# Patient Record
Sex: Female | Born: 1955 | Race: White | Hispanic: No | Marital: Married | Smoking: Former smoker
Health system: Southern US, Community
[De-identification: ages and names within clinical notes are randomized; demographics above are authoritative.]

## PROBLEM LIST (undated history)

## (undated) DIAGNOSIS — K279 Peptic ulcer, site unspecified, unspecified as acute or chronic, without hemorrhage or perforation: Secondary | ICD-10-CM

## (undated) DIAGNOSIS — A0472 Enterocolitis due to Clostridium difficile, not specified as recurrent: Secondary | ICD-10-CM

## (undated) DIAGNOSIS — M419 Scoliosis, unspecified: Secondary | ICD-10-CM

## (undated) DIAGNOSIS — K255 Chronic or unspecified gastric ulcer with perforation: Secondary | ICD-10-CM

## (undated) DIAGNOSIS — M1990 Unspecified osteoarthritis, unspecified site: Secondary | ICD-10-CM

## (undated) DIAGNOSIS — I824Z3 Acute embolism and thrombosis of unspecified deep veins of distal lower extremity, bilateral: Secondary | ICD-10-CM

## (undated) DIAGNOSIS — G10 Huntington's disease: Secondary | ICD-10-CM

## (undated) HISTORY — DX: Acute embolism and thrombosis of unspecified deep veins of distal lower extremity, bilateral: I82.4Z3

## (undated) HISTORY — PX: EYE SURGERY: SHX253

## (undated) HISTORY — DX: Enterocolitis due to Clostridium difficile, not specified as recurrent: A04.72

## (undated) HISTORY — DX: Peptic ulcer, site unspecified, unspecified as acute or chronic, without hemorrhage or perforation: K27.9

## (undated) HISTORY — DX: Huntington's disease: G10

## (undated) HISTORY — DX: Chronic or unspecified gastric ulcer with perforation: K25.5

## (undated) HISTORY — PX: COLONOSCOPY W/ POLYPECTOMY: SHX1380

## (undated) HISTORY — PX: BACK SURGERY: SHX140

## (undated) HISTORY — PX: OTHER SURGICAL HISTORY: SHX169

---
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
## 1975-04-10 HISTORY — PX: OOPHORECTOMY: SHX86

## 1984-04-09 HISTORY — PX: TUBAL LIGATION: SHX77

## 1985-04-09 HISTORY — PX: BREAST SURGERY: SHX581

## 2003-01-08 LAB — HM COLONOSCOPY: HM Colonoscopy: NORMAL

## 2003-04-10 HISTORY — PX: OTHER SURGICAL HISTORY: SHX169

## 2004-12-29 ENCOUNTER — Emergency Department (HOSPITAL_COMMUNITY): Admission: EM | Admit: 2004-12-29 | Discharge: 2004-12-29 | Payer: Self-pay | Admitting: Emergency Medicine

## 2005-04-09 LAB — CONVERTED CEMR LAB: Pap Smear: ABNORMAL

## 2009-01-27 ENCOUNTER — Ambulatory Visit: Payer: Self-pay | Admitting: Family Medicine

## 2009-01-27 DIAGNOSIS — H9190 Unspecified hearing loss, unspecified ear: Secondary | ICD-10-CM | POA: Insufficient documentation

## 2009-01-27 DIAGNOSIS — H6120 Impacted cerumen, unspecified ear: Secondary | ICD-10-CM | POA: Insufficient documentation

## 2009-02-08 ENCOUNTER — Encounter: Payer: Self-pay | Admitting: *Deleted

## 2009-04-22 ENCOUNTER — Ambulatory Visit: Payer: Self-pay | Admitting: Family Medicine

## 2009-04-22 DIAGNOSIS — J069 Acute upper respiratory infection, unspecified: Secondary | ICD-10-CM | POA: Insufficient documentation

## 2009-04-22 DIAGNOSIS — J029 Acute pharyngitis, unspecified: Secondary | ICD-10-CM | POA: Insufficient documentation

## 2009-04-22 LAB — CONVERTED CEMR LAB: Rapid Strep: NEGATIVE

## 2010-05-09 NOTE — Assessment & Plan Note (Signed)
Summary: FLU LIKE SYMPTOMS/PER DR BURCHETTE/CJR   Vital Signs:  Patient profile:   55 year old female Menstrual status:  postmenopausal Temp:     98.8 degrees F oral BP sitting:   160 / 90  (left arm) Cuff size:   regular  Vitals Entered By: Sid Falcon LPN (April 22, 2009 3:33 PM) CC: Sore throat, cough, congestion   History of Present Illness: Onset yesterday flulike symptoms body aches, productive cough and sore throat. No definite fever. Husband had somewhat similar symptoms. Patient denies any nausea, vomiting or, or abdominal pain. No rashes. Has not tried any over-the-counter cough medications. Ex-smoker. Allergy to penicillin  Allergies: 1)  Penicillin V Potassium (Penicillin V Potassium) 2)  Ferrous Sulfate (Ferrous Sulfate) 3)  * Bee Stings  Past History:  Past Medical History: Last updated: 01/27/2009 UTI Hx endometriosis  Past Surgical History: Last updated: 01/27/2009 Breast biopsy 1989 Appendectomy 1963 Tonsillectomy 1958 Left ovary removed 1977 (Endometriosis) Oral surgery implants 2009-2010  Review of Systems      See HPI  Physical Exam  General:  Well-developed,well-nourished,in no acute distress; alert,appropriate and cooperative throughout examination Ears:  minimal cerumen in ear canals otherwise clear Nose:  External nasal examination shows no deformity or inflammation. Nasal mucosa are pink and moist without lesions or exudates. Mouth:  Oral mucosa and oropharynx without lesions or exudates.  Teeth in good repair. Neck:  No deformities, masses, or tenderness noted. Lungs:  Normal respiratory effort, chest expands symmetrically. Lungs are clear to auscultation, no crackles or wheezes. Heart:  normal rate, regular rhythm, and no murmur.   Skin:  no rash   Impression & Recommendations:  Problem # 1:  VIRAL URI (ICD-465.9) Rapid strep neg.  Plenty of fluids and rest.  Other Orders: Rapid Strep ([id number])  Patient Instructions: 1)  Get  plenty of rest, drink lots of clear liquids, and use Tylenol or Ibuprofen for fever and comfort. Return in 7-10 days if you're not better: sooner if you'er feeling worse.   Laboratory Results    Other Tests  Rapid Strep: negative Comments: Joanne Chars CMA  April 22, 2009 3:47 PM

## 2010-05-09 NOTE — Assessment & Plan Note (Signed)
Summary: EST [place]//CCM   Vital Signs:  Patient profile:   55 year old female Menstrual status:  postmenopausal Height:      64 inches Weight:      148 pounds BMI:     25.50 Temp:     98.2 degrees F oral Pulse rate:   72 / minute Pulse rhythm:   regular Resp:     12 per minute BP sitting:   169 / 90  (left arm) Cuff size:   regular  Vitals Entered By: Sid Falcon LPN (January 27, 2009 1:21 PM)  Serial Vital Signs/Assessments:  Time      Position  BP       Pulse  Resp  Temp     By                     140/80                         Evelena Peat MD  CC: New pt to establish, from [place], hearing problems     Menstrual Status postmenopausal Last PAP Result abnormal   History of Present Illness: New patient visit. Maj. issue is decreased hearing bilaterally past 3 weeks. Did have some mild vertigo a few weeks ago that is clear. Never had any associated nausea or vomiting. Had some recent dental work with implants right upper molars. Hearing changes seem to correlate with that surgery.  Past medical history reviewed. History of endometriosis. Previous left ovariectomy secondary endometriosis. Also had prior appendectomy and negative breast biopsy in 1989. Allergic to penicillin and sulfa. No regular medications.  Patient has previously smoked but not for several years. Occasional alcohol use. She is married. Works in Research officer, political party.  Preventive Screening-Counseling & Management  Alcohol-Tobacco     Smoking Status: quit     Year Quit: 2000  Allergies (verified): 1)  Penicillin V Potassium (Penicillin V Potassium) 2)  Ferrous Sulfate (Ferrous Sulfate) 3)  * Bee Stings  Past History:  Family History: Last updated: 01/27/2009 Family History of Arthritis Family History of Prostate CA  Bronchial cancer Family history heart disease  Social History: Last updated: 01/27/2009 Occupation:  Counsellor Married Alcohol use-yes Past smoker, quit  2000  Risk Factors: Smoking Status: quit (01/27/2009)  Past Medical History: UTI Hx endometriosis  Past Surgical History: Breast biopsy 1989 Appendectomy 1963 Tonsillectomy 1958 Left ovary removed 1977 (Endometriosis) Oral surgery implants 2009-2010  Family History: Family History of Arthritis Family History of Prostate CA  Bronchial cancer Family history heart disease  Social History: Occupation:  Counsellor Married Alcohol use-yes Past smoker, quit 2000 Smoking Status:  quit Occupation:  employed  Review of Systems  The patient denies anorexia, fever, weight loss, weight gain, vision loss, hoarseness, chest pain, dyspnea on exertion, prolonged cough, and headaches.    Physical Exam  General:  Well-developed,well-nourished,in no acute distress; alert,appropriate and cooperative throughout examination Head:  Normocephalic and atraumatic without obvious abnormalities. No apparent alopecia or balding. Ears:  cerumen impaction left canal. Right ear canal and eardrum are normal in appearance Nose:  External nasal examination shows no deformity or inflammation. Nasal mucosa are pink and moist without lesions or exudates. Mouth:  Oral mucosa and oropharynx without lesions or exudates.  Teeth in good repair. Neck:  No deformities, masses, or tenderness noted. Lungs:  Normal respiratory effort, chest expands symmetrically. Lungs are clear to auscultation, no crackles or wheezes. Heart:  Normal rate and regular rhythm. S1 and S2 normal without gallop, murmur, click, rub or other extra sounds.   Impression & Recommendations:  Problem # 1:  UNSPECIFIED HEARING LOSS (ICD-389.9) patient noted prompt improvement in hearing after irrigation left canal. Observe for now. If further concerns audiometry screening.  Problem # 2:  IMPACTED CERUMEN (ICD-380.4) left ear canal irrigated.  Preventive Care Screening  Pap Smear:    Date:  04/09/2005    Results:  abnormal    Last Tetanus Booster:    Date:  04/09/2002    Results:  Historical   Colonoscopy:    Date:  04/09/2002    Results:  normal

## 2010-05-09 NOTE — Miscellaneous (Signed)
Summary: Immunization Entry   Immunization History:  Pneumovax Immunization History:    Pneumovax:  historical (02/07/2006)   Immunization History:  Pneumovax Immunization History:    Pneumovax:  historical (02/07/2006)

## 2010-10-09 ENCOUNTER — Other Ambulatory Visit: Payer: Self-pay | Admitting: Orthopedic Surgery

## 2010-10-09 DIAGNOSIS — M545 Low back pain, unspecified: Secondary | ICD-10-CM

## 2010-10-10 ENCOUNTER — Ambulatory Visit
Admission: RE | Admit: 2010-10-10 | Discharge: 2010-10-10 | Disposition: A | Discharge status: Home / Self Care | Payer: Federal, State, Local not specified - PPO | Source: Ambulatory Visit | Attending: Orthopedic Surgery | Admitting: Orthopedic Surgery

## 2010-10-10 DIAGNOSIS — M545 Low back pain, unspecified: Secondary | ICD-10-CM

## 2010-11-14 ENCOUNTER — Encounter: Payer: Self-pay | Admitting: Family Medicine

## 2010-11-15 ENCOUNTER — Ambulatory Visit: Payer: Federal, State, Local not specified - PPO | Admitting: Family Medicine

## 2011-01-17 ENCOUNTER — Ambulatory Visit: Payer: Federal, State, Local not specified - PPO | Admitting: Family Medicine

## 2011-01-18 ENCOUNTER — Ambulatory Visit (INDEPENDENT_AMBULATORY_CARE_PROVIDER_SITE_OTHER): Payer: Federal, State, Local not specified - PPO | Admitting: Family Medicine

## 2011-01-18 ENCOUNTER — Encounter: Payer: Self-pay | Admitting: Family Medicine

## 2011-01-18 VITALS — BP 128/88 | Temp 98.1°F | Wt 158.0 lb

## 2011-01-18 DIAGNOSIS — M6740 Ganglion, unspecified site: Secondary | ICD-10-CM

## 2011-01-18 NOTE — Patient Instructions (Signed)
Ganglion A ganglion is a swelling under the skin that is filled with a thick, jelly-like substance. It is a synovial cyst. This is caused by a break (rupture) of the joint lining from the joint space. A ganglion often occurs near an area of repeated minor trauma (damage caused by an accident). Trauma may also be a repetitive movement at work or in a sport. TREATMENT It often goes away without treatment. It may reappear later. Sometimes a ganglion may need to be surgically removed. Often they are drained and injected with a steroid. Sometimes they respond to:  Rest.   Occasionally splinting helps.  HOME CARE INSTRUCTIONS  Your caregiver will decide the best way of treating your ganglion. Do not try to break the ganglion yourself by pressing on it, poking it with a needle, or hitting it with a heavy object.   Use medications as directed.  1.  2. SEEK MEDICAL CARE IF:   The ganglion becomes larger or more painful.   You have increased redness or swelling.   You have weakness or numbness in your hand or wrist.  MAKE SURE YOU:   Understand these instructions.   Will watch your condition.   Will get help right away if you are not doing well or get worse.  Document Released: 03/23/2000 Document Re-Released: 06/22/2008 [employer] Patient Information 2011 [employer]. 

## 2011-01-18 NOTE — Progress Notes (Signed)
  Subjective:    Patient ID: Natasha Frederick, female    DOB: Jun 20, 1955, 55 y.o.   MRN: [id number]  HPI Cystic lesion right middle finger. Dorsal location - DIP joint. Nonpainful. First noted several weeks ago. No injury. No loss of range of motion. Patient denies any other cystic lesions. She's had some ongoing back difficulties. Followed by orthopedist. Leanora Ivanoff Motrin 800 mg frequently.  She is doing some exercises.   Review of Systems  Constitutional: Negative for fever, chills, appetite change and unexpected weight change.  Respiratory: Negative for cough.   Cardiovascular: Negative for chest pain.  Hematological: Negative for adenopathy.       Objective:   Physical Exam  Constitutional: She appears well-developed and well-nourished.  Cardiovascular: Normal rate and regular rhythm.   Pulmonary/Chest: Effort normal and breath sounds normal. No respiratory distress. She has no wheezes. She has no rales.  Musculoskeletal:       Right middle finger reveals cystic mobile nontender lesion over the DIP joint. This is approximately 1/2 cm diameter. No overlying skin changes. Full range of motion DIP and PIP joints of right middle finger          Assessment & Plan:  Benign ganglion cyst right middle finger. Reassurance. No intervention at this time

## 2011-01-31 ENCOUNTER — Ambulatory Visit (INDEPENDENT_AMBULATORY_CARE_PROVIDER_SITE_OTHER): Payer: Federal, State, Local not specified - PPO | Admitting: Family Medicine

## 2011-01-31 DIAGNOSIS — Z23 Encounter for immunization: Secondary | ICD-10-CM

## 2011-02-01 ENCOUNTER — Ambulatory Visit (INDEPENDENT_AMBULATORY_CARE_PROVIDER_SITE_OTHER): Payer: Federal, State, Local not specified - PPO | Admitting: Family Medicine

## 2011-02-01 ENCOUNTER — Encounter: Payer: Self-pay | Admitting: Family Medicine

## 2011-02-01 VITALS — BP 122/78 | Temp 98.0°F | Wt 156.0 lb

## 2011-02-01 DIAGNOSIS — H00019 Hordeolum externum unspecified eye, unspecified eyelid: Secondary | ICD-10-CM

## 2011-02-01 MED ORDER — TOBRAMYCIN 0.3 % OP OINT: TOPICAL_OINTMENT | Freq: Two times a day (BID) | OPHTHALMIC | Status: AC

## 2011-02-01 NOTE — Patient Instructions (Signed)
Sty A sty (hordeolum) is an infection of a gland in the eyelid located at the base of the eyelash. A sty may develop a white or yellow head of pus. It can be puffy (swollen). Usually, the sty will burst and pus will come out on its own. They do not leave lumps in the eyelid once they drain. A sty is often confused with another form of cyst of the eyelid called a chalazion. Chalazions occur within the eyelid and not on the edge where the bases of the eyelashes are. They often are red, sore and then form firm lumps in the eyelid. CAUSES   Germs (bacteria).   Lasting (chronic) eyelid inflammation.  SYMPTOMS   Tenderness, redness and swelling along the edge of the eyelid at the base of the eyelashes.   Sometimes, there is a white or yellow head of pus. It may or may not drain.  DIAGNOSIS  An ophthalmologist will be able to distinguish between a sty and a chalazion and treat the condition appropriately.  TREATMENT   Styes are typically treated with warm packs (compresses) until drainage occurs.   In rare cases, medicines that kill germs (antibiotics) may be prescribed. These antibiotics may be in the form of drops, cream or pills.   If a hard lump has formed, it is generally necessary to do a small incision and remove the hardened contents of the cyst in a minor surgical procedure done in the office.   In suspicious cases, your caregiver may send the contents of the cyst to the lab to be certain that it is not a rare, but dangerous form of cancer of the glands of the eyelid.  HOME CARE INSTRUCTIONS   Wash your hands often and dry them with a clean towel. Avoid touching your eyelid. This may spread the infection to other parts of the eye.   Apply heat to your eyelid for 10 to 20 minutes, several times a day, to ease pain and help to heal it faster.   Do not squeeze the sty. Allow it to drain on its own. Wash your eyelid carefully 3 to 4 times per day to remove any pus.  SEEK IMMEDIATE  MEDICAL CARE IF:   Your eye becomes painful or puffy (swollen).   Your vision changes.   Your sty does not drain by itself within 3 days.   Your sty comes back within a short period of time, even with treatment.   You have redness (inflammation) around the eye.   You have a fever.  Document Released: 01/03/2005 Document Revised: 12/06/2010 Document Reviewed: 09/07/2008 [facility] Patient Information 2012 [place], [place].

## 2011-02-01 NOTE — Progress Notes (Signed)
  Subjective:    Patient ID: Natasha Frederick, female    DOB: 1955/11/25, 55 y.o.   MRN: [id number]  HPI  Acute visit. Possible sty left lower lid. Onset yesterday. History of similar process right lower lid last month. She started warm compresses. No drainage. No visual changes. Minimal pain. No history of herpes. Denies fever or chills.  Review of Systems  Constitutional: Negative for fever and chills.  Eyes: Negative for pain, discharge and visual disturbance.       Objective:   Physical Exam  Constitutional: She appears well-developed and well-nourished.  Eyes: Pupils are equal, round, and reactive to light.       Left lower lateral eyelid small area of erythema approximately 1 mm. No clear pustules. No vesicles. Nontender. Otherwise conjunctiva appears normal. Cornea appears normal  Cardiovascular: Normal rate and regular rhythm.           Assessment & Plan:  Probable early stye left lower lateral lid. Warm compresses several times daily.  Tobrex ointment 3 times a day. Touch base next week if no better

## 2011-06-14 ENCOUNTER — Encounter: Payer: Self-pay | Admitting: Family Medicine

## 2011-06-14 ENCOUNTER — Ambulatory Visit (INDEPENDENT_AMBULATORY_CARE_PROVIDER_SITE_OTHER): Payer: Federal, State, Local not specified - PPO | Admitting: Family Medicine

## 2011-06-14 VITALS — BP 130/80 | Temp 97.8°F | Wt 158.0 lb

## 2011-06-14 DIAGNOSIS — H8110 Benign paroxysmal vertigo, unspecified ear: Secondary | ICD-10-CM

## 2011-06-14 NOTE — Patient Instructions (Signed)

## 2011-06-14 NOTE — Progress Notes (Signed)
  Subjective:    Patient ID: Natasha Frederick, female    DOB: June 01, 1955, 56 y.o.   MRN: [id number]  HPI  Acute visit for dizziness. Patient thought this was due to high blood pressure. Went to [facility] pharmacy yesterday and blood pressure 172/110. She's never been diagnosed with hypertension previously. She had questions about accuracy of cuff. Denies any headaches. Her dizziness started 3 days ago and is described as vertigo usually very transient early morning and it goes away completely after several minutes. She has not had any syncope or presyncope. No visual changes. No speech difficulties. No dysphagia. No headaches. No focal weakness. No history of similar symptoms.  Mild nausea but no vomiting. Symptoms are not progressive.  Worse with head movement.  No alleviating factors.   Past Medical History  Diagnosis Date  . Endometriosis    Past Surgical History  Procedure Date  . Breast surgery 1989    bx  . Appendectomy 1963  . Tonsillectomy 1958  . Oophorectomy     left    reports that she quit smoking about 17 years ago. Her smoking use included Cigarettes. She has a 8 pack-year smoking history. She does not have any smokeless tobacco history on file. Her alcohol and drug histories not on file. family history is negative for Arthritis, and Cancer, and Heart disease, . Allergies  Allergen Reactions  . Ferrous Sulfate     REACTION: hives  . Penicillins     REACTION: hives     Review of Systems  Constitutional: Negative for fever, chills, appetite change and unexpected weight change.  Respiratory: Negative for cough and shortness of breath.   Cardiovascular: Negative for chest pain.  Neurological: Positive for dizziness. Negative for tremors, seizures, syncope, facial asymmetry, speech difficulty, weakness, light-headedness, numbness and headaches.  Hematological: Negative for adenopathy.  Psychiatric/Behavioral: Negative for confusion.       Objective:   Physical Exam    Constitutional: She is oriented to person, place, and time. She appears well-developed and well-nourished.  HENT:  Mouth/Throat: Oropharynx is clear and moist.  Neck: Neck supple. No thyromegaly present.  Cardiovascular: Normal rate and regular rhythm.   Pulmonary/Chest: Effort normal and breath sounds normal. No respiratory distress. She has no wheezes. She has no rales.  Neurological: She is alert and oriented to person, place, and time. No cranial nerve deficit.       Cerebellar function normal. No focal weakness. Gait normal  No nystagmus  Psychiatric: She has a normal mood and affect. Her behavior is normal. Judgment and thought content normal.          Assessment & Plan:  Benign positional vertigo. Handout given. Consider repositioning maneuvers if not resolving in one to 2 weeks. She does not have elevated blood pressure today and we do not suspect elevated blood pressure has anything to do with her current symptoms

## 2012-08-18 ENCOUNTER — Telehealth: Payer: Self-pay | Admitting: Family Medicine

## 2012-08-18 NOTE — Telephone Encounter (Signed)
Error/njr °

## 2013-03-10 ENCOUNTER — Encounter: Payer: Self-pay | Admitting: Family Medicine

## 2013-03-10 ENCOUNTER — Ambulatory Visit (INDEPENDENT_AMBULATORY_CARE_PROVIDER_SITE_OTHER): Payer: Federal, State, Local not specified - PPO | Admitting: Family Medicine

## 2013-03-10 VITALS — BP 132/76 | HR 73 | Temp 98.0°F | Wt 158.0 lb

## 2013-03-10 DIAGNOSIS — Z23 Encounter for immunization: Secondary | ICD-10-CM

## 2013-03-10 DIAGNOSIS — R21 Rash and other nonspecific skin eruption: Secondary | ICD-10-CM

## 2013-03-10 MED ORDER — IBUPROFEN 800 MG PO TABS: 800.0000 mg | ORAL_TABLET | Freq: Three times a day (TID) | ORAL | Status: DC | PRN

## 2013-03-10 MED ORDER — METHYLPREDNISOLONE ACETATE 80 MG/ML IJ SUSP
80.0000 mg | Freq: Once | INTRAMUSCULAR | Status: AC
  Administered 2013-03-10: 80 mg via INTRAMUSCULAR

## 2013-03-10 MED ORDER — TRIAMCINOLONE ACETONIDE 0.1 % EX CREA: 1.0000 "application " | TOPICAL_CREAM | Freq: Two times a day (BID) | CUTANEOUS | Status: DC

## 2013-03-10 NOTE — Progress Notes (Signed)
   Subjective:    Patient ID: Natasha Frederick, female    DOB: 1956-02-15, 57 y.o.   MRN: [id number]  HPI Patient seen with pruritic rash upper anterior chest off and on since July. She's been using Bactroban ointment off and on and initially thought this helped. She then tried some fluorouracil topical given to her by podiatrist for her foot problem and this irritated worse. She does not describe any other areas of rash. No history of jewelry or metal allergy. Rash is exacerbated by heat. Her rash is pruritic and nonpainful.  She did have some initial involvement of her anterior neck but now mostly upper chest region.  Past Medical History  Diagnosis Date  . Endometriosis    Past Surgical History  Procedure Laterality Date  . Breast surgery  1989    bx  . Appendectomy  1963  . Tonsillectomy  1958  . Oophorectomy      left    reports that she quit smoking about 19 years ago. Her smoking use included Cigarettes. She has a 8 pack-year smoking history. She does not have any smokeless tobacco history on file. Her alcohol and drug histories are not on file. family history is negative for Arthritis, Cancer, and Heart disease. Allergies  Allergen Reactions  . Ferrous Sulfate     REACTION: hives  . Penicillins     REACTION: hives       Review of Systems  Constitutional: Negative for fever and chills.  Skin: Positive for rash.       Objective:   Physical Exam  Constitutional: She appears well-developed and well-nourished.  Cardiovascular: Normal rate and regular rhythm.   Pulmonary/Chest: Effort normal and breath sounds normal. No respiratory distress. She has no wheezes. She has no rales.  Skin: Rash noted.  Patient has somewhat excoriated erythematous rash upper anterior chest. No pustules. Non-scaly. No vesicles. Rash covers an area of approximately 12 of 12 cm          Assessment & Plan:  Nonspecific rash upper anterior chest. Suspect possible contact type dermatitis.  She tried some fluorouracil which was given to her by the podiatrist and this made rash much worse. Leave off all other topicals. Triamcinolone 0.1% cream twice a day. Avoid scratching. Depo-Medrol 80 mg IM given. Reassess 2 weeks.

## 2013-03-10 NOTE — Patient Instructions (Signed)
Leave off all topicals other than prescribed. Avoid scratching as much as possible.

## 2013-03-10 NOTE — Progress Notes (Signed)
Pre visit review using our clinic review tool, if applicable. No additional management support is needed unless otherwise documented below in the visit note. 

## 2013-03-27 ENCOUNTER — Encounter: Payer: Self-pay | Admitting: Family Medicine

## 2013-03-27 ENCOUNTER — Ambulatory Visit (INDEPENDENT_AMBULATORY_CARE_PROVIDER_SITE_OTHER): Payer: Federal, State, Local not specified - PPO | Admitting: Family Medicine

## 2013-03-27 VITALS — BP 130/80 | HR 64 | Temp 98.6°F | Wt 158.0 lb

## 2013-03-27 DIAGNOSIS — L821 Other seborrheic keratosis: Secondary | ICD-10-CM

## 2013-03-27 DIAGNOSIS — R21 Rash and other nonspecific skin eruption: Secondary | ICD-10-CM

## 2013-03-27 DIAGNOSIS — L988 Other specified disorders of the skin and subcutaneous tissue: Secondary | ICD-10-CM

## 2013-03-27 MED ORDER — MUPIROCIN 2 % EX OINT: TOPICAL_OINTMENT | CUTANEOUS | Status: DC

## 2013-03-27 NOTE — Progress Notes (Signed)
Pre visit review using our clinic review tool, if applicable. No additional management support is needed unless otherwise documented below in the visit note. 

## 2013-03-27 NOTE — Progress Notes (Signed)
   Subjective:    Patient ID: Natasha Frederick, female    DOB: 1955-10-03, 57 y.o.   MRN: [id number]  HPI Patient is here for followup regarding anterior chest rash. Unknown precipitant. We questioned whether she could be having some type of metal allergy such as nickel. She was given Depo-Medrol and triamcinolone cream and symptoms are improving. She has a couple of irritated areas where she had used on her own fluorouracil. She has seen improvement with the Depo-Medrol and triamcinolone. Still has very mild itching.  She has separate issue of hyperkeratotic verrucous lesion left thoracic area under her bra. This is irritated because of location. She requests treatment.  Past Medical History  Diagnosis Date  . Endometriosis    Past Surgical History  Procedure Laterality Date  . Breast surgery  1989    bx  . Appendectomy  1963  . Tonsillectomy  1958  . Oophorectomy      left    reports that she quit smoking about 19 years ago. Her smoking use included Cigarettes. She has a 8 pack-year smoking history. She does not have any smokeless tobacco history on file. Her alcohol and drug histories are not on file. family history is negative for Arthritis, Cancer, and Heart disease. Allergies  Allergen Reactions  . Ferrous Sulfate     REACTION: hives  . Penicillins     REACTION: hives      Review of Systems  Constitutional: Negative for fever and chills.  Skin: Positive for rash.       Objective:   Physical Exam  Constitutional: She appears well-developed and well-nourished.  Cardiovascular: Normal rate.   Pulmonary/Chest: Effort normal and breath sounds normal. No respiratory distress. She has no wheezes. She has no rales.  Skin: Rash noted.  Patient has hyperkeratotic verrucous-type lesion somewhat irritated left thoracic area under her bra line. This is about 4 mm to 5 mm diameter at the base. No irregular features  Patient has nonspecific mild erythema anterior chest mostly  surrounding areas of previous skin irritation from 5 fluorouracil. Nontender. No cellulitis changes.          Assessment & Plan:  #1 nonspecific rash anterior chest. Question contact dermatitis. Improving. Continue triamcinolone cream as needed #2 probable irritated verruccous keratosis left back. Discussed risk and benefits of treatment with liquid nitrogen and patient consented. Treated without difficulty.  If not fully resolving in 3 weeks would recommend shave excision

## 2013-03-30 ENCOUNTER — Telehealth: Payer: Self-pay | Admitting: Family Medicine

## 2013-03-30 NOTE — Telephone Encounter (Signed)
Can expect some mild surrounding redness and discoloration as skin tags dies.  Let us see her if redness progressing.

## 2013-03-30 NOTE — Telephone Encounter (Signed)
Patient Information:  Caller Name: [place]  Phone: [phone]  Patient: Natasha Frederick, Natasha Frederick  Gender: Female  DOB: [birth year]  Age: 57 Years  PCP: Evelena Peat [facility])  Office Follow Up:  Does the office need to follow up with this patient?: No  Instructions For The Office: N/A  RN Note:  RN discussed care advice with call back parameters.  Symptoms  Reason For Call & Symptoms: Today, 03/30/2013, pt calling stating she had skin tag removed  from  left chest area/ bra line at the office on 03/27/2013.  She has been applying Mupricin ointment BID.  The site is brown  colored with some reddness  --  1/8 inch around the site. No drainage. NO fever.  Reviewed Health History In EMR: Yes  Reviewed Medications In EMR: Yes  Reviewed Allergies In EMR: Yes  Reviewed Surgeries / Procedures: Yes  Date of Onset of Symptoms: 03/27/2013  Guideline(s) Used:  Puncture Wound  Wound Infection  Disposition Per Guideline:   Home Care  Reason For Disposition Reached:   Wound doesn't sound infected, or only mild redness  Advice Given:  Antibiotic Ointment:  Apply an antibiotic ointment 3 times a day. If the area could become dirty, cover with a Band-Aid or a clean gauze dressing.  Patient Will Follow Care Advice:  YES

## 2013-03-31 NOTE — Telephone Encounter (Signed)
Pt informed

## 2013-05-04 ENCOUNTER — Other Ambulatory Visit: Payer: Self-pay | Admitting: Family Medicine

## 2013-05-08 ENCOUNTER — Encounter: Payer: Self-pay | Admitting: Family Medicine

## 2013-05-08 ENCOUNTER — Ambulatory Visit (INDEPENDENT_AMBULATORY_CARE_PROVIDER_SITE_OTHER): Payer: Federal, State, Local not specified - PPO | Admitting: Family Medicine

## 2013-05-08 VITALS — BP 120/68 | HR 83 | Temp 97.9°F | Wt 163.0 lb

## 2013-05-08 DIAGNOSIS — R21 Rash and other nonspecific skin eruption: Secondary | ICD-10-CM

## 2013-05-08 DIAGNOSIS — I781 Nevus, non-neoplastic: Secondary | ICD-10-CM

## 2013-05-08 DIAGNOSIS — I789 Disease of capillaries, unspecified: Secondary | ICD-10-CM

## 2013-05-08 LAB — CBC WITH DIFFERENTIAL/PLATELET
Basophils Absolute: 0 10*3/uL (ref 0.0–0.1)
Basophils Relative: 0 % (ref 0–1)
Eosinophils Absolute: 0.2 10*3/uL (ref 0.0–0.7)
Eosinophils Relative: 3 % (ref 0–5)
HCT: 36.8 % (ref 36.0–46.0)
Hemoglobin: 12.6 g/dL (ref 12.0–15.0)
Lymphocytes Relative: 16 % (ref 12–46)
Lymphs Abs: 1.2 10*3/uL (ref 0.7–4.0)
MCH: 30.8 pg (ref 26.0–34.0)
MCHC: 34.2 g/dL (ref 30.0–36.0)
MCV: 90 fL (ref 78.0–100.0)
Monocytes Absolute: 0.5 10*3/uL (ref 0.1–1.0)
Monocytes Relative: 7 % (ref 3–12)
Neutro Abs: 5.7 10*3/uL (ref 1.7–7.7)
Neutrophils Relative %: 74 % (ref 43–77)
Platelets: 274 10*3/uL (ref 150–400)
RBC: 4.09 MIL/uL (ref 3.87–5.11)
RDW: 13.4 % (ref 11.5–15.5)
WBC: 7.6 10*3/uL (ref 4.0–10.5)

## 2013-05-08 LAB — SEDIMENTATION RATE: Sed Rate: 4 mm/hr (ref 0–22)

## 2013-05-08 LAB — CK: Total CK: 80 U/L (ref 7–177)

## 2013-05-08 MED ORDER — METHYLPREDNISOLONE ACETATE 80 MG/ML IJ SUSP
80.0000 mg | Freq: Once | INTRAMUSCULAR | Status: AC
  Administered 2013-05-08: 80 mg via INTRAMUSCULAR

## 2013-05-08 NOTE — Progress Notes (Signed)
Pre visit review using our clinic review tool, if applicable. No additional management support is needed unless otherwise documented below in the visit note. 

## 2013-05-08 NOTE — Progress Notes (Signed)
   Subjective:    Patient ID: Natasha Frederick, female    DOB: 06/19/1955, 58 y.o.   MRN: [id number]  HPI Patient seen with anterior chest rash. Present for few months. We initially thought this represented some type of contact dermatitis. She was given intramuscular steroids which helped and also topical steroids. She continues to use topical steroids. She describes more of a burning sensation than itching. Steroids did seem to help. She denies any other rash whatsoever. No recent fevers or chills. No arthralgias.  Patient denies any myositis symptoms or muscle weakness. She denies any facial rash.  No clear exacerbating factors. She does not use any type of  jewelry in this region.  Past Medical History  Diagnosis Date  . Endometriosis    Past Surgical History  Procedure Laterality Date  . Breast surgery  1989    bx  . Appendectomy  1963  . Tonsillectomy  1958  . Oophorectomy      left    reports that she quit smoking about 19 years ago. Her smoking use included Cigarettes. She has a 8 pack-year smoking history. She does not have any smokeless tobacco history on file. Her alcohol and drug histories are not on file. family history is negative for Arthritis, Cancer, and Heart disease. Allergies  Allergen Reactions  . Ferrous Sulfate     REACTION: hives  . Penicillins     REACTION: hives      Review of Systems  Constitutional: Negative for fever and chills.  Respiratory: Negative for cough and shortness of breath.   Cardiovascular: Negative for chest pain.  Musculoskeletal: Negative for arthralgias and myalgias.  Hematological: Negative for adenopathy.       Objective:   Physical Exam  Constitutional: She appears well-developed and well-nourished. No distress.  Cardiovascular: Normal rate.   Pulmonary/Chest: Effort normal and breath sounds normal. No respiratory distress. She has no wheezes. She has no rales.  Skin: Rash noted.  Patient has a fairly diffuse erythematous  rash anterior chest. On close inspection she has some telangiectasias which blanch with pressure. Nonscaly. Her skin is slightly dry. No vesicles. No pustules.          Assessment & Plan:  Nonspecific anterior chest wall rash.  On closer inspection, mostly telangiectasias. Clinically, she does not have features to suggest dermatomyositis such as muscle weakness or pain but will check labs to rule out.  She responded previously to depo-medrol and 80 mg IM given.

## 2013-05-09 LAB — TSH: TSH: 2.07 u[IU]/mL (ref 0.350–4.500)

## 2013-05-09 LAB — HIGH SENSITIVITY CRP: CRP, High Sensitivity: 1.3 mg/L

## 2013-05-13 ENCOUNTER — Other Ambulatory Visit: Payer: Self-pay | Admitting: Family Medicine

## 2013-06-15 ENCOUNTER — Ambulatory Visit (INDEPENDENT_AMBULATORY_CARE_PROVIDER_SITE_OTHER): Payer: Federal, State, Local not specified - PPO | Admitting: Family Medicine

## 2013-06-15 ENCOUNTER — Encounter: Payer: Self-pay | Admitting: Family Medicine

## 2013-06-15 VITALS — BP 126/70 | HR 82 | Wt 161.0 lb

## 2013-06-15 DIAGNOSIS — R21 Rash and other nonspecific skin eruption: Secondary | ICD-10-CM

## 2013-06-15 MED ORDER — SCOPOLAMINE 1 MG/3DAYS TD PT72: 1.0000 | MEDICATED_PATCH | TRANSDERMAL | Status: DC

## 2013-06-15 NOTE — Progress Notes (Signed)
Pre visit review using our clinic review tool, if applicable. No additional management support is needed unless otherwise documented below in the visit note. 

## 2013-06-15 NOTE — Progress Notes (Signed)
   Subjective:    Patient ID: Natasha Frederick, female    DOB: 11/13/1955, 58 y.o.   MRN: [id number]  Rash Pertinent negatives include no fever.   Patient seen with persistent anterior chest wall rash. Present for several months now. Initially we thought was is more contact dermatitis (initially had some more intense pruritus) and she seemed to have some partial response with steroids intramuscular and topical. Last visit we obtained some lab work including creatinine kinase, sedimentation rate, CRP, CBC and labs were all unremarkable. She has prominent telangiectasias anterior chest wall but no where else. These were initially somewhat pruritic and somewhat burning quality but asymptomatic now. She's tried to be cautious regarding sun exposure. She has not had any history of rosacea and denies any facial involvement. She's not had any clinical symptoms suggestive of dermatomyositis  Past Medical History  Diagnosis Date  . Endometriosis    Past Surgical History  Procedure Laterality Date  . Breast surgery  1989    bx  . Appendectomy  1963  . Tonsillectomy  1958  . Oophorectomy      left    reports that she quit smoking about 19 years ago. Her smoking use included Cigarettes. She has a 8 pack-year smoking history. She does not have any smokeless tobacco history on file. Her alcohol and drug histories are not on file. family history is negative for Arthritis, Cancer, and Heart disease. Allergies  Allergen Reactions  . Ferrous Sulfate     REACTION: hives  . Penicillins     REACTION: hives      Review of Systems  Constitutional: Negative for fever and chills.  Musculoskeletal: Negative for arthralgias.  Skin: Positive for rash.  Hematological: Negative for adenopathy.       Objective:   Physical Exam  Constitutional: She appears well-developed and well-nourished.  Cardiovascular: Normal rate.   Pulmonary/Chest: Effort normal and breath sounds normal. No respiratory distress. She  has no wheezes. She has no rales.  Skin: Rash noted.  Anterior chest wall reveals multiple superficial telangiectasias. She does not have any epidermal changes. Nontender to palpation. She does not have any facial involvement          Assessment & Plan:  Prominent telangiectasias anterior chest wall. She does not have any facial involvement to suggest rosacea. Set up dermatology referral

## 2013-06-15 NOTE — Patient Instructions (Signed)
We will call you with dermatology referral.

## 2013-10-15 ENCOUNTER — Other Ambulatory Visit: Payer: Self-pay | Admitting: Orthopedic Surgery

## 2013-10-15 DIAGNOSIS — M545 Low back pain: Secondary | ICD-10-CM

## 2013-10-15 DIAGNOSIS — M5442 Lumbago with sciatica, left side: Secondary | ICD-10-CM

## 2013-10-23 ENCOUNTER — Other Ambulatory Visit: Payer: Federal, State, Local not specified - PPO

## 2013-11-05 ENCOUNTER — Encounter: Payer: Self-pay | Admitting: Family Medicine

## 2013-11-05 ENCOUNTER — Ambulatory Visit (INDEPENDENT_AMBULATORY_CARE_PROVIDER_SITE_OTHER): Payer: Federal, State, Local not specified - PPO | Admitting: Family Medicine

## 2013-11-05 VITALS — BP 128/80 | HR 74 | Temp 97.7°F | Wt 150.0 lb

## 2013-11-05 DIAGNOSIS — J209 Acute bronchitis, unspecified: Secondary | ICD-10-CM

## 2013-11-05 MED ORDER — AZITHROMYCIN 250 MG PO TABS: ORAL_TABLET | ORAL | Status: AC

## 2013-11-05 NOTE — Progress Notes (Signed)
Pre visit review using our clinic review tool, if applicable. No additional management support is needed unless otherwise documented below in the visit note. 

## 2013-11-05 NOTE — Progress Notes (Signed)
   Subjective:    Patient ID: Natasha BeckmannJudith Torpey, female    DOB: 08/01/1955, 58 y.o.   MRN: [id number]  Cough Pertinent negatives include no chills or fever.   Acute visit. Patient seen with upper respiratory symptoms. Started over week ago. She has sore throat, nasal congestion cough productive of yellow-green sputum. No fever. Possibly some mild wheezing off and on. Husband with similar symptoms. Patient nonsmoker.  Past Medical History  Diagnosis Date  . Endometriosis    Past Surgical History  Procedure Laterality Date  . Breast surgery  1989    bx  . Appendectomy  1963  . Tonsillectomy  1958  . Oophorectomy      left    reports that she quit smoking about 19 years ago. Her smoking use included Cigarettes. She has a 8 pack-year smoking history. She does not have any smokeless tobacco history on file. Her alcohol and drug histories are not on file. family history is negative for Arthritis, Cancer, and Heart disease. Allergies  Allergen Reactions  . Ferrous Sulfate     REACTION: hives  . Penicillins     REACTION: hives    .   Review of Systems  Constitutional: Negative for fever and chills.  HENT: Positive for congestion.   Respiratory: Positive for cough.        Objective:   Physical Exam  Constitutional: She appears well-developed and well-nourished.  HENT:  Right Ear: External ear normal.  Left Ear: External ear normal.  Mouth/Throat: Oropharynx is clear and moist.  Neck: Neck supple.  Cardiovascular: Normal rate.   Pulmonary/Chest: Effort normal and breath sounds normal. No respiratory distress. She has no wheezes. She has no rales.  Lymphadenopathy:    She has no cervical adenopathy.          Assessment & Plan:  Acute bronchitis. We explained these are frequently viral. She's had progressive symptoms. Start Zithromax. Followup for any fever or worsening symptoms

## 2013-11-05 NOTE — Patient Instructions (Signed)

## 2013-11-09 ENCOUNTER — Ambulatory Visit
Admission: RE | Admit: 2013-11-09 | Discharge: 2013-11-09 | Disposition: A | Discharge status: Home / Self Care | Payer: Federal, State, Local not specified - PPO | Source: Ambulatory Visit | Attending: Orthopedic Surgery | Admitting: Orthopedic Surgery

## 2013-11-09 DIAGNOSIS — M5442 Lumbago with sciatica, left side: Secondary | ICD-10-CM

## 2013-11-25 ENCOUNTER — Other Ambulatory Visit: Payer: Self-pay | Admitting: Neurosurgery

## 2013-11-25 DIAGNOSIS — M4120 Other idiopathic scoliosis, site unspecified: Secondary | ICD-10-CM

## 2013-12-01 ENCOUNTER — Ambulatory Visit
Admission: RE | Admit: 2013-12-01 | Discharge: 2013-12-01 | Disposition: A | Discharge status: Home / Self Care | Payer: Federal, State, Local not specified - PPO | Source: Ambulatory Visit | Attending: Neurosurgery | Admitting: Neurosurgery

## 2013-12-01 DIAGNOSIS — M4120 Other idiopathic scoliosis, site unspecified: Secondary | ICD-10-CM

## 2013-12-15 ENCOUNTER — Other Ambulatory Visit: Payer: Self-pay | Admitting: Neurosurgery

## 2013-12-25 ENCOUNTER — Encounter (HOSPITAL_COMMUNITY): Payer: Self-pay | Admitting: Pharmacy Technician

## 2013-12-29 ENCOUNTER — Encounter (HOSPITAL_COMMUNITY): Payer: Self-pay

## 2013-12-29 ENCOUNTER — Encounter (HOSPITAL_COMMUNITY)
Admission: RE | Admit: 2013-12-29 | Discharge: 2013-12-29 | Disposition: A | Discharge status: Home / Self Care | Payer: Federal, State, Local not specified - PPO | Source: Ambulatory Visit | Attending: Neurosurgery | Admitting: Neurosurgery

## 2013-12-29 DIAGNOSIS — Z01812 Encounter for preprocedural laboratory examination: Secondary | ICD-10-CM | POA: Insufficient documentation

## 2013-12-29 DIAGNOSIS — M48061 Spinal stenosis, lumbar region without neurogenic claudication: Secondary | ICD-10-CM | POA: Diagnosis present

## 2013-12-29 DIAGNOSIS — M4120 Other idiopathic scoliosis, site unspecified: Secondary | ICD-10-CM | POA: Insufficient documentation

## 2013-12-29 LAB — BASIC METABOLIC PANEL
Anion gap: 14 (ref 5–15)
BUN: 17 mg/dL (ref 6–23)
CO2: 22 mEq/L (ref 19–32)
Calcium: 9.6 mg/dL (ref 8.4–10.5)
Chloride: 106 mEq/L (ref 96–112)
Creatinine, Ser: 0.7 mg/dL (ref 0.50–1.10)
GFR calc Af Amer: >90 mL/min (ref >90)
GFR calc non Af Amer: >90 mL/min (ref >90)
Glucose, Bld: 105 mg/dL — ABNORMAL HIGH (ref 70–99)
Potassium: 3.7 mEq/L (ref 3.7–5.3)
Sodium: 142 mEq/L (ref 137–147)

## 2013-12-29 LAB — CBC
HCT: 35 % — ABNORMAL LOW (ref 36.0–46.0)
Hemoglobin: 11.7 g/dL — ABNORMAL LOW (ref 12.0–15.0)
MCH: 30.1 pg (ref 26.0–34.0)
MCHC: 33.4 g/dL (ref 30.0–36.0)
MCV: 90 fL (ref 78.0–100.0)
Platelets: 259 10*3/uL (ref 150–400)
RBC: 3.89 MIL/uL (ref 3.87–5.11)
RDW: 12.6 % (ref 11.5–15.5)
WBC: 6.4 10*3/uL (ref 4.0–10.5)

## 2013-12-29 LAB — TYPE AND SCREEN
ABO/RH(D): A POS
Antibody Screen: NEGATIVE

## 2013-12-29 LAB — SURGICAL PCR SCREEN
MRSA, PCR: NEGATIVE
Staphylococcus aureus: NEGATIVE

## 2013-12-29 LAB — ABO/RH: ABO/RH(D): A POS

## 2013-12-29 NOTE — Pre-Procedure Instructions (Signed)
Liel Rudden  12/29/2013   Your procedure is scheduled on: Tuesday, September 29.  Report to [facility] Admitting at 5:30 AM.  Call this number if you have problems the morning of surgery: [phone]   Remember:   Do not eat food or drink liquids after midnight Monday, September 28.   Take these medicines the morning of surgery with A SIP OF WATER: None.               Stop taking ibuprofen (ADVIL,MOTRIN).    Do not wear jewelry, make-up or nail polish.  Do not wear lotions, powders, or perfumes.  Do not shave 48 hours prior to surgery.   Do not bring valuables to the hospital.              [facility] is not responsible for any belongings or valuables.               Contacts, dentures or bridgework may not be worn into surgery.  Leave suitcase in the car. After surgery it may be brought to your room.  For patients admitted to the hospital, discharge time is determined by your treatment team.                Special Instructions: Review  [facility] - Preparing For Surgery.   Please read over the following fact sheets that you were given: Pain Booklet, Coughing and Deep Breathing, Blood Transfusion Information and Surgical Site Infection Prevention

## 2014-01-04 MED ORDER — VANCOMYCIN HCL IN DEXTROSE 1-5 GM/200ML-% IV SOLN
1000.0000 mg | INTRAVENOUS | Status: AC
  Administered 2014-01-05: 1000 mg via INTRAVENOUS
  Filled 2014-01-04: qty 200

## 2014-01-04 MED ORDER — DEXAMETHASONE SODIUM PHOSPHATE 4 MG/ML IJ SOLN
10.0000 mg | INTRAMUSCULAR | Status: DC
Start: 2014-01-05 — End: 2014-01-05

## 2014-01-05 ENCOUNTER — Inpatient Hospital Stay (HOSPITAL_COMMUNITY): Payer: Medicare Other | Admitting: Certified Registered"

## 2014-01-05 ENCOUNTER — Inpatient Hospital Stay (HOSPITAL_COMMUNITY): Payer: Medicare Other

## 2014-01-05 ENCOUNTER — Encounter (HOSPITAL_COMMUNITY): Payer: Self-pay | Admitting: *Deleted

## 2014-01-05 ENCOUNTER — Encounter (HOSPITAL_COMMUNITY)
Admission: RE | Disposition: A | Discharge status: Skilled Nursing Facility | Payer: Self-pay | Source: Ambulatory Visit | Attending: Neurosurgery

## 2014-01-05 ENCOUNTER — Inpatient Hospital Stay (HOSPITAL_COMMUNITY)
Admission: RE | Admit: 2014-01-05 | Discharge: 2014-01-11 | DRG: 460 | Disposition: A | Discharge status: Skilled Nursing Facility | Payer: Medicare Other | Source: Ambulatory Visit | Attending: Neurosurgery | Admitting: Neurosurgery

## 2014-01-05 ENCOUNTER — Encounter (HOSPITAL_COMMUNITY): Payer: Medicare Other | Admitting: Certified Registered"

## 2014-01-05 DIAGNOSIS — M419 Scoliosis, unspecified: Secondary | ICD-10-CM

## 2014-01-05 DIAGNOSIS — M4180 Other forms of scoliosis, site unspecified: Secondary | ICD-10-CM | POA: Diagnosis present

## 2014-01-05 DIAGNOSIS — M4120 Other idiopathic scoliosis, site unspecified: Secondary | ICD-10-CM | POA: Diagnosis not present

## 2014-01-05 DIAGNOSIS — Z79899 Other long term (current) drug therapy: Secondary | ICD-10-CM

## 2014-01-05 DIAGNOSIS — Z87891 Personal history of nicotine dependence: Secondary | ICD-10-CM | POA: Diagnosis not present

## 2014-01-05 DIAGNOSIS — M4186 Other forms of scoliosis, lumbar region: Secondary | ICD-10-CM | POA: Diagnosis present

## 2014-01-05 DIAGNOSIS — M4806 Spinal stenosis, lumbar region: Secondary | ICD-10-CM | POA: Diagnosis present

## 2014-01-05 DIAGNOSIS — M48061 Spinal stenosis, lumbar region without neurogenic claudication: Secondary | ICD-10-CM | POA: Diagnosis not present

## 2014-01-05 HISTORY — PX: ANTERIOR LAT LUMBAR FUSION: SHX1168

## 2014-01-05 SURGERY — ANTERIOR LATERAL LUMBAR FUSION 2 LEVELS
Anesthesia: General | Laterality: Left

## 2014-01-05 MED ORDER — MIDAZOLAM HCL 2 MG/2ML IJ SOLN: 0.5000 mg | Freq: Once | INTRAMUSCULAR | Status: DC | PRN

## 2014-01-05 MED ORDER — ALUM & MAG HYDROXIDE-SIMETH 200-200-20 MG/5ML PO SUSP: 30.0000 mL | Freq: Four times a day (QID) | ORAL | Status: DC | PRN

## 2014-01-05 MED ORDER — CYCLOBENZAPRINE HCL 10 MG PO TABS
10.0000 mg | ORAL_TABLET | Freq: Three times a day (TID) | ORAL | Status: DC | PRN
  Administered 2014-01-05 – 2014-01-11 (×16): 10 mg via ORAL
  Filled 2014-01-05 (×16): qty 1

## 2014-01-05 MED ORDER — HYDROMORPHONE HCL 1 MG/ML IJ SOLN
INTRAMUSCULAR | Status: AC
  Filled 2014-01-05: qty 1

## 2014-01-05 MED ORDER — SUFENTANIL CITRATE 50 MCG/ML IV SOLN
INTRAVENOUS | Status: DC | PRN
  Administered 2014-01-05 (×2): 10 ug via INTRAVENOUS
  Administered 2014-01-05: 5 ug via INTRAVENOUS
  Administered 2014-01-05: 15 ug via INTRAVENOUS
  Administered 2014-01-05: 10 ug via INTRAVENOUS
  Administered 2014-01-05: 5 ug via INTRAVENOUS
  Administered 2014-01-05: 15 ug via INTRAVENOUS
  Administered 2014-01-05: 5 ug via INTRAVENOUS

## 2014-01-05 MED ORDER — PROPOFOL 10 MG/ML IV BOLUS
INTRAVENOUS | Status: DC | PRN
  Administered 2014-01-05: 120 mg via INTRAVENOUS

## 2014-01-05 MED ORDER — MIDAZOLAM HCL 5 MG/5ML IJ SOLN
INTRAMUSCULAR | Status: DC | PRN
  Administered 2014-01-05 (×2): 1 mg via INTRAVENOUS

## 2014-01-05 MED ORDER — PANTOPRAZOLE SODIUM 40 MG IV SOLR
40.0000 mg | Freq: Every day | INTRAVENOUS | Status: DC
  Administered 2014-01-05: 40 mg via INTRAVENOUS
  Filled 2014-01-05: qty 40

## 2014-01-05 MED ORDER — DIPHENHYDRAMINE HCL 50 MG/ML IJ SOLN
10.0000 mg | Freq: Once | INTRAMUSCULAR | Status: AC
  Administered 2014-01-05: 10 mg via INTRAVENOUS

## 2014-01-05 MED ORDER — HEMOSTATIC AGENTS (NO CHARGE) OPTIME
TOPICAL | Status: DC | PRN
  Administered 2014-01-05: 1 via TOPICAL

## 2014-01-05 MED ORDER — ACETAMINOPHEN 325 MG PO TABS: 650.0000 mg | ORAL_TABLET | ORAL | Status: DC | PRN

## 2014-01-05 MED ORDER — HYDROMORPHONE HCL 1 MG/ML IJ SOLN
0.2500 mg | INTRAMUSCULAR | Status: DC | PRN
  Administered 2014-01-05 (×2): 0.5 mg via INTRAVENOUS

## 2014-01-05 MED ORDER — DIPHENHYDRAMINE HCL 50 MG/ML IJ SOLN
INTRAMUSCULAR | Status: AC
  Filled 2014-01-05: qty 1

## 2014-01-05 MED ORDER — ZOLPIDEM TARTRATE 5 MG PO TABS: 5.0000 mg | ORAL_TABLET | Freq: Every evening | ORAL | Status: DC | PRN

## 2014-01-05 MED ORDER — ACETAMINOPHEN 650 MG RE SUPP: 650.0000 mg | RECTAL | Status: DC | PRN

## 2014-01-05 MED ORDER — PHENYLEPHRINE 40 MCG/ML (10ML) SYRINGE FOR IV PUSH (FOR BLOOD PRESSURE SUPPORT)
PREFILLED_SYRINGE | INTRAVENOUS | Status: AC
  Filled 2014-01-05: qty 10

## 2014-01-05 MED ORDER — MEPERIDINE HCL 25 MG/ML IJ SOLN: 6.2500 mg | INTRAMUSCULAR | Status: DC | PRN

## 2014-01-05 MED ORDER — MENTHOL 3 MG MT LOZG: 1.0000 | LOZENGE | OROMUCOSAL | Status: DC | PRN

## 2014-01-05 MED ORDER — LIDOCAINE HCL (CARDIAC) 20 MG/ML IV SOLN
INTRAVENOUS | Status: AC
  Filled 2014-01-05: qty 5

## 2014-01-05 MED ORDER — OXYCODONE HCL 5 MG/5ML PO SOLN: 5.0000 mg | Freq: Once | ORAL | Status: DC | PRN

## 2014-01-05 MED ORDER — SODIUM CHLORIDE 0.9 % IJ SOLN
3.0000 mL | Freq: Two times a day (BID) | INTRAMUSCULAR | Status: DC
  Administered 2014-01-08 – 2014-01-11 (×5): 3 mL via INTRAVENOUS

## 2014-01-05 MED ORDER — DEXAMETHASONE SODIUM PHOSPHATE 4 MG/ML IJ SOLN
INTRAMUSCULAR | Status: AC
  Filled 2014-01-05: qty 3

## 2014-01-05 MED ORDER — KCL IN DEXTROSE-NACL 20-5-0.45 MEQ/L-%-% IV SOLN
80.0000 mL/h | INTRAVENOUS | Status: DC
  Administered 2014-01-06 – 2014-01-07 (×3): 80 mL/h via INTRAVENOUS
  Filled 2014-01-05 (×4): qty 1000

## 2014-01-05 MED ORDER — SODIUM CHLORIDE 0.9 % IV SOLN: 250.0000 mL | INTRAVENOUS | Status: DC

## 2014-01-05 MED ORDER — ROCURONIUM BROMIDE 100 MG/10ML IV SOLN
INTRAVENOUS | Status: DC | PRN
  Administered 2014-01-05: 30 mg via INTRAVENOUS
  Administered 2014-01-05: 10 mg via INTRAVENOUS

## 2014-01-05 MED ORDER — BACITRACIN 50000 UNITS IM SOLR
INTRAMUSCULAR | Status: DC | PRN
  Administered 2014-01-05 (×2)

## 2014-01-05 MED ORDER — PROMETHAZINE HCL 25 MG/ML IJ SOLN: 6.2500 mg | INTRAMUSCULAR | Status: DC | PRN

## 2014-01-05 MED ORDER — OXYCODONE-ACETAMINOPHEN 5-325 MG PO TABS
1.0000 | ORAL_TABLET | ORAL | Status: DC | PRN
  Administered 2014-01-05 – 2014-01-11 (×27): 2 via ORAL
  Filled 2014-01-05 (×27): qty 2

## 2014-01-05 MED ORDER — EPHEDRINE SULFATE 50 MG/ML IJ SOLN
INTRAMUSCULAR | Status: DC | PRN
  Administered 2014-01-05: 5 mg via INTRAVENOUS
  Administered 2014-01-05: 10 mg via INTRAVENOUS
  Administered 2014-01-05 (×2): 5 mg via INTRAVENOUS
  Administered 2014-01-05: 10 mg via INTRAVENOUS

## 2014-01-05 MED ORDER — ONDANSETRON HCL 4 MG/2ML IJ SOLN
INTRAMUSCULAR | Status: DC | PRN
  Administered 2014-01-05: 4 mg via INTRAVENOUS

## 2014-01-05 MED ORDER — HYDROMORPHONE HCL 1 MG/ML IJ SOLN
1.0000 mg | INTRAMUSCULAR | Status: DC | PRN
  Administered 2014-01-06 (×2): 1 mg via INTRAMUSCULAR
  Administered 2014-01-06: 1.5 mg via INTRAMUSCULAR
  Administered 2014-01-07 – 2014-01-08 (×4): 1 mg via INTRAMUSCULAR
  Filled 2014-01-05 (×5): qty 1
  Filled 2014-01-05: qty 2
  Filled 2014-01-05: qty 1

## 2014-01-05 MED ORDER — SCOPOLAMINE 1 MG/3DAYS TD PT72
MEDICATED_PATCH | TRANSDERMAL | Status: DC | PRN
  Administered 2014-01-05: 1 via TRANSDERMAL

## 2014-01-05 MED ORDER — SUFENTANIL CITRATE 50 MCG/ML IV SOLN
INTRAVENOUS | Status: AC
  Filled 2014-01-05: qty 1

## 2014-01-05 MED ORDER — SODIUM CHLORIDE 0.9 % IJ SOLN: 3.0000 mL | INTRAMUSCULAR | Status: DC | PRN

## 2014-01-05 MED ORDER — LIDOCAINE HCL (CARDIAC) 20 MG/ML IV SOLN
INTRAVENOUS | Status: DC | PRN
  Administered 2014-01-05: 30 mg via INTRAVENOUS

## 2014-01-05 MED ORDER — VANCOMYCIN HCL 10 G IV SOLR
1250.0000 mg | Freq: Once | INTRAVENOUS | Status: AC
  Administered 2014-01-05: 1250 mg via INTRAVENOUS
  Filled 2014-01-05: qty 1250

## 2014-01-05 MED ORDER — SUCCINYLCHOLINE CHLORIDE 20 MG/ML IJ SOLN
INTRAMUSCULAR | Status: AC
  Filled 2014-01-05: qty 1

## 2014-01-05 MED ORDER — THROMBIN 5000 UNITS EX SOLR
CUTANEOUS | Status: DC | PRN
  Administered 2014-01-05 (×2): 5000 [IU] via TOPICAL

## 2014-01-05 MED ORDER — 0.9 % SODIUM CHLORIDE (POUR BTL) OPTIME
TOPICAL | Status: DC | PRN
  Administered 2014-01-05: 1000 mL

## 2014-01-05 MED ORDER — OXYCODONE HCL 5 MG PO TABS: 5.0000 mg | ORAL_TABLET | Freq: Once | ORAL | Status: DC | PRN

## 2014-01-05 MED ORDER — SCOPOLAMINE 1 MG/3DAYS TD PT72
MEDICATED_PATCH | TRANSDERMAL | Status: AC
  Filled 2014-01-05: qty 1

## 2014-01-05 MED ORDER — DEXAMETHASONE SODIUM PHOSPHATE 4 MG/ML IJ SOLN
INTRAMUSCULAR | Status: DC | PRN
  Administered 2014-01-05: 10 mg via INTRAVENOUS

## 2014-01-05 MED ORDER — PHENOL 1.4 % MT LIQD: 1.0000 | OROMUCOSAL | Status: DC | PRN

## 2014-01-05 MED ORDER — SUCCINYLCHOLINE CHLORIDE 20 MG/ML IJ SOLN
INTRAMUSCULAR | Status: DC | PRN
  Administered 2014-01-05: 100 mg via INTRAVENOUS

## 2014-01-05 MED ORDER — PROPOFOL 10 MG/ML IV BOLUS
INTRAVENOUS | Status: AC
  Filled 2014-01-05: qty 20

## 2014-01-05 MED ORDER — SODIUM CHLORIDE 0.9 % IJ SOLN
INTRAMUSCULAR | Status: AC
  Filled 2014-01-05: qty 10

## 2014-01-05 MED ORDER — PHENYLEPHRINE HCL 10 MG/ML IJ SOLN
INTRAMUSCULAR | Status: DC | PRN
  Administered 2014-01-05 (×4): 80 ug via INTRAVENOUS
  Administered 2014-01-05: 40 ug via INTRAVENOUS
  Administered 2014-01-05 (×4): 80 ug via INTRAVENOUS
  Administered 2014-01-05: 40 ug via INTRAVENOUS
  Administered 2014-01-05: 80 ug via INTRAVENOUS

## 2014-01-05 MED ORDER — DOCUSATE SODIUM 100 MG PO CAPS
100.0000 mg | ORAL_CAPSULE | Freq: Two times a day (BID) | ORAL | Status: DC
  Administered 2014-01-06 – 2014-01-11 (×11): 100 mg via ORAL
  Filled 2014-01-05 (×12): qty 1

## 2014-01-05 MED ORDER — ONDANSETRON HCL 4 MG/2ML IJ SOLN
4.0000 mg | INTRAMUSCULAR | Status: DC | PRN
  Administered 2014-01-05 – 2014-01-10 (×4): 4 mg via INTRAVENOUS
  Filled 2014-01-05 (×4): qty 2

## 2014-01-05 MED ORDER — MIDAZOLAM HCL 2 MG/2ML IJ SOLN
INTRAMUSCULAR | Status: AC
  Filled 2014-01-05: qty 2

## 2014-01-05 MED ORDER — BUPIVACAINE HCL (PF) 0.25 % IJ SOLN
INTRAMUSCULAR | Status: DC | PRN
  Administered 2014-01-05: 10 mL
  Administered 2014-01-05: 20 mL
  Administered 2014-01-05: 10 mL

## 2014-01-05 MED ORDER — LACTATED RINGERS IV SOLN
INTRAVENOUS | Status: DC | PRN
  Administered 2014-01-05 (×3): via INTRAVENOUS

## 2014-01-05 SURGICAL SUPPLY — 77 items
ADH SKN CLS APL DERMABOND .7 (GAUZE/BANDAGES/DRESSINGS) ×2
ADH SKN CLS LQ APL DERMABOND (GAUZE/BANDAGES/DRESSINGS) ×6
APL SKNCLS STERI-STRIP NONHPOA (GAUZE/BANDAGES/DRESSINGS) ×1
BAG DECANTER FOR FLEXI CONT (MISCELLANEOUS) ×3 IMPLANT
BENZOIN TINCTURE PRP APPL 2/3 (GAUZE/BANDAGES/DRESSINGS) ×2 IMPLANT
BLADE SURG ROTATE 9660 (MISCELLANEOUS) IMPLANT
BNDG ADH 5X4 AIR PERM ELC (GAUZE/BANDAGES/DRESSINGS) ×1
BNDG COHESIVE 4X5 WHT NS (GAUZE/BANDAGES/DRESSINGS) ×1 IMPLANT
BONE EQUIVA 10CC (Bone Implant) ×1 IMPLANT
BRUSH SCRUB EZ PLAIN DRY (MISCELLANEOUS) ×2 IMPLANT
CONT SPEC 4OZ CLIKSEAL STRL BL (MISCELLANEOUS) IMPLANT
COVER BACK TABLE 24X17X13 BIG (DRAPES) IMPLANT
DERMABOND ADHESIVE PROPEN (GAUZE/BANDAGES/DRESSINGS) ×6
DERMABOND ADVANCED (GAUZE/BANDAGES/DRESSINGS) ×2
DERMABOND ADVANCED .7 DNX12 (GAUZE/BANDAGES/DRESSINGS) ×2 IMPLANT
DERMABOND ADVANCED .7 DNX6 (GAUZE/BANDAGES/DRESSINGS) IMPLANT
DRAPE C-ARM 42X72 X-RAY (DRAPES) ×2 IMPLANT
DRAPE C-ARMOR (DRAPES) ×3 IMPLANT
DRAPE LAPAROTOMY 100X72X124 (DRAPES) ×3 IMPLANT
DRAPE POUCH INSTRU U-SHP 10X18 (DRAPES) ×1 IMPLANT
DRAPE SURG 17X23 STRL (DRAPES) ×4 IMPLANT
DRSG OPSITE POSTOP 4X6 (GAUZE/BANDAGES/DRESSINGS) ×1 IMPLANT
DRSG OPSITE POSTOP 4X8 (GAUZE/BANDAGES/DRESSINGS) ×1 IMPLANT
DRSG TELFA 3X8 NADH (GAUZE/BANDAGES/DRESSINGS) IMPLANT
DURAPREP 26ML APPLICATOR (WOUND CARE) ×3 IMPLANT
ELECT REM PT RETURN 9FT ADLT (ELECTROSURGICAL) ×4
ELECTRODE REM PT RTRN 9FT ADLT (ELECTROSURGICAL) ×1 IMPLANT
FORCEPS BPLR BAYO 10IN 1.0TIP (INSTRUMENTS) ×1 IMPLANT
GAUZE SPONGE 4X4 12PLY STRL (GAUZE/BANDAGES/DRESSINGS) ×2 IMPLANT
GAUZE SPONGE 4X4 16PLY XRAY LF (GAUZE/BANDAGES/DRESSINGS) IMPLANT
GLOVE ECLIPSE 7.0 STRL STRAW (GLOVE) ×2 IMPLANT
GLOVE ECLIPSE 8.0 STRL XLNG CF (GLOVE) ×2 IMPLANT
GLOVE EXAM NITRILE LRG STRL (GLOVE) IMPLANT
GLOVE EXAM NITRILE XL STR (GLOVE) IMPLANT
GLOVE EXAM NITRILE XS STR PU (GLOVE) IMPLANT
GLOVE INDICATOR 7.5 STRL GRN (GLOVE) ×2 IMPLANT
GLOVE SURG SS PI 7.0 STRL IVOR (GLOVE) ×3 IMPLANT
GOWN STRL REUS W/ TWL LRG LVL3 (GOWN DISPOSABLE) IMPLANT
GOWN STRL REUS W/ TWL XL LVL3 (GOWN DISPOSABLE) IMPLANT
GOWN STRL REUS W/TWL 2XL LVL3 (GOWN DISPOSABLE) ×2 IMPLANT
GOWN STRL REUS W/TWL LRG LVL3 (GOWN DISPOSABLE) ×4
GOWN STRL REUS W/TWL XL LVL3 (GOWN DISPOSABLE)
K-WIRE  1.6X 450L (WIRE) ×1
K-WIRE 1.6X 450L (WIRE) ×1
K-WIRE NITHNOL TROCAR TIP (WIRE) ×6 IMPLANT
KIT BASIN OR (CUSTOM PROCEDURE TRAY) ×3 IMPLANT
KIT DISP MARS 3V (KITS) ×1 IMPLANT
KIT PEDICLE ACCESS (KITS) ×1 IMPLANT
KIT ROOM TURNOVER OR (KITS) ×2 IMPLANT
KWIRE 1.6X 450L (WIRE) IMPLANT
NEEDLE HYPO 22GX1.5 SAFETY (NEEDLE) ×2 IMPLANT
NS IRRIG 1000ML POUR BTL (IV SOLUTION) ×2 IMPLANT
PACK LAMINECTOMY NEURO (CUSTOM PROCEDURE TRAY) ×3 IMPLANT
PAD DRESSING TELFA 3X8 NADH (GAUZE/BANDAGES/DRESSINGS) ×1 IMPLANT
PUTTY BONE GRAFT KIT 2.5ML (Bone Implant) ×1 IMPLANT
ROD PERC PREBENT 80MM (Rod) ×1 IMPLANT
ROD PREBENT PERC 75MM (Rod) ×2 IMPLANT
SCREW PATHFINDER 5.5X40MM (Screw) ×2 IMPLANT
SCREW POLYAXIA MIS 6.5X40MM (Screw) ×4 IMPLANT
SHEATH PAT (SHEATH) ×2 IMPLANT
SHIM STAINLESS (MISCELLANEOUS) ×1 IMPLANT
SPACER RISE 18X40MM 7-14MM (Spacer) ×1 IMPLANT
SPACER RISE 18X45MM 7-14MM (Spacer) ×1 IMPLANT
SPONGE LAP 4X18 X RAY DECT (DISPOSABLE) IMPLANT
SPONGE SURGIFOAM ABS GEL SZ50 (HEMOSTASIS) IMPLANT
STRIP CLOSURE SKIN 1/2X4 (GAUZE/BANDAGES/DRESSINGS) ×4 IMPLANT
SUT VIC AB 2-0 OS6 18 (SUTURE) ×13 IMPLANT
SUT VIC AB 3-0 CP2 18 (SUTURE) ×5 IMPLANT
SYR 20ML ECCENTRIC (SYRINGE) ×2 IMPLANT
SYR CONTROL 10ML LL (SYRINGE) ×1 IMPLANT
TAPE CLOTH 3X10 TAN LF (GAUZE/BANDAGES/DRESSINGS) ×4 IMPLANT
TOP CLSR SEQUOIA (Orthopedic Implant) ×7 IMPLANT
TOWEL OR 17X24 6PK STRL BLUE (TOWEL DISPOSABLE) ×2 IMPLANT
TOWEL OR 17X26 10 PK STRL BLUE (TOWEL DISPOSABLE) ×3 IMPLANT
TRAP SPECIMEN MUCOUS 40CC (MISCELLANEOUS) ×1 IMPLANT
TRAY FOLEY CATH 14FRSI W/METER (CATHETERS) ×2 IMPLANT
WATER STERILE IRR 1000ML POUR (IV SOLUTION) ×3 IMPLANT

## 2014-01-05 NOTE — Anesthesia Postprocedure Evaluation (Signed)
  Anesthesia Post-op Note  Patient: Natasha BeckmannJudith Frederick  Procedure(s) Performed: Procedure(s): LUMBAR TWO TO THREE, LUMBAR LUMBAR THREE TO FOUR, ANTERIOR LATERAL LUMBAR FUSION 2 LEVELS (Left)  Patient Location: PACU  Anesthesia Type:General  Level of Consciousness: awake, alert , oriented, patient cooperative and responds to stimulation  Airway and Oxygen Therapy: Patient Spontanous Breathing and Patient connected to nasal cannula oxygen  Post-op Pain: mild  Post-op Assessment: Post-op Vital signs reviewed, Patient's Cardiovascular Status Stable, Respiratory Function Stable, Patent Airway, No signs of Nausea or vomiting and Pain level controlled  Post-op Vital Signs: Reviewed and stable  Last Vitals:  Filed Vitals:   01/05/14 1457  BP: 149/82  Pulse: 65  Temp: 37 C  Resp: 18    Complications: No apparent anesthesia complications

## 2014-01-05 NOTE — Progress Notes (Signed)
Pt with decreased sats and low  RR after pain meds given , encouraged deep breaths

## 2014-01-05 NOTE — H&P (Signed)
Natasha BeckmannJudith Bonano is an 58 y.o. female.   Chief Complaint: Back and leg pain with scoliosis HPI: The patient is a 58 year old female who was evaluated in the office for back pain with radiation to the left leg a few years duration. She fell in January of this year and the pain increased. She is treated for number of years has tried chiropractic treatment physical therapy without improvement. She's tried multiple medications without relief. Orthopedist recommended a fusion but she preferred to have her surgery handle her care. She was evaluated in the office and on her scan has significant scoliosis at L2-3 and L3-4. We got a CAT scan to assess the anatomy and felt a lateral approach would be the best way to reestablish or coronal balance. It was therefore elected to do a two-level lateral fusion with percutaneous pedicle screw fixation. I had a long discussion with her regarding the risks and benefits of surgical intervention. The risks discussed include but are not limited to bleeding and infection weakness numbness trouble with instrumentation nonunion coma and death. We have discussed alternative methods of therapy although risks and benefits of nonintervention. She's had the opportunity to ask numerous questions and appears to understand. With this information in hand she has requested we proceed with surgery.  Past Medical History  Diagnosis Date  . Endometriosis     Past Surgical History  Procedure Laterality Date  . Breast surgery  1987    bx  . Tonsillectomy  1958  . Oophorectomy  1977    left  . Appendectomy  1963  . Tubal ligation  1986  . Colonoscopy w/ polypectomy    . Planters wart Left 2005    Family History  Problem Relation Age of Onset  . Arthritis Neg Hx     family hx  . Cancer Neg Hx     prostate ca -family hx  . Heart disease Neg Hx     family hx   Social History:  reports that she quit smoking about 19 years ago. Her smoking use included Cigarettes. She has a 8  pack-year smoking history. She does not have any smokeless tobacco history on file. She reports that she drinks about 3.6 ounces of alcohol per week. She reports that she does not use illicit drugs.  Allergies:  Allergies  Allergen Reactions  . Bee Venom Anaphylaxis and Swelling  . Penicillins Hives  . Sulfa Antibiotics Hives    Medications Prior to Admission  Medication Sig Dispense Refill  . acetaminophen (TYLENOL) 500 MG tablet Take 1,000 mg by mouth every 8 (eight) hours as needed.      . Dermatological Products, Misc. (NEOSALUS) CREA Apply 1 application topically 3 (three) times daily.      Marland Kitchen. ibuprofen (ADVIL,MOTRIN) 800 MG tablet Take 800 mg by mouth 3 (three) times daily as needed for mild pain.         No results found for this or any previous visit (from the past 48 hour(s)). No results found.  A comprehensive review of systems was negative.  Blood pressure 132/86, pulse 56, temperature 99 F (37.2 C), temperature source Oral, resp. rate 16, SpO2 98.00%.  The patient is awake alert and oriented. She is no facial ice symmetry. She is decreased with quadricep muscle strength decreased right dorsiflexor strength and decreased extensor pollicis longus bilaterally Assessment/Plan Impression is that of scoliosis L2-3 and L3-4. The plan is for a two-level lateral fusion with percutaneous pedicle screw instrumentation.  Reinaldo MeekerKRITZER,RANDY O, MD 01/05/2014, 7:28  AM

## 2014-01-05 NOTE — Op Note (Signed)
Preop diagnosis: Scoliosis and stenosis L2-3 L3-4 Postop diagnosis: Same Procedure: L2-3 L3-4 anterolateral fusion via left retroperitoneal approach L2-3-4 segmental instrumentation with Pathfinder percutaneous pedicle screw system Surgeon: Kritzer Assistant: Conchita Paris  After being placed in the left side up lateral decubitus position the patient was aligned appropriately with fluoroscopy and secured in place in standard fashion. The patient was then prepped and draped in usual sterile fashion. Linear incision was made above the L3-4 interspace and carried down through the fat. We then made a second incision posterior to it to allow safe entry into the retroperitoneum on the left. We then turned our finger superiorly and allow access for the more flank incision. We did sequential dilation through the psoas muscle and placed a retractor. We did EMG testing which showed no abnormal readings. We attached the retractor to the disc space in standard fashion open and all directions. We then incised the disc with a 15 blade and thoroughly cleaned out. We released the annulus on the opposite side with the small Cobb elevator. We used a variety distractors and instruments to clean out the disc space. We felt that the 7-14 mm expandable cage was a good choice and we chose an appropriately length cage of a 10 mm size and filled it with morselized allograft. We then irrigated and impacted without difficulty and fluoroscopy showed to be in good position. We expanded up approximately 2 mm patella take it was in excellent position. We then backfilled the cage without difficulty. We then irrigated once more and removed the retractor no bleeding was noted. We then made a linear incision over the disc space at L2-3. We carried this down almost to the retroperitoneum we then went we entered the posterior incision turned our finger upward to allow safe entry into the retroperitoneum at L2-3. Once again did sequential dilation  through the soft tissue and did EMG readings make sure there was no abnormal readings and none were encountered. We secured the retractor tested once more and secured to the disc space with the shim. We cleaned off the soft tissue and incised the disc space a 15 blade. Once again thoroughly cleaned out and release the annulus on the opposite side. We then opened up the disc space with distractors and felt once again a 7014 mm expandable cage was appropriate of 18 mm size and appropriate with. We filled with a mixture of autologous bone and impacted without difficulty. Fluoroscopy showed to be in excellent position. We then removed the retractor without difficulty after backfilling the cage. We then removed the retractor noted no bleeding. We irrigated the wounds and closed with interrupted Vicryl in multiple layers and Dermabond on the skin. A dressing was applied the patient was then turned into the prone position. Using AP fluoroscopy entry point the percutaneous pedicle screws after the area was prepped and draped in standard fashion. We passed a Jamshidi needles with the ultrasound guidance through the pedicle without difficulty and placed guidewires in their place. We removed the Jamshidi needles and then connected the small incisions and incised the fascia. We tapped with a 6.0 mm tap at L3 and L4 and the 5 mm tap at L2. We then placed 5.5 mm screws at L2 and 6.5 x 40 mm screws at L3 and L4. A the past month on the [facility] and secured the screws with top loading nuts. We then did final tightening with torque and counter torque and fluoroscopy again looked excellent. We irrigated the wounds and closed in  multiple layers of Vicryl on Dermabond and Steri-Strips on the skin. Sterile dressings were then applied the patient was extubated and taken to recovery room in stable condition.

## 2014-01-05 NOTE — Anesthesia Procedure Notes (Addendum)
Procedure Name: Intubation Date/Time: 01/05/2014 7:55 AM Performed by: Charm BargesBUTLER, DAVID R Pre-anesthesia Checklist: Patient identified, Emergency Drugs available, Suction available, Patient being monitored and Timeout performed Patient Re-evaluated:Patient Re-evaluated prior to inductionOxygen Delivery Method: Circle system utilized Preoxygenation: Pre-oxygenation with 100% oxygen Intubation Type: IV induction Ventilation: Mask ventilation without difficulty Laryngoscope Size: Mac and 3 Grade View: Grade III Tube type: Oral Tube size: 7.5 mm Number of attempts: 2 Airway Equipment and Method: Stylet (First attempt into esophagus and immediately recognized, ETT out, Grade III view again, AOI) Placement Confirmation: ETT inserted through vocal cords under direct vision,  positive ETCO2 and breath sounds checked- equal and bilateral Secured at: 21 cm Tube secured with: Tape Dental Injury: Teeth and Oropharynx as per pre-operative assessment  Future Recommendations: Recommend- induction with short-acting agent, and alternative techniques readily available

## 2014-01-05 NOTE — Anesthesia Preprocedure Evaluation (Addendum)
Anesthesia Evaluation  Patient identified by MRN, date of birth, ID band Patient awake    Reviewed: Allergy & Precautions, H&P , NPO status , Patient's Chart, lab work & pertinent test results  History of Anesthesia Complications Negative for: history of anesthetic complications  Airway Mallampati: II TM Distance: >3 FB Neck ROM: Full    Dental  (+) Teeth Intact, Dental Advisory Given, Caps   Pulmonary former smoker (quit 1995),  breath sounds clear to auscultation        Cardiovascular - anginanegative cardio ROS  Rhythm:Regular Rate:Normal     Neuro/Psych Chronic back pain    GI/Hepatic negative GI ROS, Neg liver ROS,   Endo/Other  negative endocrine ROS  Renal/GU negative Renal ROS     Musculoskeletal   Abdominal (+) + obese,   Peds  Hematology negative hematology ROS (+)   Anesthesia Other Findings   Reproductive/Obstetrics                          Anesthesia Physical Anesthesia Plan  ASA: II  Anesthesia Plan: General   Post-op Pain Management:    Induction: Intravenous  Airway Management Planned: Oral ETT  Additional Equipment:   Intra-op Plan:   Post-operative Plan: Extubation in OR  Informed Consent: I have reviewed the patients History and Physical, chart, labs and discussed the procedure including the risks, benefits and alternatives for the proposed anesthesia with the patient or authorized representative who has indicated his/her understanding and acceptance.   Dental advisory given  Plan Discussed with: CRNA, Anesthesiologist and Surgeon  Anesthesia Plan Comments: (Plan routine monitors, GETA)       Anesthesia Quick Evaluation

## 2014-01-05 NOTE — Transfer of Care (Signed)
Immediate Anesthesia Transfer of Care Note  Patient: Natasha Frederick  Procedure(s) Performed: Procedure(s): LUMBAR TWO TO THREE, LUMBAR LUMBAR THREE TO FOUR, ANTERIOR LATERAL LUMBAR FUSION 2 LEVELS (Left)  Patient Location: PACU  Anesthesia Type:General  Level of Consciousness: sedated  Airway & Oxygen Therapy: Patient Spontanous Breathing and Patient connected to nasal cannula oxygen  Post-op Assessment: Report given to PACU RN, Post -op Vital signs reviewed and stable and Patient moving all extremities  Post vital signs: Reviewed and stable  Complications: No apparent anesthesia complications

## 2014-01-05 NOTE — Progress Notes (Signed)
Utilization review completed.  

## 2014-01-06 MED ORDER — PANTOPRAZOLE SODIUM 40 MG PO TBEC
40.0000 mg | DELAYED_RELEASE_TABLET | Freq: Every day | ORAL | Status: DC
  Administered 2014-01-06 – 2014-01-10 (×5): 40 mg via ORAL
  Filled 2014-01-06 (×5): qty 1

## 2014-01-06 MED ORDER — DIAZEPAM 5 MG PO TABS
5.0000 mg | ORAL_TABLET | Freq: Four times a day (QID) | ORAL | Status: DC | PRN
  Administered 2014-01-06 – 2014-01-11 (×5): 5 mg via ORAL
  Filled 2014-01-06 (×5): qty 1

## 2014-01-06 NOTE — Progress Notes (Signed)
Attempted to get patient oob.  Patient refused saying pain in left leg was too severe.  Patient screamed out in pain and was unable to participate.  Lance BoschAnna Smythe, RN

## 2014-01-06 NOTE — Plan of Care (Signed)
Problem: Phase I Progression Outcomes Goal: OOB as tolerated unless otherwise ordered Outcome: Progressing Patient is up in chair at bedside while up with therapy.  Got up to bedside commode and returned to bed prior to supper with two assist.

## 2014-01-06 NOTE — Progress Notes (Signed)
Patient ID: Natasha BeckmannJudith Frederick, female   DOB: 04/26/1955, 58 y.o.   MRN: [id number] Afeb, vss No new neuro issues Wounds clean and dry. Appropriate soreness. Will increase activity. Dr Jordan LikesPool to cover while I am away. Hopefully here no longer than Friday.

## 2014-01-06 NOTE — Evaluation (Signed)
Physical Therapy Evaluation Patient Details Name: Natasha Frederick MRN: [id number] DOB: 01/24/1956 Today's Date: 01/06/2014   History of Present Illness  pt is 58 y.o. female s/p L2-L4 anterior/lateral lumbar fusion on 01/05/14.  Clinical Impression  Patient is s/p above surgery resulting in the deficits listed below (see PT Problem List).  Patient will benefit from skilled PT to increase their independence and safety with mobility (while adhering to their precautions) to allow discharge to next appropriate venue. Pt requiring 2 person (A) for SPT bed to 3 in 1 to recliner today. Pt greatly limited by pain and anxiety. Recommend SNF at this time due to incr (A) required and lack of mobility.      Follow Up Recommendations SNF;Supervision/Assistance - 24 hour    Equipment Recommendations  3in1 (PT)    Recommendations for Other Services OT consult     Precautions / Restrictions Precautions Precautions: Back;Fall Precaution Comments: reviewed back precautions with pt Required Braces or Orthoses: Spinal Brace Spinal Brace: Lumbar corset;Applied in sitting position Restrictions Weight Bearing Restrictions: No      Mobility  Bed Mobility Overal bed mobility: Needs Assistance Bed Mobility: Rolling;Sidelying to Sit Rolling: +2 for physical assistance;Mod assist Sidelying to sit: +2 for physical assistance;Max assist;HOB elevated       General bed mobility comments: pt required incr (A) and time due to pain; use of draw pad to initate roll and 2 person (A) for one to elevate trunk and one to bring LEs off EOB; pt yelling out due to c/o pain in Lt LE   Transfers Overall transfer level: Needs assistance Equipment used: 2 person hand held assist Transfers: Sit to/from [employer]Stand;Stand Pivot Transfers Sit to Stand: +2 physical assistance;Mod assist;From elevated surface Stand pivot transfers: From elevated surface;+2 physical assistance;Max assist       General transfer comment: pt very  anxious; max cues for positioning and hand placement; pt very unsteady and has difficulty facilitating pivotal steps with Lt LE due to pain; SPT Bed  > 3 in 1  > recliner  Ambulation/Gait             General Gait Details: limited to pivotal steps only due to pain   Stairs            Wheelchair Mobility    Modified Rankin (Stroke Patients Only)       Balance Overall balance assessment: Needs assistance Sitting-balance support: Feet supported;Bilateral upper extremity supported Sitting balance-Leahy Scale: Poor Sitting balance - Comments: pt bracing with UEs due to pain; sat EOB ~5 min with max (A) to donn brace; denied any dizziness Postural control: Posterior lean Standing balance support: During functional activity;Bilateral upper extremity supported Standing balance-Leahy Scale: Zero Standing balance comment: 2 person (A) for balance; pt very anxious and unsteady                             Pertinent Vitals/Pain Pain Assessment: Faces Faces Pain Scale: Hurts worst Pain Location: Lt LE (hip to knee) with movement  Pain Descriptors / Indicators: Stabbing;Shooting Pain Intervention(s): Repositioned;Premedicated before session;RN gave pain meds during session    Home Living Family/patient expects to be discharged to:: Private residence Living Arrangements: Spouse/significant other Available Help at Discharge: Family;Available 24 hours/day Type of Home: House Home Access: Stairs to enter Entrance Stairs-Rails: None Entrance Stairs-Number of Steps: 3 Home Layout: One level Home Equipment: Walker - 2 wheels Additional Comments: reports husband is 60 y.o. and cannot physically (  A) pt     Prior Function Level of Independence: Independent with assistive device(s)         Comments: ambulated with RW for past 2-3 months due to pain; does bird baths      Hand Dominance        Extremity/Trunk Assessment   Upper Extremity Assessment: Defer to OT  evaluation           Lower Extremity Assessment: Generalized weakness;LLE deficits/detail      Cervical / Trunk Assessment: Normal  Communication   Communication: No difficulties  Cognition Arousal/Alertness: Awake/alert Behavior During Therapy: Anxious Overall Cognitive Status: Impaired/Different from baseline Area of Impairment: Following commands       Following Commands: Follows one step commands with increased time       General Comments: delayed responses to commands could be due incr pain     General Comments General comments (skin integrity, edema, etc.): discussed D/c disposition with pt     Exercises Low Level/ICU Exercises Ankle Circles/Pumps: AROM;Both;10 reps;Supine      Assessment/Plan    PT Assessment Patient needs continued PT services  PT Diagnosis Difficulty walking;Acute pain;Generalized weakness   PT Problem List Decreased strength;Decreased activity tolerance;Decreased balance;Decreased mobility;Decreased knowledge of use of DME;Decreased safety awareness;Pain;Decreased knowledge of precautions  PT Treatment Interventions DME instruction;Gait training;Stair training;Functional mobility training;Therapeutic activities;Therapeutic exercise;Balance training;Neuromuscular re-education;Patient/family education   PT Goals (Current goals can be found in the Care Plan section) Acute Rehab PT Goals Patient Stated Goal: to not have pain PT Goal Formulation: With patient Time For Goal Achievement: 01/13/14 Potential to Achieve Goals: Good    Frequency Min 5X/week   Barriers to discharge Decreased caregiver support      Co-evaluation               End of Session Equipment Utilized During Treatment: Gait belt;Back brace Activity Tolerance: Patient limited by pain;Patient limited by fatigue Patient left: in chair;with call bell/phone within reach;with nursing/sitter in room Nurse Communication: Mobility status;Precautions;Patient requests pain  meds         Time: 1610-9604 PT Time Calculation (min): 19 min   Charges:   PT Evaluation $Initial PT Evaluation Tier I: 1 Procedure PT Treatments $Therapeutic Activity: 8-22 mins   PT G CodesDonell Sievert, [place]  [phone] 01/06/2014, 4:29 PM

## 2014-01-07 ENCOUNTER — Encounter (HOSPITAL_COMMUNITY): Payer: Self-pay | Admitting: Neurosurgery

## 2014-01-07 NOTE — Progress Notes (Signed)
Postop day 2. Patient with a great deal of left anterior thigh/groin pain. This has been stable postop. No new complaints of numbness or weakness. Patient does not feel that she'll be able to be discharged home without significant assistance as her husband is unable to care for her. She is interested in intermediate care options.  Afebrile. Vitals are stable. Motor and sensory exam stable. Wounds clean and dry. Abdomen soft.  Status post 2 level anterior posterior fusion. Continue efforts at mobilization. May require skilled nursing facility prior to discharge home. Plan for social work consult today.

## 2014-01-07 NOTE — Progress Notes (Signed)
UR complete.  Courtney Robarge RN, MSN 

## 2014-01-07 NOTE — Progress Notes (Signed)
Physical Therapy Treatment Patient Details Name: Natasha Frederick MRN: [id number] DOB: 1955-05-25 Today's Date: 01/07/2014    History of Present Illness pt is 58 y.o. female s/p L2-L4 anterior/lateral lumbar fusion on 01/05/14.    PT Comments    Pt continues to be greatly limited by pain.max encouragement throughout session to progress mobility. Cont to recommend SNF due to increased (A) needed. Will cont to follow per POC.   Follow Up Recommendations  SNF;Supervision/Assistance - 24 hour     Equipment Recommendations  3in1 (PT)    Recommendations for Other Services OT consult     Precautions / Restrictions Precautions Precautions: Back;Fall Precaution Comments: pt unable to recall back precautions; reviewed throughout session  Required Braces or Orthoses: Spinal Brace Spinal Brace: Lumbar corset;Applied in sitting position Restrictions Weight Bearing Restrictions: No    Mobility  Bed Mobility Overal bed mobility: +2 for physical assistance;Needs Assistance Bed Mobility: Rolling;Sidelying to Sit Rolling: +2 for physical assistance;Mod assist Sidelying to sit: +2 for physical assistance;Max assist;HOB elevated       General bed mobility comments: pt very anxious and limited by pain; required 2 person (A) to log roll and elevate at trunk; max cues for sequencing   Transfers Overall transfer level: Needs assistance Equipment used: Rolling walker (2 wheeled) Transfers: Sit to/from Stand Sit to Stand: +2 physical assistance;Mod assist;From elevated surface         General transfer comment: pt in obvious pain and buckling in Lt LE at times; max cues for safety and 2 person (A) for safety and to balance; deep breathing cues due to anxiety   Ambulation/Gait Ambulation/Gait assistance: +2 physical assistance;Min assist Ambulation Distance (Feet): 12 Feet Assistive device: Rolling walker (2 wheeled) Gait Pattern/deviations: Step-through pattern;Decreased stance time -  left;Decreased step length - right;Antalgic;Narrow base of support Gait velocity: very decreased Gait velocity interpretation: <1.8 ft/sec, indicative of risk for recurrent falls General Gait Details: pt able to progress mobility with max encouragement; pt greatly limited by pain in Lt LE; cues for sequencing and safety; 2 person (A) for safety due to anxiety    Stairs            Wheelchair Mobility    Modified Rankin (Stroke Patients Only)       Balance Overall balance assessment: Needs assistance Sitting-balance support: Feet supported;Bilateral upper extremity supported Sitting balance-Leahy Scale: Poor Sitting balance - Comments: pt very guarded and requires UE support sitting on EOB; pt reaching for therapist throughout session; max cues to brace herself   Standing balance support: During functional activity;Bilateral upper extremity supported Standing balance-Leahy Scale: Poor Standing balance comment: requires bil UE support; pt limited by pain; pt requires total (A) for pericare and balance at [facility]                    Cognition Arousal/Alertness: Awake/alert Behavior During Therapy: Anxious Overall Cognitive Status: Impaired/Different from baseline Area of Impairment: Following commands;Problem solving     Memory: Decreased recall of precautions Following Commands: Follows one step commands with increased time     Problem Solving: Slow processing;Decreased initiation;Requires tactile cues;Requires verbal cues General Comments: delayed responses to commands could be due incr pain     Exercises      General Comments General comments (skin integrity, edema, etc.): max encouragement for OOB for all meals      Pertinent Vitals/Pain Faces Pain Scale: Hurts worst Pain Location: Lt hip Pain Descriptors / Indicators: Cramping;Sharp;Shooting Pain Intervention(s): Repositioned;Ice applied;Monitored during session;Limited activity within  patient's  tolerance;Premedicated before session    Home Living                      Prior Function            PT Goals (current goals can now be found in the care plan section) Acute Rehab PT Goals Patient Stated Goal: to not have pain PT Goal Formulation: With patient Time For Goal Achievement: 01/13/14 Potential to Achieve Goals: Good Progress towards PT goals: Progressing toward goals    Frequency  Min 5X/week    PT Plan Current plan remains appropriate    Co-evaluation             End of Session Equipment Utilized During Treatment: Gait belt;Back brace Activity Tolerance: Patient limited by pain;Patient limited by fatigue Patient left: in chair;with call bell/phone within reach     Time: 0925-0949 PT Time Calculation (min): 24 min  Charges:  $Gait Training: 8-22 mins $Therapeutic Activity: 8-22 mins                    G Codes:      Donnamarie PoagWest, Brittany [place], [place]  [phone] 01/07/2014, 2:41 PM

## 2014-01-08 MED ORDER — BISACODYL 10 MG RE SUPP
10.0000 mg | Freq: Every day | RECTAL | Status: DC | PRN
  Administered 2014-01-08: 10 mg via RECTAL
  Filled 2014-01-08: qty 1

## 2014-01-08 NOTE — Progress Notes (Signed)
Patient still having quite a bit of left groin and anterior thigh pain. Difficulty mobilizing secondary to pain.  Afebrile. Vitals are stable. Abdomen soft. Motor and sensory function stable. Still with some dysesthesia in her left anterior thigh.  Progressing slowly. Continue efforts at mobilization. Working towards the skilled nursing facility placement.

## 2014-01-08 NOTE — Care Management Note (Addendum)
  Page 1 of 1   01/11/2014     11:29:45 AM CARE MANAGEMENT NOTE 01/11/2014  Patient:  Natasha Frederick,Natasha Frederick   Account Number:  [id number]  Date Initiated:  01/07/2014  Documentation initiated by:  Elmer BalesOBARGE,COURTNEY  Subjective/Objective Assessment:   Patient was admitted for a lumbar fusion.  Lives at home with spouse.     Action/Plan:   Will follow for discharge needs pending PT/OT evals and physician orders.   Anticipated DC Date:     Anticipated DC Plan:  SKILLED NURSING FACILITY  In-house referral  Clinical Social Worker         Choice offered to / List presented to:             Status of service:  In process, will continue to follow Medicare Important Message given?  YES (If response is "NO", the following Medicare IM given date fields will be blank) Date Medicare IM given:  01/08/2014 Medicare IM given by:  Elmer BalesOBARGE,COURTNEY Date Additional Medicare IM given:  01/11/2014 Additional Medicare IM given by:  Elmer BalesOURTNEY ROBARGE  Discharge Disposition:  SKILLED NURSING FACILITY  Per UR Regulation:  Reviewed for med. necessity/level of care/duration of stay  If discussed at Long Length of Stay Meetings, dates discussed:    Comments:  01/11/14 1100 Elmer Balesourtney Robarge RN, MSN, CM- Additional Medicare IM letter provided.   01/08/14 1020 Courtney Robarge RN, MSN, CM- Medicare IM letter provided.

## 2014-01-08 NOTE — Clinical Social Work Placement (Signed)
Clinical Social Work Department CLINICAL SOCIAL WORK PLACEMENT NOTE 01/08/2014  Patient:  Natasha Frederick,Natasha Frederick  Account Number:  [id number] Admit date:  01/05/2014  Clinical Social Worker:  Mosie EpsteinEMILY S VAUGHN, LCSWA  Date/time:  01/08/2014 04:02 PM  Clinical Social Work is seeking post-discharge placement for this patient at the following level of care:   SKILLED NURSING   (*CSW will update this form in Epic as items are completed)   01/08/2014  Patient/family provided with [facility] of Clinical Social Work's list of facilities offering this level of care within the geographic area requested by the patient (or if unable, by the patient's family).  01/08/2014  Patient/family informed of their freedom to choose among providers that offer the needed level of care, that participate in Medicare, Medicaid or managed care program needed by the patient, have an available bed and are willing to accept the patient.  01/08/2014  Patient/family informed of [facility]' ownership interest in [facility], as well as of the fact that they are under no obligation to receive care at this facility.  PASARR submitted to EDS on 01/08/2014 PASARR number received on 01/08/2014  FL2 transmitted to all facilities in geographic area requested by pt/family on  01/08/2014 FL2 transmitted to all facilities within larger geographic area on   Patient informed that his/her managed care company has contracts with or will negotiate with  certain facilities, including the following:     Patient/family informed of bed offers received:  01/08/2014 Patient chooses bed at  Physician recommends and patient chooses bed at    Patient to be transferred to  on   Patient to be transferred to facility by  Patient and family notified of transfer on  Name of family member notified:    The following physician request were entered in Epic:   Additional Comments:  Lily Kochermily Vaughn, LCSWA [phone]) Licensed Clinical  Social Worker Neuroscience [phone]([id number]) and Medical ICU (66M)

## 2014-01-08 NOTE — Progress Notes (Signed)
Physical Therapy Treatment Patient Details Name: Natasha BeckmannJudith Frederick MRN: [id number] DOB: 12/02/1955 Today's Date: 01/08/2014    History of Present Illness pt is 58 y.o. female s/p L2-L4 anterior/lateral lumbar fusion on 01/05/14.    PT Comments    Pt in bed had just finishes a wash up reported NT.  Assisted pt from supine to EOB using Log Roll tech required increased, increased time and max VC's on proper tech as pt demon increased anxiety, increased pain level and screaming.  Pt instructed on deep breathing relaxation tech and given increased time to rest between each position change.  Slow and steady mvts to decrease pain level and emotional state.  Applied brace while EOB pt used B UE's excessively to support self in sitting.  Unable to weight shift off.   Using + 2 assist pt was able to stand with less screaming and anxiety.  Allowed increased time to adjust posture before progresisng gait. Pt amb increased distance this session with recliner following for safety.    Follow Up Recommendations  SNF     Equipment Recommendations       Recommendations for Other Services       Precautions / Restrictions Precautions Precautions: Back;Fall Precaution Comments: pt unable to recall back precautions; reviewed throughout session.  Easilt distracted by pain level. Required Braces or Orthoses: Spinal Brace Spinal Brace: Lumbar corset;Applied in sitting position Restrictions Weight Bearing Restrictions: No    Mobility  Bed Mobility Overal bed mobility: +2 for physical assistance;Needs Assistance Bed Mobility: Rolling;Sidelying to Sit Rolling: +2 for physical assistance;Max assist Sidelying to sit: +2 for physical assistance;Max assist;HOB elevated       General bed mobility comments: pt very anxious and limited by pain; required 2 person (A) to log roll and elevate at trunk; max cues for sequencing.  Pt screaming with MAX anxiety.  required increased time.  Transfers Overall transfer level:  Needs assistance Equipment used: Rolling walker (2 wheeled) Transfers: Sit to/from Stand Sit to Stand: +2 physical assistance;Mod assist;From elevated surface  Assisted on/off BSC twice during session as well Voided 400       General transfer comment: required MAX encouragement to decrease anxiety.   75% VC's on proper tech and hand placement.  At times pt thrashing due to pain letting go of walker and L LE bucking.  100% cueing relaxation tech and redirection to focus on task.  Ambulation/Gait Ambulation/Gait assistance: +2 physical assistance;Min assist;Mod assist Ambulation Distance (Feet): 22 Feet Assistive device: Rolling walker (2 wheeled) Gait Pattern/deviations: Step-to pattern;Step-through pattern;Decreased stance time - left;Ataxic;Antalgic;Narrow base of support Gait velocity: very decreased   General Gait Details: Max anxiety and required increased time.  75% VC's on proper walker to self distance, proper upright posture and to increase step length.  Pt c/o MAX L anterior proximal thigh pain (cramping/sharpe/deep) causing L knee buckle.  very unsteady gait.  HIGH FALL RISK.   Stairs            Wheelchair Mobility    Modified Rankin (Stroke Patients Only)       Balance                                    Cognition                            Exercises      General Comments  Pertinent Vitals/Pain      Home Living                      Prior Function            PT Goals (current goals can now be found in the care plan section) Progress towards PT goals: Progressing toward goals (slowly)    Frequency       PT Plan Current plan remains appropriate    Co-evaluation             End of Session Equipment Utilized During Treatment: Gait belt;Back brace Activity Tolerance: Patient limited by pain;Patient limited by fatigue (anxiety)       Time: 1405-1430 PT Time Calculation (min): 25  min  Charges:  $Gait Training: 8-22 mins $Therapeutic Activity: 8-22 mins                    G Codes:      Felecia Shelling  PTA [facility]  Acute  Rehab Pager      [phone]

## 2014-01-08 NOTE — Clinical Social Work Note (Signed)
Bed offers provided to patient. Pt informed CSW pt and pt's husband SNF choices are as follows: [facility]  CSW to continue to follow and assist with discharge planning needs.  Marcelline Deistmily Vaughn, LCSWA [phone]) Licensed Clinical Social Worker Neuroscience [phone]([id number]) and Medical ICU (46M)

## 2014-01-08 NOTE — Clinical Social Work Note (Signed)
Clinical Social Work Department BRIEF PSYCHOSOCIAL ASSESSMENT 01/08/2014  Patient:  TREASE, BREMNER     Account Number:  [id number]     Admit date:  01/05/2014  Clinical Social Worker:  Delrae Sawyers  Date/Time:  01/08/2014 03:55 PM  Referred by:  Physician  Date Referred:  01/08/2014 Referred for  SNF Placement   Other Referral:   none.   Interview type:  Patient Other interview type:   none.    PSYCHOSOCIAL DATA Living Status:  HUSBAND Admitted from facility:   Level of care:   Primary support name:  Steele Sizer Primary support relationship to patient:  SPOUSE Degree of support available:   Strong.    CURRENT CONCERNS Current Concerns  Post-Acute Placement   Other Concerns:   none.    SOCIAL WORK ASSESSMENT / PLAN CSW met with pt at bedside to discuss discharge disposition. Pt stated she was interested in pursuing SNF placement at time of discharge to complete short-term rehabilitation. Pt informed CSW pt is from home with husband, who is currently unable to care for pt.    CSW to continue to follow and assist with discharge planning needs.   Assessment/plan status:  Psychosocial Support/Ongoing Assessment of Needs Other assessment/ plan:   none.   Information/referral to community resources:   [facility] bed offers.    PATIENT'S/FAMILY'S RESPONSE TO PLAN OF CARE: Pt understanding and agreeable to CSW plan of care. Pt expressed no further questions or concerns.       Lubertha Sayres, [place] [phone]) Licensed Clinical Social Worker Neuroscience [phone]) and Medical ICU (69M)

## 2014-01-09 MED ORDER — GABAPENTIN 300 MG PO CAPS
300.0000 mg | ORAL_CAPSULE | Freq: Three times a day (TID) | ORAL | Status: DC
  Administered 2014-01-09 – 2014-01-11 (×8): 300 mg via ORAL
  Filled 2014-01-09 (×8): qty 1

## 2014-01-09 NOTE — Progress Notes (Signed)
Fabric tape/gauze peeling off back incision; skin pink; two small blisters at the edge of the tape; dressing gently removed; steri strips dry and intact; incision clean and dry; no drainage; strips left open to air; patient states she is sensitive to adhesives.

## 2014-01-09 NOTE — Progress Notes (Signed)
Physical Therapy Treatment Patient Details Name: Natasha Frederick MRN: [id number] DOB: [birth year] Today's Date: 01/09/2014    History of Present Illness pt is 58 y.o. female s/p L2-L4 anterior/lateral lumbar fusion on 01/05/14.    PT Comments    Pt making great progress today, still limited by pain and anxiety. Does better with max cues on relaxation/deep breathing and if allowed increased time with rest between transitional movements.  Follow Up Recommendations  SNF     Equipment Recommendations  3in1 (PT)    Recommendations for Other Services OT consult     Precautions / Restrictions Precautions Precautions: Back;Fall Precaution Comments: pt able to recall no bending, re-educated on other back precautions. Pt with very guarded posture in sitting, max cues for posture/relaxation/decr'd UE weight bearing Required Braces or Orthoses: Spinal Brace Spinal Brace: Lumbar corset;Applied in sitting position (total assist as pt has max weight on UE's to off load in sitting) Restrictions Weight Bearing Restrictions: No    Mobility  Bed Mobility Overal bed mobility: Needs Assistance;+ 2 for safety/equipment   Rolling: Min assist;+2 for safety/equipment Sidelying to sit: Min assist;+2 for safety/equipment;HOB elevated       General bed mobility comments: pt need's step by step instruction with time to rest in between transitional movements. rail used with mobility. pt very anxious with all movements and screaming out with sitting up from sidelying to sitting edge of bed. Once seated at edge of bed pt very anxious with max weight throught UE's off loading pelvis/hips from bed surface due to pain.                      Transfers Overall transfer level: Needs assistance Equipment used: Rolling walker (2 wheeled) Transfers: Sit to/from [employer] Sit to Stand: Min assist;+2 safety/equipment Stand pivot transfers: Min assist;+2 safety/equipment       General transfer  comment: cues on hand placement for safety with sit/stand transfers. pt with stiff/guarded posture and max reliance on UE's on walker with stand/pivot transfer bed to bsc. no buckling noted today or thrashing in pain as with previous session.  Ambulation/Gait Ambulation/Gait assistance: Min assist Ambulation Distance (Feet): 250 Feet Assistive device: Rolling walker (2 wheeled) Gait Pattern/deviations: Step-to pattern;Antalgic;Decreased step length - left;Decreased stance time - left;Decreased stride length;Narrow base of support;Step-through pattern Gait velocity: decreased Gait velocity interpretation: Below normal speed for age/gender General Gait Details: required cues on walker safety and position with gait, cues to relax shoulders/posture to decr guarded/stiff posture. no buckling noted today with a steady gait pattern today.   Stairs            Wheelchair Mobility    Modified Rankin (Stroke Patients Only)          Cognition Arousal/Alertness: Awake/alert Behavior During Therapy: Anxious Overall Cognitive Status: Within Functional Limits for tasks assessed       Memory: Decreased recall of precautions               Pertinent Vitals/Pain Pain Assessment: 0-10 Pain Score: 9  Pain Location: lt hip/ant-lateral thigh Pain Descriptors / Indicators: Spasm;Cramping;Constant;Sharp Pain Intervention(s): Monitored during session;Repositioned;Utilized relaxation techniques;Limited activity within patient's tolerance;RN gave pain meds during session     PT Goals (current goals can now be found in the care plan section) Acute Rehab PT Goals Patient Stated Goal: to not have pain PT Goal Formulation: With patient Time For Goal Achievement: 01/13/14 Potential to Achieve Goals: Good Progress towards PT goals: Progressing toward goals  Frequency  Min 5X/week    PT Plan Current plan remains appropriate       End of Session Equipment Utilized During Treatment:  Gait belt;Back brace Activity Tolerance: Patient limited by pain;Patient tolerated treatment well Patient left: in chair;with call bell/phone within reach     Time: [id number] PT Time Calculation (min): 39 min  Charges:  $Gait Training: 23-37 mins $Therapeutic Activity: 8-22 mins                    G Codes:      Sallyanne KusterBury, Kathy 01/09/2014, 11:41 AM   Sallyanne KusterKathy Bury, PTA Office- [phone]

## 2014-01-09 NOTE — Progress Notes (Signed)
Patient seems somewhat more comfortable today. Still complaining of left hip/groin/anterior thigh pain. Symptoms are aggravated by attempting to stand or move. Overall the patient states that her symptoms are similar but worse to her preop symptoms. She has no new changes of numbness or weakness.  Afebrile. Vitals are stable. Abdomen is soft. Motor and sensory exam stable. Wounds clean and dry.  Status post 2 level anterior posterior decompression and fusion. Patient progressing slowly with continued neuropathic pain. We'll add Neurontin today and continue efforts at mobilization. Discharge to skilled nursing facility pending bed availability.

## 2014-01-10 NOTE — Progress Notes (Signed)
Patient ID: Natasha BeckmannJudith Knutzen, female   DOB: 12/05/1955, 58 y.o.   MRN: [id number] Still having significant left anterior and lateral thigh pain. The nurses report that she will "scream out in pain" during transfers. She did walk some today. She describes weakness in her left hip flexor. There is some numbness. On exam she has good dorsi and plantar flexion. She has at least antigravity knee extension but does have weakness of the left hip flexors.  Continue pain management. Continue therapy.

## 2014-01-11 ENCOUNTER — Encounter (HOSPITAL_COMMUNITY): Payer: Self-pay | Admitting: Neurosurgery

## 2014-01-11 MED ORDER — OXYCODONE-ACETAMINOPHEN 5-325 MG PO TABS: 1.0000 | ORAL_TABLET | ORAL | Status: DC | PRN

## 2014-01-11 NOTE — Progress Notes (Signed)
Patient ID: Natasha BeckmannJudith Bega, female   DOB: 01/23/1956, 58 y.o.   MRN: [id number] Afeb, vss No new neuro issues. Strength remains the same. She is starting to increase activity. She feels that she would benefit from a short stay at a SNF and this seems reasonable. She is ready to go as soon as there is a bed available for her. Wounds all clean and dry.

## 2014-01-11 NOTE — Progress Notes (Signed)
Report called to RN taking care of pt at [facility]. All questions answered. Pt has arrived to the facility safely and in no acute distress.

## 2014-01-11 NOTE — Discharge Summary (Signed)
  Physician Discharge Summary  Patient ID: Natasha Frederick MRN: [id number] DOB/AGE: 60/07/1955 60 y.o.  Admit date: 01/05/2014 Discharge date: 01/11/2014  Admission Diagnoses:  Discharge Diagnoses:  Active Problems:   Scoliosis of lumbar spine   Discharged Condition: good  Hospital Course: Surgery 6 days ago for 2 level antero lateral fusion. Slow to rpogress, but eventually did well. Pain slowly decreased. Wounds healed well. By pod 6, ambulated well. Home with specific instructions given.  Consults: None  Significant Diagnostic Studies: none  Treatments: surgery: L 23 L 34 xlif  Discharge Exam: Blood pressure 117/69, pulse 87, temperature 98.3 F (36.8 C), temperature source Oral, resp. rate 18, height 5\' 5"  (1.651 m), weight 70.5 kg (155 lb 6.8 oz), SpO2 93.00%. Incision/Wound:clean and dry; no new neuro issues  Disposition: Final discharge disposition not confirmed     Medication List    ASK your doctor about these medications       acetaminophen 500 MG tablet  Commonly known as:  TYLENOL  Take 1,000 mg by mouth every 8 (eight) hours as needed.     ibuprofen 800 MG tablet  Commonly known as:  ADVIL,MOTRIN  Take 800 mg by mouth 3 (three) times daily as needed for mild pain.     NEOSALUS Crea  Apply 1 application topically 3 (three) times daily.         At home rest most of the time. Get up 9 or 10 times each day and take a 15 or 20 minute walk. No riding in the car and to your first postoperative appointment. If you have neck surgery you may shower from the chest down starting on the third postoperative day. If you had back surgery he may start showering on the third postoperative day with saran wrap wrapped around your incisional area 3 times. After the shower remove the saran wrap. Take pain medicine as needed and other medications as instructed. Call my office for an appointment.  SignedReinaldo Meeker: KRITZER,RANDY O, MD 01/11/2014, 3:47 PM

## 2014-01-11 NOTE — Progress Notes (Signed)
Discharge teaching completed. Dr. Wynetta Emeryram called regarding a signature for pt's prescription for Percocet. Prescription now with pt. Waiting for [facility] to transport pt to [facility].

## 2014-01-11 NOTE — Progress Notes (Signed)
Physical Therapy Treatment Patient Details Name: Natasha BeckmannJudith Sadowsky MRN: [id number] DOB: 06/19/1955 Today's Date: 01/11/2014    History of Present Illness pt is 58 y.o. female s/p L2-L4 anterior/lateral lumbar fusion on 01/05/14.    PT Comments    Patient showing some improvement with overall mobility. Still having some spasms in L hip causing extreme pain and safety issues at times. Continue to recommend SNF for ongoing Physical Therapy.     Follow Up Recommendations  SNF     Equipment Recommendations  3in1 (PT)    Recommendations for Other Services       Precautions / Restrictions Precautions Precautions: Back;Fall Precaution Comments: pt able to recall no bending, re-educated on other back precautions. Pt with very guarded posture in sitting, max cues for posture/relaxation/decr'd UE weight bearing Required Braces or Orthoses: Spinal Brace Spinal Brace: Lumbar corset;Applied in sitting position    Mobility  Bed Mobility Overal bed mobility: Needs Assistance;+ 2 for safety/equipment   Rolling: Min assist Sidelying to sit: Min assist       General bed mobility comments: Min A for correct technique with step by step cueing. Cues for postioining in sitting and to allow weight shifting the L side  Transfers Overall transfer level: Needs assistance Equipment used: Rolling walker (2 wheeled)   Sit to Stand: Min assist;+2 safety/equipment         General transfer comment: cues on hand placement for safety with sit/stand transfers. pt with stiff/guarded posture and max reliance on UE's on walker. Patient "jerked" into standing due to spasm in L hip. Cues to control descent onto recliner  Ambulation/Gait Ambulation/Gait assistance: Min assist Ambulation Distance (Feet): 180 Feet Assistive device: Rolling walker (2 wheeled) Gait Pattern/deviations: Step-to pattern;Antalgic;Narrow base of support Gait velocity: decreased Gait velocity interpretation: Below normal speed for  age/gender General Gait Details: required cues on walker safety and position with gait, cues to relax shoulders/posture to decr guarded/stiff posture.    Stairs            Wheelchair Mobility    Modified Rankin (Stroke Patients Only)       Balance                                    Cognition Arousal/Alertness: Awake/alert Behavior During Therapy: Anxious Overall Cognitive Status: Within Functional Limits for tasks assessed                      Exercises      General Comments        Pertinent Vitals/Pain Pain Score: 8  Pain Location: Lhip Pain Descriptors / Indicators: Spasm Pain Intervention(s): Monitored during session    Home Living                      Prior Function            PT Goals (current goals can now be found in the care plan section) Progress towards PT goals: Progressing toward goals    Frequency  Min 5X/week    PT Plan Current plan remains appropriate    Co-evaluation             End of Session Equipment Utilized During Treatment: Gait belt;Back brace Activity Tolerance: Patient limited by pain;Patient tolerated treatment well Patient left: in chair;with call bell/phone within reach     Time: [id number] PT Time Calculation (min): 24 min  Charges:  $Gait Training: 8-22 mins $Therapeutic Activity: 8-22 mins                    G Codes:      Fredrich Birks 01/11/2014, 1:13 PM 01/11/2014 Fredrich Birks PTA [phone] pager [phone] office

## 2014-01-11 NOTE — Progress Notes (Signed)
Discharge orders received. Pt for discharge home today. IV d/c'd. Dressing clean, dry, intact to lower back. Pt given discharge instructions and prescriptions with verbalized understanding. Family aware of discharge to skilled nursing facility. Pt transported via [facility] to [facility].

## 2014-01-11 NOTE — Clinical Social Work Note (Addendum)
3:25pm- CSW requesting assistance from Charge RN with contacting the MD for the discharge summary/order.  Charge is agreeable to provide assistance.    2:14pm- CSW met with pt to review bed offers.  Pt has chosen [facility], SNF for STR.  CSW contacted Janie at [facility] to confirm bed availability.  CSW contacted pt husband, Lewis to complete admission's paperwork with [facility] at 3pm- pt agreeable.  CSW has contacted MD to request discharge summary and order if MD continues to feel discharge is appropriate.  CSW awaits DC summary/order to complete discharge process.  Nonnie Done, [place] [phone]  Psychiatric & Orthopedics (5N 1-16) Clinical Social Worker

## 2014-01-13 ENCOUNTER — Emergency Department (HOSPITAL_COMMUNITY)
Admission: EM | Admit: 2014-01-13 | Discharge: 2014-01-14 | Disposition: A | Discharge status: Home / Self Care | Payer: Federal, State, Local not specified - PPO | Attending: Emergency Medicine | Admitting: Emergency Medicine

## 2014-01-13 ENCOUNTER — Encounter (HOSPITAL_COMMUNITY): Payer: Self-pay | Admitting: Emergency Medicine

## 2014-01-13 DIAGNOSIS — G8918 Other acute postprocedural pain: Secondary | ICD-10-CM | POA: Diagnosis not present

## 2014-01-13 DIAGNOSIS — M79605 Pain in left leg: Secondary | ICD-10-CM | POA: Insufficient documentation

## 2014-01-13 DIAGNOSIS — Z880 Allergy status to penicillin: Secondary | ICD-10-CM | POA: Diagnosis not present

## 2014-01-13 DIAGNOSIS — M545 Low back pain, unspecified: Secondary | ICD-10-CM

## 2014-01-13 DIAGNOSIS — Z8742 Personal history of other diseases of the female genital tract: Secondary | ICD-10-CM | POA: Insufficient documentation

## 2014-01-13 DIAGNOSIS — Z87891 Personal history of nicotine dependence: Secondary | ICD-10-CM | POA: Insufficient documentation

## 2014-01-13 DIAGNOSIS — Z981 Arthrodesis status: Secondary | ICD-10-CM | POA: Insufficient documentation

## 2014-01-13 LAB — URINALYSIS, ROUTINE W REFLEX MICROSCOPIC
Bilirubin Urine: NEGATIVE
Glucose, UA: NEGATIVE mg/dL
Hgb urine dipstick: NEGATIVE
Ketones, ur: 15 mg/dL — AB
Leukocytes, UA: NEGATIVE
Nitrite: NEGATIVE
Protein, ur: NEGATIVE mg/dL
Specific Gravity, Urine: 1.008 (ref 1.005–1.030)
Urobilinogen, UA: 0.2 mg/dL (ref 0.0–1.0)
pH: 7 (ref 5.0–8.0)

## 2014-01-13 MED ORDER — MORPHINE SULFATE 4 MG/ML IJ SOLN
4.0000 mg | Freq: Once | INTRAMUSCULAR | Status: AC
  Administered 2014-01-13: 4 mg via INTRAVENOUS
  Filled 2014-01-13: qty 1

## 2014-01-13 NOTE — ED Notes (Addendum)
Pt arrives via [facility], L2-L4 lumbar fusion six days ago. Pt from [facility]. Two percocets take PTA with no relief.

## 2014-01-13 NOTE — ED Notes (Signed)
Bladder scanner- 

## 2014-01-13 NOTE — ED Notes (Signed)
Pt calling out multiple times for pain medications, states she only got percocet from SNF PTA, did not get "tylenol, ibuprofen, and that cream for my chest." States she had "bowel movement just coming out of me" after several days of constipation post surgery, states she "reacted" to percocet. This RN informed pt that EDP is aware of her situation and will be in to see her. Pt verbalizes understanding of this plan.  Pt states surgeon's name is Futures traderKritzer.

## 2014-01-13 NOTE — ED Provider Notes (Signed)
CSN: [id number]     Arrival date & time 01/13/14  2207 History   First MD Initiated Contact with Patient 01/13/14 2303     Chief Complaint  Patient presents with  . Back Pain     (Consider location/radiation/quality/duration/timing/severity/associated sxs/prior Treatment) HPI Comments: Pt comes in cc of back pain. Pt is s/p L2-3 L3-4 anterolateral fusion and L2-3-4 segmental instrumentation with Pathfinder percutaneous pedicle screw system on 9/29, discharged to rehab. Pt reports increasing pain. Pain is primarily in her LLE and her lumbar spine. She has no n/v/f/c. Pt denies any NEW associated numbness, weakness, urinary incontinence, urinary retention, bowel incontinence, saddle anesthesia. Pt does state that y'day she almost had a BM accident. Pt is taking meds as prescribed. Pt is getting PT. She states that she is walking with walker. Pt doesn't think the current pain is any worse than usual, but it didn't respond to percocet as it typically does.      Patient is a 58 y.o. female presenting with back pain. The history is provided by the patient.  Back Pain Associated symptoms: no abdominal pain, no chest pain, no dysuria, no headaches and no numbness     Past Medical History  Diagnosis Date  . Endometriosis    Past Surgical History  Procedure Laterality Date  . Breast surgery  1987    bx  . Tonsillectomy  1958  . Oophorectomy  1977    left  . Appendectomy  1963  . Tubal ligation  1986  . Colonoscopy w/ polypectomy    . Planters wart Left 2005  . Anterior lat lumbar fusion Left 01/05/2014    Procedure: LUMBAR TWO TO THREE, LUMBAR LUMBAR THREE TO FOUR, ANTERIOR LATERAL LUMBAR FUSION 2 LEVELS;  Surgeon: Reinaldo Meeker, MD;  Location: [facility];  Service: Neurosurgery;  Laterality: Left;   Family History  Problem Relation Age of Onset  . Arthritis Neg Hx     family hx  . Cancer Neg Hx     prostate ca -family hx  . Heart disease Neg Hx     family hx   History   Substance Use Topics  . Smoking status: Former Smoker -- 1.00 packs/day for 8 years    Types: Cigarettes    Quit date: 01/17/1994  . Smokeless tobacco: Not on file  . Alcohol Use: 3.6 oz/week    6 Cans of beer per week   OB History   Grav Para Term Preterm Abortions TAB SAB Ect Mult Living                 Review of Systems  Constitutional: Positive for activity change.  Respiratory: Negative for shortness of breath.   Cardiovascular: Negative for chest pain.  Gastrointestinal: Negative for nausea, vomiting and abdominal pain.  Genitourinary: Negative for dysuria.  Musculoskeletal: Positive for back pain. Negative for joint swelling and neck pain.  Skin: Negative for rash.  Allergic/Immunologic: Negative for immunocompromised state.  Neurological: Negative for numbness and headaches.      Allergies  Bee venom; Penicillins; and Sulfa antibiotics  Home Medications   Prior to Admission medications   Medication Sig Start Date End Date Taking? Authorizing Provider  acetaminophen (TYLENOL) 500 MG tablet Take 500 mg by mouth every 8 (eight) hours as needed for mild pain.   Yes Historical Provider, MD  Dermatological Products, Misc. (NEOSALUS CP) CREA Apply 1 application topically 3 (three) times daily as needed. For rash   Yes Historical Provider, MD  ibuprofen (  ADVIL,MOTRIN) 800 MG tablet Take 800 mg by mouth 3 (three) times daily as needed for moderate pain.   Yes Historical Provider, MD  oxyCODONE-acetaminophen (PERCOCET/ROXICET) 5-325 MG per tablet Take 1-2 tablets by mouth every 4 (four) hours as needed for moderate pain. 01/11/14  Yes Reinaldo Meeker, MD  HYDROmorphone (DILAUDID) 2 MG tablet Take 1 tablet (2 mg total) by mouth every 4 (four) hours as needed for severe pain. 01/14/14   Derwood Kaplan, MD  ibuprofen (ADVIL,MOTRIN) 600 MG tablet Take 1 tablet (600 mg total) by mouth every 6 (six) hours as needed. 01/14/14   Derwood Kaplan, MD  methocarbamol (ROBAXIN) 500 MG tablet  Take 1 tablet (500 mg total) by mouth 2 (two) times daily. 01/14/14   Ankit Nanavati, MD   BP 155/69  Pulse 67  Temp(Src) 98.1 F (36.7 C) (Oral)  Resp 18  SpO2 100% Physical Exam  Nursing note and vitals reviewed. Constitutional: She is oriented to person, place, and time. She appears well-developed and well-nourished.  HENT:  Head: Normocephalic and atraumatic.  Eyes: EOM are normal. Pupils are equal, round, and reactive to light.  Neck: Neck supple.  Cardiovascular: Normal rate, regular rhythm and normal heart sounds.   No murmur heard. Pulmonary/Chest: Effort normal. No respiratory distress.  Abdominal: Soft. She exhibits no distension. There is no tenderness. There is no rebound and no guarding.  Musculoskeletal:  Pt has tenderness over the lumbar region, diffuse No step offs, no erythema Pt has 2+ patellar reflex bilaterally, may be slight hyperreflexia Able to discriminate between sharp and dull, right better than left, which is baseline. Rectal tone present    Neurological: She is alert and oriented to person, place, and time.  Skin: Skin is warm and dry.    ED Course  Procedures (including critical care time) Labs Review Labs Reviewed  BASIC METABOLIC PANEL - Abnormal; Notable for the following:    Potassium 3.1 (*)    Glucose, Bld 113 (*)    Creatinine, Ser 0.46 (*)    Anion gap 18 (*)    All other components within normal limits  URINALYSIS, ROUTINE W REFLEX MICROSCOPIC - Abnormal; Notable for the following:    Ketones, ur 15 (*)    All other components within normal limits  CBC WITH DIFFERENTIAL - Abnormal; Notable for the following:    RBC 3.71 (*)    Hemoglobin 10.9 (*)    HCT 31.9 (*)    Neutrophils Relative % 81 (*)    Lymphocytes Relative 9 (*)    All other components within normal limits  SEDIMENTATION RATE - Abnormal; Notable for the following:    Sed Rate 50 (*)    All other components within normal limits  CBC WITH DIFFERENTIAL  C-REACTIVE  PROTEIN    Imaging Review No results found.   EKG Interpretation None      MDM   Final diagnoses:  Back pain at L4-L5 level  Lower extremity pain, left    Pt comes in with back pain and LLE pain. She has no new neuro deficits, no red flags on hx suggestive of cord compression, with neuro exam supporting the same. Pt has no fevers and labs not showing any elevated EC, exam showing healing incision site.  Pt however uncomfortable, pain not responsing to oral meds. Overnight, pt received 3 rounds of iv narcotics, and muscle relaxant. Sx improved, but not significantly.  Spoke with Dr. Jule Ser around 6:00 am, plan was to add toradol and continue aggressive  pain management, and reassess by 8 am.  At reassessment, pain down to 7/10, pt not comfortable going home, as pain worse with leg movement.  Spoke with Dr. Gerlene FeeKritzer, pt's Neurosurgeon, who also doesn't think there is any cord compression. He agrees if no infection, pt needs to be given aggressive pain control, and he will see her on Monday. He requested adding decadron.  Pt informed of the discussion with Dr. Gerlene FeeKritzer. She is OK with the plan. She will be heading back to the rehab, but will return if pain not responding to new regimen. Otherwise, she will see Nsurgery on Monday.    Derwood KaplanAnkit Nanavati, MD 01/14/14 [phone]

## 2014-01-14 LAB — BASIC METABOLIC PANEL
Anion gap: 18 — ABNORMAL HIGH (ref 5–15)
BUN: 8 mg/dL (ref 6–23)
CO2: 23 mEq/L (ref 19–32)
Calcium: 9 mg/dL (ref 8.4–10.5)
Chloride: 98 mEq/L (ref 96–112)
Creatinine, Ser: 0.46 mg/dL — ABNORMAL LOW (ref 0.50–1.10)
GFR calc Af Amer: >90 mL/min (ref >90)
GFR calc non Af Amer: >90 mL/min (ref >90)
Glucose, Bld: 113 mg/dL — ABNORMAL HIGH (ref 70–99)
Potassium: 3.1 mEq/L — ABNORMAL LOW (ref 3.7–5.3)
Sodium: 139 mEq/L (ref 137–147)

## 2014-01-14 LAB — CBC WITH DIFFERENTIAL/PLATELET
Basophils Absolute: 0 10*3/uL (ref 0.0–0.1)
Basophils Relative: 1 % (ref 0–1)
Eosinophils Absolute: 0.1 10*3/uL (ref 0.0–0.7)
Eosinophils Relative: 1 % (ref 0–5)
HCT: 31.9 % — ABNORMAL LOW (ref 36.0–46.0)
Hemoglobin: 10.9 g/dL — ABNORMAL LOW (ref 12.0–15.0)
Lymphocytes Relative: 9 % — ABNORMAL LOW (ref 12–46)
Lymphs Abs: 0.7 10*3/uL (ref 0.7–4.0)
MCH: 29.4 pg (ref 26.0–34.0)
MCHC: 34.2 g/dL (ref 30.0–36.0)
MCV: 86 fL (ref 78.0–100.0)
Monocytes Absolute: 0.7 10*3/uL (ref 0.1–1.0)
Monocytes Relative: 8 % (ref 3–12)
Neutro Abs: 6.6 10*3/uL (ref 1.7–7.7)
Neutrophils Relative %: 81 % — ABNORMAL HIGH (ref 43–77)
Platelets: 290 10*3/uL (ref 150–400)
RBC: 3.71 MIL/uL — ABNORMAL LOW (ref 3.87–5.11)
RDW: 12.1 % (ref 11.5–15.5)
WBC: 8.1 10*3/uL (ref 4.0–10.5)

## 2014-01-14 LAB — SEDIMENTATION RATE: Sed Rate: 50 mm/hr — ABNORMAL HIGH (ref 0–22)

## 2014-01-14 LAB — C-REACTIVE PROTEIN: CRP: 8.6 mg/dL — ABNORMAL HIGH (ref <0.60)

## 2014-01-14 MED ORDER — HYDROMORPHONE HCL 1 MG/ML IJ SOLN
1.0000 mg | Freq: Once | INTRAMUSCULAR | Status: AC
  Administered 2014-01-14: 1 mg via INTRAVENOUS
  Filled 2014-01-14: qty 1

## 2014-01-14 MED ORDER — DEXAMETHASONE SODIUM PHOSPHATE 4 MG/ML IJ SOLN
10.0000 mg | Freq: Once | INTRAMUSCULAR | Status: AC
  Administered 2014-01-14: 10 mg via INTRAVENOUS
  Filled 2014-01-14: qty 3

## 2014-01-14 MED ORDER — IBUPROFEN 600 MG PO TABS: 600.0000 mg | ORAL_TABLET | Freq: Four times a day (QID) | ORAL | Status: DC | PRN

## 2014-01-14 MED ORDER — MORPHINE SULFATE 4 MG/ML IJ SOLN
4.0000 mg | Freq: Once | INTRAMUSCULAR | Status: AC
  Administered 2014-01-14: 4 mg via INTRAVENOUS
  Filled 2014-01-14: qty 1

## 2014-01-14 MED ORDER — DIAZEPAM 2 MG PO TABS
2.0000 mg | ORAL_TABLET | Freq: Once | ORAL | Status: AC
  Administered 2014-01-14: 2 mg via ORAL
  Filled 2014-01-14: qty 1

## 2014-01-14 MED ORDER — METHOCARBAMOL 500 MG PO TABS: 500.0000 mg | ORAL_TABLET | Freq: Two times a day (BID) | ORAL | Status: DC

## 2014-01-14 MED ORDER — KETOROLAC TROMETHAMINE 30 MG/ML IJ SOLN
30.0000 mg | Freq: Once | INTRAMUSCULAR | Status: AC
  Administered 2014-01-14: 30 mg via INTRAVENOUS
  Filled 2014-01-14: qty 1

## 2014-01-14 MED ORDER — HYDROMORPHONE HCL 2 MG PO TABS: 2.0000 mg | ORAL_TABLET | ORAL | Status: DC | PRN

## 2014-01-14 NOTE — ED Notes (Signed)
Report given to [facility] transport team.  They both verbalized understanding.  Pt.has no belongings

## 2014-01-14 NOTE — ED Notes (Signed)
Discharge instructions given to Natasha Frederick at Natasha Frederick.  She verbalized understanding.

## 2014-01-14 NOTE — ED Notes (Signed)
Updated on plan of care.  Pt. To be transferred  Back to [facility].   Pt. Did not bring any belongings.  Pt. And pt.s husband verbalized understanding.

## 2014-01-14 NOTE — Discharge Instructions (Signed)
Please see the Neurosurgeon on Monday. Take the pain medicine as prescribed. If pain medicine are too strong, ensure you dont overdose.  Please return to the ER if your symptoms worsen; you have increased pain, fevers, chills, inability to keep any medications down, confusion. Otherwise see the outpatient doctor as requested.   Back Pain, Adult Low back pain is very common. About 1 in 5 people have back pain.The cause of low back pain is rarely dangerous. The pain often gets better over time.About half of people with a sudden onset of back pain feel better in just 2 weeks. About 8 in 10 people feel better by 6 weeks.  CAUSES Some common causes of back pain include:  Strain of the muscles or ligaments supporting the spine.  Wear and tear (degeneration) of the spinal discs.  Arthritis.  Direct injury to the back. DIAGNOSIS Most of the time, the direct cause of low back pain is not known.However, back pain can be treated effectively even when the exact cause of the pain is unknown.Answering your caregiver's questions about your overall health and symptoms is one of the most accurate ways to make sure the cause of your pain is not dangerous. If your caregiver needs more information, he or she may order lab work or imaging tests (X-rays or MRIs).However, even if imaging tests show changes in your back, this usually does not require surgery. HOME CARE INSTRUCTIONS For many people, back pain returns.Since low back pain is rarely dangerous, it is often a condition that people can learn to [facility] their own.   Remain active. It is stressful on the back to sit or stand in one place. Do not sit, drive, or stand in one place for more than 30 minutes at a time. Take short walks on level surfaces as soon as pain allows.Try to increase the length of time you walk each day.  Do not stay in bed.Resting more than 1 or 2 days can delay your recovery.  Do not avoid exercise or work.Your body is  made to move.It is not dangerous to be active, even though your back may hurt.Your back will likely heal faster if you return to being active before your pain is gone.  Pay attention to your body when you bend and lift. Many people have less discomfortwhen lifting if they bend their knees, keep the load close to their bodies,and avoid twisting. Often, the most comfortable positions are those that put less stress on your recovering back.  Find a comfortable position to sleep. Use a firm mattress and lie on your side with your knees slightly bent. If you lie on your back, put a pillow under your knees.  Only take over-the-counter or prescription medicines as directed by your caregiver. Over-the-counter medicines to reduce pain and inflammation are often the most helpful.Your caregiver may prescribe muscle relaxant drugs.These medicines help dull your pain so you can more quickly return to your normal activities and healthy exercise.  Put ice on the injured area.  Put ice in a plastic bag.  Place a towel between your skin and the bag.  Leave the ice on for 15-20 minutes, 03-04 times a day for the first 2 to 3 days. After that, ice and heat may be alternated to reduce pain and spasms.  Ask your caregiver about trying back exercises and gentle massage. This may be of some benefit.  Avoid feeling anxious or stressed.Stress increases muscle tension and can worsen back pain.It is important to recognize when you  are anxious or stressed and learn ways to manage it.Exercise is a great option. SEEK MEDICAL CARE IF:  You have pain that is not relieved with rest or medicine.  You have pain that does not improve in 1 week.  You have new symptoms.  You are generally not feeling well. SEEK IMMEDIATE MEDICAL CARE IF:   You have pain that radiates from your back into your legs.  You develop new bowel or bladder control problems.  You have unusual weakness or numbness in your arms or  legs.  You develop nausea or vomiting.  You develop abdominal pain.  You feel faint. Document Released: 03/26/2005 Document Revised: 09/25/2011 Document Reviewed: 07/28/2013 [facility] Patient Information 2015 [employer] [place], [place]. This information is not intended to replace advice given to you by your health care provider. Make sure you discuss any questions you have with your health care provider.

## 2014-01-14 NOTE — ED Notes (Signed)
Reviewed discharge instructions with pt.s husband and pt.  Both verbalized understanding.

## 2014-01-28 ENCOUNTER — Encounter (HOSPITAL_COMMUNITY): Payer: Self-pay | Admitting: Neurosurgery

## 2014-03-08 ENCOUNTER — Ambulatory Visit: Payer: Federal, State, Local not specified - PPO | Attending: Neurosurgery

## 2014-03-08 DIAGNOSIS — M79652 Pain in left thigh: Secondary | ICD-10-CM | POA: Diagnosis not present

## 2014-03-08 DIAGNOSIS — R531 Weakness: Secondary | ICD-10-CM | POA: Diagnosis not present

## 2014-03-08 DIAGNOSIS — R29898 Other symptoms and signs involving the musculoskeletal system: Secondary | ICD-10-CM

## 2014-03-08 DIAGNOSIS — R269 Unspecified abnormalities of gait and mobility: Secondary | ICD-10-CM | POA: Insufficient documentation

## 2014-03-08 DIAGNOSIS — M419 Scoliosis, unspecified: Secondary | ICD-10-CM | POA: Diagnosis not present

## 2014-03-08 DIAGNOSIS — Z5189 Encounter for other specified aftercare: Secondary | ICD-10-CM | POA: Insufficient documentation

## 2014-03-08 DIAGNOSIS — M25552 Pain in left hip: Secondary | ICD-10-CM | POA: Diagnosis present

## 2014-03-08 NOTE — Therapy (Signed)
Physical Therapy Evaluation  Patient Details  Name: Natasha Frederick MRN: [id number] Date of Birth: 01/16/1956  Encounter Date: 03/08/2014      PT End of Session - 03/08/14 1414    Visit Number 1   Number of Visits 24   Date for PT Re-Evaluation 04/07/14   PT Start Time 0110   PT Stop Time 0210   PT Time Calculation (min) 60 min   Activity Tolerance Patient tolerated treatment well   Behavior During Therapy [facility] for tasks assessed/performed      Past Medical History  Diagnosis Date  . Endometriosis     Past Surgical History  Procedure Laterality Date  . Breast surgery  1987    bx  . Tonsillectomy  1958  . Oophorectomy  1977    left  . Appendectomy  1963  . Tubal ligation  1986  . Colonoscopy w/ polypectomy    . Planters wart Left 2005  . Anterior lat lumbar fusion Left 01/05/2014    Procedure: LUMBAR TWO TO THREE, LUMBAR LUMBAR THREE TO FOUR, ANTERIOR LATERAL LUMBAR FUSION 2 LEVELS;  Surgeon: Reinaldo Meekerandy O Kritzer, MD;  Location: [facility];  Service: Neurosurgery;  Laterality: Left;    There were no vitals taken for this visit.  Visit Diagnosis:  Weakness of left hip - Plan: PT plan of care cert/re-cert, PT plan of care cert/re-cert  Pain in joint, pelvic region and thigh, left - Plan: PT plan of care cert/re-cert  Abnormality of gait - Plan: PT plan of care cert/re-cert      Subjective Assessment - 03/08/14 1320    Symptoms Post lumbar fusion. she has pain wiht sciatica to LT knee and weakness in lower leg. she reports tenderness in Lt thighand hip.   RT leg is longer. she reports decr coordination and balance. she was in SNF post surgery and was there until mid November 2015   Pertinent History She reported scoliosis over 3 months to in June or July 2015 she began to ahve to use a walker to walk due to pain and wweakness   Limitations Walking;Standing   How long can you sit comfortably? 45 min max   How long can you stand comfortably? 30 min   How long can you walk  comfortably? 30 minutes   Patient Stated Goals Walk without walker and decr sciatic.    Currently in Pain? Yes   Pain Score 6    Pain Location Hip   Pain Orientation Left   Pain Descriptors / Indicators Radiating;Aching   Pain Radiating Towards Innto LT anterior thigh   Pain Onset More than a month ago   Pain Frequency Constant   Aggravating Factors  Walking, bending   Pain Relieving Factors ibuprphen   Effect of Pain on Daily Activities She has been limited with Home task for at least 2 years   Multiple Pain Sites No          [facility] PT Assessment - 03/08/14 1332    Assessment   Medical Diagnosis acquired scolisois   Onset Date 01/05/14   Next MD Visit 03/22/14   Prior Therapy 03/02/14   Precautions   Precautions Back  No bending , twisting, arching, lifiting   Balance Screen   Has the patient fallen in the past 6 months Yes   How many times? 3   Has the patient had a decrease in activity level because of a fear of falling?  Yes   Is the patient reluctant to leave their  home because of a fear of falling?  Yes   Home Environment   Living Enviornment Private residence   Living Arrangements Spouse/significant other   Home Access Stairs to enter   Entrance Stairs-Number of Steps 3   Entrance Stairs-Rails Can reach both   Prior Function   Level of Independence Requires assistive device for independence   Strength   Overall Strength Deficits  RT LE WFL  LT hip 1/5 to 3-/5 , LT knee ext 3/5  flex 3/5   Bed Mobility   Bed Mobility Sit to Supine;Supine to Sit   Supine to Sit 6: Modified independent (Device/Increase time)   Sit to Supine 6: Modified independent (Device/Increase time)   Transfers   Transfers --   Sit to Stand 6: Modified independent (Device/Increase time)   Stand to Sit 6: Modified independent (Device/Increase time)   Ambulation/Gait   Ambulation/Gait --  Rolling walker without assist   Gait Pattern Decreased hip/knee flexion - left;Decreased weight shift to  left;Left genu recurvatum;Trendelenburg   6 Minute Walk- Baseline   6 Minute Walk- Baseline --  575 feet with rolling walker            PT Education - 03/08/14 1413    Education provided Yes   Education Details weakness and relation to nerve damage and 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   Person(s) Educated Patient;Spouse   Methods Explanation   Comprehension Verbalized understanding          PT Short Term Goals - 03/08/14 1813    PT SHORT TERM GOAL #1   Title Independent with inital HEP   Time 4   Period Weeks   Status New   PT SHORT TERM GOAL #2   Title Improve LT quads stength to 4/5 to improve gait stability   Time 4   Period Weeks   Status New   PT SHORT TERM GOAL #3   Title Lift Lt leg onto the bed without UE assist   Time 4   Period Weeks   Status New   PT SHORT TERM GOAL #4   Title Reports pain improved to 5/10   Time 4   Period Days   Status New          PT Long Term Goals - 03/08/14 1815    PT LONG TERM GOAL #1   Title Independent with Advanced HEP   Time 12   Period Weeks   Status New   PT LONG TERM GOAL #2   Title Pt will report pain improved to 2/10 geneerally and 3-4/10 with wlaking   Time 12   Period Weeks   Status New   PT LONG TERM GOAL #3   Title improve LT hip strength to 4-/5 or better to improve gait and independne t with least restricted device.    Time 12   Period Weeks   Status New   PT LONG TERM GOAL #4   Title Return to light home tasks safely   Time 12   Period Weeks   Status New          Plan - 03/08/14 1414    Clinical Impression Statement Ms Natasha Frederick is significantly weak in LT hip and thigh that she is not safe to walk withut RW. 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   She should benefit from Pt to improve funcitonal mobility and LE strength.    Pt will benefit from skilled therapeutic intervention in order to improve on  the following deficits Difficulty walking;Decreased strength;Decreased mobility;Decreased  endurance;Decreased activity tolerance;Decreased balance;Pain   Rehab Potential Good   PT Frequency 2x / week   PT Duration 12 weeks   PT Treatment/Interventions ADLs/Self Care Home Management;Patient/family education;Therapeutic exercise;Gait training;Balance training;Electrical Stimulation;Functional mobility training;Moist Heat   PT Next Visit Plan Review HEP. PT to bring   PT Home Exercise Plan Review HEP   Consulted and Agree with Plan of Care Patient          G-Codes - 19-Mar-2014 1427    Functional Assessment Tool Used FOTO   Functional Limitation Mobility: Walking and moving around   Mobility: Walking and Moving Around Current Status [phone]) At least 60 percent but less than 80 percent impaired, limited or restricted   Mobility: Walking and Moving Around Goal Status [phone]) At least 40 percent but less than 60 percent impaired, limited or restricted      Problem List Patient Active Problem List   Diagnosis Date Noted  . Scoliosis of lumbar spine 01/05/2014                                        Her LT hip and knee feels significant with  crepitus. Has she had her hip joint assesssed? This may contribute to her pain and some of the weakness/ instability with gait .       Caprice Red PT March 19, 2014, 6:21 PM

## 2014-03-08 NOTE — Patient Instructions (Signed)
Continue to use RW for all ambulation

## 2014-03-09 ENCOUNTER — Telehealth: Payer: Self-pay | Admitting: *Deleted

## 2014-03-09 NOTE — Telephone Encounter (Signed)
per pt husband to workout with Brett CanalesSteve was too much on her. per the husband they will get back with us at another time but for now to cancel all the appts...td

## 2014-03-10 ENCOUNTER — Encounter: Payer: Federal, State, Local not specified - PPO | Admitting: Rehabilitation

## 2014-03-10 ENCOUNTER — Ambulatory Visit: Payer: Federal, State, Local not specified - PPO | Attending: Neurosurgery

## 2014-03-10 DIAGNOSIS — M79652 Pain in left thigh: Secondary | ICD-10-CM | POA: Insufficient documentation

## 2014-03-10 DIAGNOSIS — R269 Unspecified abnormalities of gait and mobility: Secondary | ICD-10-CM | POA: Insufficient documentation

## 2014-03-10 DIAGNOSIS — Z5189 Encounter for other specified aftercare: Secondary | ICD-10-CM | POA: Diagnosis not present

## 2014-03-10 DIAGNOSIS — R531 Weakness: Secondary | ICD-10-CM | POA: Diagnosis not present

## 2014-03-10 DIAGNOSIS — M419 Scoliosis, unspecified: Secondary | ICD-10-CM | POA: Insufficient documentation

## 2014-03-10 DIAGNOSIS — M25552 Pain in left hip: Secondary | ICD-10-CM | POA: Insufficient documentation

## 2014-03-10 DIAGNOSIS — Z981 Arthrodesis status: Secondary | ICD-10-CM

## 2014-03-10 NOTE — Therapy (Signed)
[facility] [address] [place], [place], [address] Phone: [phone]   Fax:  [phone]  Physical Therapy Treatment  Patient Details  Name: Natasha BeckmannJudith Willis MRN: [id number] Date of Birth: 10/12/1955  Encounter Date: 03/10/2014      PT End of Session - 03/10/14 1051    Visit Number 2   Number of Visits 24   Date for PT Re-Evaluation 04/07/14   PT Start Time 1020   PT Stop Time 1100   PT Time Calculation (min) 40 min   Activity Tolerance Patient tolerated treatment well      Past Medical History  Diagnosis Date  . Endometriosis     Past Surgical History  Procedure Laterality Date  . Breast surgery  1987    bx  . Tonsillectomy  1958  . Oophorectomy  1977    left  . Appendectomy  1963  . Tubal ligation  1986  . Colonoscopy w/ polypectomy    . Planters wart Left 2005  . Anterior lat lumbar fusion Left 01/05/2014    Procedure: LUMBAR TWO TO THREE, LUMBAR LUMBAR THREE TO FOUR, ANTERIOR LATERAL LUMBAR FUSION 2 LEVELS;  Surgeon: Reinaldo Meekerandy O Kritzer, MD;  Location: [facility];  Service: Neurosurgery;  Laterality: Left;    There were no vitals taken for this visit.  Visit Diagnosis:  S/P lumbar fusion      Subjective Assessment - 03/10/14 1032    Symptoms Pt reports pain today 2/10, but tooks a pain pill before therapy. [facility] and reports compliance with perform 2 times a day.    Currently in Pain? Yes   Pain Score 2    Pain Location Back   Pain Orientation Left   Pain Descriptors / Indicators Aching;Radiating            [facility] Adult PT Treatment/Exercise - 03/10/14 0001    Exercises   Exercises Knee/Hip   Knee/Hip Exercises: Standing   Heel Raises 20 reps   Heel Raises Limitations HEP   Knee Flexion 20 reps   Knee Flexion Limitations HEP   Hip ADduction 20 reps  ABDUCTION, L only    Hip ADduction Limitations HEP   Other Standing Knee Exercises HIp Extension 20 x L only    Knee/Hip Exercises:  Seated   Long Arc Quad Strengthening;20 reps   Long Arc Quad Weight 0 lbs.   Long Arc Quad Limitations HEP, 10 sec hold, 20 reps   Knee/Hip Exercises: Supine   Other Supine Knee Exercises Core brace with UE Flex, iso hip flex, and march 5 sec hold, 5 reps each.   to review for home HEP          PT Education - 03/10/14 1044    Education provided Yes   Education Details Continue HEP    Person(s) Educated Patient   Methods Explanation;Demonstration   Comprehension Verbalized understanding              Plan - 03/10/14 1051    Clinical Impression Statement No significant changes since eval last visit, 2 days ago. Pt is compliant and demos understanding of HEP.    Pt will benefit from skilled therapeutic intervention in order to improve on the following deficits Difficulty walking;Decreased strength;Decreased mobility;Decreased endurance;Decreased activity tolerance;Decreased balance;Pain   PT Next Visit Plan Progress core HEP. L LE strengthening. Recumbant bike, per pt request.  Problem List Patient Active Problem List   Diagnosis Date Noted  . Scoliosis of lumbar spine 01/05/2014   Haze RushingJessica Renzi, PT, DPT 03/10/2014 11:04 AM Phone: [phone] Fax: [phone]

## 2014-03-15 ENCOUNTER — Encounter: Payer: Federal, State, Local not specified - PPO | Admitting: Rehabilitation

## 2014-03-15 ENCOUNTER — Ambulatory Visit: Payer: Federal, State, Local not specified - PPO | Admitting: Rehabilitation

## 2014-03-15 DIAGNOSIS — R29898 Other symptoms and signs involving the musculoskeletal system: Secondary | ICD-10-CM

## 2014-03-15 DIAGNOSIS — M25552 Pain in left hip: Secondary | ICD-10-CM | POA: Diagnosis not present

## 2014-03-15 DIAGNOSIS — R269 Unspecified abnormalities of gait and mobility: Secondary | ICD-10-CM

## 2014-03-15 DIAGNOSIS — Z981 Arthrodesis status: Secondary | ICD-10-CM

## 2014-03-15 NOTE — Therapy (Signed)
Outpatient Rehabilitation [facility] [place], [place], [address] Phone: [phone]   Fax:  [phone]  Physical Therapy Treatment  Patient Details  Name: Natasha BeckmannJudith Gallaher MRN: [id number] Date of Birth: 05/21/1955  Encounter Date: 03/15/2014      PT End of Session - 03/15/14 1201    Visit Number 3   Number of Visits 24   Date for PT Re-Evaluation 04/07/14   PT Start Time 1152   PT Stop Time 1230   PT Time Calculation (min) 38 min      Past Medical History  Diagnosis Date  . Endometriosis     Past Surgical History  Procedure Laterality Date  . Breast surgery  1987    bx  . Tonsillectomy  1958  . Oophorectomy  1977    left  . Appendectomy  1963  . Tubal ligation  1986  . Colonoscopy w/ polypectomy    . Planters wart Left 2005  . Anterior lat lumbar fusion Left 01/05/2014    Procedure: LUMBAR TWO TO THREE, LUMBAR LUMBAR THREE TO FOUR, ANTERIOR LATERAL LUMBAR FUSION 2 LEVELS;  Surgeon: Reinaldo Meekerandy O Kritzer, MD;  Location: [facility];  Service: Neurosurgery;  Laterality: Left;    There were no vitals taken for this visit.  Visit Diagnosis:  S/P lumbar fusion  Weakness of left hip  Pain in joint, pelvic region and thigh, left  Abnormality of gait      Subjective Assessment - 03/15/14 1158    Symptoms 2/10 pain, doing really good, took pain meds, 4/10 prior to meds   Pain Score 2    Pain Location Back   Pain Orientation Left   Aggravating Factors  leg lifts, wearing bracein sitting   Pain Relieving Factors position changes   Multiple Pain Sites No            [facility] Adult PT Treatment/Exercise - 03/15/14 1217    Lumbar Exercises: Supine   Isometric Hip Flexion 10 reps   Knee/Hip Exercises: Aerobic   Stationary Bike Nustep level 3 5 min UE/LE   Knee/Hip Exercises: Standing   Heel Raises 10 reps   Knee Flexion 10 reps   Hip ADduction 10 reps   Knee/Hip Exercises: Seated   Long Arc Quad 15 reps;Weights   Long Arc Quad Weight 2 lbs.    Heel Slides AAROM;10 reps   Other Seated Knee Exercises seated march  x10   Knee/Hip Exercises: Supine   Short Arc Quad Sets Left;20 reps  2#   Other Supine Knee Exercises hamstring curls single and bilateral in pysioball, needs assist                Plan - 03/15/14 1209    Clinical Impression Statement Pt reports decreased Sciatic pain since beginning P.T., improved tolerance to exercises, and improved Left knee extension ROM    PT Next Visit Plan Continue Nustep, LE strength      Problem List Patient Active Problem List   Diagnosis Date Noted  . Scoliosis of lumbar spine 01/05/2014    Sherrie MustacheDonoho, Jessica McGee, PTA 03/15/2014, 5:26 PM

## 2014-03-17 ENCOUNTER — Ambulatory Visit: Payer: Federal, State, Local not specified - PPO

## 2014-03-17 DIAGNOSIS — Z981 Arthrodesis status: Secondary | ICD-10-CM

## 2014-03-17 DIAGNOSIS — R269 Unspecified abnormalities of gait and mobility: Secondary | ICD-10-CM

## 2014-03-17 DIAGNOSIS — M25552 Pain in left hip: Secondary | ICD-10-CM

## 2014-03-17 DIAGNOSIS — R29898 Other symptoms and signs involving the musculoskeletal system: Secondary | ICD-10-CM

## 2014-03-17 NOTE — Therapy (Signed)
[facility] [address] [place], [place], [address] Phone: [phone]   Fax:  [phone]  Physical Therapy Treatment  Patient Details  Name: Natasha Frederick MRN: [id number] Date of Birth: 06/05/1955  Encounter Date: 03/17/2014      PT End of Session - 03/17/14 1154    Visit Number 4   Number of Visits 24   Date for PT Re-Evaluation 04/07/14   PT Start Time 1100   PT Stop Time 1150   PT Time Calculation (min) 50 min   Activity Tolerance Patient tolerated treatment well   Behavior During Therapy [facility] for tasks assessed/performed      Past Medical History  Diagnosis Date  . Endometriosis     Past Surgical History  Procedure Laterality Date  . Breast surgery  1987    bx  . Tonsillectomy  1958  . Oophorectomy  1977    left  . Appendectomy  1963  . Tubal ligation  1986  . Colonoscopy w/ polypectomy    . Planters wart Left 2005  . Anterior lat lumbar fusion Left 01/05/2014    Procedure: LUMBAR TWO TO THREE, LUMBAR LUMBAR THREE TO FOUR, ANTERIOR LATERAL LUMBAR FUSION 2 LEVELS;  Surgeon: Reinaldo Meekerandy O Kritzer, MD;  Location: [facility];  Service: Neurosurgery;  Laterality: Left;    There were no vitals taken for this visit.  Visit Diagnosis:  S/P lumbar fusion  Weakness of left hip  Pain in joint, pelvic region and thigh, left  Abnormality of gait      Subjective Assessment - 03/17/14 1103    Symptoms back and scatic pain   Currently in Pain? Yes   Pain Score 3    Pain Location Back  LT thigh   Pain Orientation Left   Pain Descriptors / Indicators Aching;Radiating   Pain Frequency Constant   Aggravating Factors  lifting leg, wearing brace   Pain Relieving Factors change positions   Effect of Pain on Daily Activities Limited due to need for walker and pain    Multiple Pain Sites No            [facility] Adult PT Treatment/Exercise - 03/17/14 1108    Neck Exercises: Machines for Strengthening   Other Machines for  Strengthening Nustep L3 LE only   Lumbar Exercises: Supine   Ab Set 15 reps   Bridge 10 reps  more gluteal sets    Other Supine Lumbar Exercises Asssisted LT hip flexion with cues to use abdominals to facilitate LT hip flexion   Knee/Hip Exercises: Seated   Long Arc Quad Strengthening;Left;1 set;10 reps;Weights   Long Arc Quad Weight 3 lbs.  and 10 reps with 5 pounds   Other Seated Knee Exercises feet squeez x12 with pillow for hip ER   Other Seated Knee Exercises hip sqeeze x15 5 sec  for adduction strength   Knee/Hip Exercises: Supine   Other Supine Knee Exercises core stability with soigle and double arm lifts   Knee/Hip Exercises: Sidelying   Hip ABduction AAROM;1 set;15 reps   Other Sidelying Knee Exercises LT hip IR x10    Manual Therapy   Manual Therapy --  Gentle rotation and distraction mobs to hip          PT Education - 03/17/14 1153    Education provided No              Plan - 03/17/14 1156    Clinical Impression Statement She did not have increased pain post treatment and  she exhibited improved muscle activation of LT hip   Pt will benefit from skilled therapeutic intervention in order to improve on the following deficits Difficulty walking;Decreased strength;Decreased mobility;Decreased endurance;Decreased activity tolerance;Decreased balance;Pain   Rehab Potential Good   PT Frequency 2x / week   PT Duration 12 weeks   PT Treatment/Interventions ADLs/Self Care Home Management;Patient/family education;Therapeutic exercise;Gait training;Balance training;Electrical Stimulation;Functional mobility training;Moist Heat   PT Next Visit Plan Continue Nustep, LE strength   PT Home Exercise Plan Cont facilitation   Consulted and Agree with Plan of Care Patient                               Problem List Patient Active Problem List   Diagnosis Date Noted  . Scoliosis of lumbar spine 01/05/2014    Caprice RedChasse, Stephen M PT 03/17/2014, 12:07  PM

## 2014-03-17 NOTE — Patient Instructions (Signed)
Continue HEP without changes

## 2014-03-22 ENCOUNTER — Ambulatory Visit: Payer: Federal, State, Local not specified - PPO

## 2014-03-22 ENCOUNTER — Telehealth: Payer: Self-pay | Admitting: *Deleted

## 2014-03-22 DIAGNOSIS — R29898 Other symptoms and signs involving the musculoskeletal system: Secondary | ICD-10-CM

## 2014-03-22 DIAGNOSIS — R269 Unspecified abnormalities of gait and mobility: Secondary | ICD-10-CM

## 2014-03-22 DIAGNOSIS — M25552 Pain in left hip: Secondary | ICD-10-CM | POA: Diagnosis not present

## 2014-03-22 DIAGNOSIS — Z981 Arthrodesis status: Secondary | ICD-10-CM

## 2014-03-22 NOTE — Therapy (Signed)
[facility] [address] [place], [place], [address] Phone: [phone]   Fax:  [phone]  Physical Therapy Treatment  Patient Details  Name: Natasha Frederick MRN: [id number] Date of Birth: 10/27/1955  Encounter Date: 03/22/2014      PT End of Session - 03/22/14 1351    Visit Number 5   Number of Visits 24   Date for PT Re-Evaluation 04/07/14   PT Start Time 1235   PT Stop Time 1350   PT Time Calculation (min) 75 min   Activity Tolerance Patient tolerated treatment well   Behavior During Therapy [facility] for tasks assessed/performed      Past Medical History  Diagnosis Date  . Endometriosis     Past Surgical History  Procedure Laterality Date  . Breast surgery  1987    bx  . Tonsillectomy  1958  . Oophorectomy  1977    left  . Appendectomy  1963  . Tubal ligation  1986  . Colonoscopy w/ polypectomy    . Planters wart Left 2005  . Anterior lat lumbar fusion Left 01/05/2014    Procedure: LUMBAR TWO TO THREE, LUMBAR LUMBAR THREE TO FOUR, ANTERIOR LATERAL LUMBAR FUSION 2 LEVELS;  Surgeon: Reinaldo Meekerandy O Kritzer, MD;  Location: [facility];  Service: Neurosurgery;  Laterality: Left;    There were no vitals taken for this visit.  Visit Diagnosis:  S/P lumbar fusion  Weakness of left hip  Pain in joint, pelvic region and thigh, left  Abnormality of gait      Subjective Assessment - 03/22/14 1247    Symptoms MD discontinued brace, no with bone stimulator, requested 3x/week for 4 weeks so will add.    Currently in Pain? Yes   Pain Score 2    Pain Location Back   Pain Orientation Left   Pain Descriptors / Indicators Aching   Pain Type Surgical pain   Pain Radiating Towards LT hip and anterior thigh   Pain Onset More than a month ago   Pain Frequency Constant   Aggravating Factors  Lifting leg    Pain Relieving Factors changing postiitons   Multiple Pain Sites No            [facility] Adult PT Treatment/Exercise - 03/22/14 1301    Bed Mobility   Supine to Sit 3: Mod assist   Supine to Sit Details (indicate cue type and reason) She has difficulty using UE to go to sitting. we worked this and she did better   Sit to Supine 4: Min assist   Sit to Supine - Details (indicate cue type and reason) She uses RT Le primarily   Lumbar Exercises: Supine   Ab Set 15 reps   Glut Set 10 reps   Clam 15 reps  Very limited with range on LT    Bridge 20 reps   Other Supine Lumbar Exercises Short arc 6 inches RT and LT opposite directions rotation x12 RT and LT.    Other Supine Lumbar Exercises Hip IR ER with leg extended x20   Knee/Hip Exercises: Aerobic   Stationary Bike Nustep level 3 5 min UE/LE   Knee/Hip Exercises: Seated   Long Arc Quad Strengthening;Left;1 set;15 reps  10 sec hold   Long Arc Quad Weight 3 lbs.   Other Seated Knee Exercises feet squeez x12 with pillow for hip ER   Other Seated Knee Exercises hip sqeeze x15 5 sec  for adduction strength  and foot squeeze into ball for ER strength  Knee/Hip Exercises: Supine   Other Supine Knee Exercises core stability with soigle and double arm lifts   Other Supine Knee Exercises Assited LT jip flexion with min assist and she had poor controll at end range going into extension          PT Education - 03/22/14 1351    Education provided No              Plan - 03/22/14 1352    Clinical Impression Statement She is able to move LT hip/leg better but weakness limits    Pt will benefit from skilled therapeutic intervention in order to improve on the following deficits Difficulty walking;Decreased strength;Decreased mobility;Decreased endurance;Decreased activity tolerance;Decreased balance;Pain   Rehab Potential Good   PT Frequency 3x / week   PT Duration 4 weeks  then 2x/week for 4 weeks   PT Treatment/Interventions ADLs/Self Care Home Management;Patient/family education;Therapeutic exercise;Gait training;Balance training;Electrical Stimulation;Functional  mobility training;Moist Heat   PT Next Visit Plan Continue Nustep, LE strength   PT Home Exercise Plan Cont facilitation   Consulted and Agree with Plan of Care Patient                               Problem List Patient Active Problem List   Diagnosis Date Noted  . Scoliosis of lumbar spine 01/05/2014    Caprice RedChasse, Stephen M PT 03/22/2014, 1:54 PM

## 2014-03-22 NOTE — Telephone Encounter (Signed)
appts made and printed...td 

## 2014-03-23 ENCOUNTER — Ambulatory Visit: Payer: Medicare Other

## 2014-03-23 ENCOUNTER — Ambulatory Visit: Payer: Federal, State, Local not specified - PPO

## 2014-03-23 ENCOUNTER — Ambulatory Visit (INDEPENDENT_AMBULATORY_CARE_PROVIDER_SITE_OTHER): Payer: Federal, State, Local not specified - PPO

## 2014-03-23 DIAGNOSIS — R269 Unspecified abnormalities of gait and mobility: Secondary | ICD-10-CM

## 2014-03-23 DIAGNOSIS — R29898 Other symptoms and signs involving the musculoskeletal system: Secondary | ICD-10-CM

## 2014-03-23 DIAGNOSIS — Z23 Encounter for immunization: Secondary | ICD-10-CM

## 2014-03-23 DIAGNOSIS — M25552 Pain in left hip: Secondary | ICD-10-CM | POA: Diagnosis not present

## 2014-03-23 DIAGNOSIS — Z981 Arthrodesis status: Secondary | ICD-10-CM

## 2014-03-23 NOTE — Patient Instructions (Signed)
Pt sore after session so stopped early as we see her tomorrow. 3 days in a row , I wanted to be cautious not to flare up her back or LT leg

## 2014-03-23 NOTE — Therapy (Signed)
[facility] [address] [place], [place], [address] Phone: [phone]   Fax:  [phone]  Physical Therapy Treatment  Patient Details  Name: Natasha BeckmannJudith Frederick MRN: [id number] Date of Birth: 05/17/1955  Encounter Date: 03/23/2014      PT End of Session - 03/23/14 1455    PT Start Time 1415   PT Stop Time 1455   PT Time Calculation (min) 40 min   Activity Tolerance Patient tolerated treatment well  Reported soreness in arms and LT hip /thigh   Behavior During Therapy [facility] for tasks assessed/performed      Past Medical History  Diagnosis Date  . Endometriosis     Past Surgical History  Procedure Laterality Date  . Breast surgery  1987    bx  . Tonsillectomy  1958  . Oophorectomy  1977    left  . Appendectomy  1963  . Tubal ligation  1986  . Colonoscopy w/ polypectomy    . Planters wart Left 2005  . Anterior lat lumbar fusion Left 01/05/2014    Procedure: LUMBAR TWO TO THREE, LUMBAR LUMBAR THREE TO FOUR, ANTERIOR LATERAL LUMBAR FUSION 2 LEVELS;  Surgeon: Reinaldo Meekerandy O Kritzer, MD;  [facility]: [facility];  Service: Neurosurgery;  Laterality: Left;    There were no vitals taken for this visit.  Visit Diagnosis:  Pain in joint, pelvic region and thigh, left  S/P lumbar fusion  Abnormality of gait  Weakness of left hip      Subjective Assessment - 03/23/14 1409    Symptoms Doing ok today   Currently in Pain? Yes   Pain Score 2    Pain Location Back   Pain Orientation Left   Pain Descriptors / Indicators Aching   Pain Type Surgical pain   Pain Radiating Towards Lt hip and anterior thigh   Pain Onset More than a month ago   Aggravating Factors  lifiting leg   Pain Relieving Factors changing postions   Multiple Pain Sites No            [facility] Adult PT Treatment/Exercise - 03/23/14 1412    Bed Mobility   Supine to Sit 4: Min assist   Supine to Sit Details (indicate cue type and reason) Much improved with technique to get up  from mat   Sit to Supine 5: Supervision   Exercises   Exercises --  Nustep L3 LE only 5 min   Knee/Hip Exercises: Standing   Heel Raises 15 reps   Knee Flexion AROM;15 reps   Hip ADduction AROM   Knee/Hip Exercises: Seated   Long Arc Quad Strengthening;Right;Left;1 set;15 reps   Long Arc Quad Weight 5 lbs.   Other Seated Knee Exercises feet squeez x12 with ball for hip ER   Other Seated Knee Exercises hip squeeze x15 5 sec  for adduction strength into ball   Knee/Hip Exercises: Supine   Other Supine Knee Exercises core stability with single and double arm lifts   Other Supine Knee Exercises Assited LT hip flexion with min assist and she had poor controll at end range going into extension   Knee/Hip Exercises: Sidelying   Clams x15 assited   Shoulder Exercises: Seated   Other Seated Exercises elbow curls x15 2 pounds   Other Seated Exercises overhead press 2 pounds x15          PT Education - 03/22/14 1351    Education provided No  Plan - 03/23/14 1457    Clinical Impression Statement She is improved with bed mobility today   PT Next Visit Plan Continue Nustep, LE strength   PT Home Exercise Plan Cont facilitation   Consulted and Agree with Plan of Care Patient                               Problem List Patient Active Problem List   Diagnosis Date Noted  . Scoliosis of lumbar spine 01/05/2014    Caprice RedChasse, Stephen M PT 03/23/2014, 2:59 PM

## 2014-03-24 ENCOUNTER — Encounter: Payer: Federal, State, Local not specified - PPO | Admitting: Rehabilitation

## 2014-03-24 ENCOUNTER — Ambulatory Visit: Payer: Federal, State, Local not specified - PPO

## 2014-03-24 DIAGNOSIS — R269 Unspecified abnormalities of gait and mobility: Secondary | ICD-10-CM

## 2014-03-24 DIAGNOSIS — M25552 Pain in left hip: Secondary | ICD-10-CM | POA: Diagnosis not present

## 2014-03-24 DIAGNOSIS — Z981 Arthrodesis status: Secondary | ICD-10-CM

## 2014-03-24 DIAGNOSIS — R29898 Other symptoms and signs involving the musculoskeletal system: Secondary | ICD-10-CM

## 2014-03-24 NOTE — Therapy (Signed)
[facility] [address] [place], [place], [address] Phone: [phone]   Fax:  [phone]  Physical Therapy Treatment  Patient Details  Name: Natasha Frederick MRN: [id number] Date of Birth: 01/10/1956  Encounter Date: 03/24/2014      PT End of Session - 03/24/14 1146    Visit Number 7   Number of Visits 24   Date for PT Re-Evaluation 04/07/14   PT Start Time 1100   PT Stop Time 1145   PT Time Calculation (min) 45 min   Activity Tolerance Patient tolerated treatment well   Behavior During Therapy [facility] for tasks assessed/performed      Past Medical History  Diagnosis Date  . Endometriosis     Past Surgical History  Procedure Laterality Date  . Breast surgery  1987    bx  . Tonsillectomy  1958  . Oophorectomy  1977    left  . Appendectomy  1963  . Tubal ligation  1986  . Colonoscopy w/ polypectomy    . Planters wart Left 2005  . Anterior lat lumbar fusion Left 01/05/2014    Procedure: LUMBAR TWO TO THREE, LUMBAR LUMBAR THREE TO FOUR, ANTERIOR LATERAL LUMBAR FUSION 2 LEVELS;  Surgeon: Reinaldo Meekerandy O Kritzer, MD;  Location: [facility];  Service: Neurosurgery;  Laterality: Left;    There were no vitals taken for this visit.  Visit Diagnosis:  Pain in joint, pelvic region and thigh, left  S/P lumbar fusion  Abnormality of gait  Weakness of left hip      Subjective Assessment - 03/24/14 1105    Symptoms Feel fine. Sorees left later that day   Currently in Pain? No/denies   Multiple Pain Sites No            [facility] Adult PT Treatment/Exercise - 03/24/14 1106    Bed Mobility   Supine to Sit 4: Min assist   Sit to Supine 5: Supervision   Ambulation/Gait   Ambulation/Gait --  walked 10 min without stopping without pain. with RW    Knee/Hip Exercises: Aerobic   Stationary Bike Nustep level 4 5 min LE   Knee/Hip Exercises: Seated   Long Arc Quad Right;Left;1 set;10 reps;Weights   Long Arc Quad Weight 5 lbs.  LT   7 RT   Knee/Hip Exercises: Supine   Heel Slides AROM;Left;2 sets;10 reps  into abduction   Hip Adduction Isometric 2 sets;Right;Left;10 reps   Bridges Both;2 sets;15 reps   Other Supine Knee Exercises asssited and active short arc rotation with arms and leg movements.    Other Supine Knee Exercises Assited LT hip flexion with min assist and she had poor controll at end range going into extension   Shoulder Exercises: Seated   Abduction Strengthening;Both;10 reps   ABduction Weight (lbs) 2   Other Seated Exercises elbow curls x15 2 pounds   Other Seated Exercises overhead press 2 pounds x15          PT Education - 03/24/14 1146    Education provided No          PT Short Term Goals - 03/24/14 1147    PT SHORT TERM GOAL #1   Title Independent with inital HEP   Status Achieved   PT SHORT TERM GOAL #2   Title Improve LT quads stength to 4/5 to improve gait stability   Status On-going   PT SHORT TERM GOAL #3   Title Lift Lt leg onto the bed without UE assist   Status On-going  PT SHORT TERM GOAL #4   Title Reports pain improved to 5/10   Status Achieved          PT Long Term Goals - 03/24/14 1148    PT LONG TERM GOAL #1   Title Independent with Advanced HEP   Status On-going   PT LONG TERM GOAL #2   Title Pt will report pain improved to 2/10 geneerally and 3-4/10 with walking   Status On-going   PT LONG TERM GOAL #3   Title improve LT hip strength to 4-/5 or better to improve gait and independne t with least restricted device.    Status On-going   PT LONG TERM GOAL #4   Title Return to light home tasks safely   Status On-going          Plan - 03/24/14 1146    Clinical Impression Statement She is progressing toward goal  but remains very weak in LT lower extr.    Pt will benefit from skilled therapeutic intervention in order to improve on the following deficits Difficulty walking;Decreased strength;Decreased mobility;Decreased endurance;Decreased activity  tolerance;Decreased balance;Pain   Rehab Potential Good   PT Frequency 3x / week   PT Duration 4 weeks   PT Treatment/Interventions ADLs/Self Care Home Management;Patient/family education;Therapeutic exercise;Gait training;Balance training;Electrical Stimulation;Functional mobility training;Moist Heat   PT Next Visit Plan Continue Nustep, LE strength   PT Home Exercise Plan Add to HEp as able   Consulted and Agree with Plan of Care Patient                               Problem List Patient Active Problem List   Diagnosis Date Noted  . Scoliosis of lumbar spine 01/05/2014    Caprice RedChasse, Stephen M PT 03/24/2014, 11:49 AM

## 2014-03-24 NOTE — Patient Instructions (Signed)
I asked her to increase time walking in store to 15 min or more.

## 2014-03-29 ENCOUNTER — Ambulatory Visit: Payer: Federal, State, Local not specified - PPO

## 2014-03-29 DIAGNOSIS — M25552 Pain in left hip: Secondary | ICD-10-CM

## 2014-03-29 DIAGNOSIS — R269 Unspecified abnormalities of gait and mobility: Secondary | ICD-10-CM

## 2014-03-29 DIAGNOSIS — R29898 Other symptoms and signs involving the musculoskeletal system: Secondary | ICD-10-CM

## 2014-03-29 DIAGNOSIS — Z981 Arthrodesis status: Secondary | ICD-10-CM

## 2014-03-29 NOTE — Therapy (Signed)
[facility] [address] [place], [place], [address] Phone: [phone]   Fax:  [phone]  Physical Therapy Treatment  Patient Details  Name: Natasha Frederick MRN: [id number] Date of Birth: 11/22/1955  Encounter Date: 03/29/2014      PT End of Session - 03/29/14 1452    Visit Number 8   Number of Visits 24   Date for PT Re-Evaluation 04/07/14   PT Start Time 1400   PT Stop Time 1450   PT Time Calculation (min) 50 min   Activity Tolerance Patient tolerated treatment well   Behavior During Therapy [facility] for tasks assessed/performed      Past Medical History  Diagnosis Date  . Endometriosis     Past Surgical History  Procedure Laterality Date  . Breast surgery  1987    bx  . Tonsillectomy  1958  . Oophorectomy  1977    left  . Appendectomy  1963  . Tubal ligation  1986  . Colonoscopy w/ polypectomy    . Planters wart Left 2005  . Anterior lat lumbar fusion Left 01/05/2014    Procedure: LUMBAR TWO TO THREE, LUMBAR LUMBAR THREE TO FOUR, ANTERIOR LATERAL LUMBAR FUSION 2 LEVELS;  Surgeon: Reinaldo Meekerandy O Kritzer, MD;  Location: [facility];  Service: Neurosurgery;  Laterality: Left;    There were no vitals taken for this visit.  Visit Diagnosis:  Pain in joint, pelvic region and thigh, left  S/P lumbar fusion  Abnormality of gait  Weakness of left hip      Subjective Assessment - 03/29/14 1357    Symptoms Feeling ok with very low pain with meds    Currently in Pain? Yes   Pain Score 1    Pain Location Back  /hip   Pain Orientation Left   Pain Descriptors / Indicators Aching   Pain Type Surgical pain   Pain Radiating Towards Lt hip   Pain Onset More than a month ago   Aggravating Factors  lifting leg   Pain Relieving Factors moving   Effect of Pain on Daily Activities Limited with walker , weakness and pain   Multiple Pain Sites No                    [facility] Adult PT Treatment/Exercise - 03/29/14 1402     Exercises   Exercises --  Nustep L5 LE only 5 minutes   Knee/Hip Exercises: Standing   Heel Raises 15 reps   Other Standing Knee Exercises Marching x15 RT andLT   Other Standing Knee Exercises Stand to wall and bila arm reach slide x10 the in step stand x10 with RT and LT arm x10 RT foot fore and the LT.    Knee/Hip Exercises: Seated   Long Arc Quad Weight 5 lbs.   Long [employer] Quad Limitations , 10 sec hold, 20 reps   Knee/Hip Exercises: Supine   Hip Adduction Isometric Both   Hip Adduction Isometric Limitations 2 sets 15 reps with bridge   Other Supine Knee Exercises assisted LT hip abduction x20   Other Supine Knee Exercises Assited LT hip flexion with min assist and she had poor controll at end range going into extension. used verbal cues to faclitate with focus on lifting LT knee in air  Also assisted isometric holds in range of LE flexion/ exten   Knee/Hip Exercises: Sidelying   Clams x15 witrh ball press with feet x15    Other Sidelying Knee Exercises assisted LT hip  int rot                PT Education - 03/29/14 1452    Education provided Yes   Education Details Discussed fall risk and need for suport with walker   Person(s) Educated Patient   Methods Explanation;Verbal cues   Comprehension Verbalized understanding          PT Short Term Goals - 03/24/14 1147    PT SHORT TERM GOAL #1   Title Independent with inital HEP   Status Achieved   PT SHORT TERM GOAL #2   Title Improve LT quads stength to 4/5 to improve gait stability   Status On-going   PT SHORT TERM GOAL #3   Title Lift Lt leg onto the bed without UE assist   Status On-going   PT SHORT TERM GOAL #4   Title Reports pain improved to 5/10   Status Achieved           PT Long Term Goals - 03/24/14 1148    PT LONG TERM GOAL #1   Title Independent with Advanced HEP   Status On-going   PT LONG TERM GOAL #2   Title Pt will report pain improved to 2/10 geneerally and 3-4/10 with walking    Status On-going   PT LONG TERM GOAL #3   Title improve LT hip strength to 4-/5 or better to improve gait and independne t with least restricted device.    Status On-going   PT LONG TERM GOAL #4   Title Return to light home tasks safely   Status On-going               Plan - 03/29/14 1452    Clinical Impression Statement She appears to be progressing with improved mobility independent at home and walking for incrased time (20 min) at home    PT Frequency 3x / week   PT Duration 3 weeks   PT Treatment/Interventions ADLs/Self Care Home Management;Patient/family education;Therapeutic exercise;Gait training;Balance training;Electrical Stimulation;Functional mobility training;Moist Heat   PT Next Visit Plan Continue Nustep, LE strength   PT Home Exercise Plan Add to HEp as able   Consulted and Agree with Plan of Care Patient        Problem List Patient Active Problem List   Diagnosis Date Noted  . Scoliosis of lumbar spine 01/05/2014    Caprice RedChasse, Stephen M PT 03/29/2014, 2:54 PM  [facility] [address] [place], [place], [address] Phone: [phone]   Fax:  [phone]

## 2014-03-29 NOTE — Patient Instructions (Signed)
Discussed progress and improved mobility at home. Aske dpt to not walk without walker and not with SPC at home for risk of fall.

## 2014-03-30 ENCOUNTER — Ambulatory Visit: Payer: Federal, State, Local not specified - PPO

## 2014-03-30 ENCOUNTER — Telehealth: Payer: Self-pay | Admitting: *Deleted

## 2014-03-30 DIAGNOSIS — R29898 Other symptoms and signs involving the musculoskeletal system: Secondary | ICD-10-CM

## 2014-03-30 DIAGNOSIS — M25552 Pain in left hip: Secondary | ICD-10-CM

## 2014-03-30 DIAGNOSIS — R269 Unspecified abnormalities of gait and mobility: Secondary | ICD-10-CM

## 2014-03-30 DIAGNOSIS — Z981 Arthrodesis status: Secondary | ICD-10-CM

## 2014-03-30 NOTE — Telephone Encounter (Signed)
appts made and printed...td 

## 2014-03-30 NOTE — Therapy (Signed)
[facility] [address] [place], [place], [address] Phone: [phone]   Fax:  [phone]  Physical Therapy Treatment  Patient Details  Name: Natasha BeckmannJudith Binstock MRN: [id number] Date of Birth: 08/12/1955  Encounter Date: 03/30/2014      PT End of Session - 03/30/14 0925    Visit Number 9   Number of Visits 24   Date for PT Re-Evaluation 04/07/14   PT Start Time 0842   PT Stop Time 0935   PT Time Calculation (min) 53 min   Activity Tolerance Patient tolerated treatment well   Behavior During Therapy [facility] for tasks assessed/performed      Past Medical History  Diagnosis Date  . Endometriosis     Past Surgical History  Procedure Laterality Date  . Breast surgery  1987    bx  . Tonsillectomy  1958  . Oophorectomy  1977    left  . Appendectomy  1963  . Tubal ligation  1986  . Colonoscopy w/ polypectomy    . Planters wart Left 2005  . Anterior lat lumbar fusion Left 01/05/2014    Procedure: LUMBAR TWO TO THREE, LUMBAR LUMBAR THREE TO FOUR, ANTERIOR LATERAL LUMBAR FUSION 2 LEVELS;  Surgeon: Reinaldo Meekerandy O Kritzer, MD;  Location: [facility];  Service: Neurosurgery;  Laterality: Left;    There were no vitals taken for this visit.  Visit Diagnosis:  Pain in joint, pelvic region and thigh, left  S/P lumbar fusion  Abnormality of gait  Weakness of left hip      Subjective Assessment - 03/30/14 0844    Symptoms Doing fine no changes but did lift knee in bed without UE asssit last night.    Currently in Pain? Yes   Pain Score 2    Pain Location Back   Pain Orientation Left   Pain Descriptors / Indicators Aching   Pain Type Surgical pain   Pain Radiating Towards Lt hip   Pain Onset More than a month ago   Pain Frequency Constant   Aggravating Factors  Activity, lifting leg , sleep position   Pain Relieving Factors Gentle activity   Multiple Pain Sites No                    [facility] Adult PT Treatment/Exercise -  03/30/14 0846    Knee/Hip Exercises: Aerobic   Stationary Bike Nustep level 4 5 min LE and UE  then 5 min legs only  Second set of   10 min L3 LE only   Knee/Hip Exercises: Standing   Heel Raises 20 reps;2 seconds   Wall Squat 2 sets;10 reps  5 sec hold sec set no UE suppport. difficulty follow directi   Other Standing Knee Exercises Marching x15 RT andLT   Other Standing Knee Exercises Sit to stand x10 seat 24 inches,  22 inches x5, 20 inces x 5   Knee/Hip Exercises: Seated   Long Arc Quad Right;Left;1 set;15 reps;Weights   Long Arc Quad Weight 6 lbs.   Long [employer] Quad Limitations , 10 sec hold, 20 reps   Knee/Hip Exercises: Supine   Hip Adduction Isometric Both;1 set;15 reps   Bridges AROM;Both;1 set;15 reps   with ball squeeze   Other Supine Knee Exercises assisted LT hip abduction x20                PT Education - 03/29/14 1452    Education provided Yes   Education Details Discussed fall risk and need for  suport with walker   Person(s) Educated Patient   Methods Explanation;Verbal cues   Comprehension Verbalized understanding          PT Short Term Goals - 03/24/14 1147    PT SHORT TERM GOAL #1   Title Independent with inital HEP   Status Achieved   PT SHORT TERM GOAL #2   Title Improve LT quads stength to 4/5 to improve gait stability   Status On-going   PT SHORT TERM GOAL #3   Title Lift Lt leg onto the bed without UE assist   Status On-going   PT SHORT TERM GOAL #4   Title Reports pain improved to 5/10   Status Achieved           PT Long Term Goals - 03/24/14 1148    PT LONG TERM GOAL #1   Title Independent with Advanced HEP   Status On-going   PT LONG TERM GOAL #2   Title Pt will report pain improved to 2/10 geneerally and 3-4/10 with walking   Status On-going   PT LONG TERM GOAL #3   Title improve LT hip strength to 4-/5 or better to improve gait and independne t with least restricted device.    Status On-going   PT LONG TERM GOAL #4    Title Return to light home tasks safely   Status On-going               Plan - 03/30/14 0926    Clinical Impression Statement She appearas to be progressing with LT LE strength but still quite weak   Pt will benefit from skilled therapeutic intervention in order to improve on the following deficits Difficulty walking;Decreased strength;Decreased mobility;Decreased endurance;Decreased activity tolerance;Decreased balance;Pain   Rehab Potential Good   PT Frequency 3x / week   PT Duration 2 weeks   PT Next Visit Plan Continue Nustep, LE strength, MMT, FOTO   PT Home Exercise Plan Add to HEp as able   Consulted and Agree with Plan of Care Patient        Problem List Patient Active Problem List   Diagnosis Date Noted  . Scoliosis of lumbar spine 01/05/2014    Caprice RedChasse, Stephen M PT 03/30/2014, 9:54 AM  [facility] [address] [place], [place], [address] Phone: [phone]   Fax:  [phone]

## 2014-03-31 ENCOUNTER — Ambulatory Visit: Payer: Federal, State, Local not specified - PPO

## 2014-03-31 DIAGNOSIS — M25552 Pain in left hip: Secondary | ICD-10-CM

## 2014-03-31 DIAGNOSIS — R269 Unspecified abnormalities of gait and mobility: Secondary | ICD-10-CM

## 2014-03-31 DIAGNOSIS — Z981 Arthrodesis status: Secondary | ICD-10-CM

## 2014-03-31 DIAGNOSIS — R29898 Other symptoms and signs involving the musculoskeletal system: Secondary | ICD-10-CM

## 2014-03-31 NOTE — Therapy (Signed)
[facility] [address] [place], [place], [address] Phone: [phone]   Fax:  [phone]  Physical Therapy Treatment  Patient Details  Name: Natasha BeckmannJudith Dorsi MRN: [id number] Date of Birth: 11/27/1955  Encounter Date: 03/31/2014      PT End of Session - 03/31/14 1139    Visit Number 10   Number of Visits 24   Date for PT Re-Evaluation 04/07/14   PT Start Time 1050   PT Stop Time 1150   PT Time Calculation (min) 60 min   Activity Tolerance Patient tolerated treatment well   Behavior During Therapy [facility] for tasks assessed/performed      Past Medical History  Diagnosis Date  . Endometriosis     Past Surgical History  Procedure Laterality Date  . Breast surgery  1987    bx  . Tonsillectomy  1958  . Oophorectomy  1977    left  . Appendectomy  1963  . Tubal ligation  1986  . Colonoscopy w/ polypectomy    . Planters wart Left 2005  . Anterior lat lumbar fusion Left 01/05/2014    Procedure: LUMBAR TWO TO THREE, LUMBAR LUMBAR THREE TO FOUR, ANTERIOR LATERAL LUMBAR FUSION 2 LEVELS;  Surgeon: Reinaldo Meekerandy O Kritzer, MD;  Location: [facility];  Service: Neurosurgery;  Laterality: Left;    There were no vitals taken for this visit.  Visit Diagnosis:  Pain in joint, pelvic region and thigh, left  S/P lumbar fusion  Abnormality of gait  Weakness of left hip  Weakness of left leg      Subjective Assessment - 03/31/14 1051    Symptoms No changes from 12/22   Currently in Pain? Yes          [facility] PT Assessment - 03/31/14 1153    Observation/Other Assessments   Focus on Therapeutic Outcomes (FOTO)  ODI score                   [facility] Adult PT Treatment/Exercise - 03/31/14 1056    Knee/Hip Exercises: Aerobic   Stationary Bike Nustep L5  UE and LE 7  min  second set of L4 LE 10 minutes   Knee/Hip Exercises: Standing   Wall Squat 10 reps   Wall Squat Limitations She is unable to bear much weight to Lt leg . leg  is shorter than RT and  weak   Other Standing Knee Exercises Marching x15 RT andLT  assisted with end range and did holds 3-5 sec   Knee/Hip Exercises: Seated   Long Arc Quad Left;Right;1 set;15 reps   Knee/Hip Exercises: Supine   Short Arc Quad Sets Left;AROM;1 set;15 reps  press knee into purple ball   Hip Adduction Isometric Both;1 set;15 reps   Bridges AROM;Both;1 set;15 reps  with adduction into ball   Straight Leg Raises --  isometric hold x10 reps 3 sec hold with asssit for placement   Other Supine Knee Exercises assisted LT hip abduction x20   Other Supine Knee Exercises Clams bilaterla with red band x15   Knee/Hip Exercises: Sidelying   Clams x15   Other Sidelying Knee Exercises press LT foot into purple ball                  PT Short Term Goals - 03/24/14 1147    PT SHORT TERM GOAL #1   Title Independent with inital HEP   Status Achieved   PT SHORT TERM GOAL #2   Title Improve LT quads stength  to 4/5 to improve gait stability   Status On-going   PT SHORT TERM GOAL #3   Title Lift Lt leg onto the bed without UE assist   Status On-going   PT SHORT TERM GOAL #4   Title Reports pain improved to 5/10   Status Achieved           PT Long Term Goals - 03/24/14 1148    PT LONG TERM GOAL #1   Title Independent with Advanced HEP   Status On-going   PT LONG TERM GOAL #2   Title Pt will report pain improved to 2/10 geneerally and 3-4/10 with walking   Status On-going   PT LONG TERM GOAL #3   Title improve LT hip strength to 4-/5 or better to improve gait and independne t with least restricted device.    Status On-going   PT LONG TERM GOAL #4   Title Return to light home tasks safely   Status On-going               Plan - 03/31/14 1140    Clinical Impression Statement no changes from 11/22   Pt will benefit from skilled therapeutic intervention in order to improve on the following deficits Difficulty walking;Decreased strength;Decreased  mobility;Decreased endurance;Decreased activity tolerance;Decreased balance;Pain   Rehab Potential Good   PT Frequency 3x / week   PT Duration 2 weeks   PT Treatment/Interventions ADLs/Self Care Home Management;Patient/family education;Therapeutic exercise;Gait training;Balance training;Electrical Stimulation;Functional mobility training;Moist Heat   PT Next Visit Plan Continue Nustep, LE strength, MMT, FOTO   PT Home Exercise Plan Add to HEP as able   Consulted and Agree with Plan of Care Patient        Problem List Patient Active Problem List   Diagnosis Date Noted  . Scoliosis of lumbar spine 01/05/2014    Caprice RedChasse, Stephen M PT 03/31/2014, 12:01 PM  [facility] [address] [place], [place], [address] Phone: [phone]   Fax:  [phone]

## 2014-04-05 ENCOUNTER — Ambulatory Visit: Payer: Federal, State, Local not specified - PPO

## 2014-04-05 DIAGNOSIS — R269 Unspecified abnormalities of gait and mobility: Secondary | ICD-10-CM

## 2014-04-05 DIAGNOSIS — M25552 Pain in left hip: Secondary | ICD-10-CM

## 2014-04-05 DIAGNOSIS — R29898 Other symptoms and signs involving the musculoskeletal system: Secondary | ICD-10-CM

## 2014-04-05 DIAGNOSIS — Z981 Arthrodesis status: Secondary | ICD-10-CM

## 2014-04-05 NOTE — Therapy (Signed)
[facility] [address] [place], [place], [address] Phone: [phone]   Fax:  [phone]  Physical Therapy Treatment  Patient Details  Name: Natasha BeckmannJudith Frederick MRN: [id number] Date of Birth: 03/19/1956  Encounter Date: 04/05/2014      PT End of Session - 04/05/14 1648    Visit Number 11   Number of Visits 24   Date for PT Re-Evaluation 04/07/14   PT Start Time 1545   PT Stop Time 1645   PT Time Calculation (min) 60 min   Activity Tolerance Patient tolerated treatment well   Behavior During Therapy [facility] for tasks assessed/performed      Past Medical History  Diagnosis Date  . Endometriosis     Past Surgical History  Procedure Laterality Date  . Breast surgery  1987    bx  . Tonsillectomy  1958  . Oophorectomy  1977    left  . Appendectomy  1963  . Tubal ligation  1986  . Colonoscopy w/ polypectomy    . Planters wart Left 2005  . Anterior lat lumbar fusion Left 01/05/2014    Procedure: LUMBAR TWO TO THREE, LUMBAR LUMBAR THREE TO FOUR, ANTERIOR LATERAL LUMBAR FUSION 2 LEVELS;  Surgeon: Reinaldo Meekerandy O Kritzer, MD;  Location: [facility];  Service: Neurosurgery;  Laterality: Left;    There were no vitals taken for this visit.  Visit Diagnosis:  Pain in joint, pelvic region and thigh, left  S/P lumbar fusion  Abnormality of gait  Weakness of left hip  Weakness of left leg      Subjective Assessment - 04/05/14 1539    Symptoms No changes. No pain.    How long can you sit comfortably? 45 min   How long can you stand comfortably? 45 min   How long can you walk comfortably? 25 min   Patient Stated Goals Walk without walker and decr sciatic.    Currently in Pain? No/denies   Multiple Pain Sites No          [facility] PT Assessment - 04/05/14 1606    Observation/Other Assessments   Focus on Therapeutic Outcomes (FOTO)  ODI scorre 46% (12 23/15)                  [facility] Adult PT Treatment/Exercise - 04/05/14  1558    Lumbar Exercises: Supine   Other Supine Lumbar Exercises Hip flexion with moving knee into 90 degrees postion  2 sets of 12   Other Supine Lumbar Exercises assited (minimally with verbal ues) lifting  RT leg on/ off bed in supinr 2x10   Knee/Hip Exercises: Aerobic   Stationary Bike Recum bike L2 5 minutes   Knee/Hip Exercises: Seated   Long Arc Quad Strengthening;Left;Right;1 set   30 reps   Heel Slides AROM;Left;1 set  12 reps   Knee/Hip Exercises: Supine   Bridges AROM;Both;1 set;15 reps  Knees over bolster   Knee Flexion AROM;Left;1 set;15 reps  LE flexion . She had to hesitate /stop mid way in range   Other Supine Knee Exercises assisted LT hip abduction x20   Other Supine Knee Exercises Clams bilateral with red band x15                PT Education - 04/05/14 1647    Education provided Yes   Education Details Assited LE stretght with spouse. Ansered questions about recovery and prognosis as able    Person(s) Educated Patient;Spouse   Methods Explanation;Demonstration;Verbal cues;Tactile cues;Handout   Comprehension  Verbalized understanding;Returned demonstration          PT Short Term Goals - 03/24/14 1147    PT SHORT TERM GOAL #1   Title Independent with inital HEP   Status Achieved   PT SHORT TERM GOAL #2   Title Improve LT quads stength to 4/5 to improve gait stability   Status On-going   PT SHORT TERM GOAL #3   Title Lift Lt leg onto the bed without UE assist   Status On-going   PT SHORT TERM GOAL #4   Title Reports pain improved to 5/10   Status Achieved           PT Long Term Goals - 03/24/14 1148    PT LONG TERM GOAL #1   Title Independent with Advanced HEP   Status On-going   PT LONG TERM GOAL #2   Title Pt will report pain improved to 2/10 geneerally and 3-4/10 with walking   Status On-going   PT LONG TERM GOAL #3   Title improve LT hip strength to 4-/5 or better to improve gait and independne t with least restricted device.     Status On-going   PT LONG TERM GOAL #4   Title Return to light home tasks safely   Status On-going               Plan - 04/05/14 1649    Clinical Impression Statement She appears to be improved with lifting and moveint LT leg with less asssit   Pt will benefit from skilled therapeutic intervention in order to improve on the following deficits Difficulty walking;Decreased strength;Decreased mobility;Decreased endurance;Decreased activity tolerance;Decreased balance;Pain   Rehab Potential Good   PT Next Visit Plan Continue Nustep, LE strength,    PT Home Exercise Plan Add to HEP as able   Consulted and Agree with Plan of Care Patient;Family member/caregiver        Problem List Patient Active Problem List   Diagnosis Date Noted  . Scoliosis of lumbar spine 01/05/2014    Caprice RedChasse, Stephen M PT 04/05/2014, 4:51 PM  [facility] [address] [place], [place], [address] Phone: [phone]   Fax:  [phone]

## 2014-04-06 ENCOUNTER — Ambulatory Visit: Payer: Federal, State, Local not specified - PPO

## 2014-04-06 DIAGNOSIS — R29898 Other symptoms and signs involving the musculoskeletal system: Secondary | ICD-10-CM

## 2014-04-06 DIAGNOSIS — R269 Unspecified abnormalities of gait and mobility: Secondary | ICD-10-CM

## 2014-04-06 DIAGNOSIS — M25552 Pain in left hip: Secondary | ICD-10-CM | POA: Diagnosis not present

## 2014-04-06 NOTE — Patient Instructions (Signed)
We discussed lying on her side and we used pillows between knees for pelvic alignment and she then informed me Dr Gerlene FeeKritzer wanted her to sleep on back for now.

## 2014-04-06 NOTE — Therapy (Signed)
[facility] [address] [place], [place], [address] Phone: [phone]   Fax:  [phone]  Physical Therapy Treatment  Patient Details  Name: Natasha BeckmannJudith Frederick MRN: [id number] Date of Birth: 06/27/1955  Encounter Date: 04/06/2014      PT End of Session - 04/06/14 1101    Visit Number 12   Date for PT Re-Evaluation 04/07/14   PT Start Time 1015   PT Stop Time 1100   PT Time Calculation (min) 45 min   Activity Tolerance Patient tolerated treatment well   Behavior During Therapy [facility] for tasks assessed/performed      Past Medical History  Diagnosis Date  . Endometriosis     Past Surgical History  Procedure Laterality Date  . Breast surgery  1987    bx  . Tonsillectomy  1958  . Oophorectomy  1977    left  . Appendectomy  1963  . Tubal ligation  1986  . Colonoscopy w/ polypectomy    . Planters wart Left 2005  . Anterior lat lumbar fusion Left 01/05/2014    Procedure: LUMBAR TWO TO THREE, LUMBAR LUMBAR THREE TO FOUR, ANTERIOR LATERAL LUMBAR FUSION 2 LEVELS;  Surgeon: Reinaldo Meekerandy O Kritzer, MD;  Location: [facility];  Service: Neurosurgery;  Laterality: Left;    There were no vitals taken for this visit.  Visit Diagnosis:  Abnormality of gait  Weakness of left hip  Weakness of left leg      Subjective Assessment - 04/06/14 1019    Symptoms No pain . No changes   Currently in Pain? No/denies   Multiple Pain Sites No          [facility] PT Assessment - 04/05/14 1606    Observation/Other Assessments   Focus on Therapeutic Outcomes (FOTO)  ODI scorre 46% (12 23/15)                  [facility] Adult PT Treatment/Exercise - 04/06/14 1031    Knee/Hip Exercises: Aerobic   Stationary Bike Recum bike L2 5 minutes   Knee/Hip Exercises: Seated   Long Arc Quad Strengthening;Left;Right;1 set   Con-wayLong Arc Quad Weight 5 lbs.   Knee/Hip Exercises: Supine   Heel Slides AROM;Left;2 sets;10 reps  with Red Tband around knees to  incr extension effort.    Hip Adduction Isometric Strengthening;Both;1 set;15 reps  5 sec hold, into ball   Bridges AROM;Both;1 set;15 reps   Other Supine Knee Exercises assisted LT hip abduction x20   Other Supine Knee Exercises Clams bilateral with red band x15   Knee/Hip Exercises: Sidelying   Clams x15, cued to no roll hip back and keep foot on ball    Other Sidelying Knee Exercises press LT foot into purple ball x15 with cues to not moe foot back(flex knee)   Shoulder Exercises: Seated   Flexion Both;12 reps;Strengthening   Flexion Weight (lbs) 3                PT Education - 04/05/14 1647    Education provided Yes   Education Details Assited LE stretght with spouse. Ansered questions about recovery and prognosis as able    Person(s) Educated Patient;Spouse   Methods Explanation;Demonstration;Verbal cues;Tactile cues;Handout   Comprehension Verbalized understanding;Returned demonstration          PT Short Term Goals - 03/24/14 1147    PT SHORT TERM GOAL #1   Title Independent with inital HEP   Status Achieved   PT SHORT TERM GOAL #2  Title Improve LT quads stength to 4/5 to improve gait stability   Status On-going   PT SHORT TERM GOAL #3   Title Lift Lt leg onto the bed without UE assist   Status On-going   PT SHORT TERM GOAL #4   Title Reports pain improved to 5/10   Status Achieved           PT Long Term Goals - 03/24/14 1148    PT LONG TERM GOAL #1   Title Independent with Advanced HEP   Status On-going   PT LONG TERM GOAL #2   Title Pt will report pain improved to 2/10 geneerally and 3-4/10 with walking   Status On-going   PT LONG TERM GOAL #3   Title improve LT hip strength to 4-/5 or better to improve gait and independne t with least restricted device.    Status On-going   PT LONG TERM GOAL #4   Title Return to light home tasks safely   Status On-going               Plan - 04/06/14 1101    Clinical Impression Statement No  changes from 04/05/14   Pt will benefit from skilled therapeutic intervention in order to improve on the following deficits Difficulty walking;Decreased strength;Decreased mobility;Decreased endurance;Decreased activity tolerance;Decreased balance;Pain   Rehab Potential Good   PT Treatment/Interventions ADLs/Self Care Home Management;Patient/family education;Therapeutic exercise;Gait training;Balance training;Electrical Stimulation;Functional mobility training;Moist Heat   Consulted and Agree with Plan of Care Patient        Problem List Patient Active Problem List   Diagnosis Date Noted  . Scoliosis of lumbar spine 01/05/2014    Caprice RedChasse, Stephen M PT 04/06/2014, 11:03 AM  [facility] [address] [place], [place], [address] Phone: [phone]   Fax:  [phone]

## 2014-04-08 ENCOUNTER — Ambulatory Visit: Payer: Federal, State, Local not specified - PPO

## 2014-04-08 DIAGNOSIS — M25552 Pain in left hip: Secondary | ICD-10-CM | POA: Diagnosis not present

## 2014-04-08 DIAGNOSIS — R29898 Other symptoms and signs involving the musculoskeletal system: Secondary | ICD-10-CM

## 2014-04-08 DIAGNOSIS — Z981 Arthrodesis status: Secondary | ICD-10-CM

## 2014-04-08 DIAGNOSIS — R269 Unspecified abnormalities of gait and mobility: Secondary | ICD-10-CM

## 2014-04-08 NOTE — Therapy (Signed)
[facility] [address] [place], [place], [address] Phone: [phone]   Fax:  [phone]  Physical Therapy Treatment  Patient Details  Name: Natasha BeckmannJudith Frederick MRN: [id number] Date of Birth: 05/24/1955  Encounter Date: 04/08/2014      PT End of Session - 04/08/14 1007    Visit Number 13   Number of Visits 24   Date for PT Re-Evaluation 05/24/14   PT Start Time 1008   PT Stop Time 1100   PT Time Calculation (min) 52 min   Activity Tolerance Patient tolerated treatment well   Behavior During Therapy [facility] for tasks assessed/performed      Past Medical History  Diagnosis Date  . Endometriosis     Past Surgical History  Procedure Laterality Date  . Breast surgery  1987    bx  . Tonsillectomy  1958  . Oophorectomy  1977    left  . Appendectomy  1963  . Tubal ligation  1986  . Colonoscopy w/ polypectomy    . Planters wart Left 2005  . Anterior lat lumbar fusion Left 01/05/2014    Procedure: LUMBAR TWO TO THREE, LUMBAR LUMBAR THREE TO FOUR, ANTERIOR LATERAL LUMBAR FUSION 2 LEVELS;  Surgeon: Reinaldo Meekerandy O Kritzer, MD;  Location: [facility];  Service: Neurosurgery;  Laterality: Left;    There were no vitals taken for this visit.  Visit Diagnosis:  Abnormality of gait  Weakness of left hip  Weakness of left leg  Pain in joint, pelvic region and thigh, left  S/P lumbar fusion        [facility] PT Assessment - 04/08/14 1038    Strength   Right Hip Flexion 5/5   Right Hip External Rotation  --  4+/5   Right Hip Internal Rotation  --  4+/5   Right Hip ABduction 3/5   Right Hip ADduction 5/5   Left Hip Flexion 4/5   Left Hip Extension 3+/5   Left Hip External Rotation  --  4-/5   Left Hip Internal Rotation  --  4-/5   Left Hip ABduction 2/5   Left Hip ADduction 4/5   Right Knee Flexion 5/5   Right Knee Extension 5/5   Left Knee Flexion 4/5   Left Knee Extension 4/5     I will retest before MD visit next month               [facility] Treatment/Exercise - 04/08/14 1012    Knee/Hip Exercises: Aerobic   Stationary Bike recmbent bike L2 6 minutes   Knee/Hip Exercises: Standing   Heel Raises 20 reps;2 seconds   Knee Flexion AROM;Right;Left;1 set;20 reps   Wall Squat 20 reps  mini squats in parallel bars   Other Standing Knee Exercises Attemoted to sit on bar in parallel bars  with knees bent and support weight equal on RT and LT leg.,.  She was unable to do this without  UE suppor and without she took weight off LT leg and extended her knee.. She had some knee pain with this                  PT Short Term Goals - 04/08/14 1051    PT SHORT TERM GOAL #1   Title Independent with inital HEP   Status Achieved   PT SHORT TERM GOAL #2   Title Improve LT quads stength to 4/5 to improve gait stability   Status Achieved   PT SHORT TERM GOAL #3  Title Lift Lt leg onto the bed without UE assist   Status On-going   PT SHORT TERM GOAL #4   Title Reports pain improved to 5/10   Status Achieved           PT Long Term Goals - 04/08/14 1052    PT LONG TERM GOAL #1   Title Independent with Advanced HEP   Status On-going   PT LONG TERM GOAL #2   Title Pt will report pain improved to 2/10 geneerally and 3-4/10 with walking  With medication   Status Achieved   PT LONG TERM GOAL #3   Title improve LT hip strength to 4-/5 or better to improve gait and independne t with least restricted device.    Status On-going   PT LONG TERM GOAL #4   Title Return to light home tasks safely  She is not doing any activity on feet due to lifting and movement restrictions   Status On-going               Plan - 04/08/14 1010    Clinical Impression Statement Slow progress but measurable improvemnets though weaknees and joint mobility /stability limit proress.  Crepitus noted at LT hip and knee She had some LT hip pain may be from hip rather than nerve.    Pt will benefit from skilled  therapeutic intervention in order to improve on the following deficits Difficulty walking;Decreased strength;Decreased mobility;Decreased endurance;Decreased activity tolerance;Decreased balance;Pain   Rehab Potential Good   PT Frequency 3x / week   PT Duration 3 weeks   PT Treatment/Interventions ADLs/Self Care Home Management;Patient/family education;Therapeutic exercise;Gait training;Balance training;Electrical Stimulation;Functional mobility training;Moist Heat   PT Next Visit Plan Continue Nustep, LE strength,    Consulted and Agree with Plan of Care Patient        Problem List Patient Active Problem List   Diagnosis Date Noted  . Scoliosis of lumbar spine 01/05/2014    Caprice RedChasse, Stephen M PT  04/08/2014, 11:02 AM  [facility] [address] [place], [place], [address] Phone: [phone]   Fax:  [phone]

## 2014-04-12 ENCOUNTER — Ambulatory Visit: Payer: Federal, State, Local not specified - PPO | Attending: Neurosurgery

## 2014-04-12 DIAGNOSIS — M419 Scoliosis, unspecified: Secondary | ICD-10-CM | POA: Insufficient documentation

## 2014-04-12 DIAGNOSIS — R269 Unspecified abnormalities of gait and mobility: Secondary | ICD-10-CM | POA: Insufficient documentation

## 2014-04-12 DIAGNOSIS — R531 Weakness: Secondary | ICD-10-CM | POA: Insufficient documentation

## 2014-04-12 DIAGNOSIS — Z5189 Encounter for other specified aftercare: Secondary | ICD-10-CM | POA: Insufficient documentation

## 2014-04-12 DIAGNOSIS — Z981 Arthrodesis status: Secondary | ICD-10-CM

## 2014-04-12 DIAGNOSIS — M25552 Pain in left hip: Secondary | ICD-10-CM | POA: Insufficient documentation

## 2014-04-12 DIAGNOSIS — R29898 Other symptoms and signs involving the musculoskeletal system: Secondary | ICD-10-CM

## 2014-04-12 DIAGNOSIS — M79652 Pain in left thigh: Secondary | ICD-10-CM | POA: Insufficient documentation

## 2014-04-12 NOTE — Therapy (Signed)
[facility] [address] [place], [place], [address] Phone: [phone]   Fax:  [phone]  Physical Therapy Treatment  Patient Details  Name: Natasha Frederick MRN: [id number] Date of Birth: 06-Apr-1956  Encounter Date: 04/12/2014      PT End of Session - 04/12/14 1109    Visit Number 14   Number of Visits 24   Date for PT Re-Evaluation 05/24/14   PT Start Time 1100   PT Stop Time 1145   PT Time Calculation (min) 45 min   Activity Tolerance Patient tolerated treatment well   Behavior During Therapy [facility] for tasks assessed/performed      Past Medical History  Diagnosis Date  . Endometriosis     Past Surgical History  Procedure Laterality Date  . Breast surgery  1987    bx  . Tonsillectomy  1958  . Oophorectomy  1977    left  . Appendectomy  1963  . Tubal ligation  1986  . Colonoscopy w/ polypectomy    . Planters wart Left 2005  . Anterior lat lumbar fusion Left 01/05/2014    Procedure: LUMBAR TWO TO THREE, LUMBAR LUMBAR THREE TO FOUR, ANTERIOR LATERAL LUMBAR FUSION 2 LEVELS;  Surgeon: Reinaldo Meeker, MD;  Location: [facility];  Service: Neurosurgery;  Laterality: Left;    There were no vitals taken for this visit.  Visit Diagnosis:  Abnormality of gait  Weakness of left hip  Weakness of left leg  Pain in joint, pelvic region and thigh, left  S/P lumbar fusion      Subjective Assessment - 04/12/14 1105    Symptoms No pain with pain meds. . Wants to know When can start bending and lifting. Lt hip pain much improved.. LT knee still gives pain at times   Currently in Pain? No/denies   Pain Type Surgical pain   Pain Onset More than a month ago   Pain Frequency Intermittent  Medication eliminates pain   Multiple Pain Sites No                    [facility] Adult PT Treatment/Exercise - 04/12/14 1108    Knee/Hip Exercises: Aerobic   Stationary Bike L2 7 min 2 sets   Knee/Hip Exercises: Standing   SLS  with Vectors RT and LT leg foot on wash cloth x10   Other Standing Knee Exercises Workied on LT LE extension in various postions of sitting to stand with hi/low table and use of sit fit and cues for pressure into LT heel sets of 8-10 reps 4 different positons   Knee/Hip Exercises: Seated   Other Seated Knee Exercises Sit to stand from 24 inches x10 , 22 inches x10                PT Education - 04/12/14 1159    Education provided Yes   Education Details Assisted hip flexion in supine   Person(s) Educated Spouse   Methods Explanation;Demonstration;Verbal cues   Comprehension Verbalized understanding          PT Short Term Goals - 04/08/14 1051    PT SHORT TERM GOAL #1   Title Independent with inital HEP   Status Achieved   PT SHORT TERM GOAL #2   Title Improve LT quads stength to 4/5 to improve gait stability   Status Achieved   PT SHORT TERM GOAL #3   Title Lift Lt leg onto the bed without UE assist   Status On-going  PT SHORT TERM GOAL #4   Title Reports pain improved to 5/10   Status Achieved           PT Long Term Goals - 04/08/14 1052    PT LONG TERM GOAL #1   Title Independent with Advanced HEP   Status On-going   PT LONG TERM GOAL #2   Title Pt will report pain improved to 2/10 geneerally and 3-4/10 with walking  With medication   Status Achieved   PT LONG TERM GOAL #3   Title improve LT hip strength to 4-/5 or better to improve gait and independne t with least restricted device.    Status On-going   PT LONG TERM GOAL #4   Title Return to light home tasks safely  She is not doing any activity on feet due to lifting and movement restrictions   Status On-going               Plan - 04/12/14 1200    Clinical Impression Statement No changes. Worked on LT LE weight acceptance and hip strength   Consulted and Agree with Plan of Care Patient        Problem List Patient Active Problem List   Diagnosis Date Noted  . Scoliosis of lumbar  spine 01/05/2014    Caprice Red PT 04/12/2014, 12:01 PM  [facility] [address] [place], [place], [address] Phone: [phone]   Fax:  [phone]

## 2014-04-12 NOTE — Patient Instructions (Signed)
Verbally reviewed HEP.

## 2014-04-14 ENCOUNTER — Ambulatory Visit: Payer: Federal, State, Local not specified - PPO

## 2014-04-14 DIAGNOSIS — Z981 Arthrodesis status: Secondary | ICD-10-CM

## 2014-04-14 DIAGNOSIS — M25552 Pain in left hip: Secondary | ICD-10-CM

## 2014-04-14 DIAGNOSIS — R29898 Other symptoms and signs involving the musculoskeletal system: Secondary | ICD-10-CM

## 2014-04-14 DIAGNOSIS — R269 Unspecified abnormalities of gait and mobility: Secondary | ICD-10-CM

## 2014-04-14 NOTE — Therapy (Signed)
[facility] [address] [place], [place], [address] Phone: [phone]   Fax:  [phone]  Physical Therapy Treatment  Patient Details  Name: Natasha Frederick MRN: [id number] Date of Birth: 06/15/1955  Encounter Date: 04/14/2014      PT End of Session - 04/14/14 1205    Visit Number 15   Number of Visits 24   Date for PT Re-Evaluation 05/24/14   PT Start Time 1100   PT Stop Time 1200   PT Time Calculation (min) 60 min   Activity Tolerance Patient tolerated treatment well   Behavior During Therapy [facility] for tasks assessed/performed      Past Medical History  Diagnosis Date  . Endometriosis     Past Surgical History  Procedure Laterality Date  . Breast surgery  1987    bx  . Tonsillectomy  1958  . Oophorectomy  1977    left  . Appendectomy  1963  . Tubal ligation  1986  . Colonoscopy w/ polypectomy    . Planters wart Left 2005  . Anterior lat lumbar fusion Left 01/05/2014    Procedure: LUMBAR TWO TO THREE, LUMBAR LUMBAR THREE TO FOUR, ANTERIOR LATERAL LUMBAR FUSION 2 LEVELS;  Surgeon: Reinaldo Meeker, MD;  Location: [facility];  Service: Neurosurgery;  Laterality: Left;    There were no vitals taken for this visit.  Visit Diagnosis:  Abnormality of gait  Weakness of left hip  Weakness of left leg  Pain in joint, pelvic region and thigh, left  S/P lumbar fusion      Subjective Assessment - 04/14/14 1112    Symptoms No pain                    [facility] Adult PT Treatment/Exercise - 04/14/14 1113    Knee/Hip Exercises: Aerobic   Stationary Bike L2 8 minutes   Knee/Hip Exercises: Supine   Short Arc Quad Sets Strengthening;Right;Left;1 set;20 reps  LT 7 pounds RT 8 pounds   Bridges AROM;Both;1 set;15 reps   Bridges Limitations Also di 2 sets 10 bridge with knees on bolster.    Straight Leg Raises Both;10 reps;2 sets   Knee/Hip Exercises: Sidelying   Hip ABduction AROM;Left;2 sets  slide leg on  bolster. Done supine   Clams x15  LT   Other Sidelying Knee Exercises Arm openings thoracic rotationx12, RT and LT                PT Education - 04/14/14 1156    Education provided Yes   Education Details Rotation exercise   Person(s) Educated Patient   Methods Explanation;Verbal cues;Handout   Comprehension Verbalized understanding;Returned demonstration          PT Short Term Goals - 04/08/14 1051    PT SHORT TERM GOAL #1   Title Independent with inital HEP   Status Achieved   PT SHORT TERM GOAL #2   Title Improve LT quads stength to 4/5 to improve gait stability   Status Achieved   PT SHORT TERM GOAL #3   Title Lift Lt leg onto the bed without UE assist   Status On-going   PT SHORT TERM GOAL #4   Title Reports pain improved to 5/10   Status Achieved           PT Long Term Goals - 04/08/14 1052    PT LONG TERM GOAL #1   Title Independent with Advanced HEP   Status On-going   PT LONG TERM  GOAL #2   Title Pt will report pain improved to 2/10 geneerally and 3-4/10 with walking  With medication   Status Achieved   PT LONG TERM GOAL #3   Title improve LT hip strength to 4-/5 or better to improve gait and independne t with least restricted device.    Status On-going   PT LONG TERM GOAL #4   Title Return to light home tasks safely  She is not doing any activity on feet due to lifting and movement restrictions   Status On-going               Plan - 04/14/14 1206    Clinical Impression Statement She is doing better but still weak nad loint stiffness in LT hip limits movement in LT leg.    Pt will benefit from skilled therapeutic intervention in order to improve on the following deficits Difficulty walking;Decreased strength;Decreased mobility;Decreased endurance;Decreased activity tolerance;Decreased balance;Pain   Rehab Potential Good   PT Frequency 3x / week   PT Duration 2 weeks   PT Treatment/Interventions ADLs/Self Care Home  Management;Therapeutic activities;Patient/family education;Passive range of motion;Therapeutic exercise;Gait training;Manual techniques;Balance training;Functional mobility training   PT Next Visit Plan Continue bike, LE strength, stanidng balance and weight acceptance LT leg   PT Home Exercise Plan Add to HEP as able   Consulted and Agree with Plan of Care Patient        Problem List Patient Active Problem List   Diagnosis Date Noted  . Scoliosis of lumbar spine 01/05/2014    Caprice RedChasse, Stephen M PT 04/14/2014, 12:19 PM  [facility] [address] [place], [place], [address] Phone: [phone]   Fax:  [phone]

## 2014-04-14 NOTE — Patient Instructions (Signed)
Lower Trunk Rotation / Pelvic Opener   Lean on full roller, spine rotated, knees together on one side. Open legs as far as possible. Rotate to other side. Hold each position ___ seconds. Repeat ___ times. Do ___ sessions per day.  Copyright  VHI. All rights reserved.   Also added with handout  Arm opening for thoracic rotation / lumbar rotation x15. She did them correctly and agreed for home 1xday 15 reps RT and Lt

## 2014-04-15 ENCOUNTER — Telehealth: Payer: Self-pay | Admitting: *Deleted

## 2014-04-15 NOTE — Telephone Encounter (Signed)
Pt husband would like for you to give him a call.....thanks

## 2014-04-16 ENCOUNTER — Ambulatory Visit: Payer: Federal, State, Local not specified - PPO

## 2014-04-16 DIAGNOSIS — M25552 Pain in left hip: Secondary | ICD-10-CM | POA: Diagnosis not present

## 2014-04-16 DIAGNOSIS — Z981 Arthrodesis status: Secondary | ICD-10-CM

## 2014-04-16 DIAGNOSIS — R269 Unspecified abnormalities of gait and mobility: Secondary | ICD-10-CM

## 2014-04-16 DIAGNOSIS — R29898 Other symptoms and signs involving the musculoskeletal system: Secondary | ICD-10-CM

## 2014-04-16 NOTE — Patient Instructions (Signed)
  Long discussion about progress , limitations and prognosis and safety factors of ability to walk off walker. HHer spouse was present for session and limits were demostrated to him for clarity wher she is at this point.

## 2014-04-16 NOTE — Therapy (Signed)
[facility] [address] [place], [place], [address] Phone: [phone]   Fax:  [phone]  Physical Therapy Treatment  Patient Details  Name: Natasha Frederick MRN: [id number] Date of Birth: Jul 13, 1955 Referring Provider:  Kristian Covey, MD  Encounter Date: 04/16/2014      PT End of Session - 04/16/14 1209    Visit Number 16   Number of Visits 24   Date for PT Re-Evaluation 05/24/14   PT Start Time 1100   PT Stop Time 1145   PT Time Calculation (min) 45 min   Activity Tolerance Patient tolerated treatment well   Behavior During Therapy [facility] for tasks assessed/performed      Past Medical History  Diagnosis Date  . Endometriosis     Past Surgical History  Procedure Laterality Date  . Breast surgery  1987    bx  . Tonsillectomy  1958  . Oophorectomy  1977    left  . Appendectomy  1963  . Tubal ligation  1986  . Colonoscopy w/ polypectomy    . Planters wart Left 2005  . Anterior lat lumbar fusion Left 01/05/2014    Procedure: LUMBAR TWO TO THREE, LUMBAR LUMBAR THREE TO FOUR, ANTERIOR LATERAL LUMBAR FUSION 2 LEVELS;  Surgeon: Reinaldo Meeker, MD;  Location: [facility];  Service: Neurosurgery;  Laterality: Left;    There were no vitals taken for this visit.  Visit Diagnosis:  Abnormality of gait  Weakness of left hip  Weakness of left leg  Pain in joint, pelvic region and thigh, left  S/P lumbar fusion      Subjective Assessment - 04/16/14 1108    Symptoms Feeling better each day   Currently in Pain? No/denies                    [facility] Adult PT Treatment/Exercise - 04/16/14 1202    Knee/Hip Exercises: Standing   Wall Squat 20 reps   Wall Squat Limitations  He rhusband was preset for demonstration on her limited weight bearring and that she is nmt ready for walkking off walker.    Knee/Hip Exercises: Seated   Other Seated Knee Exercises Sit to stand from 24 inches x10 , 22 inches x10   Other  Seated Knee Exercises LT LE WB through RT heel int sit fit 25 reps 5-10 rep with cues to not hyper extend LT knee.                 PT Education - 04/16/14 1208    Education provided Yes   Education Details level of progress   Person(s) Educated Patient;Spouse   Methods Explanation;Demonstration   Comprehension Verbalized understanding          PT Short Term Goals - 04/08/14 1051    PT SHORT TERM GOAL #1   Title Independent with inital HEP   Status Achieved   PT SHORT TERM GOAL #2   Title Improve LT quads stength to 4/5 to improve gait stability   Status Achieved   PT SHORT TERM GOAL #3   Title Lift Lt leg onto the bed without UE assist   Status On-going   PT SHORT TERM GOAL #4   Title Reports pain improved to 5/10   Status Achieved           PT Long Term Goals - 04/08/14 1052    PT LONG TERM GOAL #1   Title Independent with Advanced HEP   Status On-going  PT LONG TERM GOAL #2   Title Pt will report pain improved to 2/10 geneerally and 3-4/10 with walking  With medication   Status Achieved   PT LONG TERM GOAL #3   Title improve LT hip strength to 4-/5 or better to improve gait and independne t with least restricted device.    Status On-going   PT LONG TERM GOAL #4   Title Return to light home tasks safely  She is not doing any activity on feet due to lifting and movement restrictions   Status On-going               Plan - 04/16/14 1209    Clinical Impression Statement She is doing better but slowly. I had to clear up misperception of lack of progress and imminent discharge and tht we would continue until plateaus. They ,I think, are fearful of ultimate limits on mobility and independence.   Pt will benefit from skilled therapeutic intervention in order to improve on the following deficits Difficulty walking;Decreased strength;Decreased mobility;Decreased endurance;Decreased activity tolerance;Decreased balance;Pain   Rehab Potential Good   PT  Frequency 3x / week   PT Duration 4 weeks   PT Treatment/Interventions ADLs/Self Care Home Management;Therapeutic activities;Patient/family education;Passive range of motion;Therapeutic exercise;Gait training;Manual techniques;Balance training;Functional mobility training   PT Next Visit Plan Continue bike, LE strength, stanidng balance and weight acceptance LT leg   PT Home Exercise Plan Add to HEP as able   Consulted and Agree with Plan of Care Patient;Family member/caregiver        Problem List Patient Active Problem List   Diagnosis Date Noted  . Scoliosis of lumbar spine 01/05/2014    Caprice RedChasse, Stephen M PT 04/16/2014, 12:14 PM  [facility] [place], [place], [address] Phone: [phone]   Fax:  [phone]

## 2014-04-19 ENCOUNTER — Ambulatory Visit: Payer: Federal, State, Local not specified - PPO

## 2014-04-19 DIAGNOSIS — R269 Unspecified abnormalities of gait and mobility: Secondary | ICD-10-CM

## 2014-04-19 DIAGNOSIS — M25552 Pain in left hip: Secondary | ICD-10-CM | POA: Diagnosis not present

## 2014-04-19 DIAGNOSIS — R29898 Other symptoms and signs involving the musculoskeletal system: Secondary | ICD-10-CM

## 2014-04-19 DIAGNOSIS — Z981 Arthrodesis status: Secondary | ICD-10-CM

## 2014-04-19 NOTE — Therapy (Signed)
[facility] [address] [place], [place], [address] Phone: [phone]   Fax:  [phone]  Physical Therapy Treatment  Patient Details  Name: Natasha BeckmannJudith Leyva MRN: [id number] Date of Birth: 09/22/1955 Referring Provider:  Kristian CoveyBurchette, Bruce W, MD  Encounter Date: 04/19/2014      PT End of Session - 04/19/14 1054    Visit Number 17   Number of Visits 24   Date for PT Re-Evaluation 05/24/14   PT Start Time 1012   PT Stop Time 1058   PT Time Calculation (min) 46 min   Activity Tolerance Patient tolerated treatment well   Behavior During Therapy [facility] for tasks assessed/performed      Past Medical History  Diagnosis Date  . Endometriosis     Past Surgical History  Procedure Laterality Date  . Breast surgery  1987    bx  . Tonsillectomy  1958  . Oophorectomy  1977    left  . Appendectomy  1963  . Tubal ligation  1986  . Colonoscopy w/ polypectomy    . Planters wart Left 2005  . Anterior lat lumbar fusion Left 01/05/2014    Procedure: LUMBAR TWO TO THREE, LUMBAR LUMBAR THREE TO FOUR, ANTERIOR LATERAL LUMBAR FUSION 2 LEVELS;  Surgeon: Reinaldo Meekerandy O Kritzer, MD;  Location: [facility];  Service: Neurosurgery;  Laterality: Left;    There were no vitals taken for this visit.  Visit Diagnosis:  Abnormality of gait  Weakness of left hip  Weakness of left leg  Pain in joint, pelvic region and thigh, left  S/P lumbar fusion      Subjective Assessment - 04/19/14 1009    Symptoms Doing well. Did most of leg lifting in /out of bed and other activity without assist from hands   Currently in Pain? No/denies   Multiple Pain Sites No                    [facility] Adult PT Treatment/Exercise - 04/19/14 1012    Ambulation/Gait   Ambulation/Gait Yes   Ambulation/Gait Assistance 4: Min guard   Ambulation Distance (Feet) 75 Feet   Assistive device Straight cane   Gait Pattern Decreased step length - left;Decreased step length  - right;Decreased stance time - left;Decreased hip/knee flexion - left;Decreased weight shift to left;Left genu recurvatum;Trendelenburg   Lumbar Exercises: Supine   Other Supine Lumbar Exercises Lower trunk rotation with legs hip width apart x15 RT and LT   Knee/Hip Exercises: Aerobic   Stationary Bike L2 8 minutes  RPM's above 50 .    Knee/Hip Exercises: Standing   Lateral Step Up 1 set;10 reps;Hand Hold: 2;Step Height: 2";Step Height: 4"   Lateral Step Up Limitations She uses RT foot to push up with toes and asist descent. She locks LT knee and lifts with hip/trunk to lift   Knee/Hip Exercises: Seated   Long Arc Quad Strengthening;Left;Right;1 set   Con-wayLong Arc Quad Weight 6 lbs.   Knee/Hip Exercises: Supine   Bridges Strengthening;Both;1 set;20 reps   Bridges Limitations Also did 5 bridges with 3 adduct/abduct of hips                  PT Short Term Goals - 04/19/14 1018    PT SHORT TERM GOAL #3   Title Lift Lt leg onto the bed without UE assist   Status Achieved           PT Long Term Goals - 04/08/14 1052  PT LONG TERM GOAL #1   Title Independent with Advanced HEP   Status On-going   PT LONG TERM GOAL #2   Title Pt will report pain improved to 2/10 geneerally and 3-4/10 with walking  With medication   Status Achieved   PT LONG TERM GOAL #3   Title improve LT hip strength to 4-/5 or better to improve gait and independne t with least restricted device.    Status On-going   PT LONG TERM GOAL #4   Title Return to light home tasks safely  She is not doing any activity on feet due to lifting and movement restrictions   Status On-going               Plan - 04/19/14 1058    Clinical Impression Statement She ws unsteady with SPC  though  did better than expected but is not safe to walk without contact guard assist. LT hip still very weak and limits stability and active range.    Rehab Potential Good   PT Frequency 3x / week   PT Duration 3 weeks   PT  Treatment/Interventions ADLs/Self Care Home Management;Therapeutic activities;Patient/family education;Passive range of motion;Therapeutic exercise;Gait training;Manual techniques;Balance training;Functional mobility training   PT Next Visit Plan Continue bike, LE strength, stanidng balance and weight acceptance LT leg   PT Home Exercise Plan Add to HEP as able   Consulted and Agree with Plan of Care Patient        Problem List Patient Active Problem List   Diagnosis Date Noted  . Scoliosis of lumbar spine 01/05/2014    Caprice Red PT 04/19/2014, 11:01 AM  [facility] [address] [place], [place], [address] Phone: [phone]   Fax:  [phone]

## 2014-04-21 ENCOUNTER — Ambulatory Visit: Payer: Federal, State, Local not specified - PPO

## 2014-04-21 DIAGNOSIS — M25552 Pain in left hip: Secondary | ICD-10-CM | POA: Diagnosis not present

## 2014-04-21 DIAGNOSIS — R29898 Other symptoms and signs involving the musculoskeletal system: Secondary | ICD-10-CM

## 2014-04-21 DIAGNOSIS — Z981 Arthrodesis status: Secondary | ICD-10-CM

## 2014-04-21 DIAGNOSIS — R269 Unspecified abnormalities of gait and mobility: Secondary | ICD-10-CM

## 2014-04-21 NOTE — Therapy (Signed)
[facility] [address] [place], [place], [address] Phone: [phone]   Fax:  [phone]  Physical Therapy Treatment  Patient Details  Name: Natasha BeckmannJudith Purewal MRN: [id number] Date of Birth: 01/13/1956 Referring Provider:  Kristian CoveyBurchette, Bruce W, MD  Encounter Date: 04/21/2014      PT End of Session - 04/21/14 1153    Visit Number 18   Number of Visits 24   Date for PT Re-Evaluation 05/24/14   PT Start Time 1102   PT Stop Time 1145   PT Time Calculation (min) 43 min   Activity Tolerance Patient tolerated treatment well   Behavior During Therapy [facility] for tasks assessed/performed      Past Medical History  Diagnosis Date  . Endometriosis     Past Surgical History  Procedure Laterality Date  . Breast surgery  1987    bx  . Tonsillectomy  1958  . Oophorectomy  1977    left  . Appendectomy  1963  . Tubal ligation  1986  . Colonoscopy w/ polypectomy    . Planters wart Left 2005  . Anterior lat lumbar fusion Left 01/05/2014    Procedure: LUMBAR TWO TO THREE, LUMBAR LUMBAR THREE TO FOUR, ANTERIOR LATERAL LUMBAR FUSION 2 LEVELS;  Surgeon: Reinaldo Meekerandy O Kritzer, MD;  [facility];  Service: Neurosurgery;  Laterality: Left;    There were no vitals taken for this visit.  Visit Diagnosis:  Abnormality of gait  Weakness of left hip  Weakness of left leg  Pain in joint, pelvic region and thigh, left  S/P lumbar fusion      Subjective Assessment - 04/21/14 1106    Symptoms Feel good today   Currently in Pain? No/denies   Multiple Pain Sites No          [facility] PT Assessment - 04/21/14 1128    Observation/Other Assessments   Other Surveys  --  BERG score 38/56                  [facility] Adult PT Treatment/Exercise - 04/21/14 1107    Ambulation/Gait   Ambulation/Gait Assistance 4: Min guard   Ambulation Distance (Feet) 250 Feet  plus 75 feet   Assistive device Straight cane   Gait Pattern Decreased step  length - left;Decreased step length - right;Decreased stance time - left;Decreased hip/knee flexion - left;Decreased weight shift to left;Left genu recurvatum;Trendelenburg   Standardized Balance Assessment   Standardized Balance Assessment Berg Balance Test   Berg Balance Test   Sit to Stand Able to stand  independently using hands   Standing Unsupported Able to stand 2 minutes with supervision   Sitting with Back Unsupported but Feet Supported on Floor or Stool Able to sit safely and securely 2 minutes   Stand to Sit Controls descent by using hands   Transfers Able to transfer safely, definite need of hands   Standing Unsupported with Eyes Closed Able to stand 10 seconds with supervision   Standing Ubsupported with Feet Together Able to place feet together independently and stand for 1 minute with supervision   From Standing, Reach Forward with Outstretched Arm Can reach forward >12 cm safely (5")   From Standing Position, Pick up Object from Floor Able to pick up shoe, needs supervision   From Standing Position, Turn to Look Behind Over each Shoulder Turn sideways only but maintains balance   Turn 360 Degrees Needs close supervision or verbal cueing   Standing Unsupported, Alternately Place Feet on  Step/Stool Needs assistance to keep from falling or unable to try   Standing Unsupported, One Foot in Front Able to take small step independently and hold 30 seconds   Standing on One Leg Tries to lift leg/unable to hold 3 seconds but remains standing independently   Total Score 34   Knee/Hip Exercises: Aerobic   Stationary Bike L3 5 min RPM 40 or more   Knee/Hip Exercises: Standing   Side Lunges Right;Left;1 set;10 reps  with resistance of belt at hips with feet static   Other Standing Knee Exercises Worke on hip extension and hip hinge stangin with hips on parallel bar, Tactile cues for isolated hip extension                PT Education - 04/21/14 1147    Education provided Yes    Education Details BERG results   Person(s) Educated Patient   Methods Explanation   Comprehension Verbalized understanding          PT Short Term Goals - 04/19/14 1018    PT SHORT TERM GOAL #3   Title Lift Lt leg onto the bed without UE assist   Status Achieved           PT Long Term Goals - 04/08/14 1052    PT LONG TERM GOAL #1   Title Independent with Advanced HEP   Status On-going   PT LONG TERM GOAL #2   Title Pt will report pain improved to 2/10 geneerally and 3-4/10 with walking  With medication   Status Achieved   PT LONG TERM GOAL #3   Title improve LT hip strength to 4-/5 or better to improve gait and independne t with least restricted device.    Status On-going   PT LONG TERM GOAL #4   Title Return to light home tasks safely  She is not doing any activity on feet due to lifting and movement restrictions   Status On-going               Plan - 04/21/14 1154    Clinical Impression Statement Berg score indicates still need for walker for fall risk. Still unsable to bear weight to LT leg sufficiently to be safe wih cane.    Pt will benefit from skilled therapeutic intervention in order to improve on the following deficits Difficulty walking;Decreased strength;Decreased mobility;Decreased endurance;Decreased activity tolerance;Decreased balance;Pain   Rehab Potential Good   PT Frequency 3x / week   PT Duration 2 weeks   PT Treatment/Interventions ADLs/Self Care Home Management;Therapeutic activities;Patient/family education;Passive range of motion;Therapeutic exercise;Gait training;Manual techniques;Balance training;Functional mobility training   PT Next Visit Plan Continue bike, LE strength, stanidng balance and weight acceptance LT leg           MMT NEXT VISIT and note to MD   PT Home Exercise Plan Add to HEP as able   Consulted and Agree with Plan of Care Patient        Problem List Patient Active Problem List   Diagnosis Date Noted  . Scoliosis of  lumbar spine 01/05/2014    Caprice Red PT 04/21/2014, 11:57 AM  [facility] [address] [place], [place], [address] Phone: [phone]   Fax:  [phone]

## 2014-04-21 NOTE — Patient Instructions (Signed)
Discussed results of BERG score indicating need for walker for reducing risk of fall.

## 2014-04-23 ENCOUNTER — Ambulatory Visit: Payer: Federal, State, Local not specified - PPO

## 2014-04-23 DIAGNOSIS — M25552 Pain in left hip: Secondary | ICD-10-CM | POA: Diagnosis not present

## 2014-04-23 DIAGNOSIS — Z981 Arthrodesis status: Secondary | ICD-10-CM

## 2014-04-23 DIAGNOSIS — R29898 Other symptoms and signs involving the musculoskeletal system: Secondary | ICD-10-CM

## 2014-04-23 DIAGNOSIS — R269 Unspecified abnormalities of gait and mobility: Secondary | ICD-10-CM

## 2014-04-23 NOTE — Patient Instructions (Signed)
We discussed the MMT results and joints and possible impact on function but also the significant weakness of both hips

## 2014-04-23 NOTE — Therapy (Addendum)
[facility] [address] [place], [place], [address] Phone: [phone]   Fax:  [phone]  Physical Therapy Treatment  Patient Details  Name: Natasha Frederick MRN: [id number] Date of Birth: 11/29/1955 Referring Provider:  Reinaldo Meeker, MD  Encounter Date: 04/23/2014      Frederick End of Session - 04/23/14 1216    Visit Number 19   Number of Visits 24   Date for Frederick Re-Evaluation 05/24/14   Frederick Start Time 1100   Frederick Stop Time 1145   Frederick Time Calculation (min) 45 min   Activity Tolerance Patient tolerated treatment well   Behavior During Therapy [facility] for tasks assessed/performed      Past Medical History  Diagnosis Date  . Endometriosis     Past Surgical History  Procedure Laterality Date  . Breast surgery  1987    bx  . Tonsillectomy  1958  . Oophorectomy  1977    left  . Appendectomy  1963  . Tubal ligation  1986  . Colonoscopy w/ polypectomy    . Planters wart Left 2005  . Anterior lat lumbar fusion Left 01/05/2014    Procedure: LUMBAR TWO TO THREE, LUMBAR LUMBAR THREE TO FOUR, ANTERIOR LATERAL LUMBAR FUSION 2 LEVELS;  Surgeon: Natasha Meeker, MD;  Location: [facility];  Service: Neurosurgery;  Laterality: Left;    There were no vitals taken for this visit.  Visit Diagnosis:  Abnormality of gait  Pain in joint, pelvic region and thigh, left  S/P lumbar fusion  Weakness of left hip  Weakness of left leg      Subjective Assessment - 04/23/14 1100    Symptoms No new issues   Currently in Pain? No/denies   Pain Relieving Factors Gentle activity and muscle relaxors          [facility] Frederick Assessment - 04/23/14 1107    Strength   Overall Strength --  Ankles WNL but did not test PF   Right Hip Flexion 4/5   Right Hip Extension 3/5   Right Hip External Rotation  --  4+/5   Right Hip Internal Rotation  --  5-/5   Right Hip ABduction 3/5   Right Hip ADduction 5/5   Left Hip Flexion 3+/5  In supine she can  give 4/5 or more resistance    Left Hip Extension 2/5   Left Hip External Rotation  --  4-/5   Left Hip Internal Rotation  --  4-/5   Left Hip ABduction 3-/5   Left Hip ADduction 4/5   Right Knee Flexion 5/5   Right Knee Extension 5/5   Left Knee Flexion 5/5   Left Knee Extension 4+/5                  [facility] Adult Frederick Treatment/Exercise - 04/23/14 1106    Knee/Hip Exercises: Aerobic   Stationary Bike L3 5 min RPM 40 and above   Knee/Hip Exercises: Supine   Bridges Strengthening;Both;1 set;15 reps   Bridges Limitations also did 10 bridges with ball squeeze and 10 reps with  adduction into green band   Other Supine Knee Exercises Rolling and getting up from supine to sit. Verbal and tactile cues to do correctly and she did better after instruction   Knee/Hip Exercises: Sidelying   Hip ABduction AROM;1 set;15 reps  Also did abduction with extension with leg on support of 4 i   Clams x15 RT an LT  verbal and tactile cues to  keep trunk alignment                  Frederick Short Term Goals - 04/19/14 1018    Frederick SHORT TERM GOAL #3   Title Lift Lt leg onto the bed without UE assist   Status Achieved           Frederick Long Term Goals - 04/08/14 1052    Frederick LONG TERM GOAL #1   Title Independent with Advanced HEP   Status On-going   Frederick LONG TERM GOAL #2   Title Frederick will report pain improved to 2/10 geneerally and 3-4/10 with walking  With medication   Status Achieved   Frederick LONG TERM GOAL #3   Title improve LT hip strength to 4-/5 or better to improve gait and independne t with least restricted device.    Status On-going   Frederick LONG TERM GOAL #4   Title Return to light home tasks safely  She is not doing any activity on feet due to lifting and movement restrictions   Status On-going               Plan - 04/23/14 1209    Clinical Impression Statement She showed improved strenght in LT LE but stilllhas significant weakness affecting progress to walk off walker   with unilateral device.  The LT knee and hip appear to ahve significant OA and this may limit independence on feet   Frederick will benefit from skilled therapeutic intervention in order to improve on the following deficits Difficulty walking;Decreased strength;Decreased mobility;Decreased endurance;Decreased activity tolerance;Decreased balance;Pain   Rehab Potential Good   Clinical Impairments Affecting Rehab Potential Apparent OA LT knee and hip   Frederick Frequency 3x / week   Frederick Duration 4 weeks  then assess improvement and decide to cont another month 2-3x/week   Frederick Treatment/Interventions ADLs/Self Care Home Management;Therapeutic activities;Patient/family education;Passive range of motion;Therapeutic exercise;Gait training;Manual techniques;Balance training;Functional mobility training   Frederick Next Visit Plan Continue bike, LE strength, stanidng balance and weight acceptance LT leg           MMT NEXT VISIT and note to MD   Frederick Home Exercise Plan Add to HEP as able   Consulted and Agree with Plan of Care Patient        Problem List Patient Active Problem List   Diagnosis Date Noted  . Scoliosis of lumbar spine 01/05/2014    Natasha Frederick 04/23/2014, 12:21 PM  [facility] [address] [place], [place], [address] Phone: [phone]   Fax:  [phone]    PLEASE SEND A NOTE WITH [name] AS TO ANY PRECAUTIONS SHE STILL HAS REGAURDING HER LUMBAR SURGERY WITH BENDING /TWISTING ETC.   Natasha Frederick

## 2014-04-27 ENCOUNTER — Ambulatory Visit: Payer: Federal, State, Local not specified - PPO

## 2014-04-27 DIAGNOSIS — R269 Unspecified abnormalities of gait and mobility: Secondary | ICD-10-CM

## 2014-04-27 DIAGNOSIS — M25552 Pain in left hip: Secondary | ICD-10-CM

## 2014-04-27 DIAGNOSIS — R29898 Other symptoms and signs involving the musculoskeletal system: Secondary | ICD-10-CM

## 2014-04-27 DIAGNOSIS — Z981 Arthrodesis status: Secondary | ICD-10-CM

## 2014-04-27 NOTE — Therapy (Signed)
[facility] [address] [place], [place], [address] Phone: [phone]   Fax:  [phone]  Physical Therapy Treatment  Patient Details  Name: Zharia Conrow MRN: [id number] Date of Birth: [birth year] Referring Provider:  Kristian Covey, MD  Encounter Date: 04/27/2014      PT End of Session - 04/27/14 1240    Visit Number 20   Number of Visits 34   Date for PT Re-Evaluation 06/07/14   PT Start Time 1150   PT Stop Time 1235   PT Time Calculation (min) 45 min   Activity Tolerance Patient tolerated treatment well   Behavior During Therapy [facility] for tasks assessed/performed      Past Medical History  Diagnosis Date  . Endometriosis     Past Surgical History  Procedure Laterality Date  . Breast surgery  1987    bx  . Tonsillectomy  1958  . Oophorectomy  1977    left  . Appendectomy  1963  . Tubal ligation  1986  . Colonoscopy w/ polypectomy    . Planters wart Left 2005  . Anterior lat lumbar fusion Left 01/05/2014    Procedure: LUMBAR TWO TO THREE, LUMBAR LUMBAR THREE TO FOUR, ANTERIOR LATERAL LUMBAR FUSION 2 LEVELS;  Surgeon: Reinaldo Meeker, MD;  Location: [facility];  Service: Neurosurgery;  Laterality: Left;    There were no vitals taken for this visit.  Visit Diagnosis:  Abnormality of gait  Pain in joint, pelvic region and thigh, left  S/P lumbar fusion  Weakness of left hip  Weakness of left leg      Subjective Assessment - 04/27/14 1153    Symptoms MD OK,d bending and twisting lifting.   MD felt she was better.  She is to have shoe lift on LT in next week or so.    Currently in Pain? No/denies   Pain Onset More than a month ago   Pain Frequency Intermittent   Aggravating Factors  Activity   Pain Relieving Factors Gentle activity   Effect of Pain on Daily Activities Needs walker    Multiple Pain Sites No                    [facility] Adult PT Treatment/Exercise - 04/27/14 1157    Ambulation/Gait   Ambulation/Gait Assistance 4: Min guard   Ambulation Distance (Feet) 300 Feet   Assistive device Straight cane   Gait Pattern Decreased step length - left;Decreased step length - right;Decreased stance time - left;Decreased hip/knee flexion - left;Decreased weight shift to left;Left genu recurvatum;Trendelenburg   Lumbar Exercises: Standing   Heel Raises 20 reps;2 seconds   Other Standing Lumbar Exercises worked on bending and twisting  and bending with cues to use hips and reach for trunk range.  lifting over head 3 and 5  pounds x 5-8 reps. Bend with squat to touch at knee level    Knee/Hip Exercises: Aerobic   Stationary Bike L3 5 min RPM 40 and above   Elliptical Nustep L4 x 5 min legs only,  then 2 sets x10 L7 LT leg only                     PT Short Term Goals - 04/19/14 1018    PT SHORT TERM GOAL #3   Title Lift Lt leg onto the bed without UE assist   Status Achieved           PT Long Term Goals -  04/08/14 1052    PT LONG TERM GOAL #1   Title Independent with Advanced HEP   Status On-going   PT LONG TERM GOAL #2   Title Pt will report pain improved to 2/10 geneerally and 3-4/10 with walking  With medication   Status Achieved   PT LONG TERM GOAL #3   Title improve LT hip strength to 4-/5 or better to improve gait and independne t with least restricted device.    Status On-going   PT LONG TERM GOAL #4   Title Return to light home tasks safely  She is not doing any activity on feet due to lifting and movement restrictions   Status On-going               Plan - 04/27/14 1249    Clinical Impression Statement No changes. Will continue to work on strength, walking , weight acceptance, trunk mobility   Pt will benefit from skilled therapeutic intervention in order to improve on the following deficits Difficulty walking;Decreased strength;Decreased mobility;Decreased endurance;Decreased activity tolerance;Decreased balance;Pain   Rehab  Potential Good   Clinical Impairments Affecting Rehab Potential Apparent OA LT knee and hip   PT Frequency --  This weel only 2 x due to appointment availability   PT Treatment/Interventions ADLs/Self Care Home Management;Therapeutic activities;Patient/family education;Passive range of motion;Therapeutic exercise;Gait training;Manual techniques;Balance training;Functional mobility training   PT Next Visit Plan Review HEP and progress if able   PT Home Exercise Plan Add to HEP as able   Consulted and Agree with Plan of Care Patient          G-Codes - 04/27/14 1253    Functional Assessment Tool Used LEFS   Functional Limitation Mobility: Walking and moving around   Mobility: Walking and Moving Around Current Status [phone]([id number]) At least 60 percent but less than 80 percent impaired, limited or restricted   Mobility: Walking and Moving Around Goal Status [phone](G8979) At least 40 percent but less than 60 percent impaired, limited or restricted      Problem List Patient Active Problem List   Diagnosis Date Noted  . Scoliosis of lumbar spine 01/05/2014  This is just to let you know we will see Mr Paradise for the 2 weeks before she sees (06/07/14)  you which will be 6 weeks the last 2 weeks 2-3x probably 2x/week.    Caprice Red PT 04/27/2014, 12:54 PM  [facility] [address] [place], [place], [address] Phone: [phone]   Fax:  [phone]

## 2014-04-28 ENCOUNTER — Ambulatory Visit: Payer: Federal, State, Local not specified - PPO

## 2014-04-28 DIAGNOSIS — M25552 Pain in left hip: Secondary | ICD-10-CM | POA: Diagnosis not present

## 2014-04-28 DIAGNOSIS — Z981 Arthrodesis status: Secondary | ICD-10-CM

## 2014-04-28 DIAGNOSIS — R29898 Other symptoms and signs involving the musculoskeletal system: Secondary | ICD-10-CM

## 2014-04-28 NOTE — Patient Instructions (Addendum)
Clam   Lie on side, legs bent 90. Open top knee to ceiling, rotating leg outward. Touch toes to ankle of bottom leg. Close knees, rotating leg inward. Maintain hip position. Repeat __10__ times. Repeat on other side. Do _1-2___ sessions per day.    Hold 2-5 secAbduction: Side Leg Lift (Eccentric) - Side-Lying   Lie on side. Lift top leg slightly higher than shoulder level. Keep top leg straight with body, toes pointing forward. Slowly lower for 3-5 seconds. ___ reps per set, ___ sets per day, ___ days per week. Add ___ lbs when you achieve ___ repetitions.  Copyright  VHI. All rights reserved.    Copyright  VHI. All rights reserved. Abduction Strengthening: Hip Abduction (Side-Lying)   Tighten muscles on front of left thigh, then lift leg ___3-4_ inches from surface, keeping knee locked.  Repeat __5-10__ times per set. Do __1-2__ sets per session. Do ___1-2_ sessions per day.           Have LT leg slightly bent if neeeded  http://orth.exer.us/623   Copyright  VHI. All rights reserved.  All HEP reviewed and minor cues needed for technique. Weakness prevents really good technique.

## 2014-04-28 NOTE — Therapy (Signed)
[place], [place], [address] Phone: [phone]   Fax:  [phone]  Physical Therapy Treatment  Patient Details  Name: Natasha Frederick MRN: [id number] Date of Birth: Jan 29, 1956 Referring Provider:  Eulas Post, MD  Encounter Date: 04/28/2014      PT End of Session - 04/28/14 1148    Visit Number 21   Number of Visits 34   Date for PT Re-Evaluation 06/07/14   PT Start Time 1100   PT Stop Time 1145   PT Time Calculation (min) 45 min   Activity Tolerance Patient tolerated treatment well   Behavior During Therapy [facility] for tasks assessed/performed      Past Medical History  Diagnosis Date  . Endometriosis     Past Surgical History  Procedure Laterality Date  . Breast surgery  1987    bx  . Tonsillectomy  1958  . Oophorectomy  1977    left  . Appendectomy  1963  . Tubal ligation  1986  . Colonoscopy w/ polypectomy    . Planters wart Left 2005  . Anterior lat lumbar fusion Left 01/05/2014    Procedure: LUMBAR TWO TO THREE, LUMBAR LUMBAR THREE TO FOUR, ANTERIOR LATERAL LUMBAR FUSION 2 LEVELS;  Surgeon: Faythe Ghee, MD;  Location: [place] [facility];  Service: Neurosurgery;  Laterality: Left;    There were no vitals taken for this visit.  Visit Diagnosis:  S/P lumbar fusion  Weakness of left hip  Weakness of left leg      Subjective Assessment - 04/28/14 1106    Symptoms Non pain   Currently in Pain? No/denies   Multiple Pain Sites No                    [facility] Adult PT Treatment/Exercise - 04/28/14 1107    Exercises   Exercises --  Reviewed HEP . Needed minor cues for correction.    Knee/Hip Exercises: Aerobic   Stationary Bike L3 6 min RPM 40 and above   Bed Mobility   Supine to Sit Details --  lifting Lt leg without assist x10. 5 reps done well                PT Education - 04/28/14 1147    Education provided Yes   Education Details HEP and added abduction  and clamshells and Int rotation   Person(s) Educated Patient   Methods Explanation;Demonstration;Verbal cues;Handout;Tactile cues   Comprehension Verbalized understanding;Returned demonstration          PT Short Term Goals - 04/28/14 1152    PT SHORT TERM GOAL #1   Title Independent with inital HEP   Status Achieved   PT SHORT TERM GOAL #2   Title Improve LT quads stength to 4/5 to improve gait stability   Status Achieved   PT SHORT TERM GOAL #3   Title Lift Lt leg onto the bed without UE assist   Status Achieved   PT SHORT TERM GOAL #4   Title Reports pain improved to 5/10   Status Achieved           PT Long Term Goals - 04/28/14 1152    PT LONG TERM GOAL #1   Title Independent with Advanced HEP   Status On-going   PT LONG TERM GOAL #2   Title Pt will report pain improved to 2/10 geneerally and 3-4/10 with walking   Status Achieved   PT LONG TERM GOAL #3  Title improve LT hip strength to 4-/5 or better to improve gait and independne t with least restricted device.    Status On-going   PT LONG TERM GOAL #4   Title Return to light home tasks safely   Status Not Met               Plan - 04/28/14 1150    Clinical Impression Statement She was not doing HEP correctly but did better with minor cues.  Weakness limits good technique.      Pt will benefit from skilled therapeutic intervention in order to improve on the following deficits Difficulty walking;Decreased strength;Decreased mobility;Decreased endurance;Decreased activity tolerance;Decreased balance;Pain   Rehab Potential Good   Clinical Impairments Affecting Rehab Potential Apparent OA LT knee and hip   PT Frequency 3x / week   PT Duration 3 weeks   PT Treatment/Interventions ADLs/Self Care Home Management;Therapeutic activities;Patient/family education;Passive range of motion;Therapeutic exercise;Gait training;Manual techniques;Balance training;Functional mobility training   PT Next Visit Plan Review HEP  and progress if able   PT Home Exercise Plan Added clam and hip abduction   Consulted and Agree with Plan of Care Patient          G-Codes - May 23, 2014 1253    Functional Assessment Tool Used LEFS   Functional Limitation Mobility: Walking and moving around   Mobility: Walking and Moving Around Current Status [phone]) At least 60 percent but less than 80 percent impaired, limited or restricted   Mobility: Walking and Moving Around Goal Status [phone]) At least 40 percent but less than 60 percent impaired, limited or restricted      Problem List Patient Active Problem List   Diagnosis Date Noted  . Scoliosis of lumbar spine 01/05/2014    Darrel Hoover PT 04/28/2014, 11:54 AM  [facility] [address] [place], [place], [address] Phone: [phone]   Fax:  [phone]

## 2014-05-04 ENCOUNTER — Ambulatory Visit: Payer: Federal, State, Local not specified - PPO | Admitting: Physical Therapy

## 2014-05-04 DIAGNOSIS — R29898 Other symptoms and signs involving the musculoskeletal system: Secondary | ICD-10-CM

## 2014-05-04 DIAGNOSIS — Z981 Arthrodesis status: Secondary | ICD-10-CM

## 2014-05-04 DIAGNOSIS — M25552 Pain in left hip: Secondary | ICD-10-CM | POA: Diagnosis not present

## 2014-05-04 NOTE — Therapy (Signed)
[place], [place], [address] Phone: [phone]   Fax:  [phone]  Physical Therapy Treatment  Patient Details  Name: Natasha Frederick MRN: [id number] Date of Birth: [birth year] Referring Provider:  Eulas Post, MD  Encounter Date: 05/04/2014      PT End of Session - 05/04/14 1232    Visit Number 22   Number of Visits 34   Date for PT Re-Evaluation 06/07/14   PT Start Time 1103   PT Stop Time 1212   PT Time Calculation (min) 69 min   Activity Tolerance Patient tolerated treatment well;Patient limited by pain      Past Medical History  Diagnosis Date  . Endometriosis     Past Surgical History  Procedure Laterality Date  . Breast surgery  1987    bx  . Tonsillectomy  1958  . Oophorectomy  1977    left  . Appendectomy  1963  . Tubal ligation  1986  . Colonoscopy w/ polypectomy    . Planters wart Left 2005  . Anterior lat lumbar fusion Left 01/05/2014    Procedure: LUMBAR TWO TO THREE, LUMBAR LUMBAR THREE TO FOUR, ANTERIOR LATERAL LUMBAR FUSION 2 LEVELS;  Surgeon: Faythe Ghee, MD;  Location: [facility];  Service: Neurosurgery;  Laterality: Left;    There were no vitals taken for this visit.  Visit Diagnosis:  Weakness of left hip  Weakness of left leg  S/P lumbar fusion      Subjective Assessment - 05/04/14 1112    Symptoms Has a  heel lift in Shoe  now.   Took muscle relaxer and muscle relaxer and pain pills prior to PT.  She is doing her home exercises at home.  Heel lift makes her a little sore.   Currently in Pain? No/denies   Pain Location Hip   Pain Orientation Left   Pain Descriptors / Indicators Sore   Pain Frequency Intermittent   Multiple Pain Sites No                    [facility] Adult PT Treatment/Exercise - 05/04/14 1103    Lumbar Exercises: Supine   Clam 10 reps  also peformed sidelying and alternating side to side   Bent Knee Raise 5 seconds  with  cues   Other Supine Lumbar Exercises multiple exercises she does at home that are not on our flow sheet that focus on control and strengthening.   Knee/Hip Exercises: Aerobic   Stationary Bike 10 minutes   Knee/Hip Exercises: Seated   Other Seated Knee Exercises pelvic rock added to home.  holding neutral with march and cues   Other Seated Knee Exercises press into ball10   Knee/Hip Exercises: Supine   Heel Slides 5 reps                PT Education - 05/04/14 1231    Education provided Yes   Education Details sitting pelvic rock, standard issued from drawer          PT Short Term Goals - 04/28/14 1152    PT SHORT TERM GOAL #1   Title Independent with inital HEP   Status Achieved   PT SHORT TERM GOAL #2   Title Improve LT quads stength to 4/5 to improve gait stability   Status Achieved   PT SHORT TERM GOAL #3   Title Lift Lt leg onto the bed without UE assist   Status Achieved  PT SHORT TERM GOAL #4   Title Reports pain improved to 5/10   Status Achieved           PT Long Term Goals - 05/04/14 1235    PT LONG TERM GOAL #1   Title Independent with Advanced HEP   Time 12   Period Weeks   Status On-going   PT LONG TERM GOAL #3   Title improve LT hip strength to 4-/5 or better to improve gait and independne t with least restricted device.    Time 12   Period Weeks   Status On-going   PT LONG TERM GOAL #4   Title Return to light home tasks safely   Time 12   Period Weeks   Status Unable to assess               Plan - 05/04/14 1233    Clinical Impression Statement Patient has 1 cm heel lift which increases pain from hip to knee.  She will remove it if she has increased pain with walking.  focus today on stabilization and strengthening.  No new goals met   PT Next Visit Plan review pelvic rock.  continue exercise.    Consulted and Agree with Plan of Care Patient        Problem List Patient Active Problem List   Diagnosis Date Noted  .  Scoliosis of lumbar spine 01/05/2014   Melvenia Needles, PTA 05/04/2014 12:38 PM Phone: [phone] Fax: [phone]  [facility] 05/04/2014, 12:38 PM  [place], [place], [address] Phone: [phone]   Fax:  [phone]

## 2014-05-05 ENCOUNTER — Ambulatory Visit: Payer: Federal, State, Local not specified - PPO

## 2014-05-05 DIAGNOSIS — Z981 Arthrodesis status: Secondary | ICD-10-CM

## 2014-05-05 DIAGNOSIS — M25552 Pain in left hip: Secondary | ICD-10-CM | POA: Diagnosis not present

## 2014-05-05 DIAGNOSIS — R29898 Other symptoms and signs involving the musculoskeletal system: Secondary | ICD-10-CM

## 2014-05-05 DIAGNOSIS — R269 Unspecified abnormalities of gait and mobility: Secondary | ICD-10-CM

## 2014-05-05 NOTE — Therapy (Signed)
[facility] [address] [place], [place], [address] Phone: [phone]   Fax:  [phone]  Physical Therapy Treatment  Patient Details  Name: Natasha Frederick MRN: [id number] Date of Birth: Sep 02, 1955 Referring Provider:  Reinaldo Meeker, MD  Encounter Date: 05/05/2014      PT End of Session - 05/05/14 1206    Visit Number 23   Number of Visits 34   Date for PT Re-Evaluation 06/07/14   PT Start Time 1105   PT Stop Time 1203   PT Time Calculation (min) 58 min   Activity Tolerance Patient tolerated treatment well   Behavior During Therapy [facility] for tasks assessed/performed      Past Medical History  Diagnosis Date  . Endometriosis     Past Surgical History  Procedure Laterality Date  . Breast surgery  1987    bx  . Tonsillectomy  1958  . Oophorectomy  1977    left  . Appendectomy  1963  . Tubal ligation  1986  . Colonoscopy w/ polypectomy    . Planters wart Left 2005  . Anterior lat lumbar fusion Left 01/05/2014    Procedure: LUMBAR TWO TO THREE, LUMBAR LUMBAR THREE TO FOUR, ANTERIOR LATERAL LUMBAR FUSION 2 LEVELS;  Surgeon: Reinaldo Meeker, MD;  Location: [facility];  Service: Neurosurgery;  Laterality: Left;    There were no vitals taken for this visit.  Visit Diagnosis:  Weakness of left hip  Weakness of left leg  Abnormality of gait  S/P lumbar fusion  Pain in joint, pelvic region and thigh, left      Subjective Assessment - 05/05/14 1143    Symptoms LT hip soreness wiith new heel lift  but this is improving   Currently in Pain? No/denies   Multiple Pain Sites No          [facility] PT Assessment - 05/05/14 1125    Observation/Other Assessments   Observations Pelvis appears level with heel lift in shoe                  [facility] Adult PT Treatment/Exercise - 05/05/14 1127    Ambulation/Gait   Ambulation Distance (Feet) 300 Feet   Assistive device Straight cane   Knee/Hip Exercises: Aerobic    Stationary Bike L3 7 min    Knee/Hip Exercises: Seated   Long Arc Quad Strengthening;Left;Right;1 set   [employer] Weight 6 lbs.   Long Arc Quad Limitations 10 sec 25 reps   Other Seated Knee Exercises Pelvic tilt with AP and lateral tilts   Knee/Hip Exercises: Supine   Bridges Strengthening;Both;1 set;15 reps   Bridges Limitations also did 10 bridges with ball squeeze and 10 reps with  adduction into green band   Straight Leg Raises AROM;1 set;15 reps   Other Supine Knee Exercises worked on lifting leg to mat with knee extended and flexed muliple reps   Other Supine Knee Exercises Hip extension with pressure into heel into 4 inch box.  multiple reps and tacle and verbal cues to facilitate hip stability   Knee/Hip Exercises: Sidelying   Hip ABduction AROM;1 set;15 reps  assisted   Clams x15 RT an LT   Other Sidelying Knee Exercises LT hip IR x 12                PT Education - 05/04/14 1231    Education provided Yes   Education Details sitting pelvic rock, standard issued from drawer  PT Short Term Goals - 05/05/14 1141    PT SHORT TERM GOAL #1   Title Independent with inital HEP   Status Achieved   PT SHORT TERM GOAL #2   Title Improve LT quads stength to 4/5 to improve gait stability   Status Achieved   PT SHORT TERM GOAL #3   Title Lift Lt leg onto the bed without UE assist   Status Achieved   PT SHORT TERM GOAL #4   Title Reports pain improved to 5/10   Status Achieved           PT Long Term Goals - 05/05/14 1141    PT LONG TERM GOAL #1   Title Independent with Advanced HEP   Status On-going   PT LONG TERM GOAL #2   Title Pt will report pain improved to 2/10 geneerally and 3-4/10 with walking   Status Achieved   PT LONG TERM GOAL #3   Title improve LT hip strength to 4-/5 or better to improve gait and independent with least restricted device.    Status On-going   PT LONG TERM GOAL #4   Title Return to light home tasks safely   Status  Achieved               Plan - 05/05/14 1207    Clinical Impression Statement No changes noted. Still unsteady without device.    Pt will benefit from skilled therapeutic intervention in order to improve on the following deficits Difficulty walking;Decreased strength;Decreased mobility;Decreased endurance;Decreased activity tolerance;Decreased balance;Pain   Rehab Potential Good   Clinical Impairments Affecting Rehab Potential Apparent OA LT knee and hip   PT Frequency 3x / week   PT Duration 2 weeks  then decrease to 2x/week for 4 weeks after   PT Treatment/Interventions ADLs/Self Care Home Management;Therapeutic activities;Patient/family education;Passive range of motion;Therapeutic exercise;Gait training;Manual techniques;Balance training;Functional mobility training   PT Next Visit Plan review pelvic rock.  continue exercise.    Consulted and Agree with Plan of Care Patient        Problem List Patient Active Problem List   Diagnosis Date Noted  . Scoliosis of lumbar spine 01/05/2014    Caprice RedChasse, Stephen M PT 05/05/2014, 12:10 PM  [facility] [address] [place], [place], [address] Phone: [phone]   Fax:  [phone]

## 2014-05-07 ENCOUNTER — Ambulatory Visit: Payer: Federal, State, Local not specified - PPO

## 2014-05-07 DIAGNOSIS — R29898 Other symptoms and signs involving the musculoskeletal system: Secondary | ICD-10-CM

## 2014-05-07 DIAGNOSIS — R269 Unspecified abnormalities of gait and mobility: Secondary | ICD-10-CM

## 2014-05-07 DIAGNOSIS — Z981 Arthrodesis status: Secondary | ICD-10-CM

## 2014-05-07 DIAGNOSIS — M25552 Pain in left hip: Secondary | ICD-10-CM

## 2014-05-07 NOTE — Therapy (Signed)
[facility] [address] [place], [place], [address] Phone: [phone]   Fax:  [phone]  Physical Therapy Treatment  Patient Details  Name: Natasha Frederick MRN: [id number] Date of Birth: 05/15/1955 Referring Provider:  Kristian CoveyBurchette, Bruce W, MD  Encounter Date: 05/07/2014      PT End of Session - 05/07/14 1202    Visit Number 24   Number of Visits 34   Date for PT Re-Evaluation 06/07/14   PT Start Time 1100   PT Stop Time 1145   PT Time Calculation (min) 45 min   Activity Tolerance Patient tolerated treatment well   Behavior During Therapy [facility] for tasks assessed/performed      Past Medical History  Diagnosis Date  . Endometriosis     Past Surgical History  Procedure Laterality Date  . Breast surgery  1987    bx  . Tonsillectomy  1958  . Oophorectomy  1977    left  . Appendectomy  1963  . Tubal ligation  1986  . Colonoscopy w/ polypectomy    . Planters wart Left 2005  . Anterior lat lumbar fusion Left 01/05/2014    Procedure: LUMBAR TWO TO THREE, LUMBAR LUMBAR THREE TO FOUR, ANTERIOR LATERAL LUMBAR FUSION 2 LEVELS;  Surgeon: Reinaldo Meekerandy O Kritzer, MD;  Location: [facility];  Service: Neurosurgery;  Laterality: Left;    There were no vitals taken for this visit.  Visit Diagnosis:  Weakness of left leg  Weakness of left hip  Abnormality of gait  S/P lumbar fusion  Pain in joint, pelvic region and thigh, left      Subjective Assessment - 05/07/14 1114    Symptoms LT hip soreness but i am pushing some.    Currently in Pain? Yes   Pain Score 2    Pain Location Hip   Pain Orientation Left   Pain Descriptors / Indicators Sore   Pain Type Surgical pain   Pain Radiating Towards LT thigh   Pain Onset More than a month ago   Pain Frequency Intermittent   Multiple Pain Sites No                    [facility] Adult PT Treatment/Exercise - 05/07/14 1115    Knee/Hip Exercises: Aerobic   Stationary Bike L3 11  min     We spent thee bulk of the time with LT lateral hip strength and pelvic position/control with leg movements. She had 2-3 bouts of what she called spasms of muscle on lateral left hip and this has been limmiting her exercise at home.   She also walked with SPC and did stairs with one and 2 rails. She was unstable doing step over step but safe with 2 rails leading with RT leg up , Lt down.               PT Short Term Goals - 05/05/14 1141    PT SHORT TERM GOAL #1   Title Independent with inital HEP   Status Achieved   PT SHORT TERM GOAL #2   Title Improve LT quads stength to 4/5 to improve gait stability   Status Achieved   PT SHORT TERM GOAL #3   Title Lift Lt leg onto the bed without UE assist   Status Achieved   PT SHORT TERM GOAL #4   Title Reports pain improved to 5/10   Status Achieved           PT Long Term Goals -  05/05/14 1141    PT LONG TERM GOAL #1   Title Independent with Advanced HEP   Status On-going   PT LONG TERM GOAL #2   Title Pt will report pain improved to 2/10 geneerally and 3-4/10 with walking   Status Achieved   PT LONG TERM GOAL #3   Title improve LT hip strength to 4-/5 or better to improve gait and independent with least restricted device.    Status On-going   PT LONG TERM GOAL #4   Title Return to light home tasks safely   Status Achieved               Plan - 05/07/14 1203    Clinical Impression Statement Imprved LT abduction with ER after active/passive and isometric exercise for this  She is not stable without support on LT leg with walking Strength imptroved I thin to point she should be more stable . still beleive OA of LT hip and knee limiting ambulation   Pt will benefit from skilled therapeutic intervention in order to improve on the following deficits Difficulty walking;Decreased strength;Decreased mobility;Decreased endurance;Decreased activity tolerance;Decreased balance;Pain   Rehab Potential Good   Clinical  Impairments Affecting Rehab Potential Apparent OA LT knee and hip   PT Frequency 3x / week   PT Treatment/Interventions ADLs/Self Care Home Management;Therapeutic activities;Patient/family education;Passive range of motion;Therapeutic exercise;Gait training;Manual techniques;Balance training;Functional mobility training   PT Next Visit Plan Continue with hip strenght        Problem List Patient Active Problem List   Diagnosis Date Noted  . Scoliosis of lumbar spine 01/05/2014    Caprice Red PT 05/07/2014, 12:14 PM  [facility] [address] [place], [place], [address] Phone: [phone]   Fax:  [phone]

## 2014-05-10 ENCOUNTER — Ambulatory Visit: Payer: Federal, State, Local not specified - PPO | Attending: Neurosurgery

## 2014-05-10 DIAGNOSIS — R269 Unspecified abnormalities of gait and mobility: Secondary | ICD-10-CM

## 2014-05-10 DIAGNOSIS — M419 Scoliosis, unspecified: Secondary | ICD-10-CM | POA: Diagnosis not present

## 2014-05-10 DIAGNOSIS — M79652 Pain in left thigh: Secondary | ICD-10-CM | POA: Diagnosis not present

## 2014-05-10 DIAGNOSIS — Z5189 Encounter for other specified aftercare: Secondary | ICD-10-CM | POA: Insufficient documentation

## 2014-05-10 DIAGNOSIS — M25552 Pain in left hip: Secondary | ICD-10-CM

## 2014-05-10 DIAGNOSIS — R29898 Other symptoms and signs involving the musculoskeletal system: Secondary | ICD-10-CM

## 2014-05-10 DIAGNOSIS — R531 Weakness: Secondary | ICD-10-CM | POA: Diagnosis not present

## 2014-05-10 DIAGNOSIS — Z981 Arthrodesis status: Secondary | ICD-10-CM

## 2014-05-10 NOTE — Therapy (Signed)
[facility] [address] [place], [place], [address] Phone: [phone]   Fax:  [phone]  Physical Therapy Treatment  Patient Details  Name: Natasha BeckmannJudith Frederick MRN: [id number] Date of Birth: 07/10/1955 Referring Provider:  Kristian CoveyBurchette, Bruce W, MD  Encounter Date: 05/10/2014      PT End of Session - 05/10/14 1320    Visit Number 25   Number of Visits 34   Date for PT Re-Evaluation 06/07/14   PT Start Time 1208   PT Stop Time 1250   PT Time Calculation (min) 42 min   Activity Tolerance Patient tolerated treatment well   Behavior During Therapy [facility] for tasks assessed/performed      Past Medical History  Diagnosis Date  . Endometriosis     Past Surgical History  Procedure Laterality Date  . Breast surgery  1987    bx  . Tonsillectomy  1958  . Oophorectomy  1977    left  . Appendectomy  1963  . Tubal ligation  1986  . Colonoscopy w/ polypectomy    . Planters wart Left 2005  . Anterior lat lumbar fusion Left 01/05/2014    Procedure: LUMBAR TWO TO THREE, LUMBAR LUMBAR THREE TO FOUR, ANTERIOR LATERAL LUMBAR FUSION 2 LEVELS;  Surgeon: Reinaldo Meekerandy O Kritzer, MD;  Location: [facility];  Service: Neurosurgery;  Laterality: Left;    There were no vitals taken for this visit.  Visit Diagnosis:  Weakness of left leg  Weakness of left hip  Abnormality of gait  S/P lumbar fusion  Pain in joint, pelvic region and thigh, left      Subjective Assessment - 05/10/14 1213    Symptoms Did less exercise thid weekend so muscle feels better today.    Currently in Pain? Yes   Pain Score 1    Pain Location Hip   Pain Orientation Left   Pain Descriptors / Indicators Sore   Pain Type Surgical pain   Pain Onset More than a month ago   Aggravating Factors  Exercise /stretching   Pain Relieving Factors rest    Multiple Pain Sites No     Treatment entailed use of bike, gait with SPC with CGA  400 feet, balnce and LE awarness for weight  bearing to LT LE and stability on that leg and lifting to cabinets 5 pounds x10, lifting and body rotation 01/18/14/16 pounds x 5-10 reps, pushing cart with 20 pounds forward 100 feet and backward 25 feet, and carry two hand 50 feet with  5 pounds. Pain 1-2/10 LT hip. 2 catches in hip during session                         PT Short Term Goals - 05/05/14 1141    PT SHORT TERM GOAL #1   Title Independent with inital HEP   Status Achieved   PT SHORT TERM GOAL #2   Title Improve LT quads stength to 4/5 to improve gait stability   Status Achieved   PT SHORT TERM GOAL #3   Title Lift Lt leg onto the bed without UE assist   Status Achieved   PT SHORT TERM GOAL #4   Title Reports pain improved to 5/10   Status Achieved           PT Long Term Goals - 05/05/14 1141    PT LONG TERM GOAL #1   Title Independent with Advanced HEP   Status On-going   PT LONG  TERM GOAL #2   Title Pt will report pain improved to 2/10 geneerally and 3-4/10 with walking   Status Achieved   PT LONG TERM GOAL #3   Title improve LT hip strength to 4-/5 or better to improve gait and independent with least restricted device.    Status On-going   PT LONG TERM GOAL #4   Title Return to light home tasks safely   Status Achieved               Plan - 05/10/14 1320    Clinical Impression Statement She did well handling weight with lifting and moveint in rotaion , pushing cart,carry weight but is not steady to do with SPc or without CGA   Pt will benefit from skilled therapeutic intervention in order to improve on the following deficits Difficulty walking;Decreased strength;Decreased mobility;Decreased endurance;Decreased activity tolerance;Decreased balance;Pain   Rehab Potential Good   PT Frequency 3x / week   PT Duration 2 weeks   PT Next Visit Plan Continue with hip strength, mobility/lifting gait and balance   Consulted and Agree with Plan of Care Patient        Problem  List Patient Active Problem List   Diagnosis Date Noted  . Scoliosis of lumbar spine 01/05/2014    Caprice Red PT 05/10/2014, 1:24 PM  [facility] [address] [place], [place], [address] Phone: [phone]   Fax:  [phone]

## 2014-05-12 ENCOUNTER — Ambulatory Visit: Payer: Federal, State, Local not specified - PPO | Admitting: Rehabilitation

## 2014-05-12 DIAGNOSIS — M25552 Pain in left hip: Secondary | ICD-10-CM | POA: Diagnosis not present

## 2014-05-12 DIAGNOSIS — Z981 Arthrodesis status: Secondary | ICD-10-CM

## 2014-05-12 DIAGNOSIS — R269 Unspecified abnormalities of gait and mobility: Secondary | ICD-10-CM

## 2014-05-12 DIAGNOSIS — R29898 Other symptoms and signs involving the musculoskeletal system: Secondary | ICD-10-CM

## 2014-05-12 NOTE — Therapy (Signed)
[facility] [address] [place], [place], [address] Phone: [phone]   Fax:  [phone]  Physical Therapy Treatment  Patient Details  Name: Bella Brummet MRN: [id number] Date of Birth: 11/18/1955 Referring Provider:  Reinaldo Meeker, MD  Encounter Date: 05/12/2014      PT End of Session - 05/12/14 1326    Visit Number 26   Number of Visits 34   Date for PT Re-Evaluation 06/07/14   PT Start Time 1100   PT Stop Time 1145   PT Time Calculation (min) 45 min      Past Medical History  Diagnosis Date  . Endometriosis     Past Surgical History  Procedure Laterality Date  . Breast surgery  1987    bx  . Tonsillectomy  1958  . Oophorectomy  1977    left  . Appendectomy  1963  . Tubal ligation  1986  . Colonoscopy w/ polypectomy    . Planters wart Left 2005  . Anterior lat lumbar fusion Left 01/05/2014    Procedure: LUMBAR TWO TO THREE, LUMBAR LUMBAR THREE TO FOUR, ANTERIOR LATERAL LUMBAR FUSION 2 LEVELS;  Surgeon: Reinaldo Meeker, MD;  Location: [facility];  Service: Neurosurgery;  Laterality: Left;    There were no vitals taken for this visit.  Visit Diagnosis:  Weakness of left leg  Weakness of left hip  Abnormality of gait  S/P lumbar fusion  Pain in joint, pelvic region and thigh, left      Subjective Assessment - 05/12/14 1325    Symptoms I feel like I have improved a lot in last week and a half. The heel lift is causing some pain in my hip                    [facility] Adult PT Treatment/Exercise - 05/12/14 1114    Lumbar Exercises: Seated   Other Seated Lumbar Exercises Seated on Sit Fit  and feet on 4 inch step performing head turns, trunk rotations, marches, ball squeezes, pelvic tilts, circles and weight shifting to challenge core stabillity. Multiple episodes on near LOB, pt needs UE assist to maintain seated balance on sit fit.    Knee/Hip Exercises: Aerobic   Stationary Bike L2 x 11 min                   PT Short Term Goals - 05/05/14 1141    PT SHORT TERM GOAL #1   Title Independent with inital HEP   Status Achieved   PT SHORT TERM GOAL #2   Title Improve LT quads stength to 4/5 to improve gait stability   Status Achieved   PT SHORT TERM GOAL #3   Title Lift Lt leg onto the bed without UE assist   Status Achieved   PT SHORT TERM GOAL #4   Title Reports pain improved to 5/10   Status Achieved           PT Long Term Goals - 05/05/14 1141    PT LONG TERM GOAL #1   Title Independent with Advanced HEP   Status On-going   PT LONG TERM GOAL #2   Title Pt will report pain improved to 2/10 geneerally and 3-4/10 with walking   Status Achieved   PT LONG TERM GOAL #3   Title improve LT hip strength to 4-/5 or better to improve gait and independent with least restricted device.    Status On-going   PT LONG  TERM GOAL #4   Title Return to light home tasks safely   Status Achieved               Plan - 05/12/14 1326    Clinical Impression Statement Pt's core was well by seated balance cushion exercises. She was able to maintain her balance with occassionl need of UEs   PT Next Visit Plan Continue with hip strength, mobility/lifting gait and balance        Problem List Patient Active Problem List   Diagnosis Date Noted  . Scoliosis of lumbar spine 01/05/2014    Sherrie MustacheDonoho, Jessica McGee, PTA 05/12/2014, 1:33 PM  [facility] [address] [place], [place], [address] Phone: [phone]   Fax:  [phone]

## 2014-05-14 ENCOUNTER — Ambulatory Visit: Payer: Federal, State, Local not specified - PPO

## 2014-05-14 DIAGNOSIS — M25552 Pain in left hip: Secondary | ICD-10-CM | POA: Diagnosis not present

## 2014-05-14 DIAGNOSIS — R269 Unspecified abnormalities of gait and mobility: Secondary | ICD-10-CM

## 2014-05-14 DIAGNOSIS — R29898 Other symptoms and signs involving the musculoskeletal system: Secondary | ICD-10-CM

## 2014-05-14 DIAGNOSIS — Z981 Arthrodesis status: Secondary | ICD-10-CM

## 2014-05-14 NOTE — Therapy (Signed)
[facility] [address] [place], [place], [address] Phone: [phone]   Fax:  [phone]  Physical Therapy Treatment  Patient Details  Name: Natasha BeckmannJudith Frederick MRN: [id number] Date of Birth: 08/26/1955 Referring Provider:  Kristian CoveyBurchette, Bruce W, MD  Encounter Date: 05/14/2014      PT End of Session - 05/14/14 1158    Visit Number 27   Number of Visits 34   Date for PT Re-Evaluation 06/07/14   PT Start Time 1015   PT Stop Time 1112   PT Time Calculation (min) 57 min   Activity Tolerance Patient tolerated treatment well   Behavior During Therapy [facility] for tasks assessed/performed      Past Medical History  Diagnosis Date  . Endometriosis     Past Surgical History  Procedure Laterality Date  . Breast surgery  1987    bx  . Tonsillectomy  1958  . Oophorectomy  1977    left  . Appendectomy  1963  . Tubal ligation  1986  . Colonoscopy w/ polypectomy    . Planters wart Left 2005  . Anterior lat lumbar fusion Left 01/05/2014    Procedure: LUMBAR TWO TO THREE, LUMBAR LUMBAR THREE TO FOUR, ANTERIOR LATERAL LUMBAR FUSION 2 LEVELS;  Surgeon: Reinaldo Meekerandy O Kritzer, MD;  [facility]: [facility];  Service: Neurosurgery;  Laterality: Left;    There were no vitals taken for this visit.  Visit Diagnosis:  Weakness of left leg  Weakness of left hip  Abnormality of gait  S/P lumbar fusion  Pain in joint, pelvic region and thigh, left      Subjective Assessment - 05/14/14 1014    Symptoms Doing fine   Currently in Pain? Yes   Pain Score 2    Pain Location Hip   Pain Orientation Left   Pain Type Chronic pain   Pain Onset More than a month ago   Pain Frequency Intermittent   Multiple Pain Sites No                    [facility] Adult PT Treatment/Exercise - 05/14/14 1148    Ambulation/Gait   Ambulation/Gait Assistance 4: Min guard   Knee/Hip Exercises: Aerobic   Stationary Bike L3 x 12 min   Knee/Hip Exercises: Standing    Other Standing Knee Exercises Worked on weight acceptance and strength LT leg with side of body to wall with attempt to pick up RT leg and to pick up left leg. . she was cued for gluteal contraction and guarded with belt. She was unable to pick up RT leg even with SPC and body support on wall  to RT/.   We also worked on pressure of wall side leg into wall for abduction strength   Knee/Hip Exercises: Seated   Long Arc Quad Strengthening;Right;Left;1 set;15 reps   Con[employer]way[employer] Arc Quad Limitations sitting on sit fit for balance   Other Seated Knee Exercises On sit fit with red ball with trunk rotation , flexion and extension at shoulders.  and with single arm reach to grab and give ball multiple reps. For balance and stability   Knee/Hip Exercises: Supine   Other Supine Knee Exercises worked on hip abduction and ER  multiple reps (clam). Her range is much improved                  PT Short Term Goals - 05/14/14 1201    PT SHORT TERM GOAL #1   Title Independent with inital  HEP   Status Achieved   PT SHORT TERM GOAL #2   Title Improve LT quads stength to 4/5 to improve gait stability   Status Achieved   PT SHORT TERM GOAL #3   Title Lift Lt leg onto the bed without UE assist   Status Achieved   PT SHORT TERM GOAL #4   Title Reports pain improved to 5/10   Status Achieved           PT Long Term Goals - 05/14/14 1202    PT LONG TERM GOAL #1   Title Independent with Advanced HEP   Status On-going   PT LONG TERM GOAL #2   Title Pt will report pain improved to 2/10 generally and 3-4/10 with walking   Status Achieved   PT LONG TERM GOAL #3   Title improve LT hip strength to 4-/5 or better to improve gait and independent with least restricted device.    Status On-going   PT LONG TERM GOAL #4   Title Return to light home tasks safely   Status Achieved   PT LONG TERM GOAL #5   Title Walk with unilateral device safely in home.    Time 6   Period Weeks   Additional Long Term Goals    Additional Long Term Goals Yes   PT LONG TERM GOAL #6   Title Safely walk with 15 pounds with unilateral device for transporting items in home   Time 6   Period Weeks   Status New   PT LONG TERM GOAL #7   Title She will report she is able to do laundry safely at machines but not carrying basket.    Time 6   Period Weeks   Status New               Plan - 05/14/14 1158    Clinical Impression Statement Her weakness and instability on LT LE makes walking off walker impossible at this tiem. She is bearing more weight to LT LE but is unable to bear (my guess) more than 25% of her body weight o LT leg   Pt will benefit from skilled therapeutic intervention in order to improve on the following deficits Difficulty walking;Decreased strength;Decreased mobility;Decreased endurance;Decreased activity tolerance;Decreased balance;Pain   Rehab Potential Good   Clinical Impairments Affecting Rehab Potential Apparent OA LT knee and hip   PT Frequency 3x / week   PT Duration --  1 week then 2x/week for 2-6 weeks   PT Next Visit Plan Continue with hip strength, mobility/lifting gait and balance   Consulted and Agree with Plan of Care Patient        Problem List Patient Active Problem List   Diagnosis Date Noted  . Scoliosis of lumbar spine 01/05/2014    Caprice Red PT 05/14/2014, 12:05 PM  [facility] [address] [place], [place], [address] Phone: [phone]   Fax:  [phone]

## 2014-05-17 ENCOUNTER — Ambulatory Visit: Payer: Federal, State, Local not specified - PPO

## 2014-05-17 DIAGNOSIS — R29898 Other symptoms and signs involving the musculoskeletal system: Secondary | ICD-10-CM

## 2014-05-17 DIAGNOSIS — M25552 Pain in left hip: Secondary | ICD-10-CM

## 2014-05-17 DIAGNOSIS — Z981 Arthrodesis status: Secondary | ICD-10-CM

## 2014-05-17 DIAGNOSIS — R269 Unspecified abnormalities of gait and mobility: Secondary | ICD-10-CM

## 2014-05-17 NOTE — Therapy (Signed)
[facility] [address] [place], [place], [address] Phone: [phone]   Fax:  [phone]  Physical Therapy Treatment  Patient Details  Name: Natasha BeckmannJudith Frederick MRN: [id number] Date of Birth: 07/06/1955 Referring Provider:  Kristian CoveyBurchette, Bruce W, MD  Encounter Date: 05/17/2014      PT End of Session - 05/17/14 1219    Visit Number 28   Date for PT Re-Evaluation 06/07/14   PT Start Time 1105   PT Stop Time 1155   PT Time Calculation (min) 50 min   Activity Tolerance Patient tolerated treatment well   Behavior During Therapy [facility] for tasks assessed/performed      Past Medical History  Diagnosis Date  . Endometriosis     Past Surgical History  Procedure Laterality Date  . Breast surgery  1987    bx  . Tonsillectomy  1958  . Oophorectomy  1977    left  . Appendectomy  1963  . Tubal ligation  1986  . Colonoscopy w/ polypectomy    . Planters wart Left 2005  . Anterior lat lumbar fusion Left 01/05/2014    Procedure: LUMBAR TWO TO THREE, LUMBAR LUMBAR THREE TO FOUR, ANTERIOR LATERAL LUMBAR FUSION 2 LEVELS;  Surgeon: Reinaldo Meekerandy O Kritzer, MD;  Location: [facility];  Service: Neurosurgery;  Laterality: Left;    There were no vitals taken for this visit.  Visit Diagnosis:  Weakness of left leg  Weakness of left hip  Abnormality of gait  S/P lumbar fusion  Pain in joint, pelvic region and thigh, left      Subjective Assessment - 05/17/14 1120    Symptoms No pain                    [facility] Adult PT Treatment/Exercise - 05/17/14 1120    Ambulation/Gait   Ambulation/Gait Assistance 4: Min guard   4 inch step step over with RT arm support, unable with LT   Lumbar Exercises: Seated   Other Seated Lumbar Exercises Seated on Sit Fit  and feet on 4 inch step performing head turns, trunk rotations, marches, ball squeezes, pelvic tilts, circles and weight shifting to challenge core stabillity. Multiple episodes on near LOB,  pt needs UE assist to maintain seated balance on sit fit.    Lumbar Exercises: Supine   Other Supine Lumbar Exercises Ball between knee  with one foot lift then rotate to opposite direction of foot lift with pelvic lift.  x10 RT and LT   Knee/Hip Exercises: Aerobic   Stationary Bike 3 8 min   Knee/Hip Exercises: Standing   Other Standing Knee Exercises Worked on weight acceptance and strength LT leg with side of body to wall with attempt to pick up RT leg and to pick up left leg. . she was cued for gluteal contraction and guarded with belt. She was unable to pick up RT leg even with SPC and body support on wall  to RT/.   We also worked on pressure of wall side leg into wall for abduction strength. We also worked on active pressure of LT leg  to promote stability for RT leg lift  off floor  but she was not able   Knee/Hip Exercises: Seated   Long Arc Quad Strengthening;Right;Left;1 set;15 reps   Long Arc Quad Weight 6 lbs.   Long [employer] Quad Limitations 50 reps RT and LT.    Knee/Hip Exercises: Supine   Quad Sets Strengthening;Both;1 set;20 reps  with ball squeeze  PT Short Term Goals - 05/14/14 1201    PT SHORT TERM GOAL #1   Title Independent with inital HEP   Status Achieved   PT SHORT TERM GOAL #2   Title Improve LT quads stength to 4/5 to improve gait stability   Status Achieved   PT SHORT TERM GOAL #3   Title Lift Lt leg onto the bed without UE assist   Status Achieved   PT SHORT TERM GOAL #4   Title Reports pain improved to 5/10   Status Achieved           PT Long Term Goals - 05/14/14 1202    PT LONG TERM GOAL #1   Title Independent with Advanced HEP   Status On-going   PT LONG TERM GOAL #2   Title Pt will report pain improved to 2/10 generally and 3-4/10 with walking   Status Achieved   PT LONG TERM GOAL #3   Title improve LT hip strength to 4-/5 or better to improve gait and independent with least restricted device.    Status On-going    PT LONG TERM GOAL #4   Title Return to light home tasks safely   Status Achieved   PT LONG TERM GOAL #5   Title Walk with unilateral device safely in home.    Time 6   Period Weeks   Additional Long Term Goals   Additional Long Term Goals Yes   PT LONG TERM GOAL #6   Title Safely walk with 15 pounds with unilateral device for transporting items in home   Time 6   Period Weeks   Status New   PT LONG TERM GOAL #7   Title She will report she is able to do laundry safely at machines but not carrying basket.    Time 6   Period Weeks   Status New               Plan - 05/17/14 1219    Clinical Impression Statement She is doing veryy well with balance on sit fit and with active LT hip abduciton now. She is still not able to put full pressure Lt leg and lift RT leg. She was unable to do do 4 inch step sep over without support on opposit UE.     Pt will benefit from skilled therapeutic intervention in order to improve on the following deficits Difficulty walking;Decreased strength;Decreased mobility;Decreased endurance;Decreased activity tolerance;Decreased balance;Pain   Rehab Potential Good   Clinical Impairments Affecting Rehab Potential Apparent OA LT knee and hip   PT Frequency 3x / week   PT Duration --  1 week then 2x/week   PT Treatment/Interventions ADLs/Self Care Home Management;Therapeutic activities;Patient/family education;Passive range of motion;Therapeutic exercise;Gait training;Manual techniques;Balance training;Functional mobility training   PT Next Visit Plan Continue with hip strength, mobility/lifting gait and balance   PT Home Exercise Plan Added clam and hip abduction   Consulted and Agree with Plan of Care Patient        Problem List Patient Active Problem List   Diagnosis Date Noted  . Scoliosis of lumbar spine 01/05/2014    Caprice Red PT 05/17/2014, 12:22 PM  [facility] [employer] [facility] [address] [place], [place], [address] Phone: [phone]   Fax:  [phone]

## 2014-05-19 ENCOUNTER — Ambulatory Visit: Payer: Federal, State, Local not specified - PPO

## 2014-05-19 DIAGNOSIS — R29898 Other symptoms and signs involving the musculoskeletal system: Secondary | ICD-10-CM

## 2014-05-19 DIAGNOSIS — Z981 Arthrodesis status: Secondary | ICD-10-CM

## 2014-05-19 DIAGNOSIS — M25552 Pain in left hip: Secondary | ICD-10-CM | POA: Diagnosis not present

## 2014-05-19 DIAGNOSIS — R269 Unspecified abnormalities of gait and mobility: Secondary | ICD-10-CM

## 2014-05-19 NOTE — Therapy (Signed)
[facility] [address] [place], [place], [address] Phone: [phone]   Fax:  [phone]  Physical Therapy Treatment  Patient Details  Name: Natasha Frederick MRN: [id number] Date of Birth: [birth year] Referring Provider:  Kristian Covey, MD  Encounter Date: 05/19/2014      PT End of Session - 05/19/14 1200    Visit Number 29   Number of Visits 34   Date for PT Re-Evaluation 06/07/14   PT Start Time 1100   PT Stop Time 1150   PT Time Calculation (min) 50 min   Activity Tolerance Patient tolerated treatment well   Behavior During Therapy [facility] for tasks assessed/performed      Past Medical History  Diagnosis Date  . Endometriosis     Past Surgical History  Procedure Laterality Date  . Breast surgery  1987    bx  . Tonsillectomy  1958  . Oophorectomy  1977    left  . Appendectomy  1963  . Tubal ligation  1986  . Colonoscopy w/ polypectomy    . Planters wart Left 2005  . Anterior lat lumbar fusion Left 01/05/2014    Procedure: LUMBAR TWO TO THREE, LUMBAR LUMBAR THREE TO FOUR, ANTERIOR LATERAL LUMBAR FUSION 2 LEVELS;  Surgeon: Reinaldo Meeker, MD;  Location: [facility];  Service: Neurosurgery;  Laterality: Left;    There were no vitals taken for this visit.  Visit Diagnosis:  Weakness of left leg  Weakness of left hip  Abnormality of gait  S/P lumbar fusion  Pain in joint, pelvic region and thigh, left      Subjective Assessment - 05/19/14 1117    Symptoms No pain                    [facility] Adult PT Treatment/Exercise - 05/19/14 1159    Knee/Hip Exercises: Aerobic   Stationary Bike L3 11 min     The session was spent with activity on feet for balance and LT LE stabilization.  We stodd and bounce and move orange ball from side to side and carried ball 50 feet without LOB. She tried step ups at 4 inches and 6 inches but was  unable to do 6 inch step . She did much compensation to step up on 4  inch step with LT leg . Also did step to 2 inch step x 25 reps RT and LT . Also did sidestepping Lt and Rt and backward walking. She did TKE with back to wall. She needed much cuing to not compensate with other body parts. I contact guarded with belt the whole time. She reported some grabbing in LT hip with increased weight bearing on LT LE             PT Short Term Goals - 05/14/14 1201    PT SHORT TERM GOAL #1   Title Independent with inital HEP   Status Achieved   PT SHORT TERM GOAL #2   Title Improve LT quads stength to 4/5 to improve gait stability   Status Achieved   PT SHORT TERM GOAL #3   Title Lift Lt leg onto the bed without UE assist   Status Achieved   PT SHORT TERM GOAL #4   Title Reports pain improved to 5/10   Status Achieved           PT Long Term Goals - 05/14/14 1202    PT LONG TERM GOAL #1   Title Independent with  Advanced HEP   Status On-going   PT LONG TERM GOAL #2   Title Pt will report pain improved to 2/10 generally and 3-4/10 with walking   Status Achieved   PT LONG TERM GOAL #3   Title improve LT hip strength to 4-/5 or better to improve gait and independent with least restricted device.    Status On-going   PT LONG TERM GOAL #4   Title Return to light home tasks safely   Status Achieved   PT LONG TERM GOAL #5   Title Walk with unilateral device safely in home.    Time 6   Period Weeks   Additional Long Term Goals   Additional Long Term Goals Yes   PT LONG TERM GOAL #6   Title Safely walk with 15 pounds with unilateral device for transporting items in home   Time 6   Period Weeks   Status New   PT LONG TERM GOAL #7   Title She will report she is able to do laundry safely at machines but not carrying basket.    Time 6   Period Weeks   Status New               Plan - 05/19/14 1201    Clinical Impression Statement Stability on LT leg with weakness is limiting progress . Otherwise she is doing better walking without device  but is still unsteady and not safe and she does well with trunk stability an dmobility activity   PT Frequency 2x / week   PT Duration 2 weeks  starting next week   PT Next Visit Plan Review HEP, continue strength and stability and trunk mobility exercise.    Consulted and Agree with Plan of Care Patient        Problem List Patient Active Problem List   Diagnosis Date Noted  . Scoliosis of lumbar spine 01/05/2014    Caprice RedChasse, Stephen M PT 05/19/2014, 12:04 PM  [facility] [address] [place], [place], [address] Phone: [phone]   Fax:  [phone]

## 2014-05-21 ENCOUNTER — Ambulatory Visit: Payer: Federal, State, Local not specified - PPO

## 2014-05-21 DIAGNOSIS — R29898 Other symptoms and signs involving the musculoskeletal system: Secondary | ICD-10-CM

## 2014-05-21 DIAGNOSIS — M25552 Pain in left hip: Secondary | ICD-10-CM | POA: Diagnosis not present

## 2014-05-21 DIAGNOSIS — R269 Unspecified abnormalities of gait and mobility: Secondary | ICD-10-CM

## 2014-05-21 NOTE — Therapy (Signed)
[facility] [address] [place], [place], [address] Phone: [phone]   Fax:  [phone]  Physical Therapy Treatment  Patient Details  Name: Natasha Frederick MRN: [id number] Date of Birth: October 11, 1955 Referring Provider:  Kristian Covey, MD  Encounter Date: 05/21/2014      PT End of Session - 05/21/14 1140    Visit Number 30   Number of Visits 34   Date for PT Re-Evaluation 06/07/14   PT Start Time 1045   PT Stop Time 1145   PT Time Calculation (min) 60 min   Activity Tolerance Patient tolerated treatment well   Behavior During Therapy [facility] for tasks assessed/performed      Past Medical History  Diagnosis Date  . Endometriosis     Past Surgical History  Procedure Laterality Date  . Breast surgery  1987    bx  . Tonsillectomy  1958  . Oophorectomy  1977    left  . Appendectomy  1963  . Tubal ligation  1986  . Colonoscopy w/ polypectomy    . Planters wart Left 2005  . Anterior lat lumbar fusion Left 01/05/2014    Procedure: LUMBAR TWO TO THREE, LUMBAR LUMBAR THREE TO FOUR, ANTERIOR LATERAL LUMBAR FUSION 2 LEVELS;  Surgeon: Reinaldo Meeker, MD;  Location: [facility];  Service: Neurosurgery;  Laterality: Left;    There were no vitals taken for this visit.  Visit Diagnosis:  Weakness of left leg  Weakness of left hip  Abnormality of gait      Subjective Assessment - 05/21/14 1105    Symptoms No pain   Currently in Pain? No/denies   Multiple Pain Sites No                    [facility] Adult PT Treatment/Exercise - 05/21/14 1105    Knee/Hip Exercises: Aerobic   Stationary Bike L3 15 min   Knee/Hip Exercises: Supine   Heel Slides AROM;Left;1 set;15 reps   Bridges AROM;Both;1 set;20 reps  with adductor squeeze   Straight Leg Raises AROM;Left;1 set  12 reps   Straight Leg Raises Limitations with SAQ to initiate. she was able to clear leg 12 inches   Other Supine Knee Exercises lower trunk rotation  keeping ball between knees   Other Supine Knee Exercises sigle leg bridge x 12 RT and LT with non lift leg crossed over lift leg   Knee/Hip Exercises: Sidelying   Hip ABduction AROM;AAROM;Left;1 set;15 reps   Hip ADduction AROM;Left;1 set;15 reps  cued to control descent   Shoulder Exercises: Seated   Abduction Strengthening;Both;20 reps   Theraband Level (Shoulder ABduction) Level 3 (Green)   ABduction Limitations She did this with bridgeing x15                PT Education - 05/21/14 1140    Education Details single leg bridge   Person(s) Educated Patient   Methods Demonstration;Verbal cues;Handout;Explanation   Comprehension Returned demonstration;Verbalized understanding          PT Short Term Goals - 05/14/14 1201    PT SHORT TERM GOAL #1   Title Independent with inital HEP   Status Achieved   PT SHORT TERM GOAL #2   Title Improve LT quads stength to 4/5 to improve gait stability   Status Achieved   PT SHORT TERM GOAL #3   Title Lift Lt leg onto the bed without UE assist   Status Achieved   PT SHORT TERM GOAL #4  Title Reports pain improved to 5/10   Status Achieved           PT Long Term Goals - 05/21/14 1152    PT LONG TERM GOAL #1   Title Independent with Advanced HEP   Status On-going   PT LONG TERM GOAL #2   Title Pt will report pain improved to 2/10 generally and 3-4/10 with walking   Status Achieved   PT LONG TERM GOAL #3   Title improve LT hip strength to 4-/5 or better to improve gait and independent with least restricted device.    Status On-going   PT LONG TERM GOAL #4   Title Return to light home tasks safely   Status Achieved   PT LONG TERM GOAL #5   Title Walk with unilateral device safely in home.    Status On-going   PT LONG TERM GOAL #6   Title Safely walk with 15 pounds with unilateral device for transporting items in home   Status On-going   PT LONG TERM GOAL #7   Title She will report she is able to do laundry safely at  machines but not carrying basket.    Status On-going               Plan - 05/21/14 1150    Clinical Impression Statement She di well with bridging activity even single leg . She is still limited with active range with SLR and clam  She is able to hold wnen placed   PT Frequency 2x / week   PT Duration --  1 week   Consulted and Agree with Plan of Care Patient        Problem List Patient Active Problem List   Diagnosis Date Noted  . Scoliosis of lumbar spine 01/05/2014    Caprice RedChasse, Stephen M PT 05/21/2014, 11:54 AM  [facility] [address] [place], [place], [address] Phone: [phone]   Fax:  [phone]

## 2014-05-21 NOTE — Patient Instructions (Signed)
Trunk Extension: Bridge, Single Leg (Eccentric) Trunk Extension: Bridge, Single Leg (Eccentric)   Lie on back, feet on bed. Cross one knee on other. Lift hips slowly. Squeeze buttocks. Slowly lower hips for 3-5 seconds. 10-15___ reps per set, __1_ sets per day, __5-7_ days per week. Switch legs.  If you get a lot better can hold raised leg in air. .  Copyright  VHI. All rights reserved.  ved.

## 2014-05-24 ENCOUNTER — Ambulatory Visit: Payer: Federal, State, Local not specified - PPO

## 2014-05-25 ENCOUNTER — Ambulatory Visit: Payer: Federal, State, Local not specified - PPO | Admitting: Physical Therapy

## 2014-05-25 DIAGNOSIS — Z981 Arthrodesis status: Secondary | ICD-10-CM

## 2014-05-25 DIAGNOSIS — R269 Unspecified abnormalities of gait and mobility: Secondary | ICD-10-CM

## 2014-05-25 DIAGNOSIS — M25552 Pain in left hip: Secondary | ICD-10-CM

## 2014-05-25 DIAGNOSIS — R29898 Other symptoms and signs involving the musculoskeletal system: Secondary | ICD-10-CM

## 2014-05-25 NOTE — Therapy (Signed)
[facility] Outpatient Rehabilitation [facility] [address] [place], [place], [address] Phone: [phone]   Fax:  [phone]  Physical Therapy Treatment  Patient Details  Name: Natasha Frederick MRN: [id number] Date of Birth: 02-14-1956 Referring Provider:  Kristian Covey, MD  Encounter Date: 05/25/2014      PT End of Session - 05/25/14 1811    Visit Number 31   Number of Visits 34   Date for PT Re-Evaluation 06/07/14   PT Start Time 1145   PT Stop Time 1235   PT Time Calculation (min) 50 min   Activity Tolerance Patient tolerated treatment well   Behavior During Therapy [facility] for tasks assessed/performed      Past Medical History  Diagnosis Date  . Endometriosis     Past Surgical History  Procedure Laterality Date  . Breast surgery  1987    bx  . Tonsillectomy  1958  . Oophorectomy  1977    left  . Appendectomy  1963  . Tubal ligation  1986  . Colonoscopy w/ polypectomy    . Planters wart Left 2005  . Anterior lat lumbar fusion Left 01/05/2014    Procedure: LUMBAR TWO TO THREE, LUMBAR LUMBAR THREE TO FOUR, ANTERIOR LATERAL LUMBAR FUSION 2 LEVELS;  Surgeon: Reinaldo Meeker, MD;  Location: [facility];  Service: Neurosurgery;  Laterality: Left;    There were no vitals taken for this visit.  Visit Diagnosis:  No diagnosis found.      Subjective Assessment - 05/25/14 1155    Symptoms No pain except on left hip when pushes on it   Pertinent History She reported scoliosis over 3 months to in June or July 2015 she began to ahve to use a walker to walk due to pain and wweakness   Currently in Pain? Yes   Pain Score 2    Pain Location Hip   Pain Orientation Left   Pain Descriptors / Indicators Sore   Pain Type Chronic pain   Pain Onset More than a month ago                    [facility] Adult PT Treatment/Exercise - 05/25/14 1156    Lumbar Exercises: Supine   Bridge 10 reps  x 2 and UE to engage abdominals and VC for breathing   Other  Supine Lumbar Exercises PNF for left  LE   x 20 with PT assist flexion to extension and Red t band   Knee/Hip Exercises: Aerobic   Stationary Bike --   Knee/Hip Exercises: Supine   Heel Slides AROM;Left;1 set;10 reps   Bridges AROM;Both;1 set;20 reps  with adductor squeeze   Straight Leg Raises AROM;Left;1 set;10 reps   Straight Leg Raises Limitations --   Other Supine Knee Exercises SAQ Left with PT offering mild resistance   Other Supine Knee Exercises single leg bridge x 12 RT and LT with non lift leg crossed over lift leg   Knee/Hip Exercises: Sidelying   Hip ABduction AROM;AAROM;Left;1 set;15 reps   Hip ADduction AROM;Left;1 set;15 reps  VC for motor control   Shoulder Exercises: Seated   Abduction Strengthening;Both;20 reps   Theraband Level (Shoulder ABduction) Level 3 (Green)   ABduction Limitations She did this with bridgeing x15   Manual Therapy   Manual Therapy Other (comment)  trigger point muscle stimulation Left piriformis   Other Manual Therapy Pt given tennis ball for self trigger point stimulatin in home enviroment  PT Education - 05/25/14 1236    Education provided Yes   Education Details given verbal and written instructions for use of Red T band at home for abdominal engagement with exercise and a tennis ball for self myofascial techinique    Person(s) Educated Patient   Methods Explanation;Demonstration;Verbal cues;Handout   Comprehension Verbalized understanding;Returned demonstration;Verbal cues required;Tactile cues required          PT Short Term Goals - 05/25/14 1816    PT SHORT TERM GOAL #1   Title Independent with inital HEP   Time 4   Period Weeks   Status Achieved   PT SHORT TERM GOAL #2   Title Improve LT quads stength to 4/5 to improve gait stability   Time 4   Period Weeks   Status Achieved   PT SHORT TERM GOAL #3   Title Lift Lt leg onto the bed without UE assist   Time 4   Period Weeks   PT SHORT TERM GOAL  #4   Title Reports pain improved to 5/10   Time 4   Period Days   Status Achieved           PT Long Term Goals - 05/25/14 1816    PT LONG TERM GOAL #1   Title Independent with Advanced HEP   Time 12   Period Weeks   Status On-going   PT LONG TERM GOAL #2   Title Pt will report pain improved to 2/10 generally and 3-4/10 with walking   Time 12   Period Weeks   Status Achieved   PT LONG TERM GOAL #3   Title improve LT hip strength to 4-/5 or better to improve gait and independent with least restricted device.    Time 12   Period Weeks   Status On-going   PT LONG TERM GOAL #4   Title Return to light home tasks safely   Time 12   Period Weeks   Status Achieved   PT LONG TERM GOAL #5   Title Walk with unilateral device safely in home.    Time 6   Period Weeks   Status On-going   PT LONG TERM GOAL #6   Title Safely walk with 15 pounds with unilateral device for transporting items in home   Time 6   Period Weeks   Status On-going               Plan - 05/25/14 1812    Clinical Impression Statement Pt performed exercises with decreased motor control largely due to instability.  Pt continues to have limited active SLR.  Pt spent most of session with PNF and eccentric control of muscles.  Pt may benefit from  other  interventions deemed medically necessary by MD to increase stabiliyt   PT Next Visit Plan Continue strength and stability exercisees.  Assess goals         Problem List Patient Active Problem List   Diagnosis Date Noted  . Scoliosis of lumbar spine 01/05/2014    Garen LahLawrie Beardsley, PT 05/25/2014 6:19 PM Phone: [phone] Fax: [phone]  [facility] [address] [place], [place], [address] Phone: [phone]   Fax:  [phone]

## 2014-05-25 NOTE — Patient Instructions (Addendum)
Use tennis ball for trigger point relief of pain in Right hip ( piriformis.)  Hold ball in position and do deep breathing and then move tennis ball when sharp pain subsides.   Then move tennis ball to a new pain site.  Use red theraband over head when using marching exercise to engage abdomen when on back.  2 x 10 reps and remember to breathe

## 2014-05-26 ENCOUNTER — Ambulatory Visit: Payer: Federal, State, Local not specified - PPO

## 2014-05-26 DIAGNOSIS — R269 Unspecified abnormalities of gait and mobility: Secondary | ICD-10-CM

## 2014-05-26 DIAGNOSIS — M25552 Pain in left hip: Secondary | ICD-10-CM | POA: Diagnosis not present

## 2014-05-26 DIAGNOSIS — R29898 Other symptoms and signs involving the musculoskeletal system: Secondary | ICD-10-CM

## 2014-05-26 NOTE — Therapy (Signed)
[facility] Outpatient Rehabilitation [facility] [address] [place], [place], [address] Phone: [phone]   Fax:  [phone]  Physical Therapy Treatment  Patient Details  Name: Natasha Frederick MRN: [id number] Date of Birth: Aug 23, 1955 Referring Provider:  Kristian Covey, MD  Encounter Date: 05/26/2014      PT End of Session - 05/26/14 1218    Visit Number 32   Number of Visits 34   Date for PT Re-Evaluation 06/07/14   PT Start Time 1015   PT Stop Time 1100   PT Time Calculation (min) 45 min   Activity Tolerance Patient tolerated treatment well   Behavior During Therapy [facility] for tasks assessed/performed      Past Medical History  Diagnosis Date  . Endometriosis     Past Surgical History  Procedure Laterality Date  . Breast surgery  1987    bx  . Tonsillectomy  1958  . Oophorectomy  1977    left  . Appendectomy  1963  . Tubal ligation  1986  . Colonoscopy w/ polypectomy    . Planters wart Left 2005  . Anterior lat lumbar fusion Left 01/05/2014    Procedure: LUMBAR TWO TO THREE, LUMBAR LUMBAR THREE TO FOUR, ANTERIOR LATERAL LUMBAR FUSION 2 LEVELS;  Surgeon: Reinaldo Meeker, MD;  Location: [facility];  Service: Neurosurgery;  Laterality: Left;    There were no vitals taken for this visit.  Visit Diagnosis:  Weakness of left leg  Abnormality of gait  Weakness of left hip      Subjective Assessment - 05/26/14 1040    Symptoms no pain .  did not use ball.                     [facility] Adult PT Treatment/Exercise - 05/26/14 1019    Ambulation/Gait   Ambulation/Gait Yes   Ambulation/Gait Assistance 4: Min guard  gait/ stairs 4 inch step over and 6 inch step to with rails   Assistive device Straight cane   Lumbar Exercises: Supine   Ab Set --  Part sit up beginning 100's pilates 2x10   Bridge 20 reps   Other Supine Lumbar Exercises Lifting 3 pond weight with single leg hip lift and lift LT arm towrd ceiling with head andshoulder  lift for abdominals without ain    Knee/Hip Exercises: Aerobic   Stationary Bike L3 8 min   Knee/Hip Exercises: Standing   Other Standing Knee Exercises step ove 4 inc x 4 inch board with SPC and guarding, x10 RT and LT                PT Education - 05/25/14 1236    Education provided Yes   Education Details given verbal and written instructions for use of Red T band at home for abdominal engagement with exercise and a tennis ball for self myofascial techinique    Person(s) Educated Patient   Methods Explanation;Demonstration;Verbal cues;Handout   Comprehension Verbalized understanding;Returned demonstration;Verbal cues required;Tactile cues required          PT Short Term Goals - 05/25/14 1816    PT SHORT TERM GOAL #1   Title Independent with inital HEP   Time 4   Period Weeks   Status Achieved   PT SHORT TERM GOAL #2   Title Improve LT quads stength to 4/5 to improve gait stability   Time 4   Period Weeks   Status Achieved   PT SHORT TERM GOAL #3   Title  Lift Lt leg onto the bed without UE assist   Time 4   Period Weeks   PT SHORT TERM GOAL #4   Title Reports pain improved to 5/10   Time 4   Period Days   Status Achieved           PT Long Term Goals - 05/25/14 1816    PT LONG TERM GOAL #1   Title Independent with Advanced HEP   Time 12   Period Weeks   Status On-going   PT LONG TERM GOAL #2   Title Pt will report pain improved to 2/10 generally and 3-4/10 with walking   Time 12   Period Weeks   Status Achieved   PT LONG TERM GOAL #3   Title improve LT hip strength to 4-/5 or better to improve gait and independent with least restricted device.    Time 12   Period Weeks   Status On-going   PT LONG TERM GOAL #4   Title Return to light home tasks safely   Time 12   Period Weeks   Status Achieved   PT LONG TERM GOAL #5   Title Walk with unilateral device safely in home.    Time 6   Period Weeks   Status On-going   PT LONG TERM GOAL #6    Title Safely walk with 15 pounds with unilateral device for transporting items in home   Time 6   Period Weeks   Status On-going               Plan - 05/26/14 1219    Clinical Impression Statement She is tolerating back trunk exercise wihtout pain and good effort and response.  WEight to LT leg shows some improvment but when load increaes she is not able to support weight   PT Next Visit Plan Continue strength and stability exercisees.  Assess goals    Consulted and Agree with Plan of Care Patient        Problem List Patient Active Problem List   Diagnosis Date Noted  . Scoliosis of lumbar spine 01/05/2014    Caprice RedChasse, Stephen M PT 05/26/2014, 12:22 PM  [facility] [address] [place], [place], [address] Phone: [phone]   Fax:  [phone]

## 2014-06-01 ENCOUNTER — Ambulatory Visit: Payer: Federal, State, Local not specified - PPO

## 2014-06-01 DIAGNOSIS — R29898 Other symptoms and signs involving the musculoskeletal system: Secondary | ICD-10-CM

## 2014-06-01 DIAGNOSIS — M25552 Pain in left hip: Secondary | ICD-10-CM | POA: Diagnosis not present

## 2014-06-01 DIAGNOSIS — R269 Unspecified abnormalities of gait and mobility: Secondary | ICD-10-CM

## 2014-06-01 NOTE — Therapy (Signed)
[facility] [address] [place], [place], [address] Phone: [phone]   Fax:  [phone]  Physical Therapy Treatment  Patient Details  Name: Natasha BeckmannJudith Frederick MRN: [id number] Date of Birth: 03/13/1956 Referring Provider:  Kristian CoveyBurchette, Bruce W, MD  Encounter Date: 06/01/2014      PT End of Session - 06/01/14 1551    Visit Number 33   Number of Visits 34   Date for PT Re-Evaluation 06/07/14   PT Stop Time 1230      Past Medical History  Diagnosis Date  . Endometriosis     Past Surgical History  Procedure Laterality Date  . Breast surgery  1987    bx  . Tonsillectomy  1958  . Oophorectomy  1977    left  . Appendectomy  1963  . Tubal ligation  1986  . Colonoscopy w/ polypectomy    . Planters wart Left 2005  . Anterior lat lumbar fusion Left 01/05/2014    Procedure: LUMBAR TWO TO THREE, LUMBAR LUMBAR THREE TO FOUR, ANTERIOR LATERAL LUMBAR FUSION 2 LEVELS;  Surgeon: Reinaldo Meekerandy O Kritzer, MD;  Location: [facility];  Service: Neurosurgery;  Laterality: Left;    There were no vitals taken for this visit.  Visit Diagnosis:  Weakness of left leg  Abnormality of gait  Weakness of left hip      Subjective Assessment - 06/01/14 1143    Symptoms No pain . Tired. She is helping with light home tasks. Still needs walker   Currently in Pain? No/denies   Multiple Pain Sites No                    [facility] Adult PT Treatment/Exercise - 06/01/14 1145    Knee/Hip Exercises: Aerobic   Stationary Bike L3 15 min   Knee/Hip Exercises: Standing   Other Standing Knee Exercises Worked on placing dish into oven and best position and safest technique ( comng form LT side of oven appears  safe. also worked on getting items out of lowest drawer. It was better if she only had to reach into drawer 8-12 inches off floor for safety   Knee/Hip Exercises: Supine   Bridges AROM;Both;1 set;20 reps   Straight Leg Raises AROM;1 set;15 reps   Knee/Hip Exercises: Sidelying   Hip ABduction AROM;AAROM;Left;20 reps  worked with holdong and lifting from into parts of  range   Clams x15 RT and LT with asssit and hold in parts of range and lifting from theses spots.                   PT Short Term Goals - 05/25/14 1816    PT SHORT TERM GOAL #1   Title Independent with inital HEP   Time 4   Period Weeks   Status Achieved   PT SHORT TERM GOAL #2   Title Improve LT quads stength to 4/5 to improve gait stability   Time 4   Period Weeks   Status Achieved   PT SHORT TERM GOAL #3   Title Lift Lt leg onto the bed without UE assist   Time 4   Period Weeks   PT SHORT TERM GOAL #4   Title Reports pain improved to 5/10   Time 4   Period Days   Status Achieved           PT Long Term Goals - 05/25/14 1816    PT LONG TERM GOAL #1   Title Independent with Advanced HEP  Time 12   Period Weeks   Status On-going   PT LONG TERM GOAL #2   Title Pt will report pain improved to 2/10 generally and 3-4/10 with walking   Time 12   Period Weeks   Status Achieved   PT LONG TERM GOAL #3   Title improve LT hip strength to 4-/5 or better to improve gait and independent with least restricted device.    Time 12   Period Weeks   Status On-going   PT LONG TERM GOAL #4   Title Return to light home tasks safely   Time 12   Period Weeks   Status Achieved   PT LONG TERM GOAL #5   Title Walk with unilateral device safely in home.    Time 6   Period Weeks   Status On-going   PT LONG TERM GOAL #6   Title Safely walk with 15 pounds with unilateral device for transporting items in home   Time 6   Period Weeks   Status On-going               Plan - 06/01/14 1556    Clinical Impression Statement COntinues very weak in KT hip and knee. Back tolerating bending and twisting with functioanl activity. Safety and balance with activity appear primary coinern due to weakness and decr balance.    PT Next Visit Plan MEASURE TRUNK  RANGE AND LT LEG STRENGTH,     NOTE TO MD        Problem List Patient Active Problem List   Diagnosis Date Noted  . Scoliosis of lumbar spine 01/05/2014    Caprice Red PT 06/01/2014, 3:59 PM  [facility] [address] [place], [place], [address] Phone: [phone]   Fax:  [phone]

## 2014-06-03 ENCOUNTER — Ambulatory Visit: Payer: Federal, State, Local not specified - PPO

## 2014-06-03 DIAGNOSIS — R29898 Other symptoms and signs involving the musculoskeletal system: Secondary | ICD-10-CM

## 2014-06-03 DIAGNOSIS — M25552 Pain in left hip: Secondary | ICD-10-CM | POA: Diagnosis not present

## 2014-06-03 NOTE — Therapy (Signed)
[facility] [address] [place], [place], [address] Phone: [phone]   Fax:  [phone]  Physical Therapy Treatment  Patient Details  Name: Natasha BeckmannJudith Frederick MRN: [id number] Date of Birth: 02/02/1956 Referring Provider:  Kristian CoveyBurchette, Bruce W, MD  Encounter Date: 06/03/2014      PT End of Session - 06/03/14 1231    Visit Number 34   Number of Visits 44   Date for PT Re-Evaluation 07/08/14   PT Start Time 1130   PT Stop Time 1230   PT Time Calculation (min) 60 min   Activity Tolerance Patient tolerated treatment well   Behavior [facility] Therapy [facility] for tasks assessed/performed      Past Medical History  Diagnosis Date  . Endometriosis     Past Surgical History  Procedure Laterality Date  . Breast surgery  1987    bx  . Tonsillectomy  1958  . Oophorectomy  1977    left  . Appendectomy  1963  . Tubal ligation  1986  . Colonoscopy w/ polypectomy    . Planters wart Left 2005  . Anterior lat lumbar fusion Left 01/05/2014    Procedure: LUMBAR TWO TO THREE, LUMBAR LUMBAR THREE TO FOUR, ANTERIOR LATERAL LUMBAR FUSION 2 LEVELS;  Surgeon: Reinaldo Meekerandy O Kritzer, MD;  Location: [facility];  Service: Neurosurgery;  Laterality: Left;    There were no vitals taken for this visit.  Visit Diagnosis:  Weakness of left leg - Plan: PT plan of care cert/re-cert  Weakness of left hip - Plan: PT plan of care cert/re-cert      Subjective Assessment - 06/03/14 1145    Symptoms No pain . Took medication.  Without medication she has pain 2/10 in lower back or LT hip.   Limitations Walking;Standing   Currently in Pain? No/denies   Multiple Pain Sites No          [facility] PT Assessment - 06/03/14 1156    ROM / Strength   AROM / PROM / Strength AROM   AROM   Overall AROM Comments Trunk motion she is able to bend to 90 degrees, extend to 20 degrees, sidebending  10-13 degrees  and rotation 30 degrees RT and LT   Strength   Left Hip Flexion 3+/5    Left Hip Extension 2+/5   Left Hip External Rotation  --  4/5   Left Hip Internal Rotation  --  3+/5   Left Hip ABduction 2+/5   Left Hip ADduction 4+/5   Bed Mobility   Supine to Sit Details (indicate cue type and reason) She still lifts LT leg up and down to mat thiough she dan lift LT leg with effort without UE assist   Transfers   Sit to Stand 6: Modified independent (Device/Increase time)  Need UE assist of walker to stand form  chair   Stand to Sit 6: Modified independent (Device/Increase time)  defenite need of UE to sit   Ambulation/Gait   Ambulation/Gait Yes   Ambulation/Gait Assistance 6: Modified independent (Device/Increase time)   Ambulation Distance (Feet) 500 Feet   Assistive device Rolling walker   Gait Pattern Step-through pattern;Decreased weight shift to left;Left genu recurvatum;Lateral hip instability;Decreased trunk rotation   Ambulation Surface Level;Indoor   Gait Comments She is able to step up on 4 inch step indepndently and walk 4 inch steps with one rail but lifts body by plantarflexing RT foot so LT leg is straight for RT foot step to next step/. She  is able to walk with [facility] with contact guard on level surface but with decr stability on LT leg   Balance   Balance Assessed --  she was unable to stand on LT leg without UE support                  [facility] Adult PT Treatment/Exercise - 06/03/14 1156    Lumbar Exercises: Supine   Clam 15 reps   Bent Knee Raise 15 reps   Bridge 20 reps   Straight Leg Raise 10 reps;2 seconds                PT Education - 06/03/14 1231    Education provided Yes   Education Details reviewed results of assessment   Person(s) Educated Patient   Methods Explanation   Comprehension Verbalized understanding          PT Short Term Goals - 06/03/14 1234    PT SHORT TERM GOAL #1   Title Independent with inital HEP   Status Achieved   PT SHORT TERM GOAL #2   Title Improve LT quads stength to 4/5 to  improve gait stability   Status Achieved   PT SHORT TERM GOAL #3   Title Lift Lt leg onto the bed without UE assist   Baseline --  She does not do this consistently   Status Achieved   PT SHORT TERM GOAL #4   Title Reports pain improved to 5/10   Status Achieved           PT Long Term Goals - 06/03/14 1235    PT LONG TERM GOAL #1   Title Independent with Advanced HEP   Status On-going   PT LONG TERM GOAL #2   Title Pt will report pain improved to 2/10 generally and 3-4/10 with walking   Status Achieved   PT LONG TERM GOAL #3   Title improve LT hip strength to 4-/5 or better to improve gait and independent with least restricted device.    Status On-going   PT LONG TERM GOAL #4   Title Return to light home tasks safely   Status Achieved   PT LONG TERM GOAL #5   Title Walk with unilateral device safely in home.    Status On-going   PT LONG TERM GOAL #6   Title Safely walk with 15 pounds with unilateral device for transporting items in home   Status On-going   PT LONG TERM GOAL #7   Title She will report she is able to do laundry safely at machines but not carrying basket.    Status On-going               Plan - 06/03/14 1232    Clinical Impression Statement She does not measure improved from last assessment in strenght and unable to stand on Lt leg without UE support.    PT Frequency 2x / week   PT Duration --  5 weeks   PT Next Visit Plan Continue strengthening and gait/mobility   Consulted and Agree with Plan of Care Patient        Problem List Patient Active Problem List   Diagnosis Date Noted  . Scoliosis of lumbar spine 01/05/2014    Caprice Red PT 06/03/2014, 12:37 PM  [facility] [address] [place], [place], [address] Phone: [phone]   Fax:  [phone]

## 2014-06-08 ENCOUNTER — Ambulatory Visit: Payer: Federal, State, Local not specified - PPO | Attending: Neurosurgery

## 2014-06-08 DIAGNOSIS — R269 Unspecified abnormalities of gait and mobility: Secondary | ICD-10-CM | POA: Insufficient documentation

## 2014-06-08 DIAGNOSIS — R531 Weakness: Secondary | ICD-10-CM | POA: Diagnosis not present

## 2014-06-08 DIAGNOSIS — M79652 Pain in left thigh: Secondary | ICD-10-CM | POA: Insufficient documentation

## 2014-06-08 DIAGNOSIS — R29898 Other symptoms and signs involving the musculoskeletal system: Secondary | ICD-10-CM

## 2014-06-08 DIAGNOSIS — Z5189 Encounter for other specified aftercare: Secondary | ICD-10-CM | POA: Diagnosis not present

## 2014-06-08 DIAGNOSIS — M25552 Pain in left hip: Secondary | ICD-10-CM | POA: Insufficient documentation

## 2014-06-08 DIAGNOSIS — M419 Scoliosis, unspecified: Secondary | ICD-10-CM | POA: Diagnosis not present

## 2014-06-08 NOTE — Therapy (Signed)
[facility] [address] [place], [place], [address] Phone: [phone]   Fax:  [phone]  Physical Therapy Treatment  Patient Details  Name: Natasha Frederick MRN: [id number] Date of Birth: 20-Jan-1956 Referring Provider:  Kristian Covey, MD  Encounter Date: 06/08/2014      PT End of Session - 06/08/14 1233    Visit Number 35   Number of Visits 44   Date for PT Re-Evaluation 07/08/14   PT Start Time 1040   PT Stop Time 1145   PT Time Calculation (min) 65 min   Activity Tolerance Patient tolerated treatment well   Behavior During Therapy [facility] for tasks assessed/performed      Past Medical History  Diagnosis Date  . Endometriosis     Past Surgical History  Procedure Laterality Date  . Breast surgery  1987    bx  . Tonsillectomy  1958  . Oophorectomy  1977    left  . Appendectomy  1963  . Tubal ligation  1986  . Colonoscopy w/ polypectomy    . Planters wart Left 2005  . Anterior lat lumbar fusion Left 01/05/2014    Procedure: LUMBAR TWO TO THREE, LUMBAR LUMBAR THREE TO FOUR, ANTERIOR LATERAL LUMBAR FUSION 2 LEVELS;  Surgeon: Reinaldo Meeker, MD;  Location: [facility];  Service: Neurosurgery;  Laterality: Left;    There were no vitals taken for this visit.  Visit Diagnosis:  Weakness of left leg  Weakness of left hip  Abnormality of gait                  [facility] Adult PT Treatment/Exercise - 06/08/14 1149    Transfers   Sit to Stand 5: Supervision  x10 with better control with cues to bend at the hips to sit   Stand to Sit 5: Supervision   Ambulation/Gait   Ambulation/Gait Yes   Ambulation/Gait Assistance 4: Min guard   Ambulation Distance (Feet) 300 Feet   Assistive device Bilateral platform walker;Straight cane   Gait Pattern Decreased step length - left;Decreased step length - right;Decreased stance time - left;Decreased hip/knee flexion - left;Decreased weight shift to left;Left genu  recurvatum;Trendelenburg   Lumbar Exercises: Supine   Bridge 20 reps   Bridge Limitations also did bridging with knee extension over ball, and with feet on ball She was cued and did fairly well to control the ball and not allow the ball to drift to RT. Marland Kitchen Also did ball pulls with LE flexion x 15   Other Supine Lumbar Exercises lower trunk rotation legs on orange ball   Knee/Hip Exercises: Aerobic   Stationary Bike L3 15 min   Knee/Hip Exercises: Standing   Other Standing Knee Exercises step ove 4 inc x 4 inch board with SPC and guarding, x10 RT and LT   Knee/Hip Exercises: Supine   Bridges AROM;Both;1 set;20 reps                PT Education - 06/08/14 1233    Education provided Yes   Education Details sit to stand technique   Person(s) Educated Patient   Methods Explanation;Demonstration;Tactile cues;Verbal cues   Comprehension Returned demonstration          PT Short Term Goals - 06/03/14 1234    PT SHORT TERM GOAL #1   Title Independent with inital HEP   Status Achieved   PT SHORT TERM GOAL #2   Title Improve LT quads stength to 4/5 to improve gait stability  Status Achieved   PT SHORT TERM GOAL #3   Title Lift Lt leg onto the bed without UE assist   Baseline --  She does not do this consistently   Status Achieved   PT SHORT TERM GOAL #4   Title Reports pain improved to 5/10   Status Achieved           PT Long Term Goals - 06/03/14 1235    PT LONG TERM GOAL #1   Title Independent with Advanced HEP   Status On-going   PT LONG TERM GOAL #2   Title Pt will report pain improved to 2/10 generally and 3-4/10 with walking   Status Achieved   PT LONG TERM GOAL #3   Title improve LT hip strength to 4-/5 or better to improve gait and independent with least restricted device.    Status On-going   PT LONG TERM GOAL #4   Title Return to light home tasks safely   Status Achieved   PT LONG TERM GOAL #5   Title Walk with unilateral device safely in home.     Status On-going   PT LONG TERM GOAL #6   Title Safely walk with 15 pounds with unilateral device for transporting items in home   Status On-going   PT LONG TERM GOAL #7   Title She will report she is able to do laundry safely at machines but not carrying basket.    Status On-going               Plan - 06/08/14 1234    Clinical Impression Statement MD will send me to orthopedic.    PT Next Visit Plan Sit to stand technique, LE strength   Consulted and Agree with Plan of Care Patient        Problem List Patient Active Problem List   Diagnosis Date Noted  . Scoliosis of lumbar spine 01/05/2014    Caprice RedChasse, Stephen M PT 06/08/2014, 12:35 PM  [facility] [address] [place], [place], [address] Phone: [phone]   Fax:  [phone]

## 2014-06-10 ENCOUNTER — Ambulatory Visit: Payer: Federal, State, Local not specified - PPO

## 2014-06-10 DIAGNOSIS — M25552 Pain in left hip: Secondary | ICD-10-CM | POA: Diagnosis not present

## 2014-06-10 DIAGNOSIS — R29898 Other symptoms and signs involving the musculoskeletal system: Secondary | ICD-10-CM

## 2014-06-10 NOTE — Therapy (Signed)
[facility] Outpatient Rehabilitation [facility] [address] [place], [place], [address] Phone: [phone]   Fax:  [phone]  Physical Therapy Treatment  Patient Details  Name: Natasha Frederick MRN: [id number] Date of Birth: 30-Sep-1955 Referring Provider:  Kristian Covey, MD  Encounter Date: 06/10/2014      PT End of Session - 06/10/14 1218    Visit Number 36   Date for PT Re-Evaluation 07/08/14   PT Start Time 1100   PT Stop Time 1220   PT Time Calculation (min) 80 min   Activity Tolerance Patient tolerated treatment well   Behavior During Therapy [facility] for tasks assessed/performed      Past Medical History  Diagnosis Date  . Endometriosis     Past Surgical History  Procedure Laterality Date  . Breast surgery  1987    bx  . Tonsillectomy  1958  . Oophorectomy  1977    left  . Appendectomy  1963  . Tubal ligation  1986  . Colonoscopy w/ polypectomy    . Planters wart Left 2005  . Anterior lat lumbar fusion Left 01/05/2014    Procedure: LUMBAR TWO TO THREE, LUMBAR LUMBAR THREE TO FOUR, ANTERIOR LATERAL LUMBAR FUSION 2 LEVELS;  Surgeon: Reinaldo Meeker, MD;  Location: [facility];  Service: Neurosurgery;  Laterality: Left;    There were no vitals taken for this visit.  Visit Diagnosis:  Weakness of left leg  Weakness of left hip      Subjective Assessment - 06/10/14 1101    Symptoms No pain   Currently in Pain? No/denies                    [facility] Adult PT Treatment/Exercise - 06/10/14 1102    Ambulation/Gait   Ambulation Distance (Feet) 300 Feet   Assistive device Straight cane   Gait Pattern Step-through pattern   Knee/Hip Exercises: Aerobic   Stationary Bike L3 15 min   Knee/Hip Exercises: Machines for Strengthening   Total Gym Leg Press Nustep L6 x 12 reps L4 35 reps , L6 15 reps, L7  2x12   Knee/Hip Exercises: Seated   Long Arc Quad Strengthening;Right;Left;1 set  30 reps 5 pounds   Long Arc Quad Weight 5 lbs.    Knee/Hip Exercises: Supine   Bridges AROM;20 reps   Knee Extension AROM;Both;1 set;15 reps   Knee Extension Limitations with legs on orage ball   Knee/Hip Exercises: Sidelying   Hip ABduction Left;1 set;AAROM;15 reps   Hip ABduction Limitations with addition of extension    Hip ADduction AROM;Left;1 set;15 reps   Clams x 20   Knee/Hip Exercises: Prone   Hamstring Curl 15 reps   Hamstring Curl Limitations poor control of lower leg in space    Other Prone Exercises hip rotation control with tactile and vvebal cues and hold in IR and ER   Other Prone Exercises Wrked on contro;l of hamstring x 15 with slower pace and stops trying to not flex past 90 degrees                  PT Short Term Goals - 06/10/14 1220    PT SHORT TERM GOAL #1   Title Independent with inital HEP   Status Achieved   PT SHORT TERM GOAL #2   Title Improve LT quads stength to 4/5 to improve gait stability   Status Achieved   PT SHORT TERM GOAL #3   Title Lift Lt leg onto the bed without  UE assist   Status Achieved           PT Long Term Goals - 06/10/14 1221    PT LONG TERM GOAL #1   Title Independent with Advanced HEP   Status On-going   PT LONG TERM GOAL #2   Title Pt will report pain improved to 2/10 generally and 3-4/10 with walking   Status Achieved   PT LONG TERM GOAL #3   Title improve LT hip strength to 4-/5 or better to improve gait and independent with least restricted device.    Status On-going   PT LONG TERM GOAL #4   Title Return to light home tasks safely   Status Achieved   PT LONG TERM GOAL #5   Title Walk with unilateral device safely in home.    Status On-going   PT LONG TERM GOAL #6   Title Safely walk with 15 pounds with unilateral device for transporting items in home   Status On-going   PT LONG TERM GOAL #7   Title She will report she is able to do laundry safely at machines but not carrying basket.    Status On-going               Plan - 06/10/14 1219     Clinical Impression Statement She wa able to push with left leg on nustep better doing level seven. Still poor hip and knee control     PT Next Visit Plan Continue strength/control and sit to stand review   Consulted and Agree with Plan of Care Patient        Problem List Patient Active Problem List   Diagnosis Date Noted  . Scoliosis of lumbar spine 01/05/2014    Caprice RedChasse, Stephen M PT 06/10/2014, 12:30 PM  [facility] [address] [place], [place], [address] Phone: [phone]   Fax:  [phone]

## 2014-06-15 ENCOUNTER — Ambulatory Visit: Payer: Federal, State, Local not specified - PPO

## 2014-06-15 DIAGNOSIS — R269 Unspecified abnormalities of gait and mobility: Secondary | ICD-10-CM

## 2014-06-15 DIAGNOSIS — M25552 Pain in left hip: Secondary | ICD-10-CM | POA: Diagnosis not present

## 2014-06-15 DIAGNOSIS — R29898 Other symptoms and signs involving the musculoskeletal system: Secondary | ICD-10-CM

## 2014-06-15 NOTE — Therapy (Signed)
[facility] [address] [place], [place], [address] Phone: [phone]   Fax:  [phone]  Physical Therapy Treatment  Patient Details  Name: Natasha BeckmannJudith Frederick MRN: [id number] Date of Birth: 04/15/1955 Referring Provider:  Kristian CoveyBurchette, Bruce W, MD  Encounter Date: 06/15/2014      PT End of Session - 06/15/14 1103    Visit Number 37   Number of Visits 44   Date for PT Re-Evaluation 07/08/14   PT Start Time 1015   PT Stop Time 1120   PT Time Calculation (min) 65 min   Activity Tolerance Patient tolerated treatment well   Behavior During Therapy [facility] for tasks assessed/performed      Past Medical History  Diagnosis Date  . Endometriosis     Past Surgical History  Procedure Laterality Date  . Breast surgery  1987    bx  . Tonsillectomy  1958  . Oophorectomy  1977    left  . Appendectomy  1963  . Tubal ligation  1986  . Colonoscopy w/ polypectomy    . Planters wart Left 2005  . Anterior lat lumbar fusion Left 01/05/2014    Procedure: LUMBAR TWO TO THREE, LUMBAR LUMBAR THREE TO FOUR, ANTERIOR LATERAL LUMBAR FUSION 2 LEVELS;  Surgeon: Reinaldo Meekerandy O Kritzer, MD;  Location: [facility];  Service: Neurosurgery;  Laterality: Left;    There were no vitals taken for this visit.  Visit Diagnosis:  Weakness of left leg  Abnormality of gait  Weakness of left hip      Subjective Assessment - 06/15/14 1019    Symptoms No pain.     Currently in Pain? No/denies          [facility] - 06/15/14 1024    AROM   Overall AROM Comments Trunk flexion with hip flexion to 90 degrees , lumbar flexion to 50 degrees , sidebending to 25 degrees RT and LT and extension to 15-20 degrees                  [facility] Adult PT Treatment/Exercise - 06/15/14 1019    Transfers   Sit to Stand --  She did this from mat plus 2 inch pad, mat only nd regular c   Ambulation/Gait   Gait Comments worked at stepping LT and RT ove r 5 inch high 4  inch wide board x 8 RT and LT and walking with one leg on slope one on level 20 feet x 3 each   Lumbar Exercises: Supine   Other Supine Lumbar Exercises Trunk rotation  short range with hips at 90 degrees and knees fully extended. Close supervisioon/guard. also did abdominal set with bilateral knee extension (feet on large bolster x12 and RT leg alone with +1 guard min assist . Significant crepitus noted.    Knee/Hip Exercises: Aerobic   Stationary Bike L3 6 min, then 15 min at end of session ,                   PT Short Term Goals - 06/10/14 1220    PT SHORT TERM GOAL #1   Title Independent with inital HEP   Status Achieved   PT SHORT TERM GOAL #2   Title Improve LT quads stength to 4/5 to improve gait stability   Status Achieved   PT SHORT TERM GOAL #3   Title Lift Lt leg onto the bed without UE assist   Status Achieved  PT Long Term Goals - 06/10/14 1221    PT LONG TERM GOAL #1   Title Independent with Advanced HEP   Status On-going   PT LONG TERM GOAL #2   Title Pt will report pain improved to 2/10 generally and 3-4/10 with walking   Status Achieved   PT LONG TERM GOAL #3   Title improve LT hip strength to 4-/5 or better to improve gait and independent with least restricted device.    Status On-going   PT LONG TERM GOAL #4   Title Return to light home tasks safely   Status Achieved   PT LONG TERM GOAL #5   Title Walk with unilateral device safely in home.    Status On-going   PT LONG TERM GOAL #6   Title Safely walk with 15 pounds with unilateral device for transporting items in home   Status On-going   PT LONG TERM GOAL #7   Title She will report she is able to do laundry safely at machines but not carrying basket.    Status On-going               Plan - 06/15/14 1104    Clinical Impression Statement Much improved with sit to stand  as sh is able to do independently ( though she compensates for stability and should not do without close  supervision from normal chair). significant crepitus LT knee   PT Next Visit Plan Continue strength and functional mobility   PT Home Exercise Plan Nee to review all HEP   Consulted and Agree with Plan of Care Patient        Problem List Patient Active Problem List   Diagnosis Date Noted  . Scoliosis of lumbar spine 01/05/2014    Caprice Red PT 06/15/2014, 11:09 AM  [facility] [address] [place], [place], [address] Phone: [phone]   Fax:  [phone]

## 2014-06-17 ENCOUNTER — Ambulatory Visit: Payer: Federal, State, Local not specified - PPO

## 2014-06-22 ENCOUNTER — Ambulatory Visit: Payer: Federal, State, Local not specified - PPO

## 2014-06-22 DIAGNOSIS — M25552 Pain in left hip: Secondary | ICD-10-CM | POA: Diagnosis not present

## 2014-06-22 DIAGNOSIS — R29898 Other symptoms and signs involving the musculoskeletal system: Secondary | ICD-10-CM

## 2014-06-22 NOTE — Therapy (Signed)
[facility] [address] [place], [place], [address] Phone: [phone]   Fax:  [phone]  Physical Therapy Treatment  Patient Details  Name: Natasha BeckmannJudith Frederick MRN: [id number] Date of Birth: 07/27/1955 Referring Provider:  Kristian CoveyBurchette, Bruce W, MD  Encounter Date: 06/22/2014      PT End of Session - 06/22/14 1146    Visit Number 38   Number of Visits 44   Date for PT Re-Evaluation 07/08/14   PT Start Time 1050   PT Stop Time 1143   PT Time Calculation (min) 53 min   Activity Tolerance Patient tolerated treatment well   Behavior During Therapy [facility] for tasks assessed/performed      Past Medical History  Diagnosis Date  . Endometriosis     Past Surgical History  Procedure Laterality Date  . Breast surgery  1987    bx  . Tonsillectomy  1958  . Oophorectomy  1977    left  . Appendectomy  1963  . Tubal ligation  1986  . Colonoscopy w/ polypectomy    . Planters wart Left 2005  . Anterior lat lumbar fusion Left 01/05/2014    Procedure: LUMBAR TWO TO THREE, LUMBAR LUMBAR THREE TO FOUR, ANTERIOR LATERAL LUMBAR FUSION 2 LEVELS;  Surgeon: Reinaldo Meekerandy O Kritzer, MD;  Location: [facility];  Service: Neurosurgery;  Laterality: Left;    There were no vitals filed for this visit.  Visit Diagnosis:  Weakness of left leg      Subjective Assessment - 06/22/14 1109    Symptoms MD said LT hip need THA Knee looked better than hip so no surgery at knee Will have surgery on 07/19/14   Currently in Pain? No/denies   Multiple Pain Sites No                       [facility] Adult PT Treatment/Exercise - 06/22/14 1113    Knee/Hip Exercises: Aerobic   Stationary Bike L3 6 min, then 15 min at end of session ,    Knee/Hip Exercises: Supine   Bridges AROM  25 reps.    Other Supine Knee Exercises clamsheel control LT knee /hip   Other Supine Knee Exercises assited abduction x 25 with resistance as able    Knee/Hip Exercises: Sidelying   Hip ADduction AROM;Left;1 set  25 reps   Knee/Hip Exercises: Prone   Hamstring Curl 20 reps   Other Prone Exercises hip rotation control with tactile and vvebal cues and hold in IR and ER   Transfers   Sit to Stand Details Verbal cues for sequencing   Stand to Sit Details (indicate cue type and reason) Tactile cues for sequencing;Verbal cues for sequencing                PT Education - 06/22/14 1145    Education provided Yes   Education Details discussed posible scenarios with post THA rehab.    Person(s) Educated Patient;Spouse   Methods Explanation   Comprehension Verbalized understanding          PT Short Term Goals - 06/10/14 1220    PT SHORT TERM GOAL #1   Title Independent with inital HEP   Status Achieved   PT SHORT TERM GOAL #2   Title Improve LT quads stength to 4/5 to improve gait stability   Status Achieved   PT SHORT TERM GOAL #3   Title Lift Lt leg onto the bed without UE assist   Status Achieved  PT Long Term Goals - 06/10/14 1221    PT LONG TERM GOAL #1   Title Independent with Advanced HEP   Status On-going   PT LONG TERM GOAL #2   Title Pt will report pain improved to 2/10 generally and 3-4/10 with walking   Status Achieved   PT LONG TERM GOAL #3   Title improve LT hip strength to 4-/5 or better to improve gait and independent with least restricted device.    Status On-going   PT LONG TERM GOAL #4   Title Return to light home tasks safely   Status Achieved   PT LONG TERM GOAL #5   Title Walk with unilateral device safely in home.    Status On-going   PT LONG TERM GOAL #6   Title Safely walk with 15 pounds with unilateral device for transporting items in home   Status On-going   PT LONG TERM GOAL #7   Title She will report she is able to do laundry safely at machines but not carrying basket.    Status On-going               Plan - 06/22/14 1301    PT Home Exercise Plan Need to review all HEP        Problem  List Patient Active Problem List   Diagnosis Date Noted  . Scoliosis of lumbar spine 01/05/2014    Caprice Red PT 06/22/2014, 1:02 PM  [facility] [address] [place], [place], [address] Phone: [phone]   Fax:  [phone]

## 2014-06-23 ENCOUNTER — Ambulatory Visit (INDEPENDENT_AMBULATORY_CARE_PROVIDER_SITE_OTHER): Payer: Federal, State, Local not specified - PPO | Admitting: Family Medicine

## 2014-06-23 ENCOUNTER — Encounter: Payer: Self-pay | Admitting: Family Medicine

## 2014-06-23 VITALS — BP 130/80 | HR 72 | Temp 97.6°F | Wt 141.0 lb

## 2014-06-23 DIAGNOSIS — J069 Acute upper respiratory infection, unspecified: Secondary | ICD-10-CM

## 2014-06-23 MED ORDER — AZITHROMYCIN 250 MG PO TABS: ORAL_TABLET | ORAL | Status: AC

## 2014-06-23 NOTE — Progress Notes (Signed)
Pre visit review using our clinic review tool, if applicable. No additional management support is needed unless otherwise documented below in the visit note. 

## 2014-06-23 NOTE — Progress Notes (Signed)
   Subjective:    Patient ID: Natasha Frederick, female    DOB: 11/09/1955, 59 y.o.   MRN: [id number]  HPI Patient seen with symptoms of cough, sinus pressure, head congestion, mild headache for the past 4 or 5 days. She's been taking Robitussin-DM which helps her cough. She had L3-L4 lumbar fusion September 29. She's had lengthy recovery with extensive rehabilitation. She is still having difficulties ambulating and was diagnosed with severe osteoarthritis left hip and now has total left hip replacement scheduled for April 11. She is concerned because of her current illness and does not wish to delay her surgery. No recent fever. Allergy to penicillin and sulfa.  Past Medical History  Diagnosis Date  . Endometriosis    Past Surgical History  Procedure Laterality Date  . Breast surgery  1987    bx  . Tonsillectomy  1958  . Oophorectomy  1977    left  . Appendectomy  1963  . Tubal ligation  1986  . Colonoscopy w/ polypectomy    . Planters wart Left 2005  . Anterior lat lumbar fusion Left 01/05/2014    Procedure: LUMBAR TWO TO THREE, LUMBAR LUMBAR THREE TO FOUR, ANTERIOR LATERAL LUMBAR FUSION 2 LEVELS;  Surgeon: Reinaldo Meekerandy O Kritzer, MD;  Location: [facility];  Service: Neurosurgery;  Laterality: Left;    reports that she quit smoking about 20 years ago. Her smoking use included Cigarettes. She has a 8 pack-year smoking history. She does not have any smokeless tobacco history on file. She reports that she drinks about 3.6 oz of alcohol per week. She reports that she does not use illicit drugs. family history is negative for Arthritis, Cancer, and Heart disease. Allergies  Allergen Reactions  . Bee Venom Anaphylaxis and Swelling  . Penicillins Hives  . Sulfa Antibiotics Hives      Review of Systems  Constitutional: Negative for fever and chills.  HENT: Positive for congestion.   Respiratory: Positive for cough.   Neurological: Positive for headaches.       Objective:   Physical Exam    Constitutional: She appears well-developed and well-nourished. No distress.  HENT:  Right Ear: External ear normal.  Left Ear: External ear normal.  Mouth/Throat: Oropharynx is clear and moist.  Neck: Neck supple.  Minimal anterior cervical adenopathy  Cardiovascular: Normal rate and regular rhythm.   Pulmonary/Chest: Effort normal and breath sounds normal. No respiratory distress. She has no wheezes. She has no rales.          Assessment & Plan:  Acute upper respiratory infection. Suspect viral. We have not recommended antibiotics at this time but if she has any progressive or persistent facial pain and pressure -or purulent secretions start Zithromax. Continue over-the-counter Robitussin-DM as needed for cough.

## 2014-06-24 ENCOUNTER — Ambulatory Visit: Payer: Federal, State, Local not specified - PPO

## 2014-06-24 DIAGNOSIS — R29898 Other symptoms and signs involving the musculoskeletal system: Secondary | ICD-10-CM

## 2014-06-24 DIAGNOSIS — M25552 Pain in left hip: Secondary | ICD-10-CM | POA: Diagnosis not present

## 2014-06-24 NOTE — Therapy (Signed)
[facility], [place], [address] Phone: [phone]   Fax:  [phone]  Physical Therapy Treatment  Patient Details  Name: Natasha Frederick MRN: [id number] Date of Birth: 10-Oct-1955 Referring Provider:  Eulas Post, MD  Encounter Date: 06/24/2014      PT End of Session - 06/24/14 1142    Visit Number 39   Number of Visits 44   Date for PT Re-Evaluation 07/08/14   PT Start Time 1100   PT Stop Time 1140   PT Time Calculation (min) 40 min   Activity Tolerance Patient tolerated treatment well   Behavior During Therapy [facility] for tasks assessed/performed      Past Medical History  Diagnosis Date  . Endometriosis     Past Surgical History  Procedure Laterality Date  . Breast surgery  1987    bx  . Tonsillectomy  1958  . Oophorectomy  1977    left  . Appendectomy  1963  . Tubal ligation  1986  . Colonoscopy w/ polypectomy    . Planters wart Left 2005  . Anterior lat lumbar fusion Left 01/05/2014    Procedure: LUMBAR TWO TO THREE, LUMBAR LUMBAR THREE TO FOUR, ANTERIOR LATERAL LUMBAR FUSION 2 LEVELS;  Surgeon: Faythe Ghee, MD;  Location: [place] [facility];  Service: Neurosurgery;  Laterality: Left;    There were no vitals filed for this visit.  Visit Diagnosis:  Weakness of left leg      Subjective Assessment - 06/24/14 1105    Symptoms Not lookong foreward to surgery but knows it may help walking /stability   Currently in Pain? Yes   Pain Score 2    Pain Location --  hip and less in knee   Pain Orientation Left   Pain Descriptors / Indicators Sore   Pain Type Chronic pain   Pain Onset More than a month ago   Pain Frequency Intermittent   Aggravating Factors  exercise stretching , weight bearing   Pain Relieving Factors medication and rest   Effect of Pain on Daily Activities minimal limitation from pain, mor from weakness   Multiple Pain Sites No            [facility] PT Assessment -  06/24/14 0001    Strength   Right Hip Extension 4-/5   Right Hip ABduction 4-/5   Right Hip ADduction 5/5   Left Hip Flexion 4-/5   Left Hip Extension 2+/5   Left Hip External Rotation  --  3/5 may be in part related to joint issues    Left Hip Internal Rotation  --  2/5   Left Hip ABduction 3-/5   Left Hip ADduction 4+/5   Right Knee Flexion 5/5   Left Knee Flexion 4+/5   Left Knee Extension 4-/5  crepitus noted in knee   Transfers   Transfers Sit to Stand;Stand to Sit   Sit to Stand 6: Modified independent (Device/Increase time)   Stand to Sit 6: Modified independent (Device/Increase time)   Ambulation/Gait   Assistive device Rolling walker   Gait Pattern Step-through pattern;Decreased stance time - left;Decreased hip/knee flexion - left;Decreased weight shift to left;Left genu recurvatum;Lateral hip instability   Stairs Yes   Stair Management Technique Two rails;Step to pattern   Height of Stairs 6                             PT Short Term  Goals - Jul 08, 2014 1143    PT SHORT TERM GOAL #1   Title Independent with inital HEP   Status Achieved   PT SHORT TERM GOAL #2   Title Improve LT quads stength to 4/5 to improve gait stability   Status Achieved   PT SHORT TERM GOAL #3   Title Lift Lt leg onto the bed without UE assist   Status Achieved   PT SHORT TERM GOAL #4   Title Reports pain improved to 5/10           PT Long Term Goals - 07/08/2014 1144    PT LONG TERM GOAL #1   Title Independent with Advanced HEP   Status Achieved   PT LONG TERM GOAL #2   Title Pt will report pain improved to 2/10 generally and 3-4/10 with walking   Status Achieved   PT LONG TERM GOAL #3   Title improve LT hip strength to 4-/5 or better to improve gait and independent with least restricted device.    Status Not Met   PT LONG TERM GOAL #4   Title Return to light home tasks safely   Status Achieved   PT LONG TERM GOAL #5   Title Walk with unilateral device  safely in home.    Status Not Met   PT LONG TERM GOAL #6   Title Safely walk with 15 pounds with unilateral device for transporting items in home   Status Not Met   PT LONG TERM GOAL #7   Title She will report she is able to do laundry safely at machines but not carrying basket.    Status Not Met               Plan - July 08, 2014 1143    Clinical Impression Statement She has improved strenght and functional mobility over course of PT but is in need of THA so is discharged with HEP   Consulted and Agree with Plan of Care Patient          G-Codes - 2014-07-08 1144    Functional Assessment Tool Used clinical judgement   Functional Limitation Mobility: Walking and moving around   Mobility: Walking and Moving Around Goal Status [phone]) At least 40 percent but less than 60 percent impaired, limited or restricted   Mobility: Walking and Moving Around Discharge Status [phone]) At least 60 percent but less than 80 percent impaired, limited or restricted      Problem List Patient Active Problem List   Diagnosis Date Noted  . Scoliosis of lumbar spine 01/05/2014    Darrel Hoover PT 08-Jul-2014, 11:45 AM  [facility] [address] [place], [place], [address] Phone: [phone]   Fax:  [phone]   PHYSICAL THERAPY DISCHARGE SUMMARY  Visits from Start of Care: 3  Current functional level related to goals / functional outcomes: See above  Remaining deficits: LE weakness, gait difficulty   Education / Equipment: HEP  Plan: Patient agrees to discharge.  Patient goals were partially met. Patient is being discharged due to a change in medical status.  ?????

## 2014-06-24 NOTE — Patient Instructions (Signed)
i asked her to continue her HEP and to give them to HHPT to assess which exercises were appropriate for her post THA

## 2014-06-29 ENCOUNTER — Other Ambulatory Visit: Payer: Self-pay | Admitting: Orthopedic Surgery

## 2014-06-29 ENCOUNTER — Other Ambulatory Visit: Payer: Self-pay | Admitting: Family Medicine

## 2014-06-30 ENCOUNTER — Other Ambulatory Visit: Payer: Self-pay | Admitting: Family Medicine

## 2014-06-30 NOTE — Telephone Encounter (Signed)
What she took previously (ie the Zithromax) is still in her system for 10 days and cannot refill yet.  If she is no better 2 weeks after taking the Zithromax would look at another antibiotic altogether.

## 2014-06-30 NOTE — Telephone Encounter (Signed)
Patient informed. 

## 2014-06-30 NOTE — Telephone Encounter (Signed)
Left a message at the pts husband's cell number to return my call-no answer at the pts home number.

## 2014-07-05 ENCOUNTER — Ambulatory Visit: Payer: Federal, State, Local not specified - PPO

## 2014-07-08 NOTE — Pre-Procedure Instructions (Signed)
Lumi Winslett  07/08/2014   Your procedure is scheduled on: Monday, April 11.  Report to [facility] Admitting at 10:30AM.   Call this number if you have problems the morning of surgery: [phone]               For any other questions, please call [phone], Monday - Friday 8 AM - 4 PM.   Remember:   Do not eat food or drink liquids after midnight Sunday, April 10.   Take these medicines the morning of surgery with A SIP OF WATER: methocarbamol (ROBAXIN).                 Take if needed: acetaminophen (TYLENOL)        Do not wear jewelry, make-up or nail polish.   Do not wear lotions, powders, or perfumes.    Do not shave 48 hours prior to surgery.    Do not bring valuables to the hospital.                [facility] is not responsible for any belongings or valuables.               Contacts, dentures or bridgework may not be worn into surgery.   Leave suitcase in the car. After surgery it may be brought to your room.   For patients admitted to the hospital, discharge time is determined by your treatment team.                Special Instructions: Special Instructions: [place] - Preparing for Surgery  Before surgery, you can play an important role.  Because skin is not sterile, your skin needs to be as free of germs as possible.  You can reduce the number of germs on you skin by washing with CHG (chlorahexidine gluconate) soap before surgery.  CHG is an antiseptic cleaner which kills germs and bonds with the skin to continue killing germs even after washing.  Please DO NOT use if you have an allergy to CHG or antibacterial soaps.  If your skin becomes reddened/irritated stop using the CHG and inform your nurse when you arrive at Short Stay.  Do not shave (including legs and underarms) for at least 48 hours prior to the first CHG shower.  You may shave your face.  Please follow these instructions carefully:   1.  Shower with CHG Soap the night before surgery  and the  morning of Surgery.  2.  If you choose to wash your hair, wash your hair first as usual with your  normal shampoo.  3.  After you shampoo, rinse your hair and body thoroughly to remove the  Shampoo.  4.  Use CHG as you would any other liquid soap.  You can apply chg directly to the skin and wash gently with scrungie or a clean washcloth.  5.  Apply the CHG Soap to your body ONLY FROM THE NECK DOWN.    Do not use on open wounds or open sores.  Avoid contact with your eyes, ears, mouth and genitals (private parts).  Wash genitals (private parts)   with your normal soap.  6.  Wash thoroughly, paying special attention to the area where your surgery will be performed.  7.  Thoroughly rinse your body with warm water from the neck down.  8.  DO NOT shower/wash with your normal soap after using and rinsing off   the CHG Soap.  9.  Pat yourself dry with  a clean towel.            10.  Wear clean pajamas.            11.  Place clean sheets on your bed the night of your first shower and do not sleep with pets.  Day of Surgery  Do not apply any lotions/deodorants the morning of surgery.  Please wear clean clothes to the hospital/surgery center.   Please read over the following fact sheets that you were given: Pain Booklet, Coughing and Deep Breathing, Blood Transfusion Information and Surgical Site Infection Prevention And Incentive Spirometry

## 2014-07-09 ENCOUNTER — Encounter (HOSPITAL_COMMUNITY)
Admission: RE | Admit: 2014-07-09 | Discharge: 2014-07-09 | Disposition: A | Discharge status: Home / Self Care | Payer: Medicare Other | Source: Ambulatory Visit | Attending: Orthopedic Surgery | Admitting: Orthopedic Surgery

## 2014-07-09 ENCOUNTER — Encounter (HOSPITAL_COMMUNITY): Payer: Self-pay

## 2014-07-09 DIAGNOSIS — Z01810 Encounter for preprocedural cardiovascular examination: Secondary | ICD-10-CM | POA: Insufficient documentation

## 2014-07-09 DIAGNOSIS — Z01818 Encounter for other preprocedural examination: Secondary | ICD-10-CM | POA: Diagnosis not present

## 2014-07-09 DIAGNOSIS — Z01812 Encounter for preprocedural laboratory examination: Secondary | ICD-10-CM | POA: Diagnosis present

## 2014-07-09 DIAGNOSIS — Z01811 Encounter for preprocedural respiratory examination: Secondary | ICD-10-CM | POA: Diagnosis not present

## 2014-07-09 HISTORY — DX: Unspecified osteoarthritis, unspecified site: M19.90

## 2014-07-09 LAB — BASIC METABOLIC PANEL
Anion gap: 9 (ref 5–15)
BUN: 21 mg/dL (ref 6–23)
CO2: 22 mmol/L (ref 19–32)
Calcium: 9.9 mg/dL (ref 8.4–10.5)
Chloride: 110 mmol/L (ref 96–112)
Creatinine, Ser: 0.68 mg/dL (ref 0.50–1.10)
GFR calc Af Amer: >90 mL/min (ref >90)
GFR calc non Af Amer: >90 mL/min (ref >90)
Glucose, Bld: 98 mg/dL (ref 70–99)
Potassium: 4.2 mmol/L (ref 3.5–5.1)
Sodium: 141 mmol/L (ref 135–145)

## 2014-07-09 LAB — URINALYSIS, ROUTINE W REFLEX MICROSCOPIC
Bilirubin Urine: NEGATIVE
Glucose, UA: NEGATIVE mg/dL
Hgb urine dipstick: NEGATIVE
Ketones, ur: NEGATIVE mg/dL
Leukocytes, UA: NEGATIVE
Nitrite: NEGATIVE
Protein, ur: NEGATIVE mg/dL
Specific Gravity, Urine: 1.015 (ref 1.005–1.030)
Urobilinogen, UA: 0.2 mg/dL (ref 0.0–1.0)
pH: 5.5 (ref 5.0–8.0)

## 2014-07-09 LAB — PROTIME-INR
INR: 0.93 (ref 0.00–1.49)
Prothrombin Time: 12.5 seconds (ref 11.6–15.2)

## 2014-07-09 LAB — CBC WITH DIFFERENTIAL/PLATELET
Basophils Absolute: 0 10*3/uL (ref 0.0–0.1)
Basophils Relative: 1 % (ref 0–1)
Eosinophils Absolute: 0.2 10*3/uL (ref 0.0–0.7)
Eosinophils Relative: 2 % (ref 0–5)
HCT: 37.7 % (ref 36.0–46.0)
Hemoglobin: 12.1 g/dL (ref 12.0–15.0)
Lymphocytes Relative: 13 % (ref 12–46)
Lymphs Abs: 1.1 10*3/uL (ref 0.7–4.0)
MCH: 28.7 pg (ref 26.0–34.0)
MCHC: 32.1 g/dL (ref 30.0–36.0)
MCV: 89.5 fL (ref 78.0–100.0)
Monocytes Absolute: 0.5 10*3/uL (ref 0.1–1.0)
Monocytes Relative: 6 % (ref 3–12)
Neutro Abs: 6.5 10*3/uL (ref 1.7–7.7)
Neutrophils Relative %: 78 % — ABNORMAL HIGH (ref 43–77)
Platelets: 298 10*3/uL (ref 150–400)
RBC: 4.21 MIL/uL (ref 3.87–5.11)
RDW: 13.8 % (ref 11.5–15.5)
WBC: 8.2 10*3/uL (ref 4.0–10.5)

## 2014-07-09 LAB — SURGICAL PCR SCREEN
MRSA, PCR: NEGATIVE
Staphylococcus aureus: NEGATIVE

## 2014-07-09 LAB — APTT: aPTT: 29 seconds (ref 24–37)

## 2014-07-09 LAB — TYPE AND SCREEN
ABO/RH(D): A POS
Antibody Screen: NEGATIVE

## 2014-07-16 NOTE — H&P (Signed)
TOTAL HIP ADMISSION H&P  Patient is admitted for left total hip arthroplasty.  Subjective:  Chief Complaint: left hip pain  HPI: Natasha Frederick, 59 y.o. female, has a history of pain and functional disability in the left hip(s) due to arthritis and patient has failed non-surgical conservative treatments for greater than 12 weeks to include NSAID's and/or analgesics, use of assistive devices and activity modification.  Onset of symptoms was gradual starting several years ago with gradually worsening course since that time.The patient noted no past surgery on the left hip(s).  Patient currently rates pain in the left hip at 10 out of 10 with activity. Patient has night pain, worsening of pain with activity and weight bearing, trendelenberg gait, pain that interfers with activities of daily living, pain with passive range of motion and crepitus. Patient has evidence of periarticular osteophytes and joint space narrowing by imaging studies. This condition presents safety issues increasing the risk of falls.   There is no current active infection.  Patient Active Problem List   Diagnosis Date Noted  . Scoliosis of lumbar spine 01/05/2014   Past Medical History  Diagnosis Date  . Endometriosis   . Arthritis     hip    Past Surgical History  Procedure Laterality Date  . Tonsillectomy  1958  . Oophorectomy  1977    left  . Appendectomy  1963  . Tubal ligation  1986  . Colonoscopy w/ polypectomy    . Planters wart Left 2005  . Anterior lat lumbar fusion Left 01/05/2014    Procedure: LUMBAR TWO TO THREE, LUMBAR LUMBAR THREE TO FOUR, ANTERIOR LATERAL LUMBAR FUSION 2 LEVELS;  Surgeon: Reinaldo Meekerandy O Kritzer, MD;  Location: [facility];  Service: Neurosurgery;  Laterality: Left;  . Radial keratoto Bilateral   . Breast surgery Left 1987    bx  . Eye surgery      No prescriptions prior to admission   Allergies  Allergen Reactions  . Bee Venom Anaphylaxis and Swelling  . Adhesive [Tape] Other (See  Comments)    redness  . Penicillins Hives  . Sulfa Antibiotics Hives    History  Substance Use Topics  . Smoking status: Former Smoker -- 1.00 packs/day for 8 years    Types: Cigarettes    Quit date: 01/17/1994  . Smokeless tobacco: Not on file  . Alcohol Use: 3.6 oz/week    6 Cans of beer per week    Family History  Problem Relation Age of Onset  . Arthritis Neg Hx     family hx  . Cancer Neg Hx     prostate ca -family hx  . Heart disease Neg Hx     family hx     Review of Systems  Constitutional: Negative.   HENT:       Sinus problems  Eyes: Negative.   Respiratory: Negative.   Cardiovascular: Negative.   Gastrointestinal: Negative.   Genitourinary: Negative.   Musculoskeletal: Positive for joint pain.  Skin: Negative.   Neurological: Negative.   Endo/Heme/Allergies: Negative.   Psychiatric/Behavioral: Negative.     Objective:  Physical Exam  Constitutional: She is oriented to person, place, and time. She appears well-developed and well-nourished.  HENT:  Head: Normocephalic and atraumatic.  Eyes: Pupils are equal, round, and reactive to light.  Neck: Normal range of motion. Neck supple.  Cardiovascular: Intact distal pulses.   Respiratory: Effort normal.  Musculoskeletal: She exhibits tenderness.  She can Internally rotate to 20, but causes severe discomfort external  rotation is 30.  Foot tap is one plus positive.  The skin over her hip is intact she is neurovascularly intact distally.    Neurological: She is alert and oriented to person, place, and time.  Skin: Skin is warm and dry.  Psychiatric: She has a normal mood and affect. Her behavior is normal. Judgment and thought content normal.    Vital signs in last 24 hours:    Labs:   Estimated body mass index is 24.05 kg/(m^2) as calculated from the following:   Height as of 01/05/14:  (1.651 m).   Weight as of 12/29/13: 65.545 kg (144 lb 8 oz).   Imaging Review Plain radiographs  demonstrate complete collapse of 50% of her superolateral femoral head and now has progressive degenerative change of the acetabulum.    Assessment/Plan:  End stage arthritis, left hip(s)  The patient history, physical examination, clinical judgement of the provider and imaging studies are consistent with end stage degenerative joint disease of the left hip(s) and total hip arthroplasty is deemed medically necessary. The treatment options including medical management, injection therapy, arthroscopy and arthroplasty were discussed at length. The risks and benefits of total hip arthroplasty were presented and reviewed. The risks due to aseptic loosening, infection, stiffness, dislocation/subluxation,  thromboembolic complications and other imponderables were discussed.  The patient acknowledged the explanation, agreed to proceed with the plan and consent was signed. Patient is being admitted for inpatient treatment for surgery, pain control, PT, OT, prophylactic antibiotics, VTE prophylaxis, progressive ambulation and ADL's and discharge planning.The patient is planning to be discharged home with home health services

## 2014-07-16 NOTE — Progress Notes (Addendum)
LEFT MESSAGE FOR PATIENT TO ARRIVE 07/19/14 900 AM.   1035 pt returned call and will arrive at 900 am.

## 2014-07-18 MED ORDER — VANCOMYCIN HCL IN DEXTROSE 1-5 GM/200ML-% IV SOLN
1000.0000 mg | INTRAVENOUS | Status: AC
  Administered 2014-07-19 (×2): 1000 mg via INTRAVENOUS
  Filled 2014-07-18: qty 200

## 2014-07-18 MED ORDER — TRANEXAMIC ACID 100 MG/ML IV SOLN
1000.0000 mg | INTRAVENOUS | Status: DC
  Filled 2014-07-18: qty 10

## 2014-07-19 ENCOUNTER — Inpatient Hospital Stay (HOSPITAL_COMMUNITY): Payer: Medicare Other

## 2014-07-19 ENCOUNTER — Inpatient Hospital Stay (HOSPITAL_COMMUNITY): Payer: Medicare Other | Admitting: Certified Registered Nurse Anesthetist

## 2014-07-19 ENCOUNTER — Encounter (HOSPITAL_COMMUNITY)
Admission: RE | Disposition: A | Discharge status: Skilled Nursing Facility | Payer: Self-pay | Source: Ambulatory Visit | Attending: Orthopedic Surgery

## 2014-07-19 ENCOUNTER — Encounter (HOSPITAL_COMMUNITY): Payer: Self-pay | Admitting: Surgery

## 2014-07-19 ENCOUNTER — Inpatient Hospital Stay (HOSPITAL_COMMUNITY)
Admission: RE | Admit: 2014-07-19 | Discharge: 2014-07-22 | DRG: 470 | Disposition: A | Discharge status: Skilled Nursing Facility | Payer: Medicare Other | Source: Ambulatory Visit | Attending: Orthopedic Surgery | Admitting: Orthopedic Surgery

## 2014-07-19 DIAGNOSIS — M25552 Pain in left hip: Secondary | ICD-10-CM | POA: Diagnosis present

## 2014-07-19 DIAGNOSIS — M419 Scoliosis, unspecified: Secondary | ICD-10-CM | POA: Diagnosis present

## 2014-07-19 DIAGNOSIS — D62 Acute posthemorrhagic anemia: Secondary | ICD-10-CM | POA: Diagnosis not present

## 2014-07-19 DIAGNOSIS — M1612 Unilateral primary osteoarthritis, left hip: Secondary | ICD-10-CM | POA: Diagnosis present

## 2014-07-19 DIAGNOSIS — Z87891 Personal history of nicotine dependence: Secondary | ICD-10-CM | POA: Diagnosis not present

## 2014-07-19 DIAGNOSIS — Z96642 Presence of left artificial hip joint: Secondary | ICD-10-CM

## 2014-07-19 HISTORY — DX: Scoliosis, unspecified: M41.9

## 2014-07-19 HISTORY — PX: TOTAL HIP ARTHROPLASTY: SHX124

## 2014-07-19 SURGERY — ARTHROPLASTY, HIP, TOTAL,POSTERIOR APPROACH
Anesthesia: General | Laterality: Left

## 2014-07-19 MED ORDER — GLYCOPYRROLATE 0.2 MG/ML IJ SOLN
INTRAMUSCULAR | Status: DC | PRN
  Administered 2014-07-19: 0.4 mg via INTRAVENOUS

## 2014-07-19 MED ORDER — METOCLOPRAMIDE HCL 5 MG/ML IJ SOLN: 5.0000 mg | Freq: Three times a day (TID) | INTRAMUSCULAR | Status: DC | PRN

## 2014-07-19 MED ORDER — CHLORHEXIDINE GLUCONATE 4 % EX LIQD
60.0000 mL | Freq: Once | CUTANEOUS | Status: DC
  Filled 2014-07-19: qty 60

## 2014-07-19 MED ORDER — MIDAZOLAM HCL 5 MG/5ML IJ SOLN
INTRAMUSCULAR | Status: DC | PRN
  Administered 2014-07-19: 2 mg via INTRAVENOUS

## 2014-07-19 MED ORDER — ONDANSETRON HCL 4 MG/2ML IJ SOLN
INTRAMUSCULAR | Status: DC | PRN
  Administered 2014-07-19: 4 mg via INTRAVENOUS

## 2014-07-19 MED ORDER — HYDROMORPHONE HCL 1 MG/ML IJ SOLN
INTRAMUSCULAR | Status: AC
  Filled 2014-07-19: qty 1

## 2014-07-19 MED ORDER — ONDANSETRON HCL 4 MG PO TABS
4.0000 mg | ORAL_TABLET | Freq: Four times a day (QID) | ORAL | Status: DC | PRN
  Administered 2014-07-20: 4 mg via ORAL
  Filled 2014-07-19: qty 1

## 2014-07-19 MED ORDER — LIDOCAINE HCL (CARDIAC) 20 MG/ML IV SOLN
INTRAVENOUS | Status: DC | PRN
  Administered 2014-07-19: 50 mg via INTRAVENOUS

## 2014-07-19 MED ORDER — EPHEDRINE SULFATE 50 MG/ML IJ SOLN
INTRAMUSCULAR | Status: DC | PRN
  Administered 2014-07-19: 5 mg via INTRAVENOUS

## 2014-07-19 MED ORDER — DEXTROSE-NACL 5-0.45 % IV SOLN: INTRAVENOUS | Status: DC

## 2014-07-19 MED ORDER — HYDROMORPHONE HCL 1 MG/ML IJ SOLN
0.2500 mg | INTRAMUSCULAR | Status: DC | PRN
  Administered 2014-07-19: 0.5 mg via INTRAVENOUS
  Administered 2014-07-19 (×3): 0.25 mg via INTRAVENOUS
  Administered 2014-07-19: 0.5 mg via INTRAVENOUS
  Administered 2014-07-19: 0.25 mg via INTRAVENOUS

## 2014-07-19 MED ORDER — ROCURONIUM BROMIDE 100 MG/10ML IV SOLN
INTRAVENOUS | Status: DC | PRN
  Administered 2014-07-19: 40 mg via INTRAVENOUS

## 2014-07-19 MED ORDER — MENTHOL 3 MG MT LOZG: 1.0000 | LOZENGE | OROMUCOSAL | Status: DC | PRN

## 2014-07-19 MED ORDER — BUPIVACAINE LIPOSOME 1.3 % IJ SUSP
20.0000 mL | Freq: Once | INTRAMUSCULAR | Status: DC
  Filled 2014-07-19: qty 20

## 2014-07-19 MED ORDER — BUPIVACAINE-EPINEPHRINE 0.5% -1:200000 IJ SOLN
INTRAMUSCULAR | Status: DC | PRN
  Administered 2014-07-19: 17 mL

## 2014-07-19 MED ORDER — ACETAMINOPHEN 325 MG PO TABS: 650.0000 mg | ORAL_TABLET | Freq: Four times a day (QID) | ORAL | Status: DC | PRN

## 2014-07-19 MED ORDER — KCL IN DEXTROSE-NACL 20-5-0.45 MEQ/L-%-% IV SOLN
INTRAVENOUS | Status: AC
  Filled 2014-07-19: qty 1000

## 2014-07-19 MED ORDER — 0.9 % SODIUM CHLORIDE (POUR BTL) OPTIME
TOPICAL | Status: DC | PRN
  Administered 2014-07-19: 1000 mL

## 2014-07-19 MED ORDER — HYDROCODONE-ACETAMINOPHEN 5-325 MG PO TABS: 1.0000 | ORAL_TABLET | Freq: Four times a day (QID) | ORAL | Status: DC | PRN

## 2014-07-19 MED ORDER — METHOCARBAMOL 500 MG PO TABS: 500.0000 mg | ORAL_TABLET | Freq: Two times a day (BID) | ORAL | Status: DC

## 2014-07-19 MED ORDER — PHENOL 1.4 % MT LIQD: 1.0000 | OROMUCOSAL | Status: DC | PRN

## 2014-07-19 MED ORDER — BUPIVACAINE-EPINEPHRINE (PF) 0.5% -1:200000 IJ SOLN
INTRAMUSCULAR | Status: AC
  Filled 2014-07-19: qty 30

## 2014-07-19 MED ORDER — DIPHENHYDRAMINE HCL 12.5 MG/5ML PO ELIX: 12.5000 mg | ORAL_SOLUTION | ORAL | Status: DC | PRN

## 2014-07-19 MED ORDER — ACETAMINOPHEN 650 MG RE SUPP: 650.0000 mg | Freq: Four times a day (QID) | RECTAL | Status: DC | PRN

## 2014-07-19 MED ORDER — METHOCARBAMOL 1000 MG/10ML IJ SOLN
500.0000 mg | Freq: Four times a day (QID) | INTRAVENOUS | Status: DC | PRN
  Administered 2014-07-19: 500 mg via INTRAVENOUS
  Filled 2014-07-19 (×2): qty 5

## 2014-07-19 MED ORDER — METHOCARBAMOL 1000 MG/10ML IJ SOLN
500.0000 mg | INTRAVENOUS | Status: AC
  Administered 2014-07-19: 500 mg via INTRAVENOUS
  Filled 2014-07-19: qty 5

## 2014-07-19 MED ORDER — METHOCARBAMOL 500 MG PO TABS
500.0000 mg | ORAL_TABLET | Freq: Four times a day (QID) | ORAL | Status: DC | PRN
  Administered 2014-07-19 – 2014-07-22 (×9): 500 mg via ORAL
  Filled 2014-07-19 (×10): qty 1

## 2014-07-19 MED ORDER — FENTANYL CITRATE 0.05 MG/ML IJ SOLN
INTRAMUSCULAR | Status: DC | PRN
  Administered 2014-07-19 (×2): 50 ug via INTRAVENOUS
  Administered 2014-07-19: 100 ug via INTRAVENOUS
  Administered 2014-07-19: 50 ug via INTRAVENOUS

## 2014-07-19 MED ORDER — KCL IN DEXTROSE-NACL 20-5-0.45 MEQ/L-%-% IV SOLN
INTRAVENOUS | Status: DC
  Administered 2014-07-19 – 2014-07-20 (×2): via INTRAVENOUS
  Filled 2014-07-19 (×11): qty 1000

## 2014-07-19 MED ORDER — TRANEXAMIC ACID 100 MG/ML IV SOLN
1000.0000 mg | INTRAVENOUS | Status: AC
  Administered 2014-07-19: 1000 mg via INTRAVENOUS
  Filled 2014-07-19: qty 10

## 2014-07-19 MED ORDER — DOCUSATE SODIUM 100 MG PO CAPS
100.0000 mg | ORAL_CAPSULE | Freq: Two times a day (BID) | ORAL | Status: DC
  Administered 2014-07-20 – 2014-07-22 (×3): 100 mg via ORAL
  Filled 2014-07-19 (×5): qty 1

## 2014-07-19 MED ORDER — MIDAZOLAM HCL 2 MG/2ML IJ SOLN
INTRAMUSCULAR | Status: AC
  Filled 2014-07-19: qty 2

## 2014-07-19 MED ORDER — PROMETHAZINE HCL 25 MG/ML IJ SOLN: 6.2500 mg | INTRAMUSCULAR | Status: DC | PRN

## 2014-07-19 MED ORDER — FLEET ENEMA 7-19 GM/118ML RE ENEM: 1.0000 | ENEMA | Freq: Once | RECTAL | Status: AC | PRN

## 2014-07-19 MED ORDER — SENNOSIDES-DOCUSATE SODIUM 8.6-50 MG PO TABS
1.0000 | ORAL_TABLET | Freq: Every evening | ORAL | Status: DC | PRN
  Administered 2014-07-20 – 2014-07-21 (×2): 1 via ORAL
  Filled 2014-07-19 (×2): qty 1

## 2014-07-19 MED ORDER — HYDROCODONE-ACETAMINOPHEN 10-325 MG PO TABS
1.0000 | ORAL_TABLET | ORAL | Status: DC | PRN
  Administered 2014-07-19: 1 via ORAL
  Administered 2014-07-20 – 2014-07-22 (×12): 2 via ORAL
  Filled 2014-07-19 (×7): qty 2
  Filled 2014-07-19: qty 1
  Filled 2014-07-19 (×5): qty 2

## 2014-07-19 MED ORDER — ASPIRIN EC 325 MG PO TBEC: 325.0000 mg | DELAYED_RELEASE_TABLET | Freq: Two times a day (BID) | ORAL | Status: DC

## 2014-07-19 MED ORDER — NEOSTIGMINE METHYLSULFATE 10 MG/10ML IV SOLN
INTRAVENOUS | Status: DC | PRN
  Administered 2014-07-19: 3 mg via INTRAVENOUS

## 2014-07-19 MED ORDER — FENTANYL CITRATE 0.05 MG/ML IJ SOLN
INTRAMUSCULAR | Status: AC
  Filled 2014-07-19: qty 5

## 2014-07-19 MED ORDER — PHENYLEPHRINE HCL 10 MG/ML IJ SOLN
10.0000 mg | INTRAVENOUS | Status: DC | PRN
  Administered 2014-07-19: 15 ug/min via INTRAVENOUS

## 2014-07-19 MED ORDER — METOCLOPRAMIDE HCL 5 MG PO TABS: 5.0000 mg | ORAL_TABLET | Freq: Three times a day (TID) | ORAL | Status: DC | PRN

## 2014-07-19 MED ORDER — PROPOFOL 10 MG/ML IV BOLUS
INTRAVENOUS | Status: DC | PRN
  Administered 2014-07-19: 30 mg via INTRAVENOUS
  Administered 2014-07-19: 170 mg via INTRAVENOUS

## 2014-07-19 MED ORDER — LACTATED RINGERS IV SOLN
INTRAVENOUS | Status: DC
  Administered 2014-07-19: 11:00:00 via INTRAVENOUS

## 2014-07-19 MED ORDER — ONDANSETRON HCL 4 MG/2ML IJ SOLN: 4.0000 mg | Freq: Four times a day (QID) | INTRAMUSCULAR | Status: DC | PRN

## 2014-07-19 MED ORDER — PHENYLEPHRINE HCL 10 MG/ML IJ SOLN
INTRAMUSCULAR | Status: DC | PRN
  Administered 2014-07-19: 80 ug via INTRAVENOUS
  Administered 2014-07-19: 120 ug via INTRAVENOUS
  Administered 2014-07-19: 40 ug via INTRAVENOUS
  Administered 2014-07-19: 120 ug via INTRAVENOUS
  Administered 2014-07-19: 80 ug via INTRAVENOUS

## 2014-07-19 MED ORDER — BISACODYL 5 MG PO TBEC: 5.0000 mg | DELAYED_RELEASE_TABLET | Freq: Every day | ORAL | Status: DC | PRN

## 2014-07-19 MED ORDER — GLYCOPYRROLATE 0.2 MG/ML IJ SOLN
INTRAMUSCULAR | Status: AC
  Filled 2014-07-19: qty 2

## 2014-07-19 MED ORDER — HYDROMORPHONE HCL 1 MG/ML IJ SOLN
1.0000 mg | INTRAMUSCULAR | Status: DC | PRN
  Administered 2014-07-19 – 2014-07-20 (×4): 1 mg via INTRAVENOUS
  Filled 2014-07-19 (×4): qty 1

## 2014-07-19 MED ORDER — ASPIRIN EC 325 MG PO TBEC
325.0000 mg | DELAYED_RELEASE_TABLET | Freq: Every day | ORAL | Status: DC
  Administered 2014-07-20 – 2014-07-22 (×3): 325 mg via ORAL
  Filled 2014-07-19 (×3): qty 1

## 2014-07-19 MED ORDER — NEOSTIGMINE METHYLSULFATE 10 MG/10ML IV SOLN
INTRAVENOUS | Status: AC
  Filled 2014-07-19: qty 1

## 2014-07-19 SURGICAL SUPPLY — 53 items
BLADE SAW SGTL 18X1.27X75 (BLADE) ×2 IMPLANT
BRUSH FEMORAL CANAL (MISCELLANEOUS) IMPLANT
CAPT HIP TOTAL 2 ×1 IMPLANT
COVER BACK TABLE 24X17X13 BIG (DRAPES) IMPLANT
COVER SURGICAL LIGHT HANDLE (MISCELLANEOUS) ×3 IMPLANT
DRAPE IMP U-DRAPE 54X76 (DRAPES) ×2 IMPLANT
DRAPE ORTHO SPLIT 77X108 STRL (DRAPES) ×2
DRAPE PROXIMA HALF (DRAPES) ×2 IMPLANT
DRAPE SURG ORHT 6 SPLT 77X108 (DRAPES) ×1 IMPLANT
DRAPE U-SHAPE 47X51 STRL (DRAPES) ×2 IMPLANT
DRILL BIT 7/64X5 (BIT) ×2 IMPLANT
DRSG AQUACEL AG ADV 3.5X10 (GAUZE/BANDAGES/DRESSINGS) ×2 IMPLANT
DURAPREP 26ML APPLICATOR (WOUND CARE) ×2 IMPLANT
ELECT BLADE 4.0 EZ CLEAN MEGAD (MISCELLANEOUS) ×2
ELECT REM PT RETURN 9FT ADLT (ELECTROSURGICAL) ×2
ELECTRODE BLDE 4.0 EZ CLN MEGD (MISCELLANEOUS) IMPLANT
ELECTRODE REM PT RTRN 9FT ADLT (ELECTROSURGICAL) ×1 IMPLANT
GLOVE BIO SURGEON STRL SZ7.5 (GLOVE) ×2 IMPLANT
GLOVE BIO SURGEON STRL SZ8.5 (GLOVE) ×4 IMPLANT
GLOVE BIOGEL PI IND STRL 8 (GLOVE) ×2 IMPLANT
GLOVE BIOGEL PI IND STRL 9 (GLOVE) ×1 IMPLANT
GLOVE BIOGEL PI INDICATOR 8 (GLOVE) ×2
GLOVE BIOGEL PI INDICATOR 9 (GLOVE) ×1
GOWN STRL REUS W/ TWL LRG LVL3 (GOWN DISPOSABLE) ×2 IMPLANT
GOWN STRL REUS W/ TWL XL LVL3 (GOWN DISPOSABLE) ×3 IMPLANT
GOWN STRL REUS W/TWL LRG LVL3 (GOWN DISPOSABLE) ×2
GOWN STRL REUS W/TWL XL LVL3 (GOWN DISPOSABLE) ×6
HANDPIECE INTERPULSE COAX TIP (DISPOSABLE)
HOOD PEEL AWAY FACE SHEILD DIS (HOOD) ×8 IMPLANT
KIT BASIN OR (CUSTOM PROCEDURE TRAY) ×2 IMPLANT
KIT ROOM TURNOVER OR (KITS) ×2 IMPLANT
MANIFOLD NEPTUNE II (INSTRUMENTS) ×2 IMPLANT
NEEDLE 22X1 1/2 (OR ONLY) (NEEDLE) ×2 IMPLANT
NS IRRIG 1000ML POUR BTL (IV SOLUTION) ×2 IMPLANT
PACK TOTAL JOINT (CUSTOM PROCEDURE TRAY) ×2 IMPLANT
PACK UNIVERSAL I (CUSTOM PROCEDURE TRAY) ×2 IMPLANT
PAD ARMBOARD 7.5X6 YLW CONV (MISCELLANEOUS) ×4 IMPLANT
PASSER SUT SWANSON 36MM LOOP (INSTRUMENTS) ×2 IMPLANT
PRESSURIZER FEMORAL UNIV (MISCELLANEOUS) IMPLANT
SET HNDPC FAN SPRY TIP SCT (DISPOSABLE) IMPLANT
SUT ETHIBOND 2 V 37 (SUTURE) ×2 IMPLANT
SUT VIC AB 0 CTB1 27 (SUTURE) ×2 IMPLANT
SUT VIC AB 1 CTX 36 (SUTURE) ×2
SUT VIC AB 1 CTX36XBRD ANBCTR (SUTURE) ×1 IMPLANT
SUT VIC AB 2-0 CTB1 (SUTURE) ×2 IMPLANT
SUT VIC AB 3-0 SH 27 (SUTURE) ×2
SUT VIC AB 3-0 SH 27X BRD (SUTURE) ×1 IMPLANT
SYR CONTROL 10ML LL (SYRINGE) ×2 IMPLANT
TOWEL OR 17X24 6PK STRL BLUE (TOWEL DISPOSABLE) ×2 IMPLANT
TOWEL OR 17X26 10 PK STRL BLUE (TOWEL DISPOSABLE) ×2 IMPLANT
TOWER CARTRIDGE SMART MIX (DISPOSABLE) IMPLANT
TRAY FOLEY CATH 14FR (SET/KITS/TRAYS/PACK) IMPLANT
WATER STERILE IRR 1000ML POUR (IV SOLUTION) ×4 IMPLANT

## 2014-07-19 NOTE — Op Note (Signed)
OPERATIVE REPORT    DATE OF PROCEDURE:  07/19/2014       PREOPERATIVE DIAGNOSIS:  LEFT HIP OSTEOARTHRITIS                                                          POSTOPERATIVE DIAGNOSIS:  LEFT HIP OSTEOARTHRITIS                                                           PROCEDURE:  L total hip arthroplasty using a 54 mm DePuy Gryption Cup, [employer], 2 dome screws 35 mm, 50 mm, 10-degree polyethylene liner index superior  and posterior, a +0 36 mm ceramic head, a 18x13x42x150 SROM stem, 18FL Sleeve   SURGEON: ZOXWR,UEAVW J    ASSISTANT:   Eric K. [employer]  (present throughout entire procedure and necessary for timely completion of the procedure)   ANESTHESIA: Gen. endotracheal BLOOD LOSS: 300 mL FLUID REPLACEMENT: 1800 mL crystalloid Antibiotic: 1 g vancomycin Tranexamic Acid: 1 g IV COMPLICATIONS: none    INDICATIONS FOR PROCEDURE: A 59 y.o. year-old With  LEFT HIP OSTEOARTHRITIS   for 10 years, x-rays show bone-on-bone arthritic changes. Despite conservative measures with observation, anti-inflammatory medicine, narcotics, use of a cane, has severe unremitting pain and can ambulate only a few blocks before resting.  Patient desires elective left total hip arthroplasty to decrease pain and increase function. The risks, benefits, and alternatives were discussed at length including but not limited to the risks of infection, bleeding, nerve injury, stiffness, blood clots, the need for revision surgery, cardiopulmonary complications, among others, and they were willing to proceed. Questions answered     PROCEDURE IN DETAIL: The patient was identified by armband,  received preoperative IV antibiotics in the holding area at [facility], taken to the operating room , appropriate anesthetic monitors  were attached and general endotracheal anesthesia induced. Pt was rolled into the right lateral decubitus position and fixed there with a Stulberg Mark II pelvic clamp.   The left lower extremity was then prepped and draped  in the usual sterile fashion from the ankle to the hemipelvis. A time-out  procedure was performed. The skin along the lateral hip and thigh  infiltrated with 10 mL of 0.5% Marcaine and epinephrine solution. We  then made a posterolateral approach to the hip. With a #10 blade, a 18 cm  incision was made through the skin and subcutaneous tissue down to the level of the  IT band. Small bleeders were identified and cauterized. The IT band was cut in  line with skin incision exposing the greater trochanter. A Cobra retractor was placed between the gluteus minimus and the superior hip joint capsule, and a spiked Cobra between the quadratus femoris and the inferior hip joint capsule. This isolated the short  external rotators and piriformis tendons. These were tagged with a #2 Ethibond  suture and cut off their insertion on the intertrochanteric crest. The posterior  capsule was then developed into an acetabular-based flap from Posterior Superior off of the acetabulum out over the femoral neck and back posterior inferior to the acetabular rim.  This flap was tagged with two #2 Ethibond sutures and retracted protecting the sciatic nerve. This exposed the arthritic femoral head and osteophytes. The hip was then flexed and internally rotated, dislocating the femoral head and a standard neck cut performed 1 fingerbreadth above the lesser trochanter.  A spiked Cobra was placed in the cotyloid notch and a Hohmann retractor was then used to lever the femur anteriorly off of the anterior pelvic column. A posterior-inferior wing retractor was placed at the junction of the acetabulum and the ischium completing the acetabular exposure.the patient had erosion of the posterior superior rim, but the posterior wall nearly issue him was intact. We then removed the peripheral osteophytes and labrum from the acetabulum. We then reamed the acetabulum up to 53 mm with basket  reamers obtaining good coverage anteriorly and posteriorly. We then irrigated with normal  saline solution and hammered into place a 54 mm Gryption cup in 45  degrees of abduction and about 25 degrees of anteversion. We placed 2 supplemental dome screws of good firm fixation, 35 mm and 50 mm. The screws are placed in the safe stone posterior to the ASIS and anterior to the sciatic notch. More  peripheral osteophytes removed and a trial 10-degree liner placed with the  index superior-posterior. The hip was then flexed and internally rotated exposing the  proximal femur, which was entered with the initiating reamer followed by  the axial reamers up to a 13.5 mm full depth and 14 mm partial depth. We then conically reamed to 18 F to the correct depth for a 42 base neck. The calcar was milled to 18 F large. A trial sleeve and stem was inserted in the 20 degrees anteversion, with a +0 36mm trial head. Trial reduction was then performed and excellent stability was noted with at 90 of flexion with 70 of internal rotation and then full extension with maximal external rotation. The hip could not be dislocated in full extension. The knee could easily flex  to about 130 degrees. We also stretched the abductors at this point,  because of the preexisting adductor contractures. All trial components  were then removed. The acetabulum was irrigated out with normal saline  solution. A titanium [facility] Eliminator was then screwed into place  followed by a 10-degree polyethylene liner index superior-posterior. On  the femoral side a 18 F large ZTT1 sleeve was hammered into place, followed by a 18 x 13 x 42 x 1 50 SROM stem in 25 degrees of anteversion. At this point, a +0 36 mm ceramic head was  hammered on the stem. The hip was reduced. We checked our stability  one more time and found it to be excellent. The wound was once again  thoroughly irrigated out with normal saline solution hand lavage. The  capsular flap  and short external rotators were repaired back to the  intertrochanteric crest through drill holes with a #2 Ethibond suture.  The IT band was closed with running 1 Vicryl suture. The subcutaneous  tissue with 0 and 2-0 undyed Vicryl suture and the skin with running  3-0 vicryl subcuticular suture. Aquacil dressing was applied. The patient was then unclamped, rolled supine, awaken extubated and taken to recovery room without difficulty in stable condition.   Nestor LewandowskyROWAN,FRANK J 07/19/2014, 12:26 PM

## 2014-07-19 NOTE — Progress Notes (Signed)
Utilization review completed.  
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
## 2014-07-19 NOTE — Transfer of Care (Addendum)
Immediate Anesthesia Transfer of Care Note  Patient: Natasha BeckmannJudith Mish  Procedure(s) Performed: Procedure(s): TOTAL HIP ARTHROPLASTY (Left)  Patient Location: PACU  Anesthesia Type:General  Level of Consciousness: awake, alert , oriented and patient cooperative  Airway & Oxygen Therapy: Patient Spontanous Breathing and Patient connected to face mask oxygen  Post-op Assessment: Report given to RN, Post -op Vital signs reviewed and stable, Patient moving all extremities and Patient moving all extremities X 4  Post vital signs: Reviewed and stable  Last Vitals:  Filed Vitals:   07/19/14 0926  BP: 120/67  Pulse: 64  Temp: 36.8 C  Resp: 18   1st BP in PACU 83/59.  Second BP 98/62.  RN satisfied with vitals and will contact dr fitzgerald if BP decreases  Complications: No apparent anesthesia complications

## 2014-07-19 NOTE — Interval H&P Note (Signed)
History and Physical Interval Note:  07/19/2014 11:00 AM  Natasha BeckmannJudith Mohabir  has presented today for surgery, with the diagnosis of LEFT HIP OSTEOARTHRITIS  The various methods of treatment have been discussed with the patient and family. After consideration of risks, benefits and other options for treatment, the patient has consented to  Procedure(s): TOTAL HIP ARTHROPLASTY (Left) as a surgical intervention .  The patient's history has been reviewed, patient examined, no change in status, stable for surgery.  I have reviewed the patient's chart and labs.  Questions were answered to the patient's satisfaction.     Nestor LewandowskyOWAN,FRANK J

## 2014-07-19 NOTE — Anesthesia Procedure Notes (Signed)
Procedure Name: Intubation Date/Time: 07/19/2014 11:13 AM Performed by: Adonis HousekeeperNGELL, JANNA M Pre-anesthesia Checklist: Patient identified, Emergency Drugs available, Suction available and Patient being monitored Patient Re-evaluated:Patient Re-evaluated prior to inductionOxygen Delivery Method: Circle system utilized Preoxygenation: Pre-oxygenation with 100% oxygen Intubation Type: IV induction Ventilation: Mask ventilation without difficulty Laryngoscope Size: Miller, 2 and Glidescope (DL with miller 2 grade 4 view by crna; glidescope small grade 2 view) Grade View: Grade II Tube type: Oral Tube size: 7.0 mm Number of attempts: 1 Airway Equipment and Method: Stylet and Video-laryngoscopy Placement Confirmation: ETT inserted through vocal cords under direct vision,  positive ETCO2 and breath sounds checked- equal and bilateral Secured at: 22 cm Tube secured with: Tape Dental Injury: Teeth and Oropharynx as per pre-operative assessment

## 2014-07-19 NOTE — Discharge Instructions (Signed)

## 2014-07-19 NOTE — Anesthesia Preprocedure Evaluation (Addendum)
Anesthesia Evaluation  Patient identified by MRN, date of birth, ID band Patient awake    Reviewed: Allergy & Precautions, NPO status , Patient's Chart, lab work & pertinent test results  Airway Mallampati: II  TM Distance: >3 FB Neck ROM: Full    Dental  (+) Teeth Intact, Dental Advisory Given   Pulmonary former smoker,  breath sounds clear to auscultation        Cardiovascular negative cardio ROS  Rhythm:Regular Rate:Normal     Neuro/Psych negative neurological ROS  negative psych ROS   GI/Hepatic negative GI ROS, Neg liver ROS,   Endo/Other  negative endocrine ROS  Renal/GU negative Renal ROS     Musculoskeletal  (+) Arthritis -,   Abdominal   Peds  Hematology negative hematology ROS (+)   Anesthesia Other Findings   Reproductive/Obstetrics                            Anesthesia Physical Anesthesia Plan  ASA: I  Anesthesia Plan: General   Post-op Pain Management:    Induction: Intravenous  Airway Management Planned: Oral ETT  Additional Equipment:   Intra-op Plan:   Post-operative Plan: Extubation in OR  Informed Consent: I have reviewed the patients History and Physical, chart, labs and discussed the procedure including the risks, benefits and alternatives for the proposed anesthesia with the patient or authorized representative who has indicated his/her understanding and acceptance.   Dental advisory given  Plan Discussed with: CRNA  Anesthesia Plan Comments:        Anesthesia Quick Evaluation

## 2014-07-19 NOTE — Anesthesia Postprocedure Evaluation (Signed)
  Anesthesia Post-op Note  Patient: Natasha BeckmannJudith Frederick  Procedure(s) Performed: Procedure(s): TOTAL HIP ARTHROPLASTY (Left)  Patient Location: PACU  Anesthesia Type:GA combined with regional for post-op pain  Level of Consciousness: awake and alert   Airway and Oxygen Therapy: Patient Spontanous Breathing  Post-op Pain: mild  Post-op Assessment: Post-op Vital signs reviewed  Post-op Vital Signs: Reviewed  Last Vitals:  Filed Vitals:   07/19/14 1453  BP: 126/70  Pulse: 65  Temp: 36.7 C  Resp: 20    Complications: No apparent anesthesia complications

## 2014-07-20 ENCOUNTER — Encounter (HOSPITAL_COMMUNITY): Payer: Self-pay | Admitting: Orthopedic Surgery

## 2014-07-20 LAB — CBC
HCT: 31.5 % — ABNORMAL LOW (ref 36.0–46.0)
Hemoglobin: 10.1 g/dL — ABNORMAL LOW (ref 12.0–15.0)
MCH: 29.1 pg (ref 26.0–34.0)
MCHC: 32.1 g/dL (ref 30.0–36.0)
MCV: 90.8 fL (ref 78.0–100.0)
Platelets: 213 10*3/uL (ref 150–400)
RBC: 3.47 MIL/uL — ABNORMAL LOW (ref 3.87–5.11)
RDW: 13.8 % (ref 11.5–15.5)
WBC: 10.8 10*3/uL — ABNORMAL HIGH (ref 4.0–10.5)

## 2014-07-20 LAB — BASIC METABOLIC PANEL
Anion gap: 10 (ref 5–15)
BUN: 6 mg/dL (ref 6–23)
CO2: 24 mmol/L (ref 19–32)
Calcium: 8.8 mg/dL (ref 8.4–10.5)
Chloride: 105 mmol/L (ref 96–112)
Creatinine, Ser: 0.52 mg/dL (ref 0.50–1.10)
GFR calc Af Amer: >90 mL/min (ref >90)
GFR calc non Af Amer: >90 mL/min (ref >90)
Glucose, Bld: 140 mg/dL — ABNORMAL HIGH (ref 70–99)
Potassium: 4.1 mmol/L (ref 3.5–5.1)
Sodium: 139 mmol/L (ref 135–145)

## 2014-07-20 NOTE — Clinical Social Work Psychosocial (Signed)
Clinical Social Work Department BRIEF PSYCHOSOCIAL ASSESSMENT 07/20/2014  Patient:  Natasha Frederick, Natasha Frederick     Account Number:  [id number]     Admit date:  07/19/2014  Clinical Social Worker:  Durward Fortes, CLINICAL SOCIAL WORKER  Date/Time:  07/20/2014 02:27 PM  Referred by:  Physician  Date Referred:  07/20/2014 Referred for  SNF Placement   Other Referral:   none.   Interview type:  Patient Other interview type:   none.    PSYCHOSOCIAL DATA Living Status:  HUSBAND Admitted from facility:   Level of care:   Primary support name:  Steele Sizer Primary support relationship to patient:  SPOUSE Degree of support available:   Adequate suport.    CURRENT CONCERNS Current Concerns  Post-Acute Placement   Other Concerns:   none.    SOCIAL WORK ASSESSMENT / PLAN CSW and BSW-Intern conulted regarding SNF placement for pt once medically stable for discharge.    BSW-Intern met with pt at bedside regarding SNF placement optins. Pt informed BSW-Intern that pt lives with pt's husband Bobby Rumpf) and plans to return back home with husband once discharged from SNF. Pt also informed BSW-Intern that pt has previously had rehab at [facility] in [facility], however isnt sure if pt would like to return there again.    Pt informed BSW-Intern that if pt is sent to a facility that pt woul dfeel safer going by non-emergency ambulance at time of discharge.    Pt is very involved in pt's care and is looking foward to the return back home with family.    CSW and BSW-Intern to continue to assist with discharge planning needs.   Assessment/plan status:  Psychosocial Support/Ongoing Assessment of Needs Other assessment/ plan:   none.   Information/referral to community resources:   Pt to be discharged to [facility] once medically stable for discharge.    PATIENT'S/FAMILY'S RESPONSE TO PLAN OF CARE: Pt and pt's family agreeable and understanding of CSW plan of care. Pt expressed no further  questions or concerns at this time.       Virgie Dad Wiley, BSW-Intern

## 2014-07-20 NOTE — Clinical Social Work Placement (Signed)
Clinical Social Work Department CLINICAL SOCIAL WORK PLACEMENT NOTE 07/20/2014  Patient:  Natasha Frederick,Natasha Frederick  Account Number:  [id number] Admit date:  07/19/2014  Clinical Social Worker:  Hortencia PilarKIERRA WILEY, CLINICAL SOCIAL WORKER  Date/time:  07/20/2014 02:36 PM  Clinical Social Work is seeking post-discharge placement for this patient at the following level of care:   SKILLED NURSING   (*CSW will update this form in Epic as items are completed)   07/20/2014  Patient/family provided with [facility] of Clinical [facility] Work's list of facilities offering this level of care within the geographic area requested by the patient (or if unable, by the patient's family).  07/20/2014  Patient/family informed of their freedom to choose among providers that offer the needed level of care, that participate in Medicare, Medicaid or managed care program needed by the patient, have an available bed and are willing to accept the patient.  07/20/2014  Patient/family informed of [facility]' ownership interest in [facility], as well as of the fact that they are under no obligation to receive care at this facility.  PASARR submitted to EDS on 07/20/2014 PASARR number received on 07/20/2014  FL2 transmitted to all facilities in geographic area requested by pt/family on  07/20/2014 FL2 transmitted to all facilities within larger geographic area on   Patient informed that his/her managed care company has contracts with or will negotiate with  certain facilities, including the following:     Patient/family informed of bed offers received:   Patient chooses bed at  Physician recommends and patient chooses bed at    Patient to be transferred to  on   Patient to be transferred to facility by [facility] Patient and family notified of transfer on  Name of family member notified:    The following physician request were entered in Epic:   Additional Comments:   Kierra S. Wiley, BSW-Intern

## 2014-07-20 NOTE — Plan of Care (Signed)
Problem: Consults Goal: Diagnosis- Total Joint Replacement Primary Total Hip Left     

## 2014-07-20 NOTE — Progress Notes (Signed)
PATIENT ID: Natasha BeckmannJudith Katzman  MRN: [id number]  DOB/AGE:  09/10/1955 / 59 y.o.  1 Day Post-Op Procedure(s) (LRB): TOTAL HIP ARTHROPLASTY (Left)    PROGRESS NOTE Subjective: Patient is alert, oriented, no Nausea, no Vomiting, yes passing gas, no Bowel Movement. Taking PO well. Denies SOB, Chest or Calf Pain. Using Incentive Spirometer, PAS in place. Ambulate WBAT with pt up with therapy yesterday. Patient reports pain as 8 on 0-10 scale.  With pt just getting her oral pain medication norco 30 minutes prior.    Objective: Vital signs in last 24 hours: Filed Vitals:   07/20/14 0000 07/20/14 0052 07/20/14 0400 07/20/14 0435  BP:  118/74  102/60  Pulse:  84  82  Temp:  98 F (36.7 C)  98.1 F (36.7 C)  TempSrc:      Resp: 18 16 16 16   Height:      Weight:      SpO2: 100% 97% 98% 98%      Intake/Output from previous day: I/O last 3 completed shifts: In: 2718.8 [I.V.:2718.8] Out: 150 [Blood:150]   Intake/Output this shift:     LABORATORY DATA:  Recent Labs  07/20/14 0635  WBC 10.8*  HGB 10.1*  HCT 31.5*  PLT 213  NA 139  K 4.1  CL 105  CO2 24  BUN 6  CREATININE 0.52  GLUCOSE 140*  CALCIUM 8.8    Examination: Neurologically intact Neurovascular intact Sensation intact distally Intact pulses distally Dorsiflexion/Plantar flexion intact Incision: moderate drainage No cellulitis present Compartment soft} XR AP&Lat of hip shows well placed\fixed THA  Assessment:   1 Day Post-Op Procedure(s) (LRB): TOTAL HIP ARTHROPLASTY (Left) ADDITIONAL DIAGNOSIS:  Expected Acute Blood Loss Anemia,  Plan: PT/OT WBAT, THA  posterior precautions  DVT Prophylaxis: SCDx72 hrs, ASA 325 mg BID x 2 weeks  DISCHARGE PLAN: Skilled Nursing Facility/Rehab  DISCHARGE NEEDS: HHPT, HHRN, Walker and 3-in-1 comode seat
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
## 2014-07-20 NOTE — Plan of Care (Deleted)
Problem: Consults Goal: Diagnosis- Total Joint Replacement Primary Total Knee Left     

## 2014-07-20 NOTE — Progress Notes (Signed)
Orthopedic Tech Progress Note Patient Details:  Natasha BeckmannJudith Frederick 03/07/1956 [id number]  Ortho Devices Type of Ortho Device: Abduction pillow Ortho Device/Splint Location: hip Ortho Device/Splint Interventions: Freeman CaldronOrdered   Crawford, Rembert 07/20/2014, 11:59 AM

## 2014-07-21 LAB — CBC
HCT: 29.8 % — ABNORMAL LOW (ref 36.0–46.0)
Hemoglobin: 9.3 g/dL — ABNORMAL LOW (ref 12.0–15.0)
MCH: 28.5 pg (ref 26.0–34.0)
MCHC: 31.2 g/dL (ref 30.0–36.0)
MCV: 91.4 fL (ref 78.0–100.0)
Platelets: 207 10*3/uL (ref 150–400)
RBC: 3.26 MIL/uL — ABNORMAL LOW (ref 3.87–5.11)
RDW: 14 % (ref 11.5–15.5)
WBC: 9.5 10*3/uL (ref 4.0–10.5)
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
## 2014-07-21 NOTE — Progress Notes (Signed)
PATIENT ID: Natasha Frederick  MRN: [id number]  DOB/AGE:  04/28/1955 / 59 y.o.  2 Days Post-Op Procedure(s) (LRB): TOTAL HIP ARTHROPLASTY (Left)    PROGRESS NOTE Subjective: Patient is alert, oriented, no Nausea, no Vomiting, yes passing gas, no Bowel Movement. Taking PO well. Denies SOB, Chest or Calf Pain. Using Incentive Spirometer, PAS in place. Ambulate WBAT with pt walking 70 ft. Patient reports pain as moderate  .    Objective: Vital signs in last 24 hours: Filed Vitals:   07/20/14 2000 07/21/14 0000 07/21/14 0325 07/21/14 0537  BP:    127/53  Pulse:    82  Temp:    98.4 F (36.9 C)  TempSrc:      Resp: 20 18 18 18   Height:      Weight:      SpO2: 100% 100% 100% 97%      Intake/Output from previous day: I/O last 3 completed shifts: In: 2758.8 [P.O.:1040; I.V.:1718.8] Out: -    Intake/Output this shift:     LABORATORY DATA:  Recent Labs  07/20/14 0635 07/21/14 0534  WBC 10.8* 9.5  HGB 10.1* 9.3*  HCT 31.5* 29.8*  PLT 213 207  NA 139  --   K 4.1  --   CL 105  --   CO2 24  --   BUN 6  --   CREATININE 0.52  --   GLUCOSE 140*  --   CALCIUM 8.8  --     Examination: Neurologically intact Neurovascular intact Sensation intact distally Intact pulses distally Dorsiflexion/Plantar flexion intact Incision: moderate drainage No cellulitis present Compartment soft} XR AP&Lat of hip shows well placed\fixed THA  Assessment:   2 Days Post-Op Procedure(s) (LRB): TOTAL HIP ARTHROPLASTY (Left) ADDITIONAL DIAGNOSIS:  Expected Acute Blood Loss Anemia,  Plan: PT/OT WBAT, THA  posterior precautions  DVT Prophylaxis: SCDx72 hrs, ASA 325 mg BID x 2 weeks  DISCHARGE PLAN: Skilled Nursing Facility/Rehab  DISCHARGE NEEDS: HHPT, HHRN, Walker and 3-in-1 comode seat

## 2014-07-21 NOTE — Discharge Summary (Addendum)
Patient ID: Natasha BeckmannJudith Frederick MRN: [id number] DOB/AGE: 61/07/1955 61 y.o.  Admit date: 07/19/2014 Discharge date: 07/22/14   Admission Diagnoses:  Active Problems:   Primary osteoarthritis of left hip   Discharge Diagnoses:  Same  Past Medical History  Diagnosis Date  . Endometriosis   . Arthritis     hip  . Scoliosis     Surgeries: Procedure(s): TOTAL HIP ARTHROPLASTY on 07/19/2014   Consultants:    Discharged Condition: Improved  Hospital Course: Natasha BeckmannJudith Hambly is an 59 y.o. female who was admitted 07/19/2014 for operative treatment of<principal problem not specified>. Patient has severe unremitting pain that affects sleep, daily activities, and work/hobbies. After pre-op clearance the patient was taken to the operating room on 07/19/2014 and underwent  Procedure(s): TOTAL HIP ARTHROPLASTY.    Patient was given perioperative antibiotics: Anti-infectives    Start     Dose/Rate Route Frequency Ordered Stop   07/19/14 0600  vancomycin (VANCOCIN) IVPB 1000 mg/200 mL premix     1,000 mg 200 mL/hr over 60 Minutes Intravenous On call to O.R. 07/18/14 1312 07/19/14 1145       Patient was given sequential compression devices, early ambulation, and chemoprophylaxis to prevent DVT.  Patient benefited maximally from hospital stay and there were no complications.    Recent vital signs: Patient Vitals for the past 24 hrs:  BP Temp Temp src Pulse Resp SpO2  07/21/14 0537 (!) 127/53 mmHg 98.4 F (36.9 C) - 82 18 97 %  07/21/14 0325 - - - - 18 100 %  07/21/14 0000 - - - - 18 100 %  07/20/14 2000 - - - - 20 100 %  07/20/14 1944 (!) 121/58 mmHg 98.5 F (36.9 C) Oral 84 20 100 %  07/20/14 1600 - - - - 18 -  07/20/14 1330 115/63 mmHg 99.1 F (37.3 C) Oral 70 18 100 %  07/20/14 1200 - - - - 18 -  07/20/14 0800 - - - - 18 -     Recent laboratory studies:  Recent Labs  07/20/14 0635 07/21/14 0534  WBC 10.8* 9.5  HGB 10.1* 9.3*  HCT 31.5* 29.8*  PLT 213 207  NA 139  --   K 4.1  --    CL 105  --   CO2 24  --   BUN 6  --   CREATININE 0.52  --   GLUCOSE 140*  --   CALCIUM 8.8  --      Discharge Medications:     Medication List    STOP taking these medications        acetaminophen 500 MG tablet  Commonly known as:  TYLENOL     azithromycin 250 MG tablet  Commonly known as:  ZITHROMAX     ibuprofen 600 MG tablet  Commonly known as:  ADVIL,MOTRIN     ibuprofen 800 MG tablet  Commonly known as:  ADVIL,MOTRIN      TAKE these medications        aspirin EC 325 MG tablet  Take 1 tablet (325 mg total) by mouth 2 (two) times daily.     HYDROcodone-acetaminophen 5-325 MG per tablet  Commonly known as:  NORCO  Take 1 tablet by mouth every 6 (six) hours as needed.     methocarbamol 500 MG tablet  Commonly known as:  ROBAXIN  Take 1 tablet (500 mg total) by mouth 2 (two) times daily with a meal.        Diagnostic Studies: Dg Chest 2 View  07/09/2014   CLINICAL DATA:  Pre operative respiratory exam for left total hip arthroplasty.  EXAM: CHEST  2 VIEW  COMPARISON:  None.  FINDINGS: Heart size and pulmonary vascularity are normal and the lungs are clear except for slight bilateral apical pleural thickening. No effusions. Minimal thoracic scoliosis with reversal of the cervical kyphosis.  IMPRESSION: No acute abnormality.   Electronically Signed   By: Francene Boyers M.D.   On: 07/09/2014 10:26   Dg Pelvis Portable  07/19/2014   CLINICAL DATA:  Left hip replacement. Osteoarthritis of the left hip.  EXAM: PORTABLE PELVIS 1-2 VIEWS  COMPARISON:  New  FINDINGS: The a left total hip arthroplasty is noted. Gas and fluid is present within the joint as expected. The hip appears located on this single view. There no fractures. The visualized pelvis is intact.  IMPRESSION: Status post left total hip arthroplasty without radiographic evidence for complication.   Electronically Signed   By: Marin Roberts M.D.   On: 07/19/2014 14:43    Disposition: 01-Home or Self  Care      Discharge Instructions    Call MD / Call 911    Complete by:  As directed   If you experience chest pain or shortness of breath, CALL 911 and be transported to the hospital emergency room.  If you develope a fever above 101 F, pus (white drainage) or increased drainage or redness at the wound, or calf pain, call your surgeon's office.     Change dressing    Complete by:  As directed   You may change your dressing on day 5, then change the dressing daily with sterile 4 x 4 inch gauze dressing and paper tape.  You may clean the incision with alcohol prior to redressing     Constipation Prevention    Complete by:  As directed   Drink plenty of fluids.  Prune juice may be helpful.  You may use a stool softener, such as Colace (over the counter) 100 mg twice a day.  Use MiraLax (over the counter) for constipation as needed.     Diet - low sodium heart healthy    Complete by:  As directed      Discharge instructions    Complete by:  As directed   Follow up in office with Dr. Turner Daniels in 2 weeks.     Driving restrictions    Complete by:  As directed   No driving for 2 weeks     Follow the hip precautions as taught in Physical Therapy    Complete by:  As directed      Increase activity slowly as tolerated    Complete by:  As directed      Patient may shower    Complete by:  As directed   You may shower without a dressing once there is no drainage.  Do not wash over the wound.  If drainage remains, cover wound with plastic wrap and then shower.           Follow-up Information    Follow up with Nestor Lewandowsky, MD In 2 weeks.   Specialty:  Orthopedic Surgery   Contact information:   [address] [place] [address] [phone]        Signed: Henry Russel 07/21/2014, 7:56 AM

## 2014-07-21 NOTE — Clinical Social Work Placement (Addendum)
Clinical Social Work Department CLINICAL SOCIAL WORK PLACEMENT NOTE 07/20/2014  Patient:  Natasha Frederick,Natasha Frederick  Account Number:  [id number] Admit date:  07/19/2014  Clinical Social Worker:  Hortencia PilarKIERRA WILEY, CLINICAL SOCIAL WORKER  Date/time:  07/20/2014 02:36 PM  Clinical Social Work is seeking post-discharge placement for this patient at the following level of care:   SKILLED NURSING   (*CSW will update this form in Epic as items are completed)   07/20/2014  Patient/family provided with [facility] of Clinical Social Work's list of facilities offering this level of care within the geographic area requested by the patient (or if unable, by the patient's family).  07/20/2014  Patient/family informed of their freedom to choose among providers that offer the needed level of care, that participate in Medicare, Medicaid or managed care program needed by the patient, have an available bed and are willing to accept the patient.  07/20/2014  Patient/family informed of [facility]' ownership interest in [facility], as well as of the fact that they are under no obligation to receive care at this facility.  PASARR submitted to EDS on 07/20/2014 PASARR number received on 07/20/2014  FL2 transmitted to all facilities in geographic area requested by pt/family on  07/20/2014 FL2 transmitted to all facilities within larger geographic area on   Patient informed that his/her managed care company has contracts with or will negotiate with  certain facilities, including the following:     Patient/family informed of bed offers received:  07/21/2014 [facility] Physician recommends and patient chooses bed at    [facility]  07/22/2014 Patient to be transferred to facility by [facility] Patient and family notified of transfer on 07/22/2014 Name of family member notified:  Patient notified at bedside  The following physician request were  entered in Epic:   Additional Comments:   Kierra S. Wiley, BSW-Intern   Marcelline DeistEmily Vaughn, [place] Cell: [phone]       Fax: [phone] Clinical Social Work: Orthopedics [phone]([id number]) and Surgical [phone]([id number])

## 2014-07-21 NOTE — Progress Notes (Signed)
Patient stated she "pulled my shoulder" this afternoon, when entered room and noted she had an ice pack on her Rt shoulder. Has full ROM and reports no history of problems with her shoulder. Stated she was pulling herself up in bed.

## 2014-07-22 LAB — CBC
HCT: 25.7 % — ABNORMAL LOW (ref 36.0–46.0)
Hemoglobin: 8.2 g/dL — ABNORMAL LOW (ref 12.0–15.0)
MCH: 29 pg (ref 26.0–34.0)
MCHC: 31.9 g/dL (ref 30.0–36.0)
MCV: 90.8 fL (ref 78.0–100.0)
Platelets: 189 10*3/uL (ref 150–400)
RBC: 2.83 MIL/uL — ABNORMAL LOW (ref 3.87–5.11)
RDW: 13.7 % (ref 11.5–15.5)
WBC: 7.5 10*3/uL (ref 4.0–10.5)

## 2014-07-22 NOTE — Progress Notes (Signed)
Patient ID: Natasha Frederick, female   DOB: 10/05/1955, 59 y.o.   MRN: [id number] PATIENT ID: Natasha Frederick  MRN: [id number]  DOB/AGE:  05/19/1955 / 59 y.o.  3 Days Post-Op Procedure(s) (LRB): TOTAL HIP ARTHROPLASTY (Left)    PROGRESS NOTE Subjective: Patient is alert, oriented, no Nausea, no Vomiting, yes passing gas, no Bowel Movement. Taking PO well. Denies SOB, Chest or Calf Pain. Using Incentive Spirometer, PAS in place. Ambulate 90 ft Patient reports pain as 2 on 0-10 scale  .    Objective: Vital signs in last 24 hours: Filed Vitals:   07/21/14 2134 07/22/14 0000 07/22/14 0400 07/22/14 0547  BP: 111/47   108/73  Pulse: 81   81  Temp: 98.4 F (36.9 C)   98.4 F (36.9 C)  TempSrc: Oral   Oral  Resp: 17 18 16 16   Height:      Weight:      SpO2: 100% 100% 100% 97%      Intake/Output from previous day: I/O last 3 completed shifts: In: 1200 [P.O.:1200] Out: -    Intake/Output this shift:     LABORATORY DATA:  Recent Labs  07/20/14 0635 07/21/14 0534 07/22/14 0518  WBC 10.8* 9.5 7.5  HGB 10.1* 9.3* 8.2*  HCT 31.5* 29.8* 25.7*  PLT 213 207 189  NA 139  --   --   K 4.1  --   --   CL 105  --   --   CO2 24  --   --   BUN 6  --   --   CREATININE 0.52  --   --   GLUCOSE 140*  --   --   CALCIUM 8.8  --   --     Examination: Neurologically intact ABD soft Neurovascular intact Sensation intact distally Intact pulses distally Dorsiflexion/Plantar flexion intact Incision: dressing C/D/I No cellulitis present Compartment soft} XR AP&Lat of hip shows well placed\fixed THA  Assessment:   3 Days Post-Op Procedure(s) (LRB): TOTAL HIP ARTHROPLASTY (Left) ADDITIONAL DIAGNOSIS:  Expected Acute Blood Loss Anemia,   Plan: PT/OT WBAT, THA  posterior precautions  DVT Prophylaxis: SCDx72 hrs, ASA 325 mg BID x 2 weeks  DISCHARGE PLAN: Skilled Nursing Facility/Rehab, [facility]  DISCHARGE NEEDS: HHPT, HHRN, CPM, Walker and 3-in-1 comode seat
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
## 2014-07-22 NOTE — Discharge Planning (Signed)
Patient to be [facility]. Patient updated at bedside.  [facility] Report number: [id number] Transport: EMS ([facility]) scheduled for 1:30pm when bed available  Lily Kochermily Vaughn, LCSWA Cell: [phone]       Fax: [phone] Clinical Social Work: Haematologistrthopedics [phone]([id number]) and Surgical [phone]([id number])

## 2014-07-22 NOTE — Progress Notes (Signed)
[facility].  [facility].  Patient will be transported by [facility] to room 902. Patient's walker taken by Ray[place] Citizenaroline at facility.

## 2014-07-27 ENCOUNTER — Non-Acute Institutional Stay (SKILLED_NURSING_FACILITY): Payer: Medicare Other | Admitting: Internal Medicine

## 2014-07-27 ENCOUNTER — Encounter: Payer: Self-pay | Admitting: Internal Medicine

## 2014-07-27 DIAGNOSIS — K5901 Slow transit constipation: Secondary | ICD-10-CM

## 2014-07-27 DIAGNOSIS — D62 Acute posthemorrhagic anemia: Secondary | ICD-10-CM

## 2014-07-27 DIAGNOSIS — M1612 Unilateral primary osteoarthritis, left hip: Secondary | ICD-10-CM

## 2014-07-27 NOTE — Progress Notes (Signed)
This encounter was created in error - please disregard.

## 2014-07-27 NOTE — Progress Notes (Signed)
Patient ID: Natasha Frederick, female   DOB: 07/24/1955, 59 y.o.   MRN: [id number]     [facility]   PCP: Kristian CoveyBURCHETTE,BRUCE W, MD  Code Status: full code  Allergies  Allergen Reactions  . Bee Venom Anaphylaxis and Swelling  . Adhesive [Tape] Other (See Comments)    redness  . Penicillins Hives  . Sulfa Antibiotics Hives    Chief Complaint  Patient presents with  . New Admit To SNF     HPI:  59 year old patient is here for short term rehabilitation post hospital admission from 07/19/14-07/21/14 with left hip OA. She underwent left hip arthroplasty. She is seen in her room today. Her pain is under control. She denies any concerns. Her husband is present during the visit.  Review of Systems:  Constitutional: Negative for fever, chills, diaphoresis.  HENT: Negative for headache, congestion, nasal discharge Eyes: Negative for eye pain, blurred vision, double vision and discharge.  Respiratory: Negative for cough, shortness of breath and wheezing.   Cardiovascular: Negative for chest pain, palpitations, leg swelling.  Gastrointestinal: Negative for heartburn, nausea, vomiting, abdominal pain. Had bowel movement this am Genitourinary: Negative for dysuria Musculoskeletal: Negative for back pain, falls. Skin: Negative for itching, rash.  Neurological: positive for weakness. Negative for dizziness, tingling, focal weakness Psychiatric/Behavioral: Negative for depression   Past Medical History  Diagnosis Date  . Endometriosis   . Arthritis     hip  . Scoliosis    Past Surgical History  Procedure Laterality Date  . Tonsillectomy  1958  . Oophorectomy  1977    left  . Appendectomy  1963  . Tubal ligation  1986  . Colonoscopy w/ polypectomy    . Planters wart Left 2005  . Anterior lat lumbar fusion Left 01/05/2014    Procedure: LUMBAR TWO TO THREE, LUMBAR LUMBAR THREE TO FOUR, ANTERIOR LATERAL LUMBAR FUSION 2 LEVELS;  Surgeon: Reinaldo Meekerandy O Kritzer, MD;   Location: [facility];  Service: Neurosurgery;  Laterality: Left;  . Radial keratoto Bilateral   . Breast surgery Left 1987    bx  . Eye surgery    . Total hip arthroplasty Left 07/19/2014    Procedure: TOTAL HIP ARTHROPLASTY;  Surgeon: Gean BirchwoodFrank Rowan, MD;  Location: [facility] OR;  Service: Orthopedics;  Laterality: Left;   Social History:   reports that she quit smoking about 20 years ago. Her smoking use included Cigarettes. She has a 8 pack-year smoking history. She has never used smokeless tobacco. She reports that she drinks about 3.6 oz of alcohol per week. She reports that she does not use illicit drugs.  Family History  Problem Relation Age of Onset  . Arthritis Neg Hx     family hx  . Cancer Neg Hx     prostate ca -family hx  . Heart disease Neg Hx     family hx    Medications: Patient's Medications  New Prescriptions   No medications on file  Previous Medications   ASPIRIN EC 325 MG TABLET    Take 1 tablet (325 mg total) by mouth 2 (two) times daily.   HYDROCODONE-ACETAMINOPHEN (NORCO) 5-325 MG PER TABLET    Take 1 tablet by mouth every 6 (six) hours as needed.   METHOCARBAMOL (ROBAXIN) 500 MG TABLET    Take 1 tablet (500 mg total) by mouth 2 (two) times daily with a meal.  Modified Medications   No medications on file  Discontinued Medications   No medications on  file     Physical Exam Filed Vitals:   07/27/14 1622  BP: 118/50  Pulse: 73  Temp: 98.7 F (37.1 C)  Resp: 18  Weight: 148 lb 3.2 oz (67.223 kg)  SpO2: 94%    General- elderly female, well built, in no acute distress Head- normocephalic, atraumatic Nose- no maxillary or frontal sinus tenderness, no nasal discharge Throat- moist mucus membrane Eyes- PERRLA, EOMI, no pallor, no icterus, no discharge, normal conjunctiva, normal sclera Neck- no cervical lymphadenopathy Cardiovascular- normal s1,s2, no murmurs, palpable dorsalis pedis and radial pulses, left leg edema present Respiratory- bilateral clear to  auscultation, no wheeze, no rhonchi, no crackles, no use of accessory muscles Abdomen- bowel sounds present, soft, non tender Musculoskeletal- able to move all 4 extremities, left leg range of motion limited  Neurological- no focal deficit Skin- warm and dry, left thigh has aquacel dressing and minimal resolving bruise Psychiatry- alert and oriented to person, place and time, normal mood and affect    Labs reviewed: Basic Metabolic Panel:  Recent Labs  40/98/11 2346 07/09/14 1007 07/20/14 0635  NA 139 141 139  K 3.1* 4.2 4.1  CL 98 110 105  CO2 GLUCOSE 113* 98 140*  BUN CREATININE 0.46* 0.68 0.52  CALCIUM 9.0 9.9 8.8    CBC:  Recent Labs  01/14/14 0054 07/09/14 1007 07/20/14 0635 07/21/14 0534 07/22/14 0518  WBC 8.1 8.2 10.8* 9.5 7.5  NEUTROABS 6.6 6.5  --   --   --   HGB 10.9* 12.1 10.1* 9.3* 8.2*  HCT 31.9* 37.7 31.5* 29.8* 25.7*  MCV 86.0 89.5 90.8 91.4 90.8  PLT 290 298 213 207 189    Assessment/Plan  Left hip OA S/p left hip arthroplasty. Will have her work with physical therapy and occupational therapy team to help with gait training and muscle strengthening exercises.fall precautions. Skin care. Encourage to be out of bed. Continue robaxin 500 mg bid and q6h prn for muscle spasm and norco 5/325 2 tab q6h prn for pain. Has f/u with orthopedics. Continue aspirin 325 mg bid for dvt prophylaxis. Ted hose in place  Blood loss anemia Post op, monitor h&h  Constipation Continue senna s 2 tab bid and monitor  Goals of care: short term rehabilitation   Labs/tests ordered: cbc  Family/ staff Communication: reviewed care plan with patient and nursing supervisor    Oneal Grout, MD  [facility] [phone] (Monday-Friday 8 am - 5 pm) [phone] (afterhours)

## 2014-08-03 ENCOUNTER — Encounter: Payer: Self-pay | Admitting: Adult Health

## 2014-08-03 ENCOUNTER — Non-Acute Institutional Stay: Payer: Federal, State, Local not specified - PPO | Admitting: Adult Health

## 2014-08-03 DIAGNOSIS — M1612 Unilateral primary osteoarthritis, left hip: Secondary | ICD-10-CM

## 2014-08-03 DIAGNOSIS — D62 Acute posthemorrhagic anemia: Secondary | ICD-10-CM

## 2014-08-03 DIAGNOSIS — K5901 Slow transit constipation: Secondary | ICD-10-CM

## 2014-08-03 NOTE — Progress Notes (Signed)
Patient ID: Natasha Frederick, female   DOB: Feb 05, 1956, 59 y.o.   MRN: [id number]   08/03/2014  Facility:  Nursing Home Location:  [facility]   LEVEL OF CARE:  SNF (31)   Chief Complaint  Patient presents with  . Discharge Note    Osteoarthritis S/P left total hip arthroplasty, anemia and constipation    HISTORY OF PRESENT ILLNESS:  This is a 59 year old female is for discharge home with home health PT and OT. She has been admitted to [facility] from [facility] on 07/22/14 with Osteoarthritis S/P left total hip arthroplasty.  Patient was admitted to this facility for short-term rehabilitation after the patient's recent hospitalization.  Patient has completed SNF rehabilitation and therapy has cleared the patient for discharge.  PAST MEDICAL HISTORY:  Past Medical History  Diagnosis Date  . Endometriosis   . Arthritis     hip  . Scoliosis     CURRENT MEDICATIONS: Reviewed per MAR/see medication list  Allergies  Allergen Reactions  . Bee Venom Anaphylaxis and Swelling  . Adhesive [Tape] Other (See Comments)    redness  . Penicillins Hives  . Sulfa Antibiotics Hives     REVIEW OF SYSTEMS:  GENERAL: no change in appetite, no fatigue, no weight changes, no fever, chills or weakness RESPIRATORY: no cough, SOB, DOE, wheezing, hemoptysis CARDIAC: no chest pain, edema or palpitations GI: no abdominal pain, diarrhea, constipation, heart burn, nausea or vomiting  PHYSICAL EXAMINATION  GENERAL: no acute distress, normal body habitus SKIN:  Left hip surgical incision is dry, healed EYES: conjunctivae normal, sclerae normal, normal eye lids NECK: supple, trachea midline, no neck masses, no thyroid tenderness, no thyromegaly LYMPHATICS: no LAN in the neck, no supraclavicular LAN RESPIRATORY: breathing is even & unlabored, BS CTAB CARDIAC: RRR, no murmur,no extra heart sounds, no edema GI: abdomen soft, normal BS, no masses, no tenderness, no hepatomegaly,  no splenomegaly EXTREMITIES: Able to move 4 extremities; walks with walker PSYCHIATRIC: the patient is alert & oriented to person, affect & behavior appropriate  LABS/RADIOLOGY: Labs reviewed: 07/26/14  WBC 6.9 hemoglobin 9.7 hematocrit 29.9 MCV 87.7 sodium 141 potassium 3.9 glucose 98 BUN 10 creatinine 0.51 total bili reviewed 0.6 alkaline phosphatase 63 SGOT 14 SGPT 12 total protein 6.2 albumin 3.7 calcium 9.0 Basic Metabolic Panel:  Recent Labs  14/78/29 2346 07/09/14 1007 07/20/14 0635  NA 139 141 139  K 3.1* 4.2 4.1  CL 98 110 105  CO2 GLUCOSE 113* 98 140*  BUN CREATININE 0.46* 0.68 0.52  CALCIUM 9.0 9.9 8.8   CBC:  Recent Labs  01/14/14 0054 07/09/14 1007 07/20/14 0635 07/21/14 0534 07/22/14 0518  WBC 8.1 8.2 10.8* 9.5 7.5  NEUTROABS 6.6 6.5  --   --   --   HGB 10.9* 12.1 10.1* 9.3* 8.2*  HCT 31.9* 37.7 31.5* 29.8* 25.7*  MCV 86.0 89.5 90.8 91.4 90.8  PLT 290 298 213 207 189    Dg Chest 2 View  07/09/2014   CLINICAL DATA:  Pre operative respiratory exam for left total hip arthroplasty.  EXAM: CHEST  2 VIEW  COMPARISON:  None.  FINDINGS: Heart size and pulmonary vascularity are normal and the lungs are clear except for slight bilateral apical pleural thickening. No effusions. Minimal thoracic scoliosis with reversal of the cervical kyphosis.  IMPRESSION: No acute abnormality.   Electronically Signed   By: Francene Boyers M.D.   On: 07/09/2014 10:26  Dg Pelvis Portable  07/19/2014   CLINICAL DATA:  Left hip replacement. Osteoarthritis of the left hip.  EXAM: PORTABLE PELVIS 1-2 VIEWS  COMPARISON:  New  FINDINGS: The a left total hip arthroplasty is noted. Gas and fluid is present within the joint as expected. The hip appears located on this single view. There no fractures. The visualized pelvis is intact.  IMPRESSION: Status post left total hip arthroplasty without radiographic evidence for complication.   Electronically Signed   By: Marin Robertshristopher   Mattern M.D.   On: 07/19/2014 14:43    ASSESSMENT/PLAN:  Osteoarthritis S/P left total hip arthroplasty - for home health PT and OT; continue Robaxin 500 mg 1 tab by mouth every 6 hours when necessary for muscle spasm; aspirin 325 mg 1 tab by mouth twice a day for DVT prophylaxis; and Norco 5/325 mg 1-2 tabs by mouth every 6 hours when necessary for pain Constipation - continue senna S2 tabs by mouth twice a day Anemia, acute blood loss - hemoglobin 8.7; stable    I have filled out patient's discharge paperwork and written prescriptions.  Patient will receive home health PT and OT.  Total discharge time: Less than 30 minutes  Discharge time involved coordination of the discharge process with Child psychotherapistsocial worker, nursing staff and therapy department. Medical justification for home health services verified.     [facility]ARG[facility],MONINA, NP [facility] [phone]

## 2014-08-16 ENCOUNTER — Other Ambulatory Visit: Payer: Self-pay | Admitting: Adult Health

## 2014-09-14 ENCOUNTER — Telehealth: Payer: Self-pay

## 2014-09-14 NOTE — Telephone Encounter (Signed)
Postponed mamm due to other health issues.

## 2014-12-24 ENCOUNTER — Ambulatory Visit (INDEPENDENT_AMBULATORY_CARE_PROVIDER_SITE_OTHER): Payer: Federal, State, Local not specified - PPO | Admitting: Family Medicine

## 2014-12-24 VITALS — BP 140/80 | HR 73 | Temp 98.3°F | Ht 65.0 in | Wt 149.4 lb

## 2014-12-24 DIAGNOSIS — R05 Cough: Secondary | ICD-10-CM

## 2014-12-24 DIAGNOSIS — R059 Cough, unspecified: Secondary | ICD-10-CM

## 2014-12-24 MED ORDER — BENZONATATE 200 MG PO CAPS: 200.0000 mg | ORAL_CAPSULE | Freq: Three times a day (TID) | ORAL | Status: DC | PRN

## 2014-12-24 NOTE — Patient Instructions (Signed)

## 2014-12-24 NOTE — Progress Notes (Signed)
Pre visit review using our clinic review tool, if applicable. No additional management support is needed unless otherwise documented below in the visit note. 

## 2014-12-24 NOTE — Progress Notes (Signed)
   Subjective:    Patient ID: Natasha Frederick, female    DOB: [birth year], 59 y.o.   MRN: [id number]  HPI Patient seen with cough for approximate one month. Quit smoking 1995. She had initially productive cough but over the past few days this has been dry. She's denies any dyspnea. No fevers or chills. No hemoptysis. No appetite or weight changes. No GERD symptoms. Does have occasional postnasal drip symptoms. Not aware of any wheezing.  Past Medical History  Diagnosis Date  . Endometriosis   . Arthritis     hip  . Scoliosis    Past Surgical History  Procedure Laterality Date  . Tonsillectomy  1958  . Oophorectomy  1977    left  . Appendectomy  1963  . Tubal ligation  1986  . Colonoscopy w/ polypectomy    . Planters wart Left 2005  . Anterior lat lumbar fusion Left 01/05/2014    Procedure: LUMBAR TWO TO THREE, LUMBAR LUMBAR THREE TO FOUR, ANTERIOR LATERAL LUMBAR FUSION 2 LEVELS;  Surgeon: Reinaldo Meeker, MD;  Location: [facility];  Service: Neurosurgery;  Laterality: Left;  . Radial keratoto Bilateral   . Breast surgery Left 1987    bx  . Eye surgery    . Total hip arthroplasty Left 07/19/2014    Procedure: TOTAL HIP ARTHROPLASTY;  Surgeon: Gean Birchwood, MD;  Location: [facility] OR;  Service: Orthopedics;  Laterality: Left;    reports that she quit smoking about 20 years ago. Her smoking use included Cigarettes. She has a 8 pack-year smoking history. She has never used smokeless tobacco. She reports that she drinks about 3.6 oz of alcohol per week. She reports that she does not use illicit drugs. family history is negative for Arthritis, Cancer, and Heart disease. Allergies  Allergen Reactions  . Bee Venom Anaphylaxis and Swelling  . Adhesive [Tape] Other (See Comments)    redness  . Penicillins Hives  . Sulfa Antibiotics Hives      Review of Systems  Constitutional: Negative for fever, chills, appetite change and unexpected weight change.  HENT: Positive for postnasal drip. Negative  for congestion.   Respiratory: Positive for cough.   Cardiovascular: Negative for chest pain.       Objective:   Physical Exam  Constitutional: She appears well-developed and well-nourished.  HENT:  Mouth/Throat: Oropharynx is clear and moist.  Neck: Neck supple. No thyromegaly present.  Cardiovascular: Normal rate and regular rhythm.   Pulmonary/Chest: Effort normal and breath sounds normal. No respiratory distress. She has no wheezes. She has no rales.  Musculoskeletal: She exhibits no edema.  Lymphadenopathy:    She has no cervical adenopathy.          Assessment & Plan:  Cough. Suspect resolving viral bronchitis. Nonfocal exam. Tessalon Perles 200 mg every 8 hours as needed for cough. Follow-up for any dyspnea, persistent cough, or any other new symptoms

## 2015-01-20 ENCOUNTER — Emergency Department (HOSPITAL_COMMUNITY): Payer: No Typology Code available for payment source

## 2015-01-20 ENCOUNTER — Encounter (HOSPITAL_COMMUNITY): Payer: Self-pay

## 2015-01-20 ENCOUNTER — Emergency Department (HOSPITAL_COMMUNITY)
Admission: EM | Admit: 2015-01-20 | Discharge: 2015-01-20 | Disposition: A | Discharge status: Home / Self Care | Payer: No Typology Code available for payment source | Attending: Emergency Medicine | Admitting: Emergency Medicine

## 2015-01-20 DIAGNOSIS — Z9889 Other specified postprocedural states: Secondary | ICD-10-CM | POA: Diagnosis not present

## 2015-01-20 DIAGNOSIS — Z87891 Personal history of nicotine dependence: Secondary | ICD-10-CM | POA: Insufficient documentation

## 2015-01-20 DIAGNOSIS — Z880 Allergy status to penicillin: Secondary | ICD-10-CM | POA: Insufficient documentation

## 2015-01-20 DIAGNOSIS — S3992XA Unspecified injury of lower back, initial encounter: Secondary | ICD-10-CM | POA: Insufficient documentation

## 2015-01-20 DIAGNOSIS — Y92410 Unspecified street and highway as the place of occurrence of the external cause: Secondary | ICD-10-CM | POA: Diagnosis not present

## 2015-01-20 DIAGNOSIS — Y9389 Activity, other specified: Secondary | ICD-10-CM | POA: Diagnosis not present

## 2015-01-20 DIAGNOSIS — Y998 Other external cause status: Secondary | ICD-10-CM | POA: Diagnosis not present

## 2015-01-20 MED ORDER — CYCLOBENZAPRINE HCL 10 MG PO TABS: 5.0000 mg | ORAL_TABLET | Freq: Three times a day (TID) | ORAL | Status: DC | PRN

## 2015-01-20 MED ORDER — CYCLOBENZAPRINE HCL 10 MG PO TABS
5.0000 mg | ORAL_TABLET | Freq: Once | ORAL | Status: AC
  Administered 2015-01-20: 5 mg via ORAL
  Filled 2015-01-20: qty 1
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
## 2015-01-20 NOTE — ED Notes (Signed)
MD at the bedside  
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
## 2015-12-27 ENCOUNTER — Encounter (HOSPITAL_COMMUNITY): Payer: Self-pay

## 2016-06-26 ENCOUNTER — Ambulatory Visit: Payer: Self-pay | Admitting: Family Medicine

## 2016-12-17 ENCOUNTER — Ambulatory Visit (INDEPENDENT_AMBULATORY_CARE_PROVIDER_SITE_OTHER): Payer: Federal, State, Local not specified - PPO | Admitting: *Deleted

## 2016-12-17 DIAGNOSIS — Z23 Encounter for immunization: Secondary | ICD-10-CM

## 2017-09-30 ENCOUNTER — Telehealth: Payer: Self-pay | Admitting: *Deleted

## 2017-09-30 NOTE — Telephone Encounter (Signed)
Copied from CRM [id number]. Topic: Inquiry >> Sep 30, 2017  2:23 PM Yvonna Alanisobinson, Andra M wrote: Reason for CRM: Patient's husband called stating that the patient will need some motion sickness patches for their trip they are taking in a few weeks. Please call patient and her husband at [phone].     Thank You!!!

## 2017-09-30 NOTE — Telephone Encounter (Signed)
Needs follow up. May send in Scopolamine patch one patch behind ear every 72 hours prn motion sickness prevention #9

## 2017-10-01 NOTE — Telephone Encounter (Signed)
Left message for patient to call back  

## 2017-10-02 MED ORDER — SCOPOLAMINE 1 MG/3DAYS TD PT72: 1.0000 | MEDICATED_PATCH | TRANSDERMAL | 0 refills | Status: DC | PRN

## 2017-10-02 NOTE — Telephone Encounter (Signed)
Patient returned call and scheduled appt via [facility] (our phones are down). Medication filled to pharmacy as requested.

## 2017-10-07 ENCOUNTER — Ambulatory Visit: Payer: Federal, State, Local not specified - PPO | Admitting: Family Medicine

## 2017-10-09 ENCOUNTER — Encounter: Payer: Self-pay | Admitting: Family Medicine

## 2017-10-09 ENCOUNTER — Ambulatory Visit (INDEPENDENT_AMBULATORY_CARE_PROVIDER_SITE_OTHER): Payer: Federal, State, Local not specified - PPO | Admitting: Family Medicine

## 2017-10-09 VITALS — BP 110/70 | HR 78 | Temp 97.7°F | Ht 64.0 in | Wt 132.7 lb

## 2017-10-09 DIAGNOSIS — Z0000 Encounter for general adult medical examination without abnormal findings: Secondary | ICD-10-CM

## 2017-10-09 DIAGNOSIS — Z9181 History of falling: Secondary | ICD-10-CM

## 2017-10-09 DIAGNOSIS — R259 Unspecified abnormal involuntary movements: Secondary | ICD-10-CM

## 2017-10-09 DIAGNOSIS — Z23 Encounter for immunization: Secondary | ICD-10-CM

## 2017-10-09 DIAGNOSIS — R269 Unspecified abnormalities of gait and mobility: Secondary | ICD-10-CM | POA: Diagnosis not present

## 2017-10-09 LAB — LIPID PANEL
Cholesterol: 200 mg/dL (ref 0–200)
HDL: 68.7 mg/dL (ref >39.00)
LDL Cholesterol: 108 mg/dL — ABNORMAL HIGH (ref 0–99)
NonHDL: 130.84
Total CHOL/HDL Ratio: 3
Triglycerides: 113 mg/dL (ref 0.0–149.0)
VLDL: 22.6 mg/dL (ref 0.0–40.0)

## 2017-10-09 LAB — CBC WITH DIFFERENTIAL/PLATELET
Basophils Absolute: 0.1 10*3/uL (ref 0.0–0.1)
Basophils Relative: 1.1 % (ref 0.0–3.0)
Eosinophils Absolute: 0.2 10*3/uL (ref 0.0–0.7)
Eosinophils Relative: 3.6 % (ref 0.0–5.0)
HCT: 37.2 % (ref 36.0–46.0)
Hemoglobin: 12.4 g/dL (ref 12.0–15.0)
Lymphocytes Relative: 17.2 % (ref 12.0–46.0)
Lymphs Abs: 1.1 10*3/uL (ref 0.7–4.0)
MCHC: 33.4 g/dL (ref 30.0–36.0)
MCV: 88.2 fl (ref 78.0–100.0)
Monocytes Absolute: 0.4 10*3/uL (ref 0.1–1.0)
Monocytes Relative: 6.3 % (ref 3.0–12.0)
Neutro Abs: 4.7 10*3/uL (ref 1.4–7.7)
Neutrophils Relative %: 71.8 % (ref 43.0–77.0)
Platelets: 290 10*3/uL (ref 150.0–400.0)
RBC: 4.22 Mil/uL (ref 3.87–5.11)
RDW: 13.4 % (ref 11.5–15.5)
WBC: 6.6 10*3/uL (ref 4.0–10.5)

## 2017-10-09 LAB — BASIC METABOLIC PANEL
BUN: 17 mg/dL (ref 6–23)
CO2: 23 mEq/L (ref 19–32)
Calcium: 9.7 mg/dL (ref 8.4–10.5)
Chloride: 107 mEq/L (ref 96–112)
Creatinine, Ser: 0.57 mg/dL (ref 0.40–1.20)
GFR: 113.35 mL/min (ref >60.00)
Glucose, Bld: 89 mg/dL (ref 70–99)
Potassium: 3.9 mEq/L (ref 3.5–5.1)
Sodium: 141 mEq/L (ref 135–145)

## 2017-10-09 LAB — HEPATIC FUNCTION PANEL
ALT: 9 U/L (ref 0–35)
AST: 11 U/L (ref 0–37)
Albumin: 4.5 g/dL (ref 3.5–5.2)
Alkaline Phosphatase: 48 U/L (ref 39–117)
Bilirubin, Direct: 0.1 mg/dL (ref 0.0–0.3)
Total Bilirubin: 0.5 mg/dL (ref 0.2–1.2)
Total Protein: 6.7 g/dL (ref 6.0–8.3)

## 2017-10-09 LAB — TSH: TSH: 2.52 u[IU]/mL (ref 0.35–4.50)

## 2017-10-09 NOTE — Patient Instructions (Signed)
You received tetanus vaccine today- which is good for 10 years  Set up repeat mammogram  Confirm date of last colonoscopy  Consider new shingles vaccine (Shingrix) and check at pharmacy if interested.  We will call you with labs done today.  We will set up neurology referral.

## 2017-10-09 NOTE — Progress Notes (Signed)
Subjective:     Patient ID: Natasha Frederick, female   DOB: 02/27/1956, 62 y.o.   MRN: [id number]  HPI Patient seen for physical exam. She has history osteoarthritis of the hip with previous hip replacement and also some scoliosis and osteoarthritis of lumbar spine. She's had previous back fusion surgery. Still has some intermittent back pain at times. Patient has not been seen here in a few years. She has seen gynecologist but apparently this was about 2 years ago.   She is unsure of date of last mammogram. She is due for tetanus booster. Hepatitis C status unknown. She states she had colonoscopy about 3 years ago and we have no record. She had previous Zostavax 2012. Quit smoking 1995.  She takes no medications currently other than baby aspirin. No history of CAD or cerebrovascular disease.  Family history reviewed. She states her mother had lung cancer. She states her father had "dementia". She is not sure what type. On further questioning patient she is fairly sure that her paternal grandmother as well as 2 aunts and 1 uncle (all paternal) may have had Huntington's disease.  Patient has 1 brother who does not have any known neurologic disorder  Past Medical History:  Diagnosis Date  . Arthritis    hip  . Endometriosis   . Scoliosis    Past Surgical History:  Procedure Laterality Date  . ANTERIOR LAT LUMBAR FUSION Left 01/05/2014   Procedure: LUMBAR TWO TO THREE, LUMBAR LUMBAR THREE TO FOUR, ANTERIOR LATERAL LUMBAR FUSION 2 LEVELS;  Surgeon: Reinaldo Meekerandy O Kritzer, MD;  Location: [facility];  Service: Neurosurgery;  Laterality: Left;  . APPENDECTOMY  1963  . BACK SURGERY    . BREAST SURGERY Left 1987   bx  . COLONOSCOPY W/ POLYPECTOMY    . EYE SURGERY    . OOPHORECTOMY  1977   left  . Planters wart Left 2005  . radial keratoto Bilateral   . TONSILLECTOMY  1958  . TOTAL HIP ARTHROPLASTY Left 07/19/2014   Procedure: TOTAL HIP ARTHROPLASTY;  Surgeon: Gean BirchwoodFrank Rowan, MD;  Location: [facility];  Service:  Orthopedics;  Laterality: Left;  . TOTAL HIP ARTHROPLASTY     Left   . TUBAL LIGATION  1986    reports that she quit smoking about 23 years ago. Her smoking use included cigarettes. She has a 8.00 pack-year smoking history. She has never used smokeless tobacco. She reports that she drinks alcohol. She reports that she does not use drugs. family history is not on file. Allergies  Allergen Reactions  . Bee Venom Anaphylaxis and Swelling  . Adhesive [Tape] Other (See Comments)    redness  . Adhesive [Tape]   . Bee Venom   . Codeine   . Penicillins Hives  . Penicillins   . Sulfa Antibiotics Hives  . Sulfa Antibiotics      Review of Systems  Constitutional: Negative for appetite change, chills, fatigue, fever and unexpected weight change.  Respiratory: Negative for cough and shortness of breath.   Cardiovascular: Negative for chest pain, palpitations and leg swelling.  Gastrointestinal: Negative for abdominal pain, nausea and vomiting.  Genitourinary: Negative for dysuria.  Neurological: Negative for dizziness, seizures, syncope, speech difficulty, weakness and headaches.       Objective:   Physical Exam  Constitutional: She is oriented to person, place, and time. She appears well-developed and well-nourished.  HENT:  Mouth/Throat: Oropharynx is clear and moist. No oropharyngeal exudate.  She has some cerumen in both ears  Eyes:  Extraocular movements are intact but she has some slowing of extraocular movements  Neck: Neck supple.  Cardiovascular: Normal rate and regular rhythm.  Pulmonary/Chest: Effort normal and breath sounds normal. She has no wheezes. She has no rales.  Breasts are symmetric with no mass (nurse chaperone present)  Abdominal: Soft. Bowel sounds are normal.  Musculoskeletal: She exhibits no edema.  Lymphadenopathy:    She has no cervical adenopathy.  Neurological: She is alert and oriented to person, place, and time. No cranial nerve deficit.  Patient  has some hyperreflexia. She has gait abnormality. She appears to have some chorea type movements which are brief and involuntary involving her extremities as well as head and neck and trunk.  Skin: No rash noted.  Psychiatric: She has a normal mood and affect. Her behavior is normal.  MMSE administered per nurse 29/30       Assessment:     #1 physical exam. Several health maintenance issues addressed as below  #2 patient has gait abnormality and evidence for progression of involuntary movements since last visit.  Possible family history of Huntington's disease in several of father's relatives.    Plan:     -Set up repeat mammogram -Tetanus booster given -Confirm date of last colonoscopy -Obtain screening labs including hepatitis C antibody -Patient will consider new shingles vaccine. She will check at pharmacy if interested -Set up neurology referral regarding her gait disorder. Concern is whether she could have genetic syndrome such as Huntington's disease.  High risk of falls, though she denies any falls during the past year -Consider discontinuing over-the-counter aspirin with no clear benefits to outweigh risk in her age class  Kristian Covey MD [facility]

## 2017-10-10 LAB — HEPATITIS C ANTIBODY
Hepatitis C Ab: NONREACTIVE
SIGNAL TO CUT-OFF: 0.01 (ref <1.00)

## 2017-10-11 NOTE — Progress Notes (Deleted)
Natasha Frederick was seen today in the movement disorders clinic for neurologic consultation at the request of Burchette, Elberta Fortis, MD.  The consultation is for the evaluation of abnormal gait and abnormal movements.  The records that were made available to me were reviewed.    Specific Symptoms:  Tremor: {yes no:314532} Family hx of similar:  {yes no:314532} Voice: *** Sleep: ***  Vivid Dreams:  {yes no:314532}  Acting out dreams:  {yes no:314532} Wet Pillows: {yes no:314532} Postural symptoms:  {yes no:314532}  Falls?  {yes no:314532} Bradykinesia symptoms: {parkinson brady:18041} Loss of smell:  {yes no:314532} Loss of taste:  {yes no:314532} Urinary Incontinence:  {yes no:314532} Difficulty Swallowing:  {yes no:314532} Handwriting, micrographia: {yes no:314532} Trouble with ADL's:  {yes no:314532}  Trouble buttoning clothing: {yes no:314532} Depression:  {yes no:314532} Memory changes:  {yes no:314532} Hallucinations:  {yes no:314532}  visual distortions: {yes no:314532} N/V:  {yes no:314532} Lightheaded:  {yes no:314532}  Syncope: {yes no:314532} Diplopia:  {yes no:314532} Dyskinesia:  {yes no:314532}  Neuroimaging of the brain has not previously been performed.    PREVIOUS MEDICATIONS: {Parkinson's RX:18200}  ALLERGIES:   Allergies  Allergen Reactions  . Bee Venom Anaphylaxis and Swelling  . Adhesive [Tape] Other (See Comments)    redness  . Adhesive [Tape]   . Bee Venom   . Codeine   . Penicillins Hives  . Penicillins   . Sulfa Antibiotics Hives  . Sulfa Antibiotics     CURRENT MEDICATIONS:  Outpatient Encounter Medications as of 10/14/2017  Medication Sig  . aspirin EC 325 MG tablet Take 1 tablet (325 mg total) by mouth 2 (two) times daily.  Marland Kitchen ibuprofen (ADVIL,MOTRIN) 800 MG tablet Take 800 mg by mouth every 8 (eight) hours as needed. for pain   No facility-administered encounter medications on file as of 10/14/2017.     PAST MEDICAL HISTORY:   Past  Medical History:  Diagnosis Date  . Arthritis    hip  . Endometriosis   . Scoliosis     PAST SURGICAL HISTORY:   Past Surgical History:  Procedure Laterality Date  . ANTERIOR LAT LUMBAR FUSION Left 01/05/2014   Procedure: LUMBAR TWO TO THREE, LUMBAR LUMBAR THREE TO FOUR, ANTERIOR LATERAL LUMBAR FUSION 2 LEVELS;  Surgeon: Reinaldo Meeker, MD;  Location: [facility];  Service: Neurosurgery;  Laterality: Left;  . APPENDECTOMY  1963  . BACK SURGERY    . BREAST SURGERY Left 1987   bx  . COLONOSCOPY W/ POLYPECTOMY    . EYE SURGERY    . OOPHORECTOMY  1977   left  . Planters wart Left 2005  . radial keratoto Bilateral   . TONSILLECTOMY  1958  . TOTAL HIP ARTHROPLASTY Left 07/19/2014   Procedure: TOTAL HIP ARTHROPLASTY;  Surgeon: Gean Birchwood, MD;  Location: [facility];  Service: Orthopedics;  Laterality: Left;  . TOTAL HIP ARTHROPLASTY     Left   . TUBAL LIGATION  1986    SOCIAL HISTORY:   Social History   Socioeconomic History  . Marital status: Married    Spouse name: Not on file  . Number of children: Not on file  . Years of education: Not on file  . Highest education level: Not on file  Occupational History  . Not on file  Social Needs  . Financial resource strain: Not on file  . Food insecurity:    Worry: Not on file    Inability: Not on file  . Transportation needs:  Medical: Not on file    Non-medical: Not on file  Tobacco Use  . Smoking status: Former Smoker    Packs/day: 1.00    Years: 8.00    Pack years: 8.00    Types: Cigarettes    Last attempt to quit: 01/17/1994    Years since quitting: 23.7  . Smokeless tobacco: Never Used  Substance and Sexual Activity  . Alcohol use: Yes    Comment: Occasionally  . Drug use: No  . Sexual activity: Not on file  Lifestyle  . Physical activity:    Days per week: Not on file    Minutes per session: Not on file  . Stress: Not on file  Relationships  . Social connections:    Talks on phone: Not on file    Gets  together: Not on file    Attends religious service: Not on file    Active member of club or organization: Not on file    Attends meetings of clubs or organizations: Not on file    Relationship status: Not on file  . Intimate partner violence:    Fear of current or ex partner: Not on file    Emotionally abused: Not on file    Physically abused: Not on file    Forced sexual activity: Not on file  Other Topics Concern  . Not on file  Social History Narrative   ** Merged History Encounter **        FAMILY HISTORY:   Family Status  Relation Name Status  . Neg Hx  (Not Specified)    ROS:  ROS  PHYSICAL EXAMINATION:    VITALS:  There were no vitals filed for this visit.  GEN:  The patient appears stated age and is in NAD. HEENT:  Normocephalic, atraumatic.  The mucous membranes are moist. The superficial temporal arteries are without ropiness or tenderness. CV:  RRR Lungs:  CTAB Neck/HEME:  There are no carotid bruits bilaterally.  Neurological examination:  Orientation: The patient is alert and oriented x3. Fund of knowledge is appropriate.  Recent and remote memory are intact.  Attention and concentration are normal.    Able to name objects and repeat phrases. Cranial nerves: There is good facial symmetry. Pupils are equal round and reactive to light bilaterally. Fundoscopic exam reveals clear margins bilaterally. Extraocular muscles are intact. The visual fields are full to confrontational testing. The speech is fluent and clear. Soft palate rises symmetrically and there is no tongue deviation. Hearing is intact to conversational tone. Sensation: Sensation is intact to light and pinprick throughout (facial, trunk, extremities). Vibration is intact at the bilateral big toe. There is no extinction with double simultaneous stimulation. There is no sensory dermatomal level identified. Motor: Strength is 5/5 in the bilateral upper and lower extremities.   Shoulder shrug is equal and  symmetric.  There is no pronator drift. Deep tendon reflexes: Deep tendon reflexes are 2/4 at the bilateral biceps, triceps, brachioradialis, patella and achilles. Plantar responses are downgoing bilaterally.  Movement examination: Tone: There is ***tone in the bilateral upper extremities.  The tone in the lower extremities is ***.  Abnormal movements: *** Coordination:  There is *** decremation with RAM's, *** Gait and Station: The patient has *** difficulty arising out of a deep-seated chair without the use of the hands. The patient's stride length is ***.  The patient has a *** pull test.        Chemistry      Component Value Date/Time  NA 141 10/09/2017 1023   K 3.9 10/09/2017 1023   CL 107 10/09/2017 1023   CO2 23 10/09/2017 1023   BUN 17 10/09/2017 1023   CREATININE 0.57 10/09/2017 1023      Component Value Date/Time   CALCIUM 9.7 10/09/2017 1023   ALKPHOS 48 10/09/2017 1023   AST 11 10/09/2017 1023   ALT 9 10/09/2017 1023   BILITOT 0.5 10/09/2017 1023     Lab Results  Component Value Date   WBC 6.6 10/09/2017   HGB 12.4 10/09/2017   HCT 37.2 10/09/2017   MCV 88.2 10/09/2017   PLT 290.0 10/09/2017   Lab Results  Component Value Date   TSH 2.52 10/09/2017     ASSESSMENT/PLAN:  ***    Cc:  Kristian Covey, MD

## 2017-10-14 ENCOUNTER — Ambulatory Visit: Payer: Federal, State, Local not specified - PPO | Admitting: Neurology

## 2017-10-14 NOTE — Progress Notes (Signed)
Natasha BeckmannJudith Frederick was seen today in the movement disorders clinic for neurologic consultation at the request of Burchette, Elberta FortisBruce W, MD.  The consultation is for the evaluation of abnormal gait and abnormal movements.  The records that were made available to me were reviewed.  This patient is accompanied in the office by her spouse who supplements the history.    Husband thinks that movements have been going on for a few years (pt thinks that few months).  It has gotten worse, as has gait and balance.    Specific Symptoms:  Family hx of similar:  Yes.  , fathers mother, fathers brother and fathers 2 sisters had HD Voice: no changes Postural symptoms:  Yes.    Falls?  Yes.  , 2 falls in a year per patient; 4-5 per husband Urinary Incontinence:  Yes.  , but just intermittent Difficulty Swallowing:  No. Trouble with ADL's:  Yes.  , relates to prior hx of back surgery.    Trouble buttoning clothing: No. Depression:  No. Memory changes:  No. per pt and husband (pt drives; husband does finances) Hallucinations:  No.  visual distortions: No. N/V:  No. Lightheaded:  Yes.  , rarely  Syncope: No. Diplopia:  No. Dyskinesia:  No.  Neuroimaging of the brain has not previously been performed.    PREVIOUS MEDICATIONS: none to date  ALLERGIES:   Allergies  Allergen Reactions  . Bee Venom Anaphylaxis and Swelling  . Adhesive [Tape] Other (See Comments)    redness  . Bee Venom   . Codeine   . Penicillins   . Sulfa Antibiotics     CURRENT MEDICATIONS:  Outpatient Encounter Medications as of 10/22/2017  Medication Sig  . ibuprofen (ADVIL,MOTRIN) 800 MG tablet Take 800 mg by mouth every 8 (eight) hours as needed. for pain  . [DISCONTINUED] aspirin EC 325 MG tablet Take 1 tablet (325 mg total) by mouth 2 (two) times daily.   No facility-administered encounter medications on file as of 10/22/2017.     PAST MEDICAL HISTORY:   Past Medical History:  Diagnosis Date  . Arthritis    hip  .  Endometriosis   . Scoliosis     PAST SURGICAL HISTORY:   Past Surgical History:  Procedure Laterality Date  . ANTERIOR LAT LUMBAR FUSION Left 01/05/2014   Procedure: LUMBAR TWO TO THREE, LUMBAR LUMBAR THREE TO FOUR, ANTERIOR LATERAL LUMBAR FUSION 2 LEVELS;  Surgeon: Reinaldo Meekerandy O Kritzer, MD;  Location: [facility];  Service: Neurosurgery;  Laterality: Left;  . APPENDECTOMY  1963  . BACK SURGERY    . BREAST SURGERY Left 1987   bx  . COLONOSCOPY W/ POLYPECTOMY    . EYE SURGERY    . OOPHORECTOMY  1977   left  . Planters wart Left 2005  . radial keratoto Bilateral   . TONSILLECTOMY  1958  . TOTAL HIP ARTHROPLASTY Left 07/19/2014   Procedure: TOTAL HIP ARTHROPLASTY;  Surgeon: Gean BirchwoodFrank Rowan, MD;  Location: [facility];  Service: Orthopedics;  Laterality: Left;  . TUBAL LIGATION  1986    SOCIAL HISTORY:   Social History   Socioeconomic History  . Marital status: Married    Spouse name: Not on file  . Number of children: Not on file  . Years of education: Not on file  . Highest education level: Not on file  Occupational History  . Occupation: unemployed    Comment: disabled back pain  Social Needs  . Financial resource strain: Not on file  .  Food insecurity:    Worry: Not on file    Inability: Not on file  . Transportation needs:    Medical: Not on file    Non-medical: Not on file  Tobacco Use  . Smoking status: Former Smoker    Packs/day: 1.00    Years: 8.00    Pack years: 8.00    Types: Cigarettes    Last attempt to quit: 01/17/1994    Years since quitting: 23.7  . Smokeless tobacco: Never Used  Substance and Sexual Activity  . Alcohol use: Yes    Alcohol/week: 1.8 oz    Types: 3 Cans of beer per week    Comment: per week  . Drug use: No  . Sexual activity: Not on file  Lifestyle  . Physical activity:    Days per week: Not on file    Minutes per session: Not on file  . Stress: Not on file  Relationships  . Social connections:    Talks on phone: Not on file    Gets  together: Not on file    Attends religious service: Not on file    Active member of club or organization: Not on file    Attends meetings of clubs or organizations: Not on file    Relationship status: Not on file  . Intimate partner violence:    Fear of current or ex partner: Not on file    Emotionally abused: Not on file    Physically abused: Not on file    Forced sexual activity: Not on file  Other Topics Concern  . Not on file  Social History Narrative   ** Merged History Encounter **        FAMILY HISTORY:   Family Status  Relation Name Status  . Mother  Deceased  . Father  Deceased  . Brother  Alive  . PGM  Deceased  . Emelda Brothers  Deceased  . Oneal Grout x2 Deceased  . Neg Hx  (Not Specified)    ROS:  Review of Systems  Constitutional: Negative.   HENT: Negative.   Gastrointestinal: Negative.   Genitourinary: Negative.   Musculoskeletal: Positive for back pain and falls.  Skin: Negative.   Neurological: Positive for weakness (legs, left more than right due to previous back surgery).  Endo/Heme/Allergies: Negative.   Psychiatric/Behavioral: Negative.     PHYSICAL EXAMINATION:    VITALS:   Vitals:   10/22/17 1340  BP: 138/70  Pulse: 68  SpO2: 96%  Weight: 133 lb (60.3 kg)  Height: 5\' 4"  (1.626 m)    GEN:  The patient appears stated age and is in NAD. HEENT:  Normocephalic, atraumatic.  The mucous membranes are moist. The superficial temporal arteries are without ropiness or tenderness. CV:  RRR Lungs:  CTAB Neck/HEME:  There are no carotid bruits bilaterally.  Neurological examination:  Orientation: The patient is alert and oriented x3. Fund of knowledge is appropriate.  Recent and remote memory are intact.  Attention and concentration are normal.    Able to name objects and repeat phrases. Cranial nerves: There is good facial symmetry. Pupils are equal round and reactive to light bilaterally. Fundoscopic exam reveals clear margins bilaterally. Extraocular  muscles are intact. The visual fields are full to confrontational testing. The speech is fluent and clear.  Some trouble with gutteral sounds.  Soft palate rises symmetrically and there is no tongue deviation. Hearing is intact to conversational tone. Sensation: Sensation is intact to light and pinprick throughout (facial, trunk,  extremities). Vibration is intact at the bilateral big toe. There is no extinction with double simultaneous stimulation. There is no sensory dermatomal level identified. Motor: Strength is 5/5 in the bilateral upper and lower extremities.  She does have trouble with hand grasp but some is due to excess movement.   Shoulder shrug is equal and symmetric.  There is no pronator drift. Deep tendon reflexes: Deep tendon reflexes are 3/4 at the bilateral biceps, triceps, brachioradialis, patella and achilles. Plantar responses are downgoing bilaterally.  Movement examination: Tone: There is normal tone in the upper and lower extremities. Abnormal movements: There is choreiform movements in the arms and legs.  At times, there is more ballistic movements in the arms.  These are more rare. Coordination:  There is no decremation with RAM's, with any form of RAMS, including alternating supination and pronation of the forearm, hand opening and closing, finger taps, heel taps and toe taps.  Some ataxia is noted with FNF due to excess movements Gait and Station: The patient has difficulty arising out of a deep-seated chair without the use of the hands.  She pushes off of the chair.  She is quite ataxic when walking.  She is unsteady.      Chemistry      Component Value Date/Time   NA 141 10/09/2017 1023   K 3.9 10/09/2017 1023   CL 107 10/09/2017 1023   CO2 23 10/09/2017 1023   BUN 17 10/09/2017 1023   CREATININE 0.57 10/09/2017 1023      Component Value Date/Time   CALCIUM 9.7 10/09/2017 1023   ALKPHOS 48 10/09/2017 1023   AST 11 10/09/2017 1023   ALT 9 10/09/2017 1023   BILITOT  0.5 10/09/2017 1023     Lab Results  Component Value Date   WBC 6.6 10/09/2017   HGB 12.4 10/09/2017   HCT 37.2 10/09/2017   MCV 88.2 10/09/2017   PLT 290.0 10/09/2017   Lab Results  Component Value Date   TSH 2.52 10/09/2017     ASSESSMENT/PLAN:  1.  Chorea and occasional ballistic movements  -Patient is also quite ataxic with ambulation.  Given strong family history of Huntington's disease, will go ahead and do a Huntington's panel looking for CAG repeats.  We will hold on other extensive blood work and neuroimaging right now, as I think that it is most useful to do the genetic testing.  They asked me about various treatments and we cursorily discussed those, but talked about the fact that it was most necessary to do the testing first.  Safety was discussed in detail.  We will follow-up with her following testing results. May need to pursue neurocognitive testing at that visit.      Cc:  Kristian Covey, MD

## 2017-10-22 ENCOUNTER — Encounter: Payer: Self-pay | Admitting: Neurology

## 2017-10-22 ENCOUNTER — Ambulatory Visit: Payer: Federal, State, Local not specified - PPO | Admitting: Neurology

## 2017-10-22 VITALS — BP 138/70 | HR 68 | Ht 64.0 in | Wt 133.0 lb

## 2017-10-22 DIAGNOSIS — G255 Other chorea: Secondary | ICD-10-CM

## 2017-10-22 DIAGNOSIS — W19XXXA Unspecified fall, initial encounter: Secondary | ICD-10-CM

## 2017-10-22 DIAGNOSIS — Z820 Family history of epilepsy and other diseases of the nervous system: Secondary | ICD-10-CM

## 2017-10-28 ENCOUNTER — Telehealth: Payer: Self-pay | Admitting: Neurology

## 2017-10-28 NOTE — Telephone Encounter (Signed)
Rickard RhymesLewis Frederick [phone]  Natasha called to see what Labs Bosie ClosJudith was needing so he could feel out the paperwork correctly. Please call back and advise.

## 2017-10-28 NOTE — Telephone Encounter (Signed)
Patient's husband made aware of [facility] testing that was ordered.

## 2017-11-05 ENCOUNTER — Telehealth: Payer: Self-pay | Admitting: Neurology

## 2017-11-05 NOTE — Telephone Encounter (Signed)
[facility] called to see if a Fax was received from them with Positive results. She said if they were not received to please call [phone] Option 2. Thanks

## 2017-11-05 NOTE — Telephone Encounter (Signed)
Results received and given to Dr. Arbutus Leasat for review.

## 2017-11-11 ENCOUNTER — Telehealth: Payer: Self-pay | Admitting: Neurology

## 2017-11-11 NOTE — Telephone Encounter (Signed)
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

## 2017-11-22 ENCOUNTER — Telehealth: Payer: Self-pay | Admitting: Neurology

## 2017-11-22 ENCOUNTER — Encounter: Payer: Self-pay | Admitting: Neurology

## 2017-11-22 NOTE — Telephone Encounter (Signed)
Letter of Medical Necessity and records faxed to [facility] per their request for Huntington's panel at [phone] with confirmation received.  

## 2017-11-26 NOTE — Progress Notes (Signed)
Natasha Frederick was seen today in follow-up for her genetic testing for Huntington's disease.  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  Patient continues to have abnormal movements.  She has had no falls since our last visit and "I think my balance is a bit better."    Mood has been okay.  There is no suicidal or homicidal ideation.  PREVIOUS MEDICATIONS: none to date  ALLERGIES:   Allergies  Allergen Reactions  . Bee Venom Anaphylaxis and Swelling  . Adhesive [Tape] Other (See Comments)    redness  . Bee Venom   . Codeine   . Penicillins   . Sulfa Antibiotics     CURRENT MEDICATIONS:  Outpatient Encounter Medications as of 11/29/2017  Medication Sig  . ibuprofen (ADVIL,MOTRIN) 800 MG tablet Take 800 mg by mouth every 8 (eight) hours as needed. for pain   No facility-administered encounter medications on file as of 11/29/2017.     PAST MEDICAL HISTORY:   Past Medical History:  Diagnosis Date  . Arthritis    hip  . Endometriosis   . Scoliosis     PAST SURGICAL HISTORY:   Past Surgical History:  Procedure Laterality Date  . ANTERIOR LAT LUMBAR FUSION Left 01/05/2014   Procedure: LUMBAR TWO TO THREE, LUMBAR LUMBAR THREE TO FOUR, ANTERIOR LATERAL LUMBAR FUSION 2 LEVELS;  Surgeon: Faythe Ghee, MD;  Location: [facility];  Service: Neurosurgery;  Laterality: Left;  . APPENDECTOMY  1963  . BACK SURGERY    . BREAST SURGERY Left 1987   bx  . COLONOSCOPY W/ POLYPECTOMY    . EYE SURGERY    . OOPHORECTOMY  1977   left  . Planters wart Left 2005  . radial keratoto Bilateral   . TONSILLECTOMY  1958  . TOTAL HIP ARTHROPLASTY Left 07/19/2014   Procedure: TOTAL HIP ARTHROPLASTY;  Surgeon: Frederik Pear, MD;  Location: [facility];  Service: Orthopedics;  Laterality: Left;  . TUBAL LIGATION  1986    SOCIAL HISTORY:   Social History   Socioeconomic History  . Marital status: Married    Spouse name: Not on file  . Number of children: Not on file  .  Years of education: Not on file  . Highest education level: Not on file  Occupational History  . Occupation: unemployed    Comment: disabled back pain  Social Needs  . Financial resource strain: Not on file  . Food insecurity:    Worry: Not on file    Inability: Not on file  . Transportation needs:    Medical: Not on file    Non-medical: Not on file  Tobacco Use  . Smoking status: Former Smoker    Packs/day: 1.00    Years: 8.00    Pack years: 8.00    Types: Cigarettes    Last attempt to quit: 01/17/1994    Years since quitting: 23.8  . Smokeless tobacco: Never Used  Substance and Sexual Activity  . Alcohol use: Yes    Alcohol/week: 3.0 standard drinks    Types: 3 Cans of beer per week    Comment: per week  . Drug use: No  . Sexual activity: Not on file  Lifestyle  . Physical activity:    Days per week: Not on file    Minutes per session: Not on file  . Stress: Not on file  Relationships  . Social connections:    Talks on phone: Not on file  Gets together: Not on file    Attends religious service: Not on file    Active member of club or organization: Not on file    Attends meetings of clubs or organizations: Not on file    Relationship status: Not on file  . Intimate partner violence:    Fear of current or ex partner: Not on file    Emotionally abused: Not on file    Physically abused: Not on file    Forced sexual activity: Not on file  Other Topics Concern  . Not on file  Social History Narrative   ** Merged History Encounter **        FAMILY HISTORY:   Family Status  Relation Name Status  . Mother  Deceased  . Father  Deceased  . Brother  Alive  . PGM  Deceased  . Ethlyn Daniels  Deceased  . Annamarie Major x2 Deceased  . Neg Hx  (Not Specified)    ROS:  Review of Systems  Constitutional: Negative.   HENT: Negative.   Gastrointestinal: Negative.   Genitourinary: Negative.   Musculoskeletal: Positive for back pain and falls.  Skin: Negative.     Neurological: Positive for weakness (legs, left more than right due to previous back surgery).  Endo/Heme/Allergies: Negative.   Psychiatric/Behavioral: Negative.     PHYSICAL EXAMINATION:    VITALS:   Vitals:   11/29/17 0947  BP: 120/70  Pulse: 65  SpO2: 96%  Weight: 131 lb 2 oz (59.5 kg)  Height: '5\' 4"'  (1.626 m)    Examination not completed.  Virtually 100% of visit in counseling.  Did have choreiform movements and ballistic movements in arms and legs    Chemistry      Component Value Date/Time   NA 141 10/09/2017 1023   K 3.9 10/09/2017 1023   CL 107 10/09/2017 1023   CO2 23 10/09/2017 1023   BUN 17 10/09/2017 1023   CREATININE 0.57 10/09/2017 1023      Component Value Date/Time   CALCIUM 9.7 10/09/2017 1023   ALKPHOS 48 10/09/2017 1023   AST 11 10/09/2017 1023   ALT 9 10/09/2017 1023   BILITOT 0.5 10/09/2017 1023     Lab Results  Component Value Date   WBC 6.6 10/09/2017   HGB 12.4 10/09/2017   HCT 37.2 10/09/2017   MCV 88.2 10/09/2017   PLT 290.0 10/09/2017   Lab Results  Component Value Date   TSH 2.52 10/09/2017     ASSESSMENT/PLAN:  1.  Huntington Disease             -Long discussion with the patient today regarding this diagnosis.  Genetic testing was positive.  Talked about the implications for her family, particularly her children.  Talked about the fact that this is an autosomal dominant disease.  Talked about the fact that over the long-term, this can involve more than just abnormal movements.  This can involve mood and cognitive changes.  This can also affect swallowing and ability to ambulate.  This can also affect speech.  Talked about the fact that medications specifically for HD often only help the chorea.  Discussed risks of these medications.  Discussed risk of QT prolongation.  Would recommend that we initiate Austedo, if insurance allows. Reports that she is very bothered by movement. This medication is titrated to efficacy.  Starts with 6  mg daily and then can increase weekly (at which point it would be twice daily dosing) to a maximum of 48  mg/day total (36 mg/day total split 18 mg twice per day if poor CYP 2 D6 metabolizer or if starts in medication such as Paxil, fluoxetine, bupropion, etc. that are strong inhibitors of CYP 2 D6).  Understands that Austedo only helps movement and not progression of disease.  -discussed community resources and support groups, including HD REACH.   -met with my social worker today  -discussed living will which they apparently already have.  Desires DNR.  Forms filled out today.  2.  F/u 2-3 months.  Much greater than 50% of this visit was spent in counseling and coordinating care.  Total face to face time:  40 min     Cc:  Eulas Post, MD

## 2017-11-29 ENCOUNTER — Encounter: Payer: Self-pay | Admitting: Psychology

## 2017-11-29 ENCOUNTER — Ambulatory Visit: Payer: Federal, State, Local not specified - PPO | Admitting: Neurology

## 2017-11-29 ENCOUNTER — Encounter: Payer: Self-pay | Admitting: Neurology

## 2017-11-29 VITALS — BP 120/70 | HR 65 | Ht 64.0 in | Wt 131.1 lb

## 2017-11-29 DIAGNOSIS — G10 Huntington's disease: Secondary | ICD-10-CM | POA: Insufficient documentation

## 2017-11-29 NOTE — Progress Notes (Signed)
I met with the patient and her husband in the clinic today.  We carefully reviewed resources for Huntington's disease.  I provided information on the Huntington's reach program and focused on the monthly support group that is held in [place]. I also shared information on the telephonic support group that is weekly.  I shared resources from the National Huntington's disease organization and it shared information on the upcoming walk in October and the Parkinson's education day that is being held in [place] in September.  We talked a little bit about grief and loss and  resources were provided on the anticipatory grief counseling program with hospice and palliative care in [place]. They were also given handouts: HD Family Guide and a HD Caregiver booklet.   We talked a little bit about things that are important to them as a couple and things that give him joy.  As a couple they like to go to boat races and are planning to attend one in the fall.  In addition, they like to go on cruises.  They have a cruise planned in December to go to [place].  We talked about the importance of focusing on the things that matter most to the patient and to them as a couple, enjoying moments together and having things right now that they can look forward to like their cruise in December  The patient was provided with 2 copies of her DNR. She will be brining the office a copy of her advanced directives. They have a copy of my contact information and I am more than happy to help them or meet with them again in the future. This is a lot of information for them to process at this time.

## 2017-12-03 ENCOUNTER — Telehealth: Payer: Self-pay | Admitting: Psychology

## 2017-12-03 NOTE — Telephone Encounter (Signed)
Patient's husband called and left a message indicating that he had a couple questions.  He left his home and his cell phone number.  I returned his telephone call and left messages on both lines.

## 2017-12-10 ENCOUNTER — Telehealth: Payer: Self-pay | Admitting: Neurology

## 2017-12-10 NOTE — Telephone Encounter (Signed)
Patient's husband called and left a voicemail stating that he needed to speak with [facility] regarding the patient. Please call him back at [phone]. Thanks.

## 2017-12-10 NOTE — Telephone Encounter (Signed)
Spoke with patient's husband. He wanted to know information on status of medication. I did let him know I faxed in the medication form on 12/03/17 and it was received. I gave him the number to reach out to them directly since they do not answer "1-800" numbers.   He read over all the information Shanda Bumps gave to them at their appt. They are attending a support group tonight and I let them know I think that is a great idea. Paperwork did talk about stages of the disease and the patient's husband wants to know which stage the patient is currently in. Please advise.

## 2017-12-10 NOTE — Telephone Encounter (Signed)
Patient's husband made aware.  

## 2017-12-10 NOTE — Telephone Encounter (Signed)
She is probably early to early intermediate staging but this is really academic information.  Staging of HD isn't based on objective signs but on subjective symptoms (for the most part).

## 2017-12-13 ENCOUNTER — Encounter: Payer: Self-pay | Admitting: Psychology

## 2017-12-13 ENCOUNTER — Ambulatory Visit: Payer: Federal, State, Local not specified - PPO | Admitting: Neurology

## 2017-12-13 ENCOUNTER — Encounter: Payer: Self-pay | Admitting: Neurology

## 2017-12-13 VITALS — BP 130/76 | HR 64 | Ht 64.0 in | Wt 132.0 lb

## 2017-12-13 DIAGNOSIS — M545 Low back pain: Secondary | ICD-10-CM

## 2017-12-13 DIAGNOSIS — G255 Other chorea: Secondary | ICD-10-CM | POA: Diagnosis not present

## 2017-12-13 DIAGNOSIS — G10 Huntington's disease: Secondary | ICD-10-CM | POA: Diagnosis not present

## 2017-12-13 DIAGNOSIS — G8929 Other chronic pain: Secondary | ICD-10-CM

## 2017-12-13 NOTE — Progress Notes (Signed)
I met with the patient and her husband while they were in the clinic today. They were able to attend the Huntington's Disease support group and connect with the resources provided. They know that I am available as needs come up. Today, they shared that they are looking forward to their cruise in December.

## 2017-12-13 NOTE — Progress Notes (Signed)
Natasha Frederick was seen today in follow-up for her genetic testing for Huntington's disease.  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  Patient continues to have abnormal movements.  She has had no falls since our last visit and "I think my balance is a bit better."    Mood has been okay.  There is no suicidal or homicidal ideation.  12/13/17 update: Patient seen for newly diagnosed Huntington's disease.  Intention last visit was to start her on Austedo.  Insurance company has not required AIMS score and she presents today for that evaluation.  Went to support group and that helped.  Planning to go to conference in [place] re: HD  PREVIOUS MEDICATIONS: none to date  ALLERGIES:   Allergies  Allergen Reactions  . Bee Venom Anaphylaxis and Swelling  . Adhesive [Tape] Other (See Comments)    redness  . Bee Venom   . Codeine   . Penicillins   . Sulfa Antibiotics     CURRENT MEDICATIONS:  Outpatient Encounter Medications as of 12/13/2017  Medication Sig  . ibuprofen (ADVIL,MOTRIN) 800 MG tablet Take 800 mg by mouth every 8 (eight) hours as needed. for pain   No facility-administered encounter medications on file as of 12/13/2017.     PAST MEDICAL HISTORY:   Past Medical History:  Diagnosis Date  . Arthritis    hip  . Endometriosis   . Scoliosis     PAST SURGICAL HISTORY:   Past Surgical History:  Procedure Laterality Date  . ANTERIOR LAT LUMBAR FUSION Left 01/05/2014   Procedure: LUMBAR TWO TO THREE, LUMBAR LUMBAR THREE TO FOUR, ANTERIOR LATERAL LUMBAR FUSION 2 LEVELS;  Surgeon: Reinaldo Meeker, MD;  Location: [facility];  Service: Neurosurgery;  Laterality: Left;  . APPENDECTOMY  1963  . BACK SURGERY    . BREAST SURGERY Left 1987   bx  . COLONOSCOPY W/ POLYPECTOMY    . EYE SURGERY    . OOPHORECTOMY  1977   left  . Planters wart Left 2005  . radial keratoto Bilateral   . TONSILLECTOMY  1958  . TOTAL HIP ARTHROPLASTY Left 07/19/2014   Procedure: TOTAL HIP ARTHROPLASTY;  Surgeon: Gean Birchwood, MD;  Location: [facility];  Service: Orthopedics;  Laterality: Left;  . TUBAL LIGATION  1986    SOCIAL HISTORY:   Social History   Socioeconomic History  . Marital status: Married    Spouse name: Not on file  . Number of children: Not on file  . Years of education: Not on file  . Highest education level: Not on file  Occupational History  . Occupation: unemployed    Comment: disabled back pain  Social Needs  . Financial resource strain: Not on file  . Food insecurity:    Worry: Not on file    Inability: Not on file  . Transportation needs:    Medical: Not on file    Non-medical: Not on file  Tobacco Use  . Smoking status: Former Smoker    Packs/day: 1.00    Years: 8.00    Pack years: 8.00    Types: Cigarettes    Last attempt to quit: 01/17/1994    Years since quitting: 23.9  . Smokeless tobacco: Never Used  Substance and Sexual Activity  . Alcohol use: Yes    Alcohol/week: 3.0 standard drinks    Types: 3 Cans of beer per week    Comment: per week  . Drug use: No  .  Sexual activity: Not on file  Lifestyle  . Physical activity:    Days per week: Not on file    Minutes per session: Not on file  . Stress: Not on file  Relationships  . Social connections:    Talks on phone: Not on file    Gets together: Not on file    Attends religious service: Not on file    Active member of club or organization: Not on file    Attends meetings of clubs or organizations: Not on file    Relationship status: Not on file  . Intimate partner violence:    Fear of current or ex partner: Not on file    Emotionally abused: Not on file    Physically abused: Not on file    Forced sexual activity: Not on file  Other Topics Concern  . Not on file  Social History Narrative   ** Merged History Encounter **        FAMILY HISTORY:   Family Status  Relation Name Status  . Mother  Deceased  . Father  Deceased  . Brother  Alive  .  PGM  Deceased  . Emelda Brothers  Deceased  . Oneal Grout x2 Deceased  . Neg Hx  (Not Specified)    ROS:  Review of Systems  Constitutional: Negative.   HENT: Negative.   Eyes: Negative.   Respiratory: Negative.   Cardiovascular: Negative.   Gastrointestinal: Negative.   Genitourinary: Negative.   Musculoskeletal: Positive for back pain (chronic) and joint pain (hip).  Skin: Negative.   Endo/Heme/Allergies: Negative.   Psychiatric/Behavioral: Positive for depression. The patient is nervous/anxious.     PHYSICAL EXAMINATION:    VITALS:   Vitals:   12/13/17 0850  BP: 130/76  Pulse: 64  Weight: 132 lb (59.9 kg)  Height: 5\' 4"  (1.626 m)   GEN:  The patient appears stated age and is in NAD. HEENT:  Normocephalic, atraumatic.  The mucous membranes are moist. The superficial temporal arteries are without ropiness or tenderness. CV:  RRR Lungs:  CTAB Neck/HEME:  There are no carotid bruits bilaterally.  Neurological examination:  Orientation: The patient is alert and oriented x3. Cranial nerves: There is good facial symmetry. The speech is fluent and clear. Soft palate rises symmetrically and there is no tongue deviation. Hearing is intact to conversational tone. Sensation: Sensation is intact to light touch throughout Motor: Strength is 5/5 in the bilateral upper and lower extremities.   Shoulder shrug is equal and symmetric.  There is no pronator drift.  Movement examination: Tone: There is normal tone in the UE/LE Abnormal movements: there is mild mouth movement.  Tongue does not protrude outside of the mouth.  There is choreiform movement of the arms and legs. Coordination:  There is slowness of rapid alternating movements, especially with toe taps bilaterally.   Gait and Station: The patient is ataxic.        Chemistry      Component Value Date/Time   NA 141 10/09/2017 1023   K 3.9 10/09/2017 1023   CL 107 10/09/2017 1023   CO2 23 10/09/2017 1023   BUN 17 10/09/2017 1023     CREATININE 0.57 10/09/2017 1023      Component Value Date/Time   CALCIUM 9.7 10/09/2017 1023   ALKPHOS 48 10/09/2017 1023   AST 11 10/09/2017 1023   ALT 9 10/09/2017 1023   BILITOT 0.5 10/09/2017 1023     Lab Results  Component Value Date  WBC 6.6 10/09/2017   HGB 12.4 10/09/2017   HCT 37.2 10/09/2017   MCV 88.2 10/09/2017   PLT 290.0 10/09/2017   Lab Results  Component Value Date   TSH 2.52 10/09/2017     ASSESSMENT/PLAN:  1.  Huntington Disease             -AIMS score/evaluation done today.  AIMS score of 19  -pt/family with many questions and answered to best of my ability  -pt has many resources re: HD including HD reach.  They went to support group and plan to go to HD conference in [place]  -depressed (reactive) but no SI/HI.  Still want to try austedo.  Will let me know if depression increases.   Starts with 6 mg daily and then can increase weekly (at which point it would be twice daily dosing) to a maximum of 48 mg/day total (36 mg/day total split 18 mg twice per day if poor CYP 2 D6 metabolizer or if starts in medication such as Paxil, fluoxetine, bupropion, etc. that are strong inhibitors of CYP 2 D6).  Understands that Austedo only helps movement and not progression of disease.  2.  Chronic low back pain  -Patient is currently taking ibuprofen, 800 mg 3 times per day every day.  Talked to her about potential consequences of that including GI bleeding and renal insufficiency.  I did ask her to make a follow-up with her primary care physician to discuss alternatives.  I also told her to try Tylenol instead of all of the ibuprofen.  3.  I will plan on seeing her back about 8 weeks after Austedo has been started, if [employer] approves that.  Much greater than 50% of this visit was spent in counseling and coordinating care.  Total face to face time:  25 min      Cc:  Kristian Covey, MD

## 2017-12-17 ENCOUNTER — Telehealth: Payer: Self-pay | Admitting: Neurology

## 2017-12-17 NOTE — Telephone Encounter (Signed)
Patient husband was wanting to talk with someone about the paperwork for [employer]

## 2017-12-17 NOTE — Telephone Encounter (Signed)
Patient's husband states they have not received paperwork from [place]. Made him aware I faxed on 12/13/17 at 10:43 am with confirmation. He was given the number to [facility] [phone] where this was faxed if he wanted to follow up, but aware it can take a few days to get an answer on prior authorizations.

## 2017-12-19 ENCOUNTER — Telehealth: Payer: Self-pay | Admitting: Neurology

## 2017-12-19 NOTE — Telephone Encounter (Signed)
Received approval for Austedo valid 11/16/17-12/16/2018. Faxed to [facility] [employer] at [phone] with confirmation received.

## 2018-01-15 ENCOUNTER — Telehealth: Payer: Self-pay | Admitting: Neurology

## 2018-01-15 NOTE — Telephone Encounter (Signed)
Patient's spouse called regarding a prescription refill and that she is out of Austedo? Thanks

## 2018-01-15 NOTE — Telephone Encounter (Signed)
Patient's husband called back and states she didn't get any benefit until she got to 12 mg BID and didn't want to restart at such a low dose.  I called [facility] they state they can get medication to them in 2 days, so we will start titration at 9 mg BID. Ok per Dr. Arbutus Leas. RX called to pharmacy as follows:  9 mg BID for 1 week 12 mg BID for 1 week 15 mg BID for 1 week 18 mg BID for 1 week  21 mg BID to stay here or stop titration when symptoms controlled.  Patient's husband to call and let us know. He was made aware of new RX starting dose sent to pharmacy.

## 2018-01-15 NOTE — Telephone Encounter (Signed)
Spoke with [employer] [facility] [phone]) and they had only sent out 3 weeks of Austedo to patient with the Tardive Dyskinesia titration and not HD. I advised them to start over with titration of HD since patient is currently out of medication and send enough for her to titrate for 7 weeks if needed. They will follow up with patient. We will decide on maintenance dose later after we see how she does on medication. I spoke with patient's husband and made him aware of this information.

## 2018-01-29 ENCOUNTER — Ambulatory Visit: Payer: Federal, State, Local not specified - PPO | Admitting: Neurology

## 2018-02-25 ENCOUNTER — Telehealth: Payer: Self-pay | Admitting: Neurology

## 2018-02-25 DIAGNOSIS — Z5181 Encounter for therapeutic drug level monitoring: Secondary | ICD-10-CM

## 2018-02-25 NOTE — Telephone Encounter (Signed)
Patient husband wants to talk to someone about wife medication please call

## 2018-02-25 NOTE — Telephone Encounter (Signed)
Patient's husband made aware.  EKG scheduled for [facility] on Wednesday at 2 pm. Patient's husband made aware of appointment. Order for EKG faxed to [facility] at [phone] with confirmation received.

## 2018-02-25 NOTE — Telephone Encounter (Signed)
Patient called and left a vm about an update on her RX. Please call her back at [phone]. Thanks!

## 2018-02-25 NOTE — Telephone Encounter (Signed)
I will need an EKG to look at QT interval before going higher.  Go ahead and order that at [facility] and then we will let them know if they can increase

## 2018-02-25 NOTE — Telephone Encounter (Signed)
Spoke with patient's husband.  Patient made it to Austedo 21 mg BID. She is still having some movements in the evening. They want to know if they can increase to 24 mg BID for the final maintenance dose? They are leaving on a cruise December 1st and want to have everything completed and medication received prior to this. Please advise.

## 2018-02-26 ENCOUNTER — Telehealth: Payer: Self-pay | Admitting: Neurology

## 2018-02-26 NOTE — Telephone Encounter (Signed)
Awaiting results of EKG and husband aware we will let him know about Austedo RX once we receive results.

## 2018-02-26 NOTE — Telephone Encounter (Signed)
Patient's husband called and is needing to let Dr. Arbutus Leasat know that Natasha Frederick had her EKG that was ordered today. She wanted Dr. Arbutus Leasat to be aware. Also, he mentioned a new prescription Austedo 24 MG and [facility]. Thanks

## 2018-02-27 ENCOUNTER — Other Ambulatory Visit: Payer: Self-pay | Admitting: Neurology

## 2018-02-27 MED ORDER — DEUTETRABENAZINE 12 MG PO TABS: 24.0000 mg | ORAL_TABLET | Freq: Two times a day (BID) | ORAL | 1 refills | Status: DC

## 2018-02-27 NOTE — Telephone Encounter (Signed)
Yes, they can increase.  QT was 414.

## 2018-02-27 NOTE — Telephone Encounter (Signed)
EKG received and placed on Dr. Don Perkingat's desk. Dr. Arbutus Leasat - please advise.

## 2018-02-27 NOTE — Telephone Encounter (Signed)
RX for Austedo maintenance dose of 24 mg BID sent to [facility].  Patient's husband made aware.

## 2018-03-12 ENCOUNTER — Ambulatory Visit: Payer: Federal, State, Local not specified - PPO | Admitting: Neurology

## 2018-04-22 ENCOUNTER — Ambulatory Visit: Payer: Federal, State, Local not specified - PPO | Admitting: Neurology

## 2018-06-17 DIAGNOSIS — M545 Low back pain: Secondary | ICD-10-CM | POA: Diagnosis not present

## 2018-06-17 DIAGNOSIS — G10 Huntington's disease: Secondary | ICD-10-CM | POA: Diagnosis not present

## 2018-06-25 DIAGNOSIS — M545 Low back pain: Secondary | ICD-10-CM | POA: Diagnosis not present

## 2018-06-25 DIAGNOSIS — R260 Ataxic gait: Secondary | ICD-10-CM | POA: Diagnosis not present

## 2018-06-26 DIAGNOSIS — M545 Low back pain: Secondary | ICD-10-CM | POA: Diagnosis not present

## 2018-06-26 DIAGNOSIS — R260 Ataxic gait: Secondary | ICD-10-CM | POA: Diagnosis not present

## 2018-06-30 DIAGNOSIS — R260 Ataxic gait: Secondary | ICD-10-CM | POA: Diagnosis not present

## 2018-06-30 DIAGNOSIS — M545 Low back pain: Secondary | ICD-10-CM | POA: Diagnosis not present

## 2018-07-03 DIAGNOSIS — R260 Ataxic gait: Secondary | ICD-10-CM | POA: Diagnosis not present

## 2018-07-03 DIAGNOSIS — M545 Low back pain: Secondary | ICD-10-CM | POA: Diagnosis not present

## 2018-07-07 DIAGNOSIS — R260 Ataxic gait: Secondary | ICD-10-CM | POA: Diagnosis not present

## 2018-07-07 DIAGNOSIS — M545 Low back pain: Secondary | ICD-10-CM | POA: Diagnosis not present

## 2018-07-08 DIAGNOSIS — R260 Ataxic gait: Secondary | ICD-10-CM | POA: Diagnosis not present

## 2018-07-08 DIAGNOSIS — G10 Huntington's disease: Secondary | ICD-10-CM | POA: Diagnosis not present

## 2018-07-10 DIAGNOSIS — R260 Ataxic gait: Secondary | ICD-10-CM | POA: Diagnosis not present

## 2018-07-10 DIAGNOSIS — M545 Low back pain: Secondary | ICD-10-CM | POA: Diagnosis not present

## 2018-07-14 DIAGNOSIS — R260 Ataxic gait: Secondary | ICD-10-CM | POA: Diagnosis not present

## 2018-07-14 DIAGNOSIS — M545 Low back pain: Secondary | ICD-10-CM | POA: Diagnosis not present

## 2018-07-17 DIAGNOSIS — R260 Ataxic gait: Secondary | ICD-10-CM | POA: Diagnosis not present

## 2018-07-17 DIAGNOSIS — M545 Low back pain: Secondary | ICD-10-CM | POA: Diagnosis not present

## 2018-07-18 ENCOUNTER — Ambulatory Visit: Payer: Federal, State, Local not specified - PPO | Admitting: Neurology

## 2018-07-18 ENCOUNTER — Encounter

## 2018-07-21 DIAGNOSIS — R260 Ataxic gait: Secondary | ICD-10-CM | POA: Diagnosis not present

## 2018-07-21 DIAGNOSIS — M545 Low back pain: Secondary | ICD-10-CM | POA: Diagnosis not present

## 2018-07-22 DIAGNOSIS — G10 Huntington's disease: Secondary | ICD-10-CM | POA: Diagnosis not present

## 2018-07-22 DIAGNOSIS — R260 Ataxic gait: Secondary | ICD-10-CM | POA: Diagnosis not present

## 2018-07-23 ENCOUNTER — Other Ambulatory Visit: Payer: Self-pay

## 2018-07-23 ENCOUNTER — Ambulatory Visit (INDEPENDENT_AMBULATORY_CARE_PROVIDER_SITE_OTHER): Payer: Medicare Other | Admitting: Family Medicine

## 2018-07-23 ENCOUNTER — Ambulatory Visit: Payer: Federal, State, Local not specified - PPO | Admitting: Neurology

## 2018-07-23 DIAGNOSIS — G10 Huntington's disease: Secondary | ICD-10-CM

## 2018-07-23 DIAGNOSIS — R260 Ataxic gait: Secondary | ICD-10-CM | POA: Diagnosis not present

## 2018-07-23 DIAGNOSIS — R197 Diarrhea, unspecified: Secondary | ICD-10-CM | POA: Diagnosis not present

## 2018-07-23 NOTE — Progress Notes (Signed)
Patient ID: Natasha BeckmannJudith Frederick, female   DOB: 10/15/1955, 63 y.o.   MRN: [id number]  Virtual Visit via Video Note  I connected with Natasha Frederick on 07/23/18 at  2:15 PM EDT by a video enabled telemedicine application and verified that I am speaking with the correct person using two identifiers.  Location patient: home Location provider:work or home office Persons participating in the virtual visit: patient, provider  I discussed the limitations of evaluation and management by telemedicine and the availability of in person appointments. The patient expressed understanding and agreed to proceed.   HPI:  Patient has history of Huntington's disease.  She was placed on a drug called Austedo about 6 months ago.  This medication can cause diarrhea and sedation but she relates only about 2 to 3-week history of loose stools.  She is having about 2-3 loose stools but not watery stools per day.  No bloody stools.  No recent travels.  She states that 2 days ago she was started on clindamycin for some type of oral infection per her dentist.  Her loose stools preceded the clindamycin.  No nausea or vomiting.  No fever.  She is having some intermittent abdominal cramps especially at night.  No recent dietary change.  No history of lactose intolerance.  No gluten sensitivity.  No appetite or weight changes.  She states she had colonoscopy about 2 to 3 years ago per [facility].  No recent fevers or chills.  Denies any major recent appetite or weight changes.  Took some Pepto-Bismol without improvement   ROS: See pertinent positives and negatives per HPI.  Past Medical History:  Diagnosis Date  . Arthritis    hip  . Endometriosis   . Scoliosis     Past Surgical History:  Procedure Laterality Date  . ANTERIOR LAT LUMBAR FUSION Left 01/05/2014   Procedure: LUMBAR TWO TO THREE, LUMBAR LUMBAR THREE TO FOUR, ANTERIOR LATERAL LUMBAR FUSION 2 LEVELS;  Surgeon: Reinaldo Meekerandy O Kritzer, MD;  Location: [facility];  Service:  Neurosurgery;  Laterality: Left;  . APPENDECTOMY  1963  . BACK SURGERY    . BREAST SURGERY Left 1987   bx  . COLONOSCOPY W/ POLYPECTOMY    . EYE SURGERY    . OOPHORECTOMY  1977   left  . Planters wart Left 2005  . radial keratoto Bilateral   . TONSILLECTOMY  1958  . TOTAL HIP ARTHROPLASTY Left 07/19/2014   Procedure: TOTAL HIP ARTHROPLASTY;  Surgeon: Gean BirchwoodFrank Rowan, MD;  Location: [facility];  Service: Orthopedics;  Laterality: Left;  . TUBAL LIGATION  1986    Family History  Problem Relation Age of Onset  . Lung cancer Mother   . Stroke Father   . Huntington's disease Paternal Grandmother   . Huntington's disease Paternal Aunt   . Huntington's disease Paternal Uncle   . Arthritis Neg Hx        family hx  . Cancer Neg Hx        prostate ca -family hx  . Heart disease Neg Hx        family hx    SOCIAL HX: Former smoker.  Quit 1995   Current Outpatient Medications:  .  Deutetrabenazine (AUSTEDO) 12 MG TABS, Take 24 mg by mouth 2 (two) times daily., Disp: 360 tablet, Rfl: 1 .  ibuprofen (ADVIL,MOTRIN) 800 MG tablet, Take 800 mg by mouth every 8 (eight) hours as needed. for pain, Disp: , Rfl: 0  EXAM:  VITALS per patient if applicable:  GENERAL: alert, oriented, appears well and in no acute distress  HEENT: atraumatic, conjunttiva clear, no obvious abnormalities on inspection of external nose and ears  NECK: normal movements of the head and neck  LUNGS: on inspection no signs of respiratory distress, breathing rate appears normal, no obvious gross SOB, gasping or wheezing  CV: no obvious cyanosis  MS: moves all visible extremities without noticeable abnormality  PSYCH/NEURO: pleasant and cooperative, no obvious depression or anxiety, speech and thought processing grossly intact  ASSESSMENT AND PLAN:  Discussed the following assessment and plan:  Diarrhea/loose stools.  Patient does take Austedo for her Huntington's chorea but she has been on this drug for about 6  months and current symptoms started 2 to 3 weeks ago.  She is on clindamycin for the past 2 days but symptoms preceded that.  She does not have any red flags such as recent travel, fever, bloody stools recommend the following  -Discussed dietary measures.  Avoid dairy products as well as fatty foods and high glycemic foods.  Recommend bland diet next several days -Try to get off clindamycin soon as possible.  Explained this is frequently seen associated with C. difficile colitis but again no suspicion for C. difficile or least low suspicion since she is only had about 2 loose stools per day and is only been on clindamycin for 2 days  -Suggest they try over-the-counter probiotics such as align -Also consider FiberCon to see if this will bulk up her stools any -Watch for any new symptoms such as fever or bloody stools -We discussed medication such as dicyclomine for cramping but would try to avoid given increased risk for side effects -Consider stool studies and further lab evaluation if not improving by next week     I discussed the assessment and treatment plan with the patient. The patient was provided an opportunity to ask questions and all were answered. The patient agreed with the plan and demonstrated an understanding of the instructions.   The patient was advised to call back or seek an in-person evaluation if the symptoms worsen or if the condition fails to improve as anticipated.  Over 25 minutes spent with patient assessing her symptoms, reviewing her recent history, discussing dietary factors, recommended potential therapies  Evelena Peat, MD

## 2018-07-24 DIAGNOSIS — M545 Low back pain: Secondary | ICD-10-CM | POA: Diagnosis not present

## 2018-07-24 DIAGNOSIS — R260 Ataxic gait: Secondary | ICD-10-CM | POA: Diagnosis not present

## 2018-07-28 ENCOUNTER — Ambulatory Visit (INDEPENDENT_AMBULATORY_CARE_PROVIDER_SITE_OTHER): Payer: Medicare Other | Admitting: Family Medicine

## 2018-07-28 ENCOUNTER — Other Ambulatory Visit: Payer: Self-pay

## 2018-07-28 DIAGNOSIS — M545 Low back pain: Secondary | ICD-10-CM | POA: Diagnosis not present

## 2018-07-28 DIAGNOSIS — R197 Diarrhea, unspecified: Secondary | ICD-10-CM | POA: Diagnosis not present

## 2018-07-28 DIAGNOSIS — R260 Ataxic gait: Secondary | ICD-10-CM | POA: Diagnosis not present

## 2018-07-28 LAB — COMPREHENSIVE METABOLIC PANEL
ALT: 17 U/L (ref 0–35)
AST: 17 U/L (ref 0–37)
Albumin: 4 g/dL (ref 3.5–5.2)
Alkaline Phosphatase: 66 U/L (ref 39–117)
BUN: 8 mg/dL (ref 6–23)
CO2: 24 mEq/L (ref 19–32)
Calcium: 9.5 mg/dL (ref 8.4–10.5)
Chloride: 92 mEq/L — ABNORMAL LOW (ref 96–112)
Creatinine, Ser: 0.47 mg/dL (ref 0.40–1.20)
GFR: 132.9 mL/min (ref >60.00)
Glucose, Bld: 90 mg/dL (ref 70–99)
Potassium: 4.3 mEq/L (ref 3.5–5.1)
Sodium: 124 mEq/L — ABNORMAL LOW (ref 135–145)
Total Bilirubin: 0.6 mg/dL (ref 0.2–1.2)
Total Protein: 6 g/dL (ref 6.0–8.3)

## 2018-07-28 LAB — CBC WITH DIFFERENTIAL/PLATELET
Basophils Absolute: 0.1 10*3/uL (ref 0.0–0.1)
Basophils Relative: 0.8 % (ref 0.0–3.0)
Eosinophils Absolute: 0.1 10*3/uL (ref 0.0–0.7)
Eosinophils Relative: 1.1 % (ref 0.0–5.0)
HCT: 35.5 % — ABNORMAL LOW (ref 36.0–46.0)
Hemoglobin: 12 g/dL (ref 12.0–15.0)
Lymphocytes Relative: 12.3 % (ref 12.0–46.0)
Lymphs Abs: 1.2 10*3/uL (ref 0.7–4.0)
MCHC: 34 g/dL (ref 30.0–36.0)
MCV: 86.4 fl (ref 78.0–100.0)
Monocytes Absolute: 0.8 10*3/uL (ref 0.1–1.0)
Monocytes Relative: 8 % (ref 3.0–12.0)
Neutro Abs: 7.4 10*3/uL (ref 1.4–7.7)
Neutrophils Relative %: 77.8 % — ABNORMAL HIGH (ref 43.0–77.0)
Platelets: 317 10*3/uL (ref 150.0–400.0)
RBC: 4.11 Mil/uL (ref 3.87–5.11)
RDW: 13.2 % (ref 11.5–15.5)
WBC: 9.5 10*3/uL (ref 4.0–10.5)

## 2018-07-28 MED ORDER — DICYCLOMINE HCL 10 MG PO CAPS: 10.0000 mg | ORAL_CAPSULE | Freq: Three times a day (TID) | ORAL | 0 refills | Status: DC

## 2018-07-28 NOTE — Progress Notes (Signed)
Patient ID: Natasha Frederick, female   DOB: 09-Feb-1956, 63 y.o.   MRN: [id number]  Virtual Visit via Video Note  I connected with Wynelle Beckmann on 07/28/18 at 11:15 AM EDT by a video enabled telemedicine application and verified that I am speaking with the correct person using two identifiers.  Location patient: home Location provider:work or home office Persons participating in the virtual visit: patient, provider  I discussed the limitations of evaluation and management by telemedicine and the availability of in person appointments. The patient expressed understanding and agreed to proceed.   HPI: Refer to note from last week.  Patient had called with about 2 to 3-week history of some diarrhea and loose stools.  She is on a Huntington's disease drug called Austedo and started that about 6 months ago.  That drug can be associated with diarrhea.  She also was taking clindamycin from her dentist but that was started only a few days prior to our visit and that followed onset of her diarrhea.  She has had less liquidy stools since starting probiotic and fiber but is having about 6 loose stools per day.  She tried some Imodium last night with minimal relief.  No fever.  No bloody stools.  Her major complaint is severe abdominal cramping.  No nausea or vomiting.  No recent travels.   ROS: See pertinent positives and negatives per HPI.  Past Medical History:  Diagnosis Date  . Arthritis    hip  . Endometriosis   . Scoliosis     Past Surgical History:  Procedure Laterality Date  . ANTERIOR LAT LUMBAR FUSION Left 01/05/2014   Procedure: LUMBAR TWO TO THREE, LUMBAR LUMBAR THREE TO FOUR, ANTERIOR LATERAL LUMBAR FUSION 2 LEVELS;  Surgeon: Reinaldo Meeker, MD;  Location: [facility];  Service: Neurosurgery;  Laterality: Left;  . APPENDECTOMY  1963  . BACK SURGERY    . BREAST SURGERY Left 1987   bx  . COLONOSCOPY W/ POLYPECTOMY    . EYE SURGERY    . OOPHORECTOMY  1977   left  . Planters wart Left 2005   . radial keratoto Bilateral   . TONSILLECTOMY  1958  . TOTAL HIP ARTHROPLASTY Left 07/19/2014   Procedure: TOTAL HIP ARTHROPLASTY;  Surgeon: Gean Birchwood, MD;  Location: [facility];  Service: Orthopedics;  Laterality: Left;  . TUBAL LIGATION  1986    Family History  Problem Relation Age of Onset  . Lung cancer Mother   . Stroke Father   . Huntington's disease Paternal Grandmother   . Huntington's disease Paternal Aunt   . Huntington's disease Paternal Uncle   . Arthritis Neg Hx        family hx  . Cancer Neg Hx        prostate ca -family hx  . Heart disease Neg Hx        family hx    SOCIAL HX: Smoker-quit 1995   Current Outpatient Medications:  .  Deutetrabenazine (AUSTEDO) 12 MG TABS, Take 24 mg by mouth 2 (two) times daily., Disp: 360 tablet, Rfl: 1 .  ibuprofen (ADVIL,MOTRIN) 800 MG tablet, Take 800 mg by mouth every 8 (eight) hours as needed. for pain, Disp: , Rfl: 0  EXAM:  VITALS per patient if applicable:  GENERAL: alert, oriented, appears well and in no acute distress  HEENT: atraumatic, conjunttiva clear, no obvious abnormalities on inspection of external nose and ears  NECK: normal movements of the head and neck  LUNGS: on inspection no  signs of respiratory distress, breathing rate appears normal, no obvious gross SOB, gasping or wheezing  CV: no obvious cyanosis  MS: moves all visible extremities without noticeable abnormality  PSYCH/NEURO: pleasant and cooperative, no obvious depression or anxiety, speech and thought processing grossly intact  ASSESSMENT AND PLAN:  Discussed the following assessment and plan:  Diarrhea, unspecified type - Plan: CBC with Differential/Platelet, CMP, Stool culture, CANCELED: C. difficile, PCR([facility]) -Patient is on drug for Huntington's disease called Austedo which can be associated with diarrhea but she was started on this 6 months ago and just had onset of diarrhea about 3 weeks ago -She is taking clindamycin as  above but diarrhea preceded the clindamycin -Given duration of symptoms will check CBC, comprehensive metabolic panel, stool culture, C. difficile PCR -Dicyclomine 10 mg every 6-8 hours as needed for abdominal cramping -Follow-up promptly for any bloody stools, fever, or worsening symptoms -We have advised that they contact their neurologist to let them know about the diarrhea symptoms, although not clear this would be related to her medication -We also advise they discontinue the clindamycin now    I discussed the assessment and treatment plan with the patient. The patient was provided an opportunity to ask questions and all were answered. The patient agreed with the plan and demonstrated an understanding of the instructions.   The patient was advised to call back or seek an in-person evaluation if the symptoms worsen or if the condition fails to improve as anticipated.   Evelena PeatBruce Burchette, MD

## 2018-07-29 ENCOUNTER — Telehealth: Payer: Self-pay | Admitting: Family Medicine

## 2018-07-29 DIAGNOSIS — G10 Huntington's disease: Secondary | ICD-10-CM | POA: Diagnosis not present

## 2018-07-29 DIAGNOSIS — R260 Ataxic gait: Secondary | ICD-10-CM | POA: Diagnosis not present

## 2018-07-29 NOTE — Telephone Encounter (Signed)
Called patient and gave her the message from Dr. Caryl Never and she stated that she will give it a few days and reach out to her dentist if it is not healing or she will call us back for an appointment with Dr. Caryl Never. Patient verbalized an understanding.

## 2018-07-29 NOTE — Telephone Encounter (Signed)
Please see message. °

## 2018-07-29 NOTE — Telephone Encounter (Signed)
Per our discussion, it sounded like she only had one more day of Clindamycin anyway (the Clindamycin was prescribed by her dentist).  We are trying to get rid of any medications that could worsen her diarrhea.  Unfortunately, most anti-biotics can potentially worsen diarrhea.  If mouth sore persists should follow up with dentist or could offer follow up here to assess.

## 2018-07-29 NOTE — Telephone Encounter (Signed)
Copied from CRM [id number]. Topic: Quick Communication - See Telephone Encounter >> Jul 29, 2018  9:58 AM Jolayne Haines L wrote: CRM for notification. See Telephone encounter for: 07/29/18.  Patient's husband, Melvyn Neth called and said during the visit yesterday he advised that she stop taking " Clindamycin " for a sore that is in her mouth. He would like to know what is she suppose take as a alternative since the sore is still there? Please Advise

## 2018-07-30 ENCOUNTER — Encounter (HOSPITAL_COMMUNITY): Admission: EM | Disposition: A | Discharge status: Skilled Nursing Facility | Payer: Self-pay | Source: Home / Self Care

## 2018-07-30 ENCOUNTER — Ambulatory Visit (INDEPENDENT_AMBULATORY_CARE_PROVIDER_SITE_OTHER): Payer: Medicare Other | Admitting: Family Medicine

## 2018-07-30 ENCOUNTER — Emergency Department (HOSPITAL_COMMUNITY): Payer: Medicare Other

## 2018-07-30 ENCOUNTER — Other Ambulatory Visit: Payer: Self-pay

## 2018-07-30 ENCOUNTER — Inpatient Hospital Stay (HOSPITAL_COMMUNITY)
Admission: EM | Admit: 2018-07-30 | Discharge: 2018-08-13 | DRG: 326 | Disposition: A | Discharge status: Skilled Nursing Facility | Payer: Medicare Other | Attending: Surgery | Admitting: Surgery

## 2018-07-30 ENCOUNTER — Encounter (HOSPITAL_COMMUNITY): Payer: Self-pay

## 2018-07-30 ENCOUNTER — Emergency Department (HOSPITAL_COMMUNITY): Payer: Medicare Other | Admitting: Certified Registered"

## 2018-07-30 DIAGNOSIS — I998 Other disorder of circulatory system: Secondary | ICD-10-CM | POA: Diagnosis not present

## 2018-07-30 DIAGNOSIS — E162 Hypoglycemia, unspecified: Secondary | ICD-10-CM | POA: Diagnosis not present

## 2018-07-30 DIAGNOSIS — G10 Huntington's disease: Secondary | ICD-10-CM | POA: Diagnosis present

## 2018-07-30 DIAGNOSIS — K259 Gastric ulcer, unspecified as acute or chronic, without hemorrhage or perforation: Secondary | ICD-10-CM | POA: Diagnosis not present

## 2018-07-30 DIAGNOSIS — E871 Hypo-osmolality and hyponatremia: Secondary | ICD-10-CM

## 2018-07-30 DIAGNOSIS — K653 Choleperitonitis: Secondary | ICD-10-CM

## 2018-07-30 DIAGNOSIS — R410 Disorientation, unspecified: Secondary | ICD-10-CM | POA: Diagnosis not present

## 2018-07-30 DIAGNOSIS — K567 Ileus, unspecified: Secondary | ICD-10-CM | POA: Diagnosis not present

## 2018-07-30 DIAGNOSIS — J9811 Atelectasis: Secondary | ICD-10-CM | POA: Diagnosis not present

## 2018-07-30 DIAGNOSIS — R1084 Generalized abdominal pain: Secondary | ICD-10-CM

## 2018-07-30 DIAGNOSIS — R209 Unspecified disturbances of skin sensation: Secondary | ICD-10-CM

## 2018-07-30 DIAGNOSIS — K255 Chronic or unspecified gastric ulcer with perforation: Secondary | ICD-10-CM | POA: Diagnosis not present

## 2018-07-30 DIAGNOSIS — Z9851 Tubal ligation status: Secondary | ICD-10-CM

## 2018-07-30 DIAGNOSIS — R197 Diarrhea, unspecified: Secondary | ICD-10-CM | POA: Diagnosis not present

## 2018-07-30 DIAGNOSIS — Z4781 Encounter for orthopedic aftercare following surgical amputation: Secondary | ICD-10-CM | POA: Diagnosis not present

## 2018-07-30 DIAGNOSIS — R52 Pain, unspecified: Secondary | ICD-10-CM | POA: Diagnosis not present

## 2018-07-30 DIAGNOSIS — M79604 Pain in right leg: Secondary | ICD-10-CM | POA: Diagnosis not present

## 2018-07-30 DIAGNOSIS — G8929 Other chronic pain: Secondary | ICD-10-CM | POA: Diagnosis present

## 2018-07-30 DIAGNOSIS — Z96642 Presence of left artificial hip joint: Secondary | ICD-10-CM | POA: Diagnosis present

## 2018-07-30 DIAGNOSIS — I959 Hypotension, unspecified: Secondary | ICD-10-CM | POA: Diagnosis not present

## 2018-07-30 DIAGNOSIS — Z978 Presence of other specified devices: Secondary | ICD-10-CM

## 2018-07-30 DIAGNOSIS — K251 Acute gastric ulcer with perforation: Secondary | ICD-10-CM | POA: Diagnosis not present

## 2018-07-30 DIAGNOSIS — Z931 Gastrostomy status: Secondary | ICD-10-CM | POA: Diagnosis not present

## 2018-07-30 DIAGNOSIS — Z882 Allergy status to sulfonamides status: Secondary | ICD-10-CM

## 2018-07-30 DIAGNOSIS — Z87891 Personal history of nicotine dependence: Secondary | ICD-10-CM

## 2018-07-30 DIAGNOSIS — Z79899 Other long term (current) drug therapy: Secondary | ICD-10-CM

## 2018-07-30 DIAGNOSIS — M1612 Unilateral primary osteoarthritis, left hip: Secondary | ICD-10-CM | POA: Diagnosis present

## 2018-07-30 DIAGNOSIS — I2510 Atherosclerotic heart disease of native coronary artery without angina pectoris: Secondary | ICD-10-CM | POA: Diagnosis not present

## 2018-07-30 DIAGNOSIS — E860 Dehydration: Secondary | ICD-10-CM | POA: Diagnosis present

## 2018-07-30 DIAGNOSIS — Z7401 Bed confinement status: Secondary | ICD-10-CM | POA: Diagnosis not present

## 2018-07-30 DIAGNOSIS — I743 Embolism and thrombosis of arteries of the lower extremities: Secondary | ICD-10-CM | POA: Diagnosis not present

## 2018-07-30 DIAGNOSIS — I744 Embolism and thrombosis of arteries of extremities, unspecified: Secondary | ICD-10-CM | POA: Diagnosis not present

## 2018-07-30 DIAGNOSIS — R0989 Other specified symptoms and signs involving the circulatory and respiratory systems: Secondary | ICD-10-CM | POA: Diagnosis not present

## 2018-07-30 DIAGNOSIS — N2889 Other specified disorders of kidney and ureter: Secondary | ICD-10-CM | POA: Diagnosis not present

## 2018-07-30 DIAGNOSIS — Z89511 Acquired absence of right leg below knee: Secondary | ICD-10-CM | POA: Diagnosis not present

## 2018-07-30 DIAGNOSIS — I82811 Embolism and thrombosis of superficial veins of right lower extremities: Secondary | ICD-10-CM | POA: Diagnosis not present

## 2018-07-30 DIAGNOSIS — I745 Embolism and thrombosis of iliac artery: Secondary | ICD-10-CM | POA: Diagnosis not present

## 2018-07-30 DIAGNOSIS — R188 Other ascites: Secondary | ICD-10-CM | POA: Diagnosis present

## 2018-07-30 DIAGNOSIS — M21371 Foot drop, right foot: Secondary | ICD-10-CM | POA: Diagnosis present

## 2018-07-30 DIAGNOSIS — K5790 Diverticulosis of intestine, part unspecified, without perforation or abscess without bleeding: Secondary | ICD-10-CM | POA: Diagnosis not present

## 2018-07-30 DIAGNOSIS — M21372 Foot drop, left foot: Secondary | ICD-10-CM | POA: Diagnosis present

## 2018-07-30 DIAGNOSIS — D62 Acute posthemorrhagic anemia: Secondary | ICD-10-CM | POA: Diagnosis not present

## 2018-07-30 DIAGNOSIS — Z791 Long term (current) use of non-steroidal anti-inflammatories (NSAID): Secondary | ICD-10-CM | POA: Diagnosis not present

## 2018-07-30 DIAGNOSIS — T8143XA Infection following a procedure, organ and space surgical site, initial encounter: Secondary | ICD-10-CM | POA: Diagnosis present

## 2018-07-30 DIAGNOSIS — Z880 Allergy status to penicillin: Secondary | ICD-10-CM

## 2018-07-30 DIAGNOSIS — D735 Infarction of spleen: Secondary | ICD-10-CM | POA: Diagnosis not present

## 2018-07-30 DIAGNOSIS — N179 Acute kidney failure, unspecified: Secondary | ICD-10-CM

## 2018-07-30 DIAGNOSIS — Z91030 Bee allergy status: Secondary | ICD-10-CM

## 2018-07-30 DIAGNOSIS — M419 Scoliosis, unspecified: Secondary | ICD-10-CM | POA: Diagnosis not present

## 2018-07-30 DIAGNOSIS — I7410 Embolism and thrombosis of unspecified parts of aorta: Secondary | ICD-10-CM | POA: Diagnosis present

## 2018-07-30 DIAGNOSIS — Z885 Allergy status to narcotic agent status: Secondary | ICD-10-CM

## 2018-07-30 DIAGNOSIS — I82812 Embolism and thrombosis of superficial veins of left lower extremities: Secondary | ICD-10-CM | POA: Diagnosis not present

## 2018-07-30 DIAGNOSIS — K651 Peritoneal abscess: Secondary | ICD-10-CM | POA: Diagnosis present

## 2018-07-30 DIAGNOSIS — M2550 Pain in unspecified joint: Secondary | ICD-10-CM | POA: Diagnosis not present

## 2018-07-30 DIAGNOSIS — Z23 Encounter for immunization: Secondary | ICD-10-CM

## 2018-07-30 DIAGNOSIS — J90 Pleural effusion, not elsewhere classified: Secondary | ICD-10-CM | POA: Diagnosis not present

## 2018-07-30 DIAGNOSIS — Z781 Physical restraint status: Secondary | ICD-10-CM

## 2018-07-30 DIAGNOSIS — R0902 Hypoxemia: Secondary | ICD-10-CM | POA: Diagnosis not present

## 2018-07-30 DIAGNOSIS — Z4659 Encounter for fitting and adjustment of other gastrointestinal appliance and device: Secondary | ICD-10-CM

## 2018-07-30 DIAGNOSIS — Z801 Family history of malignant neoplasm of trachea, bronchus and lung: Secondary | ICD-10-CM

## 2018-07-30 DIAGNOSIS — Z820 Family history of epilepsy and other diseases of the nervous system: Secondary | ICD-10-CM

## 2018-07-30 DIAGNOSIS — Z91048 Other nonmedicinal substance allergy status: Secondary | ICD-10-CM

## 2018-07-30 DIAGNOSIS — I10 Essential (primary) hypertension: Secondary | ICD-10-CM | POA: Diagnosis not present

## 2018-07-30 DIAGNOSIS — M79605 Pain in left leg: Secondary | ICD-10-CM | POA: Diagnosis not present

## 2018-07-30 DIAGNOSIS — Z981 Arthrodesis status: Secondary | ICD-10-CM

## 2018-07-30 DIAGNOSIS — K668 Other specified disorders of peritoneum: Secondary | ICD-10-CM | POA: Diagnosis present

## 2018-07-30 DIAGNOSIS — G894 Chronic pain syndrome: Secondary | ICD-10-CM | POA: Diagnosis present

## 2018-07-30 HISTORY — PX: LAPAROSCOPY: SHX197

## 2018-07-30 LAB — CBC WITH DIFFERENTIAL/PLATELET
Abs Immature Granulocytes: 0 10*3/uL (ref 0.00–0.07)
Band Neutrophils: 22 %
Basophils Absolute: 0 10*3/uL (ref 0.0–0.1)
Basophils Relative: 0 %
Eosinophils Absolute: 0 10*3/uL (ref 0.0–0.5)
Eosinophils Relative: 0 %
HCT: 46.5 % — ABNORMAL HIGH (ref 36.0–46.0)
Hemoglobin: 15.1 g/dL — ABNORMAL HIGH (ref 12.0–15.0)
Lymphocytes Relative: 5 %
Lymphs Abs: 0.2 10*3/uL — ABNORMAL LOW (ref 0.7–4.0)
MCH: 29 pg (ref 26.0–34.0)
MCHC: 32.5 g/dL (ref 30.0–36.0)
MCV: 89.4 fL (ref 80.0–100.0)
Monocytes Absolute: 0.1 10*3/uL (ref 0.1–1.0)
Monocytes Relative: 2 %
Neutro Abs: 4.3 10*3/uL (ref 1.7–7.7)
Neutrophils Relative %: 71 %
Platelets: 372 10*3/uL (ref 150–400)
RBC: 5.2 MIL/uL — ABNORMAL HIGH (ref 3.87–5.11)
RDW: 12.6 % (ref 11.5–15.5)
WBC: 4.6 10*3/uL (ref 4.0–10.5)
nRBC: 0 % (ref 0.0–0.2)

## 2018-07-30 LAB — COMPREHENSIVE METABOLIC PANEL
ALT: 23 U/L (ref 0–44)
AST: 44 U/L — ABNORMAL HIGH (ref 15–41)
Albumin: 3.2 g/dL — ABNORMAL LOW (ref 3.5–5.0)
Alkaline Phosphatase: 52 U/L (ref 38–126)
Anion gap: 17 — ABNORMAL HIGH (ref 5–15)
BUN: 18 mg/dL (ref 8–23)
CO2: 15 mmol/L — ABNORMAL LOW (ref 22–32)
Calcium: 8.2 mg/dL — ABNORMAL LOW (ref 8.9–10.3)
Chloride: 91 mmol/L — ABNORMAL LOW (ref 98–111)
Creatinine, Ser: 1.84 mg/dL — ABNORMAL HIGH (ref 0.44–1.00)
GFR calc Af Amer: 33 mL/min — ABNORMAL LOW (ref >60)
GFR calc non Af Amer: 28 mL/min — ABNORMAL LOW (ref >60)
Glucose, Bld: 184 mg/dL — ABNORMAL HIGH (ref 70–99)
Potassium: 4.4 mmol/L (ref 3.5–5.1)
Sodium: 123 mmol/L — ABNORMAL LOW (ref 135–145)
Total Bilirubin: 0.8 mg/dL (ref 0.3–1.2)
Total Protein: 5.8 g/dL — ABNORMAL LOW (ref 6.5–8.1)

## 2018-07-30 LAB — LIPASE, BLOOD: Lipase: 130 U/L — ABNORMAL HIGH (ref 11–51)

## 2018-07-30 LAB — LACTIC ACID, PLASMA: Lactic Acid, Venous: 4.7 mmol/L (ref 0.5–1.9)

## 2018-07-30 SURGERY — LAPAROSCOPY, DIAGNOSTIC
Anesthesia: General

## 2018-07-30 MED ORDER — MIDAZOLAM HCL 2 MG/2ML IJ SOLN
INTRAMUSCULAR | Status: AC
  Filled 2018-07-30: qty 2

## 2018-07-30 MED ORDER — CLINDAMYCIN PHOSPHATE 900 MG/50ML IV SOLN
INTRAVENOUS | Status: AC
  Filled 2018-07-30: qty 50

## 2018-07-30 MED ORDER — LACTATED RINGERS IR SOLN
Status: DC | PRN
  Administered 2018-07-30: 1000 mL

## 2018-07-30 MED ORDER — PHENYLEPHRINE 40 MCG/ML (10ML) SYRINGE FOR IV PUSH (FOR BLOOD PRESSURE SUPPORT)
PREFILLED_SYRINGE | INTRAVENOUS | Status: DC | PRN
  Administered 2018-07-30: 200 ug via INTRAVENOUS
  Administered 2018-07-30: 120 ug via INTRAVENOUS
  Administered 2018-07-30: 200 ug via INTRAVENOUS
  Administered 2018-07-30: 160 ug via INTRAVENOUS
  Administered 2018-07-30: 120 ug via INTRAVENOUS

## 2018-07-30 MED ORDER — PROPOFOL 10 MG/ML IV BOLUS
INTRAVENOUS | Status: AC
  Filled 2018-07-30: qty 20

## 2018-07-30 MED ORDER — ONDANSETRON HCL 4 MG/2ML IJ SOLN
4.0000 mg | Freq: Once | INTRAMUSCULAR | Status: AC
  Administered 2018-07-30: 4 mg via INTRAVENOUS
  Filled 2018-07-30: qty 2

## 2018-07-30 MED ORDER — SODIUM CHLORIDE (PF) 0.9 % IJ SOLN
INTRAMUSCULAR | Status: AC
  Administered 2018-07-30: 21:00:00
  Filled 2018-07-30: qty 50

## 2018-07-30 MED ORDER — SODIUM CHLORIDE 0.9 % IV SOLN
INTRAVENOUS | Status: DC | PRN
  Administered 2018-07-30: 50 ug/min via INTRAVENOUS

## 2018-07-30 MED ORDER — BUPIVACAINE-EPINEPHRINE (PF) 0.25% -1:200000 IJ SOLN
INTRAMUSCULAR | Status: AC
  Filled 2018-07-30: qty 60

## 2018-07-30 MED ORDER — ALBUMIN HUMAN 5 % IV SOLN
INTRAVENOUS | Status: DC | PRN
  Administered 2018-07-30: via INTRAVENOUS

## 2018-07-30 MED ORDER — ETOMIDATE 2 MG/ML IV SOLN
INTRAVENOUS | Status: AC
  Filled 2018-07-30: qty 10

## 2018-07-30 MED ORDER — SODIUM CHLORIDE 0.9 % IV SOLN
2.0000 g | Freq: Once | INTRAVENOUS | Status: AC
  Administered 2018-07-30: 2 g via INTRAVENOUS
  Filled 2018-07-30: qty 2

## 2018-07-30 MED ORDER — EPHEDRINE SULFATE-NACL 50-0.9 MG/10ML-% IV SOSY
PREFILLED_SYRINGE | INTRAVENOUS | Status: DC | PRN
  Administered 2018-07-30: 15 mg via INTRAVENOUS

## 2018-07-30 MED ORDER — PHENYLEPHRINE 40 MCG/ML (10ML) SYRINGE FOR IV PUSH (FOR BLOOD PRESSURE SUPPORT)
PREFILLED_SYRINGE | INTRAVENOUS | Status: AC
  Filled 2018-07-30: qty 30

## 2018-07-30 MED ORDER — LACTATED RINGERS IV SOLN
INTRAVENOUS | Status: DC | PRN
  Administered 2018-07-30 – 2018-07-31 (×2): via INTRAVENOUS

## 2018-07-30 MED ORDER — GENTAMICIN SULFATE 40 MG/ML IJ SOLN
5.0000 mg/kg | INTRAVENOUS | Status: AC
  Administered 2018-07-30: 306 mg via INTRAVENOUS
  Filled 2018-07-30: qty 7.75

## 2018-07-30 MED ORDER — CLINDAMYCIN PHOSPHATE 900 MG/50ML IV SOLN
900.0000 mg | INTRAVENOUS | Status: AC
  Administered 2018-07-30: 900 mg via INTRAVENOUS

## 2018-07-30 MED ORDER — ONDANSETRON HCL 4 MG/2ML IJ SOLN
INTRAMUSCULAR | Status: AC
  Filled 2018-07-30: qty 2

## 2018-07-30 MED ORDER — IOHEXOL 300 MG/ML  SOLN
100.0000 mL | Freq: Once | INTRAMUSCULAR | Status: AC | PRN
  Administered 2018-07-30: 100 mL via INTRAVENOUS

## 2018-07-30 MED ORDER — CHLORHEXIDINE GLUCONATE CLOTH 2 % EX PADS: 6.0000 | MEDICATED_PAD | Freq: Once | CUTANEOUS | Status: DC

## 2018-07-30 MED ORDER — 0.9 % SODIUM CHLORIDE (POUR BTL) OPTIME
TOPICAL | Status: DC | PRN
  Administered 2018-07-30: 2000 mL

## 2018-07-30 MED ORDER — PHENYLEPHRINE 40 MCG/ML (10ML) SYRINGE FOR IV PUSH (FOR BLOOD PRESSURE SUPPORT)
PREFILLED_SYRINGE | INTRAVENOUS | Status: AC
  Filled 2018-07-30: qty 40

## 2018-07-30 MED ORDER — ROCURONIUM BROMIDE 10 MG/ML (PF) SYRINGE
PREFILLED_SYRINGE | INTRAVENOUS | Status: DC | PRN
  Administered 2018-07-30: 40 mg via INTRAVENOUS

## 2018-07-30 MED ORDER — SODIUM CHLORIDE 0.9 % IV SOLN
INTRAVENOUS | Status: DC
  Filled 2018-07-30: qty 6

## 2018-07-30 MED ORDER — LACTATED RINGERS IV SOLN
INTRAVENOUS | Status: DC | PRN
  Administered 2018-07-30 (×2): via INTRAVENOUS

## 2018-07-30 MED ORDER — METRONIDAZOLE IN NACL 5-0.79 MG/ML-% IV SOLN
500.0000 mg | Freq: Once | INTRAVENOUS | Status: AC
  Administered 2018-07-30: 500 mg via INTRAVENOUS
  Filled 2018-07-30: qty 100

## 2018-07-30 MED ORDER — SODIUM CHLORIDE 0.9 % IR SOLN
Status: DC | PRN
  Administered 2018-07-30: 12000 mL

## 2018-07-30 MED ORDER — FENTANYL CITRATE (PF) 250 MCG/5ML IJ SOLN
INTRAMUSCULAR | Status: AC
  Filled 2018-07-30: qty 5

## 2018-07-30 SURGICAL SUPPLY — 47 items
CABLE HIGH FREQUENCY MONO STRZ (ELECTRODE) ×2 IMPLANT
COVER SURGICAL LIGHT HANDLE (MISCELLANEOUS) ×2 IMPLANT
COVER WAND RF STERILE (DRAPES) IMPLANT
DECANTER SPIKE VIAL GLASS SM (MISCELLANEOUS) ×2 IMPLANT
DRAIN CHANNEL 19F RND (DRAIN) ×2 IMPLANT
DRAPE UTILITY XL STRL (DRAPES) ×2 IMPLANT
DRAPE WARM FLUID 44X44 (DRAPE) ×2 IMPLANT
DRSG TEGADERM 2-3/8X2-3/4 SM (GAUZE/BANDAGES/DRESSINGS) ×2 IMPLANT
DRSG TEGADERM 4X4.75 (GAUZE/BANDAGES/DRESSINGS) ×3 IMPLANT
ELECT CAUTERY BLADE 6.4 (BLADE) ×1 IMPLANT
ELECT REM PT RETURN 15FT ADLT (MISCELLANEOUS) ×2 IMPLANT
EVACUATOR SILICONE 100CC (DRAIN) ×2 IMPLANT
GAUZE SPONGE 2X2 8PLY STRL LF (GAUZE/BANDAGES/DRESSINGS) ×1 IMPLANT
GLOVE ECLIPSE 8.0 STRL XLNG CF (GLOVE) ×2 IMPLANT
GLOVE INDICATOR 8.0 STRL GRN (GLOVE) ×2 IMPLANT
GOWN STRL REUS W/TWL XL LVL3 (GOWN DISPOSABLE) ×6 IMPLANT
HANDLE SUCTION POOLE (INSTRUMENTS) IMPLANT
IRRIG SUCT STRYKERFLOW 2 WTIP (MISCELLANEOUS) ×2
IRRIGATION SUCT STRKRFLW 2 WTP (MISCELLANEOUS) ×1 IMPLANT
KIT BASIN OR (CUSTOM PROCEDURE TRAY) ×2 IMPLANT
KIT TURNOVER KIT A (KITS) ×1 IMPLANT
PAD POSITIONING PINK XL (MISCELLANEOUS) ×2 IMPLANT
PROTECTOR NERVE ULNAR (MISCELLANEOUS) ×1 IMPLANT
SCISSORS LAP 5X35 DISP (ENDOMECHANICALS) ×1 IMPLANT
SEALER TISSUE G2 STRG ARTC 35C (ENDOMECHANICALS) IMPLANT
SET TUBE SMOKE EVAC HIGH FLOW (TUBING) IMPLANT
SLEEVE XCEL OPT CAN 5 100 (ENDOMECHANICALS) ×4 IMPLANT
SPONGE GAUZE 2X2 STER 10/PKG (GAUZE/BANDAGES/DRESSINGS) ×1
SUCTION POOLE HANDLE (INSTRUMENTS) ×2
SUT MNCRL AB 4-0 PS2 18 (SUTURE) ×2 IMPLANT
SUT PROLENE 2 0 CT2 30 (SUTURE) ×2 IMPLANT
SUT SILK 2 0 (SUTURE) ×2
SUT SILK 2 0 SH CR/8 (SUTURE) ×2 IMPLANT
SUT SILK 2-0 18XBRD TIE 12 (SUTURE) ×1 IMPLANT
SUT SILK 3 0 (SUTURE) ×2
SUT SILK 3 0 SH CR/8 (SUTURE) ×2 IMPLANT
SUT SILK 3-0 18XBRD TIE 12 (SUTURE) ×1 IMPLANT
SUT VLOC 180 2-0 9IN GS21 (SUTURE) ×2 IMPLANT
TOWEL OR 17X26 10 PK STRL BLUE (TOWEL DISPOSABLE) ×2 IMPLANT
TOWEL OR NON WOVEN STRL DISP B (DISPOSABLE) ×2 IMPLANT
TRAY FOLEY MTR SLVR 14FR STAT (SET/KITS/TRAYS/PACK) ×1 IMPLANT
TRAY FOLEY MTR SLVR 16FR STAT (SET/KITS/TRAYS/PACK) IMPLANT
TRAY LAPAROSCOPIC (CUSTOM PROCEDURE TRAY) ×2 IMPLANT
TROCAR BLADELESS OPT 5 100 (ENDOMECHANICALS) ×2 IMPLANT
TROCAR XCEL NON-BLD 11X100MML (ENDOMECHANICALS) ×1 IMPLANT
TUBING EVAC SMOKE HEATED PNEUM (TUBING) ×1 IMPLANT
YANKAUER SUCT BULB TIP NO VENT (SUCTIONS) ×1 IMPLANT

## 2018-07-30 NOTE — Telephone Encounter (Signed)
Called patient and spoke to husband and gave him message from Dr. Caryl Never. Husband stated that she stopped taking the Bentyl and she will continue to take electrolyte drinks and they will come for her lab tomorrow at 11am.

## 2018-07-30 NOTE — ED Provider Notes (Signed)
[facility]-EMERGENCY DEPT Provider Note   CSN: [id number] Arrival date & time: 07/30/18  1931    History   Chief Complaint Chief Complaint  Patient presents with   Abdominal Pain   Leg Pain    HPI Natasha Frederick is a 63 y.o. female.     HPI Patient presents to the emergency department with abdominal discomfort that is been ongoing for the last 2 weeks with diarrhea.  She states he got worse over the last 48 hours.  She seen her doctor twice in that timeframe and they advised her to come to the emergency department tonight.  Patient states that nothing seems to make the condition better but palpation and movement makes the pain worse.  The patient denies chest pain, shortness of breath, headache,blurred vision, neck pain, fever, cough, weakness, numbness, dizziness, anorexia, edema,rash, back pain, dysuria, hematemesis, bloody stool, near syncope, or syncope.  She has had nausea and vomiting today as well. Past Medical History:  Diagnosis Date   Arthritis    hip   Endometriosis    Scoliosis     Patient Active Problem List   Diagnosis Date Noted   Pneumoperitoneum 07/30/2018   Long term current use of non-steroidal anti-inflammatories (NSAID) 07/30/2018   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   Primary osteoarthritis of left hip 07/19/2014   Scoliosis of lumbar spine 01/05/2014    Past Surgical History:  Procedure Laterality Date   ANTERIOR LAT LUMBAR FUSION Left 01/05/2014   Procedure: LUMBAR TWO TO THREE, LUMBAR LUMBAR THREE TO FOUR, ANTERIOR LATERAL LUMBAR FUSION 2 LEVELS;  Surgeon: Reinaldo Meeker, MD;  Location: [facility];  Service: Neurosurgery;  Laterality: Left;   APPENDECTOMY  1963   BACK SURGERY     BREAST SURGERY Left 1987   bx   COLONOSCOPY W/ POLYPECTOMY     EYE SURGERY     OOPHORECTOMY  1977   left   Planters wart Left 2005   radial keratoto Bilateral    TONSILLECTOMY  1958   TOTAL HIP ARTHROPLASTY Left  07/19/2014   Procedure: TOTAL HIP ARTHROPLASTY;  Surgeon: Gean Birchwood, MD;  Location: [facility] OR;  Service: Orthopedics;  Laterality: Left;   TUBAL LIGATION  1986     OB History   No obstetric history on file.      Home Medications    Prior to Admission medications   Medication Sig Start Date End Date Taking? Authorizing Provider  Deutetrabenazine (AUSTEDO) 12 MG TABS Take 24 mg by mouth 2 (two) times daily. 02/27/18   Tat, Octaviano Batty, DO  dicyclomine (BENTYL) 10 MG capsule Take 1 capsule (10 mg total) by mouth 4 (four) times daily -  before meals and at bedtime. 07/28/18   Burchette, Elberta Fortis, MD  ibuprofen (ADVIL,MOTRIN) 800 MG tablet Take 800 mg by mouth every 8 (eight) hours as needed. for pain 11/30/14   [provider]    Family History Family History  Problem Relation Age of Onset   Lung cancer Mother    Stroke Father    Huntington's disease Paternal Grandmother    Huntington's disease Paternal Aunt    Huntington's disease Paternal Uncle    Arthritis Neg Hx        family hx   Cancer Neg Hx        prostate ca -family hx   Heart disease Neg Hx        family hx    Social History Social History   Tobacco Use  Smoking status: Former Smoker    Packs/day: 1.00    Years: 8.00    Pack years: 8.00    Types: Cigarettes    Last attempt to quit: 01/17/1994    Years since quitting: 24.5   Smokeless tobacco: Never Used  Substance Use Topics   Alcohol use: Yes    Alcohol/week: 3.0 standard drinks    Types: 3 Cans of beer per week    Comment: per week   Drug use: No     Allergies   Bee venom; Adhesive [tape]; Bee venom; Codeine; Penicillins; and Sulfa antibiotics   Review of Systems Review of Systems All other systems negative except as documented in the HPI. All pertinent positives and negatives as reviewed in the HPI.  Physical Exam Updated Vital Signs BP 112/78 (BP Location: Right Arm)    Pulse 69    Temp 97.9 F (36.6 C) (Oral)    Resp (!) 23     Ht  (1.626 m)    Wt 61.2 kg    SpO2 100%    BMI 23.17 kg/m   Physical Exam Vitals signs and nursing note reviewed.  Constitutional:      General: She is not in acute distress.    Appearance: She is well-developed.  HENT:     Head: Normocephalic and atraumatic.  Eyes:     Pupils: Pupils are equal, round, and reactive to light.  Neck:     Musculoskeletal: Normal range of motion and neck supple.  Cardiovascular:     Rate and Rhythm: Normal rate and regular rhythm.     Heart sounds: Normal heart sounds. No murmur. No friction rub. No gallop.   Pulmonary:     Effort: Pulmonary effort is normal. No respiratory distress.     Breath sounds: Normal breath sounds. No wheezing.  Abdominal:     General: Bowel sounds are normal. There is no distension.     Palpations: Abdomen is soft.     Tenderness: There is generalized abdominal tenderness. There is guarding. There is no rebound.     Hernia: No hernia is present.  Skin:    General: Skin is warm and dry.     Capillary Refill: Capillary refill takes less than 2 seconds.     Findings: No erythema or rash.  Neurological:     Mental Status: She is alert and oriented to person, place, and time.     Motor: No abnormal muscle tone.     Coordination: Coordination normal.  Psychiatric:        Behavior: Behavior normal.      ED Treatments / Results  Labs (all labs ordered are listed, but only abnormal results are displayed) Labs Reviewed  COMPREHENSIVE METABOLIC PANEL - Abnormal; Notable for the following components:      Result Value   Sodium 123 (*)    Chloride 91 (*)    CO2 15 (*)    Glucose, Bld 184 (*)    Creatinine, Ser 1.84 (*)    Calcium 8.2 (*)    Total Protein 5.8 (*)    Albumin 3.2 (*)    AST 44 (*)    GFR calc non Af Amer 28 (*)    GFR calc Af Amer 33 (*)    Anion gap 17 (*)    All other components within normal limits  LIPASE, BLOOD - Abnormal; Notable for the following components:   Lipase 130 (*)    All  other components within normal limits  CBC WITH DIFFERENTIAL/PLATELET - Abnormal; Notable for the following components:   RBC 5.20 (*)    Hemoglobin 15.1 (*)    HCT 46.5 (*)    Lymphs Abs 0.2 (*)    All other components within normal limits  URINE CULTURE  URINALYSIS, ROUTINE W REFLEX MICROSCOPIC  LACTIC ACID, PLASMA  LACTIC ACID, PLASMA    EKG None  Radiology Ct Abdomen Pelvis W Contrast  Result Date: 07/30/2018 CLINICAL DATA:  Generalized abdominal pain, left lower quadrant pain. EXAM: CT ABDOMEN AND PELVIS WITH CONTRAST TECHNIQUE: Multidetector CT imaging of the abdomen and pelvis was performed using the standard protocol following bolus administration of intravenous contrast. CONTRAST:  OMNIPAQUE IOHEXOL 300 MG/ML  SOLN COMPARISON:  None FINDINGS: Lower chest: Small bilateral pleural effusions. Bibasilar atelectasis. Heart is normal size. Hepatobiliary: No focal hepatic abnormality. Gallbladder unremarkable. Pancreas: No focal abnormality or ductal dilatation. Spleen: No focal abnormality.  Normal size. Adrenals/Urinary Tract: Wedge-shaped low-density and area of decreased enhancement in the midpole of the right kidney could reflect renal infarct. No hydronephrosis. Adrenal glands and left kidney unremarkable. Urinary bladder grossly unremarkable. Stomach/Bowel: Gastric wall appears thickened in the distal stomach region. Small bowel is decompressed and grossly unremarkable. Left colonic diverticulosis. No active diverticulitis. Moderate stool burden in the colon. Vascular/Lymphatic: Aortic atherosclerosis. No enlarged abdominal or pelvic lymph nodes. Reproductive: No visible pelvic masses. Beam hardening artifact from the left hip replacement obscures lower pelvic structures. Other: Large volume ascites and large amount of pneumoperitoneum noted in the abdomen. Exact source is difficult to determine. The mid to distal gastric wall does appear thickened possibly related to gastritis on  coronal image 47, there is gas collection noted which appears to be along the lesser curvature of the stomach, possibly within the wall, possibly representing large ulceration. Musculoskeletal: Postoperative fusion changes in the lumbar spine. Diffuse degenerative disc disease changes. No acute bony abnormality. Prior left hip replacement. IMPRESSION: Large volume pneumoperitoneum and ascites in the abdomen. Exact source is difficult to determine, but favor gastric. The mid to distal gastric wall appears thickened and there may be large ulceration along the lesser curvature. Left colonic diverticulosis.  No active diverticulitis. Aortic atherosclerosis. Wedge-shaped low-density area in the midpole of the right kidney may reflect renal infarct. Small bilateral pleural effusions, bibasilar atelectasis. These results were called by telephone at the time of interpretation on 07/30/2018 at 9:16 pm to [facility] , who verbally acknowledged these results. Electronically Signed   By: Charlett Nose M.D.   On: 07/30/2018 21:18    Procedures Procedures (including critical care time)  Medications Ordered in ED Medications  ceFEPIme (MAXIPIME) 2 g in sodium chloride 0.9 % 100 mL IVPB (has no administration in time range)  metroNIDAZOLE (FLAGYL) IVPB 500 mg (has no administration in time range)  ondansetron (ZOFRAN) injection 4 mg (4 mg Intravenous Given 07/30/18 2002)  iohexol (OMNIPAQUE) 300 MG/ML solution 100 mL (100 mLs Intravenous Contrast Given 07/30/18 2053)  sodium chloride (PF) 0.9 % injection (  Given by Other 07/30/18 2117)     Initial Impression / Assessment and Plan / ED Course  I have reviewed the triage vital signs and the nursing notes.  Pertinent labs & imaging results that were available during my care of the patient were reviewed by me and considered in my medical decision making (see chart for details).        I spoke with Dr. [name] of general surgery who will evaluate the patient  for surgical intervention  to repair the perforated gastric ulcer.  Patient has been stable otherwise here in the emergency department.  She is not been hypotensive or tachycardic.  Started IV antibiotics along with IV fluids.  CRITICAL CARE Performed by: Jamesetta Orleanshristopher W Adonia Porada Total critical care time: 40 minutes Critical care time was exclusive of separately billable procedures and treating other patients. Critical care was necessary to treat or prevent imminent or life-threatening deterioration. Critical care was time spent personally by me on the following activities: development of treatment plan with patient and/or surrogate as well as nursing, discussions with consultants, evaluation of patient's response to treatment, examination of patient, obtaining history from patient or surrogate, ordering and performing treatments and interventions, ordering and review of laboratory studies, ordering and review of radiographic studies, pulse oximetry and re-evaluation of patient's condition.   Final Clinical Impressions(s) / ED Diagnoses   Final diagnoses:  None    ED Discharge Orders    None       Kyra MangesLawyer, Lasha Echeverria, PA-C 07/30/18 2159    Gwyneth SproutPlunkett, Whitney, MD 08/01/18 2129

## 2018-07-30 NOTE — Telephone Encounter (Signed)
Called patient and spoke to husband and he asked if the Bentyl could be causing the leg cramps? He has given her a full bottle of electrolyte drink and not really any improvement yet.  Please advise.

## 2018-07-30 NOTE — ED Notes (Signed)
Patient vomited x2, stating that the cramps in her legs are so bad that it is making her sick. Patient denies N/V prior to this incident.

## 2018-07-30 NOTE — Telephone Encounter (Signed)
Pts husband called in stating the stomach pain seems to be doing better. The leg pain seems to be getting worse. He is requesting a medication to help with her pain or to speak with PCP. Pt is requesting a call from PCP. [phone]

## 2018-07-30 NOTE — ED Notes (Signed)
Patient given CHG bath and changed into a clean gown with a clean sheet. Unable to remove rings from left ring finger. OR made aware.

## 2018-07-30 NOTE — Progress Notes (Signed)
Patient ID: Natasha Frederick, female   DOB: Jan 28, 1956, 63 y.o.   MRN: [id number]  Virtual Visit via Video Note  I connected with Wynelle Beckmann on 07/30/18 at  8:45 AM EDT by a video enabled telemedicine application and verified that I am speaking with the correct person using two identifiers.  Location patient: home Location provider:work or home office Persons participating in the virtual visit: patient, provider   HPI: Spoke with her husband predominantly.  She did not get much sleep last night.  Refer to recent notes.  She had presented with some cramp-like pains and diarrhea.  We had obtained lab work couple days ago including comprehensive metabolic panel and CBC along with stool studies.  They have not brought back stool specimen yet.  Fortunately, her diarrhea is improving.  She has had more formed stool and less frequent stool.  No fever.  No nausea or vomiting.  Does have poor appetite.  She had sodium 124.  No diuretic use.  Suspect related to decreased oral intake past few days.  White count was normal.  Hemoglobin normal.  Major concern is that she has had some abdominal pain mostly periumbilical and becoming a little bit more continuous.  They tried some dicyclomine for cramping and this did not seem to help.  She has not had any localization of pain to the right or left side or radiation to the back.  Currently sleeping.  I discussed the limitations of evaluation and management by telemedicine and the availability of in person appointments. The patient expressed understanding and agreed to proceed.    ROS: See pertinent positives and negatives per HPI.  Past Medical History:  Diagnosis Date  . Arthritis    hip  . Endometriosis   . Scoliosis     Past Surgical History:  Procedure Laterality Date  . ANTERIOR LAT LUMBAR FUSION Left 01/05/2014   Procedure: LUMBAR TWO TO THREE, LUMBAR LUMBAR THREE TO FOUR, ANTERIOR LATERAL LUMBAR FUSION 2 LEVELS;  Surgeon: Reinaldo Meeker, MD;   Location: [facility];  Service: Neurosurgery;  Laterality: Left;  . APPENDECTOMY  1963  . BACK SURGERY    . BREAST SURGERY Left 1987   bx  . COLONOSCOPY W/ POLYPECTOMY    . EYE SURGERY    . OOPHORECTOMY  1977   left  . Planters wart Left 2005  . radial keratoto Bilateral   . TONSILLECTOMY  1958  . TOTAL HIP ARTHROPLASTY Left 07/19/2014   Procedure: TOTAL HIP ARTHROPLASTY;  Surgeon: Gean Birchwood, MD;  Location: [facility] OR;  Service: Orthopedics;  Laterality: Left;  . TUBAL LIGATION  1986    Family History  Problem Relation Age of Onset  . Lung cancer Mother   . Stroke Father   . Huntington's disease Paternal Grandmother   . Huntington's disease Paternal Aunt   . Huntington's disease Paternal Uncle   . Arthritis Neg Hx        family hx  . Cancer Neg Hx        prostate ca -family hx  . Heart disease Neg Hx        family hx    SOCIAL HX: Former smoker.  Quit around 1995   Current Outpatient Medications:  .  Deutetrabenazine (AUSTEDO) 12 MG TABS, Take 24 mg by mouth 2 (two) times daily., Disp: 360 tablet, Rfl: 1 .  dicyclomine (BENTYL) 10 MG capsule, Take 1 capsule (10 mg total) by mouth 4 (four) times daily -  before meals and at  bedtime., Disp: 30 capsule, Rfl: 0 .  ibuprofen (ADVIL,MOTRIN) 800 MG tablet, Take 800 mg by mouth every 8 (eight) hours as needed. for pain, Disp: , Rfl: 0  EXAM:  VITALS per patient if applicable:  GENERAL: alert, oriented, appears well and in no acute distress  HEENT: atraumatic, conjunttiva clear, no obvious abnormalities on inspection of external nose and ears  NECK: normal movements of the head and neck  LUNGS: on inspection no signs of respiratory distress, breathing rate appears normal, no obvious gross SOB, gasping or wheezing  CV: no obvious cyanosis  MS: moves all visible extremities without noticeable abnormality  PSYCH/NEURO: pleasant and cooperative, no obvious depression or anxiety, speech and thought processing grossly  intact  ASSESSMENT AND PLAN:  Discussed the following assessment and plan:  #1 diarrhea.  Fortunately this is improving.  They have not brought back specimen yet for stool culture but this may not be necessary as diarrhea is improving  Doubt related to [place]. -Continue with bland diet. -We will proceed with further stool studies if she has any recurrence of diarrhea   #2 hyponatremia probably related to decreased intake.  No diuretic use. -Suggested they try some Gatorade and also try some bland things like saltines -Repeat basic metabolic panel tomorrow  #3 abdominal pain.  She initially had more abdominal cramping.  She does not have any fever or vomiting -We recommended ER evaluation to husband if she has any persistent or worsening abdominal pain or she develops any new symptoms such as fever, vomiting, localization of pain or other concerns.  May need abdominal imaging for any persistent or worsening pain     I discussed the assessment and treatment plan with the patient. The patient was provided an opportunity to ask questions and all were answered. The patient agreed with the plan and demonstrated an understanding of the instructions.   The patient was advised to call back or seek an in-person evaluation if the symptoms worsen or if the condition fails to improve as anticipated.  I provided 25 minutes of non-face-to-face time during this encounter.   Evelena PeatBruce Burchette, MD

## 2018-07-30 NOTE — Telephone Encounter (Signed)
Copied from CRM [id number]. Topic: General - Other >> Jul 29, 2018  5:01 PM Tamela Oddi wrote: Reason for CRM: Patient's husband called to speak with the nurse or doctor regarding a photo of the patient's lip that was sent to the doctor.  Husband stated that the patient is feeling worse and she also has belly pain that he feels has gotten worse.  Patient would like a call first thing in the morning to discuss.  CB# [phone]

## 2018-07-30 NOTE — ED Notes (Signed)
Attempted to obtain two sets of blood cultures, but was only able to get one. One set of blood cultures obtained prior to starting antibiotics.

## 2018-07-30 NOTE — Telephone Encounter (Signed)
Pt husband called back requesting to speak with Dr. Caryl Never because his wife is not getting better. Pt husband will contact EMS

## 2018-07-30 NOTE — ED Notes (Signed)
Bed: WA09 Expected date:  Expected time:  Means of arrival:  Comments: 63 yr old abdominal pain, leg pain

## 2018-07-30 NOTE — Telephone Encounter (Signed)
Called patient and spoke to husband and he stated that her diarrhea is improving but her abdominal pain is very severe and she was not able to sleep last night and he is really worried about her. I have set up a follow up at 8:45am this morning.

## 2018-07-30 NOTE — ED Notes (Addendum)
CRITICAL VALUE STICKER  CRITICAL VALUE: Lactic 4.7  RECEIVER (on-site recipient of call): Leta Jungling T RN  DATE & TIME NOTIFIED: 07/30/18 1018p  MESSENGER (representative from lab): Amy   MD NOTIFIED: Lawyer PA  TIME OF NOTIFICATION: 1018p  RESPONSE: see orders

## 2018-07-30 NOTE — Anesthesia Preprocedure Evaluation (Addendum)
Anesthesia Evaluation  Patient identified by MRN, date of birth, ID band Patient awake    Reviewed: Allergy & Precautions, H&P , NPO status , Patient's Chart, lab work & pertinent test results  Airway Mallampati: II  TM Distance: >3 FB Neck ROM: Full    Dental no notable dental hx. (+) Teeth Intact, Dental Advisory Given   Pulmonary neg pulmonary ROS, former smoker,    Pulmonary exam normal breath sounds clear to auscultation       Cardiovascular negative cardio ROS   Rhythm:Regular Rate:Normal     Neuro/Psych  Neuromuscular disease negative psych ROS   GI/Hepatic negative GI ROS, Neg liver ROS,   Endo/Other  negative endocrine ROS  Renal/GU negative Renal ROS  negative genitourinary   Musculoskeletal  (+) Arthritis ,   Abdominal   Peds  Hematology negative hematology ROS (+)   Anesthesia Other Findings   Reproductive/Obstetrics negative OB ROS                            Anesthesia Physical Anesthesia Plan  ASA: II and emergent  Anesthesia Plan: General   Post-op Pain Management:    Induction: Intravenous, Rapid sequence and Cricoid pressure planned  PONV Risk Score and Plan: 4 or greater and Ondansetron, Dexamethasone and Midazolam  Airway Management Planned: Oral ETT  Additional Equipment:   Intra-op Plan:   Post-operative Plan: Extubation in OR  Informed Consent: I have reviewed the patients History and Physical, chart, labs and discussed the procedure including the risks, benefits and alternatives for the proposed anesthesia with the patient or authorized representative who has indicated his/her understanding and acceptance.     Dental advisory given  Plan Discussed with: CRNA  Anesthesia Plan Comments:         Anesthesia Quick Evaluation

## 2018-07-30 NOTE — ED Notes (Signed)
Patient transported to CT 

## 2018-07-30 NOTE — Telephone Encounter (Signed)
Don't think so- but if not helping her abdominal cramps would leave off the Bentyl.  Correcting her electrolytes will take some time.  Continue with the Gatorade.  We need to repeat her BMP either tomorrow or Friday.

## 2018-07-30 NOTE — Telephone Encounter (Signed)
Discussed with husband at visit.  He is instructed to take her to ER for any persistent or worsening abdominal pain.

## 2018-07-30 NOTE — H&P (Signed)
Natasha Frederick  02/11/1956 [id number]  CARE TEAM:  PCP: Natasha Covey, MD  Outpatient Care Team: Patient Care Team: Natasha Covey, MD as PCP - General (Family Medicine) Natasha Covey, MD  Inpatient Treatment Team: Treatment Team: Attending Provider: Gwyneth Sprout, MD; Physician Assistant: Natasha Frederick; Registered Nurse: Natasha Givens, RN; Technician: Natasha Frederick, NT   This patient is a 63 y.o.female who presents today for surgical evaluation at the request of Natasha Frederick.   Chief complaint / Reason for evaluation: Abdominal pain with pneumoperitoneum.  Probable perforated gastric ulcer  63 year old woman with significant history of scoliosis and osteoarthritis.  Struggles with chronic pain.  Usually takes ibuprofen.  For the past couple of weeks she has had some persistent abdominal pain.  Crampy.  Some loose bowel movements.  Decreased appetite.  No definite emesis.  Was on clindamycin for some tooth issue followed by her dentist.  Had some loose stools.  C. difficile work-up was underwhelming.  Following with primary care physician.  Called earlier today noting that the diarrhea is less but the abdominal pain was worse.  Because of intensified, came emergency room.  Increased respiratory rate and discomfort.  Elevated creatinine.  CT scan shows large volume of free air primarily focused in upper abdomen.  Perforated stomach ulcer suspected.  Surgical consultation requested.  Nurse in room.  Patient very worried about missing her medications for Huntington's disease.  She does have some tingling in her legs chronically.  She is not on blood thinners.  She does have a history of smoking but has not smoked in years.  She had appendectomy but does not recall any other abdominal surgeries.  She can walk without a walker.  Her husband helps take care of her fair amount is very involved.   Assessment  Natasha Frederick  63 y.o. female       Problem List:   Principal Problem:   Pneumoperitoneum Active Problems:   Huntington disease (HCC)   Scoliosis of lumbar spine   Primary osteoarthritis of left hip   Long term current use of non-steroidal anti-inflammatories (NSAID)   Pneumoperitoneum primarily focused in the upper abdomen with inflammation of the stomach.  History of heavy nonsteroidal use.  Agree with suspicion by history and CT scan for perforated ulcer.  Plan:  IV fluids.  IV antibiotics.  Emergent operation.  Start out laparoscopically.  Washout with omental Natasha Frederick patch for probable perforated gastric ulcer.  Hartmann resection if perforated colon.  Rule out small bowel perforated diverticulum, etc.  May have to use open if she becomes hypotensive or technically not feasible.  Discussed at length with the patient and then discussed again at length with the patient's husband on the phone that he cannot come to the emergency department due to social disability protocols with the COVID pandemic.    The anatomy & physiology of the digestive tract was discussed.  The pathophysiology of perforation was discussed.  Differential diagnosis such as perforated ulcer or colon, etc was discussed.   Natural history risks without surgery such as death was discussed.  I recommended abdominal exploration to diagnose & treat the source of the problem.  Laparoscopic & open techniques were discussed.   Risks such as bleeding, infection, abscess, leak, reoperation, bowel resection, possible ostomy, injury to other organs, need for repair of tissues / organs, hernia, heart attack, death, and other risks were discussed.   The risks of no intervention will lead to serious problems  including death.   I expressed a good likelihood that surgery will address the problem.    Goals of post-operative recovery were discussed as well.  We will work to minimize complications although risks in an emergent setting are high.   Questions were answered.  The patient expressed  understanding & wishes to proceed with surgery.         They are very worried about her missing her anti-Huntington's disease neurological pills.  I did caution that not going to be very feasible from pills to work with her having a hole in her digestive tract requiring emergency surgery, but we will see.  Low threshold to involve medicine for management it may be by neurology if needed.  Acute kidney failure most likely related to dehydration.  Hopefully will resolve with resolution of source of problem and gentle hydration.  Try not to go too far with her age.  She does not have any definite evidence of cardiac or pulmonary issues.  -VTE prophylaxis- SCDs, etc -mobilize as tolerated to help recovery  55 minutes spent in review, evaluation, examination, counseling, and coordination of care.  More than 50% of that time was spent in counseling.  Natasha SportsmanSteven C. Gross, MD, FACS, MASCRS Gastrointestinal and Minimally Invasive Surgery    [address] [place], [place] [id number] [phone] Main / Paging [phone] Fax   07/30/2018      Past Medical History:  Diagnosis Date  . Arthritis    hip  . Endometriosis   . Scoliosis     Past Surgical History:  Procedure Laterality Date  . ANTERIOR LAT LUMBAR FUSION Left 01/05/2014   Procedure: LUMBAR TWO TO THREE, LUMBAR LUMBAR THREE TO FOUR, ANTERIOR LATERAL LUMBAR FUSION 2 LEVELS;  Surgeon: Natasha Meekerandy O Kritzer, MD;  Location: [facility];  Service: Neurosurgery;  Laterality: Left;  . APPENDECTOMY  1963  . BACK SURGERY    . BREAST SURGERY Left 1987   bx  . COLONOSCOPY W/ POLYPECTOMY    . EYE SURGERY    . OOPHORECTOMY  1977   left  . Planters wart Left 2005  . radial keratoto Bilateral   . TONSILLECTOMY  1958  . TOTAL HIP ARTHROPLASTY Left 07/19/2014   Procedure: TOTAL HIP ARTHROPLASTY;  Surgeon: Gean BirchwoodFrank Rowan, MD;  Location: [facility];  Service: Orthopedics;  Laterality: Left;  . TUBAL LIGATION  1986    Social  History   Socioeconomic History  . Marital status: Married    Spouse name: Not on file  . Number of children: Not on file  . Years of education: Not on file  . Highest education level: Not on file  Occupational History  . Occupation: unemployed    Comment: disabled back pain  Social Needs  . Financial resource strain: Not on file  . Food insecurity:    Worry: Not on file    Inability: Not on file  . Transportation needs:    Medical: Not on file    Non-medical: Not on file  Tobacco Use  . Smoking status: Former Smoker    Packs/day: 1.00    Years: 8.00    Pack years: 8.00    Types: Cigarettes    Last attempt to quit: 01/17/1994    Years since quitting: 24.5  . Smokeless tobacco: Never Used  Substance and Sexual Activity  . Alcohol use: Yes    Alcohol/week: 3.0 standard drinks    Types: 3 Cans of beer per week    Comment:  per week  . Drug use: No  . Sexual activity: Not on file  Lifestyle  . Physical activity:    Days per week: Not on file    Minutes per session: Not on file  . Stress: Not on file  Relationships  . Social connections:    Talks on phone: Not on file    Gets together: Not on file    Attends religious service: Not on file    Active member of club or organization: Not on file    Attends meetings of clubs or organizations: Not on file    Relationship status: Not on file  . Intimate partner violence:    Fear of current or ex partner: Not on file    Emotionally abused: Not on file    Physically abused: Not on file    Forced sexual activity: Not on file  Other Topics Concern  . Not on file  Social History Narrative   ** Merged History Encounter **        Family History  Problem Relation Age of Onset  . Lung cancer Mother   . Stroke Father   . Huntington's disease Paternal Grandmother   . Huntington's disease Paternal Aunt   . Huntington's disease Paternal Uncle   . Arthritis Neg Hx        family hx  . Cancer Neg Hx        prostate ca -family  hx  . Heart disease Neg Hx        family hx    Current Facility-Administered Medications  Medication Dose Route Frequency Provider Last Rate Last Dose  . ceFEPIme (MAXIPIME) 2 g in sodium chloride 0.9 % 100 mL IVPB  2 g Intravenous Once Lawyer, Christopher, PA-C      . metroNIDAZOLE (FLAGYL) IVPB 500 mg  500 mg Intravenous Once Charlestine Night, PA-C       Current Outpatient Medications  Medication Sig Dispense Refill  . Deutetrabenazine (AUSTEDO) 12 MG TABS Take 24 mg by mouth 2 (two) times daily. 360 tablet 1  . dicyclomine (BENTYL) 10 MG capsule Take 1 capsule (10 mg total) by mouth 4 (four) times daily -  before meals and at bedtime. 30 capsule 0  . ibuprofen (ADVIL,MOTRIN) 800 MG tablet Take 800 mg by mouth every 8 (eight) hours as needed. for pain  0     Allergies  Allergen Reactions  . Bee Venom Anaphylaxis and Swelling  . Adhesive [Tape] Other (See Comments)    redness  . Bee Venom   . Codeine   . Penicillins   . Sulfa Antibiotics     ROS:   All other systems reviewed & are negative except per HPI or as noted below: Constitutional:  No fevers, chills, sweats.  Weight stable Eyes:  No vision changes, No discharge HENT:  No sore throats, nasal drainage Lymph: No neck swelling, No bruising easily Pulmonary:  No cough, productive sputum CV: No orthopnea, PND  Patient walks 10 minutes slowly but steadily.  No exertional chest/neck/shoulder/arm pain. GI: No personal nor family history of GI/colon cancer, inflammatory bowel disease, irritable bowel syndrome, allergy such as Celiac Sprue, dietary/dairy problems, colitis, ulcers nor gastritis.  No recent sick contacts/gastroenteritis.  No travel outside the country.  No changes in diet. Renal: No UTIs, No hematuria Genital:  No drainage, bleeding, masses Musculoskeletal: No severe joint pain.  Good ROM major joints Skin:  No sores or lesions.  No rashes Heme/Lymph:  No easy bleeding.  No  swollen lymph nodes Neuro: No focal  weakness/numbness.  No seizures Psych: No suicidal ideation.  No hallucinations  BP 112/78 (BP Location: Right Arm)   Pulse 69   Temp 97.9 F (36.6 C) (Oral)   Resp (!) 23   Ht  (1.626 m)   Wt 61.2 kg   SpO2 100%   BMI 23.17 kg/m   Physical Exam: General: Pt awake/alert/oriented x4 in moderate acute distress Eyes: PERRL, normal EOM. Sclera nonicteric Neuro: CN II-XII intact w/o focal sensory/motor deficits. Lymph: No head/neck/groin lymphadenopathy Psych:  No delerium/psychosis/paranoia HENT: Normocephalic, Mucus membranes moist.  No thrush Neck: Supple, No tracheal deviation Chest: No pain.  Good respiratory excursion. CV:  Pulses intact.  Regular rhythm Abdomen: Soft, moderately distended.  Tenderness especially epigastric region.  Infraumbilically not tender.  Some focal irritation with bed shake and percussion.  Old right lower quadrant transverse incision consistent with prior open appendectomy.  No incisional or palpable hernias . Gen:  No inguinal hernias.  No inguinal lymphadenopathy.   Ext:  SCDs BLE.  No significant edema.  No cyanosis Skin: No petechiae / purpurea.  No major sores Musculoskeletal: No severe joint pain.  Moderate scoliosis of the spine.  good ROM major joints   Results:   Labs: Results for orders placed or performed during the hospital encounter of 07/30/18 (from the past 48 hour(s))  Comprehensive metabolic panel     Status: Abnormal   Collection Time: 07/30/18  8:01 PM  Result Value Ref Range   Sodium 123 (L) 135 - 145 mmol/L   Potassium 4.4 3.5 - 5.1 mmol/L   Chloride 91 (L) 98 - 111 mmol/L   CO2 15 (L) 22 - 32 mmol/L   Glucose, Bld 184 (H) 70 - 99 mg/dL   BUN 18 8 - 23 mg/dL   Creatinine, Ser 4.09 (H) 0.44 - 1.00 mg/dL   Calcium 8.2 (L) 8.9 - 10.3 mg/dL   Total Protein 5.8 (L) 6.5 - 8.1 g/dL   Albumin 3.2 (L) 3.5 - 5.0 g/dL   AST 44 (H) 15 - 41 U/L   ALT 23 0 - 44 U/L   Alkaline Phosphatase 52 38 - 126 U/L   Total Bilirubin 0.8  0.3 - 1.2 mg/dL   GFR calc non Af Amer 28 (L) >60 mL/min   GFR calc Af Amer 33 (L) >60 mL/min   Anion gap 17 (H) 5 - 15    Comment: Performed at [facility], [address] [place], [place] [address]  Lipase, blood     Status: Abnormal   Collection Time: 07/30/18  8:01 PM  Result Value Ref Range   Lipase 130 (H) 11 - 51 U/L    Comment: Performed at [facility], [address], [place], [place] [address]  CBC with Differential     Status: Abnormal   Collection Time: 07/30/18  8:01 PM  Result Value Ref Range   WBC 4.6 4.0 - 10.5 K/uL   RBC 5.20 (H) 3.87 - 5.11 MIL/uL   Hemoglobin 15.1 (H) 12.0 - 15.0 g/dL   HCT 56.2 (H) 13.0 - 86.5 %   MCV 89.4 80.0 - 100.0 fL   MCH 29.0 26.0 - 34.0 pg   MCHC 32.5 30.0 - 36.0 g/dL   RDW 78.4 69.6 - 29.5 %   Platelets 372 150 - 400 K/uL   nRBC 0.0 0.0 - 0.2 %   Neutrophils Relative % 71 %   Neutro Abs 4.3 1.7 -  7.7 K/uL   Band Neutrophils 22 %   Lymphocytes Relative 5 %   Lymphs Abs 0.2 (L) 0.7 - 4.0 K/uL   Monocytes Relative 2 %   Monocytes Absolute 0.1 0.1 - 1.0 K/uL   Eosinophils Relative 0 %   Eosinophils Absolute 0.0 0.0 - 0.5 K/uL   Basophils Relative 0 %   Basophils Absolute 0.0 0.0 - 0.1 K/uL   WBC Morphology MILD LEFT SHIFT (1-5% METAS, OCC MYELO, OCC BANDS)     Comment: BANDS PRESENT   Abs Immature Granulocytes 0.00 0.00 - 0.07 K/uL    Comment: Performed at [facility], [address] [place], [place] [address]    Imaging / Studies: Ct Abdomen Pelvis W Contrast  Result Date: 07/30/2018 CLINICAL DATA:  Generalized abdominal pain, left lower quadrant pain. EXAM: CT ABDOMEN AND PELVIS WITH CONTRAST TECHNIQUE: Multidetector CT imaging of the abdomen and pelvis was performed using the standard protocol following bolus administration of intravenous contrast. CONTRAST:  OMNIPAQUE IOHEXOL 300 MG/ML  SOLN COMPARISON:  None FINDINGS: Lower chest: Small bilateral pleural  effusions. Bibasilar atelectasis. Heart is normal size. Hepatobiliary: No focal hepatic abnormality. Gallbladder unremarkable. Pancreas: No focal abnormality or ductal dilatation. Spleen: No focal abnormality.  Normal size. Adrenals/Urinary Tract: Wedge-shaped low-density and area of decreased enhancement in the midpole of the right kidney could reflect renal infarct. No hydronephrosis. Adrenal glands and left kidney unremarkable. Urinary bladder grossly unremarkable. Stomach/Bowel: Gastric wall appears thickened in the distal stomach region. Small bowel is decompressed and grossly unremarkable. Left colonic diverticulosis. No active diverticulitis. Moderate stool burden in the colon. Vascular/Lymphatic: Aortic atherosclerosis. No enlarged abdominal or pelvic lymph nodes. Reproductive: No visible pelvic masses. Beam hardening artifact from the left hip replacement obscures lower pelvic structures. Other: Large volume ascites and large amount of pneumoperitoneum noted in the abdomen. Exact source is difficult to determine. The mid to distal gastric wall does appear thickened possibly related to gastritis on coronal image 47, there is gas collection noted which appears to be along the lesser curvature of the stomach, possibly within the wall, possibly representing large ulceration. Musculoskeletal: Postoperative fusion changes in the lumbar spine. Diffuse degenerative disc disease changes. No acute bony abnormality. Prior left hip replacement. IMPRESSION: Large volume pneumoperitoneum and ascites in the abdomen. Exact source is difficult to determine, but favor gastric. The mid to distal gastric wall appears thickened and there may be large ulceration along the lesser curvature. Left colonic diverticulosis.  No active diverticulitis. Aortic atherosclerosis. Wedge-shaped low-density area in the midpole of the right kidney may reflect renal infarct. Small bilateral pleural effusions, bibasilar atelectasis. These results  were called by telephone at the time of interpretation on 07/30/2018 at 9:16 pm to [facility] , who verbally acknowledged these results. Electronically Signed   By: Charlett Nose M.D.   On: 07/30/2018 21:18    Medications / Allergies: per chart  Antibiotics: Anti-infectives (From admission, onward)   Start     Dose/Rate Route Frequency Ordered Stop   07/30/18 2130  ceFEPIme (MAXIPIME) 2 g in sodium chloride 0.9 % 100 mL IVPB     2 g 200 mL/hr over 30 Minutes Intravenous  Once 07/30/18 2129     07/30/18 2130  metroNIDAZOLE (FLAGYL) IVPB 500 mg     500 mg 100 mL/hr over 60 Minutes Intravenous  Once 07/30/18 2129          Note: Portions of this report may have been transcribed using voice recognition software. Every  effort was made to ensure accuracy; however, inadvertent computerized transcription errors may be present.   Any transcriptional errors that result from this process are unintentional.    Natasha Sportsman, MD, FACS, MASCRS Gastrointestinal and Minimally Invasive Surgery    [address], [address] [place], [place] [address] [phone] Main / Paging [phone] Fax   07/30/2018

## 2018-07-30 NOTE — ED Notes (Signed)
Dr. [name] at bedside with patient. Paper consent at bedside.

## 2018-07-30 NOTE — ED Triage Notes (Addendum)
Per EMS, patient coming from home with complaints of LLQ abdominal pain and diarrhea for the past two weeks. Patient was on antibiotics for a mouth sore and just finished a few days ago. Patient seen by PCP for this problem and was told that her sodium is low. Patient is also complaining of bilateral pain in her legs and feet, described as a cramping sensation which has limited mobility.

## 2018-07-31 ENCOUNTER — Encounter (HOSPITAL_COMMUNITY): Payer: Self-pay | Admitting: Surgery

## 2018-07-31 ENCOUNTER — Inpatient Hospital Stay (HOSPITAL_COMMUNITY): Payer: Medicare Other

## 2018-07-31 ENCOUNTER — Other Ambulatory Visit: Payer: Federal, State, Local not specified - PPO

## 2018-07-31 ENCOUNTER — Telehealth: Payer: Self-pay | Admitting: Family Medicine

## 2018-07-31 DIAGNOSIS — K653 Choleperitonitis: Secondary | ICD-10-CM

## 2018-07-31 DIAGNOSIS — K255 Chronic or unspecified gastric ulcer with perforation: Secondary | ICD-10-CM

## 2018-07-31 LAB — CBC
HCT: 40.3 % (ref 36.0–46.0)
Hemoglobin: 13.3 g/dL (ref 12.0–15.0)
MCH: 29.2 pg (ref 26.0–34.0)
MCHC: 33 g/dL (ref 30.0–36.0)
MCV: 88.6 fL (ref 80.0–100.0)
Platelets: 256 10*3/uL (ref 150–400)
RBC: 4.55 MIL/uL (ref 3.87–5.11)
RDW: 12.7 % (ref 11.5–15.5)
WBC: 4.4 10*3/uL (ref 4.0–10.5)
nRBC: 0 % (ref 0.0–0.2)

## 2018-07-31 LAB — BASIC METABOLIC PANEL
Anion gap: 12 (ref 5–15)
BUN: 22 mg/dL (ref 8–23)
CO2: 17 mmol/L — ABNORMAL LOW (ref 22–32)
Calcium: 7.4 mg/dL — ABNORMAL LOW (ref 8.9–10.3)
Chloride: 95 mmol/L — ABNORMAL LOW (ref 98–111)
Creatinine, Ser: 1.53 mg/dL — ABNORMAL HIGH (ref 0.44–1.00)
GFR calc Af Amer: 41 mL/min — ABNORMAL LOW (ref >60)
GFR calc non Af Amer: 35 mL/min — ABNORMAL LOW (ref >60)
Glucose, Bld: 112 mg/dL — ABNORMAL HIGH (ref 70–99)
Potassium: 4.6 mmol/L (ref 3.5–5.1)
Sodium: 124 mmol/L — ABNORMAL LOW (ref 135–145)

## 2018-07-31 LAB — MAGNESIUM: Magnesium: 1.4 mg/dL — ABNORMAL LOW (ref 1.7–2.4)

## 2018-07-31 LAB — MRSA PCR SCREENING: MRSA by PCR: NEGATIVE

## 2018-07-31 LAB — LACTIC ACID, PLASMA: Lactic Acid, Venous: 2.1 mmol/L (ref 0.5–1.9)

## 2018-07-31 MED ORDER — DEUTETRABENAZINE 12 MG PO TABS
24.0000 mg | ORAL_TABLET | Freq: Two times a day (BID) | ORAL | Status: DC
  Administered 2018-08-01 – 2018-08-13 (×22): 24 mg via ORAL
  Filled 2018-07-31 (×15): qty 2

## 2018-07-31 MED ORDER — ACETAMINOPHEN 650 MG RE SUPP: 650.0000 mg | Freq: Four times a day (QID) | RECTAL | Status: DC | PRN

## 2018-07-31 MED ORDER — DEXAMETHASONE SODIUM PHOSPHATE 10 MG/ML IJ SOLN
INTRAMUSCULAR | Status: DC | PRN
  Administered 2018-07-31: 5 mg via INTRAVENOUS

## 2018-07-31 MED ORDER — ENOXAPARIN SODIUM 40 MG/0.4ML ~~LOC~~ SOLN
40.0000 mg | SUBCUTANEOUS | Status: DC
  Administered 2018-08-01 – 2018-08-07 (×7): 40 mg via SUBCUTANEOUS
  Filled 2018-07-31 (×7): qty 0.4

## 2018-07-31 MED ORDER — ONDANSETRON HCL 4 MG/2ML IJ SOLN
4.0000 mg | Freq: Three times a day (TID) | INTRAMUSCULAR | Status: DC | PRN
  Administered 2018-08-01 – 2018-08-10 (×4): 4 mg via INTRAVENOUS
  Filled 2018-07-31 (×3): qty 2

## 2018-07-31 MED ORDER — ALBUMIN HUMAN 5 % IV SOLN
12.5000 g | Freq: Four times a day (QID) | INTRAVENOUS | Status: AC | PRN
  Filled 2018-07-31: qty 250

## 2018-07-31 MED ORDER — METRONIDAZOLE IN NACL 5-0.79 MG/ML-% IV SOLN
500.0000 mg | Freq: Four times a day (QID) | INTRAVENOUS | Status: AC
  Administered 2018-07-31 – 2018-08-01 (×5): 500 mg via INTRAVENOUS
  Filled 2018-07-31 (×5): qty 100

## 2018-07-31 MED ORDER — DIPHENHYDRAMINE HCL 50 MG/ML IJ SOLN
12.5000 mg | Freq: Four times a day (QID) | INTRAMUSCULAR | Status: DC | PRN
  Administered 2018-08-01 – 2018-08-04 (×3): 12.5 mg via INTRAVENOUS
  Filled 2018-07-31 (×3): qty 1

## 2018-07-31 MED ORDER — PANTOPRAZOLE SODIUM 40 MG IV SOLR
40.0000 mg | Freq: Two times a day (BID) | INTRAVENOUS | Status: DC
  Administered 2018-07-31 – 2018-08-04 (×10): 40 mg via INTRAVENOUS
  Filled 2018-07-31 (×10): qty 40

## 2018-07-31 MED ORDER — BUPIVACAINE-EPINEPHRINE 0.25% -1:200000 IJ SOLN
INTRAMUSCULAR | Status: DC | PRN
  Administered 2018-07-31: 30 mL

## 2018-07-31 MED ORDER — LIP MEDEX EX OINT
1.0000 "application " | TOPICAL_OINTMENT | Freq: Two times a day (BID) | CUTANEOUS | Status: DC
  Administered 2018-07-31 – 2018-08-13 (×25): 1 via TOPICAL
  Filled 2018-07-31 (×6): qty 7

## 2018-07-31 MED ORDER — SUGAMMADEX SODIUM 200 MG/2ML IV SOLN
INTRAVENOUS | Status: DC | PRN
  Administered 2018-07-31: 200 mg via INTRAVENOUS

## 2018-07-31 MED ORDER — HYDROMORPHONE HCL 1 MG/ML IJ SOLN
0.2500 mg | INTRAMUSCULAR | Status: DC | PRN
  Administered 2018-07-31 (×4): 0.25 mg via INTRAVENOUS

## 2018-07-31 MED ORDER — BISACODYL 10 MG RE SUPP: 10.0000 mg | Freq: Every day | RECTAL | Status: DC | PRN

## 2018-07-31 MED ORDER — SODIUM CHLORIDE 0.9% FLUSH: 10.0000 mL | INTRAVENOUS | Status: DC | PRN

## 2018-07-31 MED ORDER — SODIUM CHLORIDE 0.9% FLUSH
10.0000 mL | Freq: Two times a day (BID) | INTRAVENOUS | Status: DC
  Administered 2018-07-31 – 2018-08-07 (×11): 10 mL
  Administered 2018-08-07 – 2018-08-08 (×2): 3 mL
  Administered 2018-08-09 – 2018-08-13 (×9): 10 mL

## 2018-07-31 MED ORDER — SODIUM CHLORIDE 0.9 % IV SOLN
INTRAVENOUS | Status: DC | PRN
  Administered 2018-07-31: 1000 mL

## 2018-07-31 MED ORDER — MAGNESIUM SULFATE 4 GM/100ML IV SOLN
4.0000 g | Freq: Once | INTRAVENOUS | Status: AC
  Administered 2018-07-31: 4 g via INTRAVENOUS
  Filled 2018-07-31: qty 100

## 2018-07-31 MED ORDER — MAGIC MOUTHWASH
15.0000 mL | Freq: Four times a day (QID) | ORAL | Status: DC | PRN
  Filled 2018-07-31 (×2): qty 15

## 2018-07-31 MED ORDER — SIMETHICONE 80 MG PO CHEW: 40.0000 mg | CHEWABLE_TABLET | Freq: Four times a day (QID) | ORAL | Status: DC | PRN

## 2018-07-31 MED ORDER — ACETAMINOPHEN 10 MG/ML IV SOLN
1000.0000 mg | Freq: Four times a day (QID) | INTRAVENOUS | Status: AC
  Administered 2018-07-31 – 2018-08-01 (×4): 1000 mg via INTRAVENOUS
  Filled 2018-07-31 (×5): qty 100

## 2018-07-31 MED ORDER — LACTATED RINGERS IV SOLN: 1000.0000 mL | Freq: Three times a day (TID) | INTRAVENOUS | Status: AC | PRN

## 2018-07-31 MED ORDER — SODIUM CHLORIDE 0.9 % IV SOLN
INTRAVENOUS | Status: DC
  Administered 2018-07-31 – 2018-08-01 (×2): via INTRAVENOUS

## 2018-07-31 MED ORDER — HYDROMORPHONE HCL 1 MG/ML IJ SOLN
0.5000 mg | INTRAMUSCULAR | Status: DC | PRN
  Administered 2018-07-31 (×3): 1 mg via INTRAVENOUS
  Administered 2018-07-31: 2 mg via INTRAVENOUS
  Administered 2018-08-01 (×2): 0.5 mg via INTRAVENOUS
  Administered 2018-08-02: 1 mg via INTRAVENOUS
  Filled 2018-07-31 (×5): qty 1
  Filled 2018-07-31 (×2): qty 2
  Filled 2018-07-31: qty 1

## 2018-07-31 MED ORDER — METOPROLOL TARTRATE 5 MG/5ML IV SOLN: 5.0000 mg | Freq: Four times a day (QID) | INTRAVENOUS | Status: DC | PRN

## 2018-07-31 MED ORDER — ONDANSETRON HCL 4 MG/2ML IJ SOLN
INTRAMUSCULAR | Status: DC | PRN
  Administered 2018-07-31: 4 mg via INTRAVENOUS

## 2018-07-31 MED ORDER — ORAL CARE MOUTH RINSE
15.0000 mL | Freq: Two times a day (BID) | OROMUCOSAL | Status: DC
  Administered 2018-08-01 – 2018-08-13 (×21): 15 mL via OROMUCOSAL

## 2018-07-31 MED ORDER — SODIUM CHLORIDE 0.9 % IV SOLN
2.0000 g | Freq: Every day | INTRAVENOUS | Status: AC
  Administered 2018-07-31 – 2018-08-04 (×5): 2 g via INTRAVENOUS
  Filled 2018-07-31: qty 2
  Filled 2018-07-31 (×3): qty 20
  Filled 2018-07-31: qty 2
  Filled 2018-07-31: qty 20

## 2018-07-31 MED ORDER — HYDRALAZINE HCL 20 MG/ML IJ SOLN: 5.0000 mg | INTRAMUSCULAR | Status: DC | PRN

## 2018-07-31 MED ORDER — METHOCARBAMOL 1000 MG/10ML IJ SOLN
500.0000 mg | Freq: Four times a day (QID) | INTRAVENOUS | Status: DC | PRN
  Administered 2018-08-02 – 2018-08-03 (×2): 500 mg via INTRAVENOUS
  Filled 2018-07-31 (×7): qty 5

## 2018-07-31 MED ORDER — LACTATED RINGERS IV SOLN
INTRAVENOUS | Status: DC
  Administered 2018-07-31: 03:00:00 via INTRAVENOUS

## 2018-07-31 MED ORDER — CHLORHEXIDINE GLUCONATE CLOTH 2 % EX PADS
6.0000 | MEDICATED_PAD | Freq: Every day | CUTANEOUS | Status: DC
  Administered 2018-08-01 – 2018-08-07 (×7): 6 via TOPICAL

## 2018-07-31 MED ORDER — HYDROMORPHONE HCL 1 MG/ML IJ SOLN
INTRAMUSCULAR | Status: AC
  Administered 2018-07-31: 0.25 mg via INTRAVENOUS
  Filled 2018-07-31: qty 1

## 2018-07-31 NOTE — Anesthesia Postprocedure Evaluation (Signed)
Anesthesia Post Note  Patient: Natasha Frederick  Procedure(s) Performed: DIAGNOSTIC LAPAROSCOPY, Omentopexy gram patch, Washout of intra-abdominal abcesses x 3 (N/A )     Patient location during evaluation: PACU Anesthesia Type: General Level of consciousness: awake and alert Pain management: pain level controlled Vital Signs Assessment: post-procedure vital signs reviewed and stable Respiratory status: spontaneous breathing, nonlabored ventilation, respiratory function stable and patient connected to nasal cannula oxygen Cardiovascular status: blood pressure returned to baseline and stable Postop Assessment: no apparent nausea or vomiting Anesthetic complications: no    Last Vitals:  Vitals:   07/31/18 0400 07/31/18 0600  BP: 135/78 (!) 156/129  Pulse: 96 99  Resp: 20 (!) 23  Temp:    SpO2: 99% 98%    Last Pain:  Vitals:   07/31/18 0430  TempSrc:   PainSc: Asleep                 FITZGERALD,W. EDMOND

## 2018-07-31 NOTE — Transfer of Care (Signed)
Immediate Anesthesia Transfer of Care Note  Patient: Natasha Frederick  Procedure(s) Performed: DIAGNOSTIC LAPAROSCOPY, Omentopexy gram patch, Washout of intra-abdominal abcesses x 3 (N/A )  Patient Location: PACU  Anesthesia Type:General  Level of Consciousness: sedated and responds to stimulation  Airway & Oxygen Therapy: Patient Spontanous Breathing and Patient connected to face mask oxygen  Post-op Assessment: Report given to RN and Post -op Vital signs reviewed and stable  Post vital signs: Reviewed and stable  Last Vitals:  Vitals Value Taken Time  BP 128/84 07/31/2018 12:56 AM  Temp    Pulse 61 07/31/2018 12:55 AM  Resp 29 07/31/2018 12:58 AM  SpO2 83 % 07/31/2018 12:55 AM  Vitals shown include unvalidated device data.  Last Pain:  Vitals:   07/30/18 2206  TempSrc:   PainSc: 8          Complications: No apparent anesthesia complications

## 2018-07-31 NOTE — Op Note (Signed)
07/30/2018 - 07/31/2018  12:52 AM  PATIENT:  Natasha Frederick  63 y.o. female  Patient Care Team: Kristian Covey, MD as PCP - General (Family Medicine) Aliene Beams, MD as Referring Physician (Neurosurgery) Tat, Octaviano Batty, DO as Consulting Physician (Neurology)  PRE-OPERATIVE DIAGNOSIS:  Free Air  POST-OPERATIVE DIAGNOSIS:   Perforated gastric ulcer Biliary Peritonitis with Intra-abdominal abcesses.    PROCEDURE:  OMENTAL (GRAHAM) PATCH LAPAROSCOPY DIAGNOSTIC WASHOUT/DRAINAGE OF INTRAABDOMINAL ASBCESSES  SURGEON:  Ardeth Sportsman, MD  ASSISTANT: OR Staff   ANESTHESIA:   local and general  EBL:  Total I/O In: 1350 [I.V.:1000; IV Piggyback:350] Out: 150 [Urine:100; Blood:50]  Delay start of Pharmacological VTE agent (>24hrs) due to surgical blood loss or risk of bleeding:  no  DRAINS: 19Fr Blake surgical drains x 2  RLQ skin exit site: Runs down into the deep pelvis, ascends along the left paracolic gutter with tip retro-splenic in left upper quadrant  LUQ skin exit site: Runs along lesser curvature of stomach over omental patch.  Extends rightwards with tip  retrohepatic in Morison's pouch and right upper quadrant    SPECIMEN:  No Specimen  DISPOSITION OF SPECIMEN:  N/A  COUNTS:  YES  PLAN OF CARE: Admit to inpatient   PATIENT DISPOSITION:  PACU - guarded condition.  INDICATION:   Patient with evidence of free air and perforation.  Suspicion of perforated ulcer.  I recommended surgery:  The anatomy & physiology of the digestive tract was discussed.  The pathophysiology of perforation was discussed.  Differential diagnosis such as perforated ulcer or colon, etc was discussed.   Natural history risks without surgery such as death was discussed.  I recommended abdominal exploration to diagnose & treat the source of the problem.  Laparoscopic & open techniques were discussed.   Risks such as bleeding, infection, abscess, leak, reoperation, bowel resection, possible  ostomy, hernia, heart attack, death, and other risks were discussed.   The risks of no intervention will lead to serious problems including death.   I expressed a good likelihood that surgery will address the problem.    Goals of post-operative recovery were discussed as well.  We will work to minimize complications although risks in an emergent setting are high.   Questions were answered.  The patient expressed understanding & wishes to proceed with surgery.      OR FINDINGS: Diffuse peritonitis and biliary stained.  Purulent abscess collections retro-hepatic and Morison's pouch, retro-splenic and left upper quadrant, and deep pelvis.  Chronic interloop adhesions.  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  Inflamed smooth punched out 1 cm perforation with thickening of stomach wall consistent with probable perforated ulcer.  Omental patch done.  No evidence of metastatic disease or carcinomatosis or peritonitis.  DESCRIPTION:   Informed consent was confirmed.  The patient underwent general anaesthesia without difficulty.  The patient was positioned appropriately.  VTE prevention in place.  The patient's abdomen was clipped, prepped, & draped in a sterile fashion.  Surgical timeout confirmed our plan.  The patient was positioned in reverse Trendelenburg.  Abdominal entry with a 61mm port was gained using optical entry technique in the left upper abdomen.  Entry was clean.  I induced carbon dioxide insufflation.  Camera inspection revealed no injury.  Obvious bilious ascites with diffuse chronic peritonitis was noted.  Extra ports were carefully placed under direct laparoscopic visualization.  I aspirated 2 L of  bilious ascites with purulence retrohepatic, retro-splenic, and in deep pelvis loculated consistent with abscess formation.  Peritonitis seem to be more focused in the upper abdomen especially  along the lesser curvature of the stomach around the antrum.  The falciform ligament and some omentum was trying to wall off the area but there is still a 1 cm punched out ulcer that was not covered by the omentum nearby.  After aspirating all the obvious bilious in purulent ascites/abscesses, I did washout of 7 L of isotonic fluid.  This helped clean up things well.  Allowed me to break up numerous inflammatory interloop adhesions.  Essentially small bowel was frozen with a brain hold of inflammation and peel.  Gradually break that off with gentle hydrodissection and blunt dissection.  A pocket of trapped bilious ascites was noted at the jejunal ileal junction.  Had not formed a definite abscess yet.  I was able to irrigate and clear that up.  I placed another 1L irrigation of antibiotic solution (clindamycin/gentamicin).  I held that in place and focused on the omental patch.  I mobilized a tongue of greater omentum.  I laid it over the perforated ulcer.  I proceeded to do an omental patch by placing 2-0 V-lock stitches on the proximal side of the ulcer, through the omentum, distal side of the ulcer, and then came back as a large horizontal mattress suture.  I did another similar large horizontal mattress suture omental patch with another separate V lock suture.  Tied that down and it helped tack the omentum as a bulky plug of the perforation.  This is the classic [name] omental patch.  Ran the V locks through the omentum with another bite to to make sure that it was stuck down and trimmed off for 2 cm tails.  Aspirated the antibiotic irrigation.  We did copious irrigation of another 3 L of isotonic fluid with and reinspection of the abdominal cavity.  There is no frank evidence of appendicitis or diverticulitis.  No evidence of a perforated Meckel's diverticulum.  We carefully mobilized the omentum and small bowel, focusing along the stomach and duodenal sweep.  There were no more loculations.  Did a total of  11 L of irrigation.  Because of the diffuse chronic peritonitis with purulent abscesses retroperitoneal, retro-splenic, and deep pelvis; I decided to place drains as noted above so that it would help before his abscess cavity is drain out safely.  Secured the skin with 2-0 Prolene suture.  This was done under laparoscopic guidance.  The peritoneum was completely evacuated.  Ports removed.  Left upper quadrant 10 mm port site was closed at the fascia with 0 Vicryl on a UR 6 suture.  Her abdomen was rather thin-walled.  Sterile dressings applied.  Patient is extubated and is in recovery room.  She initially was hypotensive at the time of initial surgery and hypotension but they were able to rehydrate her and then she remained hemodynamically improved and was able to come off pressors halfway through the case and stayed off of that.  Felt to be wise to watch her in the stepdown unit at least overnight.  Anesthesia agreed.  I discussed operative findings, updated the patient's status, discussed probable steps to recovery, and gave postoperative recommendations to the patient's spouse.Melvyn Neth [phone].   Recommendations were made.  Questions were answered.  He expressed understanding & appreciation.     Ardeth Sportsman, M.D., F.A.C.S. Gastrointestinal and Minimally Invasive Surgery [facility] [phone]  [address] [place], [place] [id number] [phone] Main / Paging

## 2018-07-31 NOTE — Telephone Encounter (Signed)
Please see message. °

## 2018-07-31 NOTE — Progress Notes (Signed)
1 Day Post-Op   Subjective/Chief Complaint: Alert, confused, in restraints, pulled her ng tube   Objective: Vital signs in last 24 hours: Temp:  [94.3 F (34.6 C)-97.9 F (36.6 C)] 97.9 F (36.6 C) (04/23 0800) Pulse Rate:  [61-99] 99 (04/23 0600) Resp:  [18-30] 23 (04/23 0600) BP: (108-156)/(66-129) 156/129 (04/23 0600) SpO2:  [85 %-100 %] 98 % (04/23 0600) Weight:  [61.2 kg-64.2 kg] 64.2 kg (04/23 0300)    Intake/Output from previous day: 04/22 0701 - 04/23 0700 In: 2584.3 [I.V.:2134.3; IV Piggyback:450] Out: 845 [Urine:370; Emesis/NG output:25; Drains:400; Blood:50] Intake/Output this shift: No intake/output data recorded.  General alert, confused cv rrr Lungs clear abd approp tender incisions clean, drains with murky fluid both Ext min edema  Lab Results:  Recent Labs    07/30/18 2001 07/31/18 0344  WBC 4.6 4.4  HGB 15.1* 13.3  HCT 46.5* 40.3  PLT 372 256   BMET Recent Labs    07/30/18 2001 07/31/18 0344  NA 123* 124*  K 4.4 4.6  CL 91* 95*  CO2 15* 17*  GLUCOSE 184* 112*  BUN 18 22  CREATININE 1.84* 1.53*  CALCIUM 8.2* 7.4*   PT/INR No results for input(s): LABPROT, INR in the last 72 hours. ABG No results for input(s): PHART, HCO3 in the last 72 hours.  Invalid input(s): PCO2, PO2  Studies/Results: X-ray Abdomen Ap  Result Date: 07/31/2018 CLINICAL DATA:  NG tube placement EXAM: ABDOMEN - 1 VIEW COMPARISON:  None. FINDINGS: NG tube tip is in the mid stomach. Nonobstructive bowel gas pattern. Surgical drain noted within the abdomen. IMPRESSION: NG tube tip in the mid stomach. Electronically Signed   By: Charlett Nose M.D.   On: 07/31/2018 01:35   Ct Abdomen Pelvis W Contrast  Result Date: 07/30/2018 CLINICAL DATA:  Generalized abdominal pain, left lower quadrant pain. EXAM: CT ABDOMEN AND PELVIS WITH CONTRAST TECHNIQUE: Multidetector CT imaging of the abdomen and pelvis was performed using the standard protocol following bolus administration  of intravenous contrast. CONTRAST:  OMNIPAQUE IOHEXOL 300 MG/ML  SOLN COMPARISON:  None FINDINGS: Lower chest: Small bilateral pleural effusions. Bibasilar atelectasis. Heart is normal size. Hepatobiliary: No focal hepatic abnormality. Gallbladder unremarkable. Pancreas: No focal abnormality or ductal dilatation. Spleen: No focal abnormality.  Normal size. Adrenals/Urinary Tract: Wedge-shaped low-density and area of decreased enhancement in the midpole of the right kidney could reflect renal infarct. No hydronephrosis. Adrenal glands and left kidney unremarkable. Urinary bladder grossly unremarkable. Stomach/Bowel: Gastric wall appears thickened in the distal stomach region. Small bowel is decompressed and grossly unremarkable. Left colonic diverticulosis. No active diverticulitis. Moderate stool burden in the colon. Vascular/Lymphatic: Aortic atherosclerosis. No enlarged abdominal or pelvic lymph nodes. Reproductive: No visible pelvic masses. Beam hardening artifact from the left hip replacement obscures lower pelvic structures. Other: Large volume ascites and large amount of pneumoperitoneum noted in the abdomen. Exact source is difficult to determine. The mid to distal gastric wall does appear thickened possibly related to gastritis on coronal image 47, there is gas collection noted which appears to be along the lesser curvature of the stomach, possibly within the wall, possibly representing large ulceration. Musculoskeletal: Postoperative fusion changes in the lumbar spine. Diffuse degenerative disc disease changes. No acute bony abnormality. Prior left hip replacement. IMPRESSION: Large volume pneumoperitoneum and ascites in the abdomen. Exact source is difficult to determine, but favor gastric. The mid to distal gastric wall appears thickened and there may be large ulceration along the lesser curvature. Left colonic diverticulosis.  No active diverticulitis. Aortic atherosclerosis. Wedge-shaped  low-density area in the midpole of the right kidney may reflect renal infarct. Small bilateral pleural effusions, bibasilar atelectasis. These results were called by telephone at the time of interpretation on 07/30/2018 at 9:16 pm to [facility] , who verbally acknowledged these results. Electronically Signed   By: Charlett NoseKevin  Dover M.D.   On: 07/30/2018 21:18   Dg Chest Port 1 View  Result Date: 07/31/2018 CLINICAL DATA:  NG tube placement.  Postop EXAM: PORTABLE CHEST 1 VIEW COMPARISON:  07/09/2014 FINDINGS: NG tube is in the stomach. Low lung volumes with vascular congestion and bibasilar atelectasis. Possible small effusions. No acute bony abnormality. IMPRESSION: Low lung volumes.  Vascular congestion with bibasilar atelectasis. Suspect small effusions. NG tube in the stomach. Electronically Signed   By: Charlett NoseKevin  Dover M.D.   On: 07/31/2018 01:33    Anti-infectives: Anti-infectives (From admission, onward)   Start     Dose/Rate Route Frequency Ordered Stop   07/31/18 0600  clindamycin (CLEOCIN) IVPB 900 mg     900 mg 100 mL/hr over 30 Minutes Intravenous On call to O.R. 07/30/18 2208 07/30/18 2352   07/31/18 0600  gentamicin (GARAMYCIN) 310 mg in dextrose 5 % 100 mL IVPB     5 mg/kg  61.2 kg 107.8 mL/hr over 60 Minutes Intravenous On call to O.R. 07/30/18 2208 07/31/18 0006   07/31/18 0600  cefTRIAXone (ROCEPHIN) 2 g in sodium chloride 0.9 % 100 mL IVPB    Note to Pharmacy:  Pharmacy may adjust dosing strength / duration / interval for maximal efficacy   2 g 200 mL/hr over 30 Minutes Intravenous Daily 07/31/18 0258 08/05/18 0559   07/31/18 0400  metroNIDAZOLE (FLAGYL) IVPB 500 mg     500 mg 100 mL/hr over 60 Minutes Intravenous Every 6 hours 07/31/18 0258 08/01/18 0959   07/31/18 0003  clindamycin (CLEOCIN) 900 mg, gentamicin (GARAMYCIN) 240 mg in sodium chloride 0.9 % 1,000 mL for intraperitoneal lavage  Status:  Discontinued       As needed 07/31/18 0003 07/31/18 0052   07/30/18 2249   clindamycin (CLEOCIN) 900 MG/50ML IVPB    Note to Pharmacy:  Mirian Moarver, [place]   : cabinet override      07/30/18 2249 07/30/18 2322   07/30/18 2215  clindamycin (CLEOCIN) 900 mg, gentamicin (GARAMYCIN) 240 mg in sodium chloride 0.9 % 1,000 mL for intraperitoneal lavage    Note to Pharmacy:  Have in the  OR room for final irrigation in bowel surgery case to minimize risk of abscess/infection Pharmacy may adjust dosing strength, schedule, rate of infusion, etc as needed to optimize therapy    Irrigation To Surgery 07/30/18 2208 07/31/18 2215   07/30/18 2130  ceFEPIme (MAXIPIME) 2 g in sodium chloride 0.9 % 100 mL IVPB     2 g 200 mL/hr over 30 Minutes Intravenous  Once 07/30/18 2129 07/30/18 2232   07/30/18 2130  metroNIDAZOLE (FLAGYL) IVPB 500 mg     500 mg 100 mL/hr over 60 Minutes Intravenous  Once 07/30/18 2129 07/30/18 2306      Assessment/Plan: POD 1 lap Graham patch of perforated gastric ulcer- Gross -npo, needs ng tube replaced today -likely prolonged ileus given significant bile peritonitis -per Dr Michaell CowingGross would do ugi to ensure no leakage once bowel function returns and ng about ready to come out - she will need to see gi postop for eventual endoscopy as well Hyponatremia- likely chronic, a little better today, will follow Hypomagnesiumia-replace today, recheck  in am AKI- better this am, will follow, making urine, continue foley another 24 hours [place]- will give with sip and clamp tube ID- rocephin/flagyl d1/5 per Dr Dianna Rossetti- leave today, will remove in am tomorrow VTE- lovenox, scds Dispo- will see later today, possibly to floor  Emelia Loron 07/31/2018

## 2018-07-31 NOTE — Telephone Encounter (Signed)
Copied from CRM [id number]. Topic: Quick Communication - See Telephone Encounter >> Jul 31, 2018  8:49 AM Angela Nevin wrote: CRM for notification. See Telephone encounter for: 07/31/18.  Patient's husband, Melvyn Neth, calling as FYI. He states that patient was taken to ED last night and underwent emergency surgery for perforated ulcer and is expected to stay in the hospital for 2-3 days.

## 2018-07-31 NOTE — Telephone Encounter (Signed)
Yes   Noted.  Glad they went on to get further evaluated.

## 2018-07-31 NOTE — Progress Notes (Signed)
Called and updated patient's husband, Natasha Frederick, on his wife's status.  Home #: [phone] Cell #: [phone]  Franne Forts, [facility] 07/31/2018, 12:22 PM Pager: [phone] Mon 7:00 am -11:30 AM Tues-Fri 7:00 am-4:30 pm Sat-Sun 7:00 am-11:30 am

## 2018-07-31 NOTE — Plan of Care (Signed)
  Problem: Clinical Measurements: Goal: Postoperative complications will be avoided or minimized Outcome: Progressing   Problem: Education: Goal: Knowledge of General Education information will improve Description Including pain rating scale, medication(s)/side effects and non-pharmacologic comfort measures Outcome: Progressing   Problem: Clinical Measurements: Goal: Will remain free from infection Outcome: Progressing Goal: Diagnostic test results will improve Outcome: Progressing Goal: Respiratory complications will improve Outcome: Progressing   Problem: Activity: Goal: Risk for activity intolerance will decrease Outcome: Progressing

## 2018-07-31 NOTE — Anesthesia Procedure Notes (Signed)
Procedure Name: Intubation Performed by: Gean Maidens, CRNA Pre-anesthesia Checklist: Patient identified, Emergency Drugs available, Suction available, Patient being monitored and Timeout performed Patient Re-evaluated:Patient Re-evaluated prior to induction Oxygen Delivery Method: Circle system utilized Preoxygenation: Pre-oxygenation with 100% oxygen Induction Type: IV induction and Rapid sequence Laryngoscope Size: Mac and 4 Grade View: Grade I Tube type: Oral Tube size: 7.0 mm Number of attempts: 1 Airway Equipment and Method: Stylet Placement Confirmation: ETT inserted through vocal cords under direct vision,  breath sounds checked- equal and bilateral and positive ETCO2 Secured at: 21 cm Tube secured with: Tape Dental Injury: Teeth and Oropharynx as per pre-operative assessment

## 2018-08-01 DIAGNOSIS — R410 Disorientation, unspecified: Secondary | ICD-10-CM

## 2018-08-01 LAB — BASIC METABOLIC PANEL
Anion gap: 10 (ref 5–15)
BUN: 29 mg/dL — ABNORMAL HIGH (ref 8–23)
CO2: 15 mmol/L — ABNORMAL LOW (ref 22–32)
Calcium: 7.2 mg/dL — ABNORMAL LOW (ref 8.9–10.3)
Chloride: 102 mmol/L (ref 98–111)
Creatinine, Ser: 1.5 mg/dL — ABNORMAL HIGH (ref 0.44–1.00)
GFR calc Af Amer: 42 mL/min — ABNORMAL LOW (ref >60)
GFR calc non Af Amer: 36 mL/min — ABNORMAL LOW (ref >60)
Glucose, Bld: 49 mg/dL — ABNORMAL LOW (ref 70–99)
Potassium: 5.3 mmol/L — ABNORMAL HIGH (ref 3.5–5.1)
Sodium: 127 mmol/L — ABNORMAL LOW (ref 135–145)

## 2018-08-01 LAB — MAGNESIUM: Magnesium: 3 mg/dL — ABNORMAL HIGH (ref 1.7–2.4)

## 2018-08-01 LAB — H PYLORI, IGM, IGG, IGA AB
H Pylori IgG: 0.26 Index Value (ref 0.00–0.79)
H. Pylogi, Iga Abs: <9 units (ref 0.0–8.9)
H. Pylogi, Igm Abs: <9 units (ref 0.0–8.9)

## 2018-08-01 LAB — GLUCOSE, CAPILLARY
Glucose-Capillary: 39 mg/dL — CL (ref 70–99)
Glucose-Capillary: 163 mg/dL — ABNORMAL HIGH (ref 70–99)

## 2018-08-01 MED ORDER — DEXTROSE 50 % IV SOLN
INTRAVENOUS | Status: AC
  Filled 2018-08-01: qty 50

## 2018-08-01 MED ORDER — DEXTROSE 50 % IV SOLN
25.0000 g | INTRAVENOUS | Status: AC
  Administered 2018-08-01: 25 g via INTRAVENOUS
  Administered 2018-08-02: 07:00:00 12.5 g via INTRAVENOUS

## 2018-08-01 NOTE — Evaluation (Signed)
Occupational Therapy Evaluation Patient Details Name: Natasha Frederick MRN: [id number] DOB: 08/15/1955 Today's Date: 08/01/2018    History of Present Illness Pt is a 63 year old female s/p lap [name] patch of perforated gastric ulcer on 07/30/18 by Dr. Michaell CowingGross and has PMHx significant for Huntington's disease, scoliosis, back surgery   Clinical Impression   Pt was admitted for the above. Pt unable to provide history; called husband for PLOF/DME.  Pt was able to dress herself, feed herself; he assisted with walking to bathroom.  Based on clinical judgement, she needs total A +2-3 safety at this time as she has pulled lines out.  Will follow in acute setting with the goals listed below.     Follow Up Recommendations  SNF;Supervision/Assistance - 24 hour(depending upon progress)    Equipment Recommendations  None recommended by OT    Recommendations for Other Services       Precautions / Restrictions Precautions Precautions: Fall Precaution Comments: NG tube, multiple lines, restraints, mitts, 2 JP drains Restrictions Weight Bearing Restrictions: No      Mobility Bed Mobility Overal bed mobility: Needs Assistance Bed Mobility: Rolling;Sidelying to Sit;Sit to Sidelying Rolling: +2 for safety/equipment;Max assist Sidelying to sit: +2 for safety/equipment;Max assist     Sit to sidelying: +2 for safety/equipment;Max assist General bed mobility comments: performed log roll technique due to abdominal surgery, assist required for upper and lower body, 3rd person to manage lines, involuntary movements with mobility; assist to remain in upright posture, pt reports feet cramping very uncomfortable so assisted back to supine  Transfers                      Balance Overall balance assessment: Needs assistance Sitting-balance support: No upper extremity supported;Feet supported Sitting balance-Leahy Scale: Zero Sitting balance - Comments: posterior lean with sitting, hand remained in  mittens due to lines/drains Postural control: Posterior lean                                 ADL either performed or assessed with clinical judgement   ADL Overall ADL's : Needs assistance/impaired Eating/Feeding: NPO                                     General ADL Comments: could not safely remove bil mitts at this time due to pulling lines out quickly yesterday. Will continue to assess.  Pt had zero sitting balance; tends to push back.  total A for adls at time of assessment based on clinical judgment     Vision         Perception     Praxis      Pertinent Vitals/Pain Pain Assessment: Faces Faces Pain Scale: Hurts even more Pain Location: feet cramping, abdominal pain with movement Pain Descriptors / Indicators: Sore Pain Intervention(s): Limited activity within patient's tolerance;Monitored during session;Repositioned     Hand Dominance Right   Extremity/Trunk Assessment Upper Extremity Assessment Upper Extremity Assessment: Difficult to assess due to impaired cognition;RUE deficits/detail;LUE deficits/detail(and fatiqued at end of session; ) RUE Deficits / Details: full ROM shoulder; mitt left on as pt has pulled tubes quickly.  ? uncontrolled movements at times vs flailing--has Huntington's LUE Deficits / Details: not fully assessed; pt moves bil UEs; mitts left on and restraint was retied at end of session   Lower Extremity Assessment Lower  Extremity Assessment: Generalized weakness;Difficult to assess due to impaired cognition(involuntary LE movement, ankles remain in PF at rest)       Communication Communication Communication: No difficulties   Cognition Arousal/Alertness: Awake/alert Behavior During Therapy: Restless Overall Cognitive Status: No family/caregiver present to determine baseline cognitive functioning                                 General Comments: attempting to follow simple commands however requires  significant assist, able to answer yes/no questions   General Comments       Exercises     Shoulder Instructions      Home Living Family/patient expects to be discharged to:: Private residence Living Arrangements: Spouse/significant other Available Help at Discharge: Family   Home Access: Stairs to enter(3 to garage; hand rail)                     Home Equipment: Environmental consultant - 2 wheels;Bedside commode         Prior Functioning/Environment          Comments: called husband. pt normally dresses herself; he does IADLs.  He assists with ambulating--mostly him holding onto her        OT Problem List: Decreased strength;Decreased activity tolerance;Impaired balance (sitting and/or standing);Pain;Decreased safety awareness      OT Treatment/Interventions: Self-care/ADL training;DME and/or AE instruction;Therapeutic activities;Cognitive remediation/compensation;Patient/family education;Balance training;Therapeutic exercise    OT Goals(Current goals can be found in the care plan section) Acute Rehab OT Goals Patient Stated Goal: none stated OT Goal Formulation: With family Time For Goal Achievement: 08/15/18 Potential to Achieve Goals: Fair ADL Goals Pt Will Perform Eating: with set-up;sitting;with supervision Pt Will Perform Grooming: with set-up;with supervision;sitting Pt Will Transfer to Toilet: with mod assist;with +2 assist;bedside commode;stand pivot transfer Pt Will Perform Toileting - Clothing Manipulation and hygiene: with max assist;sitting/lateral leans Additional ADL Goal #1: pt will complete UB adls with min A sitting Additional ADL Goal #2: pt will assist with bed mobility, with mod A via sidelying  OT Frequency: Min 2X/week   Barriers to D/C:            Co-evaluation   Reason for Co-Treatment: Complexity of the patient's impairments (multi-system involvement) PT goals addressed during session: Mobility/safety with mobility OT goals addressed  during session: ADL's and self-care      AM-PAC OT "6 Clicks" Daily Activity     Outcome Measure Help from another person eating meals?: Total Help from another person taking care of personal grooming?: Total Help from another person toileting, which includes using toliet, bedpan, or urinal?: Total Help from another person bathing (including washing, rinsing, drying)?: Total Help from another person to put on and taking off regular upper body clothing?: Total Help from another person to put on and taking off regular lower body clothing?: Total 6 Click Score: 6   End of Session Nurse Communication: (participation in therapy)  Activity Tolerance: Patient limited by fatigue Patient left: in bed;with call bell/phone within reach;with nursing/sitter in room;with restraints reapplied  OT Visit Diagnosis: Unsteadiness on feet (R26.81);Muscle weakness (generalized) (M62.81)                Time: 8003-4917 OT Time Calculation (min): 16 min Charges:  OT General Charges $OT Visit: 1 Visit OT Evaluation $OT Eval Moderate Complexity: 1 Mod  Marica Otter, OTR/L Acute Rehabilitation Services [phone] [facility] pager [phone] office 08/01/2018  SPENCER,MARYELLEN 08/01/2018, 1:00  PM

## 2018-08-01 NOTE — Progress Notes (Signed)
Assessment & Plan: POD#2 - lap Graham patch of perforated gastric ulcer - Dr. [name]  NPO, NG decompression  per Dr [name], needs UGI to ensure no leakage once bowel function returns and before NG removed  Follow up with GI postop for EGD in about 6 weeks Hyponatremia- likely chronic, Na 124 this AM, will follow AKI- monitor creatinine, continue foley another 24 hours Huntingtons - will give meds with sip and clamp NG tube ID- rocephin/flagyl d2/5 per Dr Dianna Rossetti- leave today VTE- lovenox, scds        Darnell Level, MD       [facility]       Office: [phone]   Chief Complaint: Perforated gastric ulcer  Subjective: Patient in bed, sedated, restrained.  Nursing at bedside.  Objective: Vital signs in last 24 hours: Temp:  [97.6 F (36.4 C)-98.6 F (37 C)] 98.1 F (36.7 C) (04/24 0800) Pulse Rate:  [82-121] 102 (04/24 0701) Resp:  [7-37] 16 (04/24 0701) BP: (82-160)/(41-144) 130/56 (04/24 0600) SpO2:  [91 %-100 %] 95 % (04/24 0701)    Intake/Output from previous day: 04/23 0701 - 04/24 0700 In: 2251.8 [I.V.:1256.5; IV Piggyback:995.3] Out: 1634 [Urine:1025; Emesis/NG output:125; Drains:484] Intake/Output this shift: No intake/output data recorded.  Physical Exam: HEENT - sclerae clear, mucous membranes moist Neck - soft Chest - clear bilaterally Cor - RRR Abdomen - soft, dressings removed; JP drains with sanguinous and sersanguinous output; wounds dry and intact   Lab Results:  Recent Labs    07/30/18 2001 07/31/18 0344  WBC 4.6 4.4  HGB 15.1* 13.3  HCT 46.5* 40.3  PLT 372 256   BMET Recent Labs    07/30/18 2001 07/31/18 0344  NA 123* 124*  K 4.4 4.6  CL 91* 95*  CO2 15* 17*  GLUCOSE 184* 112*  BUN 18 22  CREATININE 1.84* 1.53*  CALCIUM 8.2* 7.4*   PT/INR No results for input(s): LABPROT, INR in the last 72 hours. Comprehensive Metabolic Panel:    Component Value Date/Time   NA 124 (L) 07/31/2018 0344   NA 123  (L) 07/30/2018 2001   K 4.6 07/31/2018 0344   K 4.4 07/30/2018 2001   CL 95 (L) 07/31/2018 0344   CL 91 (L) 07/30/2018 2001   CO2 17 (L) 07/31/2018 0344   CO2 15 (L) 07/30/2018 2001   BUN 22 07/31/2018 0344   BUN 18 07/30/2018 2001   CREATININE 1.53 (H) 07/31/2018 0344   CREATININE 1.84 (H) 07/30/2018 2001   GLUCOSE 112 (H) 07/31/2018 0344   GLUCOSE 184 (H) 07/30/2018 2001   CALCIUM 7.4 (L) 07/31/2018 0344   CALCIUM 8.2 (L) 07/30/2018 2001   AST 44 (H) 07/30/2018 2001   AST 17 07/28/2018 1445   ALT 23 07/30/2018 2001   ALT 17 07/28/2018 1445   ALKPHOS 52 07/30/2018 2001   ALKPHOS 66 07/28/2018 1445   BILITOT 0.8 07/30/2018 2001   BILITOT 0.6 07/28/2018 1445   PROT 5.8 (L) 07/30/2018 2001   PROT 6.0 07/28/2018 1445   ALBUMIN 3.2 (L) 07/30/2018 2001   ALBUMIN 4.0 07/28/2018 1445    Studies/Results: X-ray Abdomen Ap  Result Date: 07/31/2018 CLINICAL DATA:  NG tube placement EXAM: ABDOMEN - 1 VIEW COMPARISON:  None. FINDINGS: NG tube tip is in the mid stomach. Nonobstructive bowel gas pattern. Surgical drain noted within the abdomen. IMPRESSION: NG tube tip in the mid stomach. Electronically Signed   By: Charlett Nose M.D.   On: 07/31/2018  01:35   Ct Abdomen Pelvis W Contrast  Result Date: 07/30/2018 CLINICAL DATA:  Generalized abdominal pain, left lower quadrant pain. EXAM: CT ABDOMEN AND PELVIS WITH CONTRAST TECHNIQUE: Multidetector CT imaging of the abdomen and pelvis was performed using the standard protocol following bolus administration of intravenous contrast. CONTRAST:  OMNIPAQUE IOHEXOL 300 MG/ML  SOLN COMPARISON:  None FINDINGS: Lower chest: Small bilateral pleural effusions. Bibasilar atelectasis. Heart is normal size. Hepatobiliary: No focal hepatic abnormality. Gallbladder unremarkable. Pancreas: No focal abnormality or ductal dilatation. Spleen: No focal abnormality.  Normal size. Adrenals/Urinary Tract: Wedge-shaped low-density and area of decreased enhancement in  the midpole of the right kidney could reflect renal infarct. No hydronephrosis. Adrenal glands and left kidney unremarkable. Urinary bladder grossly unremarkable. Stomach/Bowel: Gastric wall appears thickened in the distal stomach region. Small bowel is decompressed and grossly unremarkable. Left colonic diverticulosis. No active diverticulitis. Moderate stool burden in the colon. Vascular/Lymphatic: Aortic atherosclerosis. No enlarged abdominal or pelvic lymph nodes. Reproductive: No visible pelvic masses. Beam hardening artifact from the left hip replacement obscures lower pelvic structures. Other: Large volume ascites and large amount of pneumoperitoneum noted in the abdomen. Exact source is difficult to determine. The mid to distal gastric wall does appear thickened possibly related to gastritis on coronal image 47, there is gas collection noted which appears to be along the lesser curvature of the stomach, possibly within the wall, possibly representing large ulceration. Musculoskeletal: Postoperative fusion changes in the lumbar spine. Diffuse degenerative disc disease changes. No acute bony abnormality. Prior left hip replacement. IMPRESSION: Large volume pneumoperitoneum and ascites in the abdomen. Exact source is difficult to determine, but favor gastric. The mid to distal gastric wall appears thickened and there may be large ulceration along the lesser curvature. Left colonic diverticulosis.  No active diverticulitis. Aortic atherosclerosis. Wedge-shaped low-density area in the midpole of the right kidney may reflect renal infarct. Small bilateral pleural effusions, bibasilar atelectasis. These results were called by telephone at the time of interpretation on 07/30/2018 at 9:16 pm to [facility] , who verbally acknowledged these results. Electronically Signed   By: Charlett Nose M.D.   On: 07/30/2018 21:18   Dg Chest Port 1 View  Result Date: 07/31/2018 CLINICAL DATA:  NG tube placement.  Postop  EXAM: PORTABLE CHEST 1 VIEW COMPARISON:  07/09/2014 FINDINGS: NG tube is in the stomach. Low lung volumes with vascular congestion and bibasilar atelectasis. Possible small effusions. No acute bony abnormality. IMPRESSION: Low lung volumes.  Vascular congestion with bibasilar atelectasis. Suspect small effusions. NG tube in the stomach. Electronically Signed   By: Charlett Nose M.D.   On: 07/31/2018 01:33   Dg Abd Portable 1v  Result Date: 07/31/2018 CLINICAL DATA:  Nasogastric tube placement. EXAM: PORTABLE ABDOMEN - 1 VIEW COMPARISON:  Radiograph of same day. FINDINGS: The bowel gas pattern is normal. Distal tip of nasogastric tube remains in the distal esophagus just above the gastroesophageal junction. Advancement is recommended. Surgical drain is unchanged in position. No radio-opaque calculi or other significant radiographic abnormality are seen. IMPRESSION: Nasogastric tube tip is seen at or just above the gastroesophageal junction. It is not significantly changed in position compared to prior exam. Advancement is recommended. Electronically Signed   By: Lupita Raider M.D.   On: 07/31/2018 12:56   Dg Abd Portable 1v  Result Date: 07/31/2018 CLINICAL DATA:  NG tube placement EXAM: PORTABLE ABDOMEN - 1 VIEW COMPARISON:  None. FINDINGS: Nasogastric tube with the tip projecting over esophagogastric junction. Recommend  advancing the tube 14 cm. There is no bowel dilatation to suggest obstruction. There is no evidence of pneumoperitoneum, portal venous gas or pneumatosis. There are no pathologic calcifications along the expected course of the ureters. The osseous structures are unremarkable. IMPRESSION: Nasogastric tube with the tip projecting over esophagogastric junction. Recommend advancing the tube 14 cm. Electronically Signed   By: Elige KoHetal  Patel   On: 07/31/2018 11:00   Dg Abd Portable 1v  Result Date: 07/31/2018 CLINICAL DATA:  Nasogastric tube placement. EXAM: PORTABLE ABDOMEN - 1 VIEW  COMPARISON:  Radiograph of same day. FINDINGS: The bowel gas pattern is normal. No radio-opaque calculi or other significant radiographic abnormality are seen. Nasogastric tube is not visualized currently on this study. Postsurgical changes are noted in the lower lumbar spine and left hip. IMPRESSION: Nasogastric tube is not visualized on this study. No evidence of bowel obstruction or ileus. Electronically Signed   By: Lupita RaiderJames  Green Jr M.D.   On: 07/31/2018 09:35      Darnell Levelodd Gerkin 08/01/2018

## 2018-08-01 NOTE — Progress Notes (Signed)
Patient is having small burst of elevated heart rate that is non-sustained. At times, she is active in the bed and at other times, she is resting. Previous nurse voiced that patient was having these same episodes yesterday. No objective signs of discomfort noted. Monitoring continued.

## 2018-08-01 NOTE — Evaluation (Signed)
Physical Therapy Evaluation Patient Details Name: Natasha BeckmannJudith Frederick MRN: [id number] DOB: 11/27/1955 Today's Date: 08/01/2018   History of Present Illness  Pt is a 63 year old female s/p lap [name] patch of perforated gastric ulcer on 07/30/18 by Dr. Michaell CowingGross and has PMHx significant for Huntington's disease, scoliosis, back surgery  Clinical Impression  Pt admitted with above diagnosis. Pt currently with functional limitations due to the deficits listed below (see PT Problem List).  Pt will benefit from skilled PT to increase their independence and safety with mobility to allow discharge to the venue listed below.  Pt assisted to sitting EOB however did not tolerate sitting more then 3 minutes.  Pt remains with sitter, in restraints and mitts with multiple lines/drains.  Pt may need SNF upon d/c.  Uncertain of baseline as pt poor historian.     Follow Up Recommendations SNF;Supervision/Assistance - 24 hour    Equipment Recommendations  Wheelchair cushion (measurements PT);Wheelchair (measurements PT)(if she does not already have w/c)    Recommendations for Other Services       Precautions / Restrictions Precautions Precautions: Fall Precaution Comments: NG tube, multiple lines, restraints, mitts, 2 JP drains  Vitals end of session: 100/53 mmHg, 92% room air, 95-122 bpm     Mobility  Bed Mobility Overal bed mobility: Needs Assistance Bed Mobility: Rolling;Sidelying to Sit;Sit to Sidelying Rolling: +2 for safety/equipment;Max assist Sidelying to sit: +2 for safety/equipment;Max assist     Sit to sidelying: +2 for safety/equipment;Max assist General bed mobility comments: performed log roll technique due to abdominal surgery, assist required for upper and lower body, 3rd person to manage lines, involuntary movements with mobility; assist to remain in upright posture, pt reports feet cramping very uncomfortable so assisted back to supine  Transfers                     Ambulation/Gait                Stairs            Wheelchair Mobility    Modified Rankin (Stroke Patients Only)       Balance Overall balance assessment: Needs assistance Sitting-balance support: No upper extremity supported;Feet supported Sitting balance-Leahy Scale: Zero Sitting balance - Comments: posterior lean with sitting, hand remained in mittens due to lines/drains Postural control: Posterior lean                                   Pertinent Vitals/Pain Pain Assessment: Faces Faces Pain Scale: Hurts even more Pain Location: feet cramping, abdominal pain with movement Pain Descriptors / Indicators: (abdomen tender, sore/ feet spasm and cramping) Pain Intervention(s): Monitored during session;Repositioned(RN notified)    Home Living Family/patient expects to be discharged to:: Private residence Living Arrangements: Spouse/significant other               Additional Comments: nursing reports pt lives with spouse, pt poor historian    Prior Function           Comments: likely transfers or limited distance based on pt presentation (increased bil feet plantar flexion, hx Huntingtons)     Hand Dominance        Extremity/Trunk Assessment        Lower Extremity Assessment Lower Extremity Assessment: Generalized weakness;Difficult to assess due to impaired cognition(involuntary LE movement, ankles remain in PF at rest)       Communication   Communication:  No difficulties  Cognition Arousal/Alertness: Awake/alert Behavior During Therapy: Restless Overall Cognitive Status: No family/caregiver present to determine baseline cognitive functioning                                 General Comments: attempting to follow simple commands however requires significant assist, able to answer yes/no questions      General Comments      Exercises     Assessment/Plan    PT Assessment Patient needs continued PT  services  PT Problem List Decreased strength;Decreased mobility;Decreased balance;Decreased knowledge of use of DME;Decreased activity tolerance;Decreased coordination       PT Treatment Interventions DME instruction;Functional mobility training;Balance training;Patient/family education;Gait training;Therapeutic activities;Neuromuscular re-education;Stair training;Therapeutic exercise    PT Goals (Current goals can be found in the Care Plan section)  Acute Rehab PT Goals PT Goal Formulation: Patient unable to participate in goal setting Time For Goal Achievement: 08/15/18 Potential to Achieve Goals: Good    Frequency Min 3X/week   Barriers to discharge        Co-evaluation PT/OT/SLP Co-Evaluation/Treatment: Yes Reason for Co-Treatment: Complexity of the patient's impairments (multi-system involvement);For patient/therapist safety;Necessary to address cognition/behavior during functional activity PT goals addressed during session: Mobility/safety with mobility OT goals addressed during session: ADL's and self-care       AM-PAC PT "6 Clicks" Mobility  Outcome Measure Help needed turning from your back to your side while in a flat bed without using bedrails?: Total Help needed moving from lying on your back to sitting on the side of a flat bed without using bedrails?: Total Help needed moving to and from a bed to a chair (including a wheelchair)?: Total Help needed standing up from a chair using your arms (e.g., wheelchair or bedside chair)?: Total Help needed to walk in hospital room?: Total Help needed climbing 3-5 steps with a railing? : Total 6 Click Score: 6    End of Session   Activity Tolerance: Patient limited by fatigue Patient left: in bed;with call bell/phone within reach;with nursing/sitter in room(sitter present, UE restraints and mitts in place) Nurse Communication: Mobility status PT Visit Diagnosis: Muscle weakness (generalized) (M62.81);Other abnormalities of  gait and mobility (R26.89)    Time: 1105-1120 PT Time Calculation (min) (ACUTE ONLY): 15 min   Charges:   PT Evaluation $PT Eval Moderate Complexity: 1 Mod          Zenovia Jarred, PT, DPT Acute Rehabilitation Services Office: [phone] Pager: [phone]  Sarajane Jews 08/01/2018, 12:35 PM

## 2018-08-01 NOTE — Progress Notes (Signed)
Hypoglycemic Event  CBG:   Treatment: D50 50 mL (25 gm) Symptoms: Vision changes and None Follow-up CBG: Time:1155 CBG Result:163 Possible Reasons for Event: Unknown  Comments/MD notified Dr Gerrit Friends Paged    Susann Givens, Danna Ione

## 2018-08-01 NOTE — Progress Notes (Signed)
CRITICAL VALUE ALERT  Critical Value:  Glucose 49    Date & Time Notied:  08/01/2018 1132am.   Provider Notified: Dr. Darnell Level  Orders Received/Actions taken:  Standing order for 25g D50

## 2018-08-02 ENCOUNTER — Inpatient Hospital Stay (HOSPITAL_COMMUNITY): Payer: Medicare Other

## 2018-08-02 LAB — BASIC METABOLIC PANEL
Anion gap: 8 (ref 5–15)
BUN: 21 mg/dL (ref 8–23)
CO2: 17 mmol/L — ABNORMAL LOW (ref 22–32)
Calcium: 7.6 mg/dL — ABNORMAL LOW (ref 8.9–10.3)
Chloride: 108 mmol/L (ref 98–111)
Creatinine, Ser: 0.82 mg/dL (ref 0.44–1.00)
GFR calc Af Amer: >60 mL/min (ref >60)
GFR calc non Af Amer: >60 mL/min (ref >60)
Glucose, Bld: 55 mg/dL — ABNORMAL LOW (ref 70–99)
Potassium: 4.2 mmol/L (ref 3.5–5.1)
Sodium: 133 mmol/L — ABNORMAL LOW (ref 135–145)

## 2018-08-02 LAB — CBC
HCT: 32.3 % — ABNORMAL LOW (ref 36.0–46.0)
HCT: 32.9 % — ABNORMAL LOW (ref 36.0–46.0)
Hemoglobin: 10.5 g/dL — ABNORMAL LOW (ref 12.0–15.0)
Hemoglobin: 10.5 g/dL — ABNORMAL LOW (ref 12.0–15.0)
MCH: 28.8 pg (ref 26.0–34.0)
MCH: 28.7 pg (ref 26.0–34.0)
MCHC: 32.5 g/dL (ref 30.0–36.0)
MCHC: 31.9 g/dL (ref 30.0–36.0)
MCV: 88.7 fL (ref 80.0–100.0)
MCV: 89.9 fL (ref 80.0–100.0)
Platelets: 241 10*3/uL (ref 150–400)
Platelets: 247 10*3/uL (ref 150–400)
RBC: 3.64 MIL/uL — ABNORMAL LOW (ref 3.87–5.11)
RBC: 3.66 MIL/uL — ABNORMAL LOW (ref 3.87–5.11)
RDW: 13.1 % (ref 11.5–15.5)
RDW: 13.3 % (ref 11.5–15.5)
WBC: 11 10*3/uL — ABNORMAL HIGH (ref 4.0–10.5)
WBC: 11 10*3/uL — ABNORMAL HIGH (ref 4.0–10.5)
nRBC: 0 % (ref 0.0–0.2)
nRBC: 0 % (ref 0.0–0.2)

## 2018-08-02 LAB — GLUCOSE, CAPILLARY
Glucose-Capillary: 111 mg/dL — ABNORMAL HIGH (ref 70–99)
Glucose-Capillary: 124 mg/dL — ABNORMAL HIGH (ref 70–99)
Glucose-Capillary: 66 mg/dL — ABNORMAL LOW (ref 70–99)
Glucose-Capillary: 100 mg/dL — ABNORMAL HIGH (ref 70–99)
Glucose-Capillary: 95 mg/dL (ref 70–99)

## 2018-08-02 MED ORDER — DIATRIZOATE MEGLUMINE & SODIUM 66-10 % PO SOLN
ORAL | Status: AC
  Filled 2018-08-02: qty 30

## 2018-08-02 MED ORDER — KCL IN DEXTROSE-NACL 20-5-0.9 MEQ/L-%-% IV SOLN
INTRAVENOUS | Status: DC
  Administered 2018-08-02 – 2018-08-04 (×3): via INTRAVENOUS
  Filled 2018-08-02 (×4): qty 1000

## 2018-08-02 MED ORDER — DEXTROSE 50 % IV SOLN: 12.5000 g | INTRAVENOUS | Status: AC

## 2018-08-02 MED ORDER — DEXTROSE 50 % IV SOLN
INTRAVENOUS | Status: AC
  Administered 2018-08-02: 12.5 g via INTRAVENOUS
  Filled 2018-08-02: qty 50

## 2018-08-02 MED ORDER — DEXTROSE 50 % IV SOLN
12.5000 g | INTRAVENOUS | Status: AC
  Administered 2018-08-02: 12.5 g via INTRAVENOUS

## 2018-08-02 MED ORDER — DEXTROSE 50 % IV SOLN
INTRAVENOUS | Status: AC
  Filled 2018-08-02: qty 50

## 2018-08-02 NOTE — Progress Notes (Signed)
Pt hypoglycemic this am to 55, hypoglycemic protocol followed. Pt asymptomatic. Will continue to monitor. On coming nurse notified

## 2018-08-02 NOTE — Progress Notes (Signed)
Report called to husband questions ask and answered. Sharrell Ku RN

## 2018-08-02 NOTE — Progress Notes (Signed)
Patient ID: Natasha Frederick, female   DOB: 02/16/1956, 63 y.o.   MRN: [id number]  UGI prelim results:  Focal outpouching of contrast consistent with a contained leak at lesser curvature of stomach. This may the expected appearance after an omental patch over the gastric defect. There is no gross spilling of contrast into the peritoneal space.

## 2018-08-02 NOTE — Progress Notes (Signed)
Updated husband.   Natasha Frederick. Andrey Campanile, MD, FACS General, Bariatric, & Minimally Invasive Surgery [facility], [place]

## 2018-08-02 NOTE — Progress Notes (Signed)
Washed pt's face

## 2018-08-02 NOTE — Progress Notes (Signed)
Pt on the phone with husband.

## 2018-08-02 NOTE — Progress Notes (Signed)
Pt put back into bed by two nurses

## 2018-08-02 NOTE — Progress Notes (Signed)
Patient to CT on tele and with RN. Repeat CBG done and CBG 124> D [employer]

## 2018-08-02 NOTE — Progress Notes (Signed)
CRITICAL VALUE ALERT  Critical Value:  cbg 79  Date & Time Notied:  04/25 1215  Provider Notified:   Orders Received/Actions taken:hypoglycemic protocol

## 2018-08-02 NOTE — Progress Notes (Signed)
Patient unable to void after foley removed this AM. In and Out cath done with 600cc concentrated urine returned.  Sharrell Ku RN

## 2018-08-02 NOTE — Progress Notes (Signed)
Pt requested mouth swab for dry mouth. Nurse completed for pt.

## 2018-08-02 NOTE — Progress Notes (Signed)
Pt requested tv channel to be changed

## 2018-08-02 NOTE — Progress Notes (Signed)
Assessment & Plan: POD#3- lap Graham patch of perforated gastric ulcer - Dr. Michaell CowingGross  NPO, NG decompression  Minimal NG output, will get UGI today to evaluate for leak; if no Leak will dc NG and start clears  Follow up with GI postop for EGD in about 6 weeks Hyponatremia- resolved AKI-resolved.  Dc foley Huntingtons - will give meds with sip and clamp NG tube ID- rocephin/flagyl d3/5 per Dr Michaell CowingGross Foley- dc today Hypoglycemia - has some periods of low BS, will change IVF to D5NS VTE- lovenox, scds Renew restraints - still pulling at tubes at times OOB PT/OT        Mary SellaEric M. Andrey CampanileWilson, MD, FACS General, Bariatric, & Minimally Invasive Surgery [facility], [place]    Chief Complaint: Perforated gastric ulcer  Subjective: Patient in bed, alert, restrained.  Nursing at bedside. Nurse reports pt more oriented. Pt denies abd pain  Objective: Vital signs in last 24 hours: Temp:  [98 F (36.7 C)-98.6 F (37 C)] 98.2 F (36.8 C) (04/25 0200) Pulse Rate:  [74-119] 114 (04/25 0800) Resp:  [12-24] 18 (04/25 0800) BP: (93-151)/(50-111) 151/77 (04/25 0800) SpO2:  [82 %-100 %] 82 % (04/25 0800) Last BM Date: (pta)  Intake/Output from previous day: 04/24 0701 - 04/25 0700 In: 1222.7 [P.O.:3; I.V.:1112.7; IV Piggyback:107] Out: 2720 [Urine:2325; Emesis/NG output:75; Drains:320] Intake/Output this shift: Total I/O In: -  Out: 40 [Drains:40]  Physical Exam: HEENT - sclerae clear, mucous membranes moist Neck - soft Chest - clear bilaterally Cor - RRR Abdomen - soft, dressings removed; JP drains with sanguinous and sersanguinous output; wounds dry and intact   Lab Results:  Recent Labs    08/01/18 1000 08/02/18 0500  WBC 11.0* 11.0*  HGB 10.5* 10.5*  HCT 32.3* 32.9*  PLT 241 247   BMET Recent Labs    08/01/18 1000 08/02/18 0500  NA 127* 133*  K 5.3* 4.2  CL 102 108  CO2 15* 17*  GLUCOSE 49* 55*  BUN 29* 21  CREATININE 1.50* 0.82  CALCIUM 7.2* 7.6*   PT/INR No results for input(s): LABPROT, INR in the last 72 hours. Comprehensive Metabolic Panel:    Component Value Date/Time   NA 133 (L) 08/02/2018 0500   NA 127 (L) 08/01/2018 1000   K 4.2 08/02/2018 0500   K 5.3 (H) 08/01/2018 1000   CL 108 08/02/2018 0500   CL 102 08/01/2018 1000   CO2 17 (L) 08/02/2018 0500   CO2 15 (L) 08/01/2018 1000   BUN 21 08/02/2018 0500   BUN 29 (H) 08/01/2018 1000   CREATININE 0.82 08/02/2018 0500   CREATININE 1.50 (H) 08/01/2018 1000   GLUCOSE 55 (L) 08/02/2018 0500   GLUCOSE 49 (L) 08/01/2018 1000   CALCIUM 7.6 (L) 08/02/2018 0500   CALCIUM 7.2 (L) 08/01/2018 1000   AST 44 (H) 07/30/2018 2001   AST 17 07/28/2018 1445   ALT 23 07/30/2018 2001   ALT 17 07/28/2018 1445   ALKPHOS 52 07/30/2018 2001   ALKPHOS 66 07/28/2018 1445   BILITOT 0.8 07/30/2018 2001   BILITOT 0.6 07/28/2018 1445   PROT 5.8 (L) 07/30/2018 2001   PROT 6.0 07/28/2018 1445   ALBUMIN 3.2 (L) 07/30/2018 2001   ALBUMIN 4.0 07/28/2018 1445    Studies/Results: Dg Abd Portable 1v  Result Date: 07/31/2018 CLINICAL DATA:  Nasogastric tube placement. EXAM: PORTABLE ABDOMEN - 1 VIEW COMPARISON:  Radiograph of same day. FINDINGS: The bowel gas pattern is normal. Distal tip of nasogastric  tube remains in the distal esophagus just above the gastroesophageal junction. Advancement is recommended. Surgical drain is unchanged in position. No radio-opaque calculi or other significant radiographic abnormality are seen. IMPRESSION: Nasogastric tube tip is seen at or just above the gastroesophageal junction. It is not significantly changed in position compared to prior exam. Advancement is recommended. Electronically Signed   By: Lupita Raider M.D.   On: 07/31/2018 12:56   Dg Abd Portable 1v  Result Date: 07/31/2018 CLINICAL DATA:  NG tube placement EXAM: PORTABLE ABDOMEN - 1 VIEW COMPARISON:  None. FINDINGS: Nasogastric tube with the tip projecting over esophagogastric junction. Recommend  advancing the tube 14 cm. There is no bowel dilatation to suggest obstruction. There is no evidence of pneumoperitoneum, portal venous gas or pneumatosis. There are no pathologic calcifications along the expected course of the ureters. The osseous structures are unremarkable. IMPRESSION: Nasogastric tube with the tip projecting over esophagogastric junction. Recommend advancing the tube 14 cm. Electronically Signed   By: Elige Ko   On: 07/31/2018 11:00   Dg Abd Portable 1v  Result Date: 07/31/2018 CLINICAL DATA:  Nasogastric tube placement. EXAM: PORTABLE ABDOMEN - 1 VIEW COMPARISON:  Radiograph of same day. FINDINGS: The bowel gas pattern is normal. No radio-opaque calculi or other significant radiographic abnormality are seen. Nasogastric tube is not visualized currently on this study. Postsurgical changes are noted in the lower lumbar spine and left hip. IMPRESSION: Nasogastric tube is not visualized on this study. No evidence of bowel obstruction or ileus. Electronically Signed   By: Lupita Raider M.D.   On: 07/31/2018 09:35      Gaynelle Adu 08/02/2018

## 2018-08-02 NOTE — Progress Notes (Signed)
Pt on the phone with her husband

## 2018-08-02 NOTE — Progress Notes (Signed)
Pt pulled up in bed   

## 2018-08-02 NOTE — Progress Notes (Signed)
Pt resting with eyes closed.

## 2018-08-02 NOTE — Progress Notes (Signed)
Pt assisted to chair by two nurses

## 2018-08-03 LAB — GLUCOSE, CAPILLARY
Glucose-Capillary: 105 mg/dL — ABNORMAL HIGH (ref 70–99)
Glucose-Capillary: 108 mg/dL — ABNORMAL HIGH (ref 70–99)
Glucose-Capillary: 113 mg/dL — ABNORMAL HIGH (ref 70–99)
Glucose-Capillary: 129 mg/dL — ABNORMAL HIGH (ref 70–99)
Glucose-Capillary: 94 mg/dL (ref 70–99)
Glucose-Capillary: 99 mg/dL (ref 70–99)

## 2018-08-03 NOTE — Progress Notes (Addendum)
Assessment & Plan: POD#4- lap Graham patch of perforated gastric ulcer - Dr. [name]  Minimal NG output  UGI 4/25 showed contained leak  Will clamp NG today and start clears and monitor vitals and  drain output  Follow up with GI postop for EGD in about 6 weeks Hyponatremia- resolved AKI-resolved.  Foley removed 4/25 Huntingtons - cont home meds; will discuss pt's prehospital functional status with husband this am -patient's husband states patient was walking with somewhat of assistance prior to hospitalization.  She had a walker and cane but did not use it.  She would use him for balance.  He states that her feet were in normal position as best he could remember; he states that her neurologist is at [facility].  Advised him that we will call a neurology consult tomorrow since it is a nonurgent consult.  My guess is this is probably just progression of her Huntington's ID- rocephin/flagyl d4/5 per Dr [name] Foley- dc today Hypoglycemia - resolved with D5NS VTE- lovenox, scds dc restraints  Still need sitter and mittens today OOB - is max assist per nurse PT/OT Updated husband per phone       Mary Sella. Andrey Campanile, MD, FACS General, Bariatric, & Minimally Invasive Surgery [facility], [place]    Chief Complaint: Perforated gastric ulcer  Subjective: Patient in bed, alert, in mittens   . Pt denies abd pain Pt states she came to hospital b/c of b/l feet issues - couldn't walk anymore, leg spasms  Objective: Vital signs in last 24 hours: Temp:  [98 F (36.7 C)-99.2 F (37.3 C)] 98.8 F (37.1 C) (04/26 0030) Pulse Rate:  [73-121] 80 (04/26 0600) Resp:  [13-28] 18 (04/26 0600) BP: (111-153)/(57-105) 130/63 (04/26 0500) SpO2:  [90 %-96 %] 91 % (04/26 0600) Last BM Date: 08/02/18  Intake/Output from previous day: 04/25 0701 - 04/26 0700 In: 2003 [I.V.:1759.4; IV Piggyback:243.6] Out: 1809 [Urine:1600; Drains:209] Intake/Output this shift: Total I/O In: 52.3  [I.V.:45.1; IV Piggyback:7.2] Out: 1000 [Urine:1000]  Physical Exam: HEENT - sclerae clear, mucous membranes moist Neck - soft Chest - clear bilaterally Cor - RRR Abdomen - soft, dressings removed; JP drains with sanguinous and sersanguinous output; wounds dry and intact Neuro - alert, ox4; both feet have been plantar flexed both days   Lab Results:  Recent Labs    08/01/18 1000 08/02/18 0500  WBC 11.0* 11.0*  HGB 10.5* 10.5*  HCT 32.3* 32.9*  PLT 241 247   BMET Recent Labs    08/01/18 1000 08/02/18 0500  NA 127* 133*  K 5.3* 4.2  CL 102 108  CO2 15* 17*  GLUCOSE 49* 55*  BUN 29* 21  CREATININE 1.50* 0.82  CALCIUM 7.2* 7.6*   PT/INR No results for input(s): LABPROT, INR in the last 72 hours. Comprehensive Metabolic Panel:    Component Value Date/Time   NA 133 (L) 08/02/2018 0500   NA 127 (L) 08/01/2018 1000   K 4.2 08/02/2018 0500   K 5.3 (H) 08/01/2018 1000   CL 108 08/02/2018 0500   CL 102 08/01/2018 1000   CO2 17 (L) 08/02/2018 0500   CO2 15 (L) 08/01/2018 1000   BUN 21 08/02/2018 0500   BUN 29 (H) 08/01/2018 1000   CREATININE 0.82 08/02/2018 0500   CREATININE 1.50 (H) 08/01/2018 1000   GLUCOSE 55 (L) 08/02/2018 0500   GLUCOSE 49 (L) 08/01/2018 1000   CALCIUM 7.6 (L) 08/02/2018 0500   CALCIUM 7.2 (L) 08/01/2018 1000  AST 44 (H) 07/30/2018 2001   AST 17 07/28/2018 1445   ALT 23 07/30/2018 2001   ALT 17 07/28/2018 1445   ALKPHOS 52 07/30/2018 2001   ALKPHOS 66 07/28/2018 1445   BILITOT 0.8 07/30/2018 2001   BILITOT 0.6 07/28/2018 1445   PROT 5.8 (L) 07/30/2018 2001   PROT 6.0 07/28/2018 1445   ALBUMIN 3.2 (L) 07/30/2018 2001   ALBUMIN 4.0 07/28/2018 1445    Studies/Results: Dg Ugi W Single Cm (sol Or Thin Ba)  Result Date: 08/02/2018 CLINICAL DATA:  Status post omental patch for perforated gastric ulcer. EXAM: WATER SOLUBLE UPPER GI SERIES TECHNIQUE: Single-column upper GI series was performed using water soluble contrast. CONTRAST:   Omnipaque 180 COMPARISON:  None. FLUOROSCOPY TIME:  Fluoroscopy Time:  2 minutes and 48 seconds. Radiation Exposure Index (if provided by the fluoroscopic device): 31.3 mGy. Number of Acquired Spot Images: 0 FINDINGS: Initially, the NG tube was noted in the distal esophagus. It was advanced into the fundus of the stomach. Contrast injected fills the stomach as well as the duodenum. There is a focal outpouching of contrast at the lesser curvature of the antrum of the stomach. It measures 1.1 cm in maximal diameter. There is a wide neck allowing passage of contrast into this outpouching. There is no gross spillage of contrast into the peritoneal space. The stomach is otherwise within normal limits without intraluminal polypoid filling defect or stricture. The duodenum is also unremarkable. IMPRESSION: The study demonstrates a focal outpouching of contrast at the lesser curvature of the antrum, at the presumed location of the omental patch. There is no gross spillage of contrast into the peritoneal space. This may be the expected appearance of an omental patch over a gastric wall defect however a recurrent perforation is not excluded. Findings were discussed with Dr. Gaynelle AduEric Wilson. Electronically Signed   By: Jolaine ClickArthur  Hoss M.D.   On: 08/02/2018 13:00      Gaynelle Aduric Wilson 08/03/2018

## 2018-08-04 DIAGNOSIS — G10 Huntington's disease: Secondary | ICD-10-CM

## 2018-08-04 LAB — CBC
HCT: 35.2 % — ABNORMAL LOW (ref 36.0–46.0)
Hemoglobin: 10.9 g/dL — ABNORMAL LOW (ref 12.0–15.0)
MCH: 28.2 pg (ref 26.0–34.0)
MCHC: 31 g/dL (ref 30.0–36.0)
MCV: 91 fL (ref 80.0–100.0)
Platelets: 264 10*3/uL (ref 150–400)
RBC: 3.87 MIL/uL (ref 3.87–5.11)
RDW: 13.8 % (ref 11.5–15.5)
WBC: 12.1 10*3/uL — ABNORMAL HIGH (ref 4.0–10.5)
nRBC: 1.2 % — ABNORMAL HIGH (ref 0.0–0.2)

## 2018-08-04 LAB — BASIC METABOLIC PANEL
Anion gap: 7 (ref 5–15)
BUN: 10 mg/dL (ref 8–23)
CO2: 19 mmol/L — ABNORMAL LOW (ref 22–32)
Calcium: 7.8 mg/dL — ABNORMAL LOW (ref 8.9–10.3)
Chloride: 108 mmol/L (ref 98–111)
Creatinine, Ser: 0.43 mg/dL — ABNORMAL LOW (ref 0.44–1.00)
GFR calc Af Amer: >60 mL/min (ref >60)
GFR calc non Af Amer: >60 mL/min (ref >60)
Glucose, Bld: 103 mg/dL — ABNORMAL HIGH (ref 70–99)
Potassium: 3.8 mmol/L (ref 3.5–5.1)
Sodium: 134 mmol/L — ABNORMAL LOW (ref 135–145)

## 2018-08-04 LAB — AMYLASE, PLEURAL OR PERITONEAL FLUID: Amylase, Fluid: 109 U/L

## 2018-08-04 LAB — GLUCOSE, CAPILLARY: Glucose-Capillary: 91 mg/dL (ref 70–99)

## 2018-08-04 LAB — AMYLASE: Amylase: 195 U/L — ABNORMAL HIGH (ref 28–100)

## 2018-08-04 MED ORDER — HYDROMORPHONE HCL 1 MG/ML IJ SOLN: 0.5000 mg | INTRAMUSCULAR | Status: DC | PRN

## 2018-08-04 MED ORDER — LORAZEPAM 2 MG/ML IJ SOLN
1.0000 mg | Freq: Four times a day (QID) | INTRAMUSCULAR | Status: DC | PRN
  Administered 2018-08-04: 2 mg via INTRAVENOUS
  Administered 2018-08-07 – 2018-08-10 (×2): 1 mg via INTRAVENOUS
  Filled 2018-08-04 (×3): qty 1

## 2018-08-04 NOTE — Progress Notes (Addendum)
Natasha Frederick [id number] Mar 30, 1956  CARE TEAM:  PCP: Kristian Covey, MD  Outpatient Care Team: Patient Care Team: Kristian Covey, MD as PCP - General (Family Medicine) Aliene Beams, MD as Referring Physician (Neurosurgery) Tat, Octaviano Batty, DO as Consulting Physician (Neurology) Karie Soda, MD as Consulting Physician (General Surgery) Wynema Birch, MD as Consulting Physician (Neurology)  Inpatient Treatment Team: Treatment Team: Attending Provider: Bishop Limbo, MD; Consulting Physician: Bishop Limbo, MD; Registered Nurse: Sherren Kerns, RN; Registered Nurse: Koren Shiver, RN; Registered Nurse: Lenord Fellers, RN; Occupational Therapist: Apickup-Ot, A, OT; Registered Nurse: Juliann Mule, RN; Consulting Physician: Aroor, Dara Lords, MD; Physical Therapist: Lynnell Catalan, PT   Problem List:   Principal Problem:   Perforated gastric ulcer s/p omental patch 07/30/2018 Active Problems:   Huntington disease (HCC)   Pneumoperitoneum   Scoliosis of lumbar spine   Primary osteoarthritis of left hip   Long term current use of non-steroidal anti-inflammatories (NSAID)   ARF (acute renal failure) (HCC)   Bile peritonitis (HCC)   Delirium   5 Days Post-Op  07/30/2018  POST-OPERATIVE DIAGNOSIS:   Perforated gastric ulcer Biliary Peritonitis with Intra-abdominal abcesses.    PROCEDURE:  OMENTAL (GRAHAM) PATCH LAPAROSCOPY DIAGNOSTIC WASHOUT/DRAINAGE OF INTRAABDOMINAL ASBCESSES  SURGEON:  Ardeth Sportsman, MD  Assessment  Guarded but stabilizing  [facility] Stay = 5 days)  Plan:  S/p lap Graham patch of perforated gastric ulcer - Dr. [name]             IV PPI.  Switch to p.o. when safe              UGI 4/25 showed contained leak.  We will try low volume of clear liquids.  Follow drain output.  D/c RLQ drain (goes into pelvis & retrosplenic, not over the ulcer)    Send LUQ drain (overlies the repair) fluid for amylase level - if low, can ADAT.  If  high, sips only & re-evaluate later in the week.  TNA if NPO > 7days.  Hpylori begative by serology.    Follow up with GI postop for EGD in about 6 weeks                    Hyponatremia- resolved  AKI-resolved.  Foley removed 4/25  Huntingtons Disease -  continue home Deutetrabenazine PO as tolerated (challenge w perf ulcer surgery & expected ileus).  Preliminarily neurology did not think there is a great IV alternative.  We were able to reach the PA from the neurology service and requested a formal consultation.  It is not necessarily stat but given the patient's more deconditioned state and questionable ability to absorb her home medication, their help would be appreciated.  Was told they would stop by this afternoon for more formal evaluation  Neurologist is at [facility], Dr [name].  d/w PA for Neurology to help evaluate progression of her Huntington's.    ID- Rocephin/flagyl x5 days.  Completed.  CT scan to rule out abscess if worsening leukocytosis/ileus  Foley- dc 4/25  Hypoglycemia - resolved with D5NS  VTE- lovenox, scds  Delirium-seems to be finally improving.  IV lorazepam PRN ordered per Neurology recs.  OK to go to floor per nursing but they agree with sitter at least through today  Fair mobility.  PT help follow.  OOB - is max assist per nurse.  Patient's husband states patient was walking with somewhat of assistance prior to hospitalization. She had  a walker and cane but did not use it.  She would use it for balance.  He states that her feet were in normal position as best he could remember but feet have been in plantar flexion since in the ED.      45 minutes spent in review, evaluation, examination, counseling, and coordination of care.  More than 50% of that time was spent in counseling.  08/04/2018    Subjective: (Chief complaint)  Pulled out NGT again  Sitter & SDU RN in room Patient still confused at times but better - less agitated Low  glc - Tx'd Claims she is more concerned about her feet and not able to walk well.    Objective:  Vital signs:  Vitals:   08/04/18 0500 08/04/18 0600 08/04/18 0700 08/04/18 0739  BP: 133/79 (!) 142/87 (!) 145/75   Pulse: 75 83 77   Resp: 19 (!) 21 18   Temp:    99.1 F (37.3 C)  TempSrc:    Oral  SpO2: 97% 96% 97%   Weight:      Height:        Last BM Date: 08/03/18  Intake/Output   Yesterday:  04/26 0701 - 04/27 0700 In: 703.7 [I.V.:646.5; IV Piggyback:57.2] Out: 1905 [Urine:1800; Drains:105] This shift:  No intake/output data recorded.  Bowel function:  Flatus: YES  BM:  YES  Drain: LUQ drain (over patch/ulcer)- serous  RLQ drain (pelvis/retrosplenic) - serous     Physical Exam:  General: Pt awake/alert/oriented x4 in no acute distress Eyes: PERRL, normal EOM.  Sclera clear.  No icterus Neuro: CN II-XII intact w/o focal sensory/motor deficits. Lymph: No head/neck/groin lymphadenopathy Psych: Pleasant and alert.  [facility] is [facility]  (Although told nurse [facility] ) knows she had emergency surgery.  This is the best she has been postop so far.  No psychosis/paranoia HENT: Normocephalic, Mucus membranes moist.  No thrush Neck: Supple, No tracheal deviation Chest: No chest wall pain w good excursion CV:  Pulses intact.  Regular rhythm MS: Normal AROM mjr joints.  No obvious deformity  Abdomen: Soft.  Nondistended.  Mildly tender at incisions only.  No evidence of peritonitis.  No incarcerated hernias.  Ext: Feet flexed and toe point.  Same as since admission.  Some rigidity at ankles.  No mjr edema.  No cyanosis Skin: No petechiae / purpura  Results:   Labs: Results for orders placed or performed during the hospital encounter of 07/30/18 (from the past 48 hour(s))  Glucose, capillary     Status: Abnormal   Collection Time: 08/02/18 12:08 PM  Result Value Ref Range   Glucose-Capillary 66 (L) 70 - 99 mg/dL  Glucose,  capillary     Status: Abnormal   Collection Time: 08/02/18 12:26 PM  Result Value Ref Range   Glucose-Capillary 124 (H) 70 - 99 mg/dL  Glucose, capillary     Status: Abnormal   Collection Time: 08/02/18  4:03 PM  Result Value Ref Range   Glucose-Capillary 100 (H) 70 - 99 mg/dL   Comment 1 Notify RN    Comment 2 Document in Chart   Glucose, capillary     Status: None   Collection Time: 08/02/18  8:09 PM  Result Value Ref Range   Glucose-Capillary 95 70 - 99 mg/dL  Glucose, capillary     Status: Abnormal   Collection Time: 08/03/18 12:23 AM  Result Value Ref Range   Glucose-Capillary 108 (H)  70 - 99 mg/dL  Glucose, capillary     Status: Abnormal   Collection Time: 08/03/18  4:20 AM  Result Value Ref Range   Glucose-Capillary 129 (H) 70 - 99 mg/dL  Glucose, capillary     Status: Abnormal   Collection Time: 08/03/18  8:19 AM  Result Value Ref Range   Glucose-Capillary 105 (H) 70 - 99 mg/dL  Glucose, capillary     Status: Abnormal   Collection Time: 08/03/18 11:56 AM  Result Value Ref Range   Glucose-Capillary 113 (H) 70 - 99 mg/dL  Glucose, capillary     Status: None   Collection Time: 08/03/18  4:00 PM  Result Value Ref Range   Glucose-Capillary 99 70 - 99 mg/dL  Glucose, capillary     Status: None   Collection Time: 08/03/18  9:17 PM  Result Value Ref Range   Glucose-Capillary 94 70 - 99 mg/dL  Glucose, capillary     Status: None   Collection Time: 08/04/18  7:36 AM  Result Value Ref Range   Glucose-Capillary 91 70 - 99 mg/dL   Comment 1 Notify RN    Comment 2 Document in Chart   CBC     Status: Abnormal   Collection Time: 08/04/18  7:47 AM  Result Value Ref Range   WBC 12.1 (H) 4.0 - 10.5 K/uL   RBC 3.87 3.87 - 5.11 MIL/uL   Hemoglobin 10.9 (L) 12.0 - 15.0 g/dL   HCT 10.3 (L) 15.9 - 45.8 %   MCV 91.0 80.0 - 100.0 fL   MCH 28.2 26.0 - 34.0 pg   MCHC 31.0 30.0 - 36.0 g/dL   RDW 59.2 92.4 - 46.2 %   Platelets 264 150 - 400 K/uL   nRBC 1.2 (H) 0.0 - 0.2 %     Comment: Performed at [facility], [address] [place], [place] [address]  Basic metabolic panel     Status: Abnormal   Collection Time: 08/04/18  7:47 AM  Result Value Ref Range   Sodium 134 (L) 135 - 145 mmol/L   Potassium 3.8 3.5 - 5.1 mmol/L   Chloride 108 98 - 111 mmol/L   CO2 19 (L) 22 - 32 mmol/L   Glucose, Bld 103 (H) 70 - 99 mg/dL   BUN 10 8 - 23 mg/dL   Creatinine, Ser 7.71 (L) 0.44 - 1.00 mg/dL   Calcium 7.8 (L) 8.9 - 10.3 mg/dL   GFR calc non Af Amer >60 >60 mL/min   GFR calc Af Amer >60 >60 mL/min   Anion gap 7 5 - 15    Comment: Performed at [facility], [address] [place], [place] [address]    Imaging / Studies: Dg Ugi W Single Cm (sol Or Thin Ba)  Result Date: 08/02/2018 CLINICAL DATA:  Status post omental patch for perforated gastric ulcer. EXAM: WATER SOLUBLE UPPER GI SERIES TECHNIQUE: Single-column upper GI series was performed using water soluble contrast. CONTRAST:  Omnipaque 180 COMPARISON:  None. FLUOROSCOPY TIME:  Fluoroscopy Time:  2 minutes and 48 seconds. Radiation Exposure Index (if provided by the fluoroscopic device): 31.3 mGy. Number of Acquired Spot Images: 0 FINDINGS: Initially, the NG tube was noted in the distal esophagus. It was advanced into the fundus of the stomach. Contrast injected fills the stomach as well as the duodenum. There is a focal outpouching of contrast at the lesser curvature of the antrum of the stomach. It measures 1.1 cm in maximal diameter. There is  a wide neck allowing passage of contrast into this outpouching. There is no Guled Gahan spillage of contrast into the peritoneal space. The stomach is otherwise within normal limits without intraluminal polypoid filling defect or stricture. The duodenum is also unremarkable. IMPRESSION: The study demonstrates a focal outpouching of contrast at the lesser curvature of the antrum, at the presumed location of the omental patch. There is no Carrel Leather spillage  of contrast into the peritoneal space. This may be the expected appearance of an omental patch over a gastric wall defect however a recurrent perforation is not excluded. Findings were discussed with Dr. Gaynelle Adu. Electronically Signed   By: Jolaine Click M.D.   On: 08/02/2018 13:00    Medications / Allergies: per chart  Antibiotics: Anti-infectives (From admission, onward)   Start     Dose/Rate Route Frequency Ordered Stop   07/31/18 0600  clindamycin (CLEOCIN) IVPB 900 mg     900 mg 100 mL/hr over 30 Minutes Intravenous On call to O.R. 07/30/18 2208 07/30/18 2352   07/31/18 0600  gentamicin (GARAMYCIN) 310 mg in dextrose 5 % 100 mL IVPB     5 mg/kg  61.2 kg 107.8 mL/hr over 60 Minutes Intravenous On call to O.R. 07/30/18 2208 07/31/18 0006   07/31/18 0600  cefTRIAXone (ROCEPHIN) 2 g in sodium chloride 0.9 % 100 mL IVPB    Note to Pharmacy:  Pharmacy may adjust dosing strength / duration / interval for maximal efficacy   2 g 200 mL/hr over 30 Minutes Intravenous Daily 07/31/18 0258 08/04/18 0616   07/31/18 0400  metroNIDAZOLE (FLAGYL) IVPB 500 mg     500 mg 100 mL/hr over 60 Minutes Intravenous Every 6 hours 07/31/18 0258 08/01/18 0546   07/31/18 0003  clindamycin (CLEOCIN) 900 mg, gentamicin (GARAMYCIN) 240 mg in sodium chloride 0.9 % 1,000 mL for intraperitoneal lavage  Status:  Discontinued       As needed 07/31/18 0003 07/31/18 0052   07/30/18 2249  clindamycin (CLEOCIN) 900 MG/50ML IVPB    Note to Pharmacy:  Mirian Mo   : cabinet override      07/30/18 2249 07/30/18 2322   07/30/18 2215  clindamycin (CLEOCIN) 900 mg, gentamicin (GARAMYCIN) 240 mg in sodium chloride 0.9 % 1,000 mL for intraperitoneal lavage  Status:  Discontinued    Note to Pharmacy:  Have in the  OR room for final irrigation in bowel surgery case to minimize risk of abscess/infection Pharmacy may adjust dosing strength, schedule, rate of infusion, etc as needed to optimize therapy    Irrigation To Surgery  07/30/18 2208 07/31/18 1024   07/30/18 2130  ceFEPIme (MAXIPIME) 2 g in sodium chloride 0.9 % 100 mL IVPB     2 g 200 mL/hr over 30 Minutes Intravenous  Once 07/30/18 2129 07/30/18 2232   07/30/18 2130  metroNIDAZOLE (FLAGYL) IVPB 500 mg     500 mg 100 mL/hr over 60 Minutes Intravenous  Once 07/30/18 2129 07/30/18 2306        Note: Portions of this report may have been transcribed using voice recognition software. Every effort was made to ensure accuracy; however, inadvertent computerized transcription errors may be present.   Any transcriptional errors that result from this process are unintentional.     Ardeth Sportsman, MD, FACS, MASCRS Gastrointestinal and Minimally Invasive Surgery    [address], [address] [place], [place] [address] [phone] Main / Paging [phone] Fax

## 2018-08-04 NOTE — Progress Notes (Addendum)
Interim Note  63y/o female with history of Huntington's disease admitted for laparoscopic Graham patch for perforated gastric ulcer.  Patient very agitated-surgery service called neurology PA for consultation.  Recommended Ativan 1-2 mg as needed, over the phone.  I called the patient's nurse at 6:30 PM-patient is apparently calm.  Has not been agitated since she has been on the floor.  Will speak to Dr. [name] tomorrow if he still would like formal consult.

## 2018-08-04 NOTE — Progress Notes (Signed)
Physical Therapy Treatment Patient Details Name: Natasha Frederick MRN: [id number] DOB: 12-29-1955 Today's Date: Frederick    History of Present Illness Pt is a 63 year old female s/p lap Natasha Frederick patch of perforated gastric ulcer on 07/30/18 by Dr. Michaell Frederick and has PMHx significant for Huntington's disease, scoliosis, back surgery    PT Comments    Pt assisted to standing x3 with use of stedy today for safety.  Pt will bilateral foot drop as well as numbness in her feet which she reports was not present prior to admission.  May benefit from [facility] to assist with feet positioning in supine.  Will continue to assist with mobility during acute stay.   Follow Up Recommendations  SNF;Supervision/Assistance - 24 hour     Equipment Recommendations  Wheelchair (measurements PT);Wheelchair cushion (measurements PT)    Recommendations for Other Services       Precautions / Restrictions Precautions Precautions: Fall Precaution Comments: 1 JP drain    Mobility  Bed Mobility Overal bed mobility: Needs Assistance Bed Mobility: Rolling;Sidelying to Sit Rolling: Min assist;+2 for safety/equipment Sidelying to sit: Min assist;+2 for safety/equipment       General bed mobility comments: performed log roll technique due to abdominal surgery, assist required for upper body  Transfers Overall transfer level: Needs assistance   Transfers: Sit to/from Stand Sit to Stand: Mod assist         General transfer comment: attempted with RW however pt not able to rise, utilized stedy and pt better able to assist, performed 3 sit to stands, pt also assisted with using bed pan on bed in seated position (requested to have BM while on stedy and no BSC in room)  Ambulation/Gait                 Stairs             Wheelchair Mobility    Modified Rankin (Stroke Patients Only)       Balance Overall balance assessment: Needs assistance Sitting-balance support: No upper extremity  supported;Feet supported Sitting balance-Leahy Scale: Fair     Standing balance support: Bilateral upper extremity supported Standing balance-Leahy Scale: Poor                              Cognition Arousal/Alertness: Awake/alert Behavior During Therapy: WFL for tasks assessed/performed Overall Cognitive Status: Within Functional Limits for tasks assessed                                 General Comments: much more engaged today and able to answer questions      Exercises      General Comments        Pertinent Vitals/Pain Pain Assessment: Faces Faces Pain Scale: Hurts a little bit Pain Location: abdomen Pain Descriptors / Indicators: Sore Pain Intervention(s): Monitored during session;Repositioned;Limited activity within patient's tolerance    Home Living                      Prior Function            PT Goals (current goals can now be found in the care plan section) Progress towards PT goals: Progressing toward goals    Frequency    Min 3X/week      PT Plan Current plan remains appropriate    Co-evaluation  AM-PAC PT "6 Clicks" Mobility   Outcome Measure  Help needed turning from your back to your side while in a flat bed without using bedrails?: A Little Help needed moving from lying on your back to sitting on the side of a flat bed without using bedrails?: A Little Help needed moving to and from a bed to a chair (including a wheelchair)?: A Lot Help needed standing up from a chair using your arms (e.g., wheelchair or bedside chair)?: A Lot Help needed to walk in hospital room?: A Lot Help needed climbing 3-5 steps with a railing? : Total 6 Click Score: 13    End of Session Equipment Utilized During Treatment: Gait belt Activity Tolerance: Patient tolerated treatment well Patient left: in chair;with nursing/sitter in room;with call bell/phone within reach(sitter present)   PT Visit Diagnosis:  Muscle weakness (generalized) (M62.81);Other abnormalities of gait and mobility (R26.89)     Time: [id number] PT Time Calculation (min) (ACUTE ONLY): 34 min  Charges:  $Therapeutic Activity: 23-37 mins                     Natasha Frederick, PT, DPT Acute Rehabilitation Services Office: [phone] Pager: [phone]   Sarajane JewsLEMYRE,Natasha Frederick, 5:00 PM

## 2018-08-05 LAB — CBC
HCT: 33.4 % — ABNORMAL LOW (ref 36.0–46.0)
Hemoglobin: 10.9 g/dL — ABNORMAL LOW (ref 12.0–15.0)
MCH: 28.9 pg (ref 26.0–34.0)
MCHC: 32.6 g/dL (ref 30.0–36.0)
MCV: 88.6 fL (ref 80.0–100.0)
Platelets: 334 10*3/uL (ref 150–400)
RBC: 3.77 MIL/uL — ABNORMAL LOW (ref 3.87–5.11)
RDW: 13.6 % (ref 11.5–15.5)
WBC: 16.8 10*3/uL — ABNORMAL HIGH (ref 4.0–10.5)
nRBC: 0.5 % — ABNORMAL HIGH (ref 0.0–0.2)

## 2018-08-05 LAB — POTASSIUM: Potassium: 4 mmol/L (ref 3.5–5.1)

## 2018-08-05 MED ORDER — PANTOPRAZOLE SODIUM 40 MG PO TBEC
40.0000 mg | DELAYED_RELEASE_TABLET | Freq: Two times a day (BID) | ORAL | Status: DC
  Administered 2018-08-05 – 2018-08-13 (×16): 40 mg via ORAL
  Filled 2018-08-05 (×16): qty 1

## 2018-08-05 MED ORDER — ACETAMINOPHEN 500 MG PO TABS
500.0000 mg | ORAL_TABLET | Freq: Three times a day (TID) | ORAL | Status: DC
  Administered 2018-08-05 – 2018-08-13 (×21): 500 mg via ORAL
  Filled 2018-08-05 (×24): qty 1

## 2018-08-05 MED ORDER — HYDROMORPHONE HCL 1 MG/ML IJ SOLN
0.5000 mg | INTRAMUSCULAR | Status: DC | PRN
  Administered 2018-08-05: 1 mg via INTRAVENOUS
  Filled 2018-08-05: qty 1

## 2018-08-05 MED ORDER — POLYETHYLENE GLYCOL 3350 17 G PO PACK
17.0000 g | PACK | Freq: Two times a day (BID) | ORAL | Status: DC
  Administered 2018-08-05 – 2018-08-07 (×5): 17 g via ORAL
  Filled 2018-08-05 (×6): qty 1

## 2018-08-05 MED ORDER — ENSURE SURGERY PO LIQD
237.0000 mL | Freq: Two times a day (BID) | ORAL | Status: AC
  Administered 2018-08-05 – 2018-08-10 (×4): 237 mL via ORAL
  Filled 2018-08-05 (×14): qty 237

## 2018-08-05 NOTE — Consult Note (Addendum)
Neurology Consultation  Reason for Consult: Agitation with Huntington's disease Referring Physician: Viviann SpareSteven gross   History is obtained from: Chart  HPI: Natasha Frederick is a 63 y.o. female with past medical history of Huntington's disease and was diagnosed in September 2019.  She is on Austedo which has helped her with reduction of Chorea  Surgery consulted neurology as she is agitated and there is question of the fact she may not be absorbing the medication as well secondary to perforated gastric ulcer.   On 08/04/2018 patient apparently was very agitated and neurology was called for recommendations on her agitation.  At that time was recommended that patient have Ativan 1 to 2 mg as needed.  Looking through note patient only received 1 dose of lorazepam 2 mg on 08/04/2018 at 2146.  Looking to the chart she has been receiving her Deutetrabenazine tabs twice daily.  Upon entering the room patient is very comfortable in her bed, acting appropriately.  She knows where she is.  She is little confused about if she is getting the right amount of medication for her Huntington's but I have talked to the nurse and she states that she is getting the correct amount of medication.     ROS: A 14 point ROS was performed and is negative except as noted in the HPI.  Past Medical History:  Diagnosis Date  . Arthritis    hip  . Endometriosis   . Scoliosis     Family History  Problem Relation Age of Onset  . Lung cancer Mother   . Stroke Father   . Huntington's disease Paternal Grandmother   . Huntington's disease Paternal Aunt   . Huntington's disease Paternal Uncle   . Arthritis Neg Hx        family hx  . Cancer Neg Hx        prostate ca -family hx  . Heart disease Neg Hx        family hx   Social History:   reports that she quit smoking about 24 years ago. Her smoking use included cigarettes. She has a 8.00 pack-year smoking history. She has never used smokeless tobacco. She reports current  alcohol use of about 3.0 standard drinks of alcohol per week. She reports that she does not use drugs.  Medications  Current Facility-Administered Medications:  .  acetaminophen (TYLENOL) tablet 500 mg, 500 mg, Oral, TID WC & HS, Karie SodaGross, Steven, MD, 500 mg at 08/05/18 1208 .  bisacodyl (DULCOLAX) suppository 10 mg, 10 mg, Rectal, Daily PRN, Karie SodaGross, Steven, MD .  6 CHG cloth bath - Day of surgery, , , Once **AND** Chlorhexidine Gluconate Cloth 2 % PADS 6 each, 6 each, Topical, Once, Karie SodaGross, Steven, MD .  Chlorhexidine Gluconate Cloth 2 % PADS 6 each, 6 each, Topical, Daily, Karie SodaGross, Steven, MD, 6 each at 08/05/18 [id number] .  Deutetrabenazine TABS 24 mg, 24 mg, Oral, BID, Karie SodaGross, Steven, MD, 24 mg at 08/05/18 0949 .  diphenhydrAMINE (BENADRYL) injection 12.5-25 mg, 12.5-25 mg, Intravenous, Q6H PRN, Karie SodaGross, Steven, MD, 12.5 mg at 08/04/18 1651 .  enoxaparin (LOVENOX) injection 40 mg, 40 mg, Subcutaneous, Q24H, Karie SodaGross, Steven, MD, 40 mg at 08/05/18 0802 .  feeding supplement (ENSURE SURGERY) liquid 237 mL, 237 mL, Oral, BID BM, Karie SodaGross, Steven, MD, 237 mL at 08/05/18 1532 .  hydrALAZINE (APRESOLINE) injection 5-20 mg, 5-20 mg, Intravenous, Q4H PRN, Karie SodaGross, Steven, MD .  HYDROmorphone (DILAUDID) injection 0.5-1 mg, 0.5-1 mg, Intravenous, Q4H PRN, Karie SodaGross, Steven, MD .  Mahlon Gammonlip  balm (CARMEX) ointment 1 application, 1 application, Topical, BID, Karie Soda, MD, 1 application at 08/05/18 0950 .  LORazepam (ATIVAN) injection 1-2 mg, 1-2 mg, Intravenous, Q6H PRN, Karie Soda, MD, 2 mg at 08/04/18 2146 .  magic mouthwash, 15 mL, Oral, QID PRN, Karie Soda, MD .  MEDLINE mouth rinse, 15 mL, Mouth Rinse, BID, Karie Soda, MD, 15 mL at 08/05/18 0950 .  methocarbamol (ROBAXIN) 500 mg in dextrose 5 % 50 mL IVPB, 500 mg, Intravenous, Q6H PRN, Karie Soda, MD, Stopped at 08/03/18 1314 .  metoprolol tartrate (LOPRESSOR) injection 5 mg, 5 mg, Intravenous, Q6H PRN, Karie Soda, MD .  ondansetron [facility]) injection 4 mg, 4  mg, Intravenous, Q8H PRN, Karie Soda, MD, 4 mg at 08/04/18 1503 .  pantoprazole (PROTONIX) EC tablet 40 mg, 40 mg, Oral, BID, Karie Soda, MD, 40 mg at 08/05/18 0950 .  polyethylene glycol (MIRALAX / GLYCOLAX) packet 17 g, 17 g, Oral, BID, Karie Soda, MD, 17 g at 08/05/18 0949 .  sodium chloride flush (NS) 0.9 % injection 10-40 mL, 10-40 mL, Intracatheter, Q12H, Karie Soda, MD, 10 mL at 08/05/18 0951 .  sodium chloride flush (NS) 0.9 % injection 10-40 mL, 10-40 mL, Intracatheter, PRN, Karie Soda, MD   Exam: Current vital signs: BP 126/75 (BP Location: Right Arm)   Pulse 89   Temp 98.8 F (37.1 C) (Oral)   Resp 17   Ht  (1.626 m)   Wt 64.2 kg   SpO2 98%   BMI 24.29 kg/m  Vital signs in last 24 hours: Temp:  [98.1 F (36.7 C)-98.9 F (37.2 C)] 98.8 F (37.1 C) (04/28 1340) Pulse Rate:  [85-94] 89 (04/28 1340) Resp:  [17-20] 17 (04/28 1340) BP: (126-149)/(75-82) 126/75 (04/28 1340) SpO2:  [98 %-99 %] 98 % (04/28 1340)  Physical Exam  Constitutional: Appears well-developed and well-nourished.  Psych: Affect appropriate to situation Eyes: No scleral injection HENT: No OP obstrucion Head: Normocephalic.  Cardiovascular: Normal rate and regular rhythm.  Respiratory: Effort normal, non-labored breathing GI: Soft.  No distension. There is no tenderness.  Skin: WDI  Neuro: Mental Status: Patient is awake, alert, oriented to person, place, month, year, and situation. No signs of aphasia or neglect Cranial Nerves: II: Visual Fields are full.  III,IV, VI: EOMI without ptosis or diploplia. Pupils equal, round and reactive to light V: Facial sensation is symmetric to temperature VII: Facial movement is symmetric.  VIII: hearing is intact to voice X: Palat elevates symmetrically XI: Shoulder shrug is symmetric. XII: tongue is midline without atrophy or fasciculations.  Motor: Patient has mild chorea-like movements especially in the upper extremities.  She is  wearing bilateral Unna boots however she states this is to help her with her walking. 4/5 strength bilaterally in the upper extremities with antigravity with the lower extremities however as stated she does have the Unna boots on. Sensory: Sensation is symmetric to light touch and temperature in the arms and legs. Deep Tendon Reflexes: 2+ and symmetric in the biceps and patellae.  Cerebellar Finger-to-nose slightly dysmetric secondary to the choreiform movements  Labs I have reviewed labs in epic and the results pertinent to this consultation are:   CBC    Component Value Date/Time   WBC 16.8 (H) 08/05/2018 0839   RBC 3.77 (L) 08/05/2018 0839   HGB 10.9 (L) 08/05/2018 0839   HCT 33.4 (L) 08/05/2018 0839   PLT 334 08/05/2018 0839   MCV 88.6 08/05/2018 0839   MCH 28.9 08/05/2018 0839  MCHC 32.6 08/05/2018 0839   RDW 13.6 08/05/2018 0839   LYMPHSABS 0.2 (L) 07/30/2018 2001   MONOABS 0.1 07/30/2018 2001   EOSABS 0.0 07/30/2018 2001   BASOSABS 0.0 07/30/2018 2001    CMP     Component Value Date/Time   NA 134 (L) 08/04/2018 0747   K 4.0 08/05/2018 0839   CL 108 08/04/2018 0747   CO2 19 (L) 08/04/2018 0747   GLUCOSE 103 (H) 08/04/2018 0747   BUN 10 08/04/2018 0747   CREATININE 0.43 (L) 08/04/2018 0747   CALCIUM 7.8 (L) 08/04/2018 0747   PROT 5.8 (L) 07/30/2018 2001   ALBUMIN 3.2 (L) 07/30/2018 2001   AST 44 (H) 07/30/2018 2001   ALT 23 07/30/2018 2001   ALKPHOS 52 07/30/2018 2001   BILITOT 0.8 07/30/2018 2001   GFRNONAA >60 08/04/2018 0747   GFRAA >60 08/04/2018 0747    Lipid Panel     Component Value Date/Time   CHOL 200 10/09/2017 1023   TRIG 113.0 10/09/2017 1023   HDL 68.70 10/09/2017 1023   CHOLHDL 3 10/09/2017 1023   VLDL 22.6 10/09/2017 1023   LDLCALC 108 (H) 10/09/2017 1023     Imaging I have reviewed the images obtained:  Felicie Morn PA-C [facility] [phone]  M-F  (9:00 am- 5:00 PM)  08/05/2018, 4:38 PM     Assessment:   63 year old female who is been diagnosed with Huntington's disease.  Currently on Deutetrabenazine tabs twice daily and receiving this while in the hospital.  At this time patient is not at all agitated.  She is acting appropriately and answering questions appropriately.  I question if her agitation was more of a delirium than due to Huntington's.  Impression: -Huntington's disease  Recommendations:  Continue with Ativan 2 mg if needed Continue with Deutetrabenazine tabs twice daily as prescribed by her neurologist  Patient has had similar walking issues with other surgeries and became back to normal after rehab. He is in agreement about rehabilition.    NEUROHOSPITALIST ADDENDUM Performed a face to face diagnostic evaluation.   I have reviewed the contents of history and physical exam as documented by PA/ARNP/Resident and agree with above documentation.  I have discussed and formulated the above plan as documented. Edits to the note have been made as needed.  Natasha Frederick is a pleasant 63 year old female diagnosed with Huntington's disease in 2019 after genetic testing revealed 63 CAG Repeats.  She sees Dr. Arlana Pouch at [facility], last visit was 04/29/18.  She is on duetetrabenazine 24 mg twice daily and prior to admission has been doing quite well with satisfactory control of her chorea.  Patient admitted for abdominal pain due to perforated gastric ulcer requiring emergent lap status post [name] patch for perforated gastric ulcer. Patient has been receiving Austedo (  duetetrabenazine ) since 4/24.  Neurology was consulted as prior to admission patient was able to ambulate and has now been having significant difficulty with walking.  Also was agitated which is subsequently improved.   On examination Alert oriented to place person and time.  She has choreiform movements in the upper extremities with 4+ by 5 strength and good grip strength.  Lower extremities move with 4 out of  5 strength bilaterally with less degree of chorea, noted to have spasticity in her lower extremities.  Lower extremity PRAFOs. Reflexes brisk over lower extremities.   Per nurse, Patient's mental status appears to been improving.    Impression:  Functional decline in patient Huntington's disease  Patient with Huntington's disease consulted for functional decline following surgery.  Stressors such as surgery as well as undergoing general anesthesia can worsen psychiatric and chorea symptoms of Huntington's disease.  Patient also noted to have ileus during admission which could impair absorption of Austedo.   Recommendations Continue PT/ OT and would likely benefit from admission in rehab facility Continue Austedo 24 mg twice daily Avoid anticholinergics Ativan as needed q6h for anxiety, can help with muscle spasms as well.  Continue muscle relaxants   Outpatient follow up with patient's neurologist.   Georgiana Spinner Aroor MD [facility] [phone]   If 7pm to 7am, please call on call as listed on AMION.

## 2018-08-05 NOTE — Progress Notes (Signed)
Physical Therapy Treatment Patient Details Name: Natasha Frederick MRN: [id number] DOB: 16-Jun-1955 Today's Date: 08/05/2018    History of Present Illness Pt is a 63 year old female s/p lap [name] patch of perforated gastric ulcer on 07/30/18 by Dr. [name] and has PMHx significant for Huntington's disease, scoliosis, back surgery    PT Comments    Pt assisted from recliner to [facility] (requested to urinate).  Pt then assisted back to bed. Utilized stedy for transfer as pt better able to self assist.  Pt repositioned in supine and PRAFOs applied (requested RN check PRAFOs and foot positioning later in shift). Continue to recommend SNF upon d/c.    Follow Up Recommendations  SNF;Supervision/Assistance - 24 hour     Equipment Recommendations  Wheelchair (measurements PT);Wheelchair cushion (measurements PT)    Recommendations for Other Services       Precautions / Restrictions Precautions Precautions: Fall Precaution Comments: 1 JP drain    Mobility  Bed Mobility Overal bed mobility: Needs Assistance Bed Mobility: Sit to Supine       Sit to supine: Mod assist   General bed mobility comments: requested pt assist with sidelying for log roll however pt initiated supine so assisted with LEs onto bed  Transfers Overall transfer level: Needs assistance   Transfers: Sit to/from Stand Sit to Stand: Max assist;+2 physical assistance         General transfer comment: requiring more assist for back to bed, attemped with RW however pt again unable, pillow for knees blocked with stedy and pt able to better self assist upright, stedy to [facility] and then back to bed  Ambulation/Gait                 Stairs             Wheelchair Mobility    Modified Rankin (Stroke Patients Only)       Balance Overall balance assessment: Needs assistance                                          Cognition Arousal/Alertness: Awake/alert Behavior During Therapy: WFL for tasks  assessed/performed   Area of Impairment: Safety/judgement;Following commands                       Following Commands: Follows one step commands with increased time Safety/Judgement: Decreased awareness of safety     General Comments: pt had more difficulty time following and performing tasks today      Exercises      General Comments        Pertinent Vitals/Pain Pain Assessment: Faces Faces Pain Scale: Hurts little more Pain Location: abdomen and knees (with stedy - placed pillow and no pain) Pain Descriptors / Indicators: Sore Pain Intervention(s): Limited activity within patient's tolerance;Monitored during session;Repositioned    Home Living                      Prior Function            PT Goals (current goals can now be found in the care plan section) Progress towards PT goals: Progressing toward goals    Frequency    Min 3X/week      PT Plan Current plan remains appropriate    Co-evaluation              AM-PAC PT "6 Clicks"  Mobility   Outcome Measure  Help needed turning from your back to your side while in a flat bed without using bedrails?: A Little Help needed moving from lying on your back to sitting on the side of a flat bed without using bedrails?: A Lot Help needed moving to and from a bed to a chair (including a wheelchair)?: A Lot Help needed standing up from a chair using your arms (e.g., wheelchair or bedside chair)?: A Lot Help needed to walk in hospital room?: A Lot Help needed climbing 3-5 steps with a railing? : Total 6 Click Score: 12    End of Session Equipment Utilized During Treatment: Gait belt Activity Tolerance: Patient tolerated treatment well Patient left: with nursing/sitter in room;in bed;with bed alarm set(sitter present) Nurse Communication: Mobility status;Need for lift equipment PT Visit Diagnosis: Muscle weakness (generalized) (M62.81);Other abnormalities of gait and mobility (R26.89)      Time: [id number] PT Time Calculation (min) (ACUTE ONLY): 25 min  Charges:  $Therapeutic Activity: 23-37 mins                    Zenovia JarredKati Lemyre, PT, DPT Acute Rehabilitation Services Office: [phone] Pager: [phone]  Sarajane JewsLEMYRE,KATHrine E 08/05/2018, 3:31 PM

## 2018-08-05 NOTE — TOC Initial Note (Addendum)
Transition of Care [facility](TOC) - Initial/Assessment Note    Patient Details  Name: Natasha BeckmannJudith Muraski MRN: [id number] Date of Birth: 11/16/1955  Transition of Care [facility](TOC) CM/SW Contact:    Montine CircleNovia C Stevenson, LCSW Phone Number: 08/05/2018, 2:24 PM  Clinical Narrative:  Patient was admitted for Abdominal pain with pneumoperitoneum. Probable perforated gastric ulcer.                 Spoke with patient's spouse about patient's d/c plan to SNF. Patient's spouse reports that patient would benefit from going to SNF due to limited physical abilities and having confusion at home. Patient's spouse helps with her ADL's but finds it difficult due to his age and own abilities to care for her. He has to assist her in walking. He reports [facility] for about a month and did very well there. He would like for patient to go back to [facility], but is open to other options.   FL2 has been completed awaiting bed offers. PASSAR received [phone]   Expected Discharge Plan: Skilled Nursing Facility Barriers to Discharge: No SNF bed   Patient Goals and CMS Choice Patient states their goals for this hospitalization and ongoing recovery are:: Per husband - to have patient return home after PT in SNF CMS Medicare.gov Compare Post Acute Care list provided to:: Patient Represenative (must comment)(Spouse) Choice offered to / list presented to : Spouse  Expected Discharge Plan and Services Expected Discharge Plan: Skilled Nursing Facility In-house Referral: Clinical Social Work   Post Acute Care Choice: Skilled Nursing Facility Living arrangements for the past 2 months: Single Family Home Expected Discharge Date: (unknown)                                    Prior Living Arrangements/Services Living arrangements for the past 2 months: Single Family Home Lives with:: Spouse Patient language and need for interpreter reviewed:: No Do you feel safe going back  to the place where you live?: Yes      Need for Family Participation in Patient Care: Yes (Comment)(due to patient's limited cognitive ability) Care giver support system in place?: Yes (comment)(spouse)   Criminal Activity/Legal Involvement Pertinent to Current Situation/Hospitalization: No - Comment as needed  Activities of Daily Living Home Assistive Devices/Equipment: Cane (specify quad or straight), Eyeglasses, Walker (specify type)(front wheeled walker, single point cane-if needed) ADL Screening (condition at time of admission) Patient's cognitive ability adequate to safely complete daily activities?: Yes Is the patient deaf or have difficulty hearing?: No Does the patient have difficulty seeing, even when wearing glasses/contacts?: No Does the patient have difficulty concentrating, remembering, or making decisions?: No Patient able to express need for assistance with ADLs?: Yes Does the patient have difficulty dressing or bathing?: Yes Independently performs ADLs?: No Communication: Independent Dressing (OT): Needs assistance Is this a change from baseline?: Change from baseline, expected to last >3 days Grooming: Needs assistance Is this a change from baseline?: Change from baseline, expected to last >3 days Feeding: Needs assistance Is this a change from baseline?: Change from baseline, expected to last >3 days Bathing: Needs assistance Is this a change from baseline?: Pre-admission baseline Toileting: Dependent Is this a change from baseline?: Change from baseline, expected to last >3days In/Out Bed: Dependent Is this a change from baseline?: Change from baseline, expected to last >3 days Walks in Home: Dependent Is this a change  from baseline?: Change from baseline, expected to last >3 days Does the patient have difficulty walking or climbing stairs?: Yes Weakness of Legs: Both Weakness of Arms/Hands: Both  Permission Sought/Granted Permission sought to share information  with : Family Supports Permission granted to share information with : (Spoke with spouse due to patient's limited cognitive ability)  Share Information with NAME: Rickard Rhymes  Permission granted to share info w AGENCY: SNF  Permission granted to share info w Relationship: spouse  Permission granted to share info w Contact Information: [phone]  Emotional Assessment Appearance:: Other (Comment Required, Appears stated age Attitude/Demeanor/Rapport: Unable to Assess Affect (typically observed): Unable to Assess Orientation: : Oriented to Self Alcohol / Substance Use: Not Applicable Psych Involvement: No (comment)  Admission diagnosis:  Acute gastric ulcer with perforation (HCC) [K25.1] Perforated gastric ulcer (HCC) [K25.5] Patient Active Problem List   Diagnosis Date Noted  . Delirium 08/01/2018  . Perforated gastric ulcer s/p omental patch 07/30/2018 07/31/2018  . Bile peritonitis (HCC) 07/31/2018  . Pneumoperitoneum 07/30/2018  . Long term current use of non-steroidal anti-inflammatories (NSAID) 07/30/2018  . ARF (acute renal failure) (HCC) 07/30/2018  . 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  . Primary osteoarthritis of left hip 07/19/2014  . Scoliosis of lumbar spine 01/05/2014   PCP:  Kristian Covey, MD Pharmacy:   [facility] [phone] - [place], [place] - [address] [facility] [address] [place] [address] Phone: [phone] Fax: [phone]  [facility] Sites [place], [place] - [address] [place] [address] Phone: [phone] Fax: [phone]  [facility] - [place], [place] - [address] [place] [address] Phone: [phone] Fax: [phone]     Social Determinants of Health (SDOH) Interventions    Readmission Risk Interventions No flowsheet data found.

## 2018-08-05 NOTE — Progress Notes (Signed)
Occupational Therapy Treatment Patient Details Name: Natasha Frederick MRN: [id number] DOB: 12/17/1955 Today's Date: 08/05/2018    History of present illness Pt is a 63 year old female s/p lap Graham patch of perforated gastric ulcer on 07/30/18 by Dr. Michaell CowingGross and has PMHx significant for Huntington's disease, scoliosis, back surgery   OT comments  Pt much more alert than evaluation.She did have difficulty understanding me with mask on.  Used stedy for up to chair.  Pt able to move bil arms to help wash face. Still has generalized weakness.   Follow Up Recommendations  SNF;Supervision/Assistance - 24 hour    Equipment Recommendations  None recommended by OT    Recommendations for Other Services      Precautions / Restrictions Precautions Precautions: Fall Precaution Comments: 1 JP drain Restrictions Weight Bearing Restrictions: No       Mobility Bed Mobility     Rolling: Mod assist Sidelying to sit: Mod assist          Transfers       Sit to Stand: Mod assist;+2 safety/equipment         General transfer comment: from bed. Pt scooted feet forward due to discomfort    Balance     Sitting balance-Leahy Scale: Fair Sitting balance - Comments: min guard for safety                                   ADL either performed or assessed with clinical judgement   ADL                                         General ADL Comments: grooming:  mod A for washing face     Vision       Perception     Praxis      Cognition Arousal/Alertness: Awake/alert Behavior During Therapy: WFL for tasks assessed/performed  bright affect                                 General Comments: difficult to determine cognition vs hearing:  followed most one step commands        Exercises     Shoulder Instructions       General Comments pt is able to push nurse light with extra time/effort and several attempts.  Soft touch requested and  brought into room    Pertinent Vitals/ Pain       Pain Assessment: Faces Faces Pain Scale: Hurts little more Pain Location: feet Pain Descriptors / Indicators: Aching Pain Intervention(s): Limited activity within patient's tolerance;Monitored during session;Repositioned  Home Living                                          Prior Functioning/Environment              Frequency  Min 2X/week        Progress Toward Goals  OT Goals(current goals can now be found in the care plan section)  Progress towards OT goals: Progressing toward goals     Plan      Co-evaluation  AM-PAC OT "6 Clicks" Daily Activity     Outcome Measure   Help from another person eating meals?: Total Help from another person taking care of personal grooming?: A Lot Help from another person toileting, which includes using toliet, bedpan, or urinal?: Total Help from another person bathing (including washing, rinsing, drying)?: Total Help from another person to put on and taking off regular upper body clothing?: Total Help from another person to put on and taking off regular lower body clothing?: Total 6 Click Score: 7    End of Session    OT Visit Diagnosis: Unsteadiness on feet (R26.81);Muscle weakness (generalized) (M62.81)   Activity Tolerance Patient tolerated treatment well   Patient Left in chair;with call bell/phone within reach;with chair alarm set   Nurse Communication          Time: [phone] OT Time Calculation (min): 37 min  Charges: OT General Charges $OT Visit: 1 Visit OT Treatments $Self Care/Home Management : 8-22 mins $Therapeutic Activity: 8-22 mins  Marica Otter, OTR/L Acute Rehabilitation Services [phone] [facility] pager [phone] office 08/05/2018   , 08/05/2018, 10:32 AM

## 2018-08-05 NOTE — NC FL2 (Signed)
[employer] MEDICAID FL2 LEVEL OF CARE SCREENING TOOL     IDENTIFICATION  Patient Name: Natasha Frederick Birthdate: 07/14/1955 Sex: female Admission Date (Current Location): 07/30/2018  [facility] [place] and [place] Number:  Producer, television/film/video and Address:  [facility],  [address] [place] [address]      Provider Number: [id number]  Attending Physician Name and Address:  Montez Morita Md, MD  Relative Name and Phone Number:  Krysteena Assad - husband [phone]    Current Level of Care: Hospital Recommended Level of Care: Skilled Nursing Facility Prior Approval Number:    Date Approved/Denied:   PASRR Number:    Discharge Plan: SNF    Current Diagnoses: Patient Active Problem List   Diagnosis Date Noted  . Delirium 08/01/2018  . Perforated gastric ulcer s/p omental patch 07/30/2018 07/31/2018  . Bile peritonitis (HCC) 07/31/2018  . Pneumoperitoneum 07/30/2018  . Long term current use of non-steroidal anti-inflammatories (NSAID) 07/30/2018  . ARF (acute renal failure) (HCC) 07/30/2018  . 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  . Primary osteoarthritis of left hip 07/19/2014  . Scoliosis of lumbar spine 01/05/2014    Orientation RESPIRATION BLADDER Height & Weight     Self(does not answer appropriately at times due to confusion)  Normal Continent Weight: 141 lb 8.6 oz (64.2 kg) Height:  5\' 4"  (162.6 cm)  BEHAVIORAL SYMPTOMS/MOOD NEUROLOGICAL BOWEL NUTRITION STATUS      Continent Diet(see discharge summary)  AMBULATORY STATUS COMMUNICATION OF NEEDS Skin   Limited Assist(has foot drop) Verbally Normal                       Personal Care Assistance Level of Assistance  Bathing, Feeding, Dressing Bathing Assistance: Limited assistance Feeding assistance: Limited assistance Dressing Assistance: Limited assistance     Functional Limitations Info  Sight, Hearing, Speech Sight Info: Adequate Hearing Info: Adequate Speech Info: Adequate    SPECIAL CARE FACTORS  FREQUENCY  PT (By licensed PT), OT (By licensed OT)     PT Frequency: 5x weekly OT Frequency: 5x weekly            Contractures Contractures Info: Not present    Additional Factors Info  Code Status, Allergies Code Status Info: Full Allergies Info: Bee venom, NSAIDS, adhesives, codine, penicillins, sulfa antibiotics            Current Medications (08/05/2018):  This is the current hospital active medication list Current Facility-Administered Medications  Medication Dose Route Frequency Provider Last Rate Last Dose  . acetaminophen (TYLENOL) tablet 500 mg  500 mg Oral TID WC & HS Karie Soda, MD      . bisacodyl (DULCOLAX) suppository 10 mg  10 mg Rectal Daily PRN Karie Soda, MD      . Chlorhexidine Gluconate Cloth 2 % PADS 6 each  6 each Topical Once Karie Soda, MD      . Chlorhexidine Gluconate Cloth 2 % PADS 6 each  6 each Topical Daily Karie Soda, MD   6 each at 08/05/18 [phone]  . Deutetrabenazine TABS 24 mg  24 mg Oral BID Karie Soda, MD   24 mg at 08/05/18 0949  . diphenhydrAMINE (BENADRYL) injection 12.5-25 mg  12.5-25 mg Intravenous Q6H PRN Karie Soda, MD   12.5 mg at 08/04/18 1651  . enoxaparin (LOVENOX) injection 40 mg  40 mg Subcutaneous Q24H Karie Soda, MD   40 mg at 08/05/18 0802  . feeding supplement (ENSURE SURGERY) liquid 237 mL  237 mL Oral BID BM  Karie SodaGross, Steven, MD      . hydrALAZINE (APRESOLINE) injection 5-20 mg  5-20 mg Intravenous Q4H PRN Karie SodaGross, Steven, MD      . HYDROmorphone (DILAUDID) injection 0.5-1 mg  0.5-1 mg Intravenous Q4H PRN Karie SodaGross, Steven, MD      . lip balm (CARMEX) ointment 1 application  1 application Topical BID Karie SodaGross, Steven, MD   1 application at 08/05/18 0950  . LORazepam (ATIVAN) injection 1-2 mg  1-2 mg Intravenous Q6H PRN Karie SodaGross, Steven, MD   2 mg at 08/04/18 2146  . magic mouthwash  15 mL Oral QID PRN Karie SodaGross, Steven, MD      . MEDLINE mouth rinse  15 mL Mouth Rinse BID Karie SodaGross, Steven, MD   15 mL at 08/05/18 0950  .  methocarbamol (ROBAXIN) 500 mg in dextrose 5 % 50 mL IVPB  500 mg Intravenous Q6H PRN Karie SodaGross, Steven, MD   Stopped at 08/03/18 1314  . metoprolol tartrate (LOPRESSOR) injection 5 mg  5 mg Intravenous Q6H PRN Karie SodaGross, Steven, MD      . ondansetron [facility](ZOFRAN) injection 4 mg  4 mg Intravenous Q8H PRN Karie SodaGross, Steven, MD   4 mg at 08/04/18 1503  . pantoprazole (PROTONIX) EC tablet 40 mg  40 mg Oral BID Karie SodaGross, Steven, MD   40 mg at 08/05/18 0950  . polyethylene glycol (MIRALAX / GLYCOLAX) packet 17 g  17 g Oral BID Karie SodaGross, Steven, MD   17 g at 08/05/18 0949  . sodium chloride flush (NS) 0.9 % injection 10-40 mL  10-40 mL Intracatheter Catha GosselinQ12H Gross, Steven, MD   10 mL at 08/05/18 0951  . sodium chloride flush (NS) 0.9 % injection 10-40 mL  10-40 mL Intracatheter PRN Karie SodaGross, Steven, MD         Discharge Medications: Please see discharge summary for a list of discharge medications.  Relevant Imaging Results:  Relevant Lab Results:   Additional Information SS# [id number]  Montine CircleNovia C Stevenson, LCSW

## 2018-08-05 NOTE — Progress Notes (Addendum)
Natasha Frederick [id number] 12-May-1955  CARE TEAM:  PCP: Kristian Covey, MD  Outpatient Care Team: Patient Care Team: Kristian Covey, MD as PCP - General (Family Medicine) Aliene Beams, MD as Referring Physician (Neurosurgery) Tat, Octaviano Batty, DO as Consulting Physician (Neurology) Karie Soda, MD as Consulting Physician (General Surgery) Wynema Birch, MD as Consulting Physician (Neurology)  Inpatient Treatment Team: Treatment Team: Attending Provider: Montez Morita, Md, MD; Consulting Physician: Montez Morita, Md, MD; Registered Nurse: Sherren Kerns, RN; Registered Nurse: Koren Shiver, RN; Registered Nurse: Lenord Fellers, RN; Consulting Physician: Aroor, Dara Lords, MD; Physical Therapist: Lynnell Catalan, PT; Rounding Team: Kym Groom, MD; Registered Nurse: Basilia Jumbo, RN; Occupational Therapist: Marica Otter, OT; Registered Nurse: Elbert Ewings, RN   Problem List:   Principal Problem:   Perforated gastric ulcer s/p omental patch 07/30/2018 Active Problems:   Huntington disease (HCC)   Pneumoperitoneum   Scoliosis of lumbar spine   Primary osteoarthritis of left hip   Long term current use of non-steroidal anti-inflammatories (NSAID)   ARF (acute renal failure) (HCC)   Bile peritonitis (HCC)   Delirium   6 Days Post-Op  07/30/2018  POST-OPERATIVE DIAGNOSIS:   Perforated gastric ulcer Biliary Peritonitis with Intra-abdominal abcesses.    PROCEDURE:  OMENTAL (GRAHAM) PATCH LAPAROSCOPY DIAGNOSTIC WASHOUT/DRAINAGE OF INTRAABDOMINAL ASBCESSES  SURGEON:  Ardeth Sportsman, MD  Assessment  Guarded but stabilizing  [facility] Stay = 6 days)  Plan:  S/p lap Graham patch of perforated gastric ulcer - Dr. [name]             PPI.  Switch to p.o. when safe              UGI 4/25 showed contained leak.  Drain amylase in normal range and similar to serum = no leak.   Drain output low and serous = Not c/w major leak.  .  Tolerating clear liquids.  No  worsening pain although patient not a reliable historian.  Advance diet gradually  Continue surgical drain for now.  If output remains low and no decline on solid meals, remove prior to discharge   Hpylori begative by serology.      Follow up with GI postop for EGD in about 6 weeks to ensure ulcer has healed & no cancer  NO NSAIDS         Hyponatremia- resolved  AKI-resolved.  Foley removed 4/25  Huntingtons Disease -  continue home Deutetrabenazine PO as tolerated (challenge w perf ulcer surgery & expected ileus).    Neurologist is at [facility] Dr [name].      Discussed with Dr. Laurence Slate this morning.  Delirium is under better control.  No strong evidence of leak or obstruction at this time.  Hopefully can get the patient back on their baseline Huntington's meds more reliably.  Ativan as needed.  However given the decline in activity and status, I again requested formal evaluation to see the patient.  Make sure were not missing anything else.  Suspicion by neurology is that she is had some regression due to the need for emergency surgery which can happen in patients with Huntington or other chronic neurological issues.  She may improve or this may be her new baseline.  Hard to say.  Having physical/occupational therapy evaluate.  Most likely will need skilled facility at discharge  ID- Rocephin/flagyl x5 days.  Completed.  CT scan to rule out abscess if worsening leukocytosis/ileus  Foley- dc 4/25  Hypoglycemia - resolved  with D5NS  VTE- lovenox, scds  Delirium-seems to be finally improving.  IV lorazepam PRN ordered per Neurology recs.  Reordered sitter for 1 more day until neurology can follow given fall risk and confusion.  Fair mobility.  PT/OT following.  Foot splints ordered.  OOB - is max assist per nurse.  Patient's husband states patient was walking with somewhat of assistance prior to hospitalization. She had a walker and cane but did not use it.  She would  use it for balance.  He states that her feet were in normal position as best he could remember but feet have been in plantar flexion since in the ED. Asked neurology to help assess      45 minutes spent in review, evaluation, examination, counseling, and coordination of care.  More than 50% of that time was spent in counseling.  08/05/2018    Subjective: (Chief complaint)  Transferred to floor.  No major events.  Knows that she is in the hospital but otherwise confused    Objective:  Vital signs:  Vitals:   08/04/18 1000 08/04/18 1027 08/04/18 2033 08/05/18 0547  BP: (!) 147/74  (!) 149/80 135/82  Pulse: 83 90 94 85  Resp: (!) Temp:   98.9 F (37.2 C) 98.1 F (36.7 C)  TempSrc:   Oral Oral  SpO2: 100% 100% 98% 99%  Weight:      Height:        Last BM Date: 08/03/18  Intake/Output   Yesterday:  04/27 0701 - 04/28 0700 In: 1835.4 [P.O.:1310; I.V.:525.4] Out: 730 [Urine:700; Emesis/NG output:5; Drains:25] This shift:  No intake/output data recorded.  Bowel function:  Flatus: YES  BM:  YES  Drain: LUQ drain (over patch/ulcer)- serous      Physical Exam:  General: Pt awake/alert/oriented x2 in no acute distress Eyes: PERRL, normal EOM.  Sclera clear.  No icterus Neuro: CN II-XII intact w/o focal sensory/motor deficits. Lymph: No head/neck/groin lymphadenopathy Psych: Pleasant and alert.  Knows she is in [place]   No psychosis/paranoia HENT: Normocephalic, Mucus membranes moist.  No thrush Neck: Supple, No tracheal deviation Chest: No chest wall pain w good excursion CV:  Pulses intact.  Regular rhythm MS: Normal AROM mjr joints.  No obvious deformity  Abdomen: Soft.  Mildy distended.  Nontender.  No evidence of peritonitis.  No incarcerated hernias.  Ext: Feet flexed at toe point.  Same as since admission.  Some rigidity at ankles.  No mjr edema.  No cyanosis  Skin: No petechiae / purpura  Results:   Labs: Results for orders  placed or performed during the hospital encounter of 07/30/18 (from the past 48 hour(s))  Glucose, capillary     Status: Abnormal   Collection Time: 08/03/18  8:19 AM  Result Value Ref Range   Glucose-Capillary 105 (H) 70 - 99 mg/dL  Glucose, capillary     Status: Abnormal   Collection Time: 08/03/18 11:56 AM  Result Value Ref Range   Glucose-Capillary 113 (H) 70 - 99 mg/dL  Glucose, capillary     Status: None   Collection Time: 08/03/18  4:00 PM  Result Value Ref Range   Glucose-Capillary 99 70 - 99 mg/dL  Glucose, capillary     Status: None   Collection Time: 08/03/18  9:17 PM  Result Value Ref Range   Glucose-Capillary 94 70 - 99 mg/dL  Glucose, capillary     Status: None   Collection Time: 08/04/18  7:36 AM  Result  Value Ref Range   Glucose-Capillary 91 70 - 99 mg/dL   Comment 1 Notify RN    Comment 2 Document in Chart   CBC     Status: Abnormal   Collection Time: 08/04/18  7:47 AM  Result Value Ref Range   WBC 12.1 (H) 4.0 - 10.5 K/uL   RBC 3.87 3.87 - 5.11 MIL/uL   Hemoglobin 10.9 (L) 12.0 - 15.0 g/dL   HCT 83.6 (L) 62.9 - 47.6 %   MCV 91.0 80.0 - 100.0 fL   MCH 28.2 26.0 - 34.0 pg   MCHC 31.0 30.0 - 36.0 g/dL   RDW 54.6 50.3 - 54.6 %   Platelets 264 150 - 400 K/uL   nRBC 1.2 (H) 0.0 - 0.2 %    Comment: Performed at [facility], [address], [place], [place] [address]  Basic metabolic panel     Status: Abnormal   Collection Time: 08/04/18  7:47 AM  Result Value Ref Range   Sodium 134 (L) 135 - 145 mmol/L   Potassium 3.8 3.5 - 5.1 mmol/L   Chloride 108 98 - 111 mmol/L   CO2 19 (L) 22 - 32 mmol/L   Glucose, Bld 103 (H) 70 - 99 mg/dL   BUN 10 8 - 23 mg/dL   Creatinine, Ser 7.51 (L) 0.44 - 1.00 mg/dL   Calcium 7.8 (L) 8.9 - 10.3 mg/dL   GFR calc non Af Amer >60 >60 mL/min   GFR calc Af Amer >60 >60 mL/min   Anion gap 7 5 - 15    Comment: Performed at [facility], [address] [place], [place] [address]  Amylase      Status: Abnormal   Collection Time: 08/04/18  7:47 AM  Result Value Ref Range   Amylase 195 (H) 28 - 100 U/L    Comment: Performed at [facility], [address], [place], [place] [address]  Amylase, pleural or peritoneal fluid     Status: None   Collection Time: 08/04/18  9:41 AM  Result Value Ref Range   Amylase, Fluid 109 U/L    Comment: NO NORMAL RANGE ESTABLISHED FOR THIS TEST Performed at [facility], [address], [place], [place] [address]    Fluid Type-FAMY PERITONEAL CAVITY     Comment: Performed at [facility], [address], [place], [place] [address]    Imaging / Studies: No results found.  Medications / Allergies: per chart  Antibiotics: Anti-infectives (From admission, onward)   Start     Dose/Rate Route Frequency Ordered Stop   07/31/18 0600  clindamycin (CLEOCIN) IVPB 900 mg     900 mg 100 mL/hr over 30 Minutes Intravenous On call to O.R. 07/30/18 2208 07/30/18 2352   07/31/18 0600  gentamicin (GARAMYCIN) 310 mg in dextrose 5 % 100 mL IVPB     5 mg/kg  61.2 kg 107.8 mL/hr over 60 Minutes Intravenous On call to O.R. 07/30/18 2208 07/31/18 0006   07/31/18 0600  cefTRIAXone (ROCEPHIN) 2 g in sodium chloride 0.9 % 100 mL IVPB    Note to Pharmacy:  Pharmacy may adjust dosing strength / duration / interval for maximal efficacy   2 g 200 mL/hr over 30 Minutes Intravenous Daily 07/31/18 0258 08/04/18 0616   07/31/18 0400  metroNIDAZOLE (FLAGYL) IVPB 500 mg     500 mg 100 mL/hr over 60 Minutes Intravenous Every 6 hours 07/31/18 0258 08/01/18 0546   07/31/18 0003  clindamycin (CLEOCIN) 900 mg, gentamicin (GARAMYCIN) 240 mg in sodium chloride 0.9 % 1,000 mL for intraperitoneal lavage  Status:  Discontinued       As needed 07/31/18 0003 07/31/18 0052   07/30/18 2249  clindamycin (CLEOCIN) 900 MG/50ML IVPB    Note to Pharmacy:  Mirian Moarver, [place]   : cabinet override      07/30/18 2249 07/30/18 2322   07/30/18 2215   clindamycin (CLEOCIN) 900 mg, gentamicin (GARAMYCIN) 240 mg in sodium chloride 0.9 % 1,000 mL for intraperitoneal lavage  Status:  Discontinued    Note to Pharmacy:  Have in the  OR room for final irrigation in bowel surgery case to minimize risk of abscess/infection Pharmacy may adjust dosing strength, schedule, rate of infusion, etc as needed to optimize therapy    Irrigation To Surgery 07/30/18 2208 07/31/18 1024   07/30/18 2130  ceFEPIme (MAXIPIME) 2 g in sodium chloride 0.9 % 100 mL IVPB     2 g 200 mL/hr over 30 Minutes Intravenous  Once 07/30/18 2129 07/30/18 2232   07/30/18 2130  metroNIDAZOLE (FLAGYL) IVPB 500 mg     500 mg 100 mL/hr over 60 Minutes Intravenous  Once 07/30/18 2129 07/30/18 2306        Note: Portions of this report may have been transcribed using voice recognition software. Every effort was made to ensure accuracy; however, inadvertent computerized transcription errors may be present.   Any transcriptional errors that result from this process are unintentional.     Ardeth SportsmanSteven C. Gross, MD, FACS, MASCRS Gastrointestinal and Minimally Invasive Surgery    [address] [place], [place] [id number] [phone] Main / Paging [phone] Fax

## 2018-08-05 NOTE — Plan of Care (Signed)

## 2018-08-05 NOTE — Care Management Important Message (Signed)
Important Message  Patient Details IM Letter given to Agustina Caroli to present to the Patient Name: Natasha Frederick MRN: [id number] Date of Birth: 10/23/1955   Medicare Important Message Given:  Yes    Caren Macadam 08/05/2018, 10:35 AM

## 2018-08-06 ENCOUNTER — Encounter (HOSPITAL_COMMUNITY): Payer: Self-pay | Admitting: Radiology

## 2018-08-06 ENCOUNTER — Inpatient Hospital Stay (HOSPITAL_COMMUNITY): Payer: Medicare Other | Admitting: Anesthesiology

## 2018-08-06 ENCOUNTER — Inpatient Hospital Stay (HOSPITAL_COMMUNITY): Payer: Medicare Other

## 2018-08-06 ENCOUNTER — Encounter (HOSPITAL_COMMUNITY): Admission: EM | Disposition: A | Discharge status: Skilled Nursing Facility | Payer: Self-pay | Source: Home / Self Care

## 2018-08-06 DIAGNOSIS — M79604 Pain in right leg: Secondary | ICD-10-CM

## 2018-08-06 DIAGNOSIS — R0989 Other specified symptoms and signs involving the circulatory and respiratory systems: Secondary | ICD-10-CM

## 2018-08-06 DIAGNOSIS — R209 Unspecified disturbances of skin sensation: Secondary | ICD-10-CM

## 2018-08-06 DIAGNOSIS — I743 Embolism and thrombosis of arteries of the lower extremities: Secondary | ICD-10-CM

## 2018-08-06 DIAGNOSIS — M79605 Pain in left leg: Secondary | ICD-10-CM

## 2018-08-06 DIAGNOSIS — K259 Gastric ulcer, unspecified as acute or chronic, without hemorrhage or perforation: Secondary | ICD-10-CM | POA: Diagnosis present

## 2018-08-06 HISTORY — PX: FEMORAL-POPLITEAL BYPASS GRAFT: SHX937

## 2018-08-06 LAB — SURGICAL PCR SCREEN
MRSA, PCR: NEGATIVE
Staphylococcus aureus: NEGATIVE

## 2018-08-06 SURGERY — BYPASS GRAFT FEMORAL-POPLITEAL ARTERY
Anesthesia: General | Site: Leg Lower | Laterality: Bilateral

## 2018-08-06 MED ORDER — LIDOCAINE 2% (20 MG/ML) 5 ML SYRINGE
INTRAMUSCULAR | Status: DC | PRN
  Administered 2018-08-06: 60 mg via INTRAVENOUS

## 2018-08-06 MED ORDER — VANCOMYCIN HCL 1000 MG IV SOLR
INTRAVENOUS | Status: DC | PRN
  Administered 2018-08-06: 13:00:00 1000 mg via INTRAVENOUS
  Administered 2018-08-06: 8 mg via INTRAVENOUS

## 2018-08-06 MED ORDER — EPHEDRINE SULFATE 50 MG/ML IJ SOLN
INTRAMUSCULAR | Status: DC | PRN
  Administered 2018-08-06: 5 mg via INTRAVENOUS

## 2018-08-06 MED ORDER — SODIUM CHLORIDE 0.9 % IV SOLN
INTRAVENOUS | Status: AC
  Filled 2018-08-06: qty 1.2

## 2018-08-06 MED ORDER — LORAZEPAM 2 MG/ML IJ SOLN
INTRAMUSCULAR | Status: AC
  Administered 2018-08-06: 1 mg
  Filled 2018-08-06: qty 1

## 2018-08-06 MED ORDER — SODIUM CHLORIDE (PF) 0.9 % IJ SOLN
INTRAMUSCULAR | Status: AC
  Filled 2018-08-06: qty 50

## 2018-08-06 MED ORDER — HYDROMORPHONE HCL 1 MG/ML IJ SOLN
0.5000 mg | INTRAMUSCULAR | Status: DC | PRN
  Administered 2018-08-09 – 2018-08-10 (×2): 1 mg via INTRAVENOUS
  Filled 2018-08-06 (×2): qty 1

## 2018-08-06 MED ORDER — FENTANYL CITRATE (PF) 100 MCG/2ML IJ SOLN
INTRAMUSCULAR | Status: AC
  Filled 2018-08-06: qty 2

## 2018-08-06 MED ORDER — OXYCODONE-ACETAMINOPHEN 5-325 MG PO TABS
1.0000 | ORAL_TABLET | ORAL | Status: DC | PRN
  Administered 2018-08-07 – 2018-08-11 (×12): 2 via ORAL
  Administered 2018-08-12: 13:00:00 1 via ORAL
  Administered 2018-08-12 (×3): 2 via ORAL
  Filled 2018-08-06 (×3): qty 2
  Filled 2018-08-06: qty 1
  Filled 2018-08-06 (×14): qty 2

## 2018-08-06 MED ORDER — SUCCINYLCHOLINE CHLORIDE 20 MG/ML IJ SOLN
INTRAMUSCULAR | Status: DC | PRN
  Administered 2018-08-06: 100 mg via INTRAVENOUS

## 2018-08-06 MED ORDER — SODIUM CHLORIDE 0.9 % IV SOLN
INTRAVENOUS | Status: DC | PRN
  Administered 2018-08-06: 14:00:00 50 ug/min via INTRAVENOUS

## 2018-08-06 MED ORDER — FENTANYL CITRATE (PF) 100 MCG/2ML IJ SOLN
INTRAMUSCULAR | Status: DC | PRN
  Administered 2018-08-06 (×2): 50 ug via INTRAVENOUS
  Administered 2018-08-06: 25 ug via INTRAVENOUS
  Administered 2018-08-06 (×2): 50 ug via INTRAVENOUS
  Administered 2018-08-06: 25 ug via INTRAVENOUS

## 2018-08-06 MED ORDER — SUGAMMADEX SODIUM 200 MG/2ML IV SOLN
INTRAVENOUS | Status: DC | PRN
  Administered 2018-08-06: 400 mg via INTRAVENOUS

## 2018-08-06 MED ORDER — DEXAMETHASONE SODIUM PHOSPHATE 10 MG/ML IJ SOLN
INTRAMUSCULAR | Status: DC | PRN
  Administered 2018-08-06: 5 mg via INTRAVENOUS

## 2018-08-06 MED ORDER — SODIUM CHLORIDE 0.9 % IV SOLN
INTRAVENOUS | Status: DC | PRN
  Administered 2018-08-06: 15:00:00

## 2018-08-06 MED ORDER — LACTATED RINGERS IV BOLUS: 1000.0000 mL | Freq: Three times a day (TID) | INTRAVENOUS | Status: AC | PRN

## 2018-08-06 MED ORDER — MUPIROCIN 2 % EX OINT
TOPICAL_OINTMENT | CUTANEOUS | Status: AC
  Administered 2018-08-06: 13:00:00
  Filled 2018-08-06: qty 22

## 2018-08-06 MED ORDER — ROCURONIUM BROMIDE 50 MG/5ML IV SOSY
PREFILLED_SYRINGE | INTRAVENOUS | Status: DC | PRN
  Administered 2018-08-06: 40 mg via INTRAVENOUS
  Administered 2018-08-06: 10 mg via INTRAVENOUS
  Administered 2018-08-06: 20 mg via INTRAVENOUS

## 2018-08-06 MED ORDER — IOHEXOL 350 MG/ML SOLN
100.0000 mL | Freq: Once | INTRAVENOUS | Status: AC | PRN
  Administered 2018-08-06: 100 mL via INTRAVENOUS

## 2018-08-06 MED ORDER — HEPARIN SODIUM (PORCINE) 1000 UNIT/ML IJ SOLN
INTRAMUSCULAR | Status: DC | PRN
  Administered 2018-08-06: 4000 [IU] via INTRAVENOUS
  Administered 2018-08-06: 7000 [IU] via INTRAVENOUS

## 2018-08-06 MED ORDER — 0.9 % SODIUM CHLORIDE (POUR BTL) OPTIME
TOPICAL | Status: DC | PRN
  Administered 2018-08-06: 1000 mL

## 2018-08-06 MED ORDER — LACTATED RINGERS IV SOLN
INTRAVENOUS | Status: DC
  Administered 2018-08-06 (×2): via INTRAVENOUS

## 2018-08-06 MED ORDER — LACTATED RINGERS IV SOLN
INTRAVENOUS | Status: DC | PRN
  Administered 2018-08-06 (×2): via INTRAVENOUS

## 2018-08-06 MED ORDER — FENTANYL CITRATE (PF) 100 MCG/2ML IJ SOLN
25.0000 ug | INTRAMUSCULAR | Status: DC | PRN
  Administered 2018-08-06: 25 ug via INTRAVENOUS

## 2018-08-06 MED ORDER — MIDAZOLAM HCL 5 MG/5ML IJ SOLN
INTRAMUSCULAR | Status: DC | PRN
  Administered 2018-08-06: 2 mg via INTRAVENOUS

## 2018-08-06 MED ORDER — PROPOFOL 10 MG/ML IV BOLUS
INTRAVENOUS | Status: DC | PRN
  Administered 2018-08-06: 150 mg via INTRAVENOUS

## 2018-08-06 SURGICAL SUPPLY — 62 items
ADH SKN CLS APL DERMABOND .7 (GAUZE/BANDAGES/DRESSINGS) ×1
BAG ISL DRAPE 18X18 STRL (DRAPES) ×1
BAG ISOLATION DRAPE 18X18 (DRAPES) IMPLANT
BANDAGE ELASTIC 4 VELCRO ST LF (GAUZE/BANDAGES/DRESSINGS) ×2 IMPLANT
BANDAGE ESMARK 6X9 LF (GAUZE/BANDAGES/DRESSINGS) IMPLANT
BNDG CMPR 9X6 STRL LF SNTH (GAUZE/BANDAGES/DRESSINGS)
BNDG ESMARK 6X9 LF (GAUZE/BANDAGES/DRESSINGS)
BNDG GAUZE ELAST 4 BULKY (GAUZE/BANDAGES/DRESSINGS) ×4 IMPLANT
CANISTER SUCT 3000ML PPV (MISCELLANEOUS) ×2 IMPLANT
CANNULA VESSEL 3MM 2 BLNT TIP (CANNULA) ×4 IMPLANT
CATH EMB 3FR 80CM (CATHETERS) ×2 IMPLANT
CATH EMB 4FR 80CM (CATHETERS) ×2 IMPLANT
CATH FOLEY 2WAY SLVR  5CC 16FR (CATHETERS) ×1
CATH FOLEY 2WAY SLVR 5CC 16FR (CATHETERS) IMPLANT
CLIP LIGATING EXTRA MED SLVR (CLIP) ×2 IMPLANT
CLIP LIGATING EXTRA SM BLUE (MISCELLANEOUS) ×2 IMPLANT
CLIP VESOCCLUDE MED 24/CT (CLIP) ×1 IMPLANT
CLIP VESOCCLUDE SM WIDE 24/CT (CLIP) ×1 IMPLANT
CONT SPEC 4OZ CLIKSEAL STRL BL (MISCELLANEOUS) ×2 IMPLANT
COVER SURGICAL LIGHT HANDLE (MISCELLANEOUS) ×1 IMPLANT
COVER WAND RF STERILE (DRAPES) ×2 IMPLANT
CUFF TOURNIQUET SINGLE 34IN LL (TOURNIQUET CUFF) IMPLANT
CUFF TOURNIQUET SINGLE 44IN (TOURNIQUET CUFF) IMPLANT
DERMABOND ADVANCED (GAUZE/BANDAGES/DRESSINGS) ×1
DERMABOND ADVANCED .7 DNX12 (GAUZE/BANDAGES/DRESSINGS) ×1 IMPLANT
DRAIN SNY 10X20 3/4 PERF (WOUND CARE) IMPLANT
DRAPE HALF SHEET 40X57 (DRAPES) IMPLANT
DRAPE ISOLATION BAG 18X18 (DRAPES) ×1
DRAPE X-RAY CASS 24X20 (DRAPES) IMPLANT
DRSG COVADERM 4X6 (GAUZE/BANDAGES/DRESSINGS) ×2 IMPLANT
DRSG COVADERM 4X8 (GAUZE/BANDAGES/DRESSINGS) ×2 IMPLANT
ELECT CAUTERY BLADE 6.4 (BLADE) ×1 IMPLANT
ELECT REM PT RETURN 9FT ADLT (ELECTROSURGICAL) ×4
ELECTRODE REM PT RTRN 9FT ADLT (ELECTROSURGICAL) ×1 IMPLANT
EVACUATOR SILICONE 100CC (DRAIN) IMPLANT
GLOVE SS BIOGEL STRL SZ 7.5 (GLOVE) ×1 IMPLANT
GLOVE SUPERSENSE BIOGEL SZ 7.5 (GLOVE) ×1
GOWN STRL REUS W/ TWL LRG LVL3 (GOWN DISPOSABLE) ×3 IMPLANT
GOWN STRL REUS W/TWL LRG LVL3 (GOWN DISPOSABLE) ×6
INSERT FOGARTY SM (MISCELLANEOUS) IMPLANT
KIT BASIN OR (CUSTOM PROCEDURE TRAY) ×2 IMPLANT
KIT TURNOVER KIT B (KITS) ×2 IMPLANT
NS IRRIG 1000ML POUR BTL (IV SOLUTION) ×4 IMPLANT
PACK PERIPHERAL VASCULAR (CUSTOM PROCEDURE TRAY) ×2 IMPLANT
PAD ARMBOARD 7.5X6 YLW CONV (MISCELLANEOUS) ×4 IMPLANT
PADDING CAST COTTON 6X4 STRL (CAST SUPPLIES) IMPLANT
PENCIL BUTTON HOLSTER BLD 10FT (ELECTRODE) ×1 IMPLANT
SET COLLECT BLD 21X3/4 12 (NEEDLE) IMPLANT
STOPCOCK 4 WAY LG BORE MALE ST (IV SETS) ×1 IMPLANT
SUT ETHILON 3 0 PS 1 (SUTURE) ×1 IMPLANT
SUT PROLENE 5 0 C 1 24 (SUTURE) ×2 IMPLANT
SUT PROLENE 6 0 CC (SUTURE) ×5 IMPLANT
SUT SILK 2 0 SH (SUTURE) ×2 IMPLANT
SUT VIC AB 2-0 CTX 36 (SUTURE) ×3 IMPLANT
SUT VIC AB 3-0 SH 27 (SUTURE) ×4
SUT VIC AB 3-0 SH 27X BRD (SUTURE) ×2 IMPLANT
SYR 3ML LL SCALE MARK (SYRINGE) ×2 IMPLANT
TOWEL GREEN STERILE (TOWEL DISPOSABLE) ×3 IMPLANT
TRAY FOLEY MTR SLVR 16FR STAT (SET/KITS/TRAYS/PACK) ×2 IMPLANT
TUBING EXTENTION W/L.L. (IV SETS) IMPLANT
UNDERPAD 30X30 (UNDERPADS AND DIAPERS) ×2 IMPLANT
WATER STERILE IRR 1000ML POUR (IV SOLUTION) ×2 IMPLANT

## 2018-08-06 NOTE — Progress Notes (Signed)
Note: Portions of this report may have been transcribed using voice recognition software. Every effort was made to ensure accuracy; however, inadvertent computerized transcription errors may be present.   Any transcriptional errors that result from this process are unintentional.              Natasha Frederick  1955-11-04 [id number]  Patient Care Team: Kristian Covey, MD as PCP - General (Family Medicine) Aliene Beams, MD as Referring Physician (Neurosurgery) Tat, Octaviano Batty, DO as Consulting Physician (Neurology) Karie Soda, MD as Consulting Physician (General Surgery) Wynema Birch, MD as Consulting Physician (Neurology)  Dopplers done with no signal in right foot and decreased ABI on left foot.  CT angiogram ordered showing evidence of occlusion on both lower extremities.  Concern for possible initial thrombus but no longer significant there.  Left side not too bad until the popliteals.  Right side more concerning.  Reviewed and discussed with Dr. early with vascular surgery.  Plan is to transfer patient to [facility] emergently for emergent cutdown and thromboembolectomy and possible bypass in the hopes of salvaging her BLE blood supply.  I called and discussed with the patient's husband at his home phone.  I was able to reach him.  Updated on overall neurological and abdominal/ulcer situation.  Updated on unfortunate new finding today.  He expressed appreciation for care and agrees with being aggressive to do emergency surgery on her occluded blood supply to her legs.  Questions answered.  He agrees with proceeding with emergency surgery on his wife.  Our CCS PA, Mr. Marlyne Beards, able to update the patient at the bedside as well.  Explained reasoning for surgery.  She consents and agrees.  [facility] called.  They are working to transfer now.  Patient Active Problem List   Diagnosis Date Noted  . Perforated gastric ulcer s/p omental patch 07/30/2018 07/31/2018    Priority: High  .  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 Pneumoperitoneum 07/30/2018    Priority: Medium  . Delirium 08/01/2018  . Bile peritonitis (HCC) 07/31/2018  . Long term current use of non-steroidal anti-inflammatories (NSAID) 07/30/2018  . ARF (acute renal failure) (HCC) 07/30/2018  . Primary osteoarthritis of left hip 07/19/2014  . Scoliosis of lumbar spine 01/05/2014    Past Medical History:  Diagnosis Date  . Arthritis    hip  . Endometriosis   . Scoliosis     Past Surgical History:  Procedure Laterality Date  . ANTERIOR LAT LUMBAR FUSION Left 01/05/2014   Procedure: LUMBAR TWO TO THREE, LUMBAR LUMBAR THREE TO FOUR, ANTERIOR LATERAL LUMBAR FUSION 2 LEVELS;  Surgeon: Reinaldo Meeker, MD;  Location: [facility];  Service: Neurosurgery;  Laterality: Left;  . APPENDECTOMY  1963  . BACK SURGERY    . BREAST SURGERY Left 1987   bx  . COLONOSCOPY W/ POLYPECTOMY    . EYE SURGERY    . LAPAROSCOPY N/A 07/30/2018   Procedure: DIAGNOSTIC LAPAROSCOPY, Omentopexy gram patch, Washout of intra-abdominal abcesses x 3;  Surgeon: Karie Soda, MD;  Location: [facility];  Service: General;  Laterality: N/A;  . OOPHORECTOMY  1977   left  . Planters wart Left 2005  . radial keratoto Bilateral   . TONSILLECTOMY  1958  . TOTAL HIP ARTHROPLASTY Left 07/19/2014   Procedure: TOTAL HIP ARTHROPLASTY;  Surgeon: Gean Birchwood, MD;  Location: [facility] OR;  Service: Orthopedics;  Laterality: Left;  . TUBAL LIGATION  1986    Social History  Socioeconomic History  . Marital status: Married    Spouse name: Not on file  . Number of children: Not on file  . Years of education: Not on file  . Highest education level: Not on file  Occupational History  . Occupation: unemployed    Comment: disabled back pain  Social Needs  . Financial resource strain: Not on file  . Food insecurity:    Worry: Not on file    Inability: Not on file  . Transportation needs:    Medical: Not on file    Non-medical: Not  on file  Tobacco Use  . Smoking status: Former Smoker    Packs/day: 1.00    Years: 8.00    Pack years: 8.00    Types: Cigarettes    Last attempt to quit: 01/17/1994    Years since quitting: 24.5  . Smokeless tobacco: Never Used  Substance and Sexual Activity  . Alcohol use: Yes    Alcohol/week: 3.0 standard drinks    Types: 3 Cans of beer per week    Comment: per week  . Drug use: No  . Sexual activity: Not on file  Lifestyle  . Physical activity:    Days per week: Not on file    Minutes per session: Not on file  . Stress: Not on file  Relationships  . Social connections:    Talks on phone: Not on file    Gets together: Not on file    Attends religious service: Not on file    Active member of club or organization: Not on file    Attends meetings of clubs or organizations: Not on file    Relationship status: Not on file  . Intimate partner violence:    Fear of current or ex partner: Not on file    Emotionally abused: Not on file    Physically abused: Not on file    Forced sexual activity: Not on file  Other Topics Concern  . Not on file  Social History Narrative   ** Merged History Encounter **        Family History  Problem Relation Age of Onset  . Lung cancer Mother   . Stroke Father   . Huntington's disease Paternal Grandmother   . Huntington's disease Paternal Aunt   . Huntington's disease Paternal Uncle   . Arthritis Neg Hx        family hx  . Cancer Neg Hx        prostate ca -family hx  . Heart disease Neg Hx        family hx    Current Facility-Administered Medications  Medication Dose Route Frequency Provider Last Rate Last Dose  . acetaminophen (TYLENOL) tablet 500 mg  500 mg Oral TID WC & HS Karie SodaGross, , MD   500 mg at 08/06/18 0757  . bisacodyl (DULCOLAX) suppository 10 mg  10 mg Rectal Daily PRN Karie SodaGross, , MD      . Chlorhexidine Gluconate Cloth 2 % PADS 6 each  6 each Topical Once Karie SodaGross, , MD      . Chlorhexidine Gluconate Cloth 2  % PADS 6 each  6 each Topical Daily Karie SodaGross, , MD   6 each at 08/06/18 0800  . Deutetrabenazine TABS 24 mg  24 mg Oral BID Karie SodaGross, , MD   24 mg at 08/05/18 2123  . enoxaparin (LOVENOX) injection 40 mg  40 mg Subcutaneous Q24H Karie SodaGross, , MD   40 mg at 08/06/18 0757  . feeding  supplement (ENSURE SURGERY) liquid 237 mL  237 mL Oral BID BM Karie Soda, MD   237 mL at 08/05/18 1532  . hydrALAZINE (APRESOLINE) injection 5-20 mg  5-20 mg Intravenous Q4H PRN Karie Soda, MD      . HYDROmorphone (DILAUDID) injection 0.5-1 mg  0.5-1 mg Intravenous Q4H PRN Karie Soda, MD   1 mg at 08/05/18 2022  . lactated ringers bolus 1,000 mL  1,000 mL Intravenous Q8H PRN Karie Soda, MD      . lip balm (CARMEX) ointment 1 application  1 application Topical BID Karie Soda, MD   1 application at 08/05/18 2124  . LORazepam (ATIVAN) injection 1-2 mg  1-2 mg Intravenous Q6H PRN Karie Soda, MD   2 mg at 08/04/18 2146  . magic mouthwash  15 mL Oral QID PRN Karie Soda, MD      . MEDLINE mouth rinse  15 mL Mouth Rinse BID Karie Soda, MD   15 mL at 08/05/18 2124  . methocarbamol (ROBAXIN) 500 mg in dextrose 5 % 50 mL IVPB  500 mg Intravenous Q6H PRN Karie Soda, MD   Stopped at 08/03/18 1314  . metoprolol tartrate (LOPRESSOR) injection 5 mg  5 mg Intravenous Q6H PRN Karie Soda, MD      . ondansetron [facility]) injection 4 mg  4 mg Intravenous Q8H PRN Karie Soda, MD   4 mg at 08/04/18 1503  . pantoprazole (PROTONIX) EC tablet 40 mg  40 mg Oral BID Karie Soda, MD   40 mg at 08/05/18 2123  . polyethylene glycol (MIRALAX / GLYCOLAX) packet 17 g  17 g Oral BID Karie Soda, MD   17 g at 08/05/18 2123  . sodium chloride (PF) 0.9 % injection           . sodium chloride flush (NS) 0.9 % injection 10-40 mL  10-40 mL Intracatheter Catha Gosselin, MD   10 mL at 08/05/18 0951  . sodium chloride flush (NS) 0.9 % injection 10-40 mL  10-40 mL Intracatheter PRN Karie Soda, MD         Allergies   Allergen Reactions  . Bee Venom Anaphylaxis and Swelling  . Nsaids Other (See Comments)    PERFORATED ULCER = NO MORE NSAIDs  . Adhesive [Tape] Other (See Comments)    redness  . Codeine   . Penicillins   . Sulfa Antibiotics     BP 128/76 (BP Location: Right Arm)   Pulse 89   Temp 98.7 F (37.1 C) (Oral)   Resp 20   Ht  (1.626 m)   Wt 64.2 kg   SpO2 94%   BMI 24.29 kg/m   X-ray Abdomen Ap  Result Date: 07/31/2018 CLINICAL DATA:  NG tube placement EXAM: ABDOMEN - 1 VIEW COMPARISON:  None. FINDINGS: NG tube tip is in the mid stomach. Nonobstructive bowel gas pattern. Surgical drain noted within the abdomen. IMPRESSION: NG tube tip in the mid stomach. Electronically Signed   By: Charlett Nose M.D.   On: 07/31/2018 01:35   Ct Abdomen Pelvis W Contrast  Result Date: 07/30/2018 CLINICAL DATA:  Generalized abdominal pain, left lower quadrant pain. EXAM: CT ABDOMEN AND PELVIS WITH CONTRAST TECHNIQUE: Multidetector CT imaging of the abdomen and pelvis was performed using the standard protocol following bolus administration of intravenous contrast. CONTRAST:  OMNIPAQUE IOHEXOL 300 MG/ML  SOLN COMPARISON:  None FINDINGS: Lower chest: Small bilateral pleural effusions. Bibasilar atelectasis. Heart is normal size. Hepatobiliary: No focal hepatic abnormality. Gallbladder  unremarkable. Pancreas: No focal abnormality or ductal dilatation. Spleen: No focal abnormality.  Normal size. Adrenals/Urinary Tract: Wedge-shaped low-density and area of decreased enhancement in the midpole of the right kidney could reflect renal infarct. No hydronephrosis. Adrenal glands and left kidney unremarkable. Urinary bladder grossly unremarkable. Stomach/Bowel: Gastric wall appears thickened in the distal stomach region. Small bowel is decompressed and grossly unremarkable. Left colonic diverticulosis. No active diverticulitis. Moderate stool burden in the colon. Vascular/Lymphatic: Aortic atherosclerosis. No  enlarged abdominal or pelvic lymph nodes. Reproductive: No visible pelvic masses. Beam hardening artifact from the left hip replacement obscures lower pelvic structures. Other: Large volume ascites and large amount of pneumoperitoneum noted in the abdomen. Exact source is difficult to determine. The mid to distal gastric wall does appear thickened possibly related to gastritis on coronal image 47, there is gas collection noted which appears to be along the lesser curvature of the stomach, possibly within the wall, possibly representing large ulceration. Musculoskeletal: Postoperative fusion changes in the lumbar spine. Diffuse degenerative disc disease changes. No acute bony abnormality. Prior left hip replacement. IMPRESSION: Large volume pneumoperitoneum and ascites in the abdomen. Exact source is difficult to determine, but favor gastric. The mid to distal gastric wall appears thickened and there may be large ulceration along the lesser curvature. Left colonic diverticulosis.  No active diverticulitis. Aortic atherosclerosis. Wedge-shaped low-density area in the midpole of the right kidney may reflect renal infarct. Small bilateral pleural effusions, bibasilar atelectasis. These results were called by telephone at the time of interpretation on 07/30/2018 at 9:16 pm to [facility] , who verbally acknowledged these results. Electronically Signed   By: Charlett Nose M.D.   On: 07/30/2018 21:18   Dg Chest Port 1 View  Result Date: 07/31/2018 CLINICAL DATA:  NG tube placement.  Postop EXAM: PORTABLE CHEST 1 VIEW COMPARISON:  07/09/2014 FINDINGS: NG tube is in the stomach. Low lung volumes with vascular congestion and bibasilar atelectasis. Possible small effusions. No acute bony abnormality. IMPRESSION: Low lung volumes.  Vascular congestion with bibasilar atelectasis. Suspect small effusions. NG tube in the stomach. Electronically Signed   By: Charlett Nose M.D.   On: 07/31/2018 01:33   Dg Abd Portable  1v  Result Date: 07/31/2018 CLINICAL DATA:  Nasogastric tube placement. EXAM: PORTABLE ABDOMEN - 1 VIEW COMPARISON:  Radiograph of same day. FINDINGS: The bowel gas pattern is normal. Distal tip of nasogastric tube remains in the distal esophagus just above the gastroesophageal junction. Advancement is recommended. Surgical drain is unchanged in position. No radio-opaque calculi or other significant radiographic abnormality are seen. IMPRESSION: Nasogastric tube tip is seen at or just above the gastroesophageal junction. It is not significantly changed in position compared to prior exam. Advancement is recommended. Electronically Signed   By: Lupita Raider M.D.   On: 07/31/2018 12:56   Dg Abd Portable 1v  Result Date: 07/31/2018 CLINICAL DATA:  NG tube placement EXAM: PORTABLE ABDOMEN - 1 VIEW COMPARISON:  None. FINDINGS: Nasogastric tube with the tip projecting over esophagogastric junction. Recommend advancing the tube 14 cm. There is no bowel dilatation to suggest obstruction. There is no evidence of pneumoperitoneum, portal venous gas or pneumatosis. There are no pathologic calcifications along the expected course of the ureters. The osseous structures are unremarkable. IMPRESSION: Nasogastric tube with the tip projecting over esophagogastric junction. Recommend advancing the tube 14 cm. Electronically Signed   By: Elige Ko   On: 07/31/2018 11:00   Dg Abd Portable 1v  Result Date: 07/31/2018 CLINICAL  DATA:  Nasogastric tube placement. EXAM: PORTABLE ABDOMEN - 1 VIEW COMPARISON:  Radiograph of same day. FINDINGS: The bowel gas pattern is normal. No radio-opaque calculi or other significant radiographic abnormality are seen. Nasogastric tube is not visualized currently on this study. Postsurgical changes are noted in the lower lumbar spine and left hip. IMPRESSION: Nasogastric tube is not visualized on this study. No evidence of bowel obstruction or ileus. Electronically Signed   By: Lupita Raider M.D.   On: 07/31/2018 09:35   Dg Kayleen Memos W Single Cm (sol Or Thin Ba)  Result Date: 08/02/2018 CLINICAL DATA:  Status post omental patch for perforated gastric ulcer. EXAM: WATER SOLUBLE UPPER GI SERIES TECHNIQUE: Single-column upper GI series was performed using water soluble contrast. CONTRAST:  Omnipaque 180 COMPARISON:  None. FLUOROSCOPY TIME:  Fluoroscopy Time:  2 minutes and 48 seconds. Radiation Exposure Index (if provided by the fluoroscopic device): 31.3 mGy. Number of Acquired Spot Images: 0 FINDINGS: Initially, the NG tube was noted in the distal esophagus. It was advanced into the fundus of the stomach. Contrast injected fills the stomach as well as the duodenum. There is a focal outpouching of contrast at the lesser curvature of the antrum of the stomach. It measures 1.1 cm in maximal diameter. There is a wide neck allowing passage of contrast into this outpouching. There is no  spillage of contrast into the peritoneal space. The stomach is otherwise within normal limits without intraluminal polypoid filling defect or stricture. The duodenum is also unremarkable. IMPRESSION: The study demonstrates a focal outpouching of contrast at the lesser curvature of the antrum, at the presumed location of the omental patch. There is no  spillage of contrast into the peritoneal space. This may be the expected appearance of an omental patch over a gastric wall defect however a recurrent perforation is not excluded. Findings were discussed with Dr. Gaynelle Adu. Electronically Signed   By: Jolaine Click M.D.   On: 08/02/2018 13:00   Vas [place] Vanice Sarah With/wo Tbi  Result Date: 08/06/2018 LOWER EXTREMITY DOPPLER STUDY Indications: Rest pain.  Performing Technologist: Blanch Media RVS  Examination Guidelines: A complete evaluation includes at minimum, Doppler waveform signals and systolic blood pressure reading at the level of bilateral brachial, anterior tibial, and posterior tibial arteries, when vessel  segments are accessible. Bilateral testing is considered an integral part of a complete examination. Photoelectric Plethysmograph (PPG) waveforms and toe systolic pressure readings are included as required and additional duplex testing as needed. Limited examinations for reoccurring indications may be performed as noted.  ABI Findings: +--------+------------------+-----+---------+--------+ Right   Rt Pressure (mmHg)IndexWaveform Comment  +--------+------------------+-----+---------+--------+ [id number]                    triphasic         +--------+------------------+-----+---------+--------+ PTA                            absent            +--------+------------------+-----+---------+--------+ DP                             absent            +--------+------------------+-----+---------+--------+ +--------+------------------+-----+-------------------+------------------------+ Left    Lt Pressure (mmHg)IndexWaveform           Comment                  +--------+------------------+-----+-------------------+------------------------+  Brachial                                          unable to obtain due to                                                    line placement           +--------+------------------+-----+-------------------+------------------------+ PTA     55                0.41 dampened monophasic                         +--------+------------------+-----+-------------------+------------------------+ DP                             absent                                      +--------+------------------+-----+-------------------+------------------------+ +-------+-----------+-----------+------------+------------+ ABI/TBIToday's ABIToday's TBIPrevious ABIPrevious TBI +-------+-----------+-----------+------------+------------+ Right                                                 +-------+-----------+-----------+------------+------------+ Left    0.41                                           +-------+-----------+-----------+------------+------------+  Summary: Left: Resting left ankle-brachial index indicates severe left lower extremity arterial disease.  *See table(s) above for measurements and observations.    Preliminary

## 2018-08-06 NOTE — Progress Notes (Signed)
PT Cancellation Note  Patient Details Name: Lonita Albers MRN: [id number] DOB: [birth year]   Cancelled Treatment:    Reason Eval/Treat Not Completed: Medical issues which prohibited therapy;Patient at procedure or test/unavailable  Per chart - transferred to [facility] for surgery. Will continue to follow.     Kipp Laurence, PT, DPT Supplemental Physical Therapist 08/06/18 1:01 PM Pager: [phone] Office: [phone]

## 2018-08-06 NOTE — Progress Notes (Signed)
ABI has been completed.   Preliminary results in CV Proc.   Blanch Media 08/06/2018 9:58 AM

## 2018-08-06 NOTE — Anesthesia Preprocedure Evaluation (Addendum)
Anesthesia Evaluation  Patient identified by MRN, date of birth, ID band Patient awake    Reviewed: Allergy & Precautions, NPO status , Patient's Chart, lab work & pertinent test results  Airway Mallampati: II  TM Distance: >3 FB Neck ROM: full    Dental  (+) Dental Advidsory Given   Pulmonary former smoker,    breath sounds clear to auscultation       Cardiovascular negative cardio ROS   Rhythm:regular     Neuro/Psych    GI/Hepatic Neg liver ROS, PUD,   Endo/Other    Renal/GU Renal disease     Musculoskeletal  (+) Arthritis ,   Abdominal   Peds  Hematology   Anesthesia Other Findings   Reproductive/Obstetrics                            Anesthesia Physical Anesthesia Plan  ASA: III  Anesthesia Plan: General   Post-op Pain Management:    Induction: Intravenous  PONV Risk Score and Plan: Ondansetron, Dexamethasone and Midazolam  Airway Management Planned: Oral ETT  Additional Equipment:   Intra-op Plan:   Post-operative Plan: Possible Post-op intubation/ventilation  Informed Consent: I have reviewed the patients History and Physical, chart, labs and discussed the procedure including the risks, benefits and alternatives for the proposed anesthesia with the patient or authorized representative who has indicated his/her understanding and acceptance.     Dental Advisory Given and Dental advisory given  Plan Discussed with: CRNA and Anesthesiologist  Anesthesia Plan Comments:       Anesthesia Quick Evaluation

## 2018-08-06 NOTE — Consult Note (Signed)
[facility] [place]  Patient name: Natasha Frederick MRN: [id number] DOB: 01/03/1956 Sex: female    HPI: Natasha Frederick is a 63 y.o. female seen for bilateral lower extremity ischemia.  She has very complicated history.  The patient reports that 1 week ago she "overdid it" and aerobics.  She began having discomfort in both lower extremities.  This persisted.  She was also having abdominal pain and diarrhea.  She reports that she presented to [facility] emergency room complaining of lower extremity discomfort.  She was found to have a perforated gastric ulcer and underwent emergent surgery with a [name] patch.  She has had slow recovery from this.  This morning she was noted to have more profound ischemia of both lower extremities and we were contacted.  She had no Doppler flow in her feet.  In reviewing her CT scan on 07/30/2018, she did have a eccentric thrombus in her infrarenal aorta which could be a source for embolus.  She underwent CT scan showing occlusion of the floater both lower extremities and was transferred emergently to [facility].  She does have a history of Huntington's disease but reports that she is able to walk normally and was actually doing aerobic exercises up to 1 week ago.  She has bilateral foot drop on physical exam and reports that this is all new.  Past Medical History:  Diagnosis Date  . Arthritis    hip  . Endometriosis   . Scoliosis     Family History  Problem Relation Age of Onset  . Lung cancer Mother   . Stroke Father   . Huntington's disease Paternal Grandmother   . Huntington's disease Paternal Aunt   . Huntington's disease Paternal Uncle   . Arthritis Neg Hx        family hx  . Cancer Neg Hx        prostate ca -family hx  . Heart disease Neg Hx        family hx    SOCIAL HISTORY: Social History   Tobacco Use  . Smoking status: Former Smoker    Packs/day: 1.00    Years: 8.00    Pack years:  8.00    Types: Cigarettes    Last attempt to quit: 01/17/1994    Years since quitting: 24.5  . Smokeless tobacco: Never Used  Substance Use Topics  . Alcohol use: Yes    Alcohol/week: 3.0 standard drinks    Types: 3 Cans of beer per week    Comment: per week    Allergies  Allergen Reactions  . Bee Venom Anaphylaxis and Swelling  . Nsaids Other (See Comments)    PERFORATED ULCER = NO MORE NSAIDs  . Adhesive [Tape] Other (See Comments)    redness  . Codeine   . Penicillins   . Sulfa Antibiotics     Current Facility-Administered Medications  Medication Dose Route Frequency Provider Last Rate Last Dose  . [MAR Hold] acetaminophen (TYLENOL) tablet 500 mg  500 mg Oral TID WC & HS Karie SodaGross, Steven, MD   500 mg at 08/06/18 0757  . [MAR Hold] bisacodyl (DULCOLAX) suppository 10 mg  10 mg Rectal Daily PRN Karie SodaGross, Steven, MD      . Mitzi Hansen[MAR Hold] Chlorhexidine Gluconate Cloth 2 % PADS 6 each  6 each Topical Once Karie SodaGross, Steven, MD      . Mitzi Hansen[MAR Hold] Chlorhexidine Gluconate Cloth 2 % PADS 6 each  6 each Topical Daily Karie SodaGross, Steven, MD  6 each at 08/06/18 0800  . [MAR Hold] Deutetrabenazine TABS 24 mg  24 mg Oral BID Karie Soda, MD   24 mg at 08/05/18 2123  . [MAR Hold] enoxaparin (LOVENOX) injection 40 mg  40 mg Subcutaneous Q24H Karie Soda, MD   40 mg at 08/06/18 0757  . [MAR Hold] feeding supplement (ENSURE SURGERY) liquid 237 mL  237 mL Oral BID BM Karie Soda, MD   237 mL at 08/05/18 1532  . [MAR Hold] hydrALAZINE (APRESOLINE) injection 5-20 mg  5-20 mg Intravenous Q4H PRN Karie Soda, MD      . Mitzi Hansen Hold] HYDROmorphone (DILAUDID) injection 0.5-1 mg  0.5-1 mg Intravenous Q4H PRN Karie Soda, MD   1 mg at 08/05/18 2022  . [MAR Hold] lactated ringers bolus 1,000 mL  1,000 mL Intravenous Q8H PRN Karie Soda, MD      . lactated ringers infusion   Intravenous Continuous Dorris Singh, MD 10 mL/hr at 08/06/18 1246    . [MAR Hold] lip balm (CARMEX) ointment 1 application  1 application  Topical BID Karie Soda, MD   1 application at 08/05/18 2124  . [MAR Hold] LORazepam (ATIVAN) injection 1-2 mg  1-2 mg Intravenous Q6H PRN Karie Soda, MD   2 mg at 08/04/18 2146  . [MAR Hold] magic mouthwash  15 mL Oral QID PRN Karie Soda, MD      . Mitzi Hansen Hold] MEDLINE mouth rinse  15 mL Mouth Rinse BID Karie Soda, MD   15 mL at 08/05/18 2124  . [MAR Hold] methocarbamol (ROBAXIN) 500 mg in dextrose 5 % 50 mL IVPB  500 mg Intravenous Q6H PRN Karie Soda, MD   Stopped at 08/03/18 1314  . [MAR Hold] metoprolol tartrate (LOPRESSOR) injection 5 mg  5 mg Intravenous Q6H PRN Karie Soda, MD      . Mitzi Hansen Hold] ondansetron [facility]) injection 4 mg  4 mg Intravenous Q8H PRN Karie Soda, MD   4 mg at 08/04/18 1503  . [MAR Hold] pantoprazole (PROTONIX) EC tablet 40 mg  40 mg Oral BID Karie Soda, MD   40 mg at 08/05/18 2123  . [MAR Hold] polyethylene glycol (MIRALAX / GLYCOLAX) packet 17 g  17 g Oral BID Karie Soda, MD   17 g at 08/05/18 2123  . sodium chloride (PF) 0.9 % injection           . [MAR Hold] sodium chloride flush (NS) 0.9 % injection 10-40 mL  10-40 mL Intracatheter Catha Gosselin, MD   10 mL at 08/05/18 0951  . [MAR Hold] sodium chloride flush (NS) 0.9 % injection 10-40 mL  10-40 mL Intracatheter PRN Karie Soda, MD       Facility-Administered Medications Ordered in Other Encounters  Medication Dose Route Frequency Provider Last Rate Last Dose  . lactated ringers infusion    Continuous PRN Carmela Rima, CRNA        REVIEW OF SYSTEMS:  Reviewed in her history and physical with nothing to add.  No prior history of lower extremity ischemia PHYSICAL EXAM: Vitals:   08/05/18 1340 08/05/18 2200 08/06/18 0648 08/06/18 1304  BP: 126/75 117/65 128/76   Pulse: 89 86 89   Resp: Temp: 98.8 F (37.1 C) 99 F (37.2 C) 98.7 F (37.1 C)   TempSrc: Oral Oral Oral   SpO2: 98% 96% 94%   Weight:    64.2 kg  Height:     (1.626 m)    GENERAL: The  patient  is a well-nourished female, in no acute distress. The vital signs are documented above. CARDIOVASCULAR: Palpable radial pulses.  2+ femoral and 2-3+ popliteal pulses bilaterally.  Absent pedal pulses bilaterally PULMONARY: There is good air exchange  MUSCULOSKELETAL: There are no major deformities or cyanosis.  Tender to touch in her muscles on her calves distally NEUROLOGIC: No motor or sensory function in both feet.  Bilateral foot drop. SKIN: There are no ulcers or rashes noted.  Left foot cadaveric with ischemic changes around her nailbeds and on her right heel.  Also with some apparent full-thickness skin loss on her pretibial area distally PSYCHIATRIC: The patient has a normal affect.       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  In all likelihood had embolus causing her pain when she initially presented to the emergency room on 422.  Due to her emergent abdominal surgery and prolonged recovery has had persistent ischemia.  Will take immediately to the operating room for embolectomy.  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  I discussed this at length with the patient.  I explained that she is at extremely high risk for limb loss due to prolonged ischemia.  Also explained that in all likelihood will need fasciotomies due to the prolonged ischemia as well    Larina Earthly, MD [facility] Tel [phone] Pager [phone]

## 2018-08-06 NOTE — Anesthesia Procedure Notes (Signed)
Procedure Name: Intubation Date/Time: 08/06/2018 1:35 PM Performed by: Neldon Newport, CRNA Pre-anesthesia Checklist: Timeout performed, Patient being monitored, Suction available, Emergency Drugs available and Patient identified Patient Re-evaluated:Patient Re-evaluated prior to induction Oxygen Delivery Method: Circle system utilized Preoxygenation: Pre-oxygenation with 100% oxygen Induction Type: IV induction and Rapid sequence Laryngoscope Size: Mac, 3 and Glidescope Grade View: Grade III Tube type: Oral Tube size: 7.0 mm Number of attempts: 2 Placement Confirmation: breath sounds checked- equal and bilateral,  positive ETCO2 and ETT inserted through vocal cords under direct vision Secured at: 21 cm Tube secured with: Tape Dental Injury: Teeth and Oropharynx as per pre-operative assessment

## 2018-08-06 NOTE — Transfer of Care (Signed)
Immediate Anesthesia Transfer of Care Note  Patient: Natasha Frederick  Procedure(s) Performed: BILATERAL POPLITEAL AND TIBIAL EMBOLECTOMIES, LEFT LEG FASCIOTOMY (Bilateral Leg Lower)  Patient Location: PACU  Anesthesia Type:General  Level of Consciousness: awake, alert  and patient cooperative  Airway & Oxygen Therapy: Patient Spontanous Breathing and Patient connected to nasal cannula oxygen  Post-op Assessment: Report given to RN and Post -op Vital signs reviewed and stable  Post vital signs: Reviewed and stable  Last Vitals:  Vitals Value Taken Time  BP 113/93 08/06/2018  4:25 PM  Temp    Pulse 83 08/06/2018  4:30 PM  Resp 13 08/06/2018  4:30 PM  SpO2 97 % 08/06/2018  4:30 PM  Vitals shown include unvalidated device data.  Last Pain:  Vitals:   08/06/18 0815  TempSrc:   PainSc: 7          Complications: No apparent anesthesia complications

## 2018-08-06 NOTE — Anesthesia Postprocedure Evaluation (Signed)
Anesthesia Post Note  Patient: Ladeja Fortna  Procedure(s) Performed: BILATERAL POPLITEAL AND TIBIAL EMBOLECTOMIES, LEFT LEG FASCIOTOMY (Bilateral Leg Lower)     Patient location during evaluation: PACU Anesthesia Type: General Level of consciousness: awake Pain management: pain level controlled Vital Signs Assessment: post-procedure vital signs reviewed and stable Respiratory status: spontaneous breathing Cardiovascular status: stable Postop Assessment: no apparent nausea or vomiting Anesthetic complications: no    Last Vitals:  Vitals:   08/06/18 1630 08/06/18 1645  BP:  (!) 108/59  Pulse:  87  Resp:  15  Temp:    SpO2: 97% 95%    Last Pain:  Vitals:   08/06/18 0815  TempSrc:   PainSc: 7                  CHARLENE GREEN

## 2018-08-06 NOTE — Op Note (Signed)
OPERATIVE REPORT  DATE OF SURGERY: 08/06/2018  PATIENT: Natasha Frederick, 63 y.o. female MRN: [id number]  DOB: 29-Apr-1955  PRE-OPERATIVE DIAGNOSIS: Severe bilateral lower extremity ischemia  POST-OPERATIVE DIAGNOSIS:  Same  PROCEDURE: Bilateral popliteal and tibial embolectomies.  Left lateral and medial fasciotomy  SURGEON:  Gretta Began, M.D.  PHYSICIAN ASSISTANT: Dr. Durene Cal, Aggie Moats, PA-C  ANESTHESIA: General  EBL: per anesthesia record  Total I/O In: 1500 [I.V.:1500] Out: 665 [Urine:550; Drains:15; Blood:100]  BLOOD ADMINISTERED: none  DRAINS: none  SPECIMEN: none  COUNTS CORRECT:  YES  PATIENT DISPOSITION:  PACU - hemodynamically stable  PROCEDURE DETAILS: Patient was taken the operating placed supine position where the area both groins both legs were prepped and draped in usual sterile fashion.  Incision was made on the medial looks approach to the popliteal artery on the right and the gastrocnemius was reflected posteriorly.  The patient did have no pulse in the below-knee popliteal artery.  The anterior tibial and tibioperoneal trunk were also isolated.  The popliteal artery was opened transversely and there was thrombus in this level of popliteal artery.  The artery was thrombectomized with a 4 Fogarty and there was good inflow.  Next the 4 Fogarty was passed all the way into the level of the aorta due to there being some evidence of potential embolic source in the aorta.  No thrombus was removed.  The popliteal artery was reoccluded proximally.  Next 3 and 4 Fogarty catheters were passed down the anterior tibial and tibioperoneal trunk.  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  Casts of clot were removed that appeared to be quite organized.  There was no backbleeding.  Separate incision was made over the posterior tibial artery at the ankle.  The artery was of normal caliber with no evidence of atherosclerotic disease.  The artery was  opened longitudinally and was completely thrombosed.  3 Fogarty catheter was passed all the way into the foot and clot was removed with clot being all the way into the foot with no backbleeding.  This felt there was no possibility for limb salvage with profound ischemia of the foot with prolonged ischemia.  The incision in the posterior tibial artery was closed with a running 6-0 Prolene suture.  The transverse incision in the popliteal artery was closed with a running 6-0 Prolene suture.  The ankle incision was closed with a running 2-0 nylon suture.  The popliteal incision was closed with skin only in the with skin staples.  Tension was then turned to the left leg.  A incision was made on the medial approach to the below-knee popliteal artery.  There was a pulse in the below-knee popliteal artery.  The anterior tibial, posterior tibial and peroneal arteries were all individually dissected free and isolated encircled with a red vessel loop Potts ties.  The patient proximal popliteal artery was occluded and the artery was opened longitudinally with an 11 blade and Potts scissors.  4 Fogarty catheter was passed through the popliteal artery proximally into the aorta and no thrombus was removed.  A 3 and 4 Fogarty's were passed down the anterior tibial artery initially with catheter passing all the way to just above the ankle.  There was organized chronic thrombus removed with some backbleeding.  This was reoccluded.  Next the 3 and 4 Fogarty's were passed down the posterior tibial.  This was passed all the way to the ankle with some backbleeding present there as well.  Catheter would not pass down the peroneal artery at its origin.  Saphenous vein was harvested from the same incision and was spatulated and sewn as a patch angioplasty for closure of the below-knee popliteal artery with a running 6-0 Prolene suture.  Should be noted the patient was initially given 7000 units prior to opening the right artery and  additional 4000 units of heparin were given during the surgery.  The heparin was not reversed.  Due to the profound prolonged ischemia the fascia was opened medially through the same incision.  Next a separate incision was made laterally in the anterior and posterior lateral compartments were opened from the knee to just above the ankle.  The anterior compartments were extremely dusky with very poor contractility.  The skin was closed with skin staples.  The fascia was not closed.  The patient had dressings applied with splint transferred to the recovery room in stable condition  I did discuss the findings and surgery with the patient's husband by telephone.  I explained that she is certain to lose her right foot with hope ability to heal a below-knee amputation.  I also explained that she is extremely high risk for left leg amputation and would make this determination over the next 24 to 48 hours.   Larina Earthlyodd F. Malikiah Debarr, M.D., [facility] 08/06/2018 4:52 PM

## 2018-08-06 NOTE — Progress Notes (Signed)
Called CT about STAT order that was placed at 0933. CT said they would ne sending for patient in the next 30 minutes. RN communicated that PA asked [place] to call, and wanted to follow up.   Will continue to monitor patient.

## 2018-08-06 NOTE — Progress Notes (Signed)
7 Days Post-Op    CC: Abdominal pain  Subjective: Patient is alert and fairly bright this a.m.She says she vomited yesterday with Full liquids.  She also complained of her feet hurting.  She is in Foot drop boots.  She says the left foot hurts more than the right foot.  She has a cold right foot.  This is the first time I have seen her.  I could not palpate pulses in either foot or popiteal so I got a Doppler.  She has good pulses in both femorals, I got good flow with the doppler from both popliteals.  I got flow from the left DP, no ATon the left.  NO PT or DP on the right.  Objective: Vital signs in last 24 hours: Temp:  [98.7 F (37.1 C)-99 F (37.2 C)] 98.7 F (37.1 C) (04/29 0648) Pulse Rate:  [86-89] 89 (04/29 0648) Resp:  [17-20] 20 (04/29 0648) BP: (117-128)/(65-76) 128/76 (04/29 0648) SpO2:  [94 %-98 %] 94 % (04/29 0648) Last BM Date: 08/05/18 1260 p.o. 94 IV 1050 urine Drained 15 Stool x3 Afebrile vital signs are stable. Labs show rising WBC 16.8 today.  Potassium is 4.0 this a.m. CBC Latest Ref Rng & Units 08/05/2018 08/04/2018 08/02/2018  WBC 4.0 - 10.5 K/uL 16.8(H) 12.1(H) 11.0(H)  Hemoglobin 12.0 - 15.0 g/dL 10.9(L) 10.9(L) 10.5(L)  Hematocrit 36.0 - 46.0 % 33.4(L) 35.2(L) 32.9(L)  Platelets 150 - 400 K/uL 334 264 247   Intake/Output from previous day: 04/28 0701 - 04/29 0700 In: 1354.4 [P.O.:1260; I.V.:94.4] Out: 1065 [Urine:1050; Drains:15] Intake/Output this shift: No intake/output data recorded.  General appearance: alert, cooperative and no distress Resp: clear to auscultation bilaterally Cardio: regular rate and rhythm, S1, S2 normal, no murmur, click, rub or gallop GI: Soft, she complains of pain mid abdomen I took the dressing off the right drain site and it is okay.  The left drain is clear serous fluid.  She is not distended.  Has had some nausea, but good bowel function.  No peritonitis. Extremities: She complains of pain in both feet.  Left foot is  cool but she has a Doppler PT pulse.  The right foot is cool and somewhat discolored.  No pulses were felt nor found with the Doppler in the AT or PT.  She can move her toes a little bit on the left.  She says she cannot feel me touching her.  On the right side there is no motion or sensation reported on exam.  Lab Results:  Recent Labs    08/04/18 0747 08/05/18 0839  WBC 12.1* 16.8*  HGB 10.9* 10.9*  HCT 35.2* 33.4*  PLT 264 334    BMET Recent Labs    08/04/18 0747 08/05/18 0839  NA 134*  --   K 3.8 4.0  CL 108  --   CO2 19*  --   GLUCOSE 103*  --   BUN 10  --   CREATININE 0.43*  --   CALCIUM 7.8*  --    PT/INR No results for input(s): LABPROT, INR in the last 72 hours.  Recent Labs  Lab 07/30/18 2001  AST 44*  ALT 23  ALKPHOS 52  BILITOT 0.8  PROT 5.8*  ALBUMIN 3.2*     Lipase     Component Value Date/Time   LIPASE 130 (H) 07/30/2018 2001     Medications: . acetaminophen  500 mg Oral TID WC & HS  . Chlorhexidine Gluconate Cloth  6 each Topical Once  .  Chlorhexidine Gluconate Cloth  6 each Topical Daily  . Deutetrabenazine  24 mg Oral BID  . enoxaparin (LOVENOX) injection  40 mg Subcutaneous Q24H  . feeding supplement  237 mL Oral BID BM  . lip balm  1 application Topical BID  . mouth rinse  15 mL Mouth Rinse BID  . pantoprazole  40 mg Oral BID  . polyethylene glycol  17 g Oral BID  . sodium chloride flush  10-40 mL Intracatheter Q12H    Assessment/Plan Acute kidney disease Dehydration Huntington's disease Scoliosis Osteoarthritis Hx of LONG term NSAID use Cold right foot - new  Complaining of foot pain both feet  Perforated gastric ulcer,Biliary Peritonitis with Intra-abdominal abcesses S/p Omental [name] patich, diagnostic laparoscopy, washout/drainage of intraabdominal abscess, 07/30/18, Dr. Viviann SpareSteven GrossPOD# 6  ZOX:WRUEAFEN:Clear liquids/IV fluids >> NPO for now awaiting Doppler studies ID: Maxipime 4/22 x 1; Flagyl 4/22 - 4/24;Rocephin  4/23>> day5 DVT: SCD - check on Lovenox 4/24 Follow up: Dr. Karie SodaSteven Gross POC: Spouse Rickard RhymesLewis Mans [phone]   Plan: I have ordered stat ABIs from the vascular lab and I will talk to Dr. early as soon as I have those results.  Made her n.p.o. for now.  We will also plan to repeat labs in a.m. and watch her WBC.  If it continues to rise she may need a CT scan of her abdomen.  Awaiting doppler studies.       LOS: 7 days    , 08/06/2018 [phone]

## 2018-08-07 ENCOUNTER — Inpatient Hospital Stay (HOSPITAL_COMMUNITY): Payer: Medicare Other

## 2018-08-07 ENCOUNTER — Encounter (HOSPITAL_COMMUNITY): Payer: Self-pay | Admitting: Vascular Surgery

## 2018-08-07 DIAGNOSIS — I744 Embolism and thrombosis of arteries of extremities, unspecified: Secondary | ICD-10-CM

## 2018-08-07 LAB — CBC
HCT: 27.6 % — ABNORMAL LOW (ref 36.0–46.0)
Hemoglobin: 9 g/dL — ABNORMAL LOW (ref 12.0–15.0)
MCH: 28.4 pg (ref 26.0–34.0)
MCHC: 32.6 g/dL (ref 30.0–36.0)
MCV: 87.1 fL (ref 80.0–100.0)
Platelets: 627 10*3/uL — ABNORMAL HIGH (ref 150–400)
RBC: 3.17 MIL/uL — ABNORMAL LOW (ref 3.87–5.11)
RDW: 13.5 % (ref 11.5–15.5)
WBC: 21.9 10*3/uL — ABNORMAL HIGH (ref 4.0–10.5)
nRBC: 0 % (ref 0.0–0.2)

## 2018-08-07 LAB — ECHOCARDIOGRAM LIMITED
Height: 64 in
Weight: 2264.57 oz

## 2018-08-07 LAB — BASIC METABOLIC PANEL
Anion gap: 12 (ref 5–15)
BUN: 6 mg/dL — ABNORMAL LOW (ref 8–23)
CO2: 24 mmol/L (ref 22–32)
Calcium: 8.1 mg/dL — ABNORMAL LOW (ref 8.9–10.3)
Chloride: 97 mmol/L — ABNORMAL LOW (ref 98–111)
Creatinine, Ser: 0.43 mg/dL — ABNORMAL LOW (ref 0.44–1.00)
GFR calc Af Amer: >60 mL/min (ref >60)
GFR calc non Af Amer: >60 mL/min (ref >60)
Glucose, Bld: 112 mg/dL — ABNORMAL HIGH (ref 70–99)
Potassium: 3.5 mmol/L (ref 3.5–5.1)
Sodium: 133 mmol/L — ABNORMAL LOW (ref 135–145)

## 2018-08-07 MED ORDER — METHOCARBAMOL 500 MG PO TABS
500.0000 mg | ORAL_TABLET | Freq: Three times a day (TID) | ORAL | Status: DC
  Administered 2018-08-07 – 2018-08-13 (×18): 500 mg via ORAL
  Filled 2018-08-07 (×18): qty 1

## 2018-08-07 MED ORDER — VANCOMYCIN HCL IN DEXTROSE 1-5 GM/200ML-% IV SOLN
1000.0000 mg | INTRAVENOUS | Status: AC
  Filled 2018-08-07: qty 200

## 2018-08-07 NOTE — Progress Notes (Signed)
Patient verbalized she would be agreeable to surgery to staff and wanted to speak with Dr. Chestine Spore again regarding surgery tomorrow. Paged Dr. Chestine Spore and message given to OR staff regarding above information.

## 2018-08-07 NOTE — Progress Notes (Signed)
Patient ID: Natasha Frederick, female   DOB: 11/28/1955, 63 y.o.   MRN: [id number]    1 Day Post-Op  Subjective: Patient appropriately disheartened this morning.  No nausea.  Did have a small liquid stool.  Minimal abdominal pain.  Most of her pain in her feet and legs.    Objective: Vital signs in last 24 hours: Temp:  [98.6 F (37 C)-100 F (37.8 C)] 99.6 F (37.6 C) (04/30 0822) Pulse Rate:  [80-102] 84 (04/30 0822) Resp:  [15-24] 17 (04/30 0822) BP: (91-129)/(59-93) 125/78 (04/30 0822) SpO2:  [90 %-98 %] 98 % (04/30 0822) Weight:  [64.2 kg] 64.2 kg (04/29 1304) Last BM Date: 08/06/18  Intake/Output from previous day: 04/29 0701 - 04/30 0700 In: 1500 [I.V.:1500] Out: 1515 [Urine:1400; Drains:15; Blood:100] Intake/Output this shift: No intake/output data recorded.  PE: Heart: regular Lungs: CTAB Abd: soft, appropriately tender, +BS, incisions are c/d/i, LUQ drain with essentially no output present currently Ext: BLE wrapped in ACE wraps.    Lab Results:  Recent Labs    08/05/18 0839  WBC 16.8*  HGB 10.9*  HCT 33.4*  PLT 334   BMET Recent Labs    08/05/18 0839  K 4.0   PT/INR No results for input(s): LABPROT, INR in the last 72 hours. CMP     Component Value Date/Time   NA 134 (L) 08/04/2018 0747   K 4.0 08/05/2018 0839   CL 108 08/04/2018 0747   CO2 19 (L) 08/04/2018 0747   GLUCOSE 103 (H) 08/04/2018 0747   BUN 10 08/04/2018 0747   CREATININE 0.43 (L) 08/04/2018 0747   CALCIUM 7.8 (L) 08/04/2018 0747   PROT 5.8 (L) 07/30/2018 2001   ALBUMIN 3.2 (L) 07/30/2018 2001   AST 44 (H) 07/30/2018 2001   ALT 23 07/30/2018 2001   ALKPHOS 52 07/30/2018 2001   BILITOT 0.8 07/30/2018 2001   GFRNONAA >60 08/04/2018 0747   GFRAA >60 08/04/2018 0747   Lipase     Component Value Date/Time   LIPASE 130 (H) 07/30/2018 2001       Studies/Results: Ct Angio Ao+bifem W & Or Wo Contrast  Result Date: 08/06/2018 CLINICAL DATA:  Arterial embolism, lower extremity.  Foot pain left greater than right. Cold right foot. Nonpalpable distal pulses. EXAM: CT ANGIOGRAPHY OF ABDOMINAL AORTA WITH ILIOFEMORAL RUNOFF TECHNIQUE: Multidetector CT imaging of the abdomen, pelvis and lower extremities was performed using the standard protocol during bolus administration of intravenous contrast. Multiplanar CT image reconstructions and MIPs were obtained to evaluate the vascular anatomy. CONTRAST:  OMNIPAQUE IOHEXOL 350 MG/ML SOLN COMPARISON:  CT 07/30/2018 FINDINGS: VASCULAR Aorta: Moderate calcified atheromatous plaque. Eccentric nonocclusive focal mural thrombi in the infrarenal segment as before. No aneurysm, dissection, or stenosis. Celiac: Patent without evidence of aneurysm, dissection, vasculitis or significant stenosis. Splenic artery is patent to the hilum but there is incomplete opacification of parenchymal branches. SMA: Patent without evidence of aneurysm, dissection, vasculitis or significant stenosis. Renals: Both renal arteries are patent without evidence of aneurysm, dissection, vasculitis, fibromuscular dysplasia or significant stenosis. IMA: Patent without evidence of aneurysm, dissection, vasculitis or significant stenosis. RIGHT Lower Extremity Inflow: Mild calcified plaque in the distal common iliac. Thrombus or plaque in the proximal internal iliac, nearly occlusive, stable since prior study. External iliac unremarkable. Common femoral patent. Outflow: Deep femoral branches patent. SFA unremarkable. Popliteal arterial occlusion below the knee with suggestion of meniscus suggesting embolus or thrombus in the lumen. There is no significant collateral reconstitution of distal popliteal artery.  Runoff: There is no significant collateral reconstitution of trifurcation runoff vessels. LEFT Lower Extremity Inflow: Minimal nonocclusive plaque in the common iliac and internal iliac arteries. External iliac widely patent. Common femoral unremarkable. Outflow: Deep femoral  branches patent. SFA patent. Popliteal patent. Runoff: Anterior tibial occludes a few cm from its origin, without distal reconstitution. There is origin occlusion of the tibioperoneal trunk. Collateral reconstitution of the peroneal artery segmentally at the mid calf level, and collateral reconstitution of the posterior tibial artery at the lower calf level, patent across the ankle as the primary blood supply to the foot. Veins: No obvious venous abnormality within the limitations of this arterial phase study. Review of the MIP images confirms the above findings. NON-VASCULAR Lower chest: Small pleural effusions left greater than right. Dependent atelectasis in the lung bases. Hepatobiliary: Gallbladder incompletely distended, unremarkable. Small subcapsular areas of decreased enhancement in hepatic segment 5. No biliary ductal dilatation. Pancreas: Unremarkable. No pancreatic ductal dilatation or surrounding inflammatory changes. Spleen: Enlarged compared to the previous weeks scan, with little contrast enhancement. Adrenals/Urinary Tract: Normal adrenals. Left kidney unremarkable. Wedge-shaped area of decreased enhancement in the upper pole right kidney, which was present on the prior study. No new parenchymal perfusion defect. No hydronephrosis. Gas in the incompletely distended urinary bladder presumably from recent instrumentation. Stomach/Bowel: The stomach is incompletely distended. There is fluid distention of the duodenum, and mild fluid distention of some proximal small bowel loops, more decompressed distally without any discrete transition point. The colon is nondistended. Lymphatic: No abdominal or pelvic adenopathy. Reproductive: Tubal ligation clips. Uterus unremarkable. No adnexal mass. Other: Trace pelvic ascites. The free air and abdominal ascites seen previously have resolved postoperatively. Musculoskeletal: Left lower quadrant surgical drain extends to the subhepatic region. Lumbar fixation  hardware L2-L4 with adjacent level disease L1-2. Left hip arthroplasty hardware. Right hip DJD. IMPRESSION: VASCULAR 1. Nonocclusive mural thrombus in the infrarenal abdominal aorta, may represent a source of distal emboli. 2. 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 3. Below-knee RIGHT popliteal artery occlusion, possibly embolic, without significant distal reconstitution. 4. Occlusion of LEFT tibioperoneal trunk and proximal anterior tibial artery, possible embolic, with reconstituted distal posterior tibial runoff. NON-VASCULAR:. 1. Splenic hypoperfusion and enlargement since exam 1 week ago, possibly of vascular etiology. Consider CTA chest for evaluation of potential proximal source of emboli. 2. Wedge-shaped perfusion defect in the upper pole right kidney, stable since prior study. 3. Postop resolution of abdominal ascites and free air. 4. Small pleural effusions left greater than right. Electronically Signed   By: Corlis Leak  Hassell M.D.   On: 08/06/2018 12:55   Vas [place] Vanice Sarahbi With/wo Tbi  Result Date: 08/06/2018 LOWER EXTREMITY DOPPLER STUDY Indications: Rest pain.  Performing Technologist: Blanch MediaMegan Riddle RVS  Examination Guidelines: A complete evaluation includes at minimum, Doppler waveform signals and systolic blood pressure reading at the level of bilateral brachial, anterior tibial, and posterior tibial arteries, when vessel segments are accessible. Bilateral testing is considered an integral part of a complete examination. Photoelectric Plethysmograph (PPG) waveforms and toe systolic pressure readings are included as required and additional duplex testing as needed. Limited examinations for reoccurring indications may be performed as noted.  ABI Findings: +--------+------------------+-----+---------+--------+  Right    Rt Pressure (mmHg) Index Waveform  Comment   +--------+------------------+-----+---------+--------+  Brachial 133                      triphasic            +--------+------------------+-----+---------+--------+  PTA                               absent              +--------+------------------+-----+---------+--------+  DP                                absent              +--------+------------------+-----+---------+--------+ +--------+------------------+-----+-------------------+------------------------+  Left     Lt Pressure (mmHg) Index Waveform            Comment                   +--------+------------------+-----+-------------------+------------------------+  Brachial                                              unable to obtain due to                                                          line placement            +--------+------------------+-----+-------------------+------------------------+  PTA      55                 0.41  dampened monophasic                           +--------+------------------+-----+-------------------+------------------------+  DP                                absent                                        +--------+------------------+-----+-------------------+------------------------+ +-------+-----------+-----------+------------+------------+  ABI/TBI Today's ABI Today's TBI Previous ABI Previous TBI  +-------+-----------+-----------+------------+------------+  Right                                                      +-------+-----------+-----------+------------+------------+  Left    0.41                                               +-------+-----------+-----------+------------+------------+  Summary: Left: Resting left ankle-brachial index indicates severe left lower extremity arterial disease.  *See table(s) above for measurements and observations.  Electronically signed by Gretta Began MD on 08/06/2018 at 5:17:46 PM.   Final     Anti-infectives: Anti-infectives (From admission, onward)   Start     Dose/Rate Route Frequency Ordered Stop   07/31/18 0600  clindamycin (CLEOCIN) IVPB 900 mg     900 mg 100 mL/hr over 30  Minutes Intravenous On call to O.R. 07/30/18 2208 07/30/18 2352  07/31/18 0600  gentamicin (GARAMYCIN) 310 mg in dextrose 5 % 100 mL IVPB     5 mg/kg  61.2 kg 107.8 mL/hr over 60 Minutes Intravenous On call to O.R. 07/30/18 2208 07/31/18 0006   07/31/18 0600  cefTRIAXone (ROCEPHIN) 2 g in sodium chloride 0.9 % 100 mL IVPB    Note to Pharmacy:  Pharmacy may adjust dosing strength / duration / interval for maximal efficacy   2 g 200 mL/hr over 30 Minutes Intravenous Daily 07/31/18 0258 08/04/18 0616   07/31/18 0400  metroNIDAZOLE (FLAGYL) IVPB 500 mg     500 mg 100 mL/hr over 60 Minutes Intravenous Every 6 hours 07/31/18 0258 08/01/18 0546   07/31/18 0003  clindamycin (CLEOCIN) 900 mg, gentamicin (GARAMYCIN) 240 mg in sodium chloride 0.9 % 1,000 mL for intraperitoneal lavage  Status:  Discontinued       As needed 07/31/18 0003 07/31/18 0052   07/30/18 2249  clindamycin (CLEOCIN) 900 MG/50ML IVPB    Note to Pharmacy:  Mirian Mo   : cabinet override      07/30/18 2249 07/30/18 2322   07/30/18 2215  clindamycin (CLEOCIN) 900 mg, gentamicin (GARAMYCIN) 240 mg in sodium chloride 0.9 % 1,000 mL for intraperitoneal lavage  Status:  Discontinued    Note to Pharmacy:  Have in the  OR room for final irrigation in bowel surgery case to minimize risk of abscess/infection Pharmacy may adjust dosing strength, schedule, rate of infusion, etc as needed to optimize therapy    Irrigation To Surgery 07/30/18 2208 07/31/18 1024   07/30/18 2130  ceFEPIme (MAXIPIME) 2 g in sodium chloride 0.9 % 100 mL IVPB     2 g 200 mL/hr over 30 Minutes Intravenous  Once 07/30/18 2129 07/30/18 2232   07/30/18 2130  metroNIDAZOLE (FLAGYL) IVPB 500 mg     500 mg 100 mL/hr over 60 Minutes Intravenous  Once 07/30/18 2129 07/30/18 2306       Assessment/Plan Acute kidney disease Dehydration Huntington's disease Scoliosis Osteoarthritis Hx of LONG term NSAID use BLE ischemia, POD 1, s/p Bilateral popliteal and tibial  embolectomies.  Left lateral and medial fasciotomy, Dr. Tawanna Cooler Early -patient expresses today that she does not want an amputation of her RLE even though this one is the one that is frankly dead, but wants an amputation of her LLE which currently appears possibly viable as it has some pulses present.  Ultimately she states this is because if she leaves the right foot that it may cause her to become septic and ultimately to pass away and this is her out from her Huntington's.  I will need to discuss this situation with vascular.  The patient states she has NOT spoken to her husband about her feelings on the matter yet.  I encouraged her to do so.  She really does not want to speak to him until she gets definitive confirmation of whether vascular will amputate her LLE or not.  I told her if it is viable that they would unlikely pursue amputation.  She asked if her husband could come see her so she could discuss this difficult situation in person.  I have addressed this concern with the director and AD of the floor.  They tell me it is unlikely that the hospital will allow this.  I told them they could discuss that scenario with her and if the answer is no to do their best to arrange for FaceTime or some other form to allow them to be able  to speak, hopefully more than just a phone conversation.  Perforated gastric ulcer S/p Omental [name] patich, diagnostic laparoscopy, washout/drainage of intraabdominal abscess, 07/30/18, Dr. Viviann Spare GrossPOD#8 -adv to soft diet.  If tolerates this and drain remains stable, can likely remove soon. -OOB to chair as able given issues in her legs  ZOX:WRUE diet ID: Maxipime 4/22 x 1; Flagyl 4/22 - 4/24;Rocephin 4/23>>day5, all abx completed at this time DVT: Lovenox  Follow up: Dr. Karie Soda POC: Spouse Rickard Rhymes [phone]   LOS: 8 days    Letha Cape , [facility] Surgery 08/07/2018, 10:09 AM Pager: [phone]

## 2018-08-07 NOTE — Progress Notes (Signed)
Physical Therapy Treatment Patient Details Name: Natasha BeckmannJudith Frederick MRN: [id number] DOB: 12/29/1955 Today's Date: 08/07/2018    History of Present Illness Pt is a 63 year old female s/p lap Graham patch of perforated gastric ulcer on 07/30/18 by Dr. Michaell CowingGross.  [facility] needed bilateral popliteal and tibial embolectomies as well as left fasciotomy on 4/29.   PMHx significant for Huntington's disease, scoliosis, back surgery    PT Comments    Pt admitted with above diagnosis. Pt currently with functional limitations due to balance and endurance deficits. Pt able to transfer with STedy to recliner with mod assist overall of 2 persons.  Pt tells this PT she may lose one of her legs and she doesn't know what she wants to do.  Provided support for pt. Pt will benefit from skilled PT to increase their independence and safety with mobility to allow discharge to the venue listed below.     Follow Up Recommendations  SNF;Supervision/Assistance - 24 hour     Equipment Recommendations  Wheelchair (measurements PT);Wheelchair cushion (measurements PT)    Recommendations for Other Services       Precautions / Restrictions Precautions Precautions: Fall Restrictions Weight Bearing Restrictions: No    Mobility  Bed Mobility Overal bed mobility: Needs Assistance Bed Mobility: Supine to Sit Rolling: Mod assist Sidelying to sit: Mod assist       General bed mobility comments: Needed assist to move LES off bed and elevate trunk  Transfers Overall transfer level: Needs assistance   Transfers: Sit to/from Stand Sit to Stand: Mod assist;+2 physical assistance         General transfer comment: Pt impulsive with movement.  Needed assist to power up but once initiated, she pops up quick.    Ambulation/Gait                 Stairs             Wheelchair Mobility    Modified Rankin (Stroke Patients Only)       Balance Overall balance assessment: Needs  assistance Sitting-balance support: No upper extremity supported;Feet supported Sitting balance-Leahy Scale: Fair Sitting balance - Comments: min guard for safety Postural control: Posterior lean Standing balance support: Bilateral upper extremity supported;During functional activity Standing balance-Leahy Scale: Poor Standing balance comment: Stood x 2 with STedy with min assist once up in standing.  Pt did have PRAFOS oon per pt request.                            Cognition Arousal/Alertness: Awake/alert Behavior During Therapy: WFL for tasks assessed/performed Overall Cognitive Status: Within Functional Limits for tasks assessed Area of Impairment: Safety/judgement;Following commands                       Following Commands: Follows one step commands with increased time Safety/Judgement: Decreased awareness of safety     General Comments: pt had a difficult time following and performing tasks      Exercises General Exercises - Lower Extremity Ankle Circles/Pumps: Both;AAROM;10 reps;Supine Quad Sets: AROM;Both;10 reps;Supine Heel Slides: AROM;Both;10 reps;Supine    General Comments        Pertinent Vitals/Pain Pain Assessment: Faces Faces Pain Scale: Hurts even more Pain Location: abdomen and knees (with stedy - placed pillow and no pain) Pain Descriptors / Indicators: Sore Pain Intervention(s): Monitored during session;Limited activity within patient's tolerance;Repositioned;Premedicated before session    Home  Living Family/patient expects to be discharged to:: Private residence Living Arrangements: Spouse/significant other Available Help at Discharge: Family   Home Access: Stairs to enter(3 to garage; hand rail)     Home Equipment: Environmental consultant - 2 wheels;Bedside commode Additional Comments: nursing reports pt lives with spouse, pt poor historian    Prior Function        Comments: called husband. pt normally dresses herself; he does IADLs.  He  assists with ambulating--mostly him holding onto her   PT Goals (current goals can now be found in the care plan section) Acute Rehab PT Goals Patient Stated Goal: none stated Progress towards PT goals: Progressing toward goals    Frequency    Min 3X/week      PT Plan Current plan remains appropriate    Co-evaluation              AM-PAC PT "6 Clicks" Mobility   Outcome Measure  Help needed turning from your back to your side while in a flat bed without using bedrails?: A Little Help needed moving from lying on your back to sitting on the side of a flat bed without using bedrails?: A Lot Help needed moving to and from a bed to a chair (including a wheelchair)?: A Lot Help needed standing up from a chair using your arms (e.g., wheelchair or bedside chair)?: A Lot Help needed to walk in hospital room?: A Lot Help needed climbing 3-5 steps with a railing? : Total 6 Click Score: 12    End of Session Equipment Utilized During Treatment: Gait belt Activity Tolerance: Patient tolerated treatment well Patient left: in chair;with call bell/phone within reach;with chair alarm set Nurse Communication: Mobility status;Need for lift equipment PT Visit Diagnosis: Muscle weakness (generalized) (M62.81);Other abnormalities of gait and mobility (R26.89)     Time: 1000-1033 PT Time Calculation (min) (ACUTE ONLY): 33 min  Charges:  $Therapeutic Activity: 23-37 mins                     Dawn White,PT Acute Rehabilitation Services Pager:  [phone]  Office:  [phone]     Berline Lopes 08/07/2018, 1:13 PM

## 2018-08-07 NOTE — Progress Notes (Signed)
  Echocardiogram 2D Echocardiogram has been performed.  Tye Savoy 08/07/2018, 9:51 AM

## 2018-08-07 NOTE — Progress Notes (Signed)
Spoke to Mr. Steidle, patient's husband, and updated him on her status.  Letha Cape 2:23 PM 08/07/2018

## 2018-08-07 NOTE — H&P (View-Only) (Signed)
[facility]  Subjective  -continues to have foot drop in both feet.  Pain reasonably controlled.   Objective 125/78 84 99.6 F (37.6 C) (Axillary) 17 98%  Intake/Output Summary (Last 24 hours) at 08/07/2018 1448 Last data filed at 08/07/2018 1442 Gross per 24 hour  Intake 740 ml  Output 1400 ml  Net -660 ml    Right foot cadaveric with profound foot drop and no signals Left foot warm, foot drop, brisk posterior tibial signal.  Laboratory Lab Results: Recent Labs    08/05/18 0839 08/07/18 1156  WBC 16.8* 21.9*  HGB 10.9* 9.0*  HCT 33.4* 27.6*  PLT 334 627*   BMET Recent Labs    08/05/18 0839 08/07/18 1156  NA  --  133*  K 4.0 3.5  CL  --  97*  CO2  --  24  GLUCOSE  --  112*  BUN  --  6*  CREATININE  --  0.43*  CALCIUM  --  8.1*    COAG Lab Results  Component Value Date   INR 0.93 07/09/2014   No results found for: PTT  Assessment/Planning:  Long discussion with Natasha Frederick this morning regarding the events from yesterday including bilateral popliteal and tibial embolectomies with left leg fasciotomies.  Discussed with her that her right leg is not salvageable and that she would need at least a below-knee amputation on the right after discussion with Dr. Arbie Cookey.  Discussed that she is at high risk for limb loss on the left as well given that the muscle looked marginal but she does have a brisk posterior tibial signal today.  Initially this morning patient was refusing amputation and stated that she has a terminal diagnosis with Huntington's disease and did not want to lose her legs.  Subsequently followed up with her again this afternoon and answer all her questions that she is now amendable to the right BKA tomorrow.  I will post her for the OR tomorrow with Dr. Arbie Cookey for right BKA.  Discussed we could closely evaluate the left leg through the weekend but at this time she wants to try and save that at all cost.  Difficult  situation.   Natasha Frederick 08/07/2018 2:48 PM --

## 2018-08-07 NOTE — Progress Notes (Signed)
[facility]  Subjective  -continues to have foot drop in both feet.  Pain reasonably controlled.   Objective 125/78 84 99.6 F (37.6 C) (Axillary) 17 98%  Intake/Output Summary (Last 24 hours) at 08/07/2018 1448 Last data filed at 08/07/2018 1442 Gross per 24 hour  Intake 740 ml  Output 1400 ml  Net -660 ml    Right foot cadaveric with profound foot drop and no signals Left foot warm, foot drop, brisk posterior tibial signal.  Laboratory Lab Results: Recent Labs    08/05/18 0839 08/07/18 1156  WBC 16.8* 21.9*  HGB 10.9* 9.0*  HCT 33.4* 27.6*  PLT 334 627*   BMET Recent Labs    08/05/18 0839 08/07/18 1156  NA  --  133*  K 4.0 3.5  CL  --  97*  CO2  --  24  GLUCOSE  --  112*  BUN  --  6*  CREATININE  --  0.43*  CALCIUM  --  8.1*    COAG Lab Results  Component Value Date   INR 0.93 07/09/2014   No results found for: PTT  Assessment/Planning:  Long discussion with Ms. Tarkington this morning regarding the events from yesterday including bilateral popliteal and tibial embolectomies with left leg fasciotomies.  Discussed with her that her right leg is not salvageable and that she would need at least a below-knee amputation on the right after discussion with Dr. Early.  Discussed that she is at high risk for limb loss on the left as well given that the muscle looked marginal but she does have a brisk posterior tibial signal today.  Initially this morning patient was refusing amputation and stated that she has a terminal diagnosis with Huntington's disease and did not want to lose her legs.  Subsequently followed up with her again this afternoon and answer all her questions that she is now amendable to the right BKA tomorrow.  I will post her for the OR tomorrow with Dr. Early for right BKA.  Discussed we could closely evaluate the left leg through the weekend but at this time she wants to try and save that at all cost.  Difficult  situation.   Christopher J Clark 08/07/2018 2:48 PM --   

## 2018-08-07 NOTE — Progress Notes (Signed)
Updated Mr. Marcoe this afternoon on the phone regarding plan for right BKA tomorrow with Dr. Arbie Cookey.  Discussed high risk for limb loss of the left leg as well, but the patient would like to attempt limb salvage at this time.  Husband seems very appreciative for the updates.  Cephus Shelling, MD [facility] Office: [phone] Pager: [phone]  Cephus Shelling

## 2018-08-08 ENCOUNTER — Inpatient Hospital Stay (HOSPITAL_COMMUNITY): Payer: Medicare Other | Admitting: Anesthesiology

## 2018-08-08 ENCOUNTER — Encounter (HOSPITAL_COMMUNITY): Admission: EM | Disposition: A | Discharge status: Skilled Nursing Facility | Payer: Self-pay | Source: Home / Self Care

## 2018-08-08 ENCOUNTER — Inpatient Hospital Stay (HOSPITAL_COMMUNITY): Payer: Medicare Other

## 2018-08-08 ENCOUNTER — Encounter (HOSPITAL_COMMUNITY): Payer: Self-pay | Admitting: Certified Registered"

## 2018-08-08 DIAGNOSIS — I743 Embolism and thrombosis of arteries of the lower extremities: Secondary | ICD-10-CM

## 2018-08-08 HISTORY — PX: AMPUTATION: SHX166

## 2018-08-08 LAB — BASIC METABOLIC PANEL
Anion gap: 10 (ref 5–15)
BUN: 7 mg/dL — ABNORMAL LOW (ref 8–23)
CO2: 24 mmol/L (ref 22–32)
Calcium: 7.9 mg/dL — ABNORMAL LOW (ref 8.9–10.3)
Chloride: 101 mmol/L (ref 98–111)
Creatinine, Ser: 0.46 mg/dL (ref 0.44–1.00)
GFR calc Af Amer: >60 mL/min (ref >60)
GFR calc non Af Amer: >60 mL/min (ref >60)
Glucose, Bld: 97 mg/dL (ref 70–99)
Potassium: 3.6 mmol/L (ref 3.5–5.1)
Sodium: 135 mmol/L (ref 135–145)

## 2018-08-08 LAB — CBC
HCT: 25.7 % — ABNORMAL LOW (ref 36.0–46.0)
Hemoglobin: 8.5 g/dL — ABNORMAL LOW (ref 12.0–15.0)
MCH: 28.6 pg (ref 26.0–34.0)
MCHC: 33.1 g/dL (ref 30.0–36.0)
MCV: 86.5 fL (ref 80.0–100.0)
Platelets: 683 10*3/uL — ABNORMAL HIGH (ref 150–400)
RBC: 2.97 MIL/uL — ABNORMAL LOW (ref 3.87–5.11)
RDW: 13.7 % (ref 11.5–15.5)
WBC: 18.7 10*3/uL — ABNORMAL HIGH (ref 4.0–10.5)
nRBC: 0 % (ref 0.0–0.2)

## 2018-08-08 SURGERY — AMPUTATION BELOW KNEE
Anesthesia: General | Laterality: Right

## 2018-08-08 MED ORDER — IOHEXOL 350 MG/ML SOLN
100.0000 mL | Freq: Once | INTRAVENOUS | Status: AC | PRN
  Administered 2018-08-08: 100 mL via INTRAVENOUS

## 2018-08-08 MED ORDER — ENOXAPARIN SODIUM 40 MG/0.4ML ~~LOC~~ SOLN
40.0000 mg | SUBCUTANEOUS | Status: DC
  Administered 2018-08-09 – 2018-08-13 (×5): 40 mg via SUBCUTANEOUS
  Filled 2018-08-08 (×5): qty 0.4

## 2018-08-08 MED ORDER — SUCCINYLCHOLINE CHLORIDE 200 MG/10ML IV SOSY
PREFILLED_SYRINGE | INTRAVENOUS | Status: AC
  Filled 2018-08-08: qty 10

## 2018-08-08 MED ORDER — HYDROMORPHONE HCL 1 MG/ML IJ SOLN
INTRAMUSCULAR | Status: AC
  Filled 2018-08-08: qty 1

## 2018-08-08 MED ORDER — FENTANYL CITRATE (PF) 250 MCG/5ML IJ SOLN
INTRAMUSCULAR | Status: DC | PRN
  Administered 2018-08-08: 100 ug via INTRAVENOUS
  Administered 2018-08-08 (×2): 50 ug via INTRAVENOUS
  Administered 2018-08-08 (×2): 25 ug via INTRAVENOUS

## 2018-08-08 MED ORDER — LABETALOL HCL 5 MG/ML IV SOLN: 10.0000 mg | INTRAVENOUS | Status: DC | PRN

## 2018-08-08 MED ORDER — POTASSIUM CHLORIDE CRYS ER 20 MEQ PO TBCR
20.0000 meq | EXTENDED_RELEASE_TABLET | Freq: Every day | ORAL | Status: AC | PRN
  Administered 2018-08-13: 09:00:00 40 meq via ORAL

## 2018-08-08 MED ORDER — DEXAMETHASONE SODIUM PHOSPHATE 10 MG/ML IJ SOLN
INTRAMUSCULAR | Status: AC
  Filled 2018-08-08: qty 1

## 2018-08-08 MED ORDER — GUAIFENESIN-DM 100-10 MG/5ML PO SYRP: 15.0000 mL | ORAL_SOLUTION | ORAL | Status: DC | PRN

## 2018-08-08 MED ORDER — FENTANYL CITRATE (PF) 250 MCG/5ML IJ SOLN
INTRAMUSCULAR | Status: AC
  Filled 2018-08-08: qty 5

## 2018-08-08 MED ORDER — PHENOL 1.4 % MT LIQD: 1.0000 | OROMUCOSAL | Status: DC | PRN

## 2018-08-08 MED ORDER — LIDOCAINE 2% (20 MG/ML) 5 ML SYRINGE
INTRAMUSCULAR | Status: AC
  Filled 2018-08-08: qty 5

## 2018-08-08 MED ORDER — VANCOMYCIN HCL IN DEXTROSE 1-5 GM/200ML-% IV SOLN
1000.0000 mg | Freq: Two times a day (BID) | INTRAVENOUS | Status: AC
  Administered 2018-08-08 – 2018-08-09 (×2): 1000 mg via INTRAVENOUS
  Filled 2018-08-08 (×2): qty 200

## 2018-08-08 MED ORDER — LIDOCAINE 2% (20 MG/ML) 5 ML SYRINGE
INTRAMUSCULAR | Status: DC | PRN
  Administered 2018-08-08: 100 mg via INTRAVENOUS

## 2018-08-08 MED ORDER — PROMETHAZINE HCL 25 MG/ML IJ SOLN
INTRAMUSCULAR | Status: AC
  Filled 2018-08-08: qty 1

## 2018-08-08 MED ORDER — PHENYLEPHRINE 40 MCG/ML (10ML) SYRINGE FOR IV PUSH (FOR BLOOD PRESSURE SUPPORT)
PREFILLED_SYRINGE | INTRAVENOUS | Status: AC
  Filled 2018-08-08: qty 10

## 2018-08-08 MED ORDER — SODIUM CHLORIDE 0.9 % IV SOLN
INTRAVENOUS | Status: DC
  Administered 2018-08-08: 15:00:00 via INTRAVENOUS

## 2018-08-08 MED ORDER — PHENYLEPHRINE 40 MCG/ML (10ML) SYRINGE FOR IV PUSH (FOR BLOOD PRESSURE SUPPORT)
PREFILLED_SYRINGE | INTRAVENOUS | Status: DC | PRN
  Administered 2018-08-08: 80 ug via INTRAVENOUS
  Administered 2018-08-08: 120 ug via INTRAVENOUS

## 2018-08-08 MED ORDER — SUCCINYLCHOLINE CHLORIDE 200 MG/10ML IV SOSY
PREFILLED_SYRINGE | INTRAVENOUS | Status: DC | PRN
  Administered 2018-08-08: 100 mg via INTRAVENOUS

## 2018-08-08 MED ORDER — SODIUM CHLORIDE 0.9 % IV SOLN
INTRAVENOUS | Status: DC | PRN
  Administered 2018-08-08: 50 ug/min via INTRAVENOUS

## 2018-08-08 MED ORDER — VANCOMYCIN HCL 1000 MG IV SOLR
INTRAVENOUS | Status: DC | PRN
  Administered 2018-08-08: 08:00:00 1000 mg via INTRAVENOUS

## 2018-08-08 MED ORDER — ROCURONIUM BROMIDE 10 MG/ML (PF) SYRINGE
PREFILLED_SYRINGE | INTRAVENOUS | Status: AC
  Filled 2018-08-08: qty 10

## 2018-08-08 MED ORDER — PROPOFOL 10 MG/ML IV BOLUS
INTRAVENOUS | Status: AC
  Filled 2018-08-08: qty 20

## 2018-08-08 MED ORDER — FENTANYL CITRATE (PF) 100 MCG/2ML IJ SOLN
25.0000 ug | INTRAMUSCULAR | Status: DC | PRN
  Administered 2018-08-08 (×2): 50 ug via INTRAVENOUS

## 2018-08-08 MED ORDER — ONDANSETRON HCL 4 MG/2ML IJ SOLN
INTRAMUSCULAR | Status: AC
  Filled 2018-08-08: qty 2

## 2018-08-08 MED ORDER — 0.9 % SODIUM CHLORIDE (POUR BTL) OPTIME
TOPICAL | Status: DC | PRN
  Administered 2018-08-08: 1000 mL

## 2018-08-08 MED ORDER — LACTATED RINGERS IV SOLN
INTRAVENOUS | Status: DC | PRN
  Administered 2018-08-08: 07:00:00 via INTRAVENOUS

## 2018-08-08 MED ORDER — DEXAMETHASONE SODIUM PHOSPHATE 10 MG/ML IJ SOLN
INTRAMUSCULAR | Status: DC | PRN
  Administered 2018-08-08: 10 mg via INTRAVENOUS

## 2018-08-08 MED ORDER — FENTANYL CITRATE (PF) 100 MCG/2ML IJ SOLN
INTRAMUSCULAR | Status: AC
  Filled 2018-08-08: qty 2

## 2018-08-08 MED ORDER — PROMETHAZINE HCL 25 MG/ML IJ SOLN
6.2500 mg | INTRAMUSCULAR | Status: DC | PRN
  Administered 2018-08-08: 6.25 mg via INTRAVENOUS

## 2018-08-08 MED ORDER — MIDAZOLAM HCL 2 MG/2ML IJ SOLN
INTRAMUSCULAR | Status: AC
  Filled 2018-08-08: qty 2

## 2018-08-08 MED ORDER — MAGNESIUM SULFATE 2 GM/50ML IV SOLN: 2.0000 g | Freq: Every day | INTRAVENOUS | Status: DC | PRN

## 2018-08-08 MED ORDER — ONDANSETRON HCL 4 MG/2ML IJ SOLN
INTRAMUSCULAR | Status: DC | PRN
  Administered 2018-08-08: 4 mg via INTRAVENOUS

## 2018-08-08 MED ORDER — HYDROMORPHONE HCL 1 MG/ML IJ SOLN
0.5000 mg | INTRAMUSCULAR | Status: DC | PRN
  Administered 2018-08-08: 0.25 mg via INTRAVENOUS

## 2018-08-08 MED ORDER — PROPOFOL 10 MG/ML IV BOLUS
INTRAVENOUS | Status: DC | PRN
  Administered 2018-08-08: 100 mg via INTRAVENOUS

## 2018-08-08 MED ORDER — VANCOMYCIN HCL IN DEXTROSE 1-5 GM/200ML-% IV SOLN
1000.0000 mg | INTRAVENOUS | Status: DC
  Filled 2018-08-08: qty 200

## 2018-08-08 SURGICAL SUPPLY — 43 items
BANDAGE ACE 4X5 VEL STRL LF (GAUZE/BANDAGES/DRESSINGS) ×2 IMPLANT
BANDAGE ACE 6X5 VEL STRL LF (GAUZE/BANDAGES/DRESSINGS) IMPLANT
BANDAGE ELASTIC 4 VELCRO ST LF (GAUZE/BANDAGES/DRESSINGS) ×1 IMPLANT
BANDAGE ESMARK 6X9 LF (GAUZE/BANDAGES/DRESSINGS) IMPLANT
BLADE SAW GIGLI 510 (BLADE) ×2 IMPLANT
BNDG CMPR 9X6 STRL LF SNTH (GAUZE/BANDAGES/DRESSINGS)
BNDG ESMARK 6X9 LF (GAUZE/BANDAGES/DRESSINGS)
BNDG GAUZE ELAST 4 BULKY (GAUZE/BANDAGES/DRESSINGS) ×1 IMPLANT
CANISTER SUCT 3000ML PPV (MISCELLANEOUS) ×2 IMPLANT
CLIP LIGATING EXTRA MED SLVR (CLIP) ×2 IMPLANT
CLIP LIGATING EXTRA SM BLUE (MISCELLANEOUS) ×2 IMPLANT
COVER SURGICAL LIGHT HANDLE (MISCELLANEOUS) ×4 IMPLANT
COVER WAND RF STERILE (DRAPES) ×2 IMPLANT
CUFF TOURNIQUET SINGLE 34IN LL (TOURNIQUET CUFF) IMPLANT
CUFF TOURNIQUET SINGLE 44IN (TOURNIQUET CUFF) IMPLANT
DRAIN SNY 10X20 3/4 PERF (WOUND CARE) IMPLANT
DRAPE HALF SHEET 40X57 (DRAPES) ×2 IMPLANT
DRAPE ORTHO SPLIT 77X108 STRL (DRAPES) ×4
DRAPE SURG ORHT 6 SPLT 77X108 (DRAPES) ×2 IMPLANT
ELECT REM PT RETURN 9FT ADLT (ELECTROSURGICAL) ×2
ELECTRODE REM PT RTRN 9FT ADLT (ELECTROSURGICAL) ×1 IMPLANT
EVACUATOR SILICONE 100CC (DRAIN) IMPLANT
GAUZE SPONGE 4X4 12PLY STRL (GAUZE/BANDAGES/DRESSINGS) ×2 IMPLANT
GAUZE XEROFORM 5X9 LF (GAUZE/BANDAGES/DRESSINGS) ×2 IMPLANT
GLOVE SS BIOGEL STRL SZ 7.5 (GLOVE) ×1 IMPLANT
GLOVE SUPERSENSE BIOGEL SZ 7.5 (GLOVE) ×1
GOWN STRL REUS W/ TWL LRG LVL3 (GOWN DISPOSABLE) ×3 IMPLANT
GOWN STRL REUS W/TWL LRG LVL3 (GOWN DISPOSABLE) ×6
KIT BASIN OR (CUSTOM PROCEDURE TRAY) ×2 IMPLANT
KIT TURNOVER KIT B (KITS) ×2 IMPLANT
NS IRRIG 1000ML POUR BTL (IV SOLUTION) ×2 IMPLANT
PACK GENERAL/GYN (CUSTOM PROCEDURE TRAY) ×2 IMPLANT
PAD ARMBOARD 7.5X6 YLW CONV (MISCELLANEOUS) ×4 IMPLANT
PADDING CAST COTTON 6X4 STRL (CAST SUPPLIES) IMPLANT
STAPLER VISISTAT 35W (STAPLE) ×3 IMPLANT
STOCKINETTE IMPERVIOUS LG (DRAPES) ×2 IMPLANT
SUT ETHILON 3 0 PS 1 (SUTURE) IMPLANT
SUT VIC AB 0 CT1 18XCR BRD 8 (SUTURE) ×2 IMPLANT
SUT VIC AB 0 CT1 8-18 (SUTURE) ×6
SUT VICRYL AB 2 0 TIES (SUTURE) ×2 IMPLANT
TOWEL GREEN STERILE (TOWEL DISPOSABLE) ×4 IMPLANT
UNDERPAD 30X30 (UNDERPADS AND DIAPERS) ×2 IMPLANT
WATER STERILE IRR 1000ML POUR (IV SOLUTION) ×2 IMPLANT

## 2018-08-08 NOTE — Anesthesia Postprocedure Evaluation (Signed)
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 )     Patient location during evaluation: PACU Anesthesia Type: General Level of consciousness: sedated Pain management: pain level controlled Vital Signs Assessment: post-procedure vital signs reviewed and stable Respiratory status: spontaneous breathing and respiratory function stable Cardiovascular status: stable Postop Assessment: no apparent nausea or vomiting Anesthetic complications: yes Anesthetic complication details: PONV   Last Vitals:  Vitals:   08/08/18 1008 08/08/18 1200  BP: 129/83 (!) 141/83  Pulse: 84 87  Resp: 15 (!) 23  Temp: 37.3 C   SpO2: 97% 97%    Last Pain:  Vitals:   08/08/18 1008  TempSrc: Axillary  PainSc: Asleep                 SINGER,JAMES DANIEL

## 2018-08-08 NOTE — Progress Notes (Signed)
OT Cancellation Note  Patient Details Name: Natasha Frederick MRN: [id number] DOB: 05-20-1955   Cancelled Treatment:    Reason Eval/Treat Not Completed: Patient at procedure or test/ unavailable(sx for BKA)  Evern Bio Hilliard 08/08/2018, 8:16 AM   Sherryl Manges OTR/L Acute Rehabilitation Services Pager: [phone] Office: [phone]

## 2018-08-08 NOTE — Progress Notes (Signed)
Physical medicine rehab consult requested chart reviewed.  Patient status post right BKA this morning 08/08/2018 at 9:40 AM.  Will await therapy evaluations and follow-up with appropriate recommendations

## 2018-08-08 NOTE — Anesthesia Procedure Notes (Signed)
Procedure Name: Intubation Date/Time: 08/08/2018 7:00 AM Performed by: Elliot Dally, CRNA Pre-anesthesia Checklist: Patient identified, Emergency Drugs available, Suction available and Patient being monitored Patient Re-evaluated:Patient Re-evaluated prior to induction Oxygen Delivery Method: Circle System Utilized Preoxygenation: Pre-oxygenation with 100% oxygen Induction Type: IV induction Laryngoscope Size: Miller and 2 Grade View: Grade III Tube type: Oral Tube size: 7.0 mm Number of attempts: 1 Airway Equipment and Method: Stylet and Oral airway Placement Confirmation: ETT inserted through vocal cords under direct vision,  positive ETCO2 and breath sounds checked- equal and bilateral Secured at: 21 cm Tube secured with: Tape Dental Injury: Teeth and Oropharynx as per pre-operative assessment  Difficulty Due To: Difficulty was anticipated, Difficult Airway- due to dentition and Difficult Airway- due to anterior larynx Comments: DL x1, miller 3, grade 3 view, no attempt to pass ETT. Second DL, miller 2 by Dr. Krista Blue, successfully passed ETT.

## 2018-08-08 NOTE — Interval H&P Note (Signed)
History and Physical Interval Note:  08/08/2018 7:21 AM  Natasha Frederick  has presented today for surgery, with the diagnosis of CRITICAL LIMB ISCHEMIA RIGHT LOWER EXTREMITY.  The various methods of treatment have been discussed with the patient and family. 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  The patient's history has been reviewed, patient examined, no change in status, stable for surgery.  I have reviewed the patient's chart and labs.  Questions were answered to the patient's satisfaction.     Gretta Began

## 2018-08-08 NOTE — Progress Notes (Signed)
Patient ID: Natasha Frederick, female   DOB: 11/21/1955, 63 y.o.   MRN: [id number]    Day of Surgery  Subjective: Sleeping, post op from BKA.  Hasn't eaten today  Objective: Vital signs in last 24 hours: Temp:  [97.3 F (36.3 C)-99.1 F (37.3 C)] 99.1 F (37.3 C) (05/01 1008) Pulse Rate:  [82-92] 87 (05/01 1200) Resp:  [15-24] 23 (05/01 1200) BP: (115-141)/(62-83) 141/83 (05/01 1200) SpO2:  [88 %-98 %] 97 % (05/01 1200) Last BM Date: 08/07/18  Intake/Output from previous day: 04/30 0701 - 05/01 0700 In: 1144.4 [P.O.:480; I.V.:664.4] Out: 237 [Urine:226; Drains:10; Stool:1] Intake/Output this shift: Total I/O In: 650 [P.O.:50; I.V.:600] Out: 50 [Blood:50]  PE: Heart: regular Lungs: CTAB Abd: soft, minimally tender, +BS, JP drain with minimal serous output, incisions c/d/i Ext: +2 pedal pulse in left foot  Lab Results:  Recent Labs    08/07/18 1156 08/08/18 0242  WBC 21.9* 18.7*  HGB 9.0* 8.5*  HCT 27.6* 25.7*  PLT 627* 683*   BMET Recent Labs    08/07/18 1156 08/08/18 0242  NA 133* 135  K 3.5 3.6  CL 97* 101  CO2 24 24  GLUCOSE 112* 97  BUN 6* 7*  CREATININE 0.43* 0.46  CALCIUM 8.1* 7.9*   PT/INR No results for input(s): LABPROT, INR in the last 72 hours. CMP     Component Value Date/Time   NA 135 08/08/2018 0242   K 3.6 08/08/2018 0242   CL 101 08/08/2018 0242   CO2 24 08/08/2018 0242   GLUCOSE 97 08/08/2018 0242   BUN 7 (L) 08/08/2018 0242   CREATININE 0.46 08/08/2018 0242   CALCIUM 7.9 (L) 08/08/2018 0242   PROT 5.8 (L) 07/30/2018 2001   ALBUMIN 3.2 (L) 07/30/2018 2001   AST 44 (H) 07/30/2018 2001   ALT 23 07/30/2018 2001   ALKPHOS 52 07/30/2018 2001   BILITOT 0.8 07/30/2018 2001   GFRNONAA >60 08/08/2018 0242   GFRAA >60 08/08/2018 0242   Lipase     Component Value Date/Time   LIPASE 130 (H) 07/30/2018 2001       Studies/Results: Ct Angio Chest Aorta W/cm &/or Wo/cm  Result Date: 08/08/2018 CLINICAL DATA:  Status post bilateral  popliteal and tibial embolectomy and right below-knee amputation for embolic occlusions and ischemia. Splenic infarction. EXAM: CT ANGIOGRAPHY CHEST WITH CONTRAST TECHNIQUE: Multidetector CT imaging of the chest was performed using the standard protocol during bolus administration of intravenous contrast. Multiplanar CT image reconstructions and MIPs were obtained to evaluate the vascular anatomy. CONTRAST:  OMNIPAQUE IOHEXOL 350 MG/ML SOLN COMPARISON:  CTA of the abdominal aorta with bilateral runoff on 08/06/2018. FINDINGS: Cardiovascular: The thoracic aorta is normal in caliber and demonstrates mild calcified plaque at the level of the aortic arch. No evidence of significant mural thrombus in the thoracic aorta or vasculitis. No aortic dissection. Proximal great vessels are normally patent. Central pulmonary arteries are normal in caliber. The heart size is within normal limits. No obvious intracardiac thrombus or mass by CT. No pericardial fluid identified. Calcified plaque is noted in the coronary artery tree in the distribution of the LAD and left circumflex coronary arteries. The right coronary artery is blurred by motion. Mediastinum/Nodes: No enlarged mediastinal, hilar, or axillary lymph nodes. Thyroid gland, trachea, and esophagus demonstrate no significant findings. Lungs/Pleura: There is atelectasis in both lower lobes. There is no evidence of pulmonary edema, consolidation, pneumothorax, nodule or pleural fluid. Small left pleural effusion and trace amount of right pleural fluid.  Upper Abdomen: The visualized upper abdomen demonstrates complete infarction of the spleen as noted on the prior CTA which was not present on the CT dated 07/30/2018. The spleen is correspondingly enlarged and diffusely necrotic and liquefied without evidence of discrete splenic abscess or parenchymal gas in the necrotic spleen. Musculoskeletal: No chest wall abnormality. No acute or significant osseous findings. Review  of the MIP images confirms the above findings. IMPRESSION: 1. Mild calcified plaque at the level of the aortic arch. No evidence of thoracic aortic aneurysmal disease or mural thrombus. 2. Coronary artery disease with calcified plaque in the LAD and left circumflex coronary arteries. The RCA is blurred by motion. 3. Small left pleural effusion and trace right pleural fluid. 4. 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 Electronically Signed   By: Irish Lack M.D.   On: 08/08/2018 11:43    Anti-infectives: Anti-infectives (From admission, onward)   Start     Dose/Rate Route Frequency Ordered Stop   08/08/18 2000  vancomycin (VANCOCIN) IVPB 1000 mg/200 mL premix     1,000 mg 200 mL/hr over 60 Minutes Intravenous Every 12 hours 08/08/18 1004 08/09/18 1959   08/08/18 0830  vancomycin (VANCOCIN) IVPB 1000 mg/200 mL premix  Status:  Discontinued     1,000 mg 200 mL/hr over 60 Minutes Intravenous To Surgery 08/08/18 0828 08/08/18 1004   08/08/18 0715  vancomycin (VANCOCIN) IVPB 1000 mg/200 mL premix     1,000 mg 200 mL/hr over 60 Minutes Intravenous To ShortStay Surgical 08/07/18 1624 08/09/18 0715   07/31/18 0600  clindamycin (CLEOCIN) IVPB 900 mg     900 mg 100 mL/hr over 30 Minutes Intravenous On call to O.R. 07/30/18 2208 07/30/18 2352   07/31/18 0600  gentamicin (GARAMYCIN) 310 mg in dextrose 5 % 100 mL IVPB     5 mg/kg  61.2 kg 107.8 mL/hr over 60 Minutes Intravenous On call to O.R. 07/30/18 2208 07/31/18 0006   07/31/18 0600  cefTRIAXone (ROCEPHIN) 2 g in sodium chloride 0.9 % 100 mL IVPB    Note to Pharmacy:  Pharmacy may adjust dosing strength / duration / interval for maximal efficacy   2 g 200 mL/hr over 30 Minutes Intravenous Daily 07/31/18 0258 08/04/18 0616   07/31/18 0400  metroNIDAZOLE (FLAGYL) IVPB 500 mg     500 mg 100 mL/hr over 60 Minutes Intravenous Every 6 hours 07/31/18 0258 08/01/18 0546   07/31/18 0003  clindamycin (CLEOCIN) 900 mg,  gentamicin (GARAMYCIN) 240 mg in sodium chloride 0.9 % 1,000 mL for intraperitoneal lavage  Status:  Discontinued       As needed 07/31/18 0003 07/31/18 0052   07/30/18 2249  clindamycin (CLEOCIN) 900 MG/50ML IVPB    Note to Pharmacy:  Mirian Mo   : cabinet override      07/30/18 2249 07/30/18 2322   07/30/18 2215  clindamycin (CLEOCIN) 900 mg, gentamicin (GARAMYCIN) 240 mg in sodium chloride 0.9 % 1,000 mL for intraperitoneal lavage  Status:  Discontinued    Note to Pharmacy:  Have in the  OR room for final irrigation in bowel surgery case to minimize risk of abscess/infection Pharmacy may adjust dosing strength, schedule, rate of infusion, etc as needed to optimize therapy    Irrigation To Surgery 07/30/18 2208 07/31/18 1024   07/30/18 2130  ceFEPIme (MAXIPIME) 2 g in sodium chloride 0.9 % 100 mL IVPB     2 g 200 mL/hr over 30 Minutes Intravenous  Once 07/30/18 2129 07/30/18 2232  07/30/18 2130  metroNIDAZOLE (FLAGYL) IVPB 500 mg     500 mg 100 mL/hr over 60 Minutes Intravenous  Once 07/30/18 2129 07/30/18 2306       Assessment/Plan Acute kidney disease Dehydration Huntington's disease Scoliosis Osteoarthritis Hx of LONG term NSAID use 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  Will need splenic vaccines prior to discharge BLE ischemia, POD 2, s/p Bilateral popliteal and tibial embolectomies. Left lateral and medial fasciotomy, Dr. Tawanna Coolerodd Early POD 0, s/p right BKA -care per vascular surgery.  Appreciate their assistance Perforated gastric ulcer S/p Omental [name] patich, diagnostic laparoscopy, washout/drainage of intraabdominal abscess, 07/30/18, Dr. Viviann SpareSteven GrossPOD#9 -soft diet.  If tolerates this and drain remains stable, can likely remove tomorrow. -OOB to chair as able given issues in her legs  WJX:BJYNFEN:soft diet ID: Maxipime 4/22 x 1; Flagyl 4/22 - 4/24;Rocephin 4/23>>day5, all abx completed at this time DVT: Lovenox  Follow up:  Dr. Karie SodaSteven Gross POC: Spouse Rickard RhymesLewis [place]ols[place] [phone]   LOS: 9 days    Letha CapeKelly E Osborne , [facility] 08/08/2018, 3:04 PM Pager: [phone]

## 2018-08-08 NOTE — Op Note (Signed)
    OPERATIVE REPORT  DATE OF SURGERY: 08/08/2018  PATIENT: Natasha Frederick, 63 y.o. female MRN: [id number]  DOB: 1955/05/30  PRE-OPERATIVE DIAGNOSIS: Nonviable right lower extremity  POST-OPERATIVE DIAGNOSIS:  Same  PROCEDURE: Right below-knee amputation  SURGEON:  Gretta Began, M.D.  PHYSICIAN ASSISTANT: Darlin Coco, PA-C  ANESTHESIA: General  EBL: per anesthesia record  Total I/O In: 600 [I.V.:600] Out: 50 [Blood:50]  BLOOD ADMINISTERED: none  DRAINS: none  SPECIMEN: none  COUNTS CORRECT:  YES  PATIENT DISPOSITION:  PACU - hemodynamically stable  PROCEDURE DETAILS: Patient was taken the operating placed to position the area the right leg prepped draped you sterile fashion.  Incision was made below the tibial prominence and carried down through the subcutaneous tissue to the level of the tibia.  The posterior based muscle flap was left intact with the skin.  Incision was made into the popliteal exposure from 2 days ago.  The posterior gastrocnemius and soleus muscle all appeared to be viable.  The anterior muscle on the anterior dural surface was dusky.  The muscle was divided with electrocautery in line with the skin incision.  The anterior tibial neurovascular bundle was ligated and divided.  The periosteum was elevated on the fibula and the fibula was divided with bone shears.  Periosteum was elevated and on the tibia and the tibia was divided with a Gigli saw.  The popliteal vessels were ligated and divided.  The patient did have a popliteal pulse above the knee but there was no flow at this level.  The wounds irrigated with saline.  Hemostasis with cautery.  Wounds were closed with interrupted 0 Vicryl figure-of-eight sutures to close the anterior fascia to the posterior fascia.  Skin was closed with skin staples.  Sterile dressing was applied and the patient was transferred to the recovery room in stable condition  I called the patient's husband by telephone since he is not  able to visit due to COVID.  Explained the surgery she certainly had borderline viability at this level and has a significant risk for requiring eventual above-knee amputation.  Also discussed the fact that her has no motor or sensory function in her left foot and certainly is at continued risk for amputation at this level as well.   Larina Earthly, M.D., [facility] 08/08/2018 9:40 AM

## 2018-08-08 NOTE — Transfer of Care (Signed)
Immediate Anesthesia Transfer of Care Note  Patient: Natasha Frederick  Procedure(s) Performed: AMPUTATION BELOW KNEE RIGHT LOWER EXTREMITY (Right )  Patient Location: PACU  Anesthesia Type:General  Level of Consciousness: drowsy  Airway & Oxygen Therapy: Patient Spontanous Breathing and Patient connected to face mask oxygen  Post-op Assessment: Report given to RN and Post -op Vital signs reviewed and stable  Post vital signs: Reviewed and stable  Last Vitals:  Vitals Value Taken Time  BP 123/68 08/08/2018  9:14 AM  Temp    Pulse 81 08/08/2018  9:18 AM  Resp 20 08/08/2018  9:18 AM  SpO2 97 % 08/08/2018  9:18 AM  Vitals shown include unvalidated device data.  Last Pain:  Vitals:   08/08/18 0545  TempSrc: Oral  PainSc: 0-No pain         Complications: No apparent anesthesia complications

## 2018-08-08 NOTE — Anesthesia Preprocedure Evaluation (Addendum)
Anesthesia Evaluation  Patient identified by MRN, date of birth, ID band Patient awake    Reviewed: Allergy & Precautions, NPO status , Patient's Chart, lab work & pertinent test results  History of Anesthesia Complications Negative for: history of anesthetic complications  Airway Mallampati: II  TM Distance: >3 FB Neck ROM: Full    Dental no notable dental hx. (+) Dental Advisory Given   Pulmonary former smoker,    Pulmonary exam normal        Cardiovascular + Peripheral Vascular Disease  Normal cardiovascular exam  IMPRESSIONS    1. The left ventricle has low normal systolic function, with an ejection fraction of 50-55%. The cavity size was normal. Left ventricular diastolic Doppler parameters are consistent with pseudonormalization.  2. The mitral valve is grossly normal.  3. The tricuspid valve is grossly normal.  4. The aortic valve is grossly normal.  5. The aortic root and ascending aorta are normal in size and structure.  6. The interatrial septum was not assessed.    Neuro/Psych    GI/Hepatic Neg liver ROS, PUD,   Endo/Other    Renal/GU Renal disease     Musculoskeletal  (+) Arthritis ,   Abdominal   Peds  Hematology   Anesthesia Other Findings   Reproductive/Obstetrics                           Anesthesia Physical  Anesthesia Plan  ASA: III  Anesthesia Plan: General   Post-op Pain Management:    Induction: Intravenous  PONV Risk Score and Plan: 4 or greater and Ondansetron, Dexamethasone, Scopolamine patch - Pre-op and Diphenhydramine  Airway Management Planned: Oral ETT  Additional Equipment:   Intra-op Plan:   Post-operative Plan: Extubation in OR  Informed Consent: I have reviewed the patients History and Physical, chart, labs and discussed the procedure including the risks, benefits and alternatives for the proposed anesthesia with the patient or  authorized representative who has indicated his/her understanding and acceptance.     Dental advisory given  Plan Discussed with: CRNA and Anesthesiologist  Anesthesia Plan Comments:        Anesthesia Quick Evaluation

## 2018-08-08 NOTE — Progress Notes (Signed)
Spoke with husband and he would like a Child psychotherapist to call him and take about Rehab. Center for his wife. Can we please have an order to set this up?

## 2018-08-09 ENCOUNTER — Encounter (HOSPITAL_COMMUNITY): Payer: Self-pay | Admitting: Vascular Surgery

## 2018-08-09 LAB — BASIC METABOLIC PANEL
Anion gap: 9 (ref 5–15)
BUN: 8 mg/dL (ref 8–23)
CO2: 23 mmol/L (ref 22–32)
Calcium: 8 mg/dL — ABNORMAL LOW (ref 8.9–10.3)
Chloride: 102 mmol/L (ref 98–111)
Creatinine, Ser: 0.45 mg/dL (ref 0.44–1.00)
GFR calc Af Amer: >60 mL/min (ref >60)
GFR calc non Af Amer: >60 mL/min (ref >60)
Glucose, Bld: 115 mg/dL — ABNORMAL HIGH (ref 70–99)
Potassium: 4 mmol/L (ref 3.5–5.1)
Sodium: 134 mmol/L — ABNORMAL LOW (ref 135–145)

## 2018-08-09 LAB — CBC
HCT: 26.9 % — ABNORMAL LOW (ref 36.0–46.0)
Hemoglobin: 8.5 g/dL — ABNORMAL LOW (ref 12.0–15.0)
MCH: 28 pg (ref 26.0–34.0)
MCHC: 31.6 g/dL (ref 30.0–36.0)
MCV: 88.5 fL (ref 80.0–100.0)
Platelets: 504 10*3/uL — ABNORMAL HIGH (ref 150–400)
RBC: 3.04 MIL/uL — ABNORMAL LOW (ref 3.87–5.11)
RDW: 13.8 % (ref 11.5–15.5)
WBC: 19.3 10*3/uL — ABNORMAL HIGH (ref 4.0–10.5)
nRBC: 0 % (ref 0.0–0.2)

## 2018-08-09 MED ORDER — POLYETHYLENE GLYCOL 3350 17 G PO PACK
17.0000 g | PACK | Freq: Every day | ORAL | Status: DC
  Administered 2018-08-12 – 2018-08-13 (×2): 17 g via ORAL
  Filled 2018-08-09 (×3): qty 1

## 2018-08-09 MED ORDER — SCOPOLAMINE 1 MG/3DAYS TD PT72
1.0000 | MEDICATED_PATCH | TRANSDERMAL | Status: DC
  Filled 2018-08-09 (×2): qty 1

## 2018-08-09 NOTE — Progress Notes (Signed)
Per Dr. Arbie Cookey request consulted to see if pt would be allowed husband to visit. Per Darcus Pester and DD Joy patient does not meet criteria for visitor exception. RN Barth Kirks made aware. Thomas Hoff, RN

## 2018-08-09 NOTE — Progress Notes (Signed)
Physical Therapy Reevaluation and Treatment Patient Details Name: Natasha BeckmannJudith Frederick MRN: [id number] DOB: 08/29/1955 Today's Date: 08/09/2018    History of Present Illness Pt is a 63 year old female s/p lap Graham patch of perforated gastric ulcer on 07/30/18 by Dr. Michaell CowingGross.  [facility] bilateral popliteal and tibial embolectomies as well as left fasciotomy on 4/29. S/p R BKA 5/1  PMHx significant for Huntington's disease, scoliosis, back surgery    PT Comments    Pt is limited in safe mobility by decreased sensation and motor response in L LE, as well as decreased safety awareness, and decreased strength and endurance. Pt is currently s/p R BKA and is likely to require L BKA as well. Pt currently requires minA for bed mobility and maxAx2 for lateral scoot transfer to drop arm recliner. Pt is motivate to work with therapy and would like to regain independence, as well as having 24 hour support from her husband making her a good candidate for CIR level rehab before returning home. PT will continue to follow acutely.    Follow Up Recommendations  Supervision/Assistance - 24 hour;CIR     Equipment Recommendations  Wheelchair (measurements PT);Wheelchair cushion (measurements PT)    Recommendations for Other Services       Precautions / Restrictions Precautions Precautions: Fall Precaution Comments: 1 JP drain, L LE numbness and drop foot s/p fasciatomy  Restrictions Weight Bearing Restrictions: Yes RLE Weight Bearing: Non weight bearing    Mobility  Bed Mobility Overal bed mobility: Needs Assistance Bed Mobility: Supine to Sit     Supine to sit: Min assist     General bed mobility comments: minA for pulling up against therapist, able to move LE to EoB without assist  Transfers Overall transfer level: Needs assistance Equipment used: None Transfers: Lateral/Scoot Transfers          Lateral/Scoot Transfers: +2 physical assistance;Max assist General  transfer comment: Pt impulsive with movement, requires 2 attempts for scooting to recliner, first attempt with modAx1 for lateral scoot of hips to L, vc for power up with UE on 3, pt unable to coordinate assist. Pt with hips too far towards EoB and returned to supine. On second attempt pt given maxAx2 for pad scoot transfer to recliner        Balance Overall balance assessment: Needs assistance Sitting-balance support: No upper extremity supported;Feet supported Sitting balance-Leahy Scale: Fair Sitting balance - Comments: min guard for safety Postural control: Posterior lean     Standing balance comment: unable s/p R BKA                            Cognition Arousal/Alertness: Awake/alert Behavior During Therapy: WFL for tasks assessed/performed Overall Cognitive Status: Within Functional Limits for tasks assessed Area of Impairment: Safety/judgement;Following commands                       Following Commands: Follows one step commands with increased time Safety/Judgement: Decreased awareness of safety     General Comments: pt had a difficult time following and performing tasks         General Comments General comments (skin integrity, edema, etc.): L foot with blisters on dorsum, covered and donned sock for transfer, removed sock and blisters in tact after transfer      Pertinent Vitals/Pain Pain Assessment: 0-10 Pain Score: 6  Pain Location: abdomen and R residual limb Pain Descriptors /  Indicators: Sore           PT Goals (current goals can now be found in the care plan section) Acute Rehab PT Goals Patient Stated Goal: none stated PT Goal Formulation: Patient unable to participate in goal setting Time For Goal Achievement: 08/15/18 Potential to Achieve Goals: Fair Progress towards PT goals: Not progressing toward goals - comment(s/p R BKA)    Frequency    Min 3X/week      PT Plan Discharge plan needs to be updated       AM-PAC  PT "6 Clicks" Mobility   Outcome Measure  Help needed turning from your back to your side while in a flat bed without using bedrails?: A Little Help needed moving from lying on your back to sitting on the side of a flat bed without using bedrails?: A Lot Help needed moving to and from a bed to a chair (including a wheelchair)?: A Lot Help needed standing up from a chair using your arms (e.g., wheelchair or bedside chair)?: A Lot Help needed to walk in hospital room?: A Lot Help needed climbing 3-5 steps with a railing? : Total 6 Click Score: 12    End of Session Equipment Utilized During Treatment: Gait belt Activity Tolerance: Patient tolerated treatment well Patient left: in chair;with call bell/phone within reach;with chair alarm set Nurse Communication: Mobility status;Need for lift equipment PT Visit Diagnosis: Muscle weakness (generalized) (M62.81);Other abnormalities of gait and mobility (R26.89)     Time: 3567-0141 PT Time Calculation (min) (ACUTE ONLY): 27 min  Charges:  $Therapeutic Activity: 8-22 mins                      B. Beverely Risen PT, DPT Acute Rehabilitation Services Pager [phone] Office [phone]    Elon Alas Fleet 08/09/2018, 9:53 AM

## 2018-08-09 NOTE — Plan of Care (Signed)
Care plans reviewed and patient is progressing.  

## 2018-08-09 NOTE — Progress Notes (Signed)
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 No abdominal pain, N/V. Tolerating diet. Passing flatus and had 3 watery BM's this AM per nurse. Miralax was held yesterday and today. Minimal drain output overnight.   Objective: Vital signs in last 24 hours: Temp:  [98.4 F (36.9 C)-98.9 F (37.2 C)] 98.9 F (37.2 C) (05/02 0820) Pulse Rate:  [79-91] 85 (05/02 0820) Resp:  [17-24] 24 (05/02 0820) BP: (118-148)/(68-104) 128/97 (05/02 0820) SpO2:  [90 %-97 %] 91 % (05/02 0820) Last BM Date: 08/08/18  Intake/Output from previous day: 05/01 0701 - 05/02 0700 In: 1118.7 [P.O.:290; I.V.:628.7; IV Piggyback:200] Out: 555 [Urine:500; Drains:5; Blood:50] Intake/Output this shift: Total I/O In: -  Out: 300 [Urine:300]  PE: Gen: Awake and alert, NAD Heart: regular Lungs: CTAB Abd: soft, nontender, ND, +BS, JP drain with minimal SS output overnight. This was removed. incisions c/d/i Ext: +2 pedal pulse in left foot  Lab Results:  Recent Labs    08/08/18 0242 08/09/18 0205  WBC 18.7* 19.3*  HGB 8.5* 8.5*  HCT 25.7* 26.9*  PLT 683* 504*   BMET Recent Labs    08/08/18 0242 08/09/18 0205  NA 135 134*  K 3.6 4.0  CL 101 102  CO2 24 23  GLUCOSE 97 115*  BUN 7* 8  CREATININE 0.46 0.45  CALCIUM 7.9* 8.0*   PT/INR No results for input(s): LABPROT, INR in the last 72 hours. CMP     Component Value Date/Time   NA 134 (L) 08/09/2018 0205   K 4.0 08/09/2018 0205   CL 102 08/09/2018 0205   CO2 23 08/09/2018 0205   GLUCOSE 115 (H) 08/09/2018 0205   BUN 8 08/09/2018 0205   CREATININE 0.45 08/09/2018 0205   CALCIUM 8.0 (L) 08/09/2018 0205   PROT 5.8 (L) 07/30/2018 2001   ALBUMIN 3.2 (L) 07/30/2018 2001   AST 44 (H) 07/30/2018 2001   ALT 23 07/30/2018 2001   ALKPHOS 52 07/30/2018 2001   BILITOT 0.8 07/30/2018 2001   GFRNONAA >60 08/09/2018 0205   GFRAA >60 08/09/2018 0205   Lipase     Component Value Date/Time   LIPASE 130 (H) 07/30/2018 2001        Studies/Results: Ct Angio Chest Aorta W/cm &/or Wo/cm  Result Date: 08/08/2018 CLINICAL DATA:  Status post bilateral popliteal and tibial embolectomy and right below-knee amputation for embolic occlusions and ischemia. Splenic infarction. EXAM: CT ANGIOGRAPHY CHEST WITH CONTRAST TECHNIQUE: Multidetector CT imaging of the chest was performed using the standard protocol during bolus administration of intravenous contrast. Multiplanar CT image reconstructions and MIPs were obtained to evaluate the vascular anatomy. CONTRAST:  100mL OMNIPAQUE IOHEXOL 350 MG/ML SOLN COMPARISON:  CTA of the abdominal aorta with bilateral runoff on 08/06/2018. FINDINGS: Cardiovascular: The thoracic aorta is normal in caliber and demonstrates mild calcified plaque at the level of the aortic arch. No evidence of significant mural thrombus in the thoracic aorta or vasculitis. No aortic dissection. Proximal great vessels are normally patent. Central pulmonary arteries are normal in caliber. The heart size is within normal limits. No obvious intracardiac thrombus or mass by CT. No pericardial fluid identified. Calcified plaque is noted in the coronary artery tree in the distribution of the LAD and left circumflex coronary arteries. The right coronary artery is blurred by motion. Mediastinum/Nodes: No enlarged mediastinal, hilar, or axillary lymph nodes. Thyroid gland, trachea, and esophagus demonstrate no significant findings. Lungs/Pleura: There is atelectasis in both lower lobes. There is no evidence of pulmonary  edema, consolidation, pneumothorax, nodule or pleural fluid. Small left pleural effusion and trace amount of right pleural fluid. Upper Abdomen: The visualized upper abdomen demonstrates complete infarction of the spleen as noted on the prior CTA which was not present on the CT dated 07/30/2018. The spleen is correspondingly enlarged and diffusely necrotic and liquefied without evidence of discrete splenic abscess or  parenchymal gas in the necrotic spleen. Musculoskeletal: No chest wall abnormality. No acute or significant osseous findings. Review of the MIP images confirms the above findings. IMPRESSION: 1. Mild calcified plaque at the level of the aortic arch. No evidence of thoracic aortic aneurysmal disease or mural thrombus. 2. Coronary artery disease with calcified plaque in the LAD and left circumflex coronary arteries. The RCA is blurred by motion. 3. Small left pleural effusion and trace right pleural fluid. 4. 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 Electronically Signed   By: Irish Lack M.D.   On: 08/08/2018 11:43    Anti-infectives: Anti-infectives (From admission, onward)   Start     Dose/Rate Route Frequency Ordered Stop   08/08/18 2000  vancomycin (VANCOCIN) IVPB 1000 mg/200 mL premix     1,000 mg 200 mL/hr over 60 Minutes Intravenous Every 12 hours 08/08/18 1004 08/09/18 0925   08/08/18 0830  vancomycin (VANCOCIN) IVPB 1000 mg/200 mL premix  Status:  Discontinued     1,000 mg 200 mL/hr over 60 Minutes Intravenous To Surgery 08/08/18 0828 08/08/18 1004   08/08/18 0715  vancomycin (VANCOCIN) IVPB 1000 mg/200 mL premix     1,000 mg 200 mL/hr over 60 Minutes Intravenous To ShortStay Surgical 08/07/18 1624 08/09/18 0715   07/31/18 0600  clindamycin (CLEOCIN) IVPB 900 mg     900 mg 100 mL/hr over 30 Minutes Intravenous On call to O.R. 07/30/18 2208 07/30/18 2352   07/31/18 0600  gentamicin (GARAMYCIN) 310 mg in dextrose 5 % 100 mL IVPB     5 mg/kg  61.2 kg 107.8 mL/hr over 60 Minutes Intravenous On call to O.R. 07/30/18 2208 07/31/18 0006   07/31/18 0600  cefTRIAXone (ROCEPHIN) 2 g in sodium chloride 0.9 % 100 mL IVPB    Note to Pharmacy:  Pharmacy may adjust dosing strength / duration / interval for maximal efficacy   2 g 200 mL/hr over 30 Minutes Intravenous Daily 07/31/18 0258 08/04/18 0616   07/31/18 0400  metroNIDAZOLE (FLAGYL) IVPB 500 mg     500  mg 100 mL/hr over 60 Minutes Intravenous Every 6 hours 07/31/18 0258 08/01/18 0546   07/31/18 0003  clindamycin (CLEOCIN) 900 mg, gentamicin (GARAMYCIN) 240 mg in sodium chloride 0.9 % 1,000 mL for intraperitoneal lavage  Status:  Discontinued       As needed 07/31/18 0003 07/31/18 0052   07/30/18 2249  clindamycin (CLEOCIN) 900 MG/50ML IVPB    Note to Pharmacy:  Mirian Mo   : cabinet override      07/30/18 2249 07/30/18 2322   07/30/18 2215  clindamycin (CLEOCIN) 900 mg, gentamicin (GARAMYCIN) 240 mg in sodium chloride 0.9 % 1,000 mL for intraperitoneal lavage  Status:  Discontinued    Note to Pharmacy:  Have in the  OR room for final irrigation in bowel surgery case to minimize risk of abscess/infection Pharmacy may adjust dosing strength, schedule, rate of infusion, etc as needed to optimize therapy    Irrigation To Surgery 07/30/18 2208 07/31/18 1024   07/30/18 2130  ceFEPIme (MAXIPIME) 2 g in sodium chloride 0.9 % 100 mL IVPB  2 g 200 mL/hr over 30 Minutes Intravenous  Once 07/30/18 2129 07/30/18 2232   07/30/18 2130  metroNIDAZOLE (FLAGYL) IVPB 500 mg     500 mg 100 mL/hr over 60 Minutes Intravenous  Once 07/30/18 2129 07/30/18 2306       Assessment/Plan Acute kidney disease Dehydration Huntington's disease Scoliosis Osteoarthritis Hx of LONG term NSAID use 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  Will need splenic vaccines prior to discharge BLE ischemia, POD 2, s/pBilateral popliteal and tibial embolectomies. Left lateral and medial fasciotomy, Dr. Tawanna Cooler Early POD 1, s/p right BKA -care per vascular surgery.  Appreciate their assistance Perforated gastric ulcer S/p Omental [name] patich, diagnostic laparoscopy, washout/drainage of intraabdominal abscess, 07/30/18, Dr. Viviann Spare GrossPOD#10 -soft diet.  - JP drain removed -OOB to chair as able given issues in her legs  SAY:TKZS diet, K 4.0 ID: Maxipime 4/22 x 1; Flagyl 4/22  - 4/24;Rocephin 4/23 - 4/27. Vanc 4/30 - 5/2 DVT: Lovenox  Follow up: Dr. Karie Soda POC: Spouse Rickard Rhymes [phone]  Dispo: CIR consult pending.    LOS: 10 days    Jacinto Halim , [facility] Surgery 08/09/2018, 10:49 AM Pager: [phone]

## 2018-08-09 NOTE — Progress Notes (Addendum)
Progress Note    08/09/2018 7:51 AM 1 Day Post-Op  Subjective:  Has some pain in her right stump; cannot move her left foot and cannot feel me touching her left foot.    Afebrile  Vitals:   08/09/18 0007 08/09/18 0328  BP: 128/71 (!) 125/100  Pulse: 79 89  Resp: 20 17  Temp:  98.4 F (36.9 C)  SpO2: 95% 90%    Physical Exam: Incisions:  Right stump bandaged and is clean and dry.   Extremities:  Motor and sensation are not in tact left foot.  Left foot drop.  Left foot is warm.  CBC    Component Value Date/Time   WBC 19.3 (H) 08/09/2018 0205   RBC 3.04 (L) 08/09/2018 0205   HGB 8.5 (L) 08/09/2018 0205   HCT 26.9 (L) 08/09/2018 0205   PLT 504 (H) 08/09/2018 0205   MCV 88.5 08/09/2018 0205   MCH 28.0 08/09/2018 0205   MCHC 31.6 08/09/2018 0205   RDW 13.8 08/09/2018 0205   LYMPHSABS 0.2 (L) 07/30/2018 2001   MONOABS 0.1 07/30/2018 2001   EOSABS 0.0 07/30/2018 2001   BASOSABS 0.0 07/30/2018 2001    BMET    Component Value Date/Time   NA 134 (L) 08/09/2018 0205   K 4.0 08/09/2018 0205   CL 102 08/09/2018 0205   CO2 23 08/09/2018 0205   GLUCOSE 115 (H) 08/09/2018 0205   BUN 8 08/09/2018 0205   CREATININE 0.45 08/09/2018 0205   CALCIUM 8.0 (L) 08/09/2018 0205   GFRNONAA >60 08/09/2018 0205   GFRAA >60 08/09/2018 0205    INR    Component Value Date/Time   INR 0.93 07/09/2014 1007     Intake/Output Summary (Last 24 hours) at 08/09/2018 0751 Last data filed at 08/09/2018 0624 Gross per 24 hour  Intake 1118.68 ml  Output 555 ml  Net 563.68 ml   CTA chest 08/08/2018: IMPRESSION: 1. Mild calcified plaque at the level of the aortic arch. No evidence of thoracic aortic aneurysmal disease or mural thrombus. 2. Coronary artery disease with calcified plaque in the LAD and left circumflex coronary arteries. The RCA is blurred by motion. 3. Small left pleural effusion and trace right pleural fluid. 4. 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   Assessment/Plan:  63 y.o. female is s/p  Bilateral popliteal and tibial embolectomies.  Left lateral and medial fasciotomy 2 Days Post-Op right below knee amputation   1 Day Post-Op  -pt's right stump dressing is clean and dry-will take this down tomorrow. -pt with left foot drop and motor and sensation are not in tact.  She is most likely going to require a left leg amputation.   -CIR consulted and awaiting further recs from MD's and PT   Doreatha Massed, PA-C [facility] [phone] 08/09/2018 7:51 AM     I have examined the patient, reviewed and agree with above.  Took her dressing down from right stump.  Wound edges look great.  No evidence of ischemia.  Some tenderness as expected postop day 1.  Anterior compartment was dusky so concerned until she has complete healing.  Left foot is warm.  She has biphasic dorsalis pedis signal with normal flow into her foot.  She does have some superficial blistering on the junction between her ankle and the dorsum of her foot.  No motor or sensory function in her left foot with foot drop.  Discussed this with the patient and also her  husband by telephone this morning.  With prolonged ischemia, concerned whether she will regain any neurologic function in her left foot.  Fasciotomy incisions healing and does not look like she will require amputation at this point.  CT of her chest yesterday shows no evidence of source for embolus.  Has had progressive infarction of her spleen.  Still unclear as to the etiology of this embolus.  No cardiac source and no thoracic source identified.  Did have eccentric thrombus in her infrarenal aorta which could not explain splenic infarct or wedge-shaped renal infarct.  Gretta Beganodd , MD 08/09/2018 10:36 AM

## 2018-08-10 LAB — BASIC METABOLIC PANEL
Anion gap: 9 (ref 5–15)
BUN: 7 mg/dL — ABNORMAL LOW (ref 8–23)
CO2: 27 mmol/L (ref 22–32)
Calcium: 8 mg/dL — ABNORMAL LOW (ref 8.9–10.3)
Chloride: 98 mmol/L (ref 98–111)
Creatinine, Ser: 0.42 mg/dL — ABNORMAL LOW (ref 0.44–1.00)
GFR calc Af Amer: >60 mL/min (ref >60)
GFR calc non Af Amer: >60 mL/min (ref >60)
Glucose, Bld: 100 mg/dL — ABNORMAL HIGH (ref 70–99)
Potassium: 3.4 mmol/L — ABNORMAL LOW (ref 3.5–5.1)
Sodium: 134 mmol/L — ABNORMAL LOW (ref 135–145)

## 2018-08-10 LAB — CBC
HCT: 26.5 % — ABNORMAL LOW (ref 36.0–46.0)
Hemoglobin: 8.6 g/dL — ABNORMAL LOW (ref 12.0–15.0)
MCH: 28.6 pg (ref 26.0–34.0)
MCHC: 32.5 g/dL (ref 30.0–36.0)
MCV: 88 fL (ref 80.0–100.0)
Platelets: 883 10*3/uL — ABNORMAL HIGH (ref 150–400)
RBC: 3.01 MIL/uL — ABNORMAL LOW (ref 3.87–5.11)
RDW: 13.7 % (ref 11.5–15.5)
WBC: 18.8 10*3/uL — ABNORMAL HIGH (ref 4.0–10.5)
nRBC: 0 % (ref 0.0–0.2)

## 2018-08-10 NOTE — Progress Notes (Signed)
2 Days Post-Op  Subjective: S/p right BKA 5/1. No abdominal pain, N/V. Tolerating diet. Passing flatus and having loose watery bm. Miralax was therefore discontinued. Drains out  Objective: Vital signs in last 24 hours: Temp:  [98.6 F (37 C)] 98.6 F (37 C) (05/03 0416) Pulse Rate:  [87-90] 87 (05/03 0416) Resp:  [15-20] 20 (05/03 0416) BP: (120-142)/(79-94) 142/94 (05/03 0416) SpO2:  [90 %-92 %] 90 % (05/03 0416) Last BM Date: 08/09/18  Intake/Output from previous day: 05/02 0701 - 05/03 0700 In: 1413.2 [P.O.:1070; IV Piggyback:343.2] Out: 1955 [Urine:1450; Drains:5; Stool:500] Intake/Output this shift: No intake/output data recorded.  PE: Gen: Awake and alert, NAD Heart: regular Lungs: CTAB Abd: soft, nontender, ND, +BS, incision c/d/i without erythema Ext: palpable pulses DP/PT in left foot with foot drop; RLE BKA site dressed and dry  Lab Results:  Recent Labs    08/09/18 0205 08/10/18 0303  WBC 19.3* 18.8*  HGB 8.5* 8.6*  HCT 26.9* 26.5*  PLT 504* 883*   BMET Recent Labs    08/09/18 0205 08/10/18 0303  NA 134* 134*  K 4.0 3.4*  CL 102 98  CO2 23 27  GLUCOSE 115* 100*  BUN 8 7*  CREATININE 0.45 0.42*  CALCIUM 8.0* 8.0*   PT/INR No results for input(s): LABPROT, INR in the last 72 hours. CMP     Component Value Date/Time   NA 134 (L) 08/10/2018 0303   K 3.4 (L) 08/10/2018 0303   CL 98 08/10/2018 0303   CO2 27 08/10/2018 0303   GLUCOSE 100 (H) 08/10/2018 0303   BUN 7 (L) 08/10/2018 0303   CREATININE 0.42 (L) 08/10/2018 0303   CALCIUM 8.0 (L) 08/10/2018 0303   PROT 5.8 (L) 07/30/2018 2001   ALBUMIN 3.2 (L) 07/30/2018 2001   AST 44 (H) 07/30/2018 2001   ALT 23 07/30/2018 2001   ALKPHOS 52 07/30/2018 2001   BILITOT 0.8 07/30/2018 2001   GFRNONAA >60 08/10/2018 0303   GFRAA >60 08/10/2018 0303   Lipase     Component Value Date/Time   LIPASE 130 (H) 07/30/2018 2001       Studies/Results: Ct Angio Chest Aorta W/cm &/or  Wo/cm  Result Date: 08/08/2018 CLINICAL DATA:  Status post bilateral popliteal and tibial embolectomy and right below-knee amputation for embolic occlusions and ischemia. Splenic infarction. EXAM: CT ANGIOGRAPHY CHEST WITH CONTRAST TECHNIQUE: Multidetector CT imaging of the chest was performed using the standard protocol during bolus administration of intravenous contrast. Multiplanar CT image reconstructions and MIPs were obtained to evaluate the vascular anatomy. CONTRAST:  OMNIPAQUE IOHEXOL 350 MG/ML SOLN COMPARISON:  CTA of the abdominal aorta with bilateral runoff on 08/06/2018. FINDINGS: Cardiovascular: The thoracic aorta is normal in caliber and demonstrates mild calcified plaque at the level of the aortic arch. No evidence of significant mural thrombus in the thoracic aorta or vasculitis. No aortic dissection. Proximal great vessels are normally patent. Central pulmonary arteries are normal in caliber. The heart size is within normal limits. No obvious intracardiac thrombus or mass by CT. No pericardial fluid identified. Calcified plaque is noted in the coronary artery tree in the distribution of the LAD and left circumflex coronary arteries. The right coronary artery is blurred by motion. Mediastinum/Nodes: No enlarged mediastinal, hilar, or axillary lymph nodes. Thyroid gland, trachea, and esophagus demonstrate no significant findings. Lungs/Pleura: There is atelectasis in both lower lobes. There is no evidence of pulmonary edema, consolidation, pneumothorax, nodule or pleural fluid. Small left pleural effusion and trace  amount of right pleural fluid. Upper Abdomen: The visualized upper abdomen demonstrates complete infarction of the spleen as noted on the prior CTA which was not present on the CT dated 07/30/2018. The spleen is correspondingly enlarged and diffusely necrotic and liquefied without evidence of discrete splenic abscess or parenchymal gas in the necrotic spleen. Musculoskeletal: No  chest wall abnormality. No acute or significant osseous findings. Review of the MIP images confirms the above findings. IMPRESSION: 1. Mild calcified plaque at the level of the aortic arch. No evidence of thoracic aortic aneurysmal disease or mural thrombus. 2. Coronary artery disease with calcified plaque in the LAD and left circumflex coronary arteries. The RCA is blurred by motion. 3. Small left pleural effusion and trace right pleural fluid. 4. 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 Electronically Signed   By: Irish LackGlenn  Yamagata M.D.   On: 08/08/2018 11:43    Anti-infectives: Anti-infectives (From admission, onward)   Start     Dose/Rate Route Frequency Ordered Stop   08/08/18 2000  vancomycin (VANCOCIN) IVPB 1000 mg/200 mL premix     1,000 mg 200 mL/hr over 60 Minutes Intravenous Every 12 hours 08/08/18 1004 08/09/18 0925   08/08/18 0830  vancomycin (VANCOCIN) IVPB 1000 mg/200 mL premix  Status:  Discontinued     1,000 mg 200 mL/hr over 60 Minutes Intravenous To Surgery 08/08/18 0828 08/08/18 1004   08/08/18 0715  vancomycin (VANCOCIN) IVPB 1000 mg/200 mL premix     1,000 mg 200 mL/hr over 60 Minutes Intravenous To ShortStay Surgical 08/07/18 1624 08/09/18 0715   07/31/18 0600  clindamycin (CLEOCIN) IVPB 900 mg     900 mg 100 mL/hr over 30 Minutes Intravenous On call to O.R. 07/30/18 2208 07/30/18 2352   07/31/18 0600  gentamicin (GARAMYCIN) 310 mg in dextrose 5 % 100 mL IVPB     5 mg/kg  61.2 kg 107.8 mL/hr over 60 Minutes Intravenous On call to O.R. 07/30/18 2208 07/31/18 0006   07/31/18 0600  cefTRIAXone (ROCEPHIN) 2 g in sodium chloride 0.9 % 100 mL IVPB    Note to Pharmacy:  Pharmacy may adjust dosing strength / duration / interval for maximal efficacy   2 g 200 mL/hr over 30 Minutes Intravenous Daily 07/31/18 0258 08/04/18 0616   07/31/18 0400  metroNIDAZOLE (FLAGYL) IVPB 500 mg     500 mg 100 mL/hr over 60 Minutes Intravenous Every 6 hours  07/31/18 0258 08/01/18 0546   07/31/18 0003  clindamycin (CLEOCIN) 900 mg, gentamicin (GARAMYCIN) 240 mg in sodium chloride 0.9 % 1,000 mL for intraperitoneal lavage  Status:  Discontinued       As needed 07/31/18 0003 07/31/18 0052   07/30/18 2249  clindamycin (CLEOCIN) 900 MG/50ML IVPB    Note to Pharmacy:  Mirian Moarver, [place]   : cabinet override      07/30/18 2249 07/30/18 2322   07/30/18 2215  clindamycin (CLEOCIN) 900 mg, gentamicin (GARAMYCIN) 240 mg in sodium chloride 0.9 % 1,000 mL for intraperitoneal lavage  Status:  Discontinued    Note to Pharmacy:  Have in the  OR room for final irrigation in bowel surgery case to minimize risk of abscess/infection Pharmacy may adjust dosing strength, schedule, rate of infusion, etc as needed to optimize therapy    Irrigation To Surgery 07/30/18 2208 07/31/18 1024   07/30/18 2130  ceFEPIme (MAXIPIME) 2 g in sodium chloride 0.9 % 100 mL IVPB     2 g 200 mL/hr over 30 Minutes Intravenous  Once 07/30/18 2129 07/30/18 2232   07/30/18 2130  metroNIDAZOLE (FLAGYL) IVPB 500 mg     500 mg 100 mL/hr over 60 Minutes Intravenous  Once 07/30/18 2129 07/30/18 2306       Assessment/Plan Acute kidney disease Dehydration Huntington's disease Scoliosis Osteoarthritis Hx of LONG term NSAID use 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  Will need splenic vaccines prior to discharge BLE ischemia, POD 3, s/pBilateral popliteal and tibial embolectomies. Left lateral and medial fasciotomy, Dr. Tawanna Cooler Early POD 2, s/p right BKA -care per vascular surgery.  Appreciate their assistance Perforated gastric ulcer S/p Omental [name] patich, diagnostic laparoscopy, washout/drainage of intraabdominal abscess, 07/30/18, Dr. Viviann Spare GrossPOD#11 -soft diet.  -OOB to chair as able given issues in her legs  LYY:TKPT diet ID: Maxipime 4/22 x 1; Flagyl 4/22 - 4/24;Rocephin 4/23 - 4/27. Vanc 4/30 - 5/2 DVT: Lovenox  Follow up: Dr.  Karie Soda POC: Spouse Rickard Rhymes [phone]  Dispo: CIR consult pending.   LOS: 11 days    Andria Meuse , MD [facility] Surgery 08/10/2018, 8:46 AM Pager: [phone]

## 2018-08-10 NOTE — Progress Notes (Signed)
Right BKA dressing change done per MD order. Reviewed with patient while changing. Patient will need reinforcement. Ice applied to leg  K. Doran Clay, RN

## 2018-08-10 NOTE — Evaluation (Signed)
Occupational Therapy Re-Evaluation Patient Details Name: Natasha BeckmannJudith Shular MRN: [id number] DOB: 05/02/1955 Today's Date: 08/10/2018    History of Present Illness Pt is a 63 year old female s/p lap Graham patch of perforated gastric ulcer on 07/30/18 by Dr. Michaell CowingGross.  [facility] as she needed bilateral popliteal and tibial embolectomies as well as left fasciotomy on 4/29. 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 Pt followed commands with extra time but some of what she said did not make sense.  Cues given for safety and to stay on task (hygiene due to small BM). Pt was pretty functional at home prior to admission. She is doing better with UB adls than when first assessed. Husband is supportive and assists her. Recommend CIR to maximize participation/independence with adls prior to home.     Follow Up Recommendations  CIR    Equipment Recommendations  (? drop arm commode)    Recommendations for Other Services       Precautions / Restrictions Precautions Precautions: Fall Precaution Comments: 1 JP drain, L LE numbness and drop foot s/p fasciatomy  Restrictions Weight Bearing Restrictions: Yes RLE Weight Bearing: Non weight bearing      Mobility Bed Mobility     Rolling: Mod assist(to bil sides)            Transfers                 General transfer comment: not performed    Balance                                           ADL either performed or assessed with clinical judgement   ADL   Eating/Feeding: Set up;Bed level Eating/Feeding Details (indicate cue type and reason): beverage only; not interested in eating Grooming: Minimal assistance Grooming Details (indicate cue type and reason): washed face and hands Upper Body Bathing: Minimal assistance;Bed level   Lower Body Bathing: Total  assistance;Bed level   Upper Body Dressing : Minimal assistance;Moderate assistance;Bed level   Lower Body Dressing: Total assistance;Bed level       Toileting- Clothing Manipulation and Hygiene: Total assistance;Sit to/from stand         General ADL Comments: performed grooming and donning gown.  Assisted with hygiene and replaced purewick catheter     Vision         Perception     Praxis      Pertinent Vitals/Pain Pain Assessment: Faces Faces Pain Scale: Hurts even more Pain Location: abdomen and R residual limb Pain Descriptors / Indicators: Sore Pain Intervention(s): Limited activity within patient's tolerance;Monitored during session;Repositioned;Patient requesting pain meds-RN notified     Hand Dominance     Extremity/Trunk Assessment Upper Extremity Assessment Upper Extremity Assessment: Generalized weakness RUE Deficits / Details: used bil UEs to wash face LUE Deficits / Details: reached out to table to get ensure and drink it           Communication Communication Communication: No difficulties   Cognition Arousal/Alertness: Awake/alert Behavior During Therapy: WFL for tasks assessed/performed                           Following Commands: Follows  one step commands with increased time Safety/Judgement: Decreased awareness of safety     General Comments: Pt was more confused today than on previous visit:  she was stated: "we should make wallets or something and sell them and put this inside (call bell)"   General Comments       Exercises     Shoulder Instructions      Home Living Family/patient expects to be discharged to:: Private residence Living Arrangements: Spouse/significant other Available Help at Discharge: Family Type of Home: House                 Bathroom Toilet: [facility])     Home Equipment: Dan Humphreys - 2 wheels;Bedside commode          Prior Functioning/Environment          Comments: called husband. pt  normally dresses herself; he does IADLs.  He assists with ambulating--mostly him holding onto her        OT Problem List: Decreased strength;Decreased activity tolerance;Impaired balance (sitting and/or standing);Pain;Decreased safety awareness;Decreased cognition      OT Treatment/Interventions: Self-care/ADL training;DME and/or AE instruction;Therapeutic activities;Cognitive remediation/compensation;Patient/family education;Balance training;Therapeutic exercise    OT Goals(Current goals can be found in the care plan section) Acute Rehab OT Goals Patient Stated Goal: none stated OT Goal Formulation: With family Time For Goal Achievement: 08/24/18 Potential to Achieve Goals: Fair ADL Goals Pt Will Perform Eating: with set-up;sitting(food requiring utensils) Pt Will Perform Grooming: with set-up;sitting Pt Will Transfer to Toilet: with max assist;bedside commode;with transfer board(drop arm) Pt Will Perform Toileting - Clothing Manipulation and hygiene: with max assist;sitting/lateral leans Additional ADL Goal #1: pt will complete UB dressing with setup/pretied gown Additional ADL Goal #2: pt will go from sidelying to sit with mod A in preparation for toilet transfer  OT Frequency: Min 2X/week   Barriers to D/C:            Co-evaluation              AM-PAC OT "6 Clicks" Daily Activity     Outcome Measure Help from another person eating meals?: A Little Help from another person taking care of personal grooming?: A Little Help from another person toileting, which includes using toliet, bedpan, or urinal?: Total Help from another person bathing (including washing, rinsing, drying)?: A Lot Help from another person to put on and taking off regular upper body clothing?: A Lot Help from another person to put on and taking off regular lower body clothing?: Total 6 Click Score: 12   End of Session    Activity Tolerance: Patient tolerated treatment well Patient left: in bed;with  call bell/phone within reach;with bed alarm set  OT Visit Diagnosis: Muscle weakness (generalized) (M62.81);Pain Pain - Right/Left: Right Pain - part of body: Leg                Time: 1594-5859 OT Time Calculation (min): 29 min Charges:  OT General Charges $OT Visit: 1 Visit OT Evaluation $OT Re-eval: 1 Re-eval OT Treatments $Self Care/Home Management : 8-22 mins  Marica Otter, OTR/L Acute Rehabilitation Services [phone] [facility] pager [phone] office 08/10/2018  SPENCER,MARYELLEN 08/10/2018, 3:29 PM

## 2018-08-10 NOTE — Progress Notes (Signed)
Patient ID: Natasha Frederick, female   DOB: 15-Aug-1955, 63 y.o.   MRN: [id number]  Progress Note    08/10/2018 6:37 AM 2 Days Post-Op  Subjective: More comfortable.  Still with diarrhea.  Less leg pain.  Still no sensory function in her left leg below the knee   Vitals:   08/09/18 2009 08/10/18 0416  BP: 120/79 (!) 142/94  Pulse: 90 87  Resp: 15 20  Temp: 98.6 F (37 C) 98.6 F (37 C)  SpO2: 92% 90%   Physical Exam: Left medial and lateral fasciotomy sites healing nicely.  Foot warm and well-perfused.  No change in superficial ulcer on the dorsum between her ankle and dorsum of her foot  CBC    Component Value Date/Time   WBC 18.8 (H) 08/10/2018 0303   RBC 3.01 (L) 08/10/2018 0303   HGB 8.6 (L) 08/10/2018 0303   HCT 26.5 (L) 08/10/2018 0303   PLT 883 (H) 08/10/2018 0303   MCV 88.0 08/10/2018 0303   MCH 28.6 08/10/2018 0303   MCHC 32.5 08/10/2018 0303   RDW 13.7 08/10/2018 0303   LYMPHSABS 0.2 (L) 07/30/2018 2001   MONOABS 0.1 07/30/2018 2001   EOSABS 0.0 07/30/2018 2001   BASOSABS 0.0 07/30/2018 2001    BMET    Component Value Date/Time   NA 134 (L) 08/10/2018 0303   K 3.4 (L) 08/10/2018 0303   CL 98 08/10/2018 0303   CO2 27 08/10/2018 0303   GLUCOSE 100 (H) 08/10/2018 0303   BUN 7 (L) 08/10/2018 0303   CREATININE 0.42 (L) 08/10/2018 0303   CALCIUM 8.0 (L) 08/10/2018 0303   GFRNONAA >60 08/10/2018 0303   GFRAA >60 08/10/2018 0303    INR    Component Value Date/Time   INR 0.93 07/09/2014 1007     Intake/Output Summary (Last 24 hours) at 08/10/2018 0637 Last data filed at 08/10/2018 0417 Gross per 24 hour  Intake 1413.2 ml  Output 1955 ml  Net -541.8 ml     Assessment/Plan:  63 y.o. female stable overall.  Still no explanation as to emboli to her feet, kidney and spleen.  Foot appears viable but minimal neurologic function     Larina Earthly, MD [facility] [phone] 08/10/2018 6:37 AM

## 2018-08-11 DIAGNOSIS — Z89511 Acquired absence of right leg below knee: Secondary | ICD-10-CM

## 2018-08-11 DIAGNOSIS — I998 Other disorder of circulatory system: Secondary | ICD-10-CM

## 2018-08-11 DIAGNOSIS — K251 Acute gastric ulcer with perforation: Secondary | ICD-10-CM

## 2018-08-11 MED ORDER — MENINGOCOCCAL A C Y&W-135 OLIG IM SOLR
0.5000 mL | Freq: Once | INTRAMUSCULAR | Status: AC
  Administered 2018-08-11: 0.5 mL via INTRAMUSCULAR
  Filled 2018-08-11: qty 0.5

## 2018-08-11 MED ORDER — HAEMOPHILUS B POLYSAC CONJ VAC IM SOLR
0.5000 mL | Freq: Once | INTRAMUSCULAR | Status: AC
  Administered 2018-08-12: 09:00:00 0.5 mL via INTRAMUSCULAR
  Filled 2018-08-11: qty 0.5

## 2018-08-11 MED ORDER — ADULT MULTIVITAMIN W/MINERALS CH
1.0000 | ORAL_TABLET | Freq: Every day | ORAL | Status: DC
  Administered 2018-08-11 – 2018-08-13 (×3): 1 via ORAL
  Filled 2018-08-11 (×3): qty 1

## 2018-08-11 MED ORDER — PNEUMOCOCCAL 13-VAL CONJ VACC IM SUSP
0.5000 mL | Freq: Once | INTRAMUSCULAR | Status: AC
  Administered 2018-08-11: 15:00:00 0.5 mL via INTRAMUSCULAR
  Filled 2018-08-11: qty 0.5

## 2018-08-11 NOTE — Care Management Important Message (Signed)
Important Message  Patient Details  Name: Natasha Frederick MRN: [id number] Date of Birth: 12-08-1955   Medicare Important Message Given:  Yes    [name] 08/11/2018, 3:51 PM

## 2018-08-11 NOTE — Progress Notes (Signed)
Pillows placed under pt's left lower extremity. Pillow placed against bottom of foot to prop it up (pt has footdrop). Pt educated on need to wear ortho boot provided for footdrop but patient refused to put it on. Will continue to monitor. Victorino December, RN

## 2018-08-11 NOTE — Progress Notes (Signed)
Pt re-adjusted, re-directed and encouraged to keep L foot in prafo boot to off load heal.  Tried kick stand on boot and pillows to support and noted unsuccessfull.

## 2018-08-11 NOTE — Progress Notes (Addendum)
3 Days Post-Op  Subjective: No abdominal pain, N/V. Tolerating diet. Passing flatus and had loose BM yesterday.   Objective: Vital signs in last 24 hours: Temp:  [98.4 F (36.9 C)-98.7 F (37.1 C)] 98.4 F (36.9 C) (05/04 0328) Pulse Rate:  [80] 80 (05/04 0328) Resp:  [14-30] 14 (05/04 0328) BP: (132-161)/(72-83) 161/83 (05/04 0328) SpO2:  [95 %] 95 % (05/04 0328) Last BM Date: 08/10/18  Intake/Output from previous day: 05/03 0701 - 05/04 0700 In: 0  Out: 2600 [Urine:2600] Intake/Output this shift: No intake/output data recorded.  PE: Gen: Awake and alert, NAD Heart: regular Lungs: CTAB Abd: soft, nontender, ND, +BS, incision c/d/i without erythema. No signs of infection where drain was removed.  Ext: palpable pulses DP in left foot with foot drop; RLE BKA site dressed and dry  Lab Results:  Recent Labs    08/09/18 0205 08/10/18 0303  WBC 19.3* 18.8*  HGB 8.5* 8.6*  HCT 26.9* 26.5*  PLT 504* 883*   BMET Recent Labs    08/09/18 0205 08/10/18 0303  NA 134* 134*  K 4.0 3.4*  CL 102 98  CO2 23 27  GLUCOSE 115* 100*  BUN 8 7*  CREATININE 0.45 0.42*  CALCIUM 8.0* 8.0*   PT/INR No results for input(s): LABPROT, INR in the last 72 hours. CMP     Component Value Date/Time   NA 134 (L) 08/10/2018 0303   K 3.4 (L) 08/10/2018 0303   CL 98 08/10/2018 0303   CO2 27 08/10/2018 0303   GLUCOSE 100 (H) 08/10/2018 0303   BUN 7 (L) 08/10/2018 0303   CREATININE 0.42 (L) 08/10/2018 0303   CALCIUM 8.0 (L) 08/10/2018 0303   PROT 5.8 (L) 07/30/2018 2001   ALBUMIN 3.2 (L) 07/30/2018 2001   AST 44 (H) 07/30/2018 2001   ALT 23 07/30/2018 2001   ALKPHOS 52 07/30/2018 2001   BILITOT 0.8 07/30/2018 2001   GFRNONAA >60 08/10/2018 0303   GFRAA >60 08/10/2018 0303   Lipase     Component Value Date/Time   LIPASE 130 (H) 07/30/2018 2001       Studies/Results: No results found.  Anti-infectives: Anti-infectives (From admission, onward)   Start     Dose/Rate  Route Frequency Ordered Stop   08/08/18 2000  vancomycin (VANCOCIN) IVPB 1000 mg/200 mL premix     1,000 mg 200 mL/hr over 60 Minutes Intravenous Every 12 hours 08/08/18 1004 08/09/18 0925   08/08/18 0830  vancomycin (VANCOCIN) IVPB 1000 mg/200 mL premix  Status:  Discontinued     1,000 mg 200 mL/hr over 60 Minutes Intravenous To Surgery 08/08/18 0828 08/08/18 1004   08/08/18 0715  vancomycin (VANCOCIN) IVPB 1000 mg/200 mL premix     1,000 mg 200 mL/hr over 60 Minutes Intravenous To ShortStay Surgical 08/07/18 1624 08/09/18 0715   07/31/18 0600  clindamycin (CLEOCIN) IVPB 900 mg     900 mg 100 mL/hr over 30 Minutes Intravenous On call to O.R. 07/30/18 2208 07/30/18 2352   07/31/18 0600  gentamicin (GARAMYCIN) 310 mg in dextrose 5 % 100 mL IVPB     5 mg/kg  61.2 kg 107.8 mL/hr over 60 Minutes Intravenous On call to O.R. 07/30/18 2208 07/31/18 0006   07/31/18 0600  cefTRIAXone (ROCEPHIN) 2 g in sodium chloride 0.9 % 100 mL IVPB    Note to Pharmacy:  Pharmacy may adjust dosing strength / duration / interval for maximal efficacy   2 g 200 mL/hr over 30 Minutes Intravenous Daily  07/31/18 0258 08/04/18 0616   07/31/18 0400  metroNIDAZOLE (FLAGYL) IVPB 500 mg     500 mg 100 mL/hr over 60 Minutes Intravenous Every 6 hours 07/31/18 0258 08/01/18 0546   07/31/18 0003  clindamycin (CLEOCIN) 900 mg, gentamicin (GARAMYCIN) 240 mg in sodium chloride 0.9 % 1,000 mL for intraperitoneal lavage  Status:  Discontinued       As needed 07/31/18 0003 07/31/18 0052   07/30/18 2249  clindamycin (CLEOCIN) 900 MG/50ML IVPB    Note to Pharmacy:  Mirian Moarver, [place]   : cabinet override      07/30/18 2249 07/30/18 2322   07/30/18 2215  clindamycin (CLEOCIN) 900 mg, gentamicin (GARAMYCIN) 240 mg in sodium chloride 0.9 % 1,000 mL for intraperitoneal lavage  Status:  Discontinued    Note to Pharmacy:  Have in the  OR room for final irrigation in bowel surgery case to minimize risk of abscess/infection Pharmacy may adjust  dosing strength, schedule, rate of infusion, etc as needed to optimize therapy    Irrigation To Surgery 07/30/18 2208 07/31/18 1024   07/30/18 2130  ceFEPIme (MAXIPIME) 2 g in sodium chloride 0.9 % 100 mL IVPB     2 g 200 mL/hr over 30 Minutes Intravenous  Once 07/30/18 2129 07/30/18 2232   07/30/18 2130  metroNIDAZOLE (FLAGYL) IVPB 500 mg     500 mg 100 mL/hr over 60 Minutes Intravenous  Once 07/30/18 2129 07/30/18 2306       Assessment/Plan Acute kidney disease - Cr 0.42 Dehydration Huntington's disease Scoliosis Osteoarthritis Hx of LONG term NSAID use ABL Anemia - Hgb stable at 8.6 5/3. 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 Splenic vaccines ordered BLE ischemia, POD3, s/pBilateral popliteal and tibial embolectomies. Left lateral and medial fasciotomy, Dr. Tawanna Coolerodd Early, 5/1 POD 3, s/p right BKA -care per vascular surgery. Appreciate their assistance Perforated gastric ulcer S/p Omental [name] patich, diagnostic laparoscopy, washout/drainage of intraabdominal abscess, 07/30/18, Dr. Viviann SpareSteven GrossPOD#12 -soft diet.  -OOB to chair as able  - IS  ZOX:WRUEFEN:soft diet ID: Maxipime 4/22 x 1; Flagyl 4/22 - 4/24;Rocephin 4/23 - 4/27. Vanc 4/30 - 5/2 DVT: Lovenox  Follow up: Dr. Karie SodaSteven Gross POC: Spouse Rickard RhymesLewis Musial [phone]  Dispo: CIR consult pending.    LOS: 12 days    Jacinto HalimMichael M Maczis , [facility] 08/11/2018, 9:54 AM Pager: [phone]

## 2018-08-11 NOTE — Consult Note (Signed)
Physical Medicine and Rehabilitation Consult Reason for Consult: Decreased functional mobility Referring Physician: Critical care medicine   HPI: Natasha BeckmannJudith Frederick is a 63 y.o. right-handed female with history of Huntington's disease followed by neurology services [facility]asha Frederick [facility], scoliosis and osteoarthritis.  Per chart review lives with spouse.. Needed some assist with ADLs and used a walker.  Presented 07/30/2018 with persistent abdominal pain for the last couple of weeks with associated loose bowel movements decrease in appetite.  Recently on clindamycin for some dental issues.  Latest C. difficile negative.  CT scan of the abdomen showed large volume of free air primarily focused in upper abdomen.  Perforated stomach ulcer suspected.  Lactic acid 4.7, creatinine 1.53-1.84.  Patient underwent Natasha Frederick of perforated gastric ulcer 07/30/2018 per Dr. Michaell Frederick.  Maintained on broad-spectrum antibiotics.  Lovenox later initiated for DVT prophylaxis.  Hospital course upper GI completed 08/02/2018 showed contained leak maintained with JP drain.  Patient with intermittent bouts of agitation and restlessness with neurology follow-up for possible delirium.  Vascular surgery consulted 08/06/2018 for bilateral lower extremity ischemia.  She had no Doppler flow in her feet.  Underwent CT scan showing occlusion of the floater both lower extremities.  Suspect emboli to her feet kidney and spleen.  Patient with progressive ischemic changes bilateral lower extremities.  Underwent bilateral popliteal and tibial embolectomy's.  Left lateral medial fasciotomy 08/06/2018.  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 Arbie Frederick.  Hospital course pain management.  Close monitoring of left foot for ongoing ischemic changes.  Acute blood loss anemia 8.6, leukocytosis 18,800.  Tolerating mechanical soft diet.  Therapy evaluations completed with recommendations of physical  medicine rehab consult.   Review of Systems  Constitutional: Positive for fever and weight loss. Negative for chills.  HENT: Negative for hearing loss.   Eyes: Negative for blurred vision and double vision.  Respiratory: Negative for cough and shortness of breath.   Cardiovascular: Negative for chest pain and leg swelling.  Gastrointestinal: Positive for abdominal pain and diarrhea. Negative for heartburn, nausea and vomiting.  Genitourinary: Negative for dysuria, flank pain and hematuria.  Musculoskeletal: Positive for back pain and myalgias.  Skin: Negative for rash.  Neurological: Positive for weakness.  All other systems reviewed and are negative.  Past Medical History:  Diagnosis Date   Arthritis    hip   Endometriosis    Scoliosis    Past Surgical History:  Procedure Laterality Date   AMPUTATION Right 08/08/2018   Procedure: AMPUTATION BELOW KNEE RIGHT LOWER EXTREMITY;  Surgeon: Larina EarthlyEarly, Todd F, MD;  Location: [facility] OR;  Service: Vascular;  Laterality: Right;   ANTERIOR LAT LUMBAR FUSION Left 01/05/2014   Procedure: LUMBAR TWO TO THREE, LUMBAR LUMBAR THREE TO FOUR, ANTERIOR LATERAL LUMBAR FUSION 2 LEVELS;  Surgeon: Reinaldo Meekerandy O Kritzer, MD;  Location: [facility] NEURO ORS;  Service: Neurosurgery;  Laterality: Left;   APPENDECTOMY  1963   BACK SURGERY     BREAST SURGERY Left 1987   bx   COLONOSCOPY W/ POLYPECTOMY     EYE SURGERY     FEMORAL-POPLITEAL BYPASS GRAFT Bilateral 08/06/2018   Procedure: BILATERAL POPLITEAL AND TIBIAL EMBOLECTOMIES, LEFT LEG FASCIOTOMY;  Surgeon: Larina EarthlyEarly, Todd F, MD;  Location: [facility] OR;  Service: Vascular;  Laterality: Bilateral;   LAPAROSCOPY N/A 07/30/2018   Procedure: DIAGNOSTIC LAPAROSCOPY, Omentopexy gram Frederick, Washout of intra-abdominal abcesses x 3;  Surgeon: Karie SodaGross, Steven, MD;  Location: [facility] ORS;  Service: General;  Laterality: N/A;   OOPHORECTOMY  1977   left   Planters wart Left 2005   radial keratoto Bilateral    TONSILLECTOMY  1958   TOTAL  HIP ARTHROPLASTY Left 07/19/2014   Procedure: TOTAL HIP ARTHROPLASTY;  Surgeon: Gean Birchwood, MD;  Location: [facility] OR;  Service: Orthopedics;  Laterality: Left;   TUBAL LIGATION  1986   Family History  Problem Relation Age of Onset   Lung cancer Mother    Stroke Father    Huntington's disease Paternal Grandmother    Huntington's disease Paternal Aunt    Huntington's disease Paternal Uncle    Arthritis Neg Hx        family hx   Cancer Neg Hx        prostate ca -family hx   Heart disease Neg Hx        family hx   Social History:  reports that she quit smoking about 24 years ago. Her smoking use included cigarettes. She has a 8.00 pack-year smoking history. She has never used smokeless tobacco. She reports current alcohol use of about 3.0 standard drinks of alcohol per week. She reports that she does not use drugs. Allergies:  Allergies  Allergen Reactions   Bee Venom Anaphylaxis and Swelling   Nsaids Other (See Comments)    PERFORATED ULCER = NO MORE NSAIDs   Adhesive [Tape] Other (See Comments)    redness   Codeine    Penicillins    Sulfa Antibiotics    Medications Prior to Admission  Medication Sig Dispense Refill   Deutetrabenazine (AUSTEDO) 12 MG TABS Take 24 mg by mouth 2 (two) times daily. 360 tablet 1    Home: Home Living Family/patient expects to be discharged to:: Private residence Living Arrangements: Spouse/significant other Available Help at Discharge: Family Type of Home: House Home Access: Stairs to enter(3 to garage; hand rail) Firefighter: [facility]) Home Equipment: Environmental consultant - 2 wheels, Bedside commode Additional Comments: nursing reports pt lives with spouse, pt poor historian  Functional History: Prior Function Comments: called husband. pt normally dresses herself; he does IADLs.  He assists with ambulating--mostly him holding onto her Functional Status:  Mobility: Bed Mobility Overal bed mobility: Needs Assistance Bed Mobility: Supine to  Sit Rolling: Mod assist(to bil sides) Sidelying to sit: Mod assist Supine to sit: Min assist Sit to supine: Mod assist Sit to sidelying: +2 for safety/equipment, Max assist General bed mobility comments: minA for pulling up against therapist, able to move LE to EoB without assist Transfers Overall transfer level: Needs assistance Equipment used: None Transfer via Lift Equipment: Stedy Transfers: Lateral/Scoot Transfers Sit to Stand: Mod assist, +2 physical assistance  Lateral/Scoot Transfers: +2 physical assistance, Max assist General transfer comment: not performed      ADL: ADL Overall ADL's : Needs assistance/impaired Eating/Feeding: Set up, Bed level Eating/Feeding Details (indicate cue type and reason): beverage only; not interested in eating Grooming: Minimal assistance Grooming Details (indicate cue type and reason): washed face and hands Upper Body Bathing: Minimal assistance, Bed level Lower Body Bathing: Total assistance, Bed level Upper Body Dressing : Minimal assistance, Moderate assistance, Bed level Lower Body Dressing: Total assistance, Bed level Toileting- Clothing Manipulation and Hygiene: Total assistance, Sit to/from stand General ADL Comments: performed grooming and donning gown.  Assisted with hygiene and replaced purewick catheter  Cognition: Cognition Overall Cognitive Status: Within Functional Limits for tasks assessed Orientation Level: Oriented X4 Cognition Arousal/Alertness: Awake/alert Behavior During Therapy: WFL for tasks assessed/performed Overall Cognitive  Status: Within Functional Limits for tasks assessed Area of Impairment: Safety/judgement, Following commands Following Commands: Follows one step commands with increased time Safety/Judgement: Decreased awareness of safety General Comments: Pt was more confused today than on previous visit:  she was stated: "we should make wallets or something and sell them and put this inside (call  bell)"  Blood pressure (!) 161/83, pulse 80, temperature 98.4 F (36.9 C), temperature source Oral, resp. rate 14, height 5\' 4"  (1.626 m), weight 64.2 kg, SpO2 95 %. Physical Exam  Constitutional: She appears well-developed. No distress.  HENT:  Head: Normocephalic.  Eyes: Pupils are equal, round, and reactive to light.  Neck: Neck supple.  Cardiovascular: Normal rate.  Respiratory: Effort normal.  GI: Soft.  Neurological:  Patient is alert sitting up in bed.  Follows commands.  Provides her name, age and date of birth. Choreatic movements noted. Has a hard time keeping left foot in PRAFO. UE grossly 4/5. LLE 3+ to 4-/5. RLE able to lift against gravity probably 3+.   Skin:  Right BKA site is dressed appropriately tender.  Ischemic areas dressed  to the left heel and foot.  Abdominal incision is dressed.Embolectomy site LLE clean and dry. Left leg is warm.   Psychiatric: She has a normal mood and affect. Her behavior is normal.    No results found for this or any previous visit (from the past 24 hour(s)). No results found.   Assessment/Plan: Diagnosis: debility and right BKA after perforated gastric ulcer in a 63 yo female with Huntington's chorea 1. Does the need for close, 24 hr/day medical supervision in concert with the patient's rehab needs make it unreasonable for this patient to be served in a less intensive setting? Yes 2. Co-Morbidities requiring supervision/potential complications: lower limb ischemia, abdominal wounds/considerations, pain mgt 3. Due to bladder management, bowel management, safety, skin/wound care, disease management, medication administration, pain management and patient education, does the patient require 24 hr/day rehab nursing? Yes 4. Does the patient require coordinated care of a physician, rehab nurse, PT (1-2 hrs/day, 5 days/week) and OT (1-2 hrs/day, 5 days/week) to address physical and functional deficits in the context of the above medical  diagnosis(es)? Yes Addressing deficits in the following areas: balance, endurance, locomotion, strength, transferring, bowel/bladder control, bathing, dressing, feeding, grooming, toileting and psychosocial support 5. Can the patient actively participate in an intensive therapy program of at least 3 hrs of therapy per day at least 5 days per week? Yes 6. The potential for patient to make measurable gains while on inpatient rehab is excellent 7. Anticipated functional outcomes upon discharge from inpatient rehab are modified independent and supervision  with PT, modified independent, supervision and min assist with OT at w/c level 8. Estimated rehab length of stay to reach the above functional goals is: 17-24 days 9. Anticipated D/C setting: Home 10. Anticipated post D/C treatments: HH therapy 11. Overall Rehab/Functional Prognosis: excellent  RECOMMENDATIONS: This patient's condition is appropriate for continued rehabilitative care in the following setting: CIR Patient has agreed to participate in recommended program. Yes Note that insurance prior authorization may be required for reimbursement for recommended care.  Comment: Rehab Admissions Coordinator to follow up.  Thanks,  Ranelle Oyster, MD, [place]  I have personally performed a face to face diagnostic evaluation of this patient. Additionally, I have examined pertinent labs and radiographic images. I have reviewed and concur with the physician assistant's documentation above.    Mcarthur Rossetti Angiulli, PA-C 08/11/2018

## 2018-08-11 NOTE — Progress Notes (Addendum)
[facility]  Subjective  - No feeling in the left LE below the knee, right stump with mild discomfort.   Objective (!) 161/83 80 98.4 F (36.9 C) (Oral) 14 95%  Intake/Output Summary (Last 24 hours) at 08/11/2018 0722 Last data filed at 08/11/2018 0340 Gross per 24 hour  Intake 0 ml  Output 2250 ml  Net -2250 ml    Right stump warm, incision healing well Left fasciotomy incisions healing well, left foot warm and well perfused.  Placed AFO to help with foot drop.  Assessment/Planning:   63 y.o. female stable overall.  Still no explanation as to emboli to her feet, kidney and spleen.  Foot appears viable from a vascular point of view, but minimal neurologic function.    AFO as much as possible to prevent foot drop contracture. Stump appear viable and is healing well. Pending CIR   Mosetta Pigeon 08/11/2018 7:22 AM --  Laboratory Lab Results: Recent Labs    08/09/18 0205 08/10/18 0303  WBC 19.3* 18.8*  HGB 8.5* 8.6*  HCT 26.9* 26.5*  PLT 504* 883*   BMET Recent Labs    08/09/18 0205 08/10/18 0303  NA 134* 134*  K 4.0 3.4*  CL 102 98  CO2 23 27  GLUCOSE 115* 100*  BUN 8 7*  CREATININE 0.45 0.42*  CALCIUM 8.0* 8.0*    COAG Lab Results  Component Value Date   INR 0.93 07/09/2014   No results found for: PTT  I have examined the patient, reviewed and agree with above.  I had a long discussion with the patient regarding her unknown rehabilitation potential.  This all hinges on ability to heal her right below-knee amputation and mainly on neurologic dysfunction in her left foot.  Left foot continues to be viable but minimal neurologic function.  Will concerned about right below-knee amputation with surgical findings but at least to date that this appears to be healing without difficulty.  Gretta Began, MD 08/11/2018 11:02 AM

## 2018-08-11 NOTE — Progress Notes (Addendum)
Initial Nutrition Assessment  DOCUMENTATION CODES:   Not applicable  INTERVENTION:   D/C Ensure surgery    Magic cup TID with meals, each supplement provides 290 kcal and 9 grams of protein  MVI daily  NUTRITION DIAGNOSIS:   Increased nutrient needs related to post-op healing as evidenced by estimated needs.  GOAL:   Patient will meet greater than or equal to 90% of their needs  MONITOR:   Supplement acceptance, Labs, Skin, I & O's, Weight trends, PO intake  REASON FOR ASSESSMENT:   LOS    ASSESSMENT:   Patient with PMH significant for Huntington's disease and osteoarthritis. Presents this admission with perforated gastric ulcer, biliary peritonitis with intra-abdominal abscesses.    4/23- omental patch, washout/drainage of intraabdominal abscesses 4/29- bilateral pop/tib embolectomies, left lateral fasciotomy  5/1- right BKA, diet advanced to soft  RD working remotely.  Attempted to speak with pt via phone, no answer x2. Meal completions charted as 75-100% for pt's last eight meals. Spoke with RN who reports pt refused breakfast this am. Pt does not like Ensure/Boost products but willing to try ice cream. Will need to obtain more history if possible.   Per chart records, pt weighed 158 lb at [facility] on [facility]/18 and 141 ln this admission (prior to BKA). This shows an 11% wt loss in five months which is significant for time frame. Will need to confirm loss with pt.   CIR consult pending.   I/O: -235 ml since admit UOP: 2,600 ml x 24 hrs   Medications: miralax Labs: Na 134 (L) K 3.4 (L)   Diet Order:   Diet Order            DIET SOFT Room service appropriate? No; Fluid consistency: Thin  Diet effective now              EDUCATION NEEDS:   Not appropriate for education at this time  Skin:  Skin Assessment: Skin Integrity Issues: Skin Integrity Issues:: Stage I, Incisions Stage I: left heel  Incisions: left/right leg  Last BM:  5/3  Height:   Ht  Readings from Last 1 Encounters:  08/06/18 5\' 4"  (1.626 m)    Weight:   Wt Readings from Last 1 Encounters:  08/06/18 64.2 kg    Ideal Body Weight:  51 kg(adjusted R BKA)  BMI:  Body mass index is 24.29 kg/m.  Estimated Nutritional Needs:   Kcal:  1800-2000 kcal  Protein:  100-115 grams  Fluid:  >/= 1.8 L/day   Vanessa Kick RD, LDN Clinical Nutrition Pager # - [phone]

## 2018-08-11 NOTE — Progress Notes (Signed)
Pt's dressing changed to R BKA. Patient tolerated well. Pt educated about need to keep clean. Pulses dopplerable in right and left lower extremity. Both warm to touch. Will continue to monitor. Victorino December, RN

## 2018-08-11 NOTE — Progress Notes (Signed)
Inpatient Rehabilitation Admissions Coordinator  I met with patient at bedside to discuss goals and expecations of an inpt rehab admission. I then contacted her spouse by phone who is 63 years old. He states that they were struggling prior to this admission and he does not feel he can care for her at a wheelchair level . He states at [facility] SW was assisting him with SNF and he continues to request SNF rehab. [facility] was a bed offer and he also wishes to look into the SNF off of [address]/he does not know th name. I have left a voicemail for both SW, Kathlee Nations and [facility]. We will  Sign off at this time.  Danne Baxter, RN, MSN Rehab Admissions Coordinator [phone] 08/11/2018 5:17 PM

## 2018-08-11 NOTE — Progress Notes (Signed)
Physical Therapy Treatment Patient Details Name: Natasha BeckmannJudith Frederick MRN: [id number] DOB: 10/27/1955 Today's Date: 08/11/2018    History of Present Illness Pt is a 63 year old female s/p lap Graham patch of perforated gastric ulcer on 07/30/18 by Dr. Michaell CowingGross.  [facility] needed bilateral popliteal and tibial embolectomies as well as left fasciotomy on 4/29. S/p R BKA 5/1  PMHx significant for Huntington's disease, scoliosis, back surgery    PT Comments    Pt with good sitting tolerance this session, and accepted challenge to sitting balance by performing LE exercises and scooting sitting EOB. Pt defers OOB activity this session, although pt got to recliner with OT yesterday. PT to continue to follow acutely and will progress mobility as able.    Follow Up Recommendations  Supervision/Assistance - 24 hour;CIR     Equipment Recommendations  Wheelchair (measurements PT);Wheelchair cushion (measurements PT)    Recommendations for Other Services       Precautions / Restrictions Precautions Precautions: Fall Precaution Comments:  L LE numbness and drop foot s/p fasciatomy  Restrictions RLE Weight Bearing: Non weight bearing    Mobility  Bed Mobility Overal bed mobility: Needs Assistance Bed Mobility: Rolling;Supine to Sit;Sit to Supine Rolling: Min guard         General bed mobility comments: min guard for rolling, verbal cuing for hand placement on bedrails and increased time to perform. Mod assist for supine<>sit for trunk elevation and lowering, scooting to EOB, and mod +2 for scooting up in bed with use of bed pad.   Transfers Overall transfer level: (NT - focus of session on static and dynamic sitting balance)                  Ambulation/Gait                 Stairs             Wheelchair Mobility    Modified Rankin (Stroke Patients Only)       Balance Overall balance assessment: Needs assistance Sitting-balance support: No  upper extremity supported;Feet supported Sitting balance-Leahy Scale: Fair Sitting balance - Comments: min guard for safety. Pt sat EOB 10 minutes with LE exercises, EOB scooting, and weight shifting  Postural control: Posterior lean   Standing balance-Leahy Scale: Zero Standing balance comment: unable s/p R BKA; pt defers OOB mobility this session                            Cognition Arousal/Alertness: Awake/alert Behavior During Therapy: WFL for tasks assessed/performed Overall Cognitive Status: No family/caregiver present to determine baseline cognitive functioning Area of Impairment: Attention;Following commands                   Current Attention Level: Selective   Following Commands: Follows one step commands with increased time Safety/Judgement: Decreased awareness of safety     General Comments: Pt was more confused today than on previous visit:  she was stated: "we should make wallets or something and sell them and put this inside (call bell)"      Exercises Amputee Exercises Hip Extension: AROM;Right;10 reps;Supine Knee Extension: AROM;Both;10 reps;Seated    General Comments General comments (skin integrity, edema, etc.): blisters on dorsum of L foot, intact pre- and post-EOB activity.       Pertinent Vitals/Pain Pain Assessment: 0-10 Pain Score: 7  Pain Location: R residual limb Pain Descriptors / Indicators: Sore  Pain Intervention(s): Monitored during session;Limited activity within patient's tolerance;Repositioned    Home Living                      Prior Function            PT Goals (current goals can now be found in the care plan section) Acute Rehab PT Goals Patient Stated Goal: none stated PT Goal Formulation: Patient unable to participate in goal setting Time For Goal Achievement: 08/15/18 Potential to Achieve Goals: Fair Progress towards PT goals: Progressing toward goals    Frequency    Min 3X/week      PT  Plan Current plan remains appropriate    Co-evaluation              AM-PAC PT "6 Clicks" Mobility   Outcome Measure  Help needed turning from your back to your side while in a flat bed without using bedrails?: A Little Help needed moving from lying on your back to sitting on the side of a flat bed without using bedrails?: A Lot Help needed moving to and from a bed to a chair (including a wheelchair)?: A Lot Help needed standing up from a chair using your arms (e.g., wheelchair or bedside chair)?: A Lot Help needed to walk in hospital room?: A Lot Help needed climbing 3-5 steps with a railing? : Total 6 Click Score: 12    End of Session   Activity Tolerance: Patient tolerated treatment well Patient left: in bed;with bed alarm set;with call bell/phone within reach;with nursing/sitter in room Nurse Communication: Mobility status;Need for lift equipment PT Visit Diagnosis: Muscle weakness (generalized) (M62.81);Other abnormalities of gait and mobility (R26.89)     Time: 3202-3343 PT Time Calculation (min) (ACUTE ONLY): 32 min  Charges:  $Therapeutic Exercise: 8-22 mins $Therapeutic Activity: 8-22 mins                     Alexa Terrial Rhodes, PT Acute Rehabilitation Services Pager [phone]  Office [phone]   Alexa D Eure 08/11/2018, 5:05 PM

## 2018-08-11 NOTE — Consult Note (Signed)
PV Navigator Consult acknowledged. Chart Reviewed. Working from remote location.  Admitted 08/29/18 for persistent abdominal pain, loose stools and cold left foot. Diagnostic laparoscopy shows acute gastric ulcer w/perforation resulting in emergent sx and placement of omental patch. Vascular consult due to progressive ischemic changes bil. lower extremities.  08/06/18 underwent bil. popliteal and tibial embolectomies, left leg fasciotomy with Dr Arbie Cookey. R lower extremity unable to be salvaged and R BKA performed on 08/08/18. Per notes incision R BKA and fasciotomy incisions appear to be healing at this time. Left lower extremity being followed closely for ischemic changes requiring additional surgical intervention. Will continue to follow during her hospitalization and will call patient to establish any barriers for discharge. Thank you for this consult. Hilma Favors RN BSN CWS PV Navigator [place]

## 2018-08-12 NOTE — Progress Notes (Addendum)
[facility]  VASCULAR SURGERY ASSESSMENT & PLAN:   POD 6 BILATERAL POPLITEAL AND TIBIAL EMBOLECTOMY'S WITH LEFT LATERAL AND MEDIAL FASCIOTOMY: Left foot is warm.  She has a footdrop on the left and has her boot in place.  POD 4 Right BKA: This is healing nicely.  SNF vs CIR  Natasha Ferrari, MD, FACS Beeper [phone] Office: [phone]    Subjective  - Tearful and worried about the future.   Objective (!) 142/78 83 98.5 F (36.9 C) (Oral) (!) 21 99%  Intake/Output Summary (Last 24 hours) at 08/12/2018 0705 Last data filed at 08/11/2018 2145 Gross per 24 hour  Intake 120 ml  Output 800 ml  Net -680 ml    Right stump appears to be viable, incision intact Left AFO replaced this am for foot drop prevention.  Foot warm and well perfused.  Assessment/Planning: 63 y.o.femalestable overall. Still no explanation as to emboli to her feet, kidney and spleen.  Right stump appears viable Left Foot appears viable from a vascular point of view, but minimal neurologic function.    Pending SNF verses CIR  Mosetta Pigeon 08/12/2018 7:05 AM --  Laboratory Lab Results: Recent Labs    08/10/18 0303  WBC 18.8*  HGB 8.6*  HCT 26.5*  PLT 883*   BMET Recent Labs    08/10/18 0303  NA 134*  K 3.4*  CL 98  CO2 27  GLUCOSE 100*  BUN 7*  CREATININE 0.42*  CALCIUM 8.0*    COAG Lab Results  Component Value Date   INR 0.93 07/09/2014   No results found for: PTT

## 2018-08-12 NOTE — Progress Notes (Signed)
Orthopedic Tech Progress Note Patient Details:  Natasha Frederick [birth year] [id number] Called in order to [facility] Patient ID: Natasha Frederick, female   DOB: 1956-02-13, 63 y.o.   MRN: [id number]   [name] 08/12/2018, 8:38 AM

## 2018-08-12 NOTE — Progress Notes (Addendum)
4 Days Post-Op  Subjective: Denies abdominal pain, N/V. Tolerating diet. Passing flatus. No BM yesterday. Was in recliner yesterday with OT.   Objective: Vital signs in last 24 hours: Temp:  [98.1 F (36.7 C)-98.7 F (37.1 C)] 98.7 F (37.1 C) (05/05 0824) Pulse Rate:  [43-90] 43 (05/05 0824) Resp:  [18-23] 19 (05/05 0824) BP: (102-149)/(75-99) 102/88 (05/05 0824) SpO2:  [94 %-99 %] 99 % (05/05 0354) Last BM Date: 08/10/18  Intake/Output from previous day: 05/04 0701 - 05/05 0700 In: 120 [P.O.:120] Out: 800 [Urine:800] Intake/Output this shift: Total I/O In: 140 [P.O.:140] Out: 1400 [Urine:1400]  PE: Gen: Awake and alert, NAD Heart: regular Lungs: CTAB Abd: soft, nontender, ND, +BS,incision c/d/i without erythema. No signs of infection where drain was removed.  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   Lab Results:  Recent Labs    08/10/18 0303  WBC 18.8*  HGB 8.6*  HCT 26.5*  PLT 883*   BMET Recent Labs    08/10/18 0303  NA 134*  K 3.4*  CL 98  CO2 27  GLUCOSE 100*  BUN 7*  CREATININE 0.42*  CALCIUM 8.0*   PT/INR No results for input(s): LABPROT, INR in the last 72 hours. CMP     Component Value Date/Time   NA 134 (L) 08/10/2018 0303   K 3.4 (L) 08/10/2018 0303   CL 98 08/10/2018 0303   CO2 27 08/10/2018 0303   GLUCOSE 100 (H) 08/10/2018 0303   BUN 7 (L) 08/10/2018 0303   CREATININE 0.42 (L) 08/10/2018 0303   CALCIUM 8.0 (L) 08/10/2018 0303   PROT 5.8 (L) 07/30/2018 2001   ALBUMIN 3.2 (L) 07/30/2018 2001   AST 44 (H) 07/30/2018 2001   ALT 23 07/30/2018 2001   ALKPHOS 52 07/30/2018 2001   BILITOT 0.8 07/30/2018 2001   GFRNONAA >60 08/10/2018 0303   GFRAA >60 08/10/2018 0303   Lipase     Component Value Date/Time   LIPASE 130 (H) 07/30/2018 2001       Studies/Results: No results found.  Anti-infectives: Anti-infectives (From admission, onward)    Start     Dose/Rate Route Frequency Ordered Stop   08/08/18 2000  vancomycin (VANCOCIN) IVPB 1000 mg/200 mL premix     1,000 mg 200 mL/hr over 60 Minutes Intravenous Every 12 hours 08/08/18 1004 08/09/18 0925   08/08/18 0830  vancomycin (VANCOCIN) IVPB 1000 mg/200 mL premix  Status:  Discontinued     1,000 mg 200 mL/hr over 60 Minutes Intravenous To Surgery 08/08/18 0828 08/08/18 1004   08/08/18 0715  vancomycin (VANCOCIN) IVPB 1000 mg/200 mL premix     1,000 mg 200 mL/hr over 60 Minutes Intravenous To ShortStay Surgical 08/07/18 1624 08/09/18 0715   07/31/18 0600  clindamycin (CLEOCIN) IVPB 900 mg     900 mg 100 mL/hr over 30 Minutes Intravenous On call to O.R. 07/30/18 2208 07/30/18 2352   07/31/18 0600  gentamicin (GARAMYCIN) 310 mg in dextrose 5 % 100 mL IVPB     5 mg/kg  61.2 kg 107.8 mL/hr over 60 Minutes Intravenous On call to O.R. 07/30/18 2208 07/31/18 0006   07/31/18 0600  cefTRIAXone (ROCEPHIN) 2 g in sodium chloride 0.9 % 100 mL IVPB    Note to Pharmacy:  Pharmacy may adjust dosing strength / duration / interval for maximal efficacy   2 g 200 mL/hr over 30 Minutes Intravenous Daily 07/31/18 0258 08/04/18 0616  07/31/18 0400  metroNIDAZOLE (FLAGYL) IVPB 500 mg     500 mg 100 mL/hr over 60 Minutes Intravenous Every 6 hours 07/31/18 0258 08/01/18 0546   07/31/18 0003  clindamycin (CLEOCIN) 900 mg, gentamicin (GARAMYCIN) 240 mg in sodium chloride 0.9 % 1,000 mL for intraperitoneal lavage  Status:  Discontinued       As needed 07/31/18 0003 07/31/18 0052   07/30/18 2249  clindamycin (CLEOCIN) 900 MG/50ML IVPB    Note to Pharmacy:  Mirian Moarver, [place]   : cabinet override      07/30/18 2249 07/30/18 2322   07/30/18 2215  clindamycin (CLEOCIN) 900 mg, gentamicin (GARAMYCIN) 240 mg in sodium chloride 0.9 % 1,000 mL for intraperitoneal lavage  Status:  Discontinued    Note to Pharmacy:  Have in the  OR room for final irrigation in bowel surgery case to minimize risk of  abscess/infection Pharmacy may adjust dosing strength, schedule, rate of infusion, etc as needed to optimize therapy    Irrigation To Surgery 07/30/18 2208 07/31/18 1024   07/30/18 2130  ceFEPIme (MAXIPIME) 2 g in sodium chloride 0.9 % 100 mL IVPB     2 g 200 mL/hr over 30 Minutes Intravenous  Once 07/30/18 2129 07/30/18 2232   07/30/18 2130  metroNIDAZOLE (FLAGYL) IVPB 500 mg     500 mg 100 mL/hr over 60 Minutes Intravenous  Once 07/30/18 2129 07/30/18 2306       Assessment/Plan Acute kidney disease - Cr 0.42 on 5/3 Dehydration Huntington's disease Scoliosis Osteoarthritis Hx of LONG term NSAID use ABL Anemia - Hgb stable at 8.6 5/3. VSS. 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 Splenic vaccines given on 5/5. Will need PCP for pneumococcal 23 in 8 wks BLE ischemia, POD4, s/pBilateral popliteal and tibial embolectomies. Left lateral and medial fasciotomy, Dr. Tawanna Coolerodd Early, 5/1 POD4, s/p right BKA -care per vascular surgery. Appreciate their assistance Perforated gastric ulcer S/p Omental [name] patich, diagnostic laparoscopy, washout/drainage of intraabdominal abscess, 07/30/18, Dr. Viviann SpareSteven GrossPOD#13 - Needs EGD~6weeks post-op to r/o CA & see if ulcer healed. Avoid NSAIDs -Soft diet.  -OOB to chair as able  - IS  ZOX:WRUEFEN:soft diet, Magic Cup TID per nutrition, add back miralax ID: Maxipime 4/22 x 1; Flagyl 4/22 - 4/24;Rocephin 4/23 - 4/27. Vanc 4/30 - 5/2 DVT: Lovenox  Foley - removed 4/25 Follow up: Dr. Karie SodaSteven Gross, Vascular Surgery, PCP-vaccines, GI-EGD POC: Spouse Rickard RhymesLewis Threat [phone]  Dispo: CIR recommended SNF. Continue working with therapies. AM labs.     LOS: 13 days    Jacinto HalimMichael M  , [facility] 08/12/2018, 9:38 AM Pager: [phone]

## 2018-08-12 NOTE — Progress Notes (Signed)
Patient sleeping had a short run SVT then back to S.R.

## 2018-08-12 NOTE — Discharge Instructions (Signed)
[facility] ° °Please arrive at least 30 min before your appointment to complete your check in paperwork.  If you are unable to arrive 30 min prior to your appointment time we may have to cancel or reschedule you. °LAPAROSCOPIC SURGERY: POST OP INSTRUCTIONS °Always review your discharge instruction sheet given to you by the facility where your surgery was performed. °IF YOU HAVE DISABILITY OR FAMILY LEAVE FORMS, YOU MUST BRING THEM TO THE OFFICE FOR PROCESSING.   °DO NOT GIVE THEM TO YOUR DOCTOR. ° °PAIN CONTROL ° °1. First take acetaminophen (Tylenol) AND/or ibuprofen (Advil) to control your pain after surgery.  Follow directions on package.  Taking acetaminophen (Tylenol) and/or ibuprofen (Advil) regularly after surgery will help to control your pain and lower the amount of prescription pain medication you may need.  You should not take more than 4,000 mg (4 grams) of acetaminophen (Tylenol) in 24 hours.  You should not take ibuprofen (Advil), aleve, motrin, naprosyn or other NSAIDS if you have a history of stomach ulcers or chronic kidney disease.  °2. A prescription for pain medication may be given to you upon discharge.  Take your pain medication as prescribed, if you still have uncontrolled pain after taking acetaminophen (Tylenol) or ibuprofen (Advil). °3. Use ice packs to help control pain. °4. If you need a refill on your pain medication, please contact your pharmacy.  They will contact our office to request authorization. Prescriptions will not be filled after 5pm or on week-ends. ° °HOME MEDICATIONS °5. Take your usually prescribed medications unless otherwise directed. ° °DIET °6. You should follow a light diet the first few days after arrival home.  Be sure to include lots of fluids daily. Avoid fatty, fried foods.  ° °CONSTIPATION °7. It is common to experience some constipation after surgery and if you are taking pain medication.  Increasing fluid intake and taking a stool  softener (such as Colace) will usually help or prevent this problem from occurring.  A mild laxative (Milk of Magnesia or Miralax) should be taken according to package instructions if there are no bowel movements after 48 hours. ° °WOUND/INCISION CARE °8. Most patients will experience some swelling and bruising in the area of the incisions.  Ice packs will help.  Swelling and bruising can take several days to resolve.  °9. Unless discharge instructions indicate otherwise, follow guidelines below  °a. STERI-STRIPS - you may remove your outer bandages 48 hours after surgery, and you may shower at that time.  You have steri-strips (small skin tapes) in place directly over the incision.  These strips should be left on the skin for 7-10 days.   °b. DERMABOND/SKIN GLUE - you may shower in 24 hours.  The glue will flake off over the next 2-3 weeks. °10. Any sutures or staples will be removed at the office during your follow-up visit. ° °ACTIVITIES °11. You may resume regular (light) daily activities beginning the next day--such as daily self-care, walking, climbing stairs--gradually increasing activities as tolerated.  You may have sexual intercourse when it is comfortable.  Refrain from any heavy lifting or straining until approved by your doctor. °a. You may drive when you are no longer taking prescription pain medication, you can comfortably wear a seatbelt, and you can safely maneuver your car and apply brakes. ° °FOLLOW-UP °12. You should see your doctor in the office for a follow-up appointment approximately 2-3 weeks after your surgery.  You should have been given your post-op/follow-up appointment when   your surgery was scheduled.  If you did not receive a post-op/follow-up appointment, make sure that you call for this appointment within a day or two after you arrive home to insure a convenient appointment time.   WHEN TO CALL YOUR DOCTOR: 1. Fever over 101.0 2. Inability to urinate 3. Continued bleeding from  incision. 4. Increased pain, redness, or drainage from the incision. 5. Increasing abdominal pain  The clinic staff is available to answer your questions during regular business hours.  Please dont hesitate to call and ask to speak to one of the nurses for clinical concerns.  If you have a medical emergency, go to the nearest emergency room or call 911.  A [facility] from [facility] is always on call at the hospital. [address], [address], [place], [place]  [id number] ? [address] [address], [place], [place]   [id number] [phone] ? [phone] ? FAX [phone]  Peptic Ulcer Eating Plan Peptic ulcers are sores that form on the lining of the stomach, esophagus, or the part of the small intestine that is attached to the stomach (duodenum). These sores are also called stomach ulcers. When ulcers develop, they can cause a burning feeling in the stomach as well as bloating, nausea, vomiting, and poor appetite. If you have a history of peptic ulcers, it is important to keep track of what foods and drinks cause symptoms. What are tips for following this plan?   Eat a healthy, well-balanced diet. This includes: ? Fresh fruits and vegetables. Eat a variety of colors of fruits and vegetables. ? Whole grains. Try to make sure at least half of the grains you eat each day are whole grains. ? Low-fat dairy. ? Lean meat, fish, poultry, eggs, beans, and nuts. ? Healthy fats, such as olive oil, grapeseed oil, or canola oil. Try to eat less than 8 teaspoons of fats and oils each day.  Avoid foods that cause irritation or pain. These may be different for different people. Keep a food diary to identify foods that cause symptoms.  Avoid processed foods that have added salt and sugar.  Avoid drinking alcohol.  Avoid drinks with caffeine, such as cola, black tea, energy drinks, and coffee. Recommended foods Grains  Whole grains. Vegetables  All fresh or frozen vegetables. Low-sodium  canned vegetables. Fruits  All fresh, frozen, or dried fruit. Fruit canned in juice. Meats and other protein foods  Lean cuts of meat. Skinless poultry. Fresh or canned fish. Eggs. Tofu. Nuts and nut butter. Dried beans. Low-sodium canned beans. Dairy  Low-fat or nonfat (skim) milk. Nonfat or low-fat yogurt. Nonfat or low-fat cheese. Beverages  Water. Soy or nut milks. Caffeine-free soft drinks. Herbal tea. Fats and oils  Olive oil. Canola oil. Grapeseed oil. Sunflower oil. Seasoning and other foods  Low-fat salad dressing. Ketchup. Low-fat mayonnaise. All spices except pepper. Low-sodium seasoning mixes. Foods to avoid Meats and other protein foods  Fatty meats. Fried meats. Any meat that causes symptoms. Dairy  Whole milk. Ice cream. Cream. Chocolate milk. Beverages  Alcohol. Coffee. Cola and energy drinks. Black or green tea. Cocoa. Fats and oils  Butter. Lard. Ghee. Seasoning and other foods  Pepper. Hot sauce. Any seasonings or condiments that cause symptoms. Summary  Peptic ulcers can cause burning in the stomach as well as bloating, nausea, vomiting, and poor appetite. You may be able to limit symptoms by avoiding foods that make you feel worse.  Work with your dietitian or health care provider to identify  foods that cause symptoms. This may include caffeinated drinks, alcohol, or pepper. This information is not intended to replace advice given to you by your health care provider. Make sure you discuss any questions you have with your health care provider. Document Released: 06/18/2011 Document Revised: 05/07/2016 Document Reviewed: 05/07/2016 Elsevier Interactive Patient Education  2019 Elsevier Inc.  DO NOT TAKE NSAIDS (IBUPROFEN, NAPROXEN, GOODY POWDERS, BC POWDERS etc).

## 2018-08-13 ENCOUNTER — Telehealth: Payer: Self-pay | Admitting: Vascular Surgery

## 2018-08-13 DIAGNOSIS — G10 Huntington's disease: Secondary | ICD-10-CM | POA: Diagnosis not present

## 2018-08-13 DIAGNOSIS — K668 Other specified disorders of peritoneum: Secondary | ICD-10-CM | POA: Diagnosis present

## 2018-08-13 DIAGNOSIS — S0101XA Laceration without foreign body of scalp, initial encounter: Secondary | ICD-10-CM | POA: Diagnosis not present

## 2018-08-13 DIAGNOSIS — R52 Pain, unspecified: Secondary | ICD-10-CM | POA: Diagnosis not present

## 2018-08-13 DIAGNOSIS — F331 Major depressive disorder, recurrent, moderate: Secondary | ICD-10-CM | POA: Diagnosis not present

## 2018-08-13 DIAGNOSIS — Y999 Unspecified external cause status: Secondary | ICD-10-CM | POA: Diagnosis not present

## 2018-08-13 DIAGNOSIS — I7410 Embolism and thrombosis of unspecified parts of aorta: Secondary | ICD-10-CM | POA: Diagnosis not present

## 2018-08-13 DIAGNOSIS — R51 Headache: Secondary | ICD-10-CM | POA: Diagnosis not present

## 2018-08-13 DIAGNOSIS — Y939 Activity, unspecified: Secondary | ICD-10-CM | POA: Diagnosis not present

## 2018-08-13 DIAGNOSIS — M19072 Primary osteoarthritis, left ankle and foot: Secondary | ICD-10-CM | POA: Diagnosis not present

## 2018-08-13 DIAGNOSIS — Z87891 Personal history of nicotine dependence: Secondary | ICD-10-CM | POA: Diagnosis not present

## 2018-08-13 DIAGNOSIS — F321 Major depressive disorder, single episode, moderate: Secondary | ICD-10-CM | POA: Diagnosis not present

## 2018-08-13 DIAGNOSIS — R296 Repeated falls: Secondary | ICD-10-CM | POA: Diagnosis not present

## 2018-08-13 DIAGNOSIS — R0902 Hypoxemia: Secondary | ICD-10-CM | POA: Diagnosis not present

## 2018-08-13 DIAGNOSIS — R279 Unspecified lack of coordination: Secondary | ICD-10-CM | POA: Diagnosis not present

## 2018-08-13 DIAGNOSIS — Z743 Need for continuous supervision: Secondary | ICD-10-CM | POA: Diagnosis not present

## 2018-08-13 DIAGNOSIS — D735 Infarction of spleen: Secondary | ICD-10-CM | POA: Diagnosis not present

## 2018-08-13 DIAGNOSIS — I998 Other disorder of circulatory system: Secondary | ICD-10-CM | POA: Diagnosis not present

## 2018-08-13 DIAGNOSIS — Z79899 Other long term (current) drug therapy: Secondary | ICD-10-CM | POA: Diagnosis not present

## 2018-08-13 DIAGNOSIS — R1084 Generalized abdominal pain: Secondary | ICD-10-CM | POA: Diagnosis not present

## 2018-08-13 DIAGNOSIS — M1612 Unilateral primary osteoarthritis, left hip: Secondary | ICD-10-CM | POA: Diagnosis present

## 2018-08-13 DIAGNOSIS — T8143XA Infection following a procedure, organ and space surgical site, initial encounter: Secondary | ICD-10-CM | POA: Diagnosis not present

## 2018-08-13 DIAGNOSIS — G894 Chronic pain syndrome: Secondary | ICD-10-CM | POA: Diagnosis not present

## 2018-08-13 DIAGNOSIS — R5381 Other malaise: Secondary | ICD-10-CM | POA: Diagnosis not present

## 2018-08-13 DIAGNOSIS — W06XXXA Fall from bed, initial encounter: Secondary | ICD-10-CM | POA: Diagnosis not present

## 2018-08-13 DIAGNOSIS — F419 Anxiety disorder, unspecified: Secondary | ICD-10-CM | POA: Diagnosis not present

## 2018-08-13 DIAGNOSIS — K5900 Constipation, unspecified: Secondary | ICD-10-CM | POA: Diagnosis not present

## 2018-08-13 DIAGNOSIS — R451 Restlessness and agitation: Secondary | ICD-10-CM | POA: Diagnosis not present

## 2018-08-13 DIAGNOSIS — R58 Hemorrhage, not elsewhere classified: Secondary | ICD-10-CM | POA: Diagnosis not present

## 2018-08-13 DIAGNOSIS — W19XXXA Unspecified fall, initial encounter: Secondary | ICD-10-CM | POA: Diagnosis not present

## 2018-08-13 DIAGNOSIS — K251 Acute gastric ulcer with perforation: Secondary | ICD-10-CM | POA: Diagnosis not present

## 2018-08-13 DIAGNOSIS — S0990XA Unspecified injury of head, initial encounter: Secondary | ICD-10-CM | POA: Diagnosis present

## 2018-08-13 DIAGNOSIS — Z96642 Presence of left artificial hip joint: Secondary | ICD-10-CM | POA: Diagnosis not present

## 2018-08-13 DIAGNOSIS — Z7401 Bed confinement status: Secondary | ICD-10-CM | POA: Diagnosis not present

## 2018-08-13 DIAGNOSIS — Z4781 Encounter for orthopedic aftercare following surgical amputation: Secondary | ICD-10-CM | POA: Diagnosis not present

## 2018-08-13 DIAGNOSIS — Z23 Encounter for immunization: Secondary | ICD-10-CM | POA: Diagnosis not present

## 2018-08-13 DIAGNOSIS — Y92199 Unspecified place in other specified residential institution as the place of occurrence of the external cause: Secondary | ICD-10-CM | POA: Diagnosis not present

## 2018-08-13 DIAGNOSIS — M2550 Pain in unspecified joint: Secondary | ICD-10-CM | POA: Diagnosis not present

## 2018-08-13 LAB — BASIC METABOLIC PANEL
Anion gap: 10 (ref 5–15)
BUN: 5 mg/dL — ABNORMAL LOW (ref 8–23)
CO2: 23 mmol/L (ref 22–32)
Calcium: 8.1 mg/dL — ABNORMAL LOW (ref 8.9–10.3)
Chloride: 103 mmol/L (ref 98–111)
Creatinine, Ser: 0.52 mg/dL (ref 0.44–1.00)
GFR calc Af Amer: >60 mL/min (ref >60)
GFR calc non Af Amer: >60 mL/min (ref >60)
Glucose, Bld: 93 mg/dL (ref 70–99)
Potassium: 3.3 mmol/L — ABNORMAL LOW (ref 3.5–5.1)
Sodium: 136 mmol/L (ref 135–145)

## 2018-08-13 LAB — CBC
HCT: 27.4 % — ABNORMAL LOW (ref 36.0–46.0)
Hemoglobin: 8.8 g/dL — ABNORMAL LOW (ref 12.0–15.0)
MCH: 28 pg (ref 26.0–34.0)
MCHC: 32.1 g/dL (ref 30.0–36.0)
MCV: 87.3 fL (ref 80.0–100.0)
Platelets: 1200 10*3/uL (ref 150–400)
RBC: 3.14 MIL/uL — ABNORMAL LOW (ref 3.87–5.11)
RDW: 13.9 % (ref 11.5–15.5)
WBC: 16.2 10*3/uL — ABNORMAL HIGH (ref 4.0–10.5)
nRBC: 0 % (ref 0.0–0.2)

## 2018-08-13 MED ORDER — ADULT MULTIVITAMIN W/MINERALS CH: 1.0000 | ORAL_TABLET | Freq: Every day | ORAL | 0 refills | Status: DC

## 2018-08-13 MED ORDER — PANTOPRAZOLE SODIUM 40 MG PO TBEC: 40.0000 mg | DELAYED_RELEASE_TABLET | Freq: Two times a day (BID) | ORAL | 1 refills | Status: DC

## 2018-08-13 MED ORDER — METHOCARBAMOL 500 MG PO TABS: 500.0000 mg | ORAL_TABLET | Freq: Three times a day (TID) | ORAL | 0 refills | Status: DC

## 2018-08-13 MED ORDER — POTASSIUM CHLORIDE CRYS ER 20 MEQ PO TBCR
40.0000 meq | EXTENDED_RELEASE_TABLET | Freq: Two times a day (BID) | ORAL | Status: DC
  Filled 2018-08-13: qty 2

## 2018-08-13 MED ORDER — OXYCODONE-ACETAMINOPHEN 5-325 MG PO TABS: 1.0000 | ORAL_TABLET | Freq: Four times a day (QID) | ORAL | 0 refills | Status: DC | PRN

## 2018-08-13 MED ORDER — POLYETHYLENE GLYCOL 3350 17 G PO PACK: 17.0000 g | PACK | Freq: Every day | ORAL | 0 refills | Status: DC

## 2018-08-13 NOTE — Telephone Encounter (Signed)
sch appt lvm mld ltr 09/09/2018 1020am staple removal MD

## 2018-08-13 NOTE — TOC Transition Note (Addendum)
Transition of Care [facility]) - CM/SW Discharge Note Donn Pierini RN, BSN Transitions of Care Unit 4E- RN Case Manager [phone]   Patient Details  Name: Natasha Frederick MRN: [id number] Date of Birth: 13-Jan-1956  Transition of Care [facility]) CM/SW Contact:  Darrold Span, RN Phone Number: 08/13/2018, 3:15 PM   Clinical Narrative:    Pt stable for transition to [facility], have spoken with Revonda Standard and confirmed bed available for transition to SNF today- facility awaiting husband to complete paperwork prior to transport. Number for RN to call report is [phone]- room 121. D/C summary has been faxed via Hub to [facility].  1530-update - have spoken with Revonda Standard at [facility]- husband has completed needed paperwork and patient can be transported to facility. RN given room # 121 - confirmed [place] has received D/C summary- PTAR called for transport and paperwork placed on shadow chart.   Final next level of care: Skilled Nursing Facility Barriers to Discharge: No Barriers Identified   Patient Goals and CMS Choice Patient states their goals for this hospitalization and ongoing recovery are:: Per husband - to have patient return home after PT in SNF CMS Medicare.gov Compare Post Acute Care list provided to:: Patient Represenative (must comment)(Spouse) Choice offered to / list presented to : Spouse  Discharge Placement  Pt to transition to [facility]NF- [facility] and Services In-house Referral: Clinical Social Work Discharge Planning Services: CM Consult Post Acute Care Choice: Skilled Nursing Facility          DME Arranged: N/A DME Agency: NA       HH Arranged: NA HH Agency: NA        Social Determinants of Health (SDOH) Interventions     Readmission Risk Interventions Readmission Risk Prevention Plan 08/13/2018  Transportation Screening Complete  PCP or Specialist Appt within 5-7 Days Complete  Home Care  Screening Complete  Medication Review (RN CM) Complete  Some recent data might be hidden

## 2018-08-13 NOTE — Progress Notes (Signed)
5 Days Post-Op  Subjective: Denies abdominal pain, N/V. Tolerating diet. Passing flatus. BM yesterday. Passing flatus. Awaiting SNF.   Objective: Vital signs in last 24 hours: Temp:  [97.6 F (36.4 C)-98.6 F (37 C)] 98.6 F (37 C) (05/06 0320) Pulse Rate:  [69-82] 69 (05/06 0320) Resp:  [13-20] 20 (05/06 0320) BP: (140-145)/(89-91) 145/91 (05/06 0320) SpO2:  [90 %-94 %] 90 % (05/06 0320) Last BM Date: 08/10/18  Intake/Output from previous day: 05/05 0701 - 05/06 0700 In: 500 [P.O.:500] Out: 2000 [Urine:2000] Intake/Output this shift: Total I/O In: 160 [P.O.:160] Out: -   PE: Gen: Awake and alert, NAD Heart: regular Lungs: CTAB Abd: soft, nontender, ND, +BS,incision c/d/i without erythema. No signs of infection where drain was removed. 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   Lab Results:  Recent Labs    08/13/18 0647  WBC 16.2*  HGB 8.8*  HCT 27.4*  PLT 1,200*   BMET Recent Labs    08/13/18 0647  NA 136  K 3.3*  CL 103  CO2 23  GLUCOSE 93  BUN 5*  CREATININE 0.52  CALCIUM 8.1*   PT/INR No results for input(s): LABPROT, INR in the last 72 hours. CMP     Component Value Date/Time   NA 136 08/13/2018 0647   K 3.3 (L) 08/13/2018 0647   CL 103 08/13/2018 0647   CO2 23 08/13/2018 0647   GLUCOSE 93 08/13/2018 0647   BUN 5 (L) 08/13/2018 0647   CREATININE 0.52 08/13/2018 0647   CALCIUM 8.1 (L) 08/13/2018 0647   PROT 5.8 (L) 07/30/2018 2001   ALBUMIN 3.2 (L) 07/30/2018 2001   AST 44 (H) 07/30/2018 2001   ALT 23 07/30/2018 2001   ALKPHOS 52 07/30/2018 2001   BILITOT 0.8 07/30/2018 2001   GFRNONAA >60 08/13/2018 0647   GFRAA >60 08/13/2018 0647   Lipase     Component Value Date/Time   LIPASE 130 (H) 07/30/2018 2001       Studies/Results: No results found.  Anti-infectives: Anti-infectives (From admission, onward)   Start      Dose/Rate Route Frequency Ordered Stop   08/08/18 2000  vancomycin (VANCOCIN) IVPB 1000 mg/200 mL premix     1,000 mg 200 mL/hr over 60 Minutes Intravenous Every 12 hours 08/08/18 1004 08/09/18 0925   08/08/18 0830  vancomycin (VANCOCIN) IVPB 1000 mg/200 mL premix  Status:  Discontinued     1,000 mg 200 mL/hr over 60 Minutes Intravenous To Surgery 08/08/18 0828 08/08/18 1004   08/08/18 0715  vancomycin (VANCOCIN) IVPB 1000 mg/200 mL premix     1,000 mg 200 mL/hr over 60 Minutes Intravenous To ShortStay Surgical 08/07/18 1624 08/09/18 0715   07/31/18 0600  clindamycin (CLEOCIN) IVPB 900 mg     900 mg 100 mL/hr over 30 Minutes Intravenous On call to O.R. 07/30/18 2208 07/30/18 2352   07/31/18 0600  gentamicin (GARAMYCIN) 310 mg in dextrose 5 % 100 mL IVPB     5 mg/kg  61.2 kg 107.8 mL/hr over 60 Minutes Intravenous On call to O.R. 07/30/18 2208 07/31/18 0006   07/31/18 0600  cefTRIAXone (ROCEPHIN) 2 g in sodium chloride 0.9 % 100 mL IVPB    Note to Pharmacy:  Pharmacy may adjust dosing strength / duration / interval for maximal efficacy   2 g 200 mL/hr over 30 Minutes Intravenous Daily 07/31/18 0258 08/04/18 0616   07/31/18  0400  metroNIDAZOLE (FLAGYL) IVPB 500 mg     500 mg 100 mL/hr over 60 Minutes Intravenous Every 6 hours 07/31/18 0258 08/01/18 0546   07/31/18 0003  clindamycin (CLEOCIN) 900 mg, gentamicin (GARAMYCIN) 240 mg in sodium chloride 0.9 % 1,000 mL for intraperitoneal lavage  Status:  Discontinued       As needed 07/31/18 0003 07/31/18 0052   07/30/18 2249  clindamycin (CLEOCIN) 900 MG/50ML IVPB    Note to Pharmacy:  Mirian Moarver, [place]   : cabinet override      07/30/18 2249 07/30/18 2322   07/30/18 2215  clindamycin (CLEOCIN) 900 mg, gentamicin (GARAMYCIN) 240 mg in sodium chloride 0.9 % 1,000 mL for intraperitoneal lavage  Status:  Discontinued    Note to Pharmacy:  Have in the  OR room for final irrigation in bowel surgery case to minimize risk of abscess/infection Pharmacy  may adjust dosing strength, schedule, rate of infusion, etc as needed to optimize therapy    Irrigation To Surgery 07/30/18 2208 07/31/18 1024   07/30/18 2130  ceFEPIme (MAXIPIME) 2 g in sodium chloride 0.9 % 100 mL IVPB     2 g 200 mL/hr over 30 Minutes Intravenous  Once 07/30/18 2129 07/30/18 2232   07/30/18 2130  metroNIDAZOLE (FLAGYL) IVPB 500 mg     500 mg 100 mL/hr over 60 Minutes Intravenous  Once 07/30/18 2129 07/30/18 2306       Assessment/Plan Acute kidney disease- Cr 0.52 on 5/6 Dehydration Huntington's disease Scoliosis Osteoarthritis Hx of LONG term NSAID use ABL Anemia - Hgb stable at 8.8 5/6. VSS. 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 Splenic vaccinesgiven on 5/5. Will need PCP for pneumococcal 23 in 8 wks. Plt elevated 5/6 likely 2/2 splenic infarct. BLE ischemia, POD5, s/pBilateral popliteal and tibial embolectomies. Left lateral and medial fasciotomy, Dr. Tawanna Coolerodd Early, 5/1 POD5, s/p right BKA -care per vascular surgery. Appreciate their assistance Perforated gastric ulcer S/p Omental [name] patich, diagnostic laparoscopy, washout/drainage of intraabdominal abscess, 07/30/18, Dr. Viviann SpareSteven GrossPOD#14 - Needs EGD~6weeks post-op to r/o CA & see if ulcer healed. Avoid NSAIDs -Soft diet.  -OOB to chair as able - IS  ZOX:WRUEFEN:soft diet, Magic Cup TID per nutrition, miralax, K 3.3 (replace) ID: Maxipime 4/22 x 1; Flagyl 4/22 - 4/24;Rocephin 4/23 - 4/27. Vanc 4/30 - 5/2 DVT: Lovenox  Foley - removed 4/25 Follow up: Dr. Karie SodaSteven Gross, Vascular Surgery, PCP-vaccines, GI-EGD POC: Spouse Rickard RhymesLewis Oka [phone]  Dispo: Awaiting SNF placement. Continue working with therapies. AM labs.    LOS: 14 days    Jacinto HalimMichael M Maczis , [facility] 08/13/2018, 8:47 AM Pager: [phone]

## 2018-08-13 NOTE — Progress Notes (Signed)
D/C instructions given to PTAR. IV removed, clean and intact. All belongings transported with patient.  Versie Starks, RN

## 2018-08-13 NOTE — Discharge Summary (Signed)
[facility] Discharge Summary   Patient ID: Natasha Frederick MRN: [id number] DOB/AGE: Oct 03, 1955 63 y.o.  Admit date: 07/30/2018 Discharge date: 08/13/2018  Admitting Diagnosis: Abdominal pain with pneumoperitoneum, probable perforated gastric ulcer  Discharge Diagnosis Patient Active Problem List   Diagnosis Date Noted  . Cold right foot 08/06/2018  . Gastric ulcer 08/06/2018  . Delirium 08/01/2018  . Perforated gastric ulcer s/p omental patch 07/30/2018 07/31/2018  . Bile peritonitis (HCC) 07/31/2018  . Pneumoperitoneum 07/30/2018  . Long term current use of non-steroidal anti-inflammatories (NSAID) 07/30/2018  . ARF (acute renal failure) (HCC) 07/30/2018  . 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  . Primary osteoarthritis of left hip 07/19/2014  . Scoliosis of lumbar spine 01/05/2014    Consultants Neurology Vascular surgery  Imaging: No results found.  Procedures 1. Dr. [name] (07/31/18) - OMENTAL (GRAHAM) PATCH, LAPAROSCOPY DIAGNOSTIC, WASHOUT/DRAINAGE OF INTRAABDOMINAL ASBCESSES  2. Dr. Arbie Cookey (08/06/18) - Bilateral popliteal and tibial embolectomies.  Left lateral and medial fasciotomy  3. Dr. Arbie Cookey (08/08/18) - Right below-knee amputation  Hospital Course: Natasha Frederick is a 63yo female PMH Huntington's disease who presented to [facility] 4/22 with 2 weeks of persistent abdominal pain that acutely worsened.  Workup included CT scan which showed large volume of free air primarily focused in upper abdomen, perforated stomach ulcer suspected.  Patient was taken emergently to the OR. Intraoperatively she was found to have a perforated gastric ulcer with biliary peritonitis and intra-abdominal abcesses. She underwent omental graham patch, diagnostic laparoscopy, and washout/drainage of intraabdominal abscesses. Tolerated procedure well and was admitted to the ICU for close monitoring postoperatively. She did suffer from hospital delirium but this improved with time. UGI on 4/25  showed a contained leak, therefore NG tube was clamped and diet gradually advanced as tolerated from there. On 4/29 patient was found to have bilateral lower extremity ischemia. 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 Vascular surgery was consulted and took the patient emergently to the OR 4/29 for bilateral popliteal and tibial embolectomies with left lateral and medial fasciotomy. 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  Patient worked with therapies during this admission. Initially was recommended for inpatient rehab but patient and her husband preferred to go to SNF. On 08/13/18 the patient was voiding well, tolerating diet, working well with therapies, pain well controlled, vital signs stable, incisions c/d/i and felt stable for discharge to SNF.  Patient will follow up as below and knows to call with questions or concerns.    I have personally reviewed the patients medication history on the [place] controlled substance database.    Physical Exam: Gen: Awake and alert, NAD Heart: regular Lungs: CTAB Abd: soft, nontender, ND, +BS,incision c/d/i without erythema. No signs of infection where drain was removed. UOH:[id number] pulses DP in left footwith foot drop, small intact blister noted, in foot drop boot; RLE BKAwith staples in place, c/d/i. >>by Leary Roca PA   Allergies as of 08/13/2018      Reactions   Bee Venom Anaphylaxis, Swelling   Nsaids Other (See Comments)   PERFORATED ULCER = NO MORE NSAIDs   Adhesive [tape] Other (See Comments)   redness   Codeine    Penicillins    Sulfa Antibiotics  Medication List    TAKE these  medications   Deutetrabenazine 12 MG Tabs Commonly known as:  Austedo Take 24 mg by mouth 2 (two) times daily.   methocarbamol 500 MG tablet Commonly known as:  ROBAXIN Take 1 tablet (500 mg total) by mouth 3 (three) times daily.   multivitamin with minerals Tabs tablet Take 1 tablet by mouth daily. Start taking on:  Aug 14, 2018   oxyCODONE-acetaminophen 5-325 MG tablet Commonly known as:  PERCOCET/ROXICET Take 1 tablet by mouth every 6 (six) hours as needed.   pantoprazole 40 MG tablet Commonly known as:  PROTONIX Take 1 tablet (40 mg total) by mouth 2 (two) times daily.   polyethylene glycol 17 g packet Commonly known as:  MIRALAX / GLYCOLAX Take 17 g by mouth daily. Start taking on:  Aug 14, 2018         Contact information for follow-up providers    Kristian CoveyBurchette, Bruce W, MD Follow up.   Specialty:  Family Medicine Why:  Please call to make an appointment. You will need a pneumococcal 23 around 10/11/2018 Contact information: [address] [place] [address] [phone]        Larina EarthlyEarly, Todd F, MD. Call.   Specialties:  Vascular Surgery, Cardiology Why:  Please call to schedule an appointment  Contact information: [address] [place] [address] [phone]        Karie SodaGross, Steven, MD. Go on 09/04/2018.   Specialty:  General Surgery Why:  Your appointment is 5/28 at 9:15AM. Please arrive 30 minutes prior to your appointment for paperwork. Please bring a copy of your drivers lisence and insurance card.  Contact information: [address] [place] [address] [phone]        [facility] Follow up.   Specialty:  Gastroenterology Why:  Please call to make an appointment. We recommend a endoscopy the week of 09/08/2018 to evaluate if your ulcer has resolved.  Contact information: [address] [place] [phone]           Contact information for after-discharge care     Destination    [facility] .   Service:  Skilled Nursing Contact information: [address] [place] [id number] [phone]                  Signed: Franne FortsBrooke A Meuth, [facility] 08/13/2018, 11:26 AM Pager: [phone] Mon-Thurs 7:00 am-4:30 pm Fri 7:00 am -11:30 AM Sat-Sun 7:00 am-11:30 am

## 2018-08-13 NOTE — Progress Notes (Addendum)
  Progress Note    08/13/2018 7:50 AM 5 Days Post-Op  Subjective:  No new complaints   Vitals:   08/12/18 2041 08/13/18 0320  BP: 140/89 (!) 145/91  Pulse: 82 69  Resp: 13 20  Temp: 97.6 F (36.4 C) 98.6 F (37 C)  SpO2: 94% 90%   Physical Exam: Lungs:  Non labored Incisions:  R BKA incision healing well; fasciotomy incisions LLE healing well; L ankle blister resolving Neurologic: A&O  CBC    Component Value Date/Time   WBC 16.2 (H) 08/13/2018 0647   RBC 3.14 (L) 08/13/2018 0647   HGB 8.8 (L) 08/13/2018 0647   HCT 27.4 (L) 08/13/2018 0647   PLT 1,200 (HH) 08/13/2018 0647   MCV 87.3 08/13/2018 0647   MCH 28.0 08/13/2018 0647   MCHC 32.1 08/13/2018 0647   RDW 13.9 08/13/2018 0647   LYMPHSABS 0.2 (L) 07/30/2018 2001   MONOABS 0.1 07/30/2018 2001   EOSABS 0.0 07/30/2018 2001   BASOSABS 0.0 07/30/2018 2001    BMET    Component Value Date/Time   NA 136 08/13/2018 0647   K 3.3 (L) 08/13/2018 0647   CL 103 08/13/2018 0647   CO2 23 08/13/2018 0647   GLUCOSE 93 08/13/2018 0647   BUN 5 (L) 08/13/2018 0647   CREATININE 0.52 08/13/2018 0647   CALCIUM 8.1 (L) 08/13/2018 0647   GFRNONAA >60 08/13/2018 0647   GFRAA >60 08/13/2018 0647    INR    Component Value Date/Time   INR 0.93 07/09/2014 1007     Intake/Output Summary (Last 24 hours) at 08/13/2018 0750 Last data filed at 08/13/2018 0300 Gross per 24 hour  Intake 500 ml  Output 1500 ml  Net -1000 ml     Assessment/Plan:  63 y.o. female is s/p R BKA; LLE thrombectomy with fasciotomies 5 Days Post-Op   R BKA healing well LLE perfused well but no neuro function; foot drop boot should be worn when in bed Ok for discharge to CIR vs SNF when bed available Office will arrange wound check/staple removal in another month  Emilie Rutter, PA-C [facility] [phone] 08/13/2018 7:50 AM  I have examined the patient, reviewed and agree with above.  Right below-knee amputation healing quite  nicely.  She is able to straighten her knee with no difficulty.  The suture line is healing and she has no tenderness over her anterior compartment.  The anterior compartment did look dusky at the time of her amputation but does not appear to have any difficulty healing currently.  She continues to have no motor or sensory function in her left foot.  Medial and lateral fasciotomy incisions are healing without difficulty.  Stable from vascular surgery standpoint to transfer to skilled nursing facility.  I discussed her current state with her husband this morning by telephone.  We will see her in the office for staple removal in several weeks following discharge  Gretta Began, MD 08/13/2018 8:36 AM

## 2018-08-13 NOTE — Progress Notes (Signed)
Physical Therapy Treatment Patient Details Name: Natasha BeckmannJudith Frederick MRN: [id number] DOB: 01/05/1956 Today's Date: 08/13/2018    History of Present Illness Pt is a 63 year old female s/p lap Graham patch of perforated gastric ulcer on 07/30/18 by Dr. Michaell CowingGross.  [facility] bilateral popliteal and tibial embolectomies as well as left fasciotomy on 4/29. S/p R BKA 5/1  PMHx significant for Huntington's disease, scoliosis, back surgery    PT Comments    Pt motivated to participate in PT this session. Pt requiring min assist for bed mobility tasks, and max assist for OOB mobility. PT and OT attempted sit to stand in preparation for stand pivot to [facility], but pt very nervous about this s/p R BKA and due to decreased sensation of LLE. Pt able to assist moreso with lateral scoot to drop arm recliner, appears to be more functional for pt at this time. Per RN, pt is going SNF vs CIR due to pt's husband and pt request. Pt plans to d/c today.    Follow Up Recommendations  Supervision/Assistance - 24 hour;CIR     Equipment Recommendations  Wheelchair (measurements PT);Wheelchair cushion (measurements PT)    Recommendations for Other Services       Precautions / Restrictions Precautions Precautions: Fall Precaution Comments:  L LE numbness and drop foot s/p fasciatomy  Restrictions RLE Weight Bearing: Non weight bearing    Mobility  Bed Mobility Overal bed mobility: Needs Assistance Bed Mobility: Rolling;Supine to Sit;Sit to Supine Rolling: Min assist;+2 for safety/equipment   Supine to sit: Min assist Sit to supine: Min assist;HOB elevated   General bed mobility comments: Min assist for rolling for completion of roll, rolling towards L side with use of bed rails. Min assist for supine<>sit for trunk elevation and lowering, LE management, and scooting to and from EOB with use of bed pad.  Transfers Overall transfer level: Needs assistance Equipment used: None Transfers:  Sit to/from Stand;Lateral/Scoot Transfers Sit to Stand: Max assist;+2 safety/equipment;+2 physical assistance;From elevated surface        Lateral/Scoot Transfers: Mod assist;Max assist;+2 physical assistance General transfer comment: Sit to stand x1 with max assit +2 for LLE blocking, power up, and steadying upon standing. Pt very uncomfortable in standing due to decreased sensation of LLE and recent RLE amputation. Max assist +2 for scoot transfer to drop arm recliner for lifting, translation into chair, and posterior guiding assist. Pt required mod assist for scooting once in chair.   Ambulation/Gait Ambulation/Gait assistance: (NT )               Stairs             Wheelchair Mobility    Modified Rankin (Stroke Patients Only)       Balance Overall balance assessment: Needs assistance Sitting-balance support: No upper extremity supported;Feet supported Sitting balance-Leahy Scale: Fair Sitting balance - Comments: min guard-min assist for steadying and return to midline. Pt sat EOB 10 minutes performing ADLs with OT Postural control: Posterior lean   Standing balance-Leahy Scale: Zero Standing balance comment: stood with max assist +2, reliant on PT/OT and RW                            Cognition Arousal/Alertness: Awake/alert Behavior During Therapy: [facility] for tasks assessed/performed;Anxious Overall Cognitive Status: No family/caregiver present to determine baseline cognitive functioning Area of Impairment: Attention;Following commands  Current Attention Level: Selective   Following Commands: Follows one step commands with increased time;Follows one step commands consistently Safety/Judgement: Decreased awareness of safety     General Comments: Pt with anxiety about mobility this session, stating "I can't stand, I have nothing to stand on"      Exercises      General Comments General comments (skin integrity, edema,  etc.): blister on dorsum of L foot, intact       Pertinent Vitals/Pain Pain Assessment: Faces Faces Pain Scale: Hurts a little bit Pain Location: R residual limb, LLE with stretching Pain Descriptors / Indicators: Sore;Grimacing;Guarding Pain Intervention(s): Monitored during session;Repositioned;Limited activity within patient's tolerance    Home Living                      Prior Function            PT Goals (current goals can now be found in the care plan section) Acute Rehab PT Goals Patient Stated Goal: go to rehab and home to husband after PT Goal Formulation: With patient Time For Goal Achievement: 08/15/18 Potential to Achieve Goals: Fair Progress towards PT goals: Progressing toward goals    Frequency    Min 3X/week      PT Plan Discharge plan needs to be updated    Co-evaluation PT/OT/SLP Co-Evaluation/Treatment: Yes Reason for Co-Treatment: For patient/therapist safety;To address functional/ADL transfers PT goals addressed during session: Mobility/safety with mobility OT goals addressed during session: ADL's and self-care      AM-PAC PT "6 Clicks" Mobility   Outcome Measure  Help needed turning from your back to your side while in a flat bed without using bedrails?: A Little Help needed moving from lying on your back to sitting on the side of a flat bed without using bedrails?: A Lot Help needed moving to and from a bed to a chair (including a wheelchair)?: A Lot Help needed standing up from a chair using your arms (e.g., wheelchair or bedside chair)?: A Lot Help needed to walk in hospital room?: A Lot Help needed climbing 3-5 steps with a railing? : Total 6 Click Score: 12    End of Session Equipment Utilized During Treatment: Gait belt Activity Tolerance: Patient tolerated treatment well Patient left: in chair;with call bell/phone within reach;with chair alarm set Nurse Communication: Mobility status;Need for lift equipment PT Visit  Diagnosis: Muscle weakness (generalized) (M62.81);Other abnormalities of gait and mobility (R26.89)     Time: 1517-6160 PT Time Calculation (min) (ACUTE ONLY): 36 min  Charges:  $Therapeutic Activity: 8-22 mins                     Alexa Terrial Rhodes, PT Acute Rehabilitation Services Pager [phone]  Office [phone]    Alexa D Eure 08/13/2018, 1:01 PM

## 2018-08-13 NOTE — Progress Notes (Signed)
Occupational Therapy Treatment Patient Details Name: Natasha Frederick MRN: [id number] DOB: 1956-01-10 Today's Date: 08/13/2018    History of present illness Pt is a 63 year old female s/p lap Graham patch of perforated gastric ulcer on 07/30/18 by Dr. [name].  Had to be transferred from [facility] as she needed bilateral popliteal and tibial embolectomies as well as left fasciotomy on 4/29. S/p R BKA 5/1  PMHx significant for Huntington's disease, scoliosis, back surgery   OT comments  Pt participated well in OT/PT session.  Sat eob for adl tasks, bed level for toileting as we didn't have drop arm commode available.  Pt stood with max +2 but nonfunctional for transfer. Performed lateral scoot to chair.   Follow Up Recommendations  CIR    Equipment Recommendations  (drop arm commode)    Recommendations for Other Services      Precautions / Restrictions Precautions Precautions: Fall Precaution Comments:  L LE numbness and drop foot s/p fasciatomy  Restrictions RLE Weight Bearing: Non weight bearing       Mobility Bed Mobility Overal bed mobility: Needs Assistance Bed Mobility: Rolling;Supine to Sit;Sit to Supine Rolling: Min assist;+2 for safety/equipment   Supine to sit: Min assist Sit to supine: Min assist;HOB elevated   General bed mobility comments: Min assist for rolling for completion of roll, rolling towards L side with use of bed rails. Min assist for supine<>sit for trunk elevation and lowering, LE management, and scooting to and from EOB with use of bed pad.. Pt with decreased safety awareness when rolling:  would have hit residual limb on bedrail without guarding/assistance  Transfers Overall transfer level: Needs assistance Equipment used: 2 person hand held assist Transfers: Sit to/from Stand;Lateral/Scoot Transfers Sit to Stand: Max assist;+2 safety/equipment;+2 physical assistance;From elevated surface        Lateral/Scoot Transfers: Mod assist;Max assist;+2 physical  assistance General transfer comment: Sit to stand x1 with max assit +2 for LLE blocking, power up, and steadying upon standing. Pt very uncomfortable in standing due to decreased sensation of LLE and recent RLE amputation. Max assist +2 for scoot transfer to drop arm recliner for lifting, translation into chair, and posterior guiding assist. Pt required mod assist for scooting once in chair.     Balance Overall balance assessment: Needs assistance Sitting-balance support: No upper extremity supported;Feet supported Sitting balance-Leahy Scale: Fair Sitting balance - Comments: min guard-min assist for steadying and return to midline. Pt sat EOB 10 minutes performing ADLs  Postural control: Posterior lean   Standing balance-Leahy Scale: Zero Standing balance comment: stood with max assist +2, reliant on PT/OT and RW                           ADL either performed or assessed with clinical judgement   ADL       Grooming: Brushing hair;Sitting;Moderate assistance Grooming Details (indicate cue type and reason): decreased thoroughness         Upper Body Dressing : Moderate assistance;Sitting           Toileting- Clothing Manipulation and Hygiene: Total assistance;Bed level         General ADL Comments: min A for balance at EOB while performing grooming and UB dressing.  Stood briefly to assess for toilet transfer. Did not have a drop arm commode, so used bedpan then did lateral scoot transfer to chair     Vision       Perception     Praxis  Cognition Arousal/Alertness: Awake/alert Behavior During Therapy: WFL for tasks assessed/performed;Anxious Overall Cognitive Status: No family/caregiver present to determine baseline cognitive functioning Area of Impairment: Attention;Following commands                   Current Attention Level: Selective   Following Commands: Follows one step commands consistently Safety/Judgement: Decreased awareness of  safety     General Comments: Pt with anxiety about mobility this session, stating "I can't stand, I have nothing to stand on"        Exercises     Shoulder Instructions       General Comments blister on dorsum of L foot, intact     Pertinent Vitals/ Pain       Pain Assessment: Faces Faces Pain Scale: Hurts little more Pain Location: LLE after standing Pain Descriptors / Indicators: Sore Pain Intervention(s): Monitored during session;Limited activity within patient's tolerance  Home Living                                          Prior Functioning/Environment              Frequency  Min 2X/week        Progress Toward Goals  OT Goals(current goals can now be found in the care plan section)  Progress towards OT goals: Progressing toward goals  Acute Rehab OT Goals Patient Stated Goal: go to rehab and home to husband after  Plan      Co-evaluation    PT/OT/SLP Co-Evaluation/Treatment: Yes Reason for Co-Treatment: For patient/therapist safety PT goals addressed during session: Mobility/safety with mobility OT goals addressed during session: ADL's and self-care      AM-PAC OT "6 Clicks" Daily Activity     Outcome Measure   Help from another person eating meals?: A Little Help from another person taking care of personal grooming?: A Lot Help from another person toileting, which includes using toliet, bedpan, or urinal?: Total Help from another person bathing (including washing, rinsing, drying)?: A Lot Help from another person to put on and taking off regular upper body clothing?: A Lot Help from another person to put on and taking off regular lower body clothing?: Total 6 Click Score: 11    End of Session    OT Visit Diagnosis: Muscle weakness (generalized) (M62.81);Pain Pain - Right/Left: Right Pain - part of body: Leg   Activity Tolerance Patient tolerated treatment well   Patient Left in chair;with call bell/phone within  reach;with chair alarm set   Nurse Communication (lateral scoot for back to bed vs maximove)        Time: [id number] OT Time Calculation (min): 36 min  Charges: OT General Charges $OT Visit: 1 Visit OT Treatments $Self Care/Home Management : 8-22 mins  Marica OtterMaryellen Spencer, OTR/L Acute Rehabilitation Services [phone] [facility] pager [phone] office 08/13/2018   SPENCER,MARYELLEN 08/13/2018, 1:09 PM

## 2018-08-13 NOTE — Telephone Encounter (Signed)
-----   Message from Emilie Rutter, PA-C sent at 08/13/2018  7:53 AM EDT -----  Can you schedule an appt for this pt in 4 weeks for wound check and staple removal with Dr. Arbie Cookey. PO R BKA and LLE thrombectomy and fasciotomies. Thanks, [employer]

## 2018-08-15 ENCOUNTER — Telehealth (HOSPITAL_COMMUNITY): Payer: Self-pay

## 2018-08-15 DIAGNOSIS — K251 Acute gastric ulcer with perforation: Secondary | ICD-10-CM | POA: Diagnosis not present

## 2018-08-15 DIAGNOSIS — G10 Huntington's disease: Secondary | ICD-10-CM | POA: Diagnosis not present

## 2018-08-15 DIAGNOSIS — I998 Other disorder of circulatory system: Secondary | ICD-10-CM | POA: Diagnosis not present

## 2018-08-15 DIAGNOSIS — T8143XA Infection following a procedure, organ and space surgical site, initial encounter: Secondary | ICD-10-CM | POA: Diagnosis not present

## 2018-08-15 NOTE — Telephone Encounter (Signed)
Follow up phone call with husband s/p discharge from hospital. States patient admitted to [facility] and she seems to be doing well. Husband reports pt evaluated for rehab and is to begin therapy today. He reports staff says wounds are healing without complication. Anticipates that she will be at SNF "for a while" to recover and increase her strength. Scheduled for a follow up visit with Dr Arbie Cookey 09/09/2018.  Denies any questions or barriers at this time. Left husband PV Navigator contact information should questions or concerns arise.   Hilma Favors RN BSN CWS PV Navigator [place]

## 2018-08-20 DIAGNOSIS — I7410 Embolism and thrombosis of unspecified parts of aorta: Secondary | ICD-10-CM | POA: Diagnosis not present

## 2018-08-20 DIAGNOSIS — K251 Acute gastric ulcer with perforation: Secondary | ICD-10-CM | POA: Diagnosis not present

## 2018-08-20 DIAGNOSIS — G894 Chronic pain syndrome: Secondary | ICD-10-CM | POA: Diagnosis not present

## 2018-08-20 DIAGNOSIS — D735 Infarction of spleen: Secondary | ICD-10-CM | POA: Diagnosis not present

## 2018-08-29 DIAGNOSIS — K251 Acute gastric ulcer with perforation: Secondary | ICD-10-CM | POA: Diagnosis not present

## 2018-08-29 DIAGNOSIS — G10 Huntington's disease: Secondary | ICD-10-CM | POA: Diagnosis not present

## 2018-08-29 DIAGNOSIS — T8143XA Infection following a procedure, organ and space surgical site, initial encounter: Secondary | ICD-10-CM | POA: Diagnosis not present

## 2018-08-29 DIAGNOSIS — I7410 Embolism and thrombosis of unspecified parts of aorta: Secondary | ICD-10-CM | POA: Diagnosis not present

## 2018-09-02 ENCOUNTER — Telehealth (HOSPITAL_COMMUNITY): Payer: Self-pay

## 2018-09-02 NOTE — Telephone Encounter (Signed)
Follow up telephone call with patient's husband status post pt discharge to [facility] from hospitalization. Husband reports that patient is doing well with rehab and has an appointment with [facility] 09/05/2018 follow up gastric surgery 07/30/2018 and a follow up with [facility] Dr Arbie Cookey on 09/09/2018 for her R BKA. Denies any questions or concerns. Husband has and was able to repeat back PV navigator contact information should questions or barriers arise.   Hilma Favors RN BSN CWS PV Navigator [employer] [phone]

## 2018-09-04 ENCOUNTER — Emergency Department (HOSPITAL_COMMUNITY): Payer: Medicare Other

## 2018-09-04 ENCOUNTER — Emergency Department (HOSPITAL_COMMUNITY)
Admission: EM | Admit: 2018-09-04 | Discharge: 2018-09-05 | Disposition: A | Discharge status: Skilled Nursing Facility | Payer: Medicare Other | Attending: Emergency Medicine | Admitting: Emergency Medicine

## 2018-09-04 ENCOUNTER — Other Ambulatory Visit: Payer: Self-pay

## 2018-09-04 DIAGNOSIS — S0101XA Laceration without foreign body of scalp, initial encounter: Secondary | ICD-10-CM | POA: Diagnosis not present

## 2018-09-04 DIAGNOSIS — Y999 Unspecified external cause status: Secondary | ICD-10-CM | POA: Diagnosis not present

## 2018-09-04 DIAGNOSIS — R51 Headache: Secondary | ICD-10-CM | POA: Diagnosis not present

## 2018-09-04 DIAGNOSIS — W06XXXA Fall from bed, initial encounter: Secondary | ICD-10-CM | POA: Insufficient documentation

## 2018-09-04 DIAGNOSIS — Y92199 Unspecified place in other specified residential institution as the place of occurrence of the external cause: Secondary | ICD-10-CM | POA: Diagnosis not present

## 2018-09-04 DIAGNOSIS — Z96642 Presence of left artificial hip joint: Secondary | ICD-10-CM | POA: Insufficient documentation

## 2018-09-04 DIAGNOSIS — Z87891 Personal history of nicotine dependence: Secondary | ICD-10-CM | POA: Insufficient documentation

## 2018-09-04 DIAGNOSIS — Y939 Activity, unspecified: Secondary | ICD-10-CM | POA: Insufficient documentation

## 2018-09-04 DIAGNOSIS — S0990XA Unspecified injury of head, initial encounter: Secondary | ICD-10-CM | POA: Diagnosis present

## 2018-09-04 DIAGNOSIS — Z79899 Other long term (current) drug therapy: Secondary | ICD-10-CM | POA: Insufficient documentation

## 2018-09-04 DIAGNOSIS — W19XXXA Unspecified fall, initial encounter: Secondary | ICD-10-CM

## 2018-09-04 MED ORDER — LIDOCAINE-EPINEPHRINE 2 %-1:100000 IJ SOLN
10.0000 mL | Freq: Once | INTRAMUSCULAR | Status: AC
  Administered 2018-09-05: 10 mL via INTRADERMAL
  Filled 2018-09-04: qty 1

## 2018-09-04 NOTE — ED Triage Notes (Signed)
Pt has hx of Huntington's Disease and had a witnessed mechanical fall out of bed secondary to tremors. Denies LOC and thinners. Pt hit her head on the floor and has a 1 inch laceration on the back of her head. Pt denies pain.

## 2018-09-04 NOTE — ED Notes (Signed)
Bed: WA21 Expected date:  Expected time:  Means of arrival:  Comments: 

## 2018-09-05 ENCOUNTER — Emergency Department (HOSPITAL_COMMUNITY): Payer: Medicare Other

## 2018-09-05 DIAGNOSIS — S0101XA Laceration without foreign body of scalp, initial encounter: Secondary | ICD-10-CM | POA: Diagnosis not present

## 2018-09-05 DIAGNOSIS — I7410 Embolism and thrombosis of unspecified parts of aorta: Secondary | ICD-10-CM | POA: Diagnosis not present

## 2018-09-05 DIAGNOSIS — R296 Repeated falls: Secondary | ICD-10-CM | POA: Diagnosis not present

## 2018-09-05 DIAGNOSIS — K251 Acute gastric ulcer with perforation: Secondary | ICD-10-CM | POA: Diagnosis not present

## 2018-09-05 DIAGNOSIS — G10 Huntington's disease: Secondary | ICD-10-CM | POA: Diagnosis not present

## 2018-09-05 MED ORDER — OXYCODONE-ACETAMINOPHEN 5-325 MG PO TABS
1.0000 | ORAL_TABLET | Freq: Once | ORAL | Status: AC
  Administered 2018-09-05: 1 via ORAL
  Filled 2018-09-05: qty 1

## 2018-09-05 NOTE — Discharge Instructions (Addendum)
Please follow up in 5-7 days with PCP, facility provider, or ED to have staples removed from scalp.

## 2018-09-05 NOTE — ED Notes (Signed)
Patient transported to CT 

## 2018-09-05 NOTE — ED Notes (Signed)
[facility] contacted for transport. Paperwork printed and at bedside.  

## 2018-09-05 NOTE — ED Provider Notes (Signed)
[facility]-EMERGENCY DEPT Provider Note  CSN: [id number] Arrival date & time: 09/04/18 2215  Chief Complaint(s) Fall  HPI Natasha Frederick is a 63 y.o. female with history of peripheral vascular disease, Huntington's disease who is currently staying in a rehab facility following right BKA who presents to the emergency department after falling from bed.  Patient reports patient sleeping and woke up as she was falling from bed. She sustained occipital head trauma and laceration.  She denied loss of consciousness.  Patient is not anticoagulated.  Worsening mild scalp pain without headache.  Denies any associated symptoms. Tetanus UTD.  HPI  Past Medical History Past Medical History:  Diagnosis Date  . Arthritis    hip  . Endometriosis   . Scoliosis    Patient Active Problem List   Diagnosis Date Noted  . Cold right foot 08/06/2018  . Gastric ulcer 08/06/2018  . Delirium 08/01/2018  . Perforated gastric ulcer s/p omental patch 07/30/2018 07/31/2018  . Bile peritonitis (HCC) 07/31/2018  . Pneumoperitoneum 07/30/2018  . Long term current use of non-steroidal anti-inflammatories (NSAID) 07/30/2018  . ARF (acute renal failure) (HCC) 07/30/2018  . 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  . Primary osteoarthritis of left hip 07/19/2014  . Scoliosis of lumbar spine 01/05/2014   Home Medication(s) Prior to Admission medications   Medication Sig Start Date End Date Taking? Authorizing Provider  Deutetrabenazine (AUSTEDO) 12 MG TABS Take 24 mg by mouth 2 (two) times daily. 02/27/18   Tat, Octaviano Batty, DO  methocarbamol (ROBAXIN) 500 MG tablet Take 1 tablet (500 mg total) by mouth 3 (three) times daily. 08/13/18   Maczis, Elmer Sow, PA-C  Multiple Vitamin (MULTIVITAMIN WITH MINERALS) TABS tablet Take 1 tablet by mouth daily. 08/14/18   Maczis, Elmer Sow, PA-C  oxyCODONE-acetaminophen (PERCOCET/ROXICET) 5-325 MG tablet Take 1 tablet by mouth every 6 (six) hours as needed. 08/13/18    Maczis, Elmer Sow, PA-C  pantoprazole (PROTONIX) 40 MG tablet Take 1 tablet (40 mg total) by mouth 2 (two) times daily. 08/13/18   Maczis, Elmer Sow, PA-C  polyethylene glycol (MIRALAX / GLYCOLAX) 17 g packet Take 17 g by mouth daily. 08/14/18   Maczis, Elmer Sow, PA-C                                                                                                                                    Past Surgical History Past Surgical History:  Procedure Laterality Date  . AMPUTATION Right 08/08/2018   Procedure: AMPUTATION BELOW KNEE RIGHT LOWER EXTREMITY;  Surgeon: Larina Earthly, MD;  Location: [facility] OR;  Service: Vascular;  Laterality: Right;  . ANTERIOR LAT LUMBAR FUSION Left 01/05/2014   Procedure: LUMBAR TWO TO THREE, LUMBAR LUMBAR THREE TO FOUR, ANTERIOR LATERAL LUMBAR FUSION 2 LEVELS;  Surgeon: Reinaldo Meeker, MD;  Location: [facility] NEURO ORS;  Service: Neurosurgery;  Laterality: Left;  . APPENDECTOMY  1963  . BACK SURGERY    .  BREAST SURGERY Left 1987   bx  . COLONOSCOPY W/ POLYPECTOMY    . EYE SURGERY    . FEMORAL-POPLITEAL BYPASS GRAFT Bilateral 08/06/2018   Procedure: BILATERAL POPLITEAL AND TIBIAL EMBOLECTOMIES, LEFT LEG FASCIOTOMY;  Surgeon: Larina Earthly, MD;  Location: [facility] OR;  Service: Vascular;  Laterality: Bilateral;  . LAPAROSCOPY N/A 07/30/2018   Procedure: DIAGNOSTIC LAPAROSCOPY, Omentopexy gram patch, Washout of intra-abdominal abcesses x 3;  Surgeon: Karie Soda, MD;  Location: [facility] ORS;  Service: General;  Laterality: N/A;  . OOPHORECTOMY  1977   left  . Planters wart Left 2005  . radial keratoto Bilateral   . TONSILLECTOMY  1958  . TOTAL HIP ARTHROPLASTY Left 07/19/2014   Procedure: TOTAL HIP ARTHROPLASTY;  Surgeon: Gean Birchwood, MD;  Location: [facility] OR;  Service: Orthopedics;  Laterality: Left;  . TUBAL LIGATION  1986   Family History Family History  Problem Relation Age of Onset  . Lung cancer Mother   . Stroke Father   . Huntington's disease Paternal Grandmother   .  Huntington's disease Paternal Aunt   . Huntington's disease Paternal Uncle   . Arthritis Neg Hx        family hx  . Cancer Neg Hx        prostate ca -family hx  . Heart disease Neg Hx        family hx    Social History Social History   Tobacco Use  . Smoking status: Former Smoker    Packs/day: 1.00    Years: 8.00    Pack years: 8.00    Types: Cigarettes    Last attempt to quit: 01/17/1994    Years since quitting: 24.6  . Smokeless tobacco: Never Used  Substance Use Topics  . Alcohol use: Yes    Alcohol/week: 3.0 standard drinks    Types: 3 Cans of beer per week    Comment: per week  . Drug use: No   Allergies Bee venom; Nsaids; Adhesive [tape]; Codeine; Penicillins; and Sulfa antibiotics  Review of Systems Review of Systems  Gastrointestinal: Positive for diarrhea (currently being treated for C.diff).   All other systems are reviewed and are negative for acute change except as noted in the HPI  Physical Exam Vital Signs  I have reviewed the triage vital signs BP (!) 150/113 (BP Location: Right Arm)   Pulse 89   Temp 98.4 F (36.9 C) (Oral)   Resp 16   Ht  (1.626 m)   Wt 59 kg   SpO2 99%   BMI 22.31 kg/m  All other systems are reviewed and are negative for acute change except as noted in the HPI  Physical Exam Vitals signs reviewed.  Constitutional:      General: She is not in acute distress.    Appearance: She is well-developed. She is not diaphoretic.  HENT:     Head: Normocephalic. Laceration present.      Right Ear: External ear normal.     Left Ear: External ear normal.     Nose: Nose normal.  Eyes:     General: No scleral icterus.    Conjunctiva/sclera: Conjunctivae normal.  Neck:     Musculoskeletal: Normal range of motion. No spinous process tenderness or muscular tenderness.     Trachea: Phonation normal.  Cardiovascular:     Rate and Rhythm: Normal rate and regular rhythm.  Pulmonary:     Effort: Pulmonary effort is normal. No  respiratory distress.     Breath sounds:  No stridor.  Abdominal:     General: There is no distension.  Musculoskeletal: Normal range of motion.       Legs:  Neurological:     Mental Status: She is alert and oriented to person, place, and time.     Comments: chorea  Psychiatric:        Behavior: Behavior normal.     ED Results and Treatments Labs (all labs ordered are listed, but only abnormal results are displayed) Labs Reviewed - No data to display                                                                                                                       EKG  EKG Interpretation  Date/Time:    Ventricular Rate:    PR Interval:    QRS Duration:   QT Interval:    QTC Calculation:   R Axis:     Text Interpretation:        Radiology Ct Head Wo Contrast  Result Date: 09/05/2018 CLINICAL DATA:  Recent fall with headaches, initial encounter EXAM: CT HEAD WITHOUT CONTRAST TECHNIQUE: Contiguous axial images were obtained from the base of the skull through the vertex without intravenous contrast. COMPARISON:  None. FINDINGS: Brain: Atrophic changes and chronic white matter ischemic changes are noted. Changes of prior infarct in the right frontal lobe with encephalomalacia is seen. No acute hemorrhage, acute infarction or space-occupying mass lesion is noted. Vascular: No hyperdense vessel or unexpected calcification. Skull: Normal. Negative for fracture or focal lesion. Sinuses/Orbits: No acute finding. Other: None. IMPRESSION: Atrophic changes and chronic white matter ischemic change. Prior right frontal lobe infarct. No acute abnormality noted. Electronically Signed   By: Alcide CleverMark  Lukens M.D.   On: 09/05/2018 01:31   Pertinent labs & imaging results that were available during my care of the patient were reviewed by me and considered in my medical decision making (see chart for details).  Medications Ordered in ED Medications  lidocaine-EPINEPHrine (XYLOCAINE W/EPI) 2  %-1:100000 (with pres) injection 10 mL (has no administration in time range)                                                                                                                                    Procedures .Marland Kitchen.Laceration Repair Date/Time: 09/05/2018 12:13 AM Performed by: Nira Connardama, Pedro Eduardo, MD Authorized by: Nira Connardama, Pedro Eduardo, MD   Consent:    Consent obtained:  Verbal   Consent given by:  Patient   Risks discussed:  Need for additional repair, poor cosmetic result, pain and poor wound healing   Alternatives discussed:  No treatment Anesthesia (see MAR for exact dosages):    Anesthesia method:  Local infiltration   Local anesthetic:  Lidocaine 2% WITH epi Laceration details:    Location:  Scalp   Scalp location:  Occipital   Length (cm):  2.7   Depth (mm):  3 Repair type:    Repair type:  Simple Pre-procedure details:    Preparation:  Patient was prepped and draped in usual sterile fashion and imaging obtained to evaluate for foreign bodies Exploration:    Wound exploration: wound explored through full range of motion and entire depth of wound probed and visualized     Wound extent: no foreign bodies/material noted, no muscle damage noted and no vascular damage noted     Contaminated: no   Treatment:    Area cleansed with:  Betadine   Amount of cleaning:  Standard   Irrigation solution:  Sterile saline   Irrigation volume:  500cc   Irrigation method:  Pressure wash Skin repair:    Repair method:  Staples   Number of staples:  3 Approximation:    Approximation:  Close Post-procedure details:    Dressing:  Antibiotic ointment   Patient tolerance of procedure:  Tolerated well, no immediate complications    (including critical care time)  Medical Decision Making / ED Course I have reviewed the nursing notes for this encounter and the patient's prior records (if available in EHR or on provided paperwork).    CT head NEGATIVE. Laceration irrigated and  closed as above. Follow up for stable removal.  The patient appears reasonably screened and/or stabilized for discharge and I doubt any other medical condition or other [facility] requiring further screening, evaluation, or treatment in the ED at this time prior to discharge.  The patient is safe for discharge with strict return precautions.   Final Clinical Impression(s) / ED Diagnoses Final diagnoses:  Fall, initial encounter  Laceration of scalp, initial encounter    Disposition: Discharge  Condition: Good  I have discussed the results, Dx and Tx plan with the patient who expressed understanding and agree(s) with the plan. Discharge instructions discussed at great length. The patient was given strict return precautions who verbalized understanding of the instructions. No further questions at time of discharge.    ED Discharge Orders    None        This chart was dictated using voice recognition software.  Despite best efforts to proofread,  errors can occur which can change the documentation meaning.   Nira Conn, MD 09/05/18 [phone]

## 2018-09-05 NOTE — ED Notes (Signed)
Suture tray and lidocaine at bedside. Wound irrigated.

## 2018-09-09 ENCOUNTER — Encounter: Payer: Federal, State, Local not specified - PPO | Admitting: Vascular Surgery

## 2018-09-09 DIAGNOSIS — T8143XA Infection following a procedure, organ and space surgical site, initial encounter: Secondary | ICD-10-CM | POA: Diagnosis not present

## 2018-09-09 DIAGNOSIS — K251 Acute gastric ulcer with perforation: Secondary | ICD-10-CM | POA: Diagnosis not present

## 2018-09-09 DIAGNOSIS — R451 Restlessness and agitation: Secondary | ICD-10-CM | POA: Diagnosis not present

## 2018-09-09 DIAGNOSIS — F331 Major depressive disorder, recurrent, moderate: Secondary | ICD-10-CM | POA: Diagnosis not present

## 2018-09-16 ENCOUNTER — Other Ambulatory Visit: Payer: Self-pay

## 2018-09-16 ENCOUNTER — Ambulatory Visit (INDEPENDENT_AMBULATORY_CARE_PROVIDER_SITE_OTHER): Payer: Self-pay | Admitting: Vascular Surgery

## 2018-09-16 ENCOUNTER — Telehealth: Payer: Self-pay | Admitting: Family Medicine

## 2018-09-16 ENCOUNTER — Encounter: Payer: Self-pay | Admitting: Vascular Surgery

## 2018-09-16 VITALS — BP 112/68 | HR 90 | Temp 97.7°F | Resp 20 | Ht 64.0 in | Wt 130.0 lb

## 2018-09-16 DIAGNOSIS — F331 Major depressive disorder, recurrent, moderate: Secondary | ICD-10-CM | POA: Diagnosis not present

## 2018-09-16 DIAGNOSIS — R296 Repeated falls: Secondary | ICD-10-CM | POA: Diagnosis not present

## 2018-09-16 DIAGNOSIS — R451 Restlessness and agitation: Secondary | ICD-10-CM | POA: Diagnosis not present

## 2018-09-16 DIAGNOSIS — I739 Peripheral vascular disease, unspecified: Secondary | ICD-10-CM

## 2018-09-16 DIAGNOSIS — F419 Anxiety disorder, unspecified: Secondary | ICD-10-CM | POA: Diagnosis not present

## 2018-09-16 DIAGNOSIS — Z89511 Acquired absence of right leg below knee: Secondary | ICD-10-CM

## 2018-09-16 NOTE — Progress Notes (Signed)
Patient name: Natasha BeckmannJudith Frederick MRN: [id number] DOB: 04/03/1956 Sex: female  REASON FOR VISIT: Follow-up recent hospitalization  HPI: Natasha Frederick is a 63 y.o. female today for follow-up.  She had a very complex hospitalization 4 to 6 weeks ago.  She had an unusual presentation with leg pain and was also found to have a perforated gastric ulcer which was repaired.  In her recovery she was noted to have critical limb ischemia bilaterally and was transferred [facility].  She went attempted salvage of both lower extremities.  She had a prolonged ischemic time in her right foot had dry gangrene on presentation and was not viable.  I was able to get flow to her left foot but she had a profound foot drop due to the prolonged ischemia time.  She had medial and lateral fasciotomies as well.  She underwent right below-knee amputation on 08/08/2018.  The muscle was very questionably viable at the time of surgery but she did not have any healing difficulty while in hospital.  She and her husband opted for skilled nursing facility rather than consideration for inpatient rehab.  She was discharged to skilled nursing.  At the time of discharge she continues to have complete foot drop on the left.  She is here today and I have discussed with her husband by telephone for in excess of 25 minutes her current state and plans.  She is tearful and wanting to leave the nursing facility but her husband is realistic regarding abilities to care for her at home.  Current Outpatient Medications  Medication Sig Dispense Refill  . ALPRAZolam (XANAX) 0.5 MG tablet Take 0.5 mg by mouth 3 (three) times daily.    . busPIRone (BUSPAR) 5 MG tablet Take 5 mg by mouth 3 (three) times daily.    . Deutetrabenazine (AUSTEDO) 12 MG TABS Take 24 mg by mouth 2 (two) times daily. 360 tablet 1  . lidocaine (LIDODERM) 5 % Place 1 patch onto the skin daily. Remove & Discard patch within 12 hours or as directed by  MD    . methocarbamol (ROBAXIN) 500 MG tablet Take 1 tablet (500 mg total) by mouth 3 (three) times daily. 30 tablet 0  . Multiple Vitamin (MULTIVITAMIN WITH MINERALS) TABS tablet Take 1 tablet by mouth daily. 30 tablet 0  . oxyCODONE-acetaminophen (PERCOCET/ROXICET) 5-325 MG tablet Take 1 tablet by mouth every 6 (six) hours as needed. 15 tablet 0  . pantoprazole (PROTONIX) 40 MG tablet Take 1 tablet (40 mg total) by mouth 2 (two) times daily. 60 tablet 1  . polyethylene glycol (MIRALAX / GLYCOLAX) 17 g packet Take 17 g by mouth daily. 14 each 0   No current facility-administered medications for this visit.      PHYSICAL EXAM: Vitals:   09/16/18 1349  BP: 112/68  Pulse: 90  Resp: 20  Temp: 97.7 F (36.5 C)  SpO2: 96%  Weight: 130 lb (59 kg)  Height: 5\' 4"  (1.626 m)    GENERAL: The patient is a well-nourished female, in no acute distress. The vital signs are documented above. Right above-knee amputation completely healed with no evidence of breakdown. Medial and lateral fasciotomy is healed.  2+ dorsalis pedis and posterior tibial pulse on the left  MEDICAL ISSUES: No further ongoing vascular issues.  The main issue is recovery and rehabilitation from her foot drop.  She reports that [facility] following [facility] surgery and was quite pleased with them.  We  will refer her for evaluation regarding her right BKA and her left foot drop.  Also husband is questioning options for getting her home and home needs.  Will refer her to home health for assessment as well.  Her staples were removed today and she will see [place] again on an as-needed basis   Rosetta Posner, MD [facility] Tel [phone] Pager [phone]

## 2018-09-16 NOTE — Telephone Encounter (Signed)
[place] Practitioner Plan of Care form to be filled out;placed in dr's folder.  Call [phone] upon completion

## 2018-09-17 ENCOUNTER — Other Ambulatory Visit: Payer: Self-pay | Admitting: Vascular Surgery

## 2018-09-17 DIAGNOSIS — I739 Peripheral vascular disease, unspecified: Secondary | ICD-10-CM

## 2018-09-17 DIAGNOSIS — Z89511 Acquired absence of right leg below knee: Secondary | ICD-10-CM

## 2018-09-17 DIAGNOSIS — M21372 Foot drop, left foot: Secondary | ICD-10-CM

## 2018-09-17 NOTE — Telephone Encounter (Signed)
Please let me know when form is completed

## 2018-09-17 NOTE — Telephone Encounter (Signed)
Form completed. RN called and LVM that forms are completed and up front for pick up.

## 2018-09-17 NOTE — Telephone Encounter (Signed)
I have not seen any forms yet.   Are they up front?? 

## 2018-09-18 DIAGNOSIS — F331 Major depressive disorder, recurrent, moderate: Secondary | ICD-10-CM | POA: Diagnosis not present

## 2018-09-18 DIAGNOSIS — K251 Acute gastric ulcer with perforation: Secondary | ICD-10-CM | POA: Diagnosis not present

## 2018-09-18 DIAGNOSIS — R451 Restlessness and agitation: Secondary | ICD-10-CM | POA: Diagnosis not present

## 2018-09-18 DIAGNOSIS — R296 Repeated falls: Secondary | ICD-10-CM | POA: Diagnosis not present

## 2018-09-19 ENCOUNTER — Telehealth: Payer: Self-pay | Admitting: Vascular Surgery

## 2018-09-19 NOTE — Telephone Encounter (Addendum)
Patient's husband Shekinah Pitones called.  Says he spoke to Therapist Stanton Kidney at [facility] and She doesn't think patient is ready to stop Therapy.  Husband says patient has Huntington's Disease and is terminal.  When she does come home he will need assistance.  I sent a referral for Home Health to [place]. At [facility].  She is working on helping this patient.  Husband verbalized understanding.  Thurston Hole., LPN

## 2018-09-26 DIAGNOSIS — F321 Major depressive disorder, single episode, moderate: Secondary | ICD-10-CM | POA: Diagnosis not present

## 2018-09-26 DIAGNOSIS — G10 Huntington's disease: Secondary | ICD-10-CM | POA: Diagnosis not present

## 2018-09-29 DIAGNOSIS — R296 Repeated falls: Secondary | ICD-10-CM | POA: Diagnosis not present

## 2018-09-29 DIAGNOSIS — F419 Anxiety disorder, unspecified: Secondary | ICD-10-CM | POA: Diagnosis not present

## 2018-09-29 DIAGNOSIS — K5900 Constipation, unspecified: Secondary | ICD-10-CM | POA: Diagnosis not present

## 2018-09-29 DIAGNOSIS — G10 Huntington's disease: Secondary | ICD-10-CM | POA: Diagnosis not present

## 2018-10-02 DIAGNOSIS — G10 Huntington's disease: Secondary | ICD-10-CM | POA: Diagnosis not present

## 2018-10-02 DIAGNOSIS — F321 Major depressive disorder, single episode, moderate: Secondary | ICD-10-CM | POA: Diagnosis not present

## 2018-10-08 DIAGNOSIS — K5900 Constipation, unspecified: Secondary | ICD-10-CM | POA: Diagnosis not present

## 2018-10-08 DIAGNOSIS — R296 Repeated falls: Secondary | ICD-10-CM | POA: Diagnosis not present

## 2018-10-08 DIAGNOSIS — T8143XA Infection following a procedure, organ and space surgical site, initial encounter: Secondary | ICD-10-CM | POA: Diagnosis not present

## 2018-10-08 DIAGNOSIS — K251 Acute gastric ulcer with perforation: Secondary | ICD-10-CM | POA: Diagnosis not present

## 2018-10-15 DIAGNOSIS — G10 Huntington's disease: Secondary | ICD-10-CM | POA: Diagnosis not present

## 2018-10-15 DIAGNOSIS — F321 Major depressive disorder, single episode, moderate: Secondary | ICD-10-CM | POA: Diagnosis not present

## 2018-10-16 DIAGNOSIS — R296 Repeated falls: Secondary | ICD-10-CM | POA: Diagnosis not present

## 2018-10-16 DIAGNOSIS — G10 Huntington's disease: Secondary | ICD-10-CM | POA: Diagnosis not present

## 2018-10-16 DIAGNOSIS — K251 Acute gastric ulcer with perforation: Secondary | ICD-10-CM | POA: Diagnosis not present

## 2018-10-16 DIAGNOSIS — K5900 Constipation, unspecified: Secondary | ICD-10-CM | POA: Diagnosis not present

## 2018-10-21 DIAGNOSIS — G10 Huntington's disease: Secondary | ICD-10-CM | POA: Diagnosis not present

## 2018-10-21 DIAGNOSIS — R5381 Other malaise: Secondary | ICD-10-CM | POA: Diagnosis not present

## 2018-10-21 DIAGNOSIS — K5900 Constipation, unspecified: Secondary | ICD-10-CM | POA: Diagnosis not present

## 2018-10-21 DIAGNOSIS — K251 Acute gastric ulcer with perforation: Secondary | ICD-10-CM | POA: Diagnosis not present

## 2018-10-22 ENCOUNTER — Ambulatory Visit: Payer: Federal, State, Local not specified - PPO | Admitting: Gastroenterology

## 2018-10-23 ENCOUNTER — Telehealth: Payer: Self-pay | Admitting: Family Medicine

## 2018-10-23 NOTE — Telephone Encounter (Signed)
pls have Dr Erick Blinks nurse call Mr Natasha Frederick ASAP. His wife was just released today out of rehab form Abdominal Surgery and they are home and he has realize that they sent her home with no pain meds so he is quite concerned. She was on percocet at the facility. Could you call her in something today at  [facility], [place] - [address] [place] [address] [place] [address] [place] [address] [phone] (Phone) [phone] (Fax)   If not please call him at [phone] and advise him what to do.

## 2018-10-24 ENCOUNTER — Telehealth: Payer: Self-pay | Admitting: Family Medicine

## 2018-10-24 NOTE — Telephone Encounter (Signed)
Caller name: [place]  Relation to pt: RN [facility]  Call back number: [phone]   Reason for call:  Patient denied start of care today and would like start nurse care for 10/25/2018

## 2018-10-24 NOTE — Telephone Encounter (Signed)
Thanks

## 2018-10-24 NOTE — Telephone Encounter (Signed)
Spoke with husband. He states facility did send in meds for pt and he is going to pick them up this morning. He states the pt is doing well. They were advised to schedule a follow up in 2 weeks. This has been done.

## 2018-10-24 NOTE — Telephone Encounter (Signed)
We need a telephone or Doxy follow up.  We don't even know what dose she is on.    Need more information.

## 2018-10-27 NOTE — Telephone Encounter (Signed)
OK 

## 2018-10-27 NOTE — Telephone Encounter (Signed)
Please advise if OK for delay in care.

## 2018-10-28 DIAGNOSIS — Z791 Long term (current) use of non-steroidal anti-inflammatories (NSAID): Secondary | ICD-10-CM | POA: Diagnosis not present

## 2018-10-28 DIAGNOSIS — G10 Huntington's disease: Secondary | ICD-10-CM | POA: Diagnosis not present

## 2018-10-28 DIAGNOSIS — M21372 Foot drop, left foot: Secondary | ICD-10-CM | POA: Diagnosis not present

## 2018-10-28 DIAGNOSIS — K255 Chronic or unspecified gastric ulcer with perforation: Secondary | ICD-10-CM | POA: Diagnosis not present

## 2018-10-28 DIAGNOSIS — M419 Scoliosis, unspecified: Secondary | ICD-10-CM | POA: Diagnosis not present

## 2018-10-28 DIAGNOSIS — Z4781 Encounter for orthopedic aftercare following surgical amputation: Secondary | ICD-10-CM | POA: Diagnosis not present

## 2018-10-28 DIAGNOSIS — K668 Other specified disorders of peritoneum: Secondary | ICD-10-CM | POA: Diagnosis not present

## 2018-10-28 DIAGNOSIS — Z89511 Acquired absence of right leg below knee: Secondary | ICD-10-CM | POA: Diagnosis not present

## 2018-10-28 DIAGNOSIS — I70201 Unspecified atherosclerosis of native arteries of extremities, right leg: Secondary | ICD-10-CM | POA: Diagnosis not present

## 2018-10-28 DIAGNOSIS — M1612 Unilateral primary osteoarthritis, left hip: Secondary | ICD-10-CM | POA: Diagnosis not present

## 2018-10-28 NOTE — Telephone Encounter (Signed)
Called and gave the verbal OK per Dr. Elease Hashimoto. Tranance verbalized an understanding.

## 2018-10-29 DIAGNOSIS — M21372 Foot drop, left foot: Secondary | ICD-10-CM | POA: Diagnosis not present

## 2018-10-29 DIAGNOSIS — Z4781 Encounter for orthopedic aftercare following surgical amputation: Secondary | ICD-10-CM | POA: Diagnosis not present

## 2018-10-29 DIAGNOSIS — K668 Other specified disorders of peritoneum: Secondary | ICD-10-CM | POA: Diagnosis not present

## 2018-10-29 DIAGNOSIS — M1612 Unilateral primary osteoarthritis, left hip: Secondary | ICD-10-CM | POA: Diagnosis not present

## 2018-10-29 DIAGNOSIS — K255 Chronic or unspecified gastric ulcer with perforation: Secondary | ICD-10-CM | POA: Diagnosis not present

## 2018-10-29 DIAGNOSIS — Z89511 Acquired absence of right leg below knee: Secondary | ICD-10-CM | POA: Diagnosis not present

## 2018-10-30 DIAGNOSIS — K255 Chronic or unspecified gastric ulcer with perforation: Secondary | ICD-10-CM | POA: Diagnosis not present

## 2018-10-30 DIAGNOSIS — K668 Other specified disorders of peritoneum: Secondary | ICD-10-CM | POA: Diagnosis not present

## 2018-10-30 DIAGNOSIS — Z4781 Encounter for orthopedic aftercare following surgical amputation: Secondary | ICD-10-CM | POA: Diagnosis not present

## 2018-10-30 DIAGNOSIS — M21372 Foot drop, left foot: Secondary | ICD-10-CM | POA: Diagnosis not present

## 2018-10-30 DIAGNOSIS — M1612 Unilateral primary osteoarthritis, left hip: Secondary | ICD-10-CM | POA: Diagnosis not present

## 2018-10-30 DIAGNOSIS — Z89511 Acquired absence of right leg below knee: Secondary | ICD-10-CM | POA: Diagnosis not present

## 2018-11-03 DIAGNOSIS — M21372 Foot drop, left foot: Secondary | ICD-10-CM | POA: Diagnosis not present

## 2018-11-03 DIAGNOSIS — H6123 Impacted cerumen, bilateral: Secondary | ICD-10-CM | POA: Diagnosis not present

## 2018-11-03 DIAGNOSIS — M1612 Unilateral primary osteoarthritis, left hip: Secondary | ICD-10-CM | POA: Diagnosis not present

## 2018-11-03 DIAGNOSIS — Z4781 Encounter for orthopedic aftercare following surgical amputation: Secondary | ICD-10-CM | POA: Diagnosis not present

## 2018-11-03 DIAGNOSIS — K668 Other specified disorders of peritoneum: Secondary | ICD-10-CM | POA: Diagnosis not present

## 2018-11-03 DIAGNOSIS — Z89511 Acquired absence of right leg below knee: Secondary | ICD-10-CM | POA: Diagnosis not present

## 2018-11-03 DIAGNOSIS — K255 Chronic or unspecified gastric ulcer with perforation: Secondary | ICD-10-CM | POA: Diagnosis not present

## 2018-11-03 NOTE — Telephone Encounter (Signed)
Millie,Social Worker with [facility] went out to pt's home for eval. Today and states that pt's anxiety is high. She said that she feels that it would be beneficial for pt to be prescribed something to help?    CB: [phone]

## 2018-11-03 NOTE — Telephone Encounter (Signed)
Please see message. °

## 2018-11-03 NOTE — Telephone Encounter (Signed)
Called Millie with [facility] and gave her the message from Dr. Elease Hashimoto and she stated that she will reach out to Lubertha Basque to set up an appointment for the patient. They will call back to schedule the Doxy virtual visit.  CRM Created.

## 2018-11-03 NOTE — Telephone Encounter (Signed)
We will need to set up Doxy or phone call to discuss options for anxiety management

## 2018-11-05 ENCOUNTER — Ambulatory Visit (INDEPENDENT_AMBULATORY_CARE_PROVIDER_SITE_OTHER): Payer: Medicare Other | Admitting: Family Medicine

## 2018-11-05 ENCOUNTER — Other Ambulatory Visit: Payer: Self-pay

## 2018-11-05 DIAGNOSIS — F329 Major depressive disorder, single episode, unspecified: Secondary | ICD-10-CM | POA: Diagnosis not present

## 2018-11-05 DIAGNOSIS — G8929 Other chronic pain: Secondary | ICD-10-CM | POA: Insufficient documentation

## 2018-11-05 DIAGNOSIS — M545 Other chronic pain: Secondary | ICD-10-CM

## 2018-11-05 DIAGNOSIS — F32A Depression, unspecified: Secondary | ICD-10-CM

## 2018-11-05 DIAGNOSIS — F419 Anxiety disorder, unspecified: Secondary | ICD-10-CM

## 2018-11-05 MED ORDER — OXYCODONE-ACETAMINOPHEN 5-325 MG PO TABS: 1.0000 | ORAL_TABLET | Freq: Three times a day (TID) | ORAL | 0 refills | Status: DC | PRN

## 2018-11-05 MED ORDER — SERTRALINE HCL 50 MG PO TABS: 50.0000 mg | ORAL_TABLET | Freq: Every day | ORAL | 5 refills | Status: DC

## 2018-11-05 NOTE — Progress Notes (Signed)
Patient ID: Natasha Frederick, female   DOB: [birth year], 63 y.o.   MRN: [id number]  This visit type was conducted due to national recommendations for restrictions regarding the COVID-19 pandemic in an effort to limit this patient's exposure and mitigate transmission in our community.   Virtual Visit via Video Note  I connected with Natasha Frederick on 11/05/18 at 10:00 AM EDT by a video enabled telemedicine application and verified that I am speaking with the correct person using two identifiers.  Location patient: home Location provider:work or home office Persons participating in the virtual visit: patient, provider  I discussed the limitations of evaluation and management by telemedicine and the availability of in person appointments. The patient expressed understanding and agreed to proceed.   HPI: Patient just recently got out of rehab.  She had admission back in April for perforated ulcer which was repaired surgically.  She had unfortunate complication of acute ischemia lower extremity and ended up having right below-knee amputation.  She is at home with husband now.  Just got out of rehab late last week.  She has longstanding history of chronic back pain with prior fusion.  She had been taking apparently fairly high-dose chronic ibuprofen prior to her perforation.  Obviously is off nonsteroidals at this time.  She has been treated at rehab with Percocet 5/325 mg 1 at night.  This seemed to be controlling her pain fairly well.  She did have some constipation but improved with senna.  She has had some depression and anxiety symptoms since her admission.  She was being treated with apparently some alprazolam and BuSpar but felt like this was not helping her depression symptoms nor her anxiety much.  She denies any suicidal ideation.   ROS: See pertinent positives and negatives per HPI.  Past Medical History:  Diagnosis Date  . Arthritis    hip  . Endometriosis   . Scoliosis     Past Surgical  History:  Procedure Laterality Date  . AMPUTATION Right 08/08/2018   Procedure: AMPUTATION BELOW KNEE RIGHT LOWER EXTREMITY;  Surgeon: Rosetta Posner, MD;  Location: [place];  Service: Vascular;  Laterality: Right;  . ANTERIOR LAT LUMBAR FUSION Left 01/05/2014   Procedure: LUMBAR TWO TO THREE, LUMBAR LUMBAR THREE TO FOUR, ANTERIOR LATERAL LUMBAR FUSION 2 LEVELS;  Surgeon: Faythe Ghee, MD;  [facility];  Service: Neurosurgery;  Laterality: Left;  . APPENDECTOMY  1963  . BACK SURGERY    . BREAST SURGERY Left 1987   bx  . COLONOSCOPY W/ POLYPECTOMY    . EYE SURGERY    . FEMORAL-POPLITEAL BYPASS GRAFT Bilateral 08/06/2018   Procedure: BILATERAL POPLITEAL AND TIBIAL EMBOLECTOMIES, LEFT LEG FASCIOTOMY;  Surgeon: Rosetta Posner, MD;  Location: [place];  Service: Vascular;  Laterality: Bilateral;  . LAPAROSCOPY N/A 07/30/2018   Procedure: DIAGNOSTIC LAPAROSCOPY, Omentopexy gram patch, Washout of intra-abdominal abcesses x 3;  Surgeon: Michael Boston, MD;  Location: [facility];  Service: General;  Laterality: N/A;  . [place]   left  . Planters wart Left 2005  . radial keratoto Bilateral   . TONSILLECTOMY  1958  . TOTAL HIP ARTHROPLASTY Left 07/19/2014   Procedure: TOTAL HIP ARTHROPLASTY;  Surgeon: Frederik Pear, MD;  Location: [place];  Service: Orthopedics;  Laterality: Left;  . TUBAL LIGATION  1986    Family History  Problem Relation Age of Onset  . Lung cancer Mother   . Stroke Father   . Huntington's disease Paternal Grandmother   .  Huntington's disease Paternal Aunt   . Huntington's disease Paternal Uncle   . Arthritis Neg Hx        family hx  . Cancer Neg Hx        prostate ca -family hx  . Heart disease Neg Hx        family hx    SOCIAL HX: lives at home with husband.  Former smoker.   Current Outpatient Medications:  .  Deutetrabenazine (AUSTEDO) 12 MG TABS, Take 24 mg by mouth 2 (two) times daily., Disp: 360 tablet, Rfl: 1 .  lidocaine (LIDODERM) 5 %, Place 1 patch  onto the skin daily. Remove & Discard patch within 12 hours or as directed by MD, Disp: , Rfl:  .  methocarbamol (ROBAXIN) 500 MG tablet, Take 1 tablet (500 mg total) by mouth 3 (three) times daily., Disp: 30 tablet, Rfl: 0 .  Multiple Vitamin (MULTIVITAMIN WITH MINERALS) TABS tablet, Take 1 tablet by mouth daily., Disp: 30 tablet, Rfl: 0 .  oxyCODONE-acetaminophen (PERCOCET/ROXICET) 5-325 MG tablet, Take 1 tablet by mouth every 8 (eight) hours as needed., Disp: 60 tablet, Rfl: 0 .  pantoprazole (PROTONIX) 40 MG tablet, Take 1 tablet (40 mg total) by mouth 2 (two) times daily., Disp: 60 tablet, Rfl: 1 .  polyethylene glycol (MIRALAX / GLYCOLAX) 17 g packet, Take 17 g by mouth daily., Disp: 14 each, Rfl: 0 .  sertraline (ZOLOFT) 50 MG tablet, Take 1 tablet (50 mg total) by mouth daily., Disp: 30 tablet, Rfl: 5  EXAM:  VITALS per patient if applicable:  GENERAL: alert, oriented, appears well and in no acute distress  HEENT: atraumatic, conjunttiva clear, no obvious abnormalities on inspection of external nose and ears  NECK: normal movements of the head and neck  LUNGS: on inspection no signs of respiratory distress, breathing rate appears normal, no obvious gross SOB, gasping or wheezing  CV: no obvious cyanosis  MS: moves all visible extremities without noticeable abnormality  PSYCH/NEURO: pleasant and cooperative, no obvious depression or anxiety, speech and thought processing grossly intact  ASSESSMENT AND PLAN:  Discussed the following assessment and plan:  Chronic bilateral low back pain without sciatica  -Strict avoidance of all nonsteroidals -We had a long discussion regarding chronic pain management.  She has been now for several months on Percocet but only low-dose of 5 mg at night which seems to be helping.  We discussed current regulations regarding chronic opioid use.  We agreed to write number 60/month to take 1-2 at night as needed for severe back pain -Discussed  measures to avoid constipation  #2 anxiety and depression symptoms -We recommended avoidance of regular use of Xanax especially if taking opioid -We recommended discontinuing low-dose BuSpar which did not seem to be helping -Recommend trial of sertraline 50 mg nightly -3 to 4-week follow-up to reassess     I discussed the assessment and treatment plan with the patient. The patient was provided an opportunity to ask questions and all were answered. The patient agreed with the plan and demonstrated an understanding of the instructions.   The patient was advised to call back or seek an in-person evaluation if the symptoms worsen or if the condition fails to improve as anticipated.    Evelena PeatBruce Burchette, MD

## 2018-11-06 DIAGNOSIS — K668 Other specified disorders of peritoneum: Secondary | ICD-10-CM | POA: Diagnosis not present

## 2018-11-06 DIAGNOSIS — M21372 Foot drop, left foot: Secondary | ICD-10-CM | POA: Diagnosis not present

## 2018-11-06 DIAGNOSIS — K255 Chronic or unspecified gastric ulcer with perforation: Secondary | ICD-10-CM | POA: Diagnosis not present

## 2018-11-06 DIAGNOSIS — Z89511 Acquired absence of right leg below knee: Secondary | ICD-10-CM | POA: Diagnosis not present

## 2018-11-06 DIAGNOSIS — Z4781 Encounter for orthopedic aftercare following surgical amputation: Secondary | ICD-10-CM | POA: Diagnosis not present

## 2018-11-06 DIAGNOSIS — M1612 Unilateral primary osteoarthritis, left hip: Secondary | ICD-10-CM | POA: Diagnosis not present

## 2018-11-07 DIAGNOSIS — Z4781 Encounter for orthopedic aftercare following surgical amputation: Secondary | ICD-10-CM | POA: Diagnosis not present

## 2018-11-07 DIAGNOSIS — K668 Other specified disorders of peritoneum: Secondary | ICD-10-CM | POA: Diagnosis not present

## 2018-11-07 DIAGNOSIS — Z89511 Acquired absence of right leg below knee: Secondary | ICD-10-CM | POA: Diagnosis not present

## 2018-11-07 DIAGNOSIS — M21372 Foot drop, left foot: Secondary | ICD-10-CM | POA: Diagnosis not present

## 2018-11-07 DIAGNOSIS — M1612 Unilateral primary osteoarthritis, left hip: Secondary | ICD-10-CM | POA: Diagnosis not present

## 2018-11-07 DIAGNOSIS — K255 Chronic or unspecified gastric ulcer with perforation: Secondary | ICD-10-CM | POA: Diagnosis not present

## 2018-11-10 DIAGNOSIS — M1612 Unilateral primary osteoarthritis, left hip: Secondary | ICD-10-CM | POA: Diagnosis not present

## 2018-11-10 DIAGNOSIS — M21372 Foot drop, left foot: Secondary | ICD-10-CM | POA: Diagnosis not present

## 2018-11-10 DIAGNOSIS — K255 Chronic or unspecified gastric ulcer with perforation: Secondary | ICD-10-CM | POA: Diagnosis not present

## 2018-11-10 DIAGNOSIS — K668 Other specified disorders of peritoneum: Secondary | ICD-10-CM | POA: Diagnosis not present

## 2018-11-10 DIAGNOSIS — Z89511 Acquired absence of right leg below knee: Secondary | ICD-10-CM | POA: Diagnosis not present

## 2018-11-10 DIAGNOSIS — Z4781 Encounter for orthopedic aftercare following surgical amputation: Secondary | ICD-10-CM | POA: Diagnosis not present

## 2018-11-11 ENCOUNTER — Encounter: Payer: Self-pay | Admitting: Vascular Surgery

## 2018-11-11 ENCOUNTER — Other Ambulatory Visit: Payer: Self-pay

## 2018-11-11 ENCOUNTER — Ambulatory Visit (INDEPENDENT_AMBULATORY_CARE_PROVIDER_SITE_OTHER): Payer: Self-pay | Admitting: Vascular Surgery

## 2018-11-11 VITALS — BP 107/64 | HR 66 | Temp 97.5°F | Resp 20 | Ht 64.0 in | Wt 130.0 lb

## 2018-11-11 DIAGNOSIS — Z89511 Acquired absence of right leg below knee: Secondary | ICD-10-CM

## 2018-11-11 DIAGNOSIS — M21372 Foot drop, left foot: Secondary | ICD-10-CM

## 2018-11-11 NOTE — Progress Notes (Signed)
   Patient name: Natasha Frederick MRN: [id number] DOB: Dec 09, 1955 Sex: female  REASON FOR VISIT: Follow-up right BKA and left arterial thrombectomy  HPI: Natasha Frederick is a 63 y.o. female here today for follow-up.  She is here with her husband as well.  Had spoken with him multiple times during her hospitalization but could never visit with him due to [place].  It was good to see him as well.  She looks quite good today.  She does have a very difficult situation with right below-knee amputation and severe foot drop on the left.  She is seen today for a discussion of potential prosthetic placement.  Current Outpatient Medications  Medication Sig Dispense Refill  . Deutetrabenazine (AUSTEDO) 12 MG TABS Take 24 mg by mouth 2 (two) times daily. 360 tablet 1  . lidocaine (LIDODERM) 5 % Place 1 patch onto the skin daily. Remove & Discard patch within 12 hours or as directed by MD    . methocarbamol (ROBAXIN) 500 MG tablet Take 1 tablet (500 mg total) by mouth 3 (three) times daily. 30 tablet 0  . Multiple Vitamin (MULTIVITAMIN WITH MINERALS) TABS tablet Take 1 tablet by mouth daily. 30 tablet 0  . oxyCODONE-acetaminophen (PERCOCET/ROXICET) 5-325 MG tablet Take 1 tablet by mouth every 8 (eight) hours as needed. 60 tablet 0  . pantoprazole (PROTONIX) 40 MG tablet Take 1 tablet (40 mg total) by mouth 2 (two) times daily. 60 tablet 1  . polyethylene glycol (MIRALAX / GLYCOLAX) 17 g packet Take 17 g by mouth daily. 14 each 0  . sertraline (ZOLOFT) 50 MG tablet Take 1 tablet (50 mg total) by mouth daily. 30 tablet 5   No current facility-administered medications for this visit.      PHYSICAL EXAM: Vitals:   11/11/18 1315  BP: 107/64  Pulse: 66  Resp: 20  Temp: (!) 97.5 F (36.4 C)  SpO2: 97%  Weight: 130 lb (59 kg)  Height: 5\' 4"  (1.626 m)    GENERAL: The patient is a well-nourished female, in no acute distress. The vital signs are documented above. Her right below-knee  amputation is completely healed and looks quite good.  She is having some improvement in her foot drop.  She can raise her foot which was completely flaccid on my last visit with her.  She has 2-3+ right popliteal pulse and 2+ left posterior tibial pulse  MEDICAL ISSUES: I feel that she is safe to continue fitting for prosthesis.  She has been wearing her stump shrinker and has had excellent healing.  I did explain the difficult situation with amputation and contralateral foot drop which will make it more difficult for her with physical therapy.  I feel it is appropriate to begin prosthetic fitting.  She was reassured with this and will see [place] again on an as-needed basis   Rosetta Posner, MD [facility] Tel [phone] Pager [phone]

## 2018-11-12 ENCOUNTER — Telehealth: Payer: Self-pay

## 2018-11-12 DIAGNOSIS — Z4781 Encounter for orthopedic aftercare following surgical amputation: Secondary | ICD-10-CM | POA: Diagnosis not present

## 2018-11-12 DIAGNOSIS — K668 Other specified disorders of peritoneum: Secondary | ICD-10-CM | POA: Diagnosis not present

## 2018-11-12 DIAGNOSIS — M1612 Unilateral primary osteoarthritis, left hip: Secondary | ICD-10-CM | POA: Diagnosis not present

## 2018-11-12 DIAGNOSIS — M21372 Foot drop, left foot: Secondary | ICD-10-CM | POA: Diagnosis not present

## 2018-11-12 DIAGNOSIS — K255 Chronic or unspecified gastric ulcer with perforation: Secondary | ICD-10-CM | POA: Diagnosis not present

## 2018-11-12 DIAGNOSIS — Z89511 Acquired absence of right leg below knee: Secondary | ICD-10-CM | POA: Diagnosis not present

## 2018-11-12 NOTE — Telephone Encounter (Signed)
Called and left a voice message for patient to call back and schedule a Doxy virtual visit around August 28th or August 31st.  OK for [facility] to advise/discuss.  CRM Created.

## 2018-11-14 DIAGNOSIS — Z89511 Acquired absence of right leg below knee: Secondary | ICD-10-CM | POA: Diagnosis not present

## 2018-11-14 DIAGNOSIS — Z4781 Encounter for orthopedic aftercare following surgical amputation: Secondary | ICD-10-CM | POA: Diagnosis not present

## 2018-11-14 DIAGNOSIS — K255 Chronic or unspecified gastric ulcer with perforation: Secondary | ICD-10-CM | POA: Diagnosis not present

## 2018-11-14 DIAGNOSIS — M1612 Unilateral primary osteoarthritis, left hip: Secondary | ICD-10-CM | POA: Diagnosis not present

## 2018-11-14 DIAGNOSIS — M21372 Foot drop, left foot: Secondary | ICD-10-CM | POA: Diagnosis not present

## 2018-11-14 DIAGNOSIS — K668 Other specified disorders of peritoneum: Secondary | ICD-10-CM | POA: Diagnosis not present

## 2018-11-17 DIAGNOSIS — K668 Other specified disorders of peritoneum: Secondary | ICD-10-CM | POA: Diagnosis not present

## 2018-11-17 DIAGNOSIS — M21372 Foot drop, left foot: Secondary | ICD-10-CM | POA: Diagnosis not present

## 2018-11-17 DIAGNOSIS — Z4781 Encounter for orthopedic aftercare following surgical amputation: Secondary | ICD-10-CM | POA: Diagnosis not present

## 2018-11-17 DIAGNOSIS — K255 Chronic or unspecified gastric ulcer with perforation: Secondary | ICD-10-CM | POA: Diagnosis not present

## 2018-11-17 DIAGNOSIS — Z89511 Acquired absence of right leg below knee: Secondary | ICD-10-CM | POA: Diagnosis not present

## 2018-11-17 DIAGNOSIS — M1612 Unilateral primary osteoarthritis, left hip: Secondary | ICD-10-CM | POA: Diagnosis not present

## 2018-11-18 ENCOUNTER — Encounter: Payer: Self-pay | Admitting: Gastroenterology

## 2018-11-18 ENCOUNTER — Ambulatory Visit (INDEPENDENT_AMBULATORY_CARE_PROVIDER_SITE_OTHER): Payer: Medicare Other | Admitting: Gastroenterology

## 2018-11-18 ENCOUNTER — Other Ambulatory Visit: Payer: Self-pay

## 2018-11-18 VITALS — BP 106/58 | HR 76 | Temp 98.4°F

## 2018-11-18 DIAGNOSIS — I739 Peripheral vascular disease, unspecified: Secondary | ICD-10-CM | POA: Diagnosis not present

## 2018-11-18 DIAGNOSIS — M21372 Foot drop, left foot: Secondary | ICD-10-CM | POA: Diagnosis not present

## 2018-11-18 DIAGNOSIS — K668 Other specified disorders of peritoneum: Secondary | ICD-10-CM | POA: Diagnosis not present

## 2018-11-18 DIAGNOSIS — M1612 Unilateral primary osteoarthritis, left hip: Secondary | ICD-10-CM | POA: Diagnosis not present

## 2018-11-18 DIAGNOSIS — K255 Chronic or unspecified gastric ulcer with perforation: Secondary | ICD-10-CM | POA: Diagnosis not present

## 2018-11-18 DIAGNOSIS — Z89511 Acquired absence of right leg below knee: Secondary | ICD-10-CM | POA: Diagnosis not present

## 2018-11-18 DIAGNOSIS — Z4781 Encounter for orthopedic aftercare following surgical amputation: Secondary | ICD-10-CM | POA: Diagnosis not present

## 2018-11-18 NOTE — Patient Instructions (Addendum)
If you are age 65 or older, your body mass index should be between 23-30. Your There is no height or weight on file to calculate BMI. If this is out of the aforementioned range listed, please consider follow up with your Primary Care Provider.  If you are age 25 or younger, your body mass index should be between 19-25. Your There is no height or weight on file to calculate BMI. If this is out of the aformentioned range listed, please consider follow up with your Primary Care Provider.   You have been scheduled for an endoscopy. Please follow written instructions given to you at your visit today. If you use inhalers (even only as needed), please bring them with you on the day of your procedure.  Due to recent COVID-19 restrictions implemented by our local and state authorities and in an effort to keep both patients and staff as safe as possible, our hospital system now requires COVID-19 testing prior to any scheduled hospital procedure. Please go to our [facility] location drive thru testing site ([address], [place], [place] [address]) on 11-21-2018, any time between 9:30am - 3 pm (Wednesday hours are 9:30 am-12 pm and Saturday hours are 9 am-12:30 pm). There will be multiple testing areas, the first checkpoint being for pre-procedure/surgery testing. Get into the right (yellow) lane that leads to the PAT testing team. You will not be billed at the time of testing but may receive a bill later depending on your insurance. The approximate cost of the test is $100. You must agree to quarantine from the time of your testing until the procedure date on 11-25-2018 . This should include staying at home with ONLY the people you live with. Avoid take-out, grocery store shopping or leaving the house for any non-emergent reason. Please call our office at [phone] if you have any questions.    It was a pleasure to see you today!  Dr. Loletha Carrow

## 2018-11-18 NOTE — Progress Notes (Signed)
[facility] Consult Note:  History: Natasha BeckmannJudith Frederick 11/18/2018  Referring provider: Karie SodaSteven Gross, MD  Reason for consult/chief complaint: gastric ulcer (recent hospitalization for perforated gastric ulcer; had surgery by Dr Michaell CowingGross; indicates that she feels fine now; no dark tarry stools; no abdominal pain; no reflux; would like to discuss endoscopy to check for healing)   Subjective  HPI: Referred by Dr. gross of surgery for follow-up after perforated gastric ulcer along the lesser curve (operative report reviewed).  Patient admitted late April with peritonitis and septic shock from a perforated ulcer.  History and physical notes chronic pain with ibuprofen use.  She underwent emergent surgery with placement of a Graham patch, abdominal washout and drain placement.  Prolonged recovery complicated by distal embolism right leg about 6 days postop, required right BKA.  Prolonged hospital stay and rehabilitation.  Dr. Gordy Saversgross's note indicates the patient may have had C. difficile as well treated with vancomycin.  Natasha ClosJudith has slowly improved after a long recovery.  She denies abdominal pain, nausea, vomiting early satiety or recent weight loss.  She has no hematemesis or melena.  Chronic constipation treated with MiraLAX.  She also remains on PPI for the ulcer.  No longer on NSAIDs.   ROS:  Review of Systems  Constitutional: Negative for appetite change and unexpected weight change.  HENT: Negative for mouth sores and voice change.   Eyes: Negative for pain and redness.  Respiratory: Negative for cough and shortness of breath.   Cardiovascular: Negative for chest pain and palpitations.  Genitourinary: Negative for dysuria and hematuria.  Musculoskeletal: Negative for arthralgias and myalgias.       Weakness from Huntington's disease  Skin: Negative for pallor and rash.  Neurological: Negative for weakness and headaches.  Hematological: Negative for adenopathy.   Psychiatric/Behavioral:       Anxiety     Past Medical History: Past Medical History:  Diagnosis Date  . Acute embolism and thrombosis of deep vein of both distal lower extremities (HCC)   . Arthritis    hip  . C. difficile diarrhea   . Endometriosis   . Huntington disease (HCC)   . Peptic ulcer   . Perforated gastric ulcer (HCC)   . Scoliosis      Past Surgical History: Past Surgical History:  Procedure Laterality Date  . AMPUTATION Right 08/08/2018   Procedure: AMPUTATION BELOW KNEE RIGHT LOWER EXTREMITY;  Surgeon: Larina EarthlyEarly, Todd F, MD;  [facility];  Service: Vascular;  Laterality: Right;  . ANTERIOR LAT LUMBAR FUSION Left 01/05/2014   Procedure: LUMBAR TWO TO THREE, LUMBAR LUMBAR THREE TO FOUR, ANTERIOR LATERAL LUMBAR FUSION 2 LEVELS;  Surgeon: Reinaldo Meekerandy O Kritzer, MD;  Location: [facility];  Service: Neurosurgery;  Laterality: Left;  . APPENDECTOMY  1963  . BREAST SURGERY Left 1987   bx  . COLONOSCOPY W/ POLYPECTOMY    . EYE SURGERY Bilateral   . FEMORAL-POPLITEAL BYPASS GRAFT Bilateral 08/06/2018   Procedure: BILATERAL POPLITEAL AND TIBIAL EMBOLECTOMIES, LEFT LEG FASCIOTOMY;  Surgeon: Larina EarthlyEarly, Todd F, MD;  [facility];  Service: Vascular;  Laterality: Bilateral;  . LAPAROSCOPY N/A 07/30/2018   Procedure: DIAGNOSTIC LAPAROSCOPY, Omentopexy gram patch, Washout of intra-abdominal abcesses x 3;  Surgeon: Karie SodaGross, Steven, MD;  Location: [facility];  Service: General;  Laterality: N/A;  . OOPHORECTOMY Left 1977  . Planters wart Left 2005  . radial keratoto Bilateral   . TONSILLECTOMY  1958  . TOTAL HIP ARTHROPLASTY Left 07/19/2014   Procedure: TOTAL HIP  ARTHROPLASTY;  Surgeon: Gean BirchwoodFrank Rowan, MD;  [facility];  Service: Orthopedics;  Laterality: Left;  . TUBAL LIGATION  1986     Family History: Family History  Problem Relation Age of Onset  . Lung cancer Mother   . Stroke Father   . Huntington's disease Paternal Grandmother   . Huntington's disease Paternal Aunt   .  Huntington's disease Paternal Uncle   . Arthritis Neg Hx        family hx  . Cancer Neg Hx        prostate ca -family hx  . Heart disease Neg Hx        family hx  . Colon cancer Neg Hx   . Esophageal cancer Neg Hx   . Pancreatic cancer Neg Hx   . Liver disease Neg Hx     Social History: Social History   Socioeconomic History  . Marital status: Married    Spouse name: Not on file  . Number of children: Not on file  . Years of education: Not on file  . Highest education level: Not on file  Occupational History  . Occupation: unemployed    Comment: disabled back pain  Social Needs  . Financial resource strain: Not on file  . Food insecurity    Worry: Not on file    Inability: Not on file  . Transportation needs    Medical: Not on file    Non-medical: Not on file  Tobacco Use  . Smoking status: Former Smoker    Packs/day: 1.00    Years: 8.00    Pack years: 8.00    Types: Cigarettes    Quit date: 01/17/1994    Years since quitting: 24.8  . Smokeless tobacco: Never Used  Substance and Sexual Activity  . Alcohol use: Yes    Alcohol/week: 3.0 standard drinks    Types: 3 Cans of beer per week    Comment: per week  . Drug use: No  . Sexual activity: Not on file  Lifestyle  . Physical activity    Days per week: Not on file    Minutes per session: Not on file  . Stress: Not on file  Relationships  . Social Musicianconnections    Talks on phone: Not on file    Gets together: Not on file    Attends religious service: Not on file    Active member of club or organization: Not on file    Attends meetings of clubs or organizations: Not on file    Relationship status: Not on file  Other Topics Concern  . Not on file  Social History Narrative   ** Merged History Encounter **        Allergies: Allergies  Allergen Reactions  . Bee Venom Anaphylaxis and Swelling  . Nsaids Other (See Comments)    PERFORATED ULCER = NO MORE NSAIDs  . Adhesive [Tape] Other (See Comments)     redness  . Codeine   . Penicillins   . Sulfa Antibiotics     Outpatient Meds: Current Outpatient Medications  Medication Sig Dispense Refill  . acetaminophen (TYLENOL) 500 MG tablet Take 1,000 mg by mouth every morning.    . Deutetrabenazine (AUSTEDO) 12 MG TABS Take 24 mg by mouth 2 (two) times daily. 360 tablet 1  . oxyCODONE-acetaminophen (PERCOCET/ROXICET) 5-325 MG tablet Take 1 tablet by mouth every 8 (eight) hours as needed. 60 tablet 0  . sertraline (ZOLOFT) 50 MG tablet Take 1 tablet (50 mg total)  by mouth daily. 30 tablet 5   No current facility-administered medications for this visit.       ___________________________________________________________________ Objective   Exam:  BP (!) 106/58   Pulse 76   Temp 98.4 F (36.9 C)    General: Chronically ill-appearing woman seated in a wheelchair.  Her husband is present for the entire encounter.  Poor muscle mass   eyes: sclera anicteric, no redness  ENT: oral mucosa moist without lesions, no cervical or supraclavicular lymphadenopathy  CV: RRR without murmur, S1/S2, no JVD, no peripheral edema in left leg.  Right BKA  Resp: clear to auscultation bilaterally, normal RR and effort noted  GI: soft, no tenderness, with active bowel sounds. No guarding or palpable organomegaly noted.  Skin; warm and dry, no rash or jaundice noted  Neuro: awake, alert and oriented x 3. Normal gross motor function and fluent speech  Labs:  CBC Latest Ref Rng & Units 08/13/2018 08/10/2018 08/09/2018  WBC 4.0 - 10.5 K/uL 16.2(H) 18.8(H) 19.3(H)  Hemoglobin 12.0 - 15.0 g/dL 8.8(L) 8.6(L) 8.5(L)  Hematocrit 36.0 - 46.0 % 27.4(L) 26.5(L) 26.9(L)  Platelets 150 - 400 K/uL 1,200(HH) 883(H) 504(H)   CMP Latest Ref Rng & Units 08/13/2018 08/10/2018 08/09/2018  Glucose 70 - 99 mg/dL 93 100(H) 115(H)  BUN 8 - 23 mg/dL 5(L) 7(L) 8  Creatinine 0.44 - 1.00 mg/dL 0.52 0.42(L) 0.45  Sodium 135 - 145 mmol/L 136 134(L) 134(L)  Potassium 3.5 - 5.1 mmol/L  3.3(L) 3.4(L) 4.0  Chloride 98 - 111 mmol/L 103 98 102  CO2 22 - 32 mmol/L 23 27 23   Calcium 8.9 - 10.3 mg/dL 8.1(L) 8.0(L) 8.0(L)  Total Protein 6.5 - 8.1 g/dL - - -  Total Bilirubin 0.3 - 1.2 mg/dL - - -  Alkaline Phos 38 - 126 U/L - - -  AST 15 - 41 U/L - - -  ALT 0 - 44 U/L - - -   No more recent labs available  Radiologic Studies:  CLINICAL DATA:  Generalized abdominal pain, left lower quadrant pain.   EXAM: CT ABDOMEN AND PELVIS WITH CONTRAST   TECHNIQUE: Multidetector CT imaging of the abdomen and pelvis was performed using the standard protocol following bolus administration of intravenous contrast.   CONTRAST:  130mL OMNIPAQUE IOHEXOL 300 MG/ML  SOLN   COMPARISON:  None   FINDINGS: Lower chest: Small bilateral pleural effusions. Bibasilar atelectasis. Heart is normal size.   Hepatobiliary: No focal hepatic abnormality. Gallbladder unremarkable.   Pancreas: No focal abnormality or ductal dilatation.   Spleen: No focal abnormality.  Normal size.   Adrenals/Urinary Tract: Wedge-shaped low-density and area of decreased enhancement in the midpole of the right kidney could reflect renal infarct. No hydronephrosis. Adrenal glands and left kidney unremarkable. Urinary bladder grossly unremarkable.   Stomach/Bowel: Gastric wall appears thickened in the distal stomach region. Small bowel is decompressed and grossly unremarkable. Left colonic diverticulosis. No active diverticulitis. Moderate stool burden in the colon.   Vascular/Lymphatic: Aortic atherosclerosis. No enlarged abdominal or pelvic lymph nodes.   Reproductive: No visible pelvic masses. Beam hardening artifact from the left hip replacement obscures lower pelvic structures.   Other: Large volume ascites and large amount of pneumoperitoneum noted in the abdomen. Exact source is difficult to determine. The mid to distal gastric wall does appear thickened possibly related to gastritis on coronal image  47, there is gas collection noted which appears to be along the lesser curvature of the stomach, possibly within the wall, possibly representing  large ulceration.   Musculoskeletal: Postoperative fusion changes in the lumbar spine. Diffuse degenerative disc disease changes. No acute bony abnormality. Prior left hip replacement.   IMPRESSION: Large volume pneumoperitoneum and ascites in the abdomen. Exact source is difficult to determine, but favor gastric. The mid to distal gastric wall appears thickened and there may be large ulceration along the lesser curvature.   Left colonic diverticulosis.  No active diverticulitis.   Aortic atherosclerosis.   Wedge-shaped low-density area in the midpole of the right kidney may reflect renal infarct.   Small bilateral pleural effusions, bibasilar atelectasis.   These results were called by telephone at the time of interpretation on 07/30/2018 at 9:16 pm to [facility] SouthCHRISTOPHER LAWYER , who verbally acknowledged these results.     Electronically Signed   By: Charlett NoseKevin  Dover M.D.   On: 07/30/2018 21:18  CT images personally reviewed  Assessment: Encounter Diagnoses  Name Primary?  . Chronic gastric ulcer with perforation (HCC) Yes  . Peripheral arterial disease (HCC)     Gastric ulcer, most likely related to NSAID use, perforation several months ago treated with urgent surgery.  IgA H. pylori antibody was done during hospitalization, but not optimal testing.    Peripheral arterial disease requiring right BKA, distant smoking history.  Plan:  Upper endoscopy to assess ulcer healing while on PPI, if still present biopsy to rule out malignancy or H. pylori.  She is agreeable after discussion of procedure and risks.  Must be done in the hospital endoscopy lab since she is nonambulatory.  The benefits and risks of the planned procedure were described in detail with the patient or (when appropriate) their health care proxy.  Risks were outlined as  including, but not limited to, bleeding, infection, perforation, adverse medication reaction leading to cardiac or pulmonary decompensation.  The limitation of incomplete mucosal visualization was also discussed.  No guarantees or warranties were given.   Thank you for the courtesy of this consult.  Please call me with any questions or concerns.  Charlie PitterHenry L Danis III  CC: Referring provider noted above

## 2018-11-20 ENCOUNTER — Other Ambulatory Visit: Payer: Self-pay

## 2018-11-20 DIAGNOSIS — I739 Peripheral vascular disease, unspecified: Secondary | ICD-10-CM

## 2018-11-20 DIAGNOSIS — K255 Chronic or unspecified gastric ulcer with perforation: Secondary | ICD-10-CM

## 2018-11-21 ENCOUNTER — Other Ambulatory Visit (HOSPITAL_COMMUNITY)
Admission: RE | Admit: 2018-11-21 | Discharge: 2018-11-21 | Disposition: A | Discharge status: Home / Self Care | Payer: Medicare Other | Source: Ambulatory Visit | Attending: Gastroenterology | Admitting: Gastroenterology

## 2018-11-21 DIAGNOSIS — Z20828 Contact with and (suspected) exposure to other viral communicable diseases: Secondary | ICD-10-CM | POA: Diagnosis not present

## 2018-11-21 DIAGNOSIS — Z01812 Encounter for preprocedural laboratory examination: Secondary | ICD-10-CM | POA: Diagnosis not present

## 2018-11-21 LAB — SARS CORONAVIRUS 2 (TAT 6-24 HRS): SARS Coronavirus 2: NEGATIVE

## 2018-11-24 ENCOUNTER — Encounter (HOSPITAL_COMMUNITY): Payer: Self-pay | Admitting: *Deleted

## 2018-11-24 ENCOUNTER — Other Ambulatory Visit: Payer: Self-pay

## 2018-11-25 ENCOUNTER — Telehealth: Payer: Self-pay | Admitting: Gastroenterology

## 2018-11-25 ENCOUNTER — Other Ambulatory Visit: Payer: Self-pay | Admitting: Gastroenterology

## 2018-11-25 ENCOUNTER — Encounter (HOSPITAL_COMMUNITY): Payer: Self-pay | Admitting: Registered Nurse

## 2018-11-25 ENCOUNTER — Ambulatory Visit (HOSPITAL_COMMUNITY): Payer: Medicare Other | Admitting: Anesthesiology

## 2018-11-25 ENCOUNTER — Encounter (HOSPITAL_COMMUNITY)
Admission: RE | Disposition: A | Discharge status: Home / Self Care | Payer: Self-pay | Source: Home / Self Care | Attending: Gastroenterology

## 2018-11-25 ENCOUNTER — Ambulatory Visit (HOSPITAL_COMMUNITY)
Admission: RE | Admit: 2018-11-25 | Discharge: 2018-11-25 | Disposition: A | Discharge status: Home / Self Care | Payer: Medicare Other | Attending: Gastroenterology | Admitting: Gastroenterology

## 2018-11-25 DIAGNOSIS — Z86718 Personal history of other venous thrombosis and embolism: Secondary | ICD-10-CM | POA: Insufficient documentation

## 2018-11-25 DIAGNOSIS — K255 Chronic or unspecified gastric ulcer with perforation: Secondary | ICD-10-CM | POA: Diagnosis not present

## 2018-11-25 DIAGNOSIS — Z86010 Personal history of colonic polyps: Secondary | ICD-10-CM | POA: Diagnosis not present

## 2018-11-25 DIAGNOSIS — K222 Esophageal obstruction: Secondary | ICD-10-CM | POA: Diagnosis not present

## 2018-11-25 DIAGNOSIS — M1610 Unilateral primary osteoarthritis, unspecified hip: Secondary | ICD-10-CM | POA: Insufficient documentation

## 2018-11-25 DIAGNOSIS — Z96642 Presence of left artificial hip joint: Secondary | ICD-10-CM | POA: Diagnosis not present

## 2018-11-25 DIAGNOSIS — K449 Diaphragmatic hernia without obstruction or gangrene: Secondary | ICD-10-CM | POA: Insufficient documentation

## 2018-11-25 DIAGNOSIS — Z91030 Bee allergy status: Secondary | ICD-10-CM | POA: Insufficient documentation

## 2018-11-25 DIAGNOSIS — Z885 Allergy status to narcotic agent status: Secondary | ICD-10-CM | POA: Diagnosis not present

## 2018-11-25 DIAGNOSIS — Z882 Allergy status to sulfonamides status: Secondary | ICD-10-CM | POA: Insufficient documentation

## 2018-11-25 DIAGNOSIS — Z888 Allergy status to other drugs, medicaments and biological substances status: Secondary | ICD-10-CM | POA: Diagnosis not present

## 2018-11-25 DIAGNOSIS — I739 Peripheral vascular disease, unspecified: Secondary | ICD-10-CM

## 2018-11-25 DIAGNOSIS — K259 Gastric ulcer, unspecified as acute or chronic, without hemorrhage or perforation: Secondary | ICD-10-CM | POA: Diagnosis not present

## 2018-11-25 DIAGNOSIS — Z8711 Personal history of peptic ulcer disease: Secondary | ICD-10-CM | POA: Diagnosis present

## 2018-11-25 DIAGNOSIS — K2950 Unspecified chronic gastritis without bleeding: Secondary | ICD-10-CM | POA: Diagnosis not present

## 2018-11-25 DIAGNOSIS — Z880 Allergy status to penicillin: Secondary | ICD-10-CM | POA: Diagnosis not present

## 2018-11-25 DIAGNOSIS — M419 Scoliosis, unspecified: Secondary | ICD-10-CM | POA: Diagnosis not present

## 2018-11-25 DIAGNOSIS — Z91048 Other nonmedicinal substance allergy status: Secondary | ICD-10-CM | POA: Insufficient documentation

## 2018-11-25 DIAGNOSIS — G10 Huntington's disease: Secondary | ICD-10-CM | POA: Diagnosis not present

## 2018-11-25 DIAGNOSIS — M1612 Unilateral primary osteoarthritis, left hip: Secondary | ICD-10-CM | POA: Diagnosis not present

## 2018-11-25 DIAGNOSIS — Z89511 Acquired absence of right leg below knee: Secondary | ICD-10-CM | POA: Diagnosis not present

## 2018-11-25 HISTORY — PX: ESOPHAGOGASTRODUODENOSCOPY (EGD) WITH PROPOFOL: SHX5813

## 2018-11-25 HISTORY — PX: BIOPSY: SHX5522

## 2018-11-25 SURGERY — ESOPHAGOGASTRODUODENOSCOPY (EGD) WITH PROPOFOL
Anesthesia: Monitor Anesthesia Care

## 2018-11-25 MED ORDER — OMEPRAZOLE 40 MG PO CPDR: 40.0000 mg | DELAYED_RELEASE_CAPSULE | Freq: Every day | ORAL | 0 refills | Status: DC

## 2018-11-25 MED ORDER — PROPOFOL 10 MG/ML IV BOLUS
INTRAVENOUS | Status: DC | PRN
  Administered 2018-11-25: 30 mg via INTRAVENOUS
  Administered 2018-11-25 (×3): 20 mg via INTRAVENOUS
  Administered 2018-11-25: 30 mg via INTRAVENOUS

## 2018-11-25 MED ORDER — PROPOFOL 10 MG/ML IV BOLUS
INTRAVENOUS | Status: AC
  Filled 2018-11-25: qty 20

## 2018-11-25 MED ORDER — SODIUM CHLORIDE 0.9 % IV SOLN: INTRAVENOUS | Status: DC

## 2018-11-25 MED ORDER — LACTATED RINGERS IV SOLN
INTRAVENOUS | Status: DC
  Administered 2018-11-25: 11:00:00 via INTRAVENOUS

## 2018-11-25 MED ORDER — LIDOCAINE 2% (20 MG/ML) 5 ML SYRINGE
INTRAMUSCULAR | Status: DC | PRN
  Administered 2018-11-25: 40 mg via INTRAVENOUS

## 2018-11-25 SURGICAL SUPPLY — 15 items

## 2018-11-25 NOTE — Anesthesia Preprocedure Evaluation (Addendum)
Anesthesia Evaluation  Patient identified by MRN, date of birth, ID band Patient awake    Reviewed: Allergy & Precautions, NPO status , Patient's Chart, lab work & pertinent test results  History of Anesthesia Complications Negative for: history of anesthetic complications  Airway Mallampati: II  TM Distance: >3 FB Neck ROM: Full    Dental  (+) Dental Advisory Given, Teeth Intact   Pulmonary former smoker,    Pulmonary exam normal        Cardiovascular + Peripheral Vascular Disease and + DVT  Normal cardiovascular exam     Neuro/Psych  Huntington disease Left foot drop  negative psych ROS   GI/Hepatic PUD, (+)     substance abuse  ,   Endo/Other  negative endocrine ROS  Renal/GU negative Renal ROS     Musculoskeletal  (+) Arthritis , narcotic dependent  Abdominal   Peds  Hematology negative hematology ROS (+)   Anesthesia Other Findings   Reproductive/Obstetrics                            Anesthesia Physical Anesthesia Plan  ASA: III  Anesthesia Plan: MAC   Post-op Pain Management:    Induction: Intravenous  PONV Risk Score and Plan: 2 and Propofol infusion and Treatment may vary due to age or medical condition  Airway Management Planned: Nasal Cannula and Natural Airway  Additional Equipment: None  Intra-op Plan:   Post-operative Plan:   Informed Consent: I have reviewed the patients History and Physical, chart, labs and discussed the procedure including the risks, benefits and alternatives for the proposed anesthesia with the patient or authorized representative who has indicated his/her understanding and acceptance.       Plan Discussed with: CRNA and Anesthesiologist  Anesthesia Plan Comments:        Anesthesia Quick Evaluation

## 2018-11-25 NOTE — Telephone Encounter (Signed)
Reviewed the chart, Dr. Loletha Carrow electronically sent script to [facility] in [place] on [address]. Reported this to the husband. Verbalized understanding. No other questions or concerns voiced at the time of the call.

## 2018-11-25 NOTE — Anesthesia Procedure Notes (Signed)
Date/Time: 11/25/2018 11:41 AM Performed by: Talbot Grumbling, CRNA Oxygen Delivery Method: Nasal cannula

## 2018-11-25 NOTE — Op Note (Signed)
[facility] Patient Name: Natasha BeckmannJudith Frederick Procedure Date: 11/25/2018 MRN: [id number] Attending MD: Starr LakeHenry L. 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                            Patietn had been using NSAIDs regularly) Providers:                Sherilyn CooterHenry L. Myrtie Neitheranis, MD, Zoe LanJenny Kappus, RN, Matthew FolksBrooke                            Cummings, Technician Referring MD:             Karie SodaSteven Gross, MD Medicines:                Monitored Anesthesia Care Complications:            No immediate complications. Estimated Blood Loss:     Estimated blood loss was minimal. Procedure:                Pre-Anesthesia Assessment:                           - Prior to the procedure, a History and Physical                            was performed, and patient medications and                            allergies were reviewed. The patient's tolerance of                            previous anesthesia was also reviewed. The risks                            and benefits of the procedure and the sedation                            options and risks were discussed with the patient.                            All questions were answered, and informed consent                            was obtained. Prior Anticoagulants: The patient has                            taken no previous anticoagulant or antiplatelet  agents. ASA Grade Assessment: III - A patient with                            severe systemic disease. After reviewing the risks                            and benefits, the patient was deemed in                            satisfactory condition to undergo the procedure.                           After obtaining informed consent, the endoscope was                    passed under direct vision. Throughout the                            procedure, the patient's blood pressure, pulse, and                            oxygen saturations were monitored continuously. The                            GIF-H190 ([id number]) Olympus gastroscope was                            introduced through the mouth, and advanced to the                            second part of duodenum. The upper GI endoscopy was                            accomplished without difficulty. The patient                            tolerated the procedure well. Scope In: Scope Out: Findings:      A small hiatal hernia was present.      A widely patent Schatzki ring was found at the gastroesophageal junction.      The exam of the esophagus was otherwise normal.      One non-bleeding superficial gastric ulcer with no stigmata of bleeding       was found at the incisura. The lesion was 4 mm in largest dimension with       mildly edematous (but benign-appearing) surrounding tissue. Biopsies       were taken with a cold forceps from the ulcer edge for histology.       Additional cold biopsies were taken from the gastric antrum and body for       histology to rule out H. pylori infection.      The exam of the stomach was otherwise normal.      The examined duodenum was normal. Impression:               - Small hiatal hernia.                           -  Widely patent Schatzki ring.                           - Non-bleeding gastric ulcer with no stigmata of                            bleeding. Biopsied.                           - Normal examined duodenum. Moderate Sedation:      Not Applicable - Patient had care per Anesthesia. Recommendation:           - Patient has a contact number available for                            emergencies. The signs and symptoms of potential                            delayed complications were discussed with the                            patient. Return  to normal activities tomorrow.                            Written discharge instructions were provided to the                            patient.                           - Resume previous diet.                           - Continue present medications.                           - Use Prilosec (omeprazole) 40 mg PO daily for 8                            weeks to aid ulcer healing.                           - Await pathology results. Procedure Code(s):        --- Professional ---                           [phone], Esophagogastroduodenoscopy, flexible,                            transoral; with biopsy, single or multiple Diagnosis Code(s):        --- Professional ---                           K25.9, Gastric ulcer, unspecified as acute or                            chronic, without  hemorrhage or perforation                           K25.5, Chronic or unspecified gastric ulcer with                            perforation CPT copyright 2019 American Medical Association. All rights reserved. The codes documented in this report are preliminary and upon coder review may  be revised to meet current compliance requirements. Henry L. Myrtie Neitheranis, MD 11/25/2018 12:04:55 PM This report has been signed electronically. Number of Addenda: 0

## 2018-11-25 NOTE — H&P (Signed)
History:  This patient presents for endoscopic testing for follow up of gastric ulcer.  Doug Sou Referring physician: Eulas Post, MD  Past Medical History: Past Medical History:  Diagnosis Date  . Acute embolism and thrombosis of deep vein of both distal lower extremities (HCC)   . Arthritis    hip  . C. difficile diarrhea   . Endometriosis   . Huntington disease ([place])   . Peptic ulcer   . Perforated gastric ulcer ([place])   . Scoliosis      Past Surgical History: Past Surgical History:  Procedure Laterality Date  . AMPUTATION Right 08/08/2018   Procedure: AMPUTATION BELOW KNEE RIGHT LOWER EXTREMITY;  Surgeon: Rosetta Posner, MD;  Location: [facility];  Service: Vascular;  Laterality: Right;  . ANTERIOR LAT LUMBAR FUSION Left 01/05/2014   Procedure: LUMBAR TWO TO THREE, LUMBAR LUMBAR THREE TO FOUR, ANTERIOR LATERAL LUMBAR FUSION 2 LEVELS;  Surgeon: Faythe Ghee, MD;  Location: [facility];  Service: Neurosurgery;  Laterality: Left;  . APPENDECTOMY  1963  . BREAST SURGERY Left 1987   bx  . COLONOSCOPY W/ POLYPECTOMY    . EYE SURGERY Bilateral   . FEMORAL-POPLITEAL BYPASS GRAFT Bilateral 08/06/2018   Procedure: BILATERAL POPLITEAL AND TIBIAL EMBOLECTOMIES, LEFT LEG FASCIOTOMY;  Surgeon: Rosetta Posner, MD;  Location: [facility];  Service: Vascular;  Laterality: Bilateral;  . LAPAROSCOPY N/A 07/30/2018   Procedure: DIAGNOSTIC LAPAROSCOPY, Omentopexy gram patch, Washout of intra-abdominal abcesses x 3;  Surgeon: Michael Boston, MD;  Location: [facility];  Service: General;  Laterality: N/A;  . OOPHORECTOMY Left 1977  . Planters wart Left 2005  . radial keratoto Bilateral   . TONSILLECTOMY  1958  . TOTAL HIP ARTHROPLASTY Left 07/19/2014   Procedure: TOTAL HIP ARTHROPLASTY;  Surgeon: Frederik Pear, MD;  Location: [facility];  Service: Orthopedics;  Laterality: Left;  . TUBAL LIGATION  1986    Allergies: Allergies  Allergen Reactions  . Bee Venom Anaphylaxis and Swelling  . Nsaids Other (See  Comments)    PERFORATED ULCER = NO MORE NSAIDs  . Adhesive [Tape] Other (See Comments)    redness  . Codeine Nausea And Vomiting  . Penicillins     Did it involve swelling of the face/tongue/throat, SOB, or low BP? Yes Did it involve sudden or severe rash/hives, skin peeling, or any reaction on the inside of your mouth or nose? Unknown Did you need to seek medical attention at a hospital or doctor's office? Yes When did it last happen?age [age] If all above answers are "NO", may proceed with cephalosporin use.   . Sulfa Antibiotics Itching    Outpatient Meds: Current Facility-Administered Medications  Medication Dose Route Frequency Provider Last Rate Last Dose  . 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 No guarding or palpable organomegaly noted.  Neuro: awake, alert and oriented x 3. Normal gross motor function and fluent speech   Assessment:  Hx perforated GU requiring surgery   Plan:  EGD with biopsy  Nelida Meuse III

## 2018-11-25 NOTE — Interval H&P Note (Signed)
History and Physical Interval Note:  11/25/2018 11:29 AM  Natasha Frederick  has presented today for surgery, with the diagnosis of Gastric ulcer.  The various methods of treatment have been discussed with the patient and family. After consideration of risks, benefits and other options for treatment, the patient has consented to  Procedure(s): ESOPHAGOGASTRODUODENOSCOPY (EGD) WITH PROPOFOL (N/A) as a surgical intervention.  The patient's history has been reviewed, patient examined, no change in status, stable for surgery.  I have reviewed the patient's chart and labs.  Questions were answered to the patient's satisfaction.     Nelida Meuse III

## 2018-11-25 NOTE — Transfer of Care (Signed)
Immediate Anesthesia Transfer of Care Note  Patient: Natasha Frederick  Procedure(s) Performed: ESOPHAGOGASTRODUODENOSCOPY (EGD) WITH PROPOFOL (N/A ) BIOPSY  Patient Location: PACU  Anesthesia Type:MAC  Level of Consciousness: sedated  Airway & Oxygen Therapy: Patient Spontanous Breathing and Patient connected to nasal cannula oxygen  Post-op Assessment: Report given to RN and Post -op Vital signs reviewed and stable  Post vital signs: Reviewed and stable  Last Vitals:  Vitals Value Taken Time  BP    Temp    Pulse    Resp    SpO2      Last Pain:  Vitals:   11/25/18 1028  TempSrc: Temporal  PainSc: 0-No pain         Complications: No apparent anesthesia complications

## 2018-11-26 ENCOUNTER — Encounter (HOSPITAL_COMMUNITY): Payer: Self-pay | Admitting: Gastroenterology

## 2018-11-26 DIAGNOSIS — K255 Chronic or unspecified gastric ulcer with perforation: Secondary | ICD-10-CM | POA: Diagnosis not present

## 2018-11-26 DIAGNOSIS — Z89511 Acquired absence of right leg below knee: Secondary | ICD-10-CM | POA: Diagnosis not present

## 2018-11-26 DIAGNOSIS — M21372 Foot drop, left foot: Secondary | ICD-10-CM | POA: Diagnosis not present

## 2018-11-26 DIAGNOSIS — K668 Other specified disorders of peritoneum: Secondary | ICD-10-CM | POA: Diagnosis not present

## 2018-11-26 DIAGNOSIS — M1612 Unilateral primary osteoarthritis, left hip: Secondary | ICD-10-CM | POA: Diagnosis not present

## 2018-11-26 DIAGNOSIS — Z4781 Encounter for orthopedic aftercare following surgical amputation: Secondary | ICD-10-CM | POA: Diagnosis not present

## 2018-11-26 NOTE — Anesthesia Postprocedure Evaluation (Signed)
Anesthesia Post Note  Patient: Natasha [facility]  Procedure(s) Performed: ESOPHAGOGASTRODUODENOSCOPY (EGD) WITH PROPOFOL (N/A ) BIOPSY     Patient location during evaluation: PACU Anesthesia Type: MAC Level of consciousness: awake and alert Pain management: pain level controlled Vital Signs Assessment: post-procedure vital signs reviewed and stable Respiratory status: spontaneous breathing Cardiovascular status: stable Anesthetic complications: no    Last Vitals:  Vitals:   11/25/18 1200 11/25/18 1204  BP: (!) 103/52 106/70  Pulse: 66 61  Resp: 16 (!) 23  Temp:    SpO2: 100% 99%    Last Pain:  Vitals:   11/26/18 1440  TempSrc:   PainSc: 0-No pain                 Nolon Nations

## 2018-11-27 DIAGNOSIS — I70201 Unspecified atherosclerosis of native arteries of extremities, right leg: Secondary | ICD-10-CM | POA: Diagnosis not present

## 2018-11-27 DIAGNOSIS — M419 Scoliosis, unspecified: Secondary | ICD-10-CM | POA: Diagnosis not present

## 2018-11-27 DIAGNOSIS — Z89511 Acquired absence of right leg below knee: Secondary | ICD-10-CM | POA: Diagnosis not present

## 2018-11-27 DIAGNOSIS — K255 Chronic or unspecified gastric ulcer with perforation: Secondary | ICD-10-CM | POA: Diagnosis not present

## 2018-11-27 DIAGNOSIS — Z791 Long term (current) use of non-steroidal anti-inflammatories (NSAID): Secondary | ICD-10-CM | POA: Diagnosis not present

## 2018-11-27 DIAGNOSIS — M1612 Unilateral primary osteoarthritis, left hip: Secondary | ICD-10-CM | POA: Diagnosis not present

## 2018-11-27 DIAGNOSIS — M21372 Foot drop, left foot: Secondary | ICD-10-CM | POA: Diagnosis not present

## 2018-11-27 DIAGNOSIS — Z4781 Encounter for orthopedic aftercare following surgical amputation: Secondary | ICD-10-CM | POA: Diagnosis not present

## 2018-11-27 DIAGNOSIS — G10 Huntington's disease: Secondary | ICD-10-CM | POA: Diagnosis not present

## 2018-11-27 DIAGNOSIS — K668 Other specified disorders of peritoneum: Secondary | ICD-10-CM | POA: Diagnosis not present

## 2018-12-01 DIAGNOSIS — Z4781 Encounter for orthopedic aftercare following surgical amputation: Secondary | ICD-10-CM | POA: Diagnosis not present

## 2018-12-01 DIAGNOSIS — M21372 Foot drop, left foot: Secondary | ICD-10-CM | POA: Diagnosis not present

## 2018-12-01 DIAGNOSIS — M1612 Unilateral primary osteoarthritis, left hip: Secondary | ICD-10-CM | POA: Diagnosis not present

## 2018-12-01 DIAGNOSIS — Z89511 Acquired absence of right leg below knee: Secondary | ICD-10-CM | POA: Diagnosis not present

## 2018-12-01 DIAGNOSIS — K255 Chronic or unspecified gastric ulcer with perforation: Secondary | ICD-10-CM | POA: Diagnosis not present

## 2018-12-01 DIAGNOSIS — K668 Other specified disorders of peritoneum: Secondary | ICD-10-CM | POA: Diagnosis not present

## 2018-12-04 ENCOUNTER — Telehealth: Payer: Self-pay | Admitting: Family Medicine

## 2018-12-04 NOTE — Telephone Encounter (Signed)
Routed incorrectly  °

## 2018-12-04 NOTE — Telephone Encounter (Signed)
Medication : oxyCODONE-acetaminophen (PERCOCET/ROXICET) 5-325 MG tablet    Patient is requesting refill.     Pharmacy:  [facility] [place], [place] - [address] OF [address] [phone] (Phone) [phone] (Fax

## 2018-12-05 ENCOUNTER — Telehealth: Payer: Self-pay

## 2018-12-05 DIAGNOSIS — Z4781 Encounter for orthopedic aftercare following surgical amputation: Secondary | ICD-10-CM | POA: Diagnosis not present

## 2018-12-05 DIAGNOSIS — K255 Chronic or unspecified gastric ulcer with perforation: Secondary | ICD-10-CM | POA: Diagnosis not present

## 2018-12-05 DIAGNOSIS — M1612 Unilateral primary osteoarthritis, left hip: Secondary | ICD-10-CM | POA: Diagnosis not present

## 2018-12-05 DIAGNOSIS — Z89511 Acquired absence of right leg below knee: Secondary | ICD-10-CM | POA: Diagnosis not present

## 2018-12-05 DIAGNOSIS — K668 Other specified disorders of peritoneum: Secondary | ICD-10-CM | POA: Diagnosis not present

## 2018-12-05 DIAGNOSIS — M21372 Foot drop, left foot: Secondary | ICD-10-CM | POA: Diagnosis not present

## 2018-12-05 MED ORDER — OXYCODONE-ACETAMINOPHEN 5-325 MG PO TABS: 1.0000 | ORAL_TABLET | Freq: Three times a day (TID) | ORAL | 0 refills | Status: DC | PRN

## 2018-12-05 NOTE — Telephone Encounter (Signed)
Okay to give verbal order for requested services

## 2018-12-05 NOTE — Telephone Encounter (Signed)
Virtual visit scheduled for 9/14/

## 2018-12-05 NOTE — Telephone Encounter (Signed)
Refill oxycodone for 1 month.  Needs virtual follow-up within a few weeks to reassess her recent initiated sertraline therapy.

## 2018-12-05 NOTE — Telephone Encounter (Signed)
Copied from [place] [id number]. Topic: General - Inquiry >> Dec 05, 2018 12:32 PM Richardo Priest, [place] wrote: Reason for CRM: Laurey Arrow, PT, wanted to inform CMA and PCP that PT orders are going to be extended to 1 week 5, due to waiting on prosthesis for patient. Please advise. Call back is [phone].

## 2018-12-05 NOTE — Telephone Encounter (Signed)
[facility] and gave him verbal OK per Dr. Elease Hashimoto. Laurey Arrow verbalized an understanding.

## 2018-12-05 NOTE — Telephone Encounter (Signed)
Please see message.  Please advise. 

## 2018-12-11 DIAGNOSIS — Z89511 Acquired absence of right leg below knee: Secondary | ICD-10-CM | POA: Diagnosis not present

## 2018-12-11 DIAGNOSIS — M1612 Unilateral primary osteoarthritis, left hip: Secondary | ICD-10-CM | POA: Diagnosis not present

## 2018-12-11 DIAGNOSIS — K255 Chronic or unspecified gastric ulcer with perforation: Secondary | ICD-10-CM | POA: Diagnosis not present

## 2018-12-11 DIAGNOSIS — Z4781 Encounter for orthopedic aftercare following surgical amputation: Secondary | ICD-10-CM | POA: Diagnosis not present

## 2018-12-11 DIAGNOSIS — M21372 Foot drop, left foot: Secondary | ICD-10-CM | POA: Diagnosis not present

## 2018-12-11 DIAGNOSIS — K668 Other specified disorders of peritoneum: Secondary | ICD-10-CM | POA: Diagnosis not present

## 2018-12-16 ENCOUNTER — Other Ambulatory Visit: Payer: Self-pay

## 2018-12-16 ENCOUNTER — Encounter: Payer: Self-pay | Admitting: Family Medicine

## 2018-12-16 ENCOUNTER — Ambulatory Visit (INDEPENDENT_AMBULATORY_CARE_PROVIDER_SITE_OTHER): Payer: Medicare Other | Admitting: Family Medicine

## 2018-12-16 VITALS — BP 110/68 | HR 68 | Temp 97.3°F

## 2018-12-16 DIAGNOSIS — Z4781 Encounter for orthopedic aftercare following surgical amputation: Secondary | ICD-10-CM | POA: Diagnosis not present

## 2018-12-16 DIAGNOSIS — L02414 Cutaneous abscess of left upper limb: Secondary | ICD-10-CM | POA: Diagnosis not present

## 2018-12-16 DIAGNOSIS — M21372 Foot drop, left foot: Secondary | ICD-10-CM | POA: Diagnosis not present

## 2018-12-16 DIAGNOSIS — Z89511 Acquired absence of right leg below knee: Secondary | ICD-10-CM | POA: Diagnosis not present

## 2018-12-16 DIAGNOSIS — M1612 Unilateral primary osteoarthritis, left hip: Secondary | ICD-10-CM | POA: Diagnosis not present

## 2018-12-16 DIAGNOSIS — K668 Other specified disorders of peritoneum: Secondary | ICD-10-CM | POA: Diagnosis not present

## 2018-12-16 DIAGNOSIS — K255 Chronic or unspecified gastric ulcer with perforation: Secondary | ICD-10-CM | POA: Diagnosis not present

## 2018-12-16 NOTE — Patient Instructions (Signed)
Skin Abscess  A skin abscess is an infected area on or under your skin that contains a collection of pus and other material. An abscess may also be called a furuncle, carbuncle, or boil. An abscess can occur in or on almost any part of your body. Some abscesses break open (rupture) on their own. Most continue to get worse unless they are treated. The infection can spread deeper into the body and eventually into your blood, which can make you feel ill. Treatment usually involves draining the abscess. What are the causes? An abscess occurs when germs, like bacteria, pass through your skin and cause an infection. This may be caused by:  A scrape or cut on your skin.  A puncture wound through your skin, including a needle injection or insect bite.  Blocked oil or sweat glands.  Blocked and infected hair follicles.  A cyst that forms beneath your skin (sebaceous cyst) and becomes infected. What increases the risk? This condition is more likely to develop in people who:  Have a weak body defense system (immune system).  Have diabetes.  Have dry and irritated skin.  Get frequent injections or use illegal IV drugs.  Have a foreign body in a wound, such as a splinter.  Have problems with their lymph system or veins. What are the signs or symptoms? Symptoms of this condition include:  A painful, firm bump under the skin.  A bump with pus at the top. This may break through the skin and drain. Other symptoms include:  Redness surrounding the abscess site.  Warmth.  Swelling of the lymph nodes (glands) near the abscess.  Tenderness.  A sore on the skin. How is this diagnosed? This condition may be diagnosed based on:  A physical exam.  Your medical history.  A sample of pus. This may be used to find out what is causing the infection.  Blood tests.  Imaging tests, such as an ultrasound, CT scan, or MRI. How is this treated? A small abscess that drains on its own may not  need treatment. Treatment for larger abscesses may include:  Moist heat or heat pack applied to the area several times a day.  A procedure to drain the abscess (incision and drainage).  Antibiotic medicines. For a severe abscess, you may first get antibiotics through an IV and then change to antibiotics by mouth. Follow these instructions at home: Medicines   Take over-the-counter and prescription medicines only as told by your health care provider.  If you were prescribed an antibiotic medicine, take it as told by your health care provider. Do not stop taking the antibiotic even if you start to feel better. Abscess care   If you have an abscess that has not drained, apply heat to the affected area. Use the heat source that your health care provider recommends, such as a moist heat pack or a heating pad. ? Place a towel between your skin and the heat source. ? Leave the heat on for 20-30 minutes. ? Remove the heat if your skin turns bright red. This is especially important if you are unable to feel pain, heat, or cold. You may have a greater risk of getting burned.  Follow instructions from your health care provider about how to take care of your abscess. Make sure you: ? Cover the abscess with a bandage (dressing). ? Change your dressing or gauze as told by your health care provider. ? Wash your hands with soap and water before you change the   dressing or gauze. If soap and water are not available, use hand sanitizer.  Check your abscess every day for signs of a worsening infection. Check for: ? More redness, swelling, or pain. ? More fluid or blood. ? Warmth. ? More pus or a bad smell. General instructions  To avoid spreading the infection: ? Do not share personal care items, towels, or hot tubs with others. ? Avoid making skin contact with other people.  Keep all follow-up visits as told by your health care provider. This is important. Contact a health care provider if you  have:  More redness, swelling, or pain around your abscess.  More fluid or blood coming from your abscess.  Warm skin around your abscess.  More pus or a bad smell coming from your abscess.  A fever.  Muscle aches.  Chills or a general ill feeling. Get help right away if you:  Have severe pain.  See red streaks on your skin spreading away from the abscess. Summary  A skin abscess is an infected area on or under your skin that contains a collection of pus and other material.  A small abscess that drains on its own may not need treatment.  Treatment for larger abscesses may include having a procedure to drain the abscess and taking an antibiotic. This information is not intended to replace advice given to you by your health care provider. Make sure you discuss any questions you have with your health care provider. Document Released: 01/03/2005 Document Revised: 07/17/2018 Document Reviewed: 05/09/2017 Elsevier Patient Education  2020 [employer]  

## 2018-12-16 NOTE — Progress Notes (Signed)
Subjective:     Patient ID: Natasha BeckmannJudith Frederick, female   DOB: 10/10/1955, 63 y.o.   MRN: [id number]  HPI Patient is seen with a "lump "left axillary region.  She noticed this first 2 or 3 days ago.  She noticed some redness and swelling.  No drainage.  Not aware of any prior history of cyst in this region.  She had called recently with some depression symptoms and we started sertraline.  She does feel like this has helped.  She has not had any major side effects and tolerating well.  She feels her mood is more upbeat.  She had been taking BuSpar prior to that which did not seem to help any.  Refer to recent note from 11/05/2018 for details.  She is looking at right lower extremity prosthesis soon.  Past Medical History:  Diagnosis Date  . Acute embolism and thrombosis of deep vein of both distal lower extremities (HCC)   . Arthritis    hip  . C. difficile diarrhea   . Endometriosis   . Huntington disease (HCC)   . Peptic ulcer   . Perforated gastric ulcer (HCC)   . Scoliosis    Past Surgical History:  Procedure Laterality Date  . AMPUTATION Right 08/08/2018   Procedure: AMPUTATION BELOW KNEE RIGHT LOWER EXTREMITY;  Surgeon: Larina EarthlyEarly, Todd F, MD;  [facility];  Service: Vascular;  Laterality: Right;  . ANTERIOR LAT LUMBAR FUSION Left 01/05/2014   Procedure: LUMBAR TWO TO THREE, LUMBAR LUMBAR THREE TO FOUR, ANTERIOR LATERAL LUMBAR FUSION 2 LEVELS;  Surgeon: Reinaldo Meekerandy O Kritzer, MD;  [facility];  Service: Neurosurgery;  Laterality: Left;  . APPENDECTOMY  1963  . BIOPSY  11/25/2018   Procedure: BIOPSY;  Surgeon: Sherrilyn Ristanis, Henry L III, MD;  [facility];  Service: Gastroenterology;;  . BREAST SURGERY Left 1987   bx  . COLONOSCOPY W/ POLYPECTOMY    . ESOPHAGOGASTRODUODENOSCOPY (EGD) WITH PROPOFOL N/A 11/25/2018   Procedure: ESOPHAGOGASTRODUODENOSCOPY (EGD) WITH PROPOFOL;  Surgeon: Sherrilyn Ristanis, Henry L III, MD;  [facility];  Service: Gastroenterology;  Laterality: N/A;  . EYE  SURGERY Bilateral   . FEMORAL-POPLITEAL BYPASS GRAFT Bilateral 08/06/2018   Procedure: BILATERAL POPLITEAL AND TIBIAL EMBOLECTOMIES, LEFT LEG FASCIOTOMY;  Surgeon: Larina EarthlyEarly, Todd F, MD;  [facility];  Service: Vascular;  Laterality: Bilateral;  . LAPAROSCOPY N/A 07/30/2018   Procedure: DIAGNOSTIC LAPAROSCOPY, Omentopexy gram patch, Washout of intra-abdominal abcesses x 3;  Surgeon: Karie SodaGross, Steven, MD;  [facility];  Service: General;  Laterality: N/A;  . OOPHORECTOMY Left 1977  . Planters wart Left 2005  . radial keratoto Bilateral   . TONSILLECTOMY  1958  . TOTAL HIP ARTHROPLASTY Left 07/19/2014   Procedure: TOTAL HIP ARTHROPLASTY;  Surgeon: Gean BirchwoodFrank Rowan, MD;  [facility];  Service: Orthopedics;  Laterality: Left;  . TUBAL LIGATION  1986    reports that she quit smoking about 24 years ago. Her smoking use included cigarettes. She has a 8.00 pack-year smoking history. She has never used smokeless tobacco. She reports current alcohol use of about 3.0 standard drinks of alcohol per week. She reports that she does not use drugs. family history includes Huntington's disease in her paternal aunt, paternal grandmother, and paternal uncle; Lung cancer in her mother; Stroke in her father. Allergies  Allergen Reactions  . Bee Venom Anaphylaxis and Swelling  . Nsaids Other (See Comments)    PERFORATED ULCER = NO MORE NSAIDs  . Adhesive [Tape] Other (See Comments)  redness  . Codeine Nausea And Vomiting  . Penicillins     Did it involve swelling of the face/tongue/throat, SOB, or low BP? Yes Did it involve sudden or severe rash/hives, skin peeling, or any reaction on the inside of your mouth or nose? Unknown Did you need to seek medical attention at a hospital or doctor's office? Yes When did it last happen?age 27-40 If all above answers are "NO", may proceed with cephalosporin use.   . Sulfa Antibiotics Itching     Review of Systems  Constitutional: Negative for chills and  fever.       Objective:   Physical Exam Constitutional:      Appearance: Normal appearance.  Cardiovascular:     Rate and Rhythm: Normal rate and regular rhythm.  Pulmonary:     Effort: Pulmonary effort is normal.     Breath sounds: Normal breath sounds.  Skin:    Comments: Patient has area of erythema approximately 1-1/2 to 2 cm diameter left upper inner arm.  This is slightly swollen and slightly fluctuant near the center.  Neurological:     Mental Status: She is alert.        Assessment:     Small abscess left inner arm.  Question abscessed sebaceous cyst    Plan:     -We recommended incision and drainage.  Discussed risk and benefits including risk of bleeding, scarring and patient consented  -We prepped skin with Betadine.  Anesthesia with 1% plain Xylocaine.  Using #11 blade made [place] proximally 1 cm incision over the area of fluctuance and removed significant amount of pus and contents of abscessed sebaceous cyst.  Minimal bleeding.  Wound cavity was packed with quarter inch iodoform gauze.  Outer dressing applied -We recommend she try to keep dressing and wound dry and follow-up no later than Friday for packing removal and wound reassessment  Eulas Post MD [facility] at [facility]

## 2018-12-19 ENCOUNTER — Other Ambulatory Visit: Payer: Self-pay

## 2018-12-19 ENCOUNTER — Encounter: Payer: Self-pay | Admitting: Family Medicine

## 2018-12-19 ENCOUNTER — Ambulatory Visit (INDEPENDENT_AMBULATORY_CARE_PROVIDER_SITE_OTHER): Payer: Medicare Other | Admitting: Family Medicine

## 2018-12-19 ENCOUNTER — Ambulatory Visit: Payer: Self-pay | Admitting: Family Medicine

## 2018-12-19 VITALS — BP 110/70 | HR 77 | Temp 98.3°F

## 2018-12-19 DIAGNOSIS — Z23 Encounter for immunization: Secondary | ICD-10-CM | POA: Diagnosis not present

## 2018-12-19 DIAGNOSIS — F419 Anxiety disorder, unspecified: Secondary | ICD-10-CM

## 2018-12-19 DIAGNOSIS — L02414 Cutaneous abscess of left upper limb: Secondary | ICD-10-CM

## 2018-12-19 DIAGNOSIS — F32A Depression, unspecified: Secondary | ICD-10-CM

## 2018-12-19 DIAGNOSIS — F329 Major depressive disorder, single episode, unspecified: Secondary | ICD-10-CM

## 2018-12-19 NOTE — Progress Notes (Signed)
Subjective:     Patient ID: Natasha Frederick, female   DOB: 1955-12-21, 63 y.o.   MRN: [id number]  HPI   Patient is seen for follow-up regarding abscess left arm.  She has much less pain.  Packing did come out with a change of outer dressing yesterday.  She has much less redness.  No fever.  She is also requesting flu vaccine today  This visit was initially set up to follow up visit 11/05/18 for anxiety and depression.  We started Zoloft and she has tolerated well .  She does feel this has helped with both.  No side effects.    Past Medical History:  Diagnosis Date  . Acute embolism and thrombosis of deep vein of both distal lower extremities (HCC)   . Arthritis    hip  . C. difficile diarrhea   . Endometriosis   . Huntington disease ([place])   . Peptic ulcer   . Perforated gastric ulcer ([place])   . Scoliosis    Past Surgical History:  Procedure Laterality Date  . AMPUTATION Right 08/08/2018   Procedure: AMPUTATION BELOW KNEE RIGHT LOWER EXTREMITY;  Surgeon: Rosetta Posner, MD;  Location: [facility];  Service: Vascular;  Laterality: Right;  . ANTERIOR LAT LUMBAR FUSION Left 01/05/2014   Procedure: LUMBAR TWO TO THREE, LUMBAR LUMBAR THREE TO FOUR, ANTERIOR LATERAL LUMBAR FUSION 2 LEVELS;  Surgeon: Faythe Ghee, MD;  [facility];  Service: Neurosurgery;  Laterality: Left;  . APPENDECTOMY  1963  . BIOPSY  11/25/2018   Procedure: BIOPSY;  Surgeon: Doran Stabler, MD;  [facility];  Service: Gastroenterology;;  . BREAST SURGERY Left 1987   bx  . COLONOSCOPY W/ POLYPECTOMY    . ESOPHAGOGASTRODUODENOSCOPY (EGD) WITH PROPOFOL N/A 11/25/2018   Procedure: ESOPHAGOGASTRODUODENOSCOPY (EGD) WITH PROPOFOL;  Surgeon: Doran Stabler, MD;  [facility];  Service: Gastroenterology;  Laterality: N/A;  . EYE SURGERY Bilateral   . FEMORAL-POPLITEAL BYPASS GRAFT Bilateral 08/06/2018   Procedure: BILATERAL POPLITEAL AND TIBIAL EMBOLECTOMIES, LEFT LEG FASCIOTOMY;  Surgeon: Rosetta Posner, MD;  Location: [facility];  Service: Vascular;  Laterality: Bilateral;  . LAPAROSCOPY N/A 07/30/2018   Procedure: DIAGNOSTIC LAPAROSCOPY, Omentopexy gram patch, Washout of intra-abdominal abcesses x 3;  Surgeon: Michael Boston, MD;  [facility];  Service: General;  Laterality: N/A;  . OOPHORECTOMY Left 1977  . Planters wart Left 2005  . radial keratoto Bilateral   . TONSILLECTOMY  1958  . TOTAL HIP ARTHROPLASTY Left 07/19/2014   Procedure: TOTAL HIP ARTHROPLASTY;  Surgeon: Frederik Pear, MD;  Location: [place];  Service: Orthopedics;  Laterality: Left;  . TUBAL LIGATION  1986    reports that she quit smoking about 24 years ago. Her smoking use included cigarettes. She has a 8.00 pack-year smoking history. She has never used smokeless tobacco. She reports current alcohol use of about 3.0 standard drinks of alcohol per week. She reports that she does not use drugs. family history includes Huntington's disease in her paternal aunt, paternal grandmother, and paternal uncle; Lung cancer in her mother; Stroke in her father. Allergies  Allergen Reactions  . Bee Venom Anaphylaxis and Swelling  . Nsaids Other (See Comments)    PERFORATED ULCER = NO MORE NSAIDs  . Adhesive [Tape] Other (See Comments)    redness  . Codeine Nausea And Vomiting  . Penicillins     Did it involve swelling of the face/tongue/throat, SOB, or low BP? Yes Did it involve  sudden or severe rash/hives, skin peeling, or any reaction on the inside of your mouth or nose? Unknown Did you need to seek medical attention at a hospital or doctor's office? Yes When did it last happen?age 37-40 If all above answers are "NO", may proceed with cephalosporin use.   . Sulfa Antibiotics Itching     Review of Systems  Constitutional: Negative for chills and fever.       Objective:   Physical Exam Constitutional:      Appearance: Normal appearance.  Cardiovascular:     Rate and Rhythm: Normal rate and regular rhythm.   Pulmonary:     Effort: Pulmonary effort is normal.     Breath sounds: Normal breath sounds.  Skin:    Comments: Left inner arm reveals much less erythema and swelling.  She has a small amount of residual remnants of sebaceous cyst that which are expressed.  There is no fluctuance.  Neurological:     Mental Status: She is alert.        Assessment:     #1 recent abscess left arm improved following incision and drainage.  No indication for repacking at this time  #2 Anxiety and depression- improved on Zoloft.     Plan:     -Keep wound clean with soap and water keep covered until closed over.  Follow-up for any recurrent redness, swelling, pain, or other concerns  -Flu vaccine given  -recommend minimum of 4 to 6 months on Zoloft.    Kristian CoveyBruce W Burchette MD [facility]

## 2018-12-19 NOTE — Patient Instructions (Signed)
Clean daily with soap and water  Follow up for any recurrent redness, pain, or other concerns.

## 2018-12-21 DIAGNOSIS — F329 Major depressive disorder, single episode, unspecified: Secondary | ICD-10-CM | POA: Insufficient documentation

## 2018-12-21 DIAGNOSIS — F32A Depression, unspecified: Secondary | ICD-10-CM | POA: Insufficient documentation

## 2018-12-21 DIAGNOSIS — F419 Anxiety disorder, unspecified: Secondary | ICD-10-CM | POA: Insufficient documentation

## 2018-12-22 ENCOUNTER — Telehealth: Payer: Self-pay | Admitting: Family Medicine

## 2018-12-26 DIAGNOSIS — M21372 Foot drop, left foot: Secondary | ICD-10-CM | POA: Diagnosis not present

## 2018-12-26 DIAGNOSIS — K668 Other specified disorders of peritoneum: Secondary | ICD-10-CM | POA: Diagnosis not present

## 2018-12-26 DIAGNOSIS — Z89511 Acquired absence of right leg below knee: Secondary | ICD-10-CM | POA: Diagnosis not present

## 2018-12-26 DIAGNOSIS — Z4781 Encounter for orthopedic aftercare following surgical amputation: Secondary | ICD-10-CM | POA: Diagnosis not present

## 2018-12-26 DIAGNOSIS — K255 Chronic or unspecified gastric ulcer with perforation: Secondary | ICD-10-CM | POA: Diagnosis not present

## 2018-12-26 DIAGNOSIS — M1612 Unilateral primary osteoarthritis, left hip: Secondary | ICD-10-CM | POA: Diagnosis not present

## 2018-12-27 DIAGNOSIS — Z4781 Encounter for orthopedic aftercare following surgical amputation: Secondary | ICD-10-CM | POA: Diagnosis not present

## 2018-12-27 DIAGNOSIS — Z791 Long term (current) use of non-steroidal anti-inflammatories (NSAID): Secondary | ICD-10-CM | POA: Diagnosis not present

## 2018-12-27 DIAGNOSIS — K668 Other specified disorders of peritoneum: Secondary | ICD-10-CM | POA: Diagnosis not present

## 2018-12-27 DIAGNOSIS — I70201 Unspecified atherosclerosis of native arteries of extremities, right leg: Secondary | ICD-10-CM | POA: Diagnosis not present

## 2018-12-27 DIAGNOSIS — G10 Huntington's disease: Secondary | ICD-10-CM | POA: Diagnosis not present

## 2018-12-27 DIAGNOSIS — M21372 Foot drop, left foot: Secondary | ICD-10-CM | POA: Diagnosis not present

## 2018-12-27 DIAGNOSIS — M1612 Unilateral primary osteoarthritis, left hip: Secondary | ICD-10-CM | POA: Diagnosis not present

## 2018-12-27 DIAGNOSIS — K255 Chronic or unspecified gastric ulcer with perforation: Secondary | ICD-10-CM | POA: Diagnosis not present

## 2018-12-27 DIAGNOSIS — M419 Scoliosis, unspecified: Secondary | ICD-10-CM | POA: Diagnosis not present

## 2018-12-27 DIAGNOSIS — Z89511 Acquired absence of right leg below knee: Secondary | ICD-10-CM | POA: Diagnosis not present

## 2018-12-29 ENCOUNTER — Telehealth: Payer: Self-pay

## 2018-12-29 NOTE — Telephone Encounter (Signed)
FYI  Copied from [place] [id number]. Topic: General - Other >> Dec 26, 2018  4:44 PM Rainey Pines A wrote: [facility] called to inform Dr. Elease Hashimoto that patient has been recertified for another month of home physical  therapy and receive her prothesis this week as well. Laure[facility] Arrow best contact number [phone]

## 2018-12-30 DIAGNOSIS — M1612 Unilateral primary osteoarthritis, left hip: Secondary | ICD-10-CM | POA: Diagnosis not present

## 2018-12-30 DIAGNOSIS — M21372 Foot drop, left foot: Secondary | ICD-10-CM | POA: Diagnosis not present

## 2018-12-30 DIAGNOSIS — G10 Huntington's disease: Secondary | ICD-10-CM | POA: Diagnosis not present

## 2018-12-30 DIAGNOSIS — Z89511 Acquired absence of right leg below knee: Secondary | ICD-10-CM | POA: Diagnosis not present

## 2018-12-30 DIAGNOSIS — K668 Other specified disorders of peritoneum: Secondary | ICD-10-CM | POA: Diagnosis not present

## 2018-12-30 DIAGNOSIS — Z4781 Encounter for orthopedic aftercare following surgical amputation: Secondary | ICD-10-CM | POA: Diagnosis not present

## 2018-12-30 DIAGNOSIS — K255 Chronic or unspecified gastric ulcer with perforation: Secondary | ICD-10-CM | POA: Diagnosis not present

## 2019-01-01 DIAGNOSIS — Z4781 Encounter for orthopedic aftercare following surgical amputation: Secondary | ICD-10-CM | POA: Diagnosis not present

## 2019-01-01 DIAGNOSIS — M21372 Foot drop, left foot: Secondary | ICD-10-CM | POA: Diagnosis not present

## 2019-01-01 DIAGNOSIS — M1612 Unilateral primary osteoarthritis, left hip: Secondary | ICD-10-CM | POA: Diagnosis not present

## 2019-01-01 DIAGNOSIS — K255 Chronic or unspecified gastric ulcer with perforation: Secondary | ICD-10-CM | POA: Diagnosis not present

## 2019-01-01 DIAGNOSIS — Z89511 Acquired absence of right leg below knee: Secondary | ICD-10-CM | POA: Diagnosis not present

## 2019-01-01 DIAGNOSIS — K668 Other specified disorders of peritoneum: Secondary | ICD-10-CM | POA: Diagnosis not present

## 2019-01-07 DIAGNOSIS — Z4781 Encounter for orthopedic aftercare following surgical amputation: Secondary | ICD-10-CM | POA: Diagnosis not present

## 2019-01-07 DIAGNOSIS — M21372 Foot drop, left foot: Secondary | ICD-10-CM | POA: Diagnosis not present

## 2019-01-07 DIAGNOSIS — Z89511 Acquired absence of right leg below knee: Secondary | ICD-10-CM | POA: Diagnosis not present

## 2019-01-07 DIAGNOSIS — M1612 Unilateral primary osteoarthritis, left hip: Secondary | ICD-10-CM | POA: Diagnosis not present

## 2019-01-07 DIAGNOSIS — K255 Chronic or unspecified gastric ulcer with perforation: Secondary | ICD-10-CM | POA: Diagnosis not present

## 2019-01-07 DIAGNOSIS — K668 Other specified disorders of peritoneum: Secondary | ICD-10-CM | POA: Diagnosis not present

## 2019-01-09 DIAGNOSIS — Z4781 Encounter for orthopedic aftercare following surgical amputation: Secondary | ICD-10-CM | POA: Diagnosis not present

## 2019-01-09 DIAGNOSIS — M21372 Foot drop, left foot: Secondary | ICD-10-CM | POA: Diagnosis not present

## 2019-01-09 DIAGNOSIS — Z89511 Acquired absence of right leg below knee: Secondary | ICD-10-CM | POA: Diagnosis not present

## 2019-01-09 DIAGNOSIS — K255 Chronic or unspecified gastric ulcer with perforation: Secondary | ICD-10-CM | POA: Diagnosis not present

## 2019-01-09 DIAGNOSIS — M1612 Unilateral primary osteoarthritis, left hip: Secondary | ICD-10-CM | POA: Diagnosis not present

## 2019-01-09 DIAGNOSIS — K668 Other specified disorders of peritoneum: Secondary | ICD-10-CM | POA: Diagnosis not present

## 2019-01-13 DIAGNOSIS — Z89511 Acquired absence of right leg below knee: Secondary | ICD-10-CM | POA: Diagnosis not present

## 2019-01-13 DIAGNOSIS — M21372 Foot drop, left foot: Secondary | ICD-10-CM | POA: Diagnosis not present

## 2019-01-13 DIAGNOSIS — Z4781 Encounter for orthopedic aftercare following surgical amputation: Secondary | ICD-10-CM | POA: Diagnosis not present

## 2019-01-13 DIAGNOSIS — M1612 Unilateral primary osteoarthritis, left hip: Secondary | ICD-10-CM | POA: Diagnosis not present

## 2019-01-13 DIAGNOSIS — K255 Chronic or unspecified gastric ulcer with perforation: Secondary | ICD-10-CM | POA: Diagnosis not present

## 2019-01-13 DIAGNOSIS — K668 Other specified disorders of peritoneum: Secondary | ICD-10-CM | POA: Diagnosis not present

## 2019-01-16 DIAGNOSIS — K668 Other specified disorders of peritoneum: Secondary | ICD-10-CM | POA: Diagnosis not present

## 2019-01-16 DIAGNOSIS — K255 Chronic or unspecified gastric ulcer with perforation: Secondary | ICD-10-CM | POA: Diagnosis not present

## 2019-01-16 DIAGNOSIS — Z4781 Encounter for orthopedic aftercare following surgical amputation: Secondary | ICD-10-CM | POA: Diagnosis not present

## 2019-01-16 DIAGNOSIS — Z89511 Acquired absence of right leg below knee: Secondary | ICD-10-CM | POA: Diagnosis not present

## 2019-01-16 DIAGNOSIS — M21372 Foot drop, left foot: Secondary | ICD-10-CM | POA: Diagnosis not present

## 2019-01-16 DIAGNOSIS — M1612 Unilateral primary osteoarthritis, left hip: Secondary | ICD-10-CM | POA: Diagnosis not present

## 2019-01-20 ENCOUNTER — Telehealth: Payer: Self-pay

## 2019-01-20 DIAGNOSIS — Z4781 Encounter for orthopedic aftercare following surgical amputation: Secondary | ICD-10-CM | POA: Diagnosis not present

## 2019-01-20 DIAGNOSIS — M21372 Foot drop, left foot: Secondary | ICD-10-CM | POA: Diagnosis not present

## 2019-01-20 DIAGNOSIS — K668 Other specified disorders of peritoneum: Secondary | ICD-10-CM | POA: Diagnosis not present

## 2019-01-20 DIAGNOSIS — S88111A Complete traumatic amputation at level between knee and ankle, right lower leg, initial encounter: Secondary | ICD-10-CM

## 2019-01-20 DIAGNOSIS — K255 Chronic or unspecified gastric ulcer with perforation: Secondary | ICD-10-CM | POA: Diagnosis not present

## 2019-01-20 DIAGNOSIS — Z89511 Acquired absence of right leg below knee: Secondary | ICD-10-CM | POA: Diagnosis not present

## 2019-01-20 DIAGNOSIS — M1612 Unilateral primary osteoarthritis, left hip: Secondary | ICD-10-CM | POA: Diagnosis not present

## 2019-01-20 NOTE — Telephone Encounter (Signed)
Copied from [place] [id number]. Topic: General - Other >> Jan 20, 2019 12:41 PM Pauline Good wrote: Reason for CRM: pt is being discharge from them and they want pt to proceed with outpatient sevices to [facility] fax # [phone]

## 2019-01-21 NOTE — Telephone Encounter (Signed)
OK to refer as requested

## 2019-01-22 ENCOUNTER — Telehealth: Payer: Self-pay

## 2019-01-22 NOTE — Telephone Encounter (Signed)
Incoming fax request for omeprazole 40 mg one a day, is patient to continue omeprazole? Look like she was only instructed for a 60 days supply.

## 2019-01-23 DIAGNOSIS — K255 Chronic or unspecified gastric ulcer with perforation: Secondary | ICD-10-CM | POA: Diagnosis not present

## 2019-01-23 DIAGNOSIS — K668 Other specified disorders of peritoneum: Secondary | ICD-10-CM | POA: Diagnosis not present

## 2019-01-23 DIAGNOSIS — Z4781 Encounter for orthopedic aftercare following surgical amputation: Secondary | ICD-10-CM | POA: Diagnosis not present

## 2019-01-23 DIAGNOSIS — Z89511 Acquired absence of right leg below knee: Secondary | ICD-10-CM | POA: Diagnosis not present

## 2019-01-23 DIAGNOSIS — M21372 Foot drop, left foot: Secondary | ICD-10-CM | POA: Diagnosis not present

## 2019-01-23 DIAGNOSIS — M1612 Unilateral primary osteoarthritis, left hip: Secondary | ICD-10-CM | POA: Diagnosis not present

## 2019-01-23 NOTE — Telephone Encounter (Signed)
Natasha Frederick called and is requesting to have this fax sent to [facility]. Please advise.

## 2019-01-26 ENCOUNTER — Encounter: Payer: Self-pay | Admitting: Family Medicine

## 2019-01-26 DIAGNOSIS — S88111A Complete traumatic amputation at level between knee and ankle, right lower leg, initial encounter: Secondary | ICD-10-CM | POA: Insufficient documentation

## 2019-01-26 DIAGNOSIS — M25561 Pain in right knee: Secondary | ICD-10-CM | POA: Diagnosis not present

## 2019-01-26 NOTE — Telephone Encounter (Signed)
Referral placed.

## 2019-01-26 NOTE — Telephone Encounter (Signed)
I would use ICD-10 S88.111A (right below knee amputation)

## 2019-01-27 NOTE — Telephone Encounter (Signed)
She has completed the course of omeprazole and does not need a refill.  Thanks for checking.

## 2019-01-27 NOTE — Telephone Encounter (Signed)
Noted  

## 2019-01-28 DIAGNOSIS — R260 Ataxic gait: Secondary | ICD-10-CM | POA: Diagnosis not present

## 2019-01-28 DIAGNOSIS — R262 Difficulty in walking, not elsewhere classified: Secondary | ICD-10-CM | POA: Diagnosis not present

## 2019-02-02 DIAGNOSIS — R260 Ataxic gait: Secondary | ICD-10-CM | POA: Diagnosis not present

## 2019-02-02 DIAGNOSIS — R262 Difficulty in walking, not elsewhere classified: Secondary | ICD-10-CM | POA: Diagnosis not present

## 2019-02-04 DIAGNOSIS — R262 Difficulty in walking, not elsewhere classified: Secondary | ICD-10-CM | POA: Diagnosis not present

## 2019-02-04 DIAGNOSIS — R260 Ataxic gait: Secondary | ICD-10-CM | POA: Diagnosis not present

## 2019-02-05 ENCOUNTER — Telehealth: Payer: Self-pay

## 2019-02-05 NOTE — Telephone Encounter (Signed)
Spoke with Neoma Laming in office and she states she will re-fax referral with office note.

## 2019-02-05 NOTE — Telephone Encounter (Signed)
Copied from [place] [id number]. Topic: General - Other >> Feb 05, 2019 12:41 PM Keene Breath wrote: Reason for CRM: Called to request some medical records on patient to get her started with care.  They said they only got orders.  Please call to discuss at [phone].  Records can be faxed to [phone]

## 2019-02-09 DIAGNOSIS — R260 Ataxic gait: Secondary | ICD-10-CM | POA: Diagnosis not present

## 2019-02-09 DIAGNOSIS — R262 Difficulty in walking, not elsewhere classified: Secondary | ICD-10-CM | POA: Diagnosis not present

## 2019-02-11 DIAGNOSIS — R260 Ataxic gait: Secondary | ICD-10-CM | POA: Diagnosis not present

## 2019-02-11 DIAGNOSIS — R262 Difficulty in walking, not elsewhere classified: Secondary | ICD-10-CM | POA: Diagnosis not present

## 2019-02-16 DIAGNOSIS — R262 Difficulty in walking, not elsewhere classified: Secondary | ICD-10-CM | POA: Diagnosis not present

## 2019-02-16 DIAGNOSIS — R260 Ataxic gait: Secondary | ICD-10-CM | POA: Diagnosis not present

## 2019-02-18 DIAGNOSIS — R260 Ataxic gait: Secondary | ICD-10-CM | POA: Diagnosis not present

## 2019-02-18 DIAGNOSIS — R262 Difficulty in walking, not elsewhere classified: Secondary | ICD-10-CM | POA: Diagnosis not present

## 2019-02-23 DIAGNOSIS — R260 Ataxic gait: Secondary | ICD-10-CM | POA: Diagnosis not present

## 2019-02-23 DIAGNOSIS — R262 Difficulty in walking, not elsewhere classified: Secondary | ICD-10-CM | POA: Diagnosis not present

## 2019-02-25 DIAGNOSIS — R260 Ataxic gait: Secondary | ICD-10-CM | POA: Diagnosis not present

## 2019-02-25 DIAGNOSIS — R262 Difficulty in walking, not elsewhere classified: Secondary | ICD-10-CM | POA: Diagnosis not present

## 2019-02-27 DIAGNOSIS — R262 Difficulty in walking, not elsewhere classified: Secondary | ICD-10-CM | POA: Diagnosis not present

## 2019-02-27 DIAGNOSIS — R260 Ataxic gait: Secondary | ICD-10-CM | POA: Diagnosis not present

## 2019-03-02 ENCOUNTER — Telehealth: Payer: Self-pay

## 2019-03-02 DIAGNOSIS — R260 Ataxic gait: Secondary | ICD-10-CM | POA: Diagnosis not present

## 2019-03-02 DIAGNOSIS — R262 Difficulty in walking, not elsewhere classified: Secondary | ICD-10-CM | POA: Diagnosis not present

## 2019-03-02 NOTE — Telephone Encounter (Signed)
Copied from [place] [id number]. Topic: Quick Communication - Rx Refill/Question >> Mar 02, 2019 10:01 AM Carolyn Stare wrote: Medication oxyCODONE-acetaminophen (PERCOCET/ROXICET) 5-325 MG tablet  Preferred Pharmacy [facility]  Agent: Please be advised that RX refills may take up to 3 business days. We ask that you follow-up with your pharmacy.

## 2019-03-03 MED ORDER — OXYCODONE-ACETAMINOPHEN 5-325 MG PO TABS: 1.0000 | ORAL_TABLET | Freq: Three times a day (TID) | ORAL | 0 refills | Status: DC | PRN

## 2019-03-04 DIAGNOSIS — R262 Difficulty in walking, not elsewhere classified: Secondary | ICD-10-CM | POA: Diagnosis not present

## 2019-03-04 DIAGNOSIS — R260 Ataxic gait: Secondary | ICD-10-CM | POA: Diagnosis not present

## 2019-03-09 DIAGNOSIS — R262 Difficulty in walking, not elsewhere classified: Secondary | ICD-10-CM | POA: Diagnosis not present

## 2019-03-09 DIAGNOSIS — R260 Ataxic gait: Secondary | ICD-10-CM | POA: Diagnosis not present

## 2019-03-11 DIAGNOSIS — R262 Difficulty in walking, not elsewhere classified: Secondary | ICD-10-CM | POA: Diagnosis not present

## 2019-03-11 DIAGNOSIS — R260 Ataxic gait: Secondary | ICD-10-CM | POA: Diagnosis not present

## 2019-03-16 DIAGNOSIS — R260 Ataxic gait: Secondary | ICD-10-CM | POA: Diagnosis not present

## 2019-03-16 DIAGNOSIS — R262 Difficulty in walking, not elsewhere classified: Secondary | ICD-10-CM | POA: Diagnosis not present

## 2019-03-18 DIAGNOSIS — R260 Ataxic gait: Secondary | ICD-10-CM | POA: Diagnosis not present

## 2019-03-18 DIAGNOSIS — R262 Difficulty in walking, not elsewhere classified: Secondary | ICD-10-CM | POA: Diagnosis not present

## 2019-04-13 DIAGNOSIS — R260 Ataxic gait: Secondary | ICD-10-CM | POA: Diagnosis not present

## 2019-04-13 DIAGNOSIS — R262 Difficulty in walking, not elsewhere classified: Secondary | ICD-10-CM | POA: Diagnosis not present

## 2019-04-15 DIAGNOSIS — R262 Difficulty in walking, not elsewhere classified: Secondary | ICD-10-CM | POA: Diagnosis not present

## 2019-04-15 DIAGNOSIS — R260 Ataxic gait: Secondary | ICD-10-CM | POA: Diagnosis not present

## 2019-04-18 ENCOUNTER — Other Ambulatory Visit: Payer: Self-pay | Admitting: Family Medicine

## 2019-04-20 DIAGNOSIS — R260 Ataxic gait: Secondary | ICD-10-CM | POA: Diagnosis not present

## 2019-04-20 DIAGNOSIS — R262 Difficulty in walking, not elsewhere classified: Secondary | ICD-10-CM | POA: Diagnosis not present

## 2019-04-21 DIAGNOSIS — G10 Huntington's disease: Secondary | ICD-10-CM | POA: Diagnosis not present

## 2019-04-21 DIAGNOSIS — Z79899 Other long term (current) drug therapy: Secondary | ICD-10-CM | POA: Diagnosis not present

## 2019-04-21 DIAGNOSIS — F329 Major depressive disorder, single episode, unspecified: Secondary | ICD-10-CM | POA: Diagnosis not present

## 2019-04-28 DIAGNOSIS — R262 Difficulty in walking, not elsewhere classified: Secondary | ICD-10-CM | POA: Diagnosis not present

## 2019-04-28 DIAGNOSIS — R260 Ataxic gait: Secondary | ICD-10-CM | POA: Diagnosis not present

## 2019-04-29 ENCOUNTER — Telehealth: Payer: Self-pay | Admitting: *Deleted

## 2019-04-29 ENCOUNTER — Telehealth: Payer: Self-pay | Admitting: Family Medicine

## 2019-04-29 ENCOUNTER — Other Ambulatory Visit: Payer: Self-pay

## 2019-04-29 ENCOUNTER — Telehealth (INDEPENDENT_AMBULATORY_CARE_PROVIDER_SITE_OTHER): Payer: Medicare Other | Admitting: Family Medicine

## 2019-04-29 DIAGNOSIS — F329 Major depressive disorder, single episode, unspecified: Secondary | ICD-10-CM

## 2019-04-29 DIAGNOSIS — F419 Anxiety disorder, unspecified: Secondary | ICD-10-CM | POA: Diagnosis not present

## 2019-04-29 DIAGNOSIS — G10 Huntington's disease: Secondary | ICD-10-CM

## 2019-04-29 DIAGNOSIS — F32A Depression, unspecified: Secondary | ICD-10-CM

## 2019-04-29 MED ORDER — SERTRALINE HCL 100 MG PO TABS: 100.0000 mg | ORAL_TABLET | Freq: Every day | ORAL | 3 refills | Status: DC

## 2019-04-29 NOTE — Progress Notes (Signed)
This visit type was conducted due to national recommendations for restrictions regarding the COVID-19 pandemic in an effort to limit this patient's exposure and mitigate transmission in our community.   Virtual Visit via Telephone Note  I connected with Natasha Frederick on 04/29/19 at  5:30 PM EST by telephone and verified that I am speaking with the correct person using two identifiers.   I discussed the limitations, risks, security and privacy concerns of performing an evaluation and management service by telephone and the availability of in person appointments. I also discussed with the patient that there may be a patient responsible charge related to this service. The patient expressed understanding and agreed to proceed.  Location patient: home Location provider: work or home office Participants present for the call: patient, provider Patient did not have a visit in the prior 7 days to address this/these issue(s).   History of Present Illness:  Natasha Frederick had called earlier today with questions about increasing sertraline.  We have started this several months ago after her recent hospitalizations.  She has Huntington's disease and has had progressive disability related to that and then had right below-knee amputation secondary to acute vascular compromise during admission several months ago.  She has had difficulties adapting to that.  She has had increased depressive symptoms.  We started sertraline 50 mg daily and she initially saw some good improvements with that.  She has had some recent increased depressed mood.  No acute suicidal ideation.  She spoke with her neurologist over at [facility] and they had suggested increasing her sertraline to 100 mg daily.  We concur.  She is not on any other serotonin medications  Past Medical History:  Diagnosis Date  . Acute embolism and thrombosis of deep vein of both distal lower extremities (HCC)   . Arthritis    hip  . C. difficile diarrhea   .  Endometriosis   . Huntington disease ([place])   . Peptic ulcer   . Perforated gastric ulcer ([place])   . Scoliosis    Past Surgical History:  Procedure Laterality Date  . AMPUTATION Right 08/08/2018   Procedure: AMPUTATION BELOW KNEE RIGHT LOWER EXTREMITY;  Surgeon: Rosetta Posner, MD;  Location: [facility];  Service: Vascular;  Laterality: Right;  . ANTERIOR LAT LUMBAR FUSION Left 01/05/2014   Procedure: LUMBAR TWO TO THREE, LUMBAR LUMBAR THREE TO FOUR, ANTERIOR LATERAL LUMBAR FUSION 2 LEVELS;  Surgeon: Faythe Ghee, MD;  [facility];  Service: Neurosurgery;  Laterality: Left;  . APPENDECTOMY  1963  . BIOPSY  11/25/2018   Procedure: BIOPSY;  Surgeon: Doran Stabler, MD;  [facility];  Service: Gastroenterology;;  . BREAST SURGERY Left 1987   bx  . COLONOSCOPY W/ POLYPECTOMY    . ESOPHAGOGASTRODUODENOSCOPY (EGD) WITH PROPOFOL N/A 11/25/2018   Procedure: ESOPHAGOGASTRODUODENOSCOPY (EGD) WITH PROPOFOL;  Surgeon: Doran Stabler, MD;  [facility];  Service: Gastroenterology;  Laterality: N/A;  . EYE SURGERY Bilateral   . FEMORAL-POPLITEAL BYPASS GRAFT Bilateral 08/06/2018   Procedure: BILATERAL POPLITEAL AND TIBIAL EMBOLECTOMIES, LEFT LEG FASCIOTOMY;  Surgeon: Rosetta Posner, MD;  Location: [place];  Service: Vascular;  Laterality: Bilateral;  . LAPAROSCOPY N/A 07/30/2018   Procedure: DIAGNOSTIC LAPAROSCOPY, Omentopexy gram patch, Washout of intra-abdominal abcesses x 3;  Surgeon: Michael Boston, MD;  [facility];  Service: General;  Laterality: N/A;  . OOPHORECTOMY Left 1977  . Planters wart Left 2005  . radial keratoto Bilateral   . TONSILLECTOMY  1958  . TOTAL HIP ARTHROPLASTY Left 07/19/2014   Procedure: TOTAL HIP ARTHROPLASTY;  Surgeon: Gean Birchwood, MD;  [facility];  Service: Orthopedics;  Laterality: Left;  . TUBAL LIGATION  1986    reports that she quit smoking about 25 years ago. Her smoking use included cigarettes. She has a 8.00 pack-year smoking  history. She has never used smokeless tobacco. She reports current alcohol use of about 3.0 standard drinks of alcohol per week. She reports that she does not use drugs. family history includes Huntington's disease in her paternal aunt, paternal grandmother, and paternal uncle; Lung cancer in her mother; Stroke in her father. Allergies  Allergen Reactions  . Bee Venom Anaphylaxis and Swelling  . Nsaids Other (See Comments)    PERFORATED ULCER = NO MORE NSAIDs  . Adhesive [Tape] Other (See Comments)    redness  . Codeine Nausea And Vomiting  . Penicillins     Did it involve swelling of the face/tongue/throat, SOB, or low BP? Yes Did it involve sudden or severe rash/hives, skin peeling, or any reaction on the inside of your mouth or nose? Unknown Did you need to seek medical attention at a hospital or doctor's office? Yes When did it last happen?age 56-40 If all above answers are "NO", may proceed with cephalosporin use.   . Sulfa Antibiotics Itching      Observations/Objective: Patient sounds cheerful and well on the phone. I do not appreciate any SOB. Speech and thought processing are grossly intact. Patient reported vitals:  Assessment and Plan:   Increased anxiety and depression symptoms.  She is having difficulty coping and adjusting following recent health events  -Agree with titration of sertraline 100 mg and a new prescription will be sent in.  She is aware this may take a few weeks to see full effect   Follow Up Instructions:  -be in touch if not improved in 3-4 weeks   [id number] 5-10 [id number] 11-20 [id number] 21-30 I did not refer this patient for an OV in the next 24 hours for this/these issue(s).  I discussed the assessment and treatment plan with the patient. The patient was provided an opportunity to ask questions and all were answered. The patient agreed with the plan and demonstrated an understanding of the instructions.   The patient was advised to call back or  seek an in-person evaluation if the symptoms worsen or if the condition fails to improve as anticipated.  I provided 23 minutes of non-face-to-face time during this encounter.   Evelena Peat, MD

## 2019-04-29 NOTE — Telephone Encounter (Signed)
Please see message. Do you want me to set up a Doxy? Patient did not schedule follow up from visit on 11/05/18 for this question.

## 2019-04-29 NOTE — Telephone Encounter (Signed)
Called patient and spoke to her husband and set up a telephone visit for today at 5:30pm. Husband verbalized an understanding.

## 2019-04-29 NOTE — Telephone Encounter (Signed)
yes

## 2019-04-29 NOTE — Telephone Encounter (Signed)
Patient's husband states that pt's neurologist wants her Sertraline changed to 100 mg.  [name] is the neurologist and she was supposed to touch base with Dr. Caryl Never.

## 2019-04-29 NOTE — Telephone Encounter (Signed)
Copied from CRM [id number]. Topic: General - Inquiry >> Apr 29, 2019  3:39 PM Floria Raveling A wrote: Reason for CRM: Melody from [facility] has some questions about pt meds.  She would like a call back at [phone] Ask for Melody

## 2019-04-30 DIAGNOSIS — R260 Ataxic gait: Secondary | ICD-10-CM | POA: Diagnosis not present

## 2019-04-30 DIAGNOSIS — R262 Difficulty in walking, not elsewhere classified: Secondary | ICD-10-CM | POA: Diagnosis not present

## 2019-05-01 ENCOUNTER — Telehealth: Payer: Self-pay | Admitting: Family Medicine

## 2019-05-01 NOTE — Telephone Encounter (Signed)
Pt spouse, Delayna Sparlin, called in regarding refilling her prescription for Oxycodone. The pharmacy ([facility] in [place] [phone]) is needing an authorization to refill. Pt can be contacted at [phone]

## 2019-05-04 DIAGNOSIS — R262 Difficulty in walking, not elsewhere classified: Secondary | ICD-10-CM | POA: Diagnosis not present

## 2019-05-04 DIAGNOSIS — R260 Ataxic gait: Secondary | ICD-10-CM | POA: Diagnosis not present

## 2019-05-04 MED ORDER — OXYCODONE-ACETAMINOPHEN 5-325 MG PO TABS: 1.0000 | ORAL_TABLET | Freq: Three times a day (TID) | ORAL | 0 refills | Status: DC | PRN

## 2019-05-04 NOTE — Telephone Encounter (Signed)
Please see message. Not sure if they need just a refill or a PA.

## 2019-05-06 DIAGNOSIS — R262 Difficulty in walking, not elsewhere classified: Secondary | ICD-10-CM | POA: Diagnosis not present

## 2019-05-06 DIAGNOSIS — R260 Ataxic gait: Secondary | ICD-10-CM | POA: Diagnosis not present

## 2019-05-07 ENCOUNTER — Telehealth: Payer: Self-pay | Admitting: Family Medicine

## 2019-05-07 NOTE — Telephone Encounter (Signed)
Pt is requesting authorization for   oxyCODONE-acetaminophen (PERCOCET/ROXICET) 5-325 MG tablet .

## 2019-05-08 ENCOUNTER — Telehealth: Payer: Self-pay | Admitting: Family Medicine

## 2019-05-08 DIAGNOSIS — R260 Ataxic gait: Secondary | ICD-10-CM | POA: Diagnosis not present

## 2019-05-08 DIAGNOSIS — R262 Difficulty in walking, not elsewhere classified: Secondary | ICD-10-CM | POA: Diagnosis not present

## 2019-05-08 NOTE — Telephone Encounter (Signed)
Yes.  This is in regards to her [place] (Huntington's disease medication).  She would need to come in for office visit early next week for that.  15-minute visit should be okay

## 2019-05-08 NOTE — Telephone Encounter (Signed)
Natasha Frederick, a nurse  from [name], wanted to know if Dr. Caryl Never  can order an EKG for the pt because  Dr. [name] recommended it because one of her medication   increased; please call [name] at [phone]

## 2019-05-08 NOTE — Telephone Encounter (Signed)
Called patient and LMOVM to return call  Ok for [facility] to Discuss results / PCP / recommendations / Schedule patient  Left a detailed voice message to let patient know that I am going to assume she needs a prior authorization from her insurance company for this medication.

## 2019-05-08 NOTE — Telephone Encounter (Signed)
Please see message.  Please advise. 

## 2019-05-11 DIAGNOSIS — R260 Ataxic gait: Secondary | ICD-10-CM | POA: Diagnosis not present

## 2019-05-11 DIAGNOSIS — R262 Difficulty in walking, not elsewhere classified: Secondary | ICD-10-CM | POA: Diagnosis not present

## 2019-05-11 NOTE — Telephone Encounter (Signed)
I have scheduled this appointment for patient for 05/12/19 at 1:45pm. Husband verbalized an understanding.

## 2019-05-12 ENCOUNTER — Ambulatory Visit (INDEPENDENT_AMBULATORY_CARE_PROVIDER_SITE_OTHER): Payer: Medicare Other | Admitting: Family Medicine

## 2019-05-12 ENCOUNTER — Encounter: Payer: Self-pay | Admitting: Family Medicine

## 2019-05-12 ENCOUNTER — Other Ambulatory Visit: Payer: Self-pay

## 2019-05-12 VITALS — BP 118/66 | HR 83 | Temp 97.6°F

## 2019-05-12 DIAGNOSIS — M545 Other chronic pain: Secondary | ICD-10-CM

## 2019-05-12 DIAGNOSIS — F339 Major depressive disorder, recurrent, unspecified: Secondary | ICD-10-CM | POA: Diagnosis not present

## 2019-05-12 DIAGNOSIS — K5903 Drug induced constipation: Secondary | ICD-10-CM

## 2019-05-12 DIAGNOSIS — G10 Huntington's disease: Secondary | ICD-10-CM | POA: Diagnosis not present

## 2019-05-12 DIAGNOSIS — Z79899 Other long term (current) drug therapy: Secondary | ICD-10-CM | POA: Diagnosis not present

## 2019-05-12 DIAGNOSIS — T402X5A Adverse effect of other opioids, initial encounter: Secondary | ICD-10-CM | POA: Diagnosis not present

## 2019-05-12 DIAGNOSIS — G8929 Other chronic pain: Secondary | ICD-10-CM | POA: Diagnosis not present

## 2019-05-12 NOTE — Progress Notes (Signed)
Subjective:     Patient ID: Natasha Frederick, female   DOB: 1956/02/01, 64 y.o.   MRN: [id number]  HPI   Natasha Frederick has Huntington's disease.  She is followed over at [facility] through their neurology department currently and we had received a call that she needs an EKG to monitor for QT prolongation which can be seen with [facility].  She currently takes dosage of 24 mg twice daily  She has history of depression which is currently stable on sertraline 100 mg daily.  This was titrated recently and she thinks this has helped some.  She has chronic back pain and has been taking over-the-counter ibuprofen prior to her admission last year for perforated ulcer.  She now takes oxycodone 5 mg nightly.  That seems to be controlling her pain fairly well.  She does have some intermittent constipation and takes senna for that which seems to help  Past Medical History:  Diagnosis Date  . Acute embolism and thrombosis of deep vein of both distal lower extremities (HCC)   . Arthritis    hip  . C. difficile diarrhea   . Endometriosis   . Huntington disease (Natasha Frederick)   . Peptic ulcer   . Perforated gastric ulcer ([place])   . Scoliosis    Past Surgical History:  Procedure Laterality Date  . AMPUTATION Right 08/08/2018   Procedure: AMPUTATION BELOW KNEE RIGHT LOWER EXTREMITY;  Surgeon: Rosetta Posner, MD;  Location: [facility];  Service: Vascular;  Laterality: Right;  . ANTERIOR LAT LUMBAR FUSION Left 01/05/2014   Procedure: LUMBAR TWO TO THREE, LUMBAR LUMBAR THREE TO FOUR, ANTERIOR LATERAL LUMBAR FUSION 2 LEVELS;  Surgeon: Faythe Ghee, MD;  [facility];  Service: Neurosurgery;  Laterality: Left;  . APPENDECTOMY  1963  . BIOPSY  11/25/2018   Procedure: BIOPSY;  Surgeon: Doran Stabler, MD;  [facility]: [facility];  Service: Gastroenterology;;  . BREAST SURGERY Left 1987   bx  . COLONOSCOPY W/ POLYPECTOMY    . ESOPHAGOGASTRODUODENOSCOPY (EGD) WITH PROPOFOL N/A 11/25/2018   Procedure:  ESOPHAGOGASTRODUODENOSCOPY (EGD) WITH PROPOFOL;  Surgeon: Doran Stabler, MD;  [facility];  Service: Gastroenterology;  Laterality: N/A;  . EYE SURGERY Bilateral   . FEMORAL-POPLITEAL BYPASS GRAFT Bilateral 08/06/2018   Procedure: BILATERAL POPLITEAL AND TIBIAL EMBOLECTOMIES, LEFT LEG FASCIOTOMY;  Surgeon: Rosetta Posner, MD;  Location: [facility];  Service: Vascular;  Laterality: Bilateral;  . LAPAROSCOPY N/A 07/30/2018   Procedure: DIAGNOSTIC LAPAROSCOPY, Omentopexy gram patch, Washout of intra-abdominal abcesses x 3;  Surgeon: Michael Boston, MD;  [facility];  Service: General;  Laterality: N/A;  . OOPHORECTOMY Left 1977  . Planters wart Left 2005  . radial keratoto Bilateral   . TONSILLECTOMY  1958  . TOTAL HIP ARTHROPLASTY Left 07/19/2014   Procedure: TOTAL HIP ARTHROPLASTY;  Surgeon: Frederik Pear, MD;  Location: [facility];  Service: Orthopedics;  Laterality: Left;  . TUBAL LIGATION  1986    reports that she quit smoking about 25 years ago. Her smoking use included cigarettes. She has a 8.00 pack-year smoking history. She has never used smokeless tobacco. She reports current alcohol use of about 3.0 standard drinks of alcohol per week. She reports that she does not use drugs. family history includes Huntington's disease in her paternal aunt, paternal grandmother, and paternal uncle; Lung cancer in her mother; Stroke in her father. Allergies  Allergen Reactions  . Bee Venom Anaphylaxis and Swelling  . Nsaids Other (See Comments)  PERFORATED ULCER = NO MORE NSAIDs  . Adhesive [Tape] Other (See Comments)    redness  . Codeine Nausea And Vomiting  . Penicillins     Did it involve swelling of the face/tongue/throat, SOB, or low BP? Yes Did it involve sudden or severe rash/hives, skin peeling, or any reaction on the inside of your mouth or nose? Unknown Did you need to seek medical attention at a hospital or doctor's office? Yes When did it last happen?age 60-40 If all  above answers are "NO", may proceed with cephalosporin use.   . Sulfa Antibiotics Itching     Review of Systems  Constitutional: Negative for appetite change, chills and fever.  Respiratory: Negative for cough and shortness of breath.   Cardiovascular: Negative for chest pain and palpitations.  Gastrointestinal: Negative for abdominal pain.  Genitourinary: Negative for dysuria.  Neurological: Negative for dizziness and syncope.  Psychiatric/Behavioral: Negative for agitation and suicidal ideas.       Objective:   Physical Exam Vitals reviewed.  Constitutional:      Appearance: Normal appearance.  Cardiovascular:     Rate and Rhythm: Normal rate and regular rhythm.  Pulmonary:     Effort: Pulmonary effort is normal.     Breath sounds: Normal breath sounds.  Musculoskeletal:     Comments: She had previous right below-knee amputation.  No left lower extremity edema.  Neurological:     Mental Status: She is alert.        Assessment:     #1 Huntington's disease.  She is currently treated with [facility] and needs EKG to monitor for QT prolongation  #2 chronic low back pain with history of previous lumbar fusion.  Taking low-dose Percocet at night.  Previous perforated gastric ulcer on over-the-counter nonsteroidal  #3 recurrent depression currently stable on sertraline  #4 intermittent constipation related to chronic opioid use currently controlled with senna    Plan:     -Obtain EKG-sinus rhythm. No evidence for QT prolongation.  -Continue sertraline 100 mg daily  -Continue measures to reduce constipation risk including plenty of fluids and fiber and stool softener as needed  Kristian Covey MD [facility] at [facility]

## 2019-05-13 DIAGNOSIS — R262 Difficulty in walking, not elsewhere classified: Secondary | ICD-10-CM | POA: Diagnosis not present

## 2019-05-13 DIAGNOSIS — R260 Ataxic gait: Secondary | ICD-10-CM | POA: Diagnosis not present

## 2019-05-13 NOTE — Telephone Encounter (Signed)
Called patient and spoke to Mr. Bar and he stated that the Medicare is primary and the [employer] is secondary. Husband stated that they were able to pick up the prescription and it did not need a PA.

## 2019-05-18 ENCOUNTER — Other Ambulatory Visit: Payer: Self-pay

## 2019-05-18 DIAGNOSIS — R262 Difficulty in walking, not elsewhere classified: Secondary | ICD-10-CM | POA: Diagnosis not present

## 2019-05-18 DIAGNOSIS — R260 Ataxic gait: Secondary | ICD-10-CM | POA: Diagnosis not present

## 2019-05-19 ENCOUNTER — Ambulatory Visit (INDEPENDENT_AMBULATORY_CARE_PROVIDER_SITE_OTHER): Payer: Medicare Other | Admitting: Family Medicine

## 2019-05-19 ENCOUNTER — Encounter: Payer: Self-pay | Admitting: Family Medicine

## 2019-05-19 ENCOUNTER — Other Ambulatory Visit: Payer: Self-pay

## 2019-05-19 VITALS — BP 118/68 | HR 91 | Temp 98.0°F

## 2019-05-19 DIAGNOSIS — L89329 Pressure ulcer of left buttock, unspecified stage: Secondary | ICD-10-CM

## 2019-05-19 NOTE — Progress Notes (Signed)
Subjective:     Patient ID: Natasha Frederick, female   DOB: [birth year], 64 y.o.   MRN: [id number]  HPI Natasha Frederick had called yesterday with painful "bump left buttock region.  She denies any specific injury.  She has some soreness with sitting.  No drainage.  She first noticed this area a few days ago.  She started using over-the-counter antibiotic topically.  She was concerned this may be either a cyst or abscess.  Symptoms are better today.  No recent fever or chills.  Past Medical History:  Diagnosis Date  . Acute embolism and thrombosis of deep vein of both distal lower extremities (HCC)   . Arthritis    hip  . C. difficile diarrhea   . Endometriosis   . Huntington disease (HCC)   . Peptic ulcer   . Perforated gastric ulcer (HCC)   . Scoliosis    Past Surgical History:  Procedure Laterality Date  . AMPUTATION Right 08/08/2018   Procedure: AMPUTATION BELOW KNEE RIGHT LOWER EXTREMITY;  Surgeon: Larina Earthly, MD;  [facility];  Service: Vascular;  Laterality: Right;  . ANTERIOR LAT LUMBAR FUSION Left 01/05/2014   Procedure: LUMBAR TWO TO THREE, LUMBAR LUMBAR THREE TO FOUR, ANTERIOR LATERAL LUMBAR FUSION 2 LEVELS;  Surgeon: Reinaldo Meeker, MD;  [facility];  Service: Neurosurgery;  Laterality: Left;  . APPENDECTOMY  1963  . BIOPSY  11/25/2018   Procedure: BIOPSY;  Surgeon: Sherrilyn Rist, MD;  [facility];  Service: Gastroenterology;;  . BREAST SURGERY Left 1987   bx  . COLONOSCOPY W/ POLYPECTOMY    . ESOPHAGOGASTRODUODENOSCOPY (EGD) WITH PROPOFOL N/A 11/25/2018   Procedure: ESOPHAGOGASTRODUODENOSCOPY (EGD) WITH PROPOFOL;  Surgeon: Sherrilyn Rist, MD;  [facility];  Service: Gastroenterology;  Laterality: N/A;  . EYE SURGERY Bilateral   . FEMORAL-POPLITEAL BYPASS GRAFT Bilateral 08/06/2018   Procedure: BILATERAL POPLITEAL AND TIBIAL EMBOLECTOMIES, LEFT LEG FASCIOTOMY;  Surgeon: Larina Earthly, MD;  [facility];  Service: Vascular;  Laterality:  Bilateral;  . LAPAROSCOPY N/A 07/30/2018   Procedure: DIAGNOSTIC LAPAROSCOPY, Omentopexy gram patch, Washout of intra-abdominal abcesses x 3;  Surgeon: Karie Soda, MD;  [facility];  Service: General;  Laterality: N/A;  . OOPHORECTOMY Left 1977  . Planters wart Left 2005  . radial keratoto Bilateral   . TONSILLECTOMY  1958  . TOTAL HIP ARTHROPLASTY Left 07/19/2014   Procedure: TOTAL HIP ARTHROPLASTY;  Surgeon: Gean Birchwood, MD;  [facility];  Service: Orthopedics;  Laterality: Left;  . TUBAL LIGATION  1986    reports that she quit smoking about 25 years ago. Her smoking use included cigarettes. She has a 8.00 pack-year smoking history. She has never used smokeless tobacco. She reports current alcohol use of about 3.0 standard drinks of alcohol per week. She reports that she does not use drugs. family history includes Huntington's disease in her paternal aunt, paternal grandmother, and paternal uncle; Lung cancer in her mother; Stroke in her father. Allergies  Allergen Reactions  . Bee Venom Anaphylaxis and Swelling  . Nsaids Other (See Comments)    PERFORATED ULCER = NO MORE NSAIDs  . Adhesive [Tape] Other (See Comments)    redness  . Codeine Nausea And Vomiting  . Penicillins     Did it involve swelling of the face/tongue/throat, SOB, or low BP? Yes Did it involve sudden or severe rash/hives, skin peeling, or any reaction on the inside of your mouth or nose? Unknown Did you need  to seek medical attention at a hospital or doctor's office? Yes When did it last happen?age 46-40 If all above answers are "NO", may proceed with cephalosporin use.   . Sulfa Antibiotics Itching     Review of Systems  Constitutional: Negative for chills and fever.       Objective:   Physical Exam Vitals reviewed.  Constitutional:      Appearance: Normal appearance.  Cardiovascular:     Rate and Rhythm: Normal rate and regular rhythm.  Skin:    Comments: She has very small eschar  left buttock medially down to the junction between the buttock and thigh.  Nontender.  No fluctuance.  No pustules.  No surrounding erythema.  Neurological:     Mental Status: She is alert.        Assessment:     Small ulcer left buttock region which appears to be healing with no signs of secondary infection    Plan:     -Keep area clean with soap and water -Recommend they apply some Vaseline daily to reduce friction.  Also try to change positions frequently to avoid pressure on the area -Follow-up as needed  Eulas Post MD [facility] at [facility]

## 2019-05-19 NOTE — Patient Instructions (Signed)
Your buttock wound appears to be healing  I would use some vaseline on the affected area at least twice daily to reduce friction.

## 2019-05-20 DIAGNOSIS — R262 Difficulty in walking, not elsewhere classified: Secondary | ICD-10-CM | POA: Diagnosis not present

## 2019-05-20 DIAGNOSIS — R260 Ataxic gait: Secondary | ICD-10-CM | POA: Diagnosis not present

## 2019-05-25 ENCOUNTER — Telehealth: Payer: Self-pay | Admitting: Family Medicine

## 2019-05-25 DIAGNOSIS — R260 Ataxic gait: Secondary | ICD-10-CM | POA: Diagnosis not present

## 2019-05-25 DIAGNOSIS — R262 Difficulty in walking, not elsewhere classified: Secondary | ICD-10-CM | POA: Diagnosis not present

## 2019-05-25 NOTE — Telephone Encounter (Signed)
Left message for patient to call back and schedule Medicare Annual Wellness Visit (AWV) either virtually,audio only or in person (whichever the patient prefers--45 MINUTES).  No hx; please schedule at anytime with [facility]-Nurse Health Advisor at [facility] [place].   

## 2019-05-27 ENCOUNTER — Ambulatory Visit (INDEPENDENT_AMBULATORY_CARE_PROVIDER_SITE_OTHER): Payer: Medicare Other

## 2019-05-27 ENCOUNTER — Ambulatory Visit (INDEPENDENT_AMBULATORY_CARE_PROVIDER_SITE_OTHER): Payer: Medicare Other | Admitting: Family Medicine

## 2019-05-27 ENCOUNTER — Encounter: Payer: Self-pay | Admitting: Family Medicine

## 2019-05-27 ENCOUNTER — Other Ambulatory Visit: Payer: Self-pay

## 2019-05-27 VITALS — BP 116/70 | HR 93 | Temp 97.5°F

## 2019-05-27 VITALS — BP 116/70 | HR 93 | Temp 97.5°F | Ht 64.0 in | Wt 135.0 lb

## 2019-05-27 DIAGNOSIS — L84 Corns and callosities: Secondary | ICD-10-CM

## 2019-05-27 DIAGNOSIS — R262 Difficulty in walking, not elsewhere classified: Secondary | ICD-10-CM | POA: Diagnosis not present

## 2019-05-27 DIAGNOSIS — Z0000 Encounter for general adult medical examination without abnormal findings: Secondary | ICD-10-CM

## 2019-05-27 DIAGNOSIS — R260 Ataxic gait: Secondary | ICD-10-CM | POA: Diagnosis not present

## 2019-05-27 DIAGNOSIS — Z13820 Encounter for screening for osteoporosis: Secondary | ICD-10-CM | POA: Diagnosis not present

## 2019-05-27 DIAGNOSIS — Z780 Asymptomatic menopausal state: Secondary | ICD-10-CM

## 2019-05-27 DIAGNOSIS — Z1231 Encounter for screening mammogram for malignant neoplasm of breast: Secondary | ICD-10-CM | POA: Diagnosis not present

## 2019-05-27 NOTE — Patient Instructions (Addendum)
Natasha Frederick , Thank you for taking time to come for your Medicare Wellness Visit. I appreciate your ongoing commitment to your health goals. Please review the following plan we discussed and let me know if I can assist you in the future.   Screening recommendations/referrals: Colorectal Screening: last colonoscopy on file was 01/08/2003. Records requested because patient reports having once since then. Mammogram: order sent to [facility] and they will contact you to schedule.  Bone Density: order sent to [facility] and they will contact you to schedule.   Vision and Dental Exams: Recommended annual ophthalmology exams for early detection of glaucoma and other disorders of the eye Recommended annual dental exams for proper oral hygiene  Diabetic Exams: Diabetic Eye Exam: N/A Diabetic Foot Exam: N/A  Vaccinations: Influenza vaccine: completed 12/19/2018; due again in fall 2021. Pneumococcal vaccine: completed 02/07/2006 & 08/11/2018. Up to date.  Tdap vaccine:completed 10/09/2017. Due 10/10/2027. Shingles vaccine:completed 10/14/2017 & 12/22/2017.   Advanced directives: Advance directives discussed with you today. Please bring a copy of your POA (Power of [place]) and/or Living Will to your next appointment.  Goals: Continue to drink at least 6-8 8oz glasses of water per day.  Recommend to exercise for at least 150 minutes per week.  Recommend to remove any items from the home that may cause slips or trips.  Next appointment: Please schedule your Annual Wellness Visit with your Nurse Health Advisor in one year.  Preventive Care 16 Years and Older, Female Preventive care refers to lifestyle choices and visits with your health care provider that can promote health and wellness. What does preventive care include?  A yearly physical exam. This is also called an annual well check.  Dental exams once or twice a year.  Routine eye exams. Ask your health care provider how  often you should have your eyes checked.  Personal lifestyle choices, including:  Daily care of your teeth and gums.  Regular physical activity.  Eating a healthy diet.  Avoiding tobacco and drug use.  Limiting alcohol use.  Practicing safe sex.  Taking low-dose aspirin every day if recommended by your health care provider.  Taking vitamin and mineral supplements as recommended by your health care provider. What happens during an annual well check? The services and screenings done by your health care provider during your annual well check will depend on your age, overall health, lifestyle risk factors, and family history of disease. Counseling  Your health care provider may ask you questions about your:  Alcohol use.  Tobacco use.  Drug use.  Emotional well-being.  Home and relationship well-being.  Sexual activity.  Eating habits.  History of falls.  Memory and ability to understand (cognition).  Work and work Statistician.  Reproductive health. Screening  You may have the following tests or measurements:  Height, weight, and BMI.  Blood pressure.  Lipid and cholesterol levels. These may be checked every 5 years, or more frequently if you are over 88 years old.  Skin check.  Lung cancer screening. You may have this screening every year starting at age 80 if you have a 30-pack-year history of smoking and currently smoke or have quit within the past 15 years.  Fecal occult blood test (FOBT) of the stool. You may have this test every year starting at age 97.  Flexible sigmoidoscopy or colonoscopy. You may have a sigmoidoscopy every 5 years or a colonoscopy every 10 years starting at age 72.  Hepatitis C blood test.  Hepatitis  B blood test.  Sexually transmitted disease (STD) testing.  Diabetes screening. This is done by checking your blood sugar (glucose) after you have not eaten for a while (fasting). You may have this done every 1-3 years.  Bone  density scan. This is done to screen for osteoporosis. You may have this done starting at age 84.  Mammogram. This may be done every 1-2 years. Talk to your health care provider about how often you should have regular mammograms. Talk with your health care provider about your test results, treatment options, and if necessary, the need for more tests. Vaccines  Your health care provider may recommend certain vaccines, such as:  Influenza vaccine. This is recommended every year.  Tetanus, diphtheria, and acellular pertussis (Tdap, Td) vaccine. You may need a Td booster every 10 years.  Zoster vaccine. You may need this after age 39.  Pneumococcal 13-valent conjugate (PCV13) vaccine. One dose is recommended after age 12.  Pneumococcal polysaccharide (PPSV23) vaccine. One dose is recommended after age 81. Talk to your health care provider about which screenings and vaccines you need and how often you need them. This information is not intended to replace advice given to you by your health care provider. Make sure you discuss any questions you have with your health care provider. Document Released: 04/22/2015 Document Revised: 12/14/2015 Document Reviewed: 01/25/2015 Elsevier Interactive Patient Education  2017 [employer].  Fall Prevention in the Home Falls can cause injuries. They can happen to people of all ages. There are many things you can do to make your home safe and to help prevent falls. What can I do on the outside of my home?  Regularly fix the edges of walkways and driveways and fix any cracks.  Remove anything that might make you trip as you walk through a door, such as a raised step or threshold.  Trim any bushes or trees on the path to your home.  Use bright outdoor lighting.  Clear any walking paths of anything that might make someone trip, such as rocks or tools.  Regularly check to see if handrails are loose or broken. Make sure that both sides of any steps have  handrails.  Any raised decks and porches should have guardrails on the edges.  Have any leaves, snow, or ice cleared regularly.  Use sand or salt on walking paths during winter.  Clean up any spills in your garage right away. This includes oil or grease spills. What can I do in the bathroom?  Use night lights.  Install grab bars by the toilet and in the tub and shower. Do not use towel bars as grab bars.  Use non-skid mats or decals in the tub or shower.  If you need to sit down in the shower, use a plastic, non-slip stool.  Keep the floor dry. Clean up any water that spills on the floor as soon as it happens.  Remove soap buildup in the tub or shower regularly.  Attach bath mats securely with double-sided non-slip rug tape.  Do not have throw rugs and other things on the floor that can make you trip. What can I do in the bedroom?  Use night lights.  Make sure that you have a light by your bed that is easy to reach.  Do not use any sheets or blankets that are too big for your bed. They should not hang down onto the floor.  Have a firm chair that has side arms. You can use this for  support while you get dressed.  Do not have throw rugs and other things on the floor that can make you trip. What can I do in the kitchen?  Clean up any spills right away.  Avoid walking on wet floors.  Keep items that you use a lot in easy-to-reach places.  If you need to reach something above you, use a strong step stool that has a grab bar.  Keep electrical cords out of the way.  Do not use floor polish or wax that makes floors slippery. If you must use wax, use non-skid floor wax.  Do not have throw rugs and other things on the floor that can make you trip. What can I do with my stairs?  Do not leave any items on the stairs.  Make sure that there are handrails on both sides of the stairs and use them. Fix handrails that are broken or loose. Make sure that handrails are as long as  the stairways.  Check any carpeting to make sure that it is firmly attached to the stairs. Fix any carpet that is loose or worn.  Avoid having throw rugs at the top or bottom of the stairs. If you do have throw rugs, attach them to the floor with carpet tape.  Make sure that you have a light switch at the top of the stairs and the bottom of the stairs. If you do not have them, ask someone to add them for you. What else can I do to help prevent falls?  Wear shoes that:  Do not have high heels.  Have rubber bottoms.  Are comfortable and fit you well.  Are closed at the toe. Do not wear sandals.  If you use a stepladder:  Make sure that it is fully opened. Do not climb a closed stepladder.  Make sure that both sides of the stepladder are locked into place.  Ask someone to hold it for you, if possible.  Clearly mark and make sure that you can see:  Any grab bars or handrails.  First and last steps.  Where the edge of each step is.  Use tools that help you move around (mobility aids) if they are needed. These include:  Canes.  Walkers.  Scooters.  Crutches.  Turn on the lights when you go into a dark area. Replace any light bulbs as soon as they burn out.  Set up your furniture so you have a clear path. Avoid moving your furniture around.  If any of your floors are uneven, fix them.  If there are any pets around you, be aware of where they are.  Review your medicines with your doctor. Some medicines can make you feel dizzy. This can increase your chance of falling. Ask your doctor what other things that you can do to help prevent falls. This information is not intended to replace advice given to you by your health care provider. Make sure you discuss any questions you have with your health care provider. Document Released: 01/20/2009 Document Revised: 09/01/2015 Document Reviewed: 04/30/2014 Elsevier Interactive Patient Education  2017 [employer].

## 2019-05-27 NOTE — Progress Notes (Signed)
Subjective:   Natasha Frederick is a 64 y.o. female who presents for an Initial Medicare Annual Wellness Visit.  Natasha Frederick is currently doing outpatient physical therapy 3 days a week. Her husband cooks 3 meals daily. She states she eats plenty of vegetables but doesn't eat a lot of fresh fruit. She does drink lots of water due to having dry mouth related to medications. She has a right BKA and is able to ambulate with a walker at home. She is in a wheelchair today in the office.   Review of Systems    No ROS; Annual Medicare Wellness Visit  Cardiac Risk Factors include: advanced age (>2men, >16 women);sedentary lifestyle     Objective:    Today's Vitals   05/27/19 1119  BP: 116/70  Pulse: 93  Temp: (!) 97.5 F (36.4 C)  TempSrc: Temporal  Weight: 135 lb (61.2 kg)  Height: 5\' 4"  (1.626 m)  PainSc: 4    Body mass index is 23.17 kg/m.  Advanced Directives 05/27/2019 11/24/2018 11/11/2018 09/16/2018 09/04/2018 08/08/2018 08/06/2018  Does Patient Have a Medical Advance Directive? Yes Yes Yes No No Yes Yes  Type of Paramedic of [place];Living will [place];Living will [place];Living will - - Healthcare Power of [place]  Does patient want to make changes to medical advance directive? No - Patient declined No - Patient declined No - Patient declined - - No - Patient declined -  Copy of [place] in Chart? No - copy requested - No - copy requested - - No - copy requested No - copy requested  Would patient like information on creating a medical advance directive? - - - No - Patient declined No - Patient declined No - Guardian declined -    Current Medications (verified) Outpatient Encounter Medications as of 05/27/2019  Medication Sig  . acetaminophen (TYLENOL) 500 MG tablet Take 1,000 mg by mouth every morning.  . clindamycin (CLEOCIN) 150 MG capsule Dental Appt only  .  Deutetrabenazine (AUSTEDO) 12 MG TABS Take 24 mg by mouth 2 (two) times daily.  Marland Kitchen oxyCODONE-acetaminophen (PERCOCET/ROXICET) 5-325 MG tablet Take 1 tablet by mouth every 8 (eight) hours as needed for severe pain.  Marland Kitchen senna (SENOKOT) 8.6 MG tablet Take by mouth.  . sennosides-docusate sodium (SENOKOT-S) 8.6-50 MG tablet Take 1 tablet by mouth daily.  . sertraline (ZOLOFT) 100 MG tablet Take 1 tablet (100 mg total) by mouth daily.   No facility-administered encounter medications on file as of 05/27/2019.    Allergies (verified) Bee venom, Nsaids, Adhesive [tape], Codeine, Penicillins, and Sulfa antibiotics   History: Past Medical History:  Diagnosis Date  . Acute embolism and thrombosis of deep vein of both distal lower extremities (HCC)   . Arthritis    hip  . C. difficile diarrhea   . Endometriosis   . Huntington disease ([place])   . Peptic ulcer   . Perforated gastric ulcer ([place])   . Scoliosis    Past Surgical History:  Procedure Laterality Date  . AMPUTATION Right 08/08/2018   Procedure: AMPUTATION BELOW KNEE RIGHT LOWER EXTREMITY;  Surgeon: Rosetta Posner, MD;  Location: [place];  Service: Vascular;  Laterality: Right;  . ANTERIOR LAT LUMBAR FUSION Left 01/05/2014   Procedure: LUMBAR TWO TO THREE, LUMBAR LUMBAR THREE TO FOUR, ANTERIOR LATERAL LUMBAR FUSION 2 LEVELS;  Surgeon: Faythe Ghee, MD;  [facility];  Service: Neurosurgery;  Laterality: Left;  .  APPENDECTOMY  1963  . BIOPSY  11/25/2018   Procedure: BIOPSY;  Surgeon: Sherrilyn Rist, MD;  [facility];  Service: Gastroenterology;;  . BREAST SURGERY Left 1987   bx  . COLONOSCOPY W/ POLYPECTOMY    . ESOPHAGOGASTRODUODENOSCOPY (EGD) WITH PROPOFOL N/A 11/25/2018   Procedure: ESOPHAGOGASTRODUODENOSCOPY (EGD) WITH PROPOFOL;  Surgeon: Sherrilyn Rist, MD;  [facility];  Service: Gastroenterology;  Laterality: N/A;  . EYE SURGERY Bilateral   . FEMORAL-POPLITEAL BYPASS GRAFT Bilateral 08/06/2018    Procedure: BILATERAL POPLITEAL AND TIBIAL EMBOLECTOMIES, LEFT LEG FASCIOTOMY;  Surgeon: Larina Earthly, MD;  [facility];  Service: Vascular;  Laterality: Bilateral;  . LAPAROSCOPY N/A 07/30/2018   Procedure: DIAGNOSTIC LAPAROSCOPY, Omentopexy gram patch, Washout of intra-abdominal abcesses x 3;  Surgeon: Karie Soda, MD;  [facility];  Service: General;  Laterality: N/A;  . OOPHORECTOMY Left 1977  . Planters wart Left 2005  . radial keratoto Bilateral   . TONSILLECTOMY  1958  . TOTAL HIP ARTHROPLASTY Left 07/19/2014   Procedure: TOTAL HIP ARTHROPLASTY;  Surgeon: Gean Birchwood, MD;  [facility];  Service: Orthopedics;  Laterality: Left;  . TUBAL LIGATION  1986   Family History  Problem Relation Age of Onset  . Lung cancer Mother   . Stroke Father   . Huntington's disease Paternal Grandmother   . Huntington's disease Paternal Aunt   . Huntington's disease Paternal Uncle   . Arthritis Neg Hx        family hx  . Cancer Neg Hx        prostate ca -family hx  . Heart disease Neg Hx        family hx  . Colon cancer Neg Hx   . Esophageal cancer Neg Hx   . Pancreatic cancer Neg Hx   . Liver disease Neg Hx    Social History   Socioeconomic History  . Marital status: Married    Spouse name: Not on file  . Number of children: Not on file  . Years of education: Not on file  . Highest education level: Not on file  Occupational History  . Occupation: unemployed    Comment: disabled back pain  Tobacco Use  . Smoking status: Former Smoker    Packs/day: 1.00    Years: 8.00    Pack years: 8.00    Types: Cigarettes    Quit date: 01/17/1994    Years since quitting: 25.3  . Smokeless tobacco: Never Used  Substance and Sexual Activity  . Alcohol use: Yes    Alcohol/week: 3.0 standard drinks    Types: 3 Cans of beer per week    Comment: per week  . Drug use: No  . Sexual activity: Not on file  Other Topics Concern  . Not on file  Social History Narrative   Married    Right BKA    Disabled due to back problems       Social Determinants of Health   Financial Resource Strain: Low Risk   . Difficulty of Paying Living Expenses: Not very hard  Food Insecurity: No Food Insecurity  . Worried About Programme researcher, broadcasting/film/video in the Last Year: Never true  . Ran Out of Food in the Last Year: Never true  Transportation Needs: No Transportation Needs  . Lack of Transportation (Medical): No  . Lack of Transportation (Non-Medical): No  Physical Activity: Sufficiently Active  . Days of Exercise per Week: 3 days  . Minutes of  Exercise per Session: 60 min  Stress: Stress Concern Present  . Feeling of Stress : To some extent  Social Connections: Unknown  . Frequency of Communication with Friends and Family: More than three times a week  . Frequency of Social Gatherings with Friends and Family: Once a week  . Attends Religious Services: Not on file  . Active Member of Clubs or Organizations: Not on file  . Attends Banker Meetings: Not on file  . Marital Status: Married    Tobacco Counseling Counseling given: Not Answered   Clinical Intake:  Pre-visit preparation completed: Yes  Pain : 0-10 Pain Score: 4  Pain Type: Acute pain Pain Location: Buttocks Pain Orientation: Left Pain Descriptors / Indicators: Sharp     BMI - recorded: 23.17 Nutritional Status: BMI of 19-24  Normal Nutritional Risks: None Diabetes: No  How often do you need to have someone help you when you read instructions, pamphlets, or other written materials from your doctor or pharmacy?: 1 - Never  Interpreter Needed?: No  Information entered by :: Nathaniel Man, LPN.   Activities of Daily Living In your present state of health, do you have any difficulty performing the following activities: 05/27/2019 07/31/2018  Hearing? N -  Vision? N -  Difficulty concentrating or making decisions? N -  Walking or climbing stairs? Y -  Dressing or bathing? N -  Doing errands,  shopping? [place]  Preparing Food and eating ? Y -  Using the Toilet? N -  In the past six months, have you accidently leaked urine? Y -  Do you have problems with loss of bowel control? Y -  Managing your Medications? Y -  Managing your Finances? N -  Housekeeping or managing your Housekeeping? Y -  Some recent data might be hidden     Immunizations and Health Maintenance Immunization History  Administered Date(s) Administered  . Fluad Quad(high Dose 65+) 12/19/2018  . HiB (PRP-T) 08/12/2018  . Influenza Split 01/31/2011  . Influenza, High Dose Seasonal PF 12/17/2016  . Influenza,inj,Quad PF,6+ Mos 03/10/2013, 03/23/2014  . Influenza-Unspecified 12/01/2015  . Meningococcal Mcv4o 08/11/2018  . Pneumococcal Conjugate-13 08/11/2018  . Pneumococcal Polysaccharide-23 02/07/2006  . Td 04/09/2002  . Tdap 10/09/2017  . Zoster 01/31/2011  . Zoster Recombinat (Shingrix) 10/14/2017, 12/22/2017   Health Maintenance Due  Topic Date Due  . MAMMOGRAM  05/14/2003  . COLONOSCOPY  01/07/2013  . DEXA SCAN  05/13/2018    Patient Care Team: Kristian Covey, MD as PCP - General (Family Medicine) Aliene Beams, MD as Referring Physician (Neurosurgery) Tat, Octaviano Batty, DO as Consulting Physician (Neurology) Karie Soda, MD as Consulting Physician (General Surgery) Wynema Birch, MD as Consulting Physician (Neurology)  Indicate any recent Medical Services you may have received from other than [facility] providers in the past year (date may be approximate).     Assessment:   This is a routine wellness examination for Thamara.  Hearing/Vision screen  Hearing Screening   125Hz  250Hz  500Hz  1000Hz  2000Hz  3000Hz  4000Hz  6000Hz  8000Hz   Right ear:           Left ear:           Comments: Wears bilateral hearing aids and they are working fine  Vision Screening Comments: Wears reading glasses; last eye exam was &gt; 1 year ago   Dietary issues and exercise activities discussed: Current  Exercise Habits: Structured exercise class(outpatient physical therapy), Type of exercise: walking;strength training/weights, Time (Minutes): 60, Frequency (Times/Week): 3,  Weekly Exercise (Minutes/Week): 180, Intensity: Mild, Exercise limited by: orthopedic condition(s)  Goals    . DIET - EAT MORE FRUITS AND VEGETABLES     Eat more fruits to get proper nutrition      Depression Screen PHQ 2/9 Scores 05/27/2019  PHQ - 2 Score 4  PHQ- 9 Score 4    Fall Risk Fall Risk  05/27/2019 12/13/2017 11/29/2017 10/22/2017  Falls in the past year? 1 Yes No No  Number falls in past yr: 0 2 or more - -  Injury with Fall? 0 No - -  Risk Factor Category  - High Fall Risk - -  Risk for fall due to : Impaired mobility;History of fall(s);Medication side effect - - -  Follow up Falls evaluation completed;Education provided;Falls prevention discussed Falls evaluation completed - -    Is the patient's home free of loose throw rugs in walkways, pet beds, electrical cords, etc?   yes      Grab bars in the bathroom? yes      Handrails on the stairs?   yes      Adequate lighting?   yes  Timed Get Up and Go Performed unable to perform; in wheelchair today.Has right BKA and can ambulate with a walker.  Cognitive Function: MMSE - Mini Mental State Exam 10/09/2017  Orientation to time 5  Orientation to Place 5  Registration 3  Attention/ Calculation 5  Recall 3  Language- name 2 objects 2  Language- repeat 1  Language- follow 3 step command 3  Language- read & follow direction 1  Write a sentence 1  Copy design 0  Total score 29     6CIT Screen 05/27/2019  What Year? 0 points  What month? 0 points  What time? 0 points  Count back from 20 0 points  Months in reverse 0 points  Repeat phrase 0 points  Total Score 0    Screening Tests Health Maintenance  Topic Date Due  . MAMMOGRAM  05/14/2003  . COLONOSCOPY  01/07/2013  . DEXA SCAN  05/13/2018  . PNA vac Low Risk Adult (2 of 2 - PPSV23) 08/11/2019   . TETANUS/TDAP  10/10/2027  . INFLUENZA VACCINE  Completed  . Hepatitis C Screening  Completed    Qualifies for Shingles Vaccine? Patient has completed shingrix.  Cancer Screenings: Lung: Low Dose CT Chest recommended if Age 71-80 years, 30 pack-year currently smoking OR have quit w/in 15years. Patient does not qualify. Breast: Up to date on Mammogram? No   Up to date of Bone Density/Dexa? No Colorectal: unsure; records [facility]. Patient states she had a colonoscopy several years ago. Last record in chart states 2004.  Additional Screenings:  Hepatitis C Screening: completed 10/09/2017.    Plan:   Mrs. Gasser is up to date with all immunizations. [facility] and DEXA scan. No nurse concerns at this time.  I have personally reviewed and noted the following in the patient's chart:   . Medical and social history . Use of alcohol, tobacco or illicit drugs  . Current medications and supplements . Functional ability and status . Nutritional status . Physical activity . Advanced directives . List of other physicians . Hospitalizations, surgeries, and ER visits in previous 12 months . Vitals . Screenings to include cognitive, depression, and falls . Referrals and appointments  In addition, I have reviewed and discussed with patient certain preventive protocols, quality metrics, and best practice recommendations. A  written personalized care plan for preventive services as well as general preventive health recommendations were provided to patient.     Nathaniel Man, LPN   09/13/3014

## 2019-05-27 NOTE — Progress Notes (Signed)
Subjective:     Patient ID: Natasha Frederick, female   DOB: 1955-06-23, 64 y.o.   MRN: [id number]  HPI   [name] is seen today for painful callused area left buttock near the junction with the thigh.  She was seen here recently for the same.  We do not see any evidence for open ulceration.  Pain is worse with sitting.  She is also here to see our health coach today for Medicare wellness visit.  Her pain in the involved area is moderate.  No drainage.  Past Medical History:  Diagnosis Date  . Acute embolism and thrombosis of deep vein of both distal lower extremities (HCC)   . Arthritis    hip  . C. difficile diarrhea   . Endometriosis   . Huntington disease ([place])   . Peptic ulcer   . Perforated gastric ulcer ([place])   . Scoliosis    Past Surgical History:  Procedure Laterality Date  . AMPUTATION Right 08/08/2018   Procedure: AMPUTATION BELOW KNEE RIGHT LOWER EXTREMITY;  Surgeon: Rosetta Posner, MD;  Location: [place];  Service: Vascular;  Laterality: Right;  . ANTERIOR LAT LUMBAR FUSION Left 01/05/2014   Procedure: LUMBAR TWO TO THREE, LUMBAR LUMBAR THREE TO FOUR, ANTERIOR LATERAL LUMBAR FUSION 2 LEVELS;  Surgeon: Faythe Ghee, MD;  [facility];  Service: Neurosurgery;  Laterality: Left;  . APPENDECTOMY  1963  . BIOPSY  11/25/2018   Procedure: BIOPSY;  Surgeon: Doran Stabler, MD;  [facility];  Service: Gastroenterology;;  . BREAST SURGERY Left 1987   bx  . COLONOSCOPY W/ POLYPECTOMY    . ESOPHAGOGASTRODUODENOSCOPY (EGD) WITH PROPOFOL N/A 11/25/2018   Procedure: ESOPHAGOGASTRODUODENOSCOPY (EGD) WITH PROPOFOL;  Surgeon: Doran Stabler, MD;  [facility];  Service: Gastroenterology;  Laterality: N/A;  . EYE SURGERY Bilateral   . FEMORAL-POPLITEAL BYPASS GRAFT Bilateral 08/06/2018   Procedure: BILATERAL POPLITEAL AND TIBIAL EMBOLECTOMIES, LEFT LEG FASCIOTOMY;  Surgeon: Rosetta Posner, MD;  Location: [place];  Service: Vascular;  Laterality: Bilateral;  .  LAPAROSCOPY N/A 07/30/2018   Procedure: DIAGNOSTIC LAPAROSCOPY, Omentopexy gram patch, Washout of intra-abdominal abcesses x 3;  Surgeon: Michael Boston, MD;  [facility];  Service: General;  Laterality: N/A;  . OOPHORECTOMY Left 1977  . Planters wart Left 2005  . radial keratoto Bilateral   . TONSILLECTOMY  1958  . TOTAL HIP ARTHROPLASTY Left 07/19/2014   Procedure: TOTAL HIP ARTHROPLASTY;  Surgeon: Frederik Pear, MD;  Location: [place];  Service: Orthopedics;  Laterality: Left;  . TUBAL LIGATION  1986    reports that she quit smoking about 25 years ago. Her smoking use included cigarettes. She has a 8.00 pack-year smoking history. She has never used smokeless tobacco. She reports current alcohol use of about 3.0 standard drinks of alcohol per week. She reports that she does not use drugs. family history includes Huntington's disease in her paternal aunt, paternal grandmother, and paternal uncle; Lung cancer in her mother; Stroke in her father. Allergies  Allergen Reactions  . Bee Venom Anaphylaxis and Swelling  . Nsaids Other (See Comments)    PERFORATED ULCER = NO MORE NSAIDs  . Adhesive [Tape] Other (See Comments)    redness  . Codeine Nausea And Vomiting  . Penicillins     Did it involve swelling of the face/tongue/throat, SOB, or low BP? Yes Did it involve sudden or severe rash/hives, skin peeling, or any reaction on the inside of your mouth or nose?  Unknown Did you need to seek medical attention at a hospital or doctor's office? Yes When did it last happen?age 28-40 If all above answers are "NO", may proceed with cephalosporin use.   . Sulfa Antibiotics Itching     Review of Systems  Constitutional: Negative for chills and fever.       Objective:   Physical Exam Vitals reviewed.  Constitutional:      Appearance: Normal appearance.  Cardiovascular:     Rate and Rhythm: Normal rate and regular rhythm.  Skin:    Comments: Her left buttock and left upper thigh  region is examined.  She has very small elongated callused area which is about 2 x 3 mm dimension.  No fluctuation.  No surrounding erythema.  No ulceration.  Neurological:     Mental Status: She is alert.        Assessment:     Small callused area of the skin at the junction of her left thigh and buttock.  Suspect this is painful from pushing into the underlying skin    Plan:     -We recommended trimming this down with #15 blade and patient consented.  We gently trimmed back and flatten the hard callused area and patient had some immediate relief.  There is no evidence for any underlying ulcer. -We recommended application of Vaseline at least twice daily to this area and try to shift positions as much as possible  Kristian Covey MD [facility]

## 2019-06-01 DIAGNOSIS — R262 Difficulty in walking, not elsewhere classified: Secondary | ICD-10-CM | POA: Diagnosis not present

## 2019-06-01 DIAGNOSIS — R260 Ataxic gait: Secondary | ICD-10-CM | POA: Diagnosis not present

## 2019-06-03 DIAGNOSIS — R260 Ataxic gait: Secondary | ICD-10-CM | POA: Diagnosis not present

## 2019-06-03 DIAGNOSIS — R262 Difficulty in walking, not elsewhere classified: Secondary | ICD-10-CM | POA: Diagnosis not present

## 2019-06-05 DIAGNOSIS — R262 Difficulty in walking, not elsewhere classified: Secondary | ICD-10-CM | POA: Diagnosis not present

## 2019-06-05 DIAGNOSIS — R260 Ataxic gait: Secondary | ICD-10-CM | POA: Diagnosis not present

## 2019-06-08 DIAGNOSIS — R262 Difficulty in walking, not elsewhere classified: Secondary | ICD-10-CM | POA: Diagnosis not present

## 2019-06-08 DIAGNOSIS — R260 Ataxic gait: Secondary | ICD-10-CM | POA: Diagnosis not present

## 2019-06-10 DIAGNOSIS — R262 Difficulty in walking, not elsewhere classified: Secondary | ICD-10-CM | POA: Diagnosis not present

## 2019-06-10 DIAGNOSIS — R260 Ataxic gait: Secondary | ICD-10-CM | POA: Diagnosis not present

## 2019-06-12 DIAGNOSIS — R262 Difficulty in walking, not elsewhere classified: Secondary | ICD-10-CM | POA: Diagnosis not present

## 2019-06-12 DIAGNOSIS — R260 Ataxic gait: Secondary | ICD-10-CM | POA: Diagnosis not present

## 2019-06-15 DIAGNOSIS — R260 Ataxic gait: Secondary | ICD-10-CM | POA: Diagnosis not present

## 2019-06-15 DIAGNOSIS — R262 Difficulty in walking, not elsewhere classified: Secondary | ICD-10-CM | POA: Diagnosis not present

## 2019-06-17 DIAGNOSIS — R260 Ataxic gait: Secondary | ICD-10-CM | POA: Diagnosis not present

## 2019-06-17 DIAGNOSIS — R262 Difficulty in walking, not elsewhere classified: Secondary | ICD-10-CM | POA: Diagnosis not present

## 2019-06-19 DIAGNOSIS — R260 Ataxic gait: Secondary | ICD-10-CM | POA: Diagnosis not present

## 2019-06-19 DIAGNOSIS — R262 Difficulty in walking, not elsewhere classified: Secondary | ICD-10-CM | POA: Diagnosis not present

## 2019-06-22 DIAGNOSIS — R260 Ataxic gait: Secondary | ICD-10-CM | POA: Diagnosis not present

## 2019-06-22 DIAGNOSIS — R262 Difficulty in walking, not elsewhere classified: Secondary | ICD-10-CM | POA: Diagnosis not present

## 2019-06-24 DIAGNOSIS — R260 Ataxic gait: Secondary | ICD-10-CM | POA: Diagnosis not present

## 2019-06-24 DIAGNOSIS — R262 Difficulty in walking, not elsewhere classified: Secondary | ICD-10-CM | POA: Diagnosis not present

## 2019-06-27 IMAGING — CT CT HEAD WITHOUT CONTRAST
3 series · 16 of 46 positions shown, 19 images · non-contrast
Comparison: None.

CLINICAL DATA: Recent fall with headaches, initial encounter

EXAM:
CT HEAD WITHOUT CONTRAST
TECHNIQUE: Contiguous axial images were obtained from the base of the skull
through the vertex without intravenous contrast.

[Series 2: head wo · axial · 0.47mm/px · z∈[-126,-6]mm · 10 of 29 slices shown, 13 images]
[im 3/29  brain]
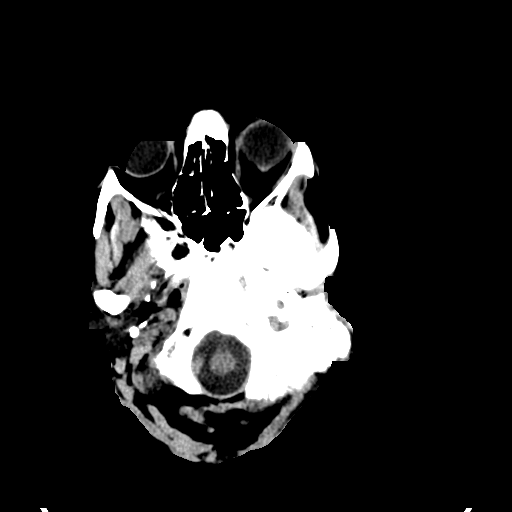
[im 3/29  bone]
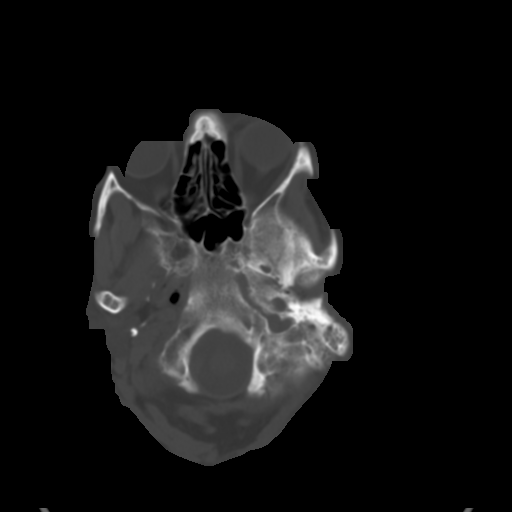
[im 6/29  brain]
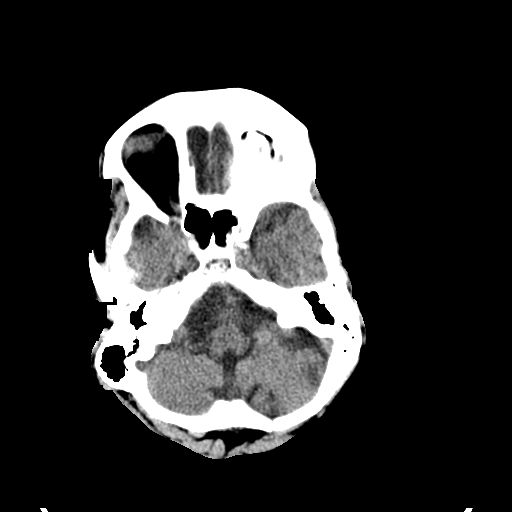
[im 8/29  brain]
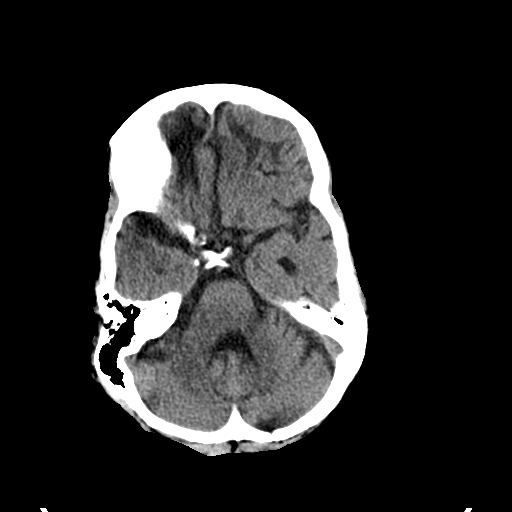
[im 11/29  brain]
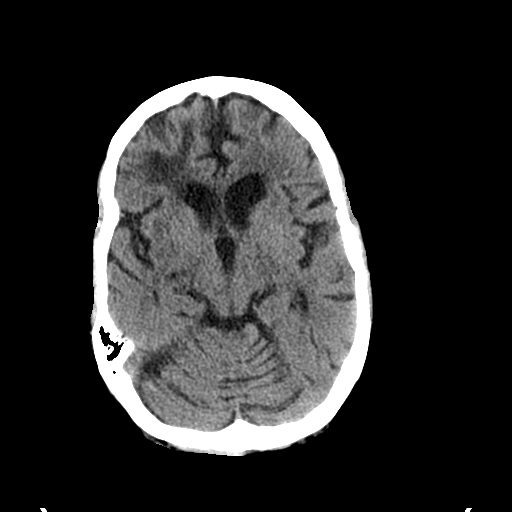
[im 14/29  brain]
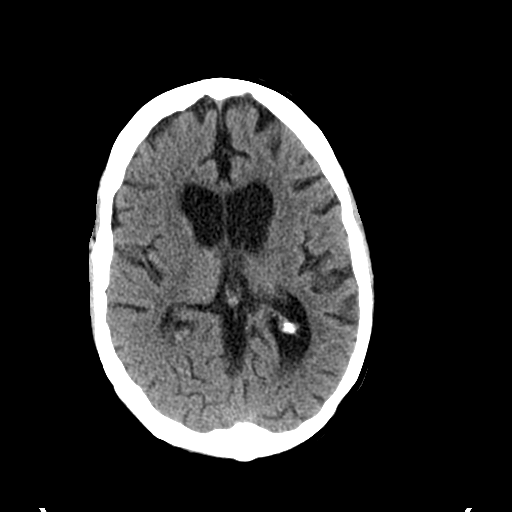
[im 14/29  bone]
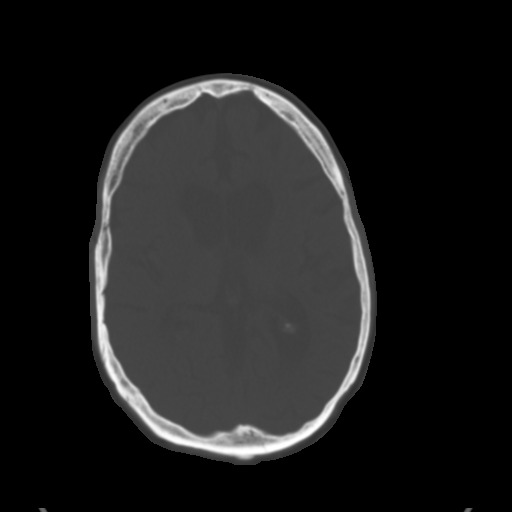
[im 16/29  brain]
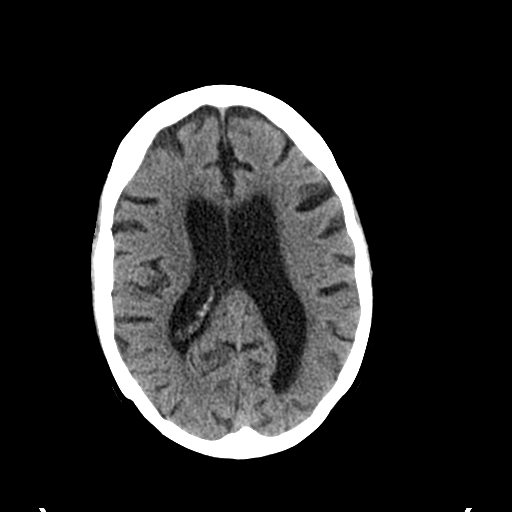
[im 19/29  brain]
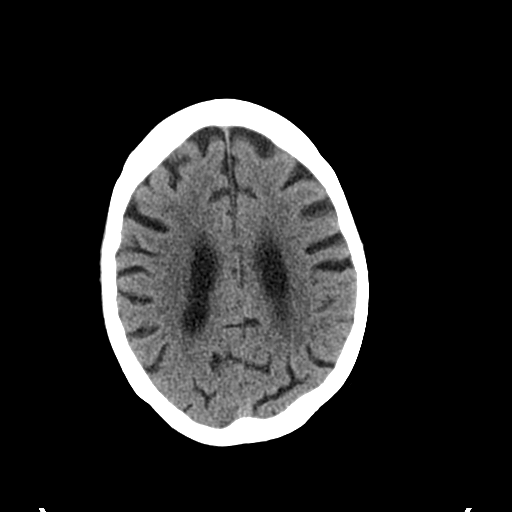
[im 22/29  brain]
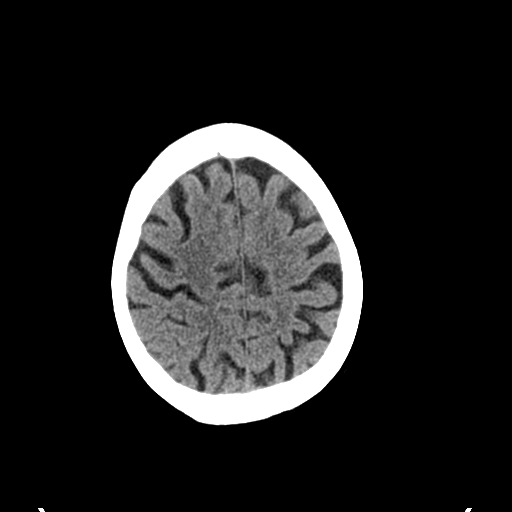
[im 24/29  brain]
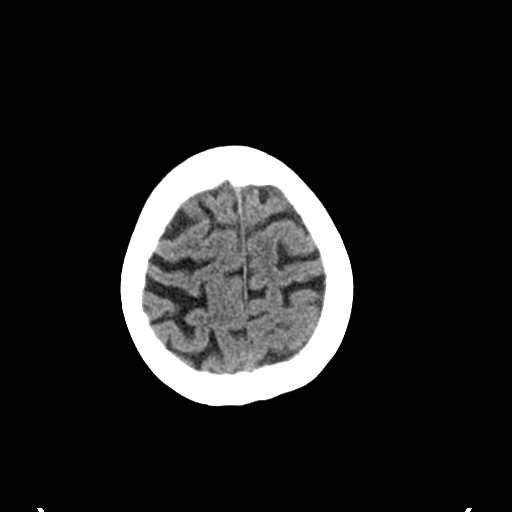
[im 24/29  bone]
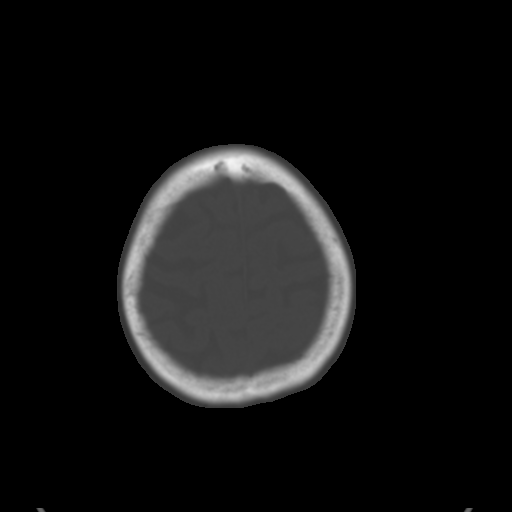
[im 27/29  brain]
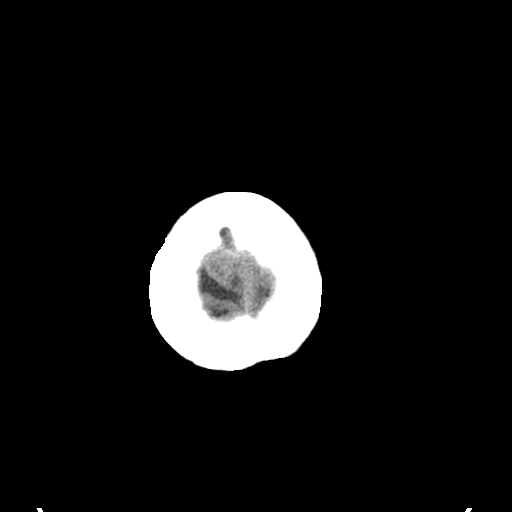

[Series 4: coronal soft tissue · coronal · 0.28mm/px · 3 of 61 slices shown]
[im 21/61  brain]
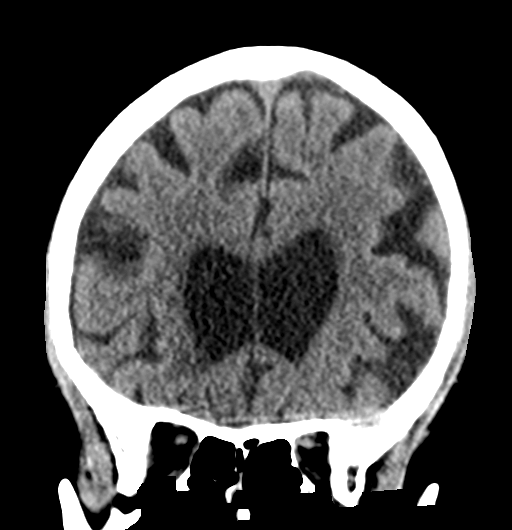
[im 27/61  brain]
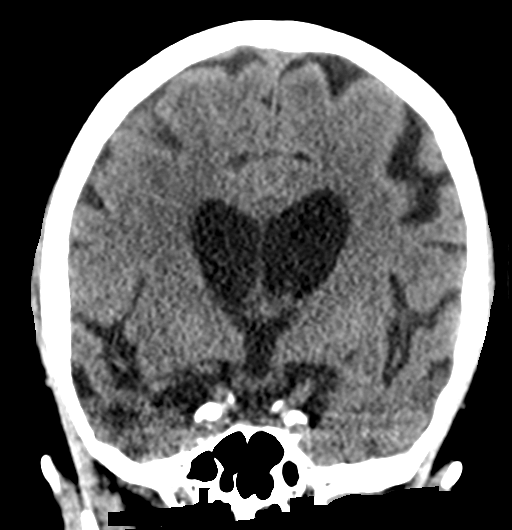
[im 34/61  brain]
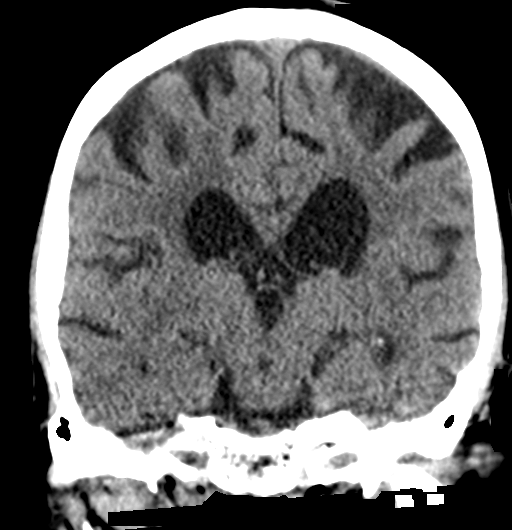

[Series 5: sagittal soft tissue · sagittal · 0.28mm/px · 3 of 47 slices shown]
[im 16/47  brain]
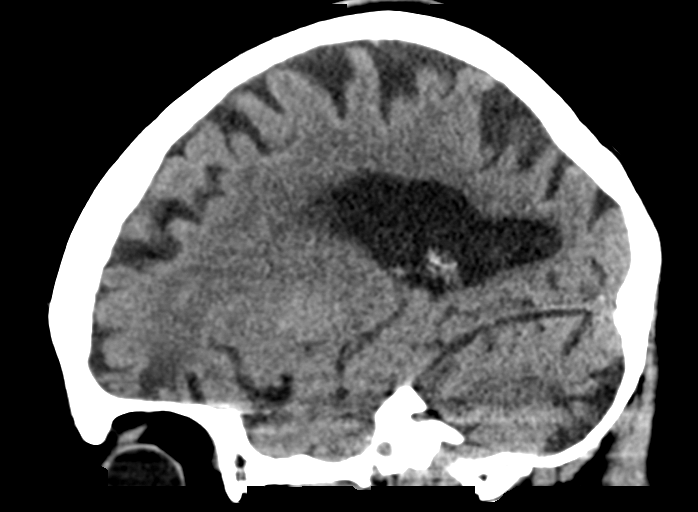
[im 24/47  brain]
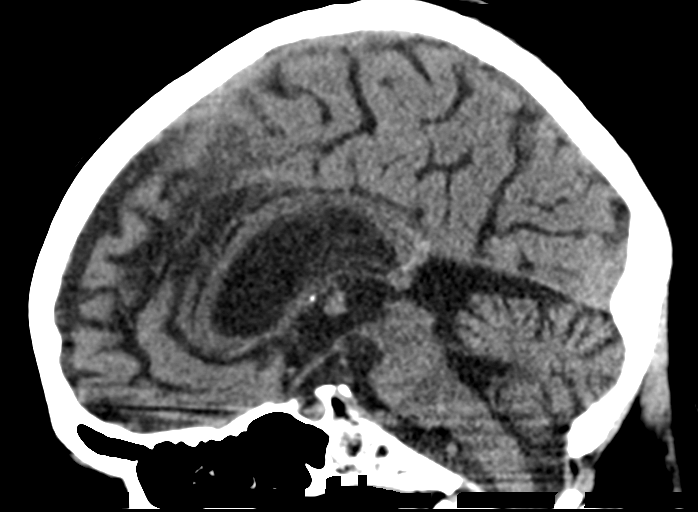
[im 31/47  brain]
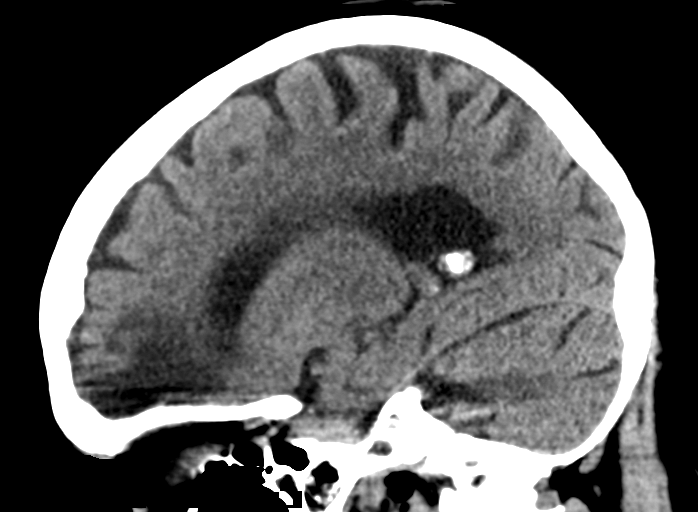

[16 of 46 positions shown; findings below may reference images not displayed]

FINDINGS: Brain: Atrophic changes and chronic white matter ischemic changes
are noted. Changes of prior infarct in the right frontal lobe with
encephalomalacia is seen. No acute hemorrhage, acute infarction or
space-occupying mass lesion is noted.

Vascular: No hyperdense vessel or unexpected calcification.

Skull: Normal. Negative for fracture or focal lesion.

Sinuses/Orbits: No acute finding.

Other: None.
IMPRESSION: Atrophic changes and chronic white matter ischemic change.

Prior right frontal lobe infarct.

No acute abnormality noted.

## 2019-06-29 ENCOUNTER — Ambulatory Visit (INDEPENDENT_AMBULATORY_CARE_PROVIDER_SITE_OTHER): Payer: Medicare Other | Admitting: Family Medicine

## 2019-06-29 ENCOUNTER — Encounter: Payer: Self-pay | Admitting: Family Medicine

## 2019-06-29 ENCOUNTER — Other Ambulatory Visit: Payer: Self-pay

## 2019-06-29 VITALS — BP 118/70 | HR 91 | Temp 98.0°F

## 2019-06-29 DIAGNOSIS — L84 Corns and callosities: Secondary | ICD-10-CM | POA: Diagnosis not present

## 2019-06-29 DIAGNOSIS — R260 Ataxic gait: Secondary | ICD-10-CM | POA: Diagnosis not present

## 2019-06-29 DIAGNOSIS — Z89511 Acquired absence of right leg below knee: Secondary | ICD-10-CM

## 2019-06-29 DIAGNOSIS — R262 Difficulty in walking, not elsewhere classified: Secondary | ICD-10-CM | POA: Diagnosis not present

## 2019-06-29 NOTE — Progress Notes (Signed)
Subjective:     Patient ID: Natasha Frederick, female   DOB: 14-Jan-1956, 64 y.o.   MRN: [id number]  HPI   Natasha Frederick has history of Huntington's disease and prior below-knee amputation right lower extremity.  She has callused area just below her left buttock.  She spends most of her day in a wheelchair.  We had recently trimmed that callused area and this felt better for couple weeks but now she has recurrent callus.  She has been try applying some topical over-the-counter antibiotic ointment without much improvement.  They tried Vaseline prior to that.  Past Medical History:  Diagnosis Date  . Acute embolism and thrombosis of deep vein of both distal lower extremities (HCC)   . Arthritis    hip  . C. difficile diarrhea   . Endometriosis   . Huntington disease ([place])   . Peptic ulcer   . Perforated gastric ulcer ([place])   . Scoliosis    Past Surgical History:  Procedure Laterality Date  . AMPUTATION Right 08/08/2018   Procedure: AMPUTATION BELOW KNEE RIGHT LOWER EXTREMITY;  Surgeon: Rosetta Posner, MD;  Location: [place];  Service: Vascular;  Laterality: Right;  . ANTERIOR LAT LUMBAR FUSION Left 01/05/2014   Procedure: LUMBAR TWO TO THREE, LUMBAR LUMBAR THREE TO FOUR, ANTERIOR LATERAL LUMBAR FUSION 2 LEVELS;  Surgeon: Faythe Ghee, MD;  [facility];  Service: Neurosurgery;  Laterality: Left;  . APPENDECTOMY  1963  . BIOPSY  11/25/2018   Procedure: BIOPSY;  Surgeon: Doran Stabler, MD;  [facility];  Service: Gastroenterology;;  . BREAST SURGERY Left 1987   bx  . COLONOSCOPY W/ POLYPECTOMY    . ESOPHAGOGASTRODUODENOSCOPY (EGD) WITH PROPOFOL N/A 11/25/2018   Procedure: ESOPHAGOGASTRODUODENOSCOPY (EGD) WITH PROPOFOL;  Surgeon: Doran Stabler, MD;  [facility];  Service: Gastroenterology;  Laterality: N/A;  . EYE SURGERY Bilateral   . FEMORAL-POPLITEAL BYPASS GRAFT Bilateral 08/06/2018   Procedure: BILATERAL POPLITEAL AND TIBIAL EMBOLECTOMIES, LEFT LEG FASCIOTOMY;   Surgeon: Rosetta Posner, MD;  Location: [place];  Service: Vascular;  Laterality: Bilateral;  . LAPAROSCOPY N/A 07/30/2018   Procedure: DIAGNOSTIC LAPAROSCOPY, Omentopexy gram patch, Washout of intra-abdominal abcesses x 3;  Surgeon: Michael Boston, MD;  [facility];  Service: General;  Laterality: N/A;  . OOPHORECTOMY Left 1977  . Planters wart Left 2005  . radial keratoto Bilateral   . TONSILLECTOMY  1958  . TOTAL HIP ARTHROPLASTY Left 07/19/2014   Procedure: TOTAL HIP ARTHROPLASTY;  Surgeon: Frederik Pear, MD;  Location: [place];  Service: Orthopedics;  Laterality: Left;  . TUBAL LIGATION  1986    reports that she quit smoking about 25 years ago. Her smoking use included cigarettes. She has a 8.00 pack-year smoking history. She has never used smokeless tobacco. She reports current alcohol use of about 3.0 standard drinks of alcohol per week. She reports that she does not use drugs. family history includes Huntington's disease in her paternal aunt, paternal grandmother, and paternal uncle; Lung cancer in her mother; Stroke in her father. Allergies  Allergen Reactions  . Bee Venom Anaphylaxis and Swelling  . Nsaids Other (See Comments)    PERFORATED ULCER = NO MORE NSAIDs  . Adhesive [Tape] Other (See Comments)    redness  . Codeine Nausea And Vomiting  . Penicillins     Did it involve swelling of the face/tongue/throat, SOB, or low BP? Yes Did it involve sudden or severe rash/hives, skin peeling, or any reaction on  the inside of your mouth or nose? Unknown Did you need to seek medical attention at a hospital or doctor's office? Yes When did it last happen?age 11-40 If all above answers are "NO", may proceed with cephalosporin use.   . Sulfa Antibiotics Itching     Review of Systems  Constitutional: Negative for chills and fever.  Skin: Negative for rash.       Objective:   Physical Exam Vitals reviewed.  Constitutional:      Appearance: Normal appearance.  Skin:     Comments: She has small callus left upper thigh just below the buttock region.  This is approximately 1/2 to 1/2 cm.  No surrounding cellulitis changes.  No fluctuance.  Mild tenderness to palpation  Neurological:     Mental Status: She is alert.        Assessment:     Recurrent callus left upper thigh    Plan:     -We recommended trimming this down with #15 blade and patient consented.  We pared down the hard callused center and patient tolerated well. -Consider trial of DuoDERM hopefully to give some protection to this and also promote healing and do Derm patch was applied here to leave on 4 to 5 days then remove and clean with soap and water then reapplied.  After a few cycles of this they will be in touch  Kristian Covey MD [facility] '

## 2019-06-29 NOTE — Patient Instructions (Signed)
Leave the Duoderm on for 4-5 days and then remove and clean with soap and water and re-apply the duoderm.

## 2019-07-01 DIAGNOSIS — R296 Repeated falls: Secondary | ICD-10-CM | POA: Diagnosis not present

## 2019-07-01 DIAGNOSIS — R260 Ataxic gait: Secondary | ICD-10-CM | POA: Diagnosis not present

## 2019-07-01 DIAGNOSIS — R262 Difficulty in walking, not elsewhere classified: Secondary | ICD-10-CM | POA: Diagnosis not present

## 2019-07-01 DIAGNOSIS — Z9181 History of falling: Secondary | ICD-10-CM | POA: Diagnosis not present

## 2019-07-02 ENCOUNTER — Telehealth: Payer: Self-pay | Admitting: *Deleted

## 2019-07-02 MED ORDER — OXYCODONE-ACETAMINOPHEN 5-325 MG PO TABS: 1.0000 | ORAL_TABLET | Freq: Three times a day (TID) | ORAL | 0 refills | Status: DC | PRN

## 2019-07-02 NOTE — Addendum Note (Signed)
Addended by: Kristian Covey on: 07/02/2019 09:52 AM   Modules accepted: Orders

## 2019-07-02 NOTE — Telephone Encounter (Signed)
Patient's husband called the after hours line and caller states his wife needs Oxycodone refilled.Please refill Natasha Frederick's Oxycodone.

## 2019-07-03 DIAGNOSIS — R296 Repeated falls: Secondary | ICD-10-CM | POA: Diagnosis not present

## 2019-07-03 DIAGNOSIS — R262 Difficulty in walking, not elsewhere classified: Secondary | ICD-10-CM | POA: Diagnosis not present

## 2019-07-03 DIAGNOSIS — Z9181 History of falling: Secondary | ICD-10-CM | POA: Diagnosis not present

## 2019-07-03 DIAGNOSIS — R260 Ataxic gait: Secondary | ICD-10-CM | POA: Diagnosis not present

## 2019-07-06 DIAGNOSIS — R262 Difficulty in walking, not elsewhere classified: Secondary | ICD-10-CM | POA: Diagnosis not present

## 2019-07-06 DIAGNOSIS — R260 Ataxic gait: Secondary | ICD-10-CM | POA: Diagnosis not present

## 2019-07-06 DIAGNOSIS — Z9181 History of falling: Secondary | ICD-10-CM | POA: Diagnosis not present

## 2019-07-06 DIAGNOSIS — R296 Repeated falls: Secondary | ICD-10-CM | POA: Diagnosis not present

## 2019-07-07 ENCOUNTER — Telehealth: Payer: Self-pay | Admitting: *Deleted

## 2019-07-07 NOTE — Telephone Encounter (Signed)
Patient's husband left a message stating he has a lot of questions for Dr Early regarding phantom pain RBKA and left foot drop. Patient was last seen 11/11/18. Spoke with Dr early he stated this should be handled by her rehabilitation Dr but he would be happy to see them in the office if they wanted to do that. Called and got answering machine left message with information to call if they needed an appointment.

## 2019-07-08 DIAGNOSIS — R296 Repeated falls: Secondary | ICD-10-CM | POA: Diagnosis not present

## 2019-07-08 DIAGNOSIS — R262 Difficulty in walking, not elsewhere classified: Secondary | ICD-10-CM | POA: Diagnosis not present

## 2019-07-08 DIAGNOSIS — R260 Ataxic gait: Secondary | ICD-10-CM | POA: Diagnosis not present

## 2019-07-08 DIAGNOSIS — Z9181 History of falling: Secondary | ICD-10-CM | POA: Diagnosis not present

## 2019-07-10 DIAGNOSIS — Z9181 History of falling: Secondary | ICD-10-CM | POA: Diagnosis not present

## 2019-07-10 DIAGNOSIS — R296 Repeated falls: Secondary | ICD-10-CM | POA: Diagnosis not present

## 2019-07-10 DIAGNOSIS — R260 Ataxic gait: Secondary | ICD-10-CM | POA: Diagnosis not present

## 2019-07-10 DIAGNOSIS — R262 Difficulty in walking, not elsewhere classified: Secondary | ICD-10-CM | POA: Diagnosis not present

## 2019-07-13 DIAGNOSIS — R260 Ataxic gait: Secondary | ICD-10-CM | POA: Diagnosis not present

## 2019-07-13 DIAGNOSIS — R296 Repeated falls: Secondary | ICD-10-CM | POA: Diagnosis not present

## 2019-07-13 DIAGNOSIS — Z9181 History of falling: Secondary | ICD-10-CM | POA: Diagnosis not present

## 2019-07-13 DIAGNOSIS — R262 Difficulty in walking, not elsewhere classified: Secondary | ICD-10-CM | POA: Diagnosis not present

## 2019-07-15 DIAGNOSIS — R262 Difficulty in walking, not elsewhere classified: Secondary | ICD-10-CM | POA: Diagnosis not present

## 2019-07-15 DIAGNOSIS — Z9181 History of falling: Secondary | ICD-10-CM | POA: Diagnosis not present

## 2019-07-15 DIAGNOSIS — R260 Ataxic gait: Secondary | ICD-10-CM | POA: Diagnosis not present

## 2019-07-15 DIAGNOSIS — R296 Repeated falls: Secondary | ICD-10-CM | POA: Diagnosis not present

## 2019-07-17 DIAGNOSIS — R260 Ataxic gait: Secondary | ICD-10-CM | POA: Diagnosis not present

## 2019-07-17 DIAGNOSIS — R262 Difficulty in walking, not elsewhere classified: Secondary | ICD-10-CM | POA: Diagnosis not present

## 2019-07-17 DIAGNOSIS — R296 Repeated falls: Secondary | ICD-10-CM | POA: Diagnosis not present

## 2019-07-17 DIAGNOSIS — Z9181 History of falling: Secondary | ICD-10-CM | POA: Diagnosis not present

## 2019-07-20 DIAGNOSIS — R260 Ataxic gait: Secondary | ICD-10-CM | POA: Diagnosis not present

## 2019-07-20 DIAGNOSIS — Z9181 History of falling: Secondary | ICD-10-CM | POA: Diagnosis not present

## 2019-07-20 DIAGNOSIS — R262 Difficulty in walking, not elsewhere classified: Secondary | ICD-10-CM | POA: Diagnosis not present

## 2019-07-20 DIAGNOSIS — R296 Repeated falls: Secondary | ICD-10-CM | POA: Diagnosis not present

## 2019-07-22 DIAGNOSIS — Z9181 History of falling: Secondary | ICD-10-CM | POA: Diagnosis not present

## 2019-07-22 DIAGNOSIS — R262 Difficulty in walking, not elsewhere classified: Secondary | ICD-10-CM | POA: Diagnosis not present

## 2019-07-22 DIAGNOSIS — R296 Repeated falls: Secondary | ICD-10-CM | POA: Diagnosis not present

## 2019-07-22 DIAGNOSIS — R260 Ataxic gait: Secondary | ICD-10-CM | POA: Diagnosis not present

## 2019-07-24 DIAGNOSIS — R296 Repeated falls: Secondary | ICD-10-CM | POA: Diagnosis not present

## 2019-07-24 DIAGNOSIS — R262 Difficulty in walking, not elsewhere classified: Secondary | ICD-10-CM | POA: Diagnosis not present

## 2019-07-24 DIAGNOSIS — Z9181 History of falling: Secondary | ICD-10-CM | POA: Diagnosis not present

## 2019-07-24 DIAGNOSIS — R260 Ataxic gait: Secondary | ICD-10-CM | POA: Diagnosis not present

## 2019-07-27 DIAGNOSIS — R262 Difficulty in walking, not elsewhere classified: Secondary | ICD-10-CM | POA: Diagnosis not present

## 2019-07-27 DIAGNOSIS — Z9181 History of falling: Secondary | ICD-10-CM | POA: Diagnosis not present

## 2019-07-27 DIAGNOSIS — R260 Ataxic gait: Secondary | ICD-10-CM | POA: Diagnosis not present

## 2019-07-27 DIAGNOSIS — R296 Repeated falls: Secondary | ICD-10-CM | POA: Diagnosis not present

## 2019-07-29 DIAGNOSIS — R262 Difficulty in walking, not elsewhere classified: Secondary | ICD-10-CM | POA: Diagnosis not present

## 2019-07-29 DIAGNOSIS — R296 Repeated falls: Secondary | ICD-10-CM | POA: Diagnosis not present

## 2019-07-29 DIAGNOSIS — R260 Ataxic gait: Secondary | ICD-10-CM | POA: Diagnosis not present

## 2019-07-29 DIAGNOSIS — Z9181 History of falling: Secondary | ICD-10-CM | POA: Diagnosis not present

## 2019-07-31 DIAGNOSIS — R260 Ataxic gait: Secondary | ICD-10-CM | POA: Diagnosis not present

## 2019-07-31 DIAGNOSIS — R296 Repeated falls: Secondary | ICD-10-CM | POA: Diagnosis not present

## 2019-07-31 DIAGNOSIS — Z9181 History of falling: Secondary | ICD-10-CM | POA: Diagnosis not present

## 2019-07-31 DIAGNOSIS — R262 Difficulty in walking, not elsewhere classified: Secondary | ICD-10-CM | POA: Diagnosis not present

## 2019-08-03 DIAGNOSIS — Z9181 History of falling: Secondary | ICD-10-CM | POA: Diagnosis not present

## 2019-08-03 DIAGNOSIS — R262 Difficulty in walking, not elsewhere classified: Secondary | ICD-10-CM | POA: Diagnosis not present

## 2019-08-03 DIAGNOSIS — R296 Repeated falls: Secondary | ICD-10-CM | POA: Diagnosis not present

## 2019-08-03 DIAGNOSIS — R260 Ataxic gait: Secondary | ICD-10-CM | POA: Diagnosis not present

## 2019-08-05 DIAGNOSIS — Z9181 History of falling: Secondary | ICD-10-CM | POA: Diagnosis not present

## 2019-08-05 DIAGNOSIS — R262 Difficulty in walking, not elsewhere classified: Secondary | ICD-10-CM | POA: Diagnosis not present

## 2019-08-05 DIAGNOSIS — R260 Ataxic gait: Secondary | ICD-10-CM | POA: Diagnosis not present

## 2019-08-05 DIAGNOSIS — R296 Repeated falls: Secondary | ICD-10-CM | POA: Diagnosis not present

## 2019-08-07 DIAGNOSIS — R260 Ataxic gait: Secondary | ICD-10-CM | POA: Diagnosis not present

## 2019-08-07 DIAGNOSIS — R296 Repeated falls: Secondary | ICD-10-CM | POA: Diagnosis not present

## 2019-08-07 DIAGNOSIS — R262 Difficulty in walking, not elsewhere classified: Secondary | ICD-10-CM | POA: Diagnosis not present

## 2019-08-07 DIAGNOSIS — Z9181 History of falling: Secondary | ICD-10-CM | POA: Diagnosis not present

## 2019-08-10 DIAGNOSIS — Z9181 History of falling: Secondary | ICD-10-CM | POA: Diagnosis not present

## 2019-08-10 DIAGNOSIS — R262 Difficulty in walking, not elsewhere classified: Secondary | ICD-10-CM | POA: Diagnosis not present

## 2019-08-10 DIAGNOSIS — R296 Repeated falls: Secondary | ICD-10-CM | POA: Diagnosis not present

## 2019-08-10 DIAGNOSIS — R260 Ataxic gait: Secondary | ICD-10-CM | POA: Diagnosis not present

## 2019-08-11 ENCOUNTER — Encounter: Payer: Self-pay | Admitting: Vascular Surgery

## 2019-08-11 ENCOUNTER — Other Ambulatory Visit: Payer: Self-pay

## 2019-08-11 ENCOUNTER — Ambulatory Visit (INDEPENDENT_AMBULATORY_CARE_PROVIDER_SITE_OTHER): Payer: Medicare Other | Admitting: Vascular Surgery

## 2019-08-11 VITALS — BP 110/67 | HR 68 | Temp 97.7°F | Resp 18 | Ht 64.0 in | Wt 135.0 lb

## 2019-08-11 DIAGNOSIS — Z89511 Acquired absence of right leg below knee: Secondary | ICD-10-CM | POA: Diagnosis not present

## 2019-08-11 DIAGNOSIS — M21372 Foot drop, left foot: Secondary | ICD-10-CM | POA: Diagnosis not present

## 2019-08-11 NOTE — Progress Notes (Signed)
[facility] [place]  Patient name: Natasha Frederick MRN: [id number] DOB: April 30, 1955 Sex: female  REASON FOR VISIT: Follow-up right below-knee amputation and left leg arterial thrombectomy  HPI: Natasha Frederick is a 64 y.o. female here for follow-up.  She is here today with her husband.  She continues to make slow improvement but has very difficult time related to multiple issues.  She has a baseline difficulty with her Huntington's disease.  She had a very complicated medical course 1 year ago with perforated viscus and critical ischemia both lower extremities.  This was recognized late in her right leg was nonviable at presentation and she had complete foot drop despite revascularization and fasciotomies on her left leg.  She eventually healed a right below-knee amputation.  She continues to work with physical therapy.  She reports that she is able to transfer from bed to chair and walk a slight amount with a walker  Past Medical History:  Diagnosis Date  . Acute embolism and thrombosis of deep vein of both distal lower extremities (HCC)   . Arthritis    hip  . C. difficile diarrhea   . Endometriosis   . Huntington disease ([place])   . Peptic ulcer   . Perforated gastric ulcer ([place])   . Scoliosis     Family History  Problem Relation Age of Onset  . Lung cancer Mother   . Stroke Father   . Huntington's disease Paternal Grandmother   . Huntington's disease Paternal Aunt   . Huntington's disease Paternal Uncle   . Arthritis Neg Hx        family hx  . Cancer Neg Hx        prostate ca -family hx  . Heart disease Neg Hx        family hx  . Colon cancer Neg Hx   . Esophageal cancer Neg Hx   . Pancreatic cancer Neg Hx   . Liver disease Neg Hx     SOCIAL HISTORY: Social History   Tobacco Use  . Smoking status: Former Smoker    Packs/day: 1.00    Years: 8.00    Pack years: 8.00    Types: Cigarettes    Quit date: 01/17/1994   Years since quitting: 25.5  . Smokeless tobacco: Never Used  Substance Use Topics  . Alcohol use: Yes    Alcohol/week: 3.0 standard drinks    Types: 3 Cans of beer per week    Comment: per week    Allergies  Allergen Reactions  . Bee Venom Anaphylaxis and Swelling  . Nsaids Other (See Comments)    PERFORATED ULCER = NO MORE NSAIDs  . Adhesive [Tape] Other (See Comments)    redness  . Codeine Nausea And Vomiting  . Penicillins     Did it involve swelling of the face/tongue/throat, SOB, or low BP? Yes Did it involve sudden or severe rash/hives, skin peeling, or any reaction on the inside of your mouth or nose? Unknown Did you need to seek medical attention at a hospital or doctor's office? Yes When did it last happen?age 14-40 If all above answers are "NO", may proceed with cephalosporin use.   . Sulfa Antibiotics Itching    Current Outpatient Medications  Medication Sig Dispense Refill  . acetaminophen (TYLENOL) 500 MG tablet Take 1,000 mg by mouth every morning.    . clindamycin (CLEOCIN) 150 MG capsule Dental Appt only    . Deutetrabenazine (AUSTEDO) 12 MG TABS Take 24 mg  by mouth 2 (two) times daily. 360 tablet 1  . oxyCODONE-acetaminophen (PERCOCET/ROXICET) 5-325 MG tablet Take 1 tablet by mouth every 8 (eight) hours as needed for severe pain. 60 tablet 0  . sennosides-docusate sodium (SENOKOT-S) 8.6-50 MG tablet Take 1 tablet by mouth daily.    . sertraline (ZOLOFT) 100 MG tablet Take 1 tablet (100 mg total) by mouth daily. 90 tablet 3   No current facility-administered medications for this visit.    REVIEW OF SYSTEMS:  [X]  denotes positive finding, [ ]  denotes negative finding Cardiac  Comments:  Chest pain or chest pressure:    Shortness of breath upon exertion:    Short of breath when lying flat:    Irregular heart rhythm:        Vascular    Pain in calf, thigh, or hip brought on by ambulation:    Pain in feet at night that wakes you up from your sleep:      Blood clot in your veins:    Leg swelling:           PHYSICAL EXAM: Vitals:   08/11/19 1247  BP: 110/67  Pulse: 68  Resp: 18  Temp: 97.7 F (36.5 C)  TempSrc: Temporal  SpO2: 95%  Weight: 135 lb (61.2 kg)  Height: 5\' 4"  (1.626 m)    GENERAL: The patient is a well-nourished female, in no acute distress. The vital signs are documented above. CARDIOVASCULAR: Right below-knee prosthesis in place.  Left leg with no evidence of tissue loss.  She does have an easily palpable dorsalis pedis and posterior tibial pulse.  Her fasciotomy incisions are well-healed.  She does have a complete foot drop PULMONARY: There is good air exchange  MUSCULOSKELETAL: There are no major deformities or cyanosis. NEUROLOGIC: No focal weakness or paresthesias are detected. SKIN: There are no ulcers or rashes noted. PSYCHIATRIC: The patient has a normal affect.  DATA:  None  MEDICAL ISSUES: Again had a long discussion with the patient and her husband.  Hopefully she can continue to make progress despite her generalized movement disorder in addition to right below-knee amputation and left foot drop.  She is questioning phantom pain.  It appears that this is not severe to her but she has this occasionally and I explained that she treat this pain the same as she would for other pain issues.  She gets good relief with Tylenol.  She is requested a prescription for a replacement rubber socket in her BK prosthesis and we have written this.  She will continue her physical therapy and see again as needed    10/11/19, MD [facility] Tel [phone] Pager [phone]

## 2019-08-12 DIAGNOSIS — R262 Difficulty in walking, not elsewhere classified: Secondary | ICD-10-CM | POA: Diagnosis not present

## 2019-08-12 DIAGNOSIS — Z9181 History of falling: Secondary | ICD-10-CM | POA: Diagnosis not present

## 2019-08-12 DIAGNOSIS — R296 Repeated falls: Secondary | ICD-10-CM | POA: Diagnosis not present

## 2019-08-12 DIAGNOSIS — R260 Ataxic gait: Secondary | ICD-10-CM | POA: Diagnosis not present

## 2019-08-14 DIAGNOSIS — Z9181 History of falling: Secondary | ICD-10-CM | POA: Diagnosis not present

## 2019-08-14 DIAGNOSIS — R296 Repeated falls: Secondary | ICD-10-CM | POA: Diagnosis not present

## 2019-08-14 DIAGNOSIS — R260 Ataxic gait: Secondary | ICD-10-CM | POA: Diagnosis not present

## 2019-08-14 DIAGNOSIS — R262 Difficulty in walking, not elsewhere classified: Secondary | ICD-10-CM | POA: Diagnosis not present

## 2019-08-17 DIAGNOSIS — Z9181 History of falling: Secondary | ICD-10-CM | POA: Diagnosis not present

## 2019-08-17 DIAGNOSIS — R260 Ataxic gait: Secondary | ICD-10-CM | POA: Diagnosis not present

## 2019-08-17 DIAGNOSIS — R296 Repeated falls: Secondary | ICD-10-CM | POA: Diagnosis not present

## 2019-08-17 DIAGNOSIS — R262 Difficulty in walking, not elsewhere classified: Secondary | ICD-10-CM | POA: Diagnosis not present

## 2019-08-19 DIAGNOSIS — R262 Difficulty in walking, not elsewhere classified: Secondary | ICD-10-CM | POA: Diagnosis not present

## 2019-08-19 DIAGNOSIS — R260 Ataxic gait: Secondary | ICD-10-CM | POA: Diagnosis not present

## 2019-08-19 DIAGNOSIS — Z9181 History of falling: Secondary | ICD-10-CM | POA: Diagnosis not present

## 2019-08-19 DIAGNOSIS — R296 Repeated falls: Secondary | ICD-10-CM | POA: Diagnosis not present

## 2019-08-21 DIAGNOSIS — R262 Difficulty in walking, not elsewhere classified: Secondary | ICD-10-CM | POA: Diagnosis not present

## 2019-08-21 DIAGNOSIS — R296 Repeated falls: Secondary | ICD-10-CM | POA: Diagnosis not present

## 2019-08-21 DIAGNOSIS — Z9181 History of falling: Secondary | ICD-10-CM | POA: Diagnosis not present

## 2019-08-21 DIAGNOSIS — R260 Ataxic gait: Secondary | ICD-10-CM | POA: Diagnosis not present

## 2019-08-24 DIAGNOSIS — R296 Repeated falls: Secondary | ICD-10-CM | POA: Diagnosis not present

## 2019-08-24 DIAGNOSIS — R262 Difficulty in walking, not elsewhere classified: Secondary | ICD-10-CM | POA: Diagnosis not present

## 2019-08-24 DIAGNOSIS — R260 Ataxic gait: Secondary | ICD-10-CM | POA: Diagnosis not present

## 2019-08-24 DIAGNOSIS — Z9181 History of falling: Secondary | ICD-10-CM | POA: Diagnosis not present

## 2019-08-26 DIAGNOSIS — R296 Repeated falls: Secondary | ICD-10-CM | POA: Diagnosis not present

## 2019-08-26 DIAGNOSIS — R262 Difficulty in walking, not elsewhere classified: Secondary | ICD-10-CM | POA: Diagnosis not present

## 2019-08-26 DIAGNOSIS — R260 Ataxic gait: Secondary | ICD-10-CM | POA: Diagnosis not present

## 2019-08-26 DIAGNOSIS — Z9181 History of falling: Secondary | ICD-10-CM | POA: Diagnosis not present

## 2019-08-28 DIAGNOSIS — Z9181 History of falling: Secondary | ICD-10-CM | POA: Diagnosis not present

## 2019-08-28 DIAGNOSIS — R260 Ataxic gait: Secondary | ICD-10-CM | POA: Diagnosis not present

## 2019-08-28 DIAGNOSIS — R296 Repeated falls: Secondary | ICD-10-CM | POA: Diagnosis not present

## 2019-08-28 DIAGNOSIS — R262 Difficulty in walking, not elsewhere classified: Secondary | ICD-10-CM | POA: Diagnosis not present

## 2019-09-02 DIAGNOSIS — R260 Ataxic gait: Secondary | ICD-10-CM | POA: Diagnosis not present

## 2019-09-02 DIAGNOSIS — R296 Repeated falls: Secondary | ICD-10-CM | POA: Diagnosis not present

## 2019-09-02 DIAGNOSIS — R262 Difficulty in walking, not elsewhere classified: Secondary | ICD-10-CM | POA: Diagnosis not present

## 2019-09-02 DIAGNOSIS — Z9181 History of falling: Secondary | ICD-10-CM | POA: Diagnosis not present

## 2019-09-04 DIAGNOSIS — Z9181 History of falling: Secondary | ICD-10-CM | POA: Diagnosis not present

## 2019-09-04 DIAGNOSIS — R262 Difficulty in walking, not elsewhere classified: Secondary | ICD-10-CM | POA: Diagnosis not present

## 2019-09-04 DIAGNOSIS — R296 Repeated falls: Secondary | ICD-10-CM | POA: Diagnosis not present

## 2019-09-04 DIAGNOSIS — R260 Ataxic gait: Secondary | ICD-10-CM | POA: Diagnosis not present

## 2019-09-09 DIAGNOSIS — R260 Ataxic gait: Secondary | ICD-10-CM | POA: Diagnosis not present

## 2019-09-09 DIAGNOSIS — Z9181 History of falling: Secondary | ICD-10-CM | POA: Diagnosis not present

## 2019-09-09 DIAGNOSIS — R296 Repeated falls: Secondary | ICD-10-CM | POA: Diagnosis not present

## 2019-09-09 DIAGNOSIS — R262 Difficulty in walking, not elsewhere classified: Secondary | ICD-10-CM | POA: Diagnosis not present

## 2019-09-11 DIAGNOSIS — R262 Difficulty in walking, not elsewhere classified: Secondary | ICD-10-CM | POA: Diagnosis not present

## 2019-09-11 DIAGNOSIS — R260 Ataxic gait: Secondary | ICD-10-CM | POA: Diagnosis not present

## 2019-09-11 DIAGNOSIS — Z9181 History of falling: Secondary | ICD-10-CM | POA: Diagnosis not present

## 2019-09-11 DIAGNOSIS — R296 Repeated falls: Secondary | ICD-10-CM | POA: Diagnosis not present

## 2019-09-14 DIAGNOSIS — R262 Difficulty in walking, not elsewhere classified: Secondary | ICD-10-CM | POA: Diagnosis not present

## 2019-09-14 DIAGNOSIS — Z9181 History of falling: Secondary | ICD-10-CM | POA: Diagnosis not present

## 2019-09-14 DIAGNOSIS — R260 Ataxic gait: Secondary | ICD-10-CM | POA: Diagnosis not present

## 2019-09-14 DIAGNOSIS — R296 Repeated falls: Secondary | ICD-10-CM | POA: Diagnosis not present

## 2019-09-16 DIAGNOSIS — R262 Difficulty in walking, not elsewhere classified: Secondary | ICD-10-CM | POA: Diagnosis not present

## 2019-09-16 DIAGNOSIS — R260 Ataxic gait: Secondary | ICD-10-CM | POA: Diagnosis not present

## 2019-09-16 DIAGNOSIS — Z9181 History of falling: Secondary | ICD-10-CM | POA: Diagnosis not present

## 2019-09-16 DIAGNOSIS — R296 Repeated falls: Secondary | ICD-10-CM | POA: Diagnosis not present

## 2019-09-18 DIAGNOSIS — R262 Difficulty in walking, not elsewhere classified: Secondary | ICD-10-CM | POA: Diagnosis not present

## 2019-09-18 DIAGNOSIS — Z9181 History of falling: Secondary | ICD-10-CM | POA: Diagnosis not present

## 2019-09-18 DIAGNOSIS — R296 Repeated falls: Secondary | ICD-10-CM | POA: Diagnosis not present

## 2019-09-18 DIAGNOSIS — R260 Ataxic gait: Secondary | ICD-10-CM | POA: Diagnosis not present

## 2019-09-21 ENCOUNTER — Telehealth: Payer: Self-pay | Admitting: Family Medicine

## 2019-09-21 DIAGNOSIS — G10 Huntington's disease: Secondary | ICD-10-CM

## 2019-09-21 DIAGNOSIS — R262 Difficulty in walking, not elsewhere classified: Secondary | ICD-10-CM | POA: Diagnosis not present

## 2019-09-21 DIAGNOSIS — R260 Ataxic gait: Secondary | ICD-10-CM | POA: Diagnosis not present

## 2019-09-21 DIAGNOSIS — Z9181 History of falling: Secondary | ICD-10-CM | POA: Diagnosis not present

## 2019-09-21 DIAGNOSIS — R296 Repeated falls: Secondary | ICD-10-CM | POA: Diagnosis not present

## 2019-09-21 MED ORDER — OXYCODONE-ACETAMINOPHEN 5-325 MG PO TABS: 1.0000 | ORAL_TABLET | Freq: Three times a day (TID) | ORAL | 0 refills | Status: DC | PRN

## 2019-09-21 NOTE — Telephone Encounter (Signed)
Husband was in office today and he requests referral to [facility] physical [facility] rehabilitation.  She has Huntington's chorea.  We will go ahead with referral.  She also had requested refills of oxycodone.  She has had previous amputation right lower extremity which was below-knee.  She has had some chronic pains of her back and last year had perforated gastric ulcer taking over-the-counter nonsteroidals.

## 2019-09-25 DIAGNOSIS — Z9181 History of falling: Secondary | ICD-10-CM | POA: Diagnosis not present

## 2019-09-25 DIAGNOSIS — R260 Ataxic gait: Secondary | ICD-10-CM | POA: Diagnosis not present

## 2019-09-25 DIAGNOSIS — R296 Repeated falls: Secondary | ICD-10-CM | POA: Diagnosis not present

## 2019-09-25 DIAGNOSIS — R262 Difficulty in walking, not elsewhere classified: Secondary | ICD-10-CM | POA: Diagnosis not present

## 2019-10-01 ENCOUNTER — Telehealth: Payer: Self-pay | Admitting: Family Medicine

## 2019-10-01 NOTE — Telephone Encounter (Signed)
Husband states he gave Dr. Caryl Never information on a place for patient to do Rehab, however he hasn't heard anything.  Patient's husband Melvyn Neth) is requesting a call back.

## 2019-10-02 NOTE — Telephone Encounter (Signed)
(  [phone] was given to lewis Vejar to call for referral appt

## 2019-10-14 ENCOUNTER — Ambulatory Visit: Payer: Medicare Other | Admitting: Physical Therapy

## 2019-10-26 DIAGNOSIS — Z9181 History of falling: Secondary | ICD-10-CM | POA: Diagnosis not present

## 2019-10-26 DIAGNOSIS — R296 Repeated falls: Secondary | ICD-10-CM | POA: Diagnosis not present

## 2019-10-26 DIAGNOSIS — R260 Ataxic gait: Secondary | ICD-10-CM | POA: Diagnosis not present

## 2019-10-26 DIAGNOSIS — R262 Difficulty in walking, not elsewhere classified: Secondary | ICD-10-CM | POA: Diagnosis not present

## 2019-10-28 DIAGNOSIS — R262 Difficulty in walking, not elsewhere classified: Secondary | ICD-10-CM | POA: Diagnosis not present

## 2019-10-28 DIAGNOSIS — Z9181 History of falling: Secondary | ICD-10-CM | POA: Diagnosis not present

## 2019-10-28 DIAGNOSIS — R296 Repeated falls: Secondary | ICD-10-CM | POA: Diagnosis not present

## 2019-10-28 DIAGNOSIS — R260 Ataxic gait: Secondary | ICD-10-CM | POA: Diagnosis not present

## 2019-11-02 DIAGNOSIS — R296 Repeated falls: Secondary | ICD-10-CM | POA: Diagnosis not present

## 2019-11-02 DIAGNOSIS — R262 Difficulty in walking, not elsewhere classified: Secondary | ICD-10-CM | POA: Diagnosis not present

## 2019-11-02 DIAGNOSIS — R260 Ataxic gait: Secondary | ICD-10-CM | POA: Diagnosis not present

## 2019-11-02 DIAGNOSIS — Z9181 History of falling: Secondary | ICD-10-CM | POA: Diagnosis not present

## 2019-11-04 DIAGNOSIS — Z9181 History of falling: Secondary | ICD-10-CM | POA: Diagnosis not present

## 2019-11-04 DIAGNOSIS — R262 Difficulty in walking, not elsewhere classified: Secondary | ICD-10-CM | POA: Diagnosis not present

## 2019-11-04 DIAGNOSIS — R260 Ataxic gait: Secondary | ICD-10-CM | POA: Diagnosis not present

## 2019-11-04 DIAGNOSIS — R296 Repeated falls: Secondary | ICD-10-CM | POA: Diagnosis not present

## 2019-11-06 DIAGNOSIS — R262 Difficulty in walking, not elsewhere classified: Secondary | ICD-10-CM | POA: Diagnosis not present

## 2019-11-06 DIAGNOSIS — Z9181 History of falling: Secondary | ICD-10-CM | POA: Diagnosis not present

## 2019-11-06 DIAGNOSIS — R260 Ataxic gait: Secondary | ICD-10-CM | POA: Diagnosis not present

## 2019-11-06 DIAGNOSIS — R296 Repeated falls: Secondary | ICD-10-CM | POA: Diagnosis not present

## 2019-11-09 DIAGNOSIS — R262 Difficulty in walking, not elsewhere classified: Secondary | ICD-10-CM | POA: Diagnosis not present

## 2019-11-09 DIAGNOSIS — R260 Ataxic gait: Secondary | ICD-10-CM | POA: Diagnosis not present

## 2019-11-09 DIAGNOSIS — R296 Repeated falls: Secondary | ICD-10-CM | POA: Diagnosis not present

## 2019-11-09 DIAGNOSIS — Z9181 History of falling: Secondary | ICD-10-CM | POA: Diagnosis not present

## 2019-11-11 DIAGNOSIS — R262 Difficulty in walking, not elsewhere classified: Secondary | ICD-10-CM | POA: Diagnosis not present

## 2019-11-11 DIAGNOSIS — R260 Ataxic gait: Secondary | ICD-10-CM | POA: Diagnosis not present

## 2019-11-11 DIAGNOSIS — R296 Repeated falls: Secondary | ICD-10-CM | POA: Diagnosis not present

## 2019-11-11 DIAGNOSIS — Z9181 History of falling: Secondary | ICD-10-CM | POA: Diagnosis not present

## 2019-11-13 DIAGNOSIS — R260 Ataxic gait: Secondary | ICD-10-CM | POA: Diagnosis not present

## 2019-11-13 DIAGNOSIS — R262 Difficulty in walking, not elsewhere classified: Secondary | ICD-10-CM | POA: Diagnosis not present

## 2019-11-13 DIAGNOSIS — R296 Repeated falls: Secondary | ICD-10-CM | POA: Diagnosis not present

## 2019-11-13 DIAGNOSIS — Z9181 History of falling: Secondary | ICD-10-CM | POA: Diagnosis not present

## 2019-11-16 ENCOUNTER — Other Ambulatory Visit: Payer: Self-pay | Admitting: Family Medicine

## 2019-11-16 DIAGNOSIS — R260 Ataxic gait: Secondary | ICD-10-CM | POA: Diagnosis not present

## 2019-11-16 DIAGNOSIS — Z9181 History of falling: Secondary | ICD-10-CM | POA: Diagnosis not present

## 2019-11-16 DIAGNOSIS — R262 Difficulty in walking, not elsewhere classified: Secondary | ICD-10-CM | POA: Diagnosis not present

## 2019-11-16 DIAGNOSIS — R296 Repeated falls: Secondary | ICD-10-CM | POA: Diagnosis not present

## 2019-11-16 MED ORDER — OXYCODONE-ACETAMINOPHEN 5-325 MG PO TABS: 1.0000 | ORAL_TABLET | Freq: Three times a day (TID) | ORAL | 0 refills | Status: DC | PRN

## 2019-11-16 NOTE — Telephone Encounter (Signed)
Pt husband call and stated pt need a refill on oxyCODONE-acetaminophen (PERCOCET/ROXICET) 5-325 MG tablet sent to  [id number] - [place], [place] - [address] Phone:  [phone]  Fax:  [phone]

## 2019-11-18 DIAGNOSIS — R262 Difficulty in walking, not elsewhere classified: Secondary | ICD-10-CM | POA: Diagnosis not present

## 2019-11-18 DIAGNOSIS — R296 Repeated falls: Secondary | ICD-10-CM | POA: Diagnosis not present

## 2019-11-18 DIAGNOSIS — Z9181 History of falling: Secondary | ICD-10-CM | POA: Diagnosis not present

## 2019-11-18 DIAGNOSIS — R260 Ataxic gait: Secondary | ICD-10-CM | POA: Diagnosis not present

## 2019-11-20 DIAGNOSIS — R260 Ataxic gait: Secondary | ICD-10-CM | POA: Diagnosis not present

## 2019-11-20 DIAGNOSIS — R296 Repeated falls: Secondary | ICD-10-CM | POA: Diagnosis not present

## 2019-11-20 DIAGNOSIS — Z9181 History of falling: Secondary | ICD-10-CM | POA: Diagnosis not present

## 2019-11-20 DIAGNOSIS — R262 Difficulty in walking, not elsewhere classified: Secondary | ICD-10-CM | POA: Diagnosis not present

## 2019-11-23 DIAGNOSIS — R262 Difficulty in walking, not elsewhere classified: Secondary | ICD-10-CM | POA: Diagnosis not present

## 2019-11-23 DIAGNOSIS — R260 Ataxic gait: Secondary | ICD-10-CM | POA: Diagnosis not present

## 2019-11-23 DIAGNOSIS — Z9181 History of falling: Secondary | ICD-10-CM | POA: Diagnosis not present

## 2019-11-23 DIAGNOSIS — R296 Repeated falls: Secondary | ICD-10-CM | POA: Diagnosis not present

## 2019-11-25 DIAGNOSIS — R262 Difficulty in walking, not elsewhere classified: Secondary | ICD-10-CM | POA: Diagnosis not present

## 2019-11-25 DIAGNOSIS — Z9181 History of falling: Secondary | ICD-10-CM | POA: Diagnosis not present

## 2019-11-25 DIAGNOSIS — R260 Ataxic gait: Secondary | ICD-10-CM | POA: Diagnosis not present

## 2019-11-25 DIAGNOSIS — R296 Repeated falls: Secondary | ICD-10-CM | POA: Diagnosis not present

## 2019-11-27 DIAGNOSIS — R296 Repeated falls: Secondary | ICD-10-CM | POA: Diagnosis not present

## 2019-11-27 DIAGNOSIS — R260 Ataxic gait: Secondary | ICD-10-CM | POA: Diagnosis not present

## 2019-11-27 DIAGNOSIS — Z9181 History of falling: Secondary | ICD-10-CM | POA: Diagnosis not present

## 2019-11-27 DIAGNOSIS — R262 Difficulty in walking, not elsewhere classified: Secondary | ICD-10-CM | POA: Diagnosis not present

## 2019-11-30 DIAGNOSIS — R262 Difficulty in walking, not elsewhere classified: Secondary | ICD-10-CM | POA: Diagnosis not present

## 2019-11-30 DIAGNOSIS — Z9181 History of falling: Secondary | ICD-10-CM | POA: Diagnosis not present

## 2019-11-30 DIAGNOSIS — R296 Repeated falls: Secondary | ICD-10-CM | POA: Diagnosis not present

## 2019-11-30 DIAGNOSIS — R260 Ataxic gait: Secondary | ICD-10-CM | POA: Diagnosis not present

## 2019-12-02 DIAGNOSIS — R296 Repeated falls: Secondary | ICD-10-CM | POA: Diagnosis not present

## 2019-12-02 DIAGNOSIS — R260 Ataxic gait: Secondary | ICD-10-CM | POA: Diagnosis not present

## 2019-12-02 DIAGNOSIS — R262 Difficulty in walking, not elsewhere classified: Secondary | ICD-10-CM | POA: Diagnosis not present

## 2019-12-02 DIAGNOSIS — Z9181 History of falling: Secondary | ICD-10-CM | POA: Diagnosis not present

## 2019-12-07 DIAGNOSIS — R296 Repeated falls: Secondary | ICD-10-CM | POA: Diagnosis not present

## 2019-12-07 DIAGNOSIS — Z9181 History of falling: Secondary | ICD-10-CM | POA: Diagnosis not present

## 2019-12-07 DIAGNOSIS — R260 Ataxic gait: Secondary | ICD-10-CM | POA: Diagnosis not present

## 2019-12-07 DIAGNOSIS — R262 Difficulty in walking, not elsewhere classified: Secondary | ICD-10-CM | POA: Diagnosis not present

## 2019-12-09 DIAGNOSIS — R262 Difficulty in walking, not elsewhere classified: Secondary | ICD-10-CM | POA: Diagnosis not present

## 2019-12-09 DIAGNOSIS — R296 Repeated falls: Secondary | ICD-10-CM | POA: Diagnosis not present

## 2019-12-09 DIAGNOSIS — R260 Ataxic gait: Secondary | ICD-10-CM | POA: Diagnosis not present

## 2019-12-09 DIAGNOSIS — Z9181 History of falling: Secondary | ICD-10-CM | POA: Diagnosis not present

## 2019-12-11 DIAGNOSIS — R296 Repeated falls: Secondary | ICD-10-CM | POA: Diagnosis not present

## 2019-12-11 DIAGNOSIS — R262 Difficulty in walking, not elsewhere classified: Secondary | ICD-10-CM | POA: Diagnosis not present

## 2019-12-11 DIAGNOSIS — Z9181 History of falling: Secondary | ICD-10-CM | POA: Diagnosis not present

## 2019-12-11 DIAGNOSIS — R260 Ataxic gait: Secondary | ICD-10-CM | POA: Diagnosis not present

## 2019-12-16 DIAGNOSIS — Z9181 History of falling: Secondary | ICD-10-CM | POA: Diagnosis not present

## 2019-12-16 DIAGNOSIS — R260 Ataxic gait: Secondary | ICD-10-CM | POA: Diagnosis not present

## 2019-12-16 DIAGNOSIS — R296 Repeated falls: Secondary | ICD-10-CM | POA: Diagnosis not present

## 2019-12-16 DIAGNOSIS — R262 Difficulty in walking, not elsewhere classified: Secondary | ICD-10-CM | POA: Diagnosis not present

## 2019-12-18 DIAGNOSIS — R262 Difficulty in walking, not elsewhere classified: Secondary | ICD-10-CM | POA: Diagnosis not present

## 2019-12-18 DIAGNOSIS — Z9181 History of falling: Secondary | ICD-10-CM | POA: Diagnosis not present

## 2019-12-18 DIAGNOSIS — R260 Ataxic gait: Secondary | ICD-10-CM | POA: Diagnosis not present

## 2019-12-18 DIAGNOSIS — R296 Repeated falls: Secondary | ICD-10-CM | POA: Diagnosis not present

## 2019-12-21 DIAGNOSIS — R260 Ataxic gait: Secondary | ICD-10-CM | POA: Diagnosis not present

## 2019-12-21 DIAGNOSIS — R262 Difficulty in walking, not elsewhere classified: Secondary | ICD-10-CM | POA: Diagnosis not present

## 2019-12-21 DIAGNOSIS — Z9181 History of falling: Secondary | ICD-10-CM | POA: Diagnosis not present

## 2019-12-21 DIAGNOSIS — R296 Repeated falls: Secondary | ICD-10-CM | POA: Diagnosis not present

## 2019-12-23 DIAGNOSIS — R262 Difficulty in walking, not elsewhere classified: Secondary | ICD-10-CM | POA: Diagnosis not present

## 2019-12-23 DIAGNOSIS — Z9181 History of falling: Secondary | ICD-10-CM | POA: Diagnosis not present

## 2019-12-23 DIAGNOSIS — R260 Ataxic gait: Secondary | ICD-10-CM | POA: Diagnosis not present

## 2019-12-23 DIAGNOSIS — R296 Repeated falls: Secondary | ICD-10-CM | POA: Diagnosis not present

## 2019-12-28 DIAGNOSIS — Z9181 History of falling: Secondary | ICD-10-CM | POA: Diagnosis not present

## 2019-12-28 DIAGNOSIS — R260 Ataxic gait: Secondary | ICD-10-CM | POA: Diagnosis not present

## 2019-12-28 DIAGNOSIS — R262 Difficulty in walking, not elsewhere classified: Secondary | ICD-10-CM | POA: Diagnosis not present

## 2019-12-28 DIAGNOSIS — R296 Repeated falls: Secondary | ICD-10-CM | POA: Diagnosis not present

## 2019-12-30 DIAGNOSIS — R296 Repeated falls: Secondary | ICD-10-CM | POA: Diagnosis not present

## 2019-12-30 DIAGNOSIS — R262 Difficulty in walking, not elsewhere classified: Secondary | ICD-10-CM | POA: Diagnosis not present

## 2019-12-30 DIAGNOSIS — R260 Ataxic gait: Secondary | ICD-10-CM | POA: Diagnosis not present

## 2019-12-30 DIAGNOSIS — Z9181 History of falling: Secondary | ICD-10-CM | POA: Diagnosis not present

## 2020-01-11 DIAGNOSIS — Z9181 History of falling: Secondary | ICD-10-CM | POA: Diagnosis not present

## 2020-01-11 DIAGNOSIS — R262 Difficulty in walking, not elsewhere classified: Secondary | ICD-10-CM | POA: Diagnosis not present

## 2020-01-11 DIAGNOSIS — R296 Repeated falls: Secondary | ICD-10-CM | POA: Diagnosis not present

## 2020-01-11 DIAGNOSIS — R260 Ataxic gait: Secondary | ICD-10-CM | POA: Diagnosis not present

## 2020-01-13 DIAGNOSIS — R260 Ataxic gait: Secondary | ICD-10-CM | POA: Diagnosis not present

## 2020-01-13 DIAGNOSIS — Z9181 History of falling: Secondary | ICD-10-CM | POA: Diagnosis not present

## 2020-01-13 DIAGNOSIS — R296 Repeated falls: Secondary | ICD-10-CM | POA: Diagnosis not present

## 2020-01-13 DIAGNOSIS — R262 Difficulty in walking, not elsewhere classified: Secondary | ICD-10-CM | POA: Diagnosis not present

## 2020-01-14 DIAGNOSIS — G8114 Spastic hemiplegia affecting left nondominant side: Secondary | ICD-10-CM | POA: Diagnosis not present

## 2020-01-14 DIAGNOSIS — M24575 Contracture, left foot: Secondary | ICD-10-CM | POA: Diagnosis not present

## 2020-01-14 DIAGNOSIS — M62472 Contracture of muscle, left ankle and foot: Secondary | ICD-10-CM | POA: Diagnosis not present

## 2020-01-14 DIAGNOSIS — M24572 Contracture, left ankle: Secondary | ICD-10-CM | POA: Diagnosis not present

## 2020-01-15 ENCOUNTER — Telehealth: Payer: Self-pay | Admitting: Family Medicine

## 2020-01-15 MED ORDER — OXYCODONE-ACETAMINOPHEN 5-325 MG PO TABS: 1.0000 | ORAL_TABLET | Freq: Three times a day (TID) | ORAL | 0 refills | Status: DC | PRN

## 2020-01-15 NOTE — Telephone Encounter (Signed)
Pt husband call and she need a refill on oxyCODONE-acetaminophen (PERCOCET/ROXICET) 5-325 MG tablet sent to  [facility] #[id number] - [place], [place] - [address] Phone:  [phone]  Fax:  [phone]

## 2020-01-15 NOTE — Addendum Note (Signed)
Addended by: Kristian Covey on: 01/15/2020 01:06 PM   Modules accepted: Orders

## 2020-01-18 DIAGNOSIS — R296 Repeated falls: Secondary | ICD-10-CM | POA: Diagnosis not present

## 2020-01-18 DIAGNOSIS — R260 Ataxic gait: Secondary | ICD-10-CM | POA: Diagnosis not present

## 2020-01-18 DIAGNOSIS — R262 Difficulty in walking, not elsewhere classified: Secondary | ICD-10-CM | POA: Diagnosis not present

## 2020-01-18 DIAGNOSIS — Z9181 History of falling: Secondary | ICD-10-CM | POA: Diagnosis not present

## 2020-01-20 DIAGNOSIS — R262 Difficulty in walking, not elsewhere classified: Secondary | ICD-10-CM | POA: Diagnosis not present

## 2020-01-20 DIAGNOSIS — R296 Repeated falls: Secondary | ICD-10-CM | POA: Diagnosis not present

## 2020-01-20 DIAGNOSIS — R260 Ataxic gait: Secondary | ICD-10-CM | POA: Diagnosis not present

## 2020-01-20 DIAGNOSIS — Z9181 History of falling: Secondary | ICD-10-CM | POA: Diagnosis not present

## 2020-01-22 ENCOUNTER — Telehealth: Payer: Self-pay

## 2020-01-22 DIAGNOSIS — Z9181 History of falling: Secondary | ICD-10-CM | POA: Diagnosis not present

## 2020-01-22 DIAGNOSIS — R296 Repeated falls: Secondary | ICD-10-CM | POA: Diagnosis not present

## 2020-01-22 DIAGNOSIS — R260 Ataxic gait: Secondary | ICD-10-CM | POA: Diagnosis not present

## 2020-01-22 DIAGNOSIS — R262 Difficulty in walking, not elsewhere classified: Secondary | ICD-10-CM | POA: Diagnosis not present

## 2020-01-22 NOTE — Telephone Encounter (Signed)
Progress notes for pt have been faxed back to [facility] [phone]

## 2020-01-25 DIAGNOSIS — R296 Repeated falls: Secondary | ICD-10-CM | POA: Diagnosis not present

## 2020-01-25 DIAGNOSIS — R260 Ataxic gait: Secondary | ICD-10-CM | POA: Diagnosis not present

## 2020-01-25 DIAGNOSIS — R262 Difficulty in walking, not elsewhere classified: Secondary | ICD-10-CM | POA: Diagnosis not present

## 2020-01-25 DIAGNOSIS — Z9181 History of falling: Secondary | ICD-10-CM | POA: Diagnosis not present

## 2020-01-27 DIAGNOSIS — R296 Repeated falls: Secondary | ICD-10-CM | POA: Diagnosis not present

## 2020-01-27 DIAGNOSIS — R262 Difficulty in walking, not elsewhere classified: Secondary | ICD-10-CM | POA: Diagnosis not present

## 2020-01-27 DIAGNOSIS — Z9181 History of falling: Secondary | ICD-10-CM | POA: Diagnosis not present

## 2020-01-27 DIAGNOSIS — R260 Ataxic gait: Secondary | ICD-10-CM | POA: Diagnosis not present

## 2020-02-01 DIAGNOSIS — R262 Difficulty in walking, not elsewhere classified: Secondary | ICD-10-CM | POA: Diagnosis not present

## 2020-02-01 DIAGNOSIS — Z9181 History of falling: Secondary | ICD-10-CM | POA: Diagnosis not present

## 2020-02-01 DIAGNOSIS — R260 Ataxic gait: Secondary | ICD-10-CM | POA: Diagnosis not present

## 2020-02-01 DIAGNOSIS — R296 Repeated falls: Secondary | ICD-10-CM | POA: Diagnosis not present

## 2020-02-03 DIAGNOSIS — Z9181 History of falling: Secondary | ICD-10-CM | POA: Diagnosis not present

## 2020-02-03 DIAGNOSIS — R260 Ataxic gait: Secondary | ICD-10-CM | POA: Diagnosis not present

## 2020-02-03 DIAGNOSIS — R262 Difficulty in walking, not elsewhere classified: Secondary | ICD-10-CM | POA: Diagnosis not present

## 2020-02-03 DIAGNOSIS — R296 Repeated falls: Secondary | ICD-10-CM | POA: Diagnosis not present

## 2020-02-10 ENCOUNTER — Telehealth: Payer: Self-pay | Admitting: Family Medicine

## 2020-02-10 DIAGNOSIS — R262 Difficulty in walking, not elsewhere classified: Secondary | ICD-10-CM | POA: Diagnosis not present

## 2020-02-10 DIAGNOSIS — R260 Ataxic gait: Secondary | ICD-10-CM | POA: Diagnosis not present

## 2020-02-10 DIAGNOSIS — R296 Repeated falls: Secondary | ICD-10-CM | POA: Diagnosis not present

## 2020-02-10 DIAGNOSIS — Z9181 History of falling: Secondary | ICD-10-CM | POA: Diagnosis not present

## 2020-02-10 MED ORDER — SCOPOLAMINE 1 MG/3DAYS TD PT72: 1.0000 | MEDICATED_PATCH | TRANSDERMAL | 0 refills | Status: DC

## 2020-02-10 NOTE — Telephone Encounter (Signed)
I sent in scopolamine patch to her pharmacy.

## 2020-02-10 NOTE — Telephone Encounter (Signed)
Patient called stating she is going on a cruise and want to know if Dr. Caryl Never can call in a prescription for a patch for sea sickness.  [id number] - [place], [place] - [address]

## 2020-02-10 NOTE — Telephone Encounter (Signed)
Please advise 

## 2020-02-11 NOTE — Telephone Encounter (Signed)
Called and  detailed lvm  or patient informing pt that her rx was sent [facility] per pt request.

## 2020-02-12 DIAGNOSIS — R260 Ataxic gait: Secondary | ICD-10-CM | POA: Diagnosis not present

## 2020-02-12 DIAGNOSIS — R262 Difficulty in walking, not elsewhere classified: Secondary | ICD-10-CM | POA: Diagnosis not present

## 2020-02-12 DIAGNOSIS — Z9181 History of falling: Secondary | ICD-10-CM | POA: Diagnosis not present

## 2020-02-12 DIAGNOSIS — R296 Repeated falls: Secondary | ICD-10-CM | POA: Diagnosis not present

## 2020-02-15 DIAGNOSIS — R260 Ataxic gait: Secondary | ICD-10-CM | POA: Diagnosis not present

## 2020-02-15 DIAGNOSIS — R262 Difficulty in walking, not elsewhere classified: Secondary | ICD-10-CM | POA: Diagnosis not present

## 2020-02-15 DIAGNOSIS — R296 Repeated falls: Secondary | ICD-10-CM | POA: Diagnosis not present

## 2020-02-15 DIAGNOSIS — Z9181 History of falling: Secondary | ICD-10-CM | POA: Diagnosis not present

## 2020-02-17 ENCOUNTER — Ambulatory Visit (INDEPENDENT_AMBULATORY_CARE_PROVIDER_SITE_OTHER): Payer: Medicare Other | Admitting: Family Medicine

## 2020-02-17 ENCOUNTER — Other Ambulatory Visit: Payer: Self-pay

## 2020-02-17 ENCOUNTER — Encounter: Payer: Self-pay | Admitting: Family Medicine

## 2020-02-17 VITALS — BP 118/72 | HR 80 | Temp 98.4°F

## 2020-02-17 DIAGNOSIS — L84 Corns and callosities: Secondary | ICD-10-CM

## 2020-02-17 DIAGNOSIS — R260 Ataxic gait: Secondary | ICD-10-CM | POA: Diagnosis not present

## 2020-02-17 DIAGNOSIS — R296 Repeated falls: Secondary | ICD-10-CM | POA: Diagnosis not present

## 2020-02-17 DIAGNOSIS — R262 Difficulty in walking, not elsewhere classified: Secondary | ICD-10-CM | POA: Diagnosis not present

## 2020-02-17 DIAGNOSIS — Z9181 History of falling: Secondary | ICD-10-CM | POA: Diagnosis not present

## 2020-02-17 NOTE — Patient Instructions (Signed)
  Use some topical vaseline to avoid friction in this area.

## 2020-02-17 NOTE — Progress Notes (Signed)
Established Patient Office Visit  Subjective:  Patient ID: Natasha Frederick, female    DOB: [birth year]  Age: 64 y.o. MRN: [id number]  CC:  Chief Complaint  Patient presents with  . Mass    on left side butt     HPI Natasha Frederick presents for recurrent callused area left buttock.  She was seen for this several months ago and this was trimmed down and did improve until recently.  She has a hardened core again.  No relief with topicals.  She has Huntington's disease and has done fairly well with physical therapy.  She is followed by neurologist over at [facility] for that.  She remains on [place]  Past Medical History:  Diagnosis Date  . Acute embolism and thrombosis of deep vein of both distal lower extremities (HCC)   . Arthritis    hip  . C. difficile diarrhea   . Endometriosis   . Huntington disease (HCC)   . Peptic ulcer   . Perforated gastric ulcer (HCC)   . Scoliosis     Past Surgical History:  Procedure Laterality Date  . AMPUTATION Right 08/08/2018   Procedure: AMPUTATION BELOW KNEE RIGHT LOWER EXTREMITY;  Surgeon: Larina Earthly, MD;  Location: [facility] OR;  Service: Vascular;  Laterality: Right;  . ANTERIOR LAT LUMBAR FUSION Left 01/05/2014   Procedure: LUMBAR TWO TO THREE, LUMBAR LUMBAR THREE TO FOUR, ANTERIOR LATERAL LUMBAR FUSION 2 LEVELS;  Surgeon: Reinaldo Meeker, MD;  Location: [facility] NEURO ORS;  Service: Neurosurgery;  Laterality: Left;  . APPENDECTOMY  1963  . BIOPSY  11/25/2018   Procedure: BIOPSY;  Surgeon: Sherrilyn Rist, MD;  Location: [facility];  Service: Gastroenterology;;  . BREAST SURGERY Left 1987   bx  . COLONOSCOPY W/ POLYPECTOMY    . ESOPHAGOGASTRODUODENOSCOPY (EGD) WITH PROPOFOL N/A 11/25/2018   Procedure: ESOPHAGOGASTRODUODENOSCOPY (EGD) WITH PROPOFOL;  Surgeon: Sherrilyn Rist, MD;  Location: [facility];  Service: Gastroenterology;  Laterality: N/A;  . EYE SURGERY Bilateral   . FEMORAL-POPLITEAL BYPASS GRAFT Bilateral 08/06/2018   Procedure:  BILATERAL POPLITEAL AND TIBIAL EMBOLECTOMIES, LEFT LEG FASCIOTOMY;  Surgeon: Larina Earthly, MD;  Location: [facility] OR;  Service: Vascular;  Laterality: Bilateral;  . LAPAROSCOPY N/A 07/30/2018   Procedure: DIAGNOSTIC LAPAROSCOPY, Omentopexy gram patch, Washout of intra-abdominal abcesses x 3;  Surgeon: Karie Soda, MD;  Location: [facility] ORS;  Service: General;  Laterality: N/A;  . OOPHORECTOMY Left 1977  . [place] wart Left 2005  . radial keratoto Bilateral   . TONSILLECTOMY  1958  . TOTAL HIP ARTHROPLASTY Left 07/19/2014   Procedure: TOTAL HIP ARTHROPLASTY;  Surgeon: Gean Birchwood, MD;  Location: [facility] OR;  Service: Orthopedics;  Laterality: Left;  . TUBAL LIGATION  1986    Family History  Problem Relation Age of Onset  . Lung cancer Mother   . Stroke Father   . Huntington's disease Paternal Grandmother   . Huntington's disease Paternal Aunt   . Huntington's disease Paternal Uncle   . Arthritis Neg Hx        family hx  . Cancer Neg Hx        prostate ca -family hx  . Heart disease Neg Hx        family hx  . Colon cancer Neg Hx   . Esophageal cancer Neg Hx   . Pancreatic cancer Neg Hx   . Liver disease Neg Hx     Social History   Socioeconomic History  . Marital status: Married  Spouse name: Not on file  . Number of children: Not on file  . Years of education: Not on file  . Highest education level: Not on file  Occupational History  . Occupation: unemployed    Comment: disabled back pain  Tobacco Use  . Smoking status: Former Smoker    Packs/day: 1.00    Years: 8.00    Pack years: 8.00    Types: Cigarettes    Quit date: 01/17/1994    Years since quitting: 26.1  . Smokeless tobacco: Never Used  Vaping Use  . Vaping Use: Never used  Substance and Sexual Activity  . Alcohol use: Yes    Alcohol/week: 3.0 standard drinks    Types: 3 Cans of beer per week    Comment: per week  . Drug use: No  . Sexual activity: Not on file  Other Topics Concern  . Not on file  Social  History Narrative   Married   Right BKA    Disabled due to back problems       Social Determinants of Health   Financial Resource Strain: Low Risk   . Difficulty of Paying Living Expenses: Not very hard  Food Insecurity: No Food Insecurity  . Worried About Programme researcher, broadcasting/film/video in the Last Year: Never true  . Ran Out of Food in the Last Year: Never true  Transportation Needs: No Transportation Needs  . Lack of Transportation (Medical): No  . Lack of Transportation (Non-Medical): No  Physical Activity: Sufficiently Active  . Days of Exercise per Week: 3 days  . Minutes of Exercise per Session: 60 min  Stress: Stress Concern Present  . Feeling of Stress : To some extent  Social Connections: Unknown  . Frequency of Communication with Friends and Family: More than three times a week  . Frequency of Social Gatherings with Friends and Family: Once a week  . Attends Religious Services: Not on file  . Active Member of Clubs or Organizations: Not on file  . Attends Banker Meetings: Not on file  . Marital Status: Married  Catering manager Violence:   . Fear of Current or Ex-Partner: Not on file  . Emotionally Abused: Not on file  . Physically Abused: Not on file  . Sexually Abused: Not on file    Outpatient Medications Prior to Visit  Medication Sig Dispense Refill  . acetaminophen (TYLENOL) 500 MG tablet Take 1,000 mg by mouth every morning.    . Deutetrabenazine (AUSTEDO) 12 MG TABS Take 24 mg by mouth 2 (two) times daily. 360 tablet 1  . oxyCODONE-acetaminophen (PERCOCET/ROXICET) 5-325 MG tablet Take 1 tablet by mouth every 8 (eight) hours as needed for severe pain. 60 tablet 0  . scopolamine (TRANSDERM-SCOP, 1.5 MG,) 1 MG/3DAYS Place 1 patch (1.5 mg total) onto the skin every 3 (three) days. 4 patch 0  . sennosides-docusate sodium (SENOKOT-S) 8.6-50 MG tablet Take 1 tablet by mouth daily.    . sertraline (ZOLOFT) 100 MG tablet Take 1 tablet (100 mg total) by mouth  daily. 90 tablet 3  . clindamycin (CLEOCIN) 150 MG capsule Dental Appt only (Patient not taking: Reported on 02/17/2020)     No facility-administered medications prior to visit.    Allergies  Allergen Reactions  . Bee Venom Anaphylaxis and Swelling  . Nsaids Other (See Comments)    PERFORATED ULCER = NO MORE NSAIDs  . Adhesive [Tape] Other (See Comments)    redness  . Codeine Nausea And Vomiting  .  Penicillins     Did it involve swelling of the face/tongue/throat, SOB, or low BP? Yes Did it involve sudden or severe rash/hives, skin peeling, or any reaction on the inside of your mouth or nose? Unknown Did you need to seek medical attention at a hospital or doctor's office? Yes When did it last happen?age 55-40 If all above answers are "NO", may proceed with cephalosporin use.   . Sulfa Antibiotics Itching    ROS Review of Systems    Objective:    Physical Exam Vitals reviewed.  Cardiovascular:     Rate and Rhythm: Normal rate and regular rhythm.  Pulmonary:     Effort: Pulmonary effort is normal.     Breath sounds: Normal breath sounds.  Skin:    Comments: Buttock reveals callused area which is about 6 to 7 mm diameter.  This has a hardened core at the center.  No surrounding erythema.  No nodular base.     BP 118/72 (BP Location: Left Arm, Patient Position: Sitting, Cuff Size: Normal)   Pulse 80   Temp 98.4 F (36.9 C) (Oral)   SpO2 93%  Wt Readings from Last 3 Encounters:  08/11/19 135 lb (61.2 kg)  05/27/19 135 lb (61.2 kg)  11/11/18 130 lb (59 kg)     Health Maintenance Due  Topic Date Due  . COVID-19 Vaccine (1) Never done  . MAMMOGRAM  Never done  . COLONOSCOPY  01/07/2013  . DEXA SCAN  Never done  . PNA vac Low Risk Adult (2 of 2 - PPSV23) 08/11/2019  . INFLUENZA VACCINE  11/08/2019    There are no preventive care reminders to display for this patient.  Lab Results  Component Value Date   TSH 2.52 10/09/2017   Lab Results  Component  Value Date   WBC 16.2 (H) 08/13/2018   HGB 8.8 (L) 08/13/2018   HCT 27.4 (L) 08/13/2018   MCV 87.3 08/13/2018   PLT 1,200 (HH) 08/13/2018   Lab Results  Component Value Date   NA 136 08/13/2018   K 3.3 (L) 08/13/2018   CO2 23 08/13/2018   GLUCOSE 93 08/13/2018   BUN 5 (L) 08/13/2018   CREATININE 0.52 08/13/2018   BILITOT 0.8 07/30/2018   ALKPHOS 52 07/30/2018   AST 44 (H) 07/30/2018   ALT 23 07/30/2018   PROT 5.8 (L) 07/30/2018   ALBUMIN 3.2 (L) 07/30/2018   CALCIUM 8.1 (L) 08/13/2018   ANIONGAP 10 08/13/2018   GFR 132.90 07/28/2018   Lab Results  Component Value Date   CHOL 200 10/09/2017   Lab Results  Component Value Date   HDL 68.70 10/09/2017   Lab Results  Component Value Date   LDLCALC 108 (H) 10/09/2017   Lab Results  Component Value Date   TRIG 113.0 10/09/2017   Lab Results  Component Value Date   CHOLHDL 3 10/09/2017   No results found for: HGBA1C    Assessment & Plan:   Callused area left buttock.  We recommend trimming this down with #15 blade and discussed risk including potential for bleeding, low risk of infection, pain.  Patient consented.  Using #15 blade pared down the hardened core and this was removed without difficulty.  No bleeding.  Patient tolerated well  -Recommend she apply some topical Vaseline intermittently to reduce friction and try to change positions frequently -We discussed trying some type of pad in this area for protection but she states she is sensitive to adhesives  No orders of the  defined types were placed in this encounter.   Follow-up: No follow-ups on file.    Carolann Littler, MD

## 2020-02-19 DIAGNOSIS — R296 Repeated falls: Secondary | ICD-10-CM | POA: Diagnosis not present

## 2020-02-19 DIAGNOSIS — R260 Ataxic gait: Secondary | ICD-10-CM | POA: Diagnosis not present

## 2020-02-19 DIAGNOSIS — R262 Difficulty in walking, not elsewhere classified: Secondary | ICD-10-CM | POA: Diagnosis not present

## 2020-02-19 DIAGNOSIS — Z9181 History of falling: Secondary | ICD-10-CM | POA: Diagnosis not present

## 2020-02-22 DIAGNOSIS — Z9181 History of falling: Secondary | ICD-10-CM | POA: Diagnosis not present

## 2020-02-22 DIAGNOSIS — R296 Repeated falls: Secondary | ICD-10-CM | POA: Diagnosis not present

## 2020-02-22 DIAGNOSIS — R260 Ataxic gait: Secondary | ICD-10-CM | POA: Diagnosis not present

## 2020-02-22 DIAGNOSIS — R262 Difficulty in walking, not elsewhere classified: Secondary | ICD-10-CM | POA: Diagnosis not present

## 2020-03-02 ENCOUNTER — Telehealth: Payer: Self-pay | Admitting: Family Medicine

## 2020-03-02 MED ORDER — OXYCODONE-ACETAMINOPHEN 5-325 MG PO TABS: 1.0000 | ORAL_TABLET | Freq: Three times a day (TID) | ORAL | 0 refills | Status: DC | PRN

## 2020-03-02 NOTE — Telephone Encounter (Signed)
Patient needs a refill on Oxycodone.  She needs to get it refilled early because she will be on a cruise and will run out before she gets back.   Pharmacy- [facility] [place]

## 2020-03-23 DIAGNOSIS — Z9181 History of falling: Secondary | ICD-10-CM | POA: Diagnosis not present

## 2020-03-23 DIAGNOSIS — R260 Ataxic gait: Secondary | ICD-10-CM | POA: Diagnosis not present

## 2020-03-23 DIAGNOSIS — R296 Repeated falls: Secondary | ICD-10-CM | POA: Diagnosis not present

## 2020-03-23 DIAGNOSIS — R262 Difficulty in walking, not elsewhere classified: Secondary | ICD-10-CM | POA: Diagnosis not present

## 2020-03-25 DIAGNOSIS — R262 Difficulty in walking, not elsewhere classified: Secondary | ICD-10-CM | POA: Diagnosis not present

## 2020-03-25 DIAGNOSIS — R260 Ataxic gait: Secondary | ICD-10-CM | POA: Diagnosis not present

## 2020-03-25 DIAGNOSIS — Z9181 History of falling: Secondary | ICD-10-CM | POA: Diagnosis not present

## 2020-03-25 DIAGNOSIS — R296 Repeated falls: Secondary | ICD-10-CM | POA: Diagnosis not present

## 2020-03-28 DIAGNOSIS — R296 Repeated falls: Secondary | ICD-10-CM | POA: Diagnosis not present

## 2020-03-28 DIAGNOSIS — R260 Ataxic gait: Secondary | ICD-10-CM | POA: Diagnosis not present

## 2020-03-28 DIAGNOSIS — R262 Difficulty in walking, not elsewhere classified: Secondary | ICD-10-CM | POA: Diagnosis not present

## 2020-03-28 DIAGNOSIS — Z9181 History of falling: Secondary | ICD-10-CM | POA: Diagnosis not present

## 2020-03-30 DIAGNOSIS — Z9181 History of falling: Secondary | ICD-10-CM | POA: Diagnosis not present

## 2020-03-30 DIAGNOSIS — R260 Ataxic gait: Secondary | ICD-10-CM | POA: Diagnosis not present

## 2020-03-30 DIAGNOSIS — R262 Difficulty in walking, not elsewhere classified: Secondary | ICD-10-CM | POA: Diagnosis not present

## 2020-03-30 DIAGNOSIS — R296 Repeated falls: Secondary | ICD-10-CM | POA: Diagnosis not present

## 2020-04-05 DIAGNOSIS — R260 Ataxic gait: Secondary | ICD-10-CM | POA: Diagnosis not present

## 2020-04-05 DIAGNOSIS — R262 Difficulty in walking, not elsewhere classified: Secondary | ICD-10-CM | POA: Diagnosis not present

## 2020-04-05 DIAGNOSIS — Z9181 History of falling: Secondary | ICD-10-CM | POA: Diagnosis not present

## 2020-04-05 DIAGNOSIS — R296 Repeated falls: Secondary | ICD-10-CM | POA: Diagnosis not present

## 2020-04-11 DIAGNOSIS — R260 Ataxic gait: Secondary | ICD-10-CM | POA: Diagnosis not present

## 2020-04-11 DIAGNOSIS — Z9181 History of falling: Secondary | ICD-10-CM | POA: Diagnosis not present

## 2020-04-11 DIAGNOSIS — R296 Repeated falls: Secondary | ICD-10-CM | POA: Diagnosis not present

## 2020-04-11 DIAGNOSIS — R262 Difficulty in walking, not elsewhere classified: Secondary | ICD-10-CM | POA: Diagnosis not present

## 2020-04-12 DIAGNOSIS — Z885 Allergy status to narcotic agent status: Secondary | ICD-10-CM | POA: Diagnosis not present

## 2020-04-12 DIAGNOSIS — G10 Huntington's disease: Secondary | ICD-10-CM | POA: Diagnosis not present

## 2020-04-12 DIAGNOSIS — Z882 Allergy status to sulfonamides status: Secondary | ICD-10-CM | POA: Diagnosis not present

## 2020-04-12 DIAGNOSIS — Z880 Allergy status to penicillin: Secondary | ICD-10-CM | POA: Diagnosis not present

## 2020-04-13 DIAGNOSIS — R262 Difficulty in walking, not elsewhere classified: Secondary | ICD-10-CM | POA: Diagnosis not present

## 2020-04-13 DIAGNOSIS — R296 Repeated falls: Secondary | ICD-10-CM | POA: Diagnosis not present

## 2020-04-13 DIAGNOSIS — Z9181 History of falling: Secondary | ICD-10-CM | POA: Diagnosis not present

## 2020-04-13 DIAGNOSIS — R260 Ataxic gait: Secondary | ICD-10-CM | POA: Diagnosis not present

## 2020-04-16 ENCOUNTER — Other Ambulatory Visit: Payer: Self-pay | Admitting: Family Medicine

## 2020-04-20 ENCOUNTER — Telehealth: Payer: Self-pay | Admitting: Family Medicine

## 2020-04-20 DIAGNOSIS — M21372 Foot drop, left foot: Secondary | ICD-10-CM

## 2020-04-20 NOTE — Telephone Encounter (Signed)
Pts spouse is calling in needing a prescription for a Double upright AFO for L foot sent [facility] in [place]  Attn: Bobby Crutchfield [phone]

## 2020-04-21 NOTE — Telephone Encounter (Signed)
DME order placed, as requested for AFO brace.

## 2020-04-21 NOTE — Telephone Encounter (Signed)
DME order printed and faxed to [facility] at the number below.

## 2020-04-22 ENCOUNTER — Encounter: Payer: Self-pay | Admitting: Family Medicine

## 2020-04-22 ENCOUNTER — Ambulatory Visit (INDEPENDENT_AMBULATORY_CARE_PROVIDER_SITE_OTHER): Payer: Medicare Other | Admitting: Family Medicine

## 2020-04-22 ENCOUNTER — Ambulatory Visit (INDEPENDENT_AMBULATORY_CARE_PROVIDER_SITE_OTHER): Payer: Medicare Other

## 2020-04-22 ENCOUNTER — Other Ambulatory Visit: Payer: Self-pay

## 2020-04-22 VITALS — BP 114/64 | HR 83 | Temp 97.7°F | Resp 18

## 2020-04-22 DIAGNOSIS — R2242 Localized swelling, mass and lump, left lower limb: Secondary | ICD-10-CM

## 2020-04-22 DIAGNOSIS — M85872 Other specified disorders of bone density and structure, left ankle and foot: Secondary | ICD-10-CM | POA: Diagnosis not present

## 2020-04-22 NOTE — Progress Notes (Deleted)
X ray

## 2020-04-22 NOTE — Progress Notes (Signed)
Established Patient Office Visit  Subjective:  Patient ID: Natasha Frederick, female    DOB: [birth year]  Age: 65 y.o. MRN: [id number]  CC:  Chief Complaint  Patient presents with  . Foot Lesion    Left foot lesion, noticed for one week    HPI Natasha Frederick presents for left foot bony prominence which she just noticed about a month ago.  Denies any injury.  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  She does have some pain with ambulation in this region.  She thinks this is slowly growing in size  She has Huntington's disease and is followed by neurology over at [facility].  Her ambulation is becoming more impaired with time.  She is treated with [place]  Past Medical History:  Diagnosis Date  . Acute embolism and thrombosis of deep vein of both distal lower extremities (HCC)   . Arthritis    hip  . C. difficile diarrhea   . Endometriosis   . Huntington disease (HCC)   . Peptic ulcer   . Perforated gastric ulcer (HCC)   . Scoliosis     Past Surgical History:  Procedure Laterality Date  . AMPUTATION Right 08/08/2018   Procedure: AMPUTATION BELOW KNEE RIGHT LOWER EXTREMITY;  Surgeon: Larina Earthly, MD;  [facility]: [facility];  Service: Vascular;  Laterality: Right;  . ANTERIOR LAT LUMBAR FUSION Left 01/05/2014   Procedure: LUMBAR TWO TO THREE, LUMBAR LUMBAR THREE TO FOUR, ANTERIOR LATERAL LUMBAR FUSION 2 LEVELS;  Surgeon: Reinaldo Meeker, MD;  [facility]: [facility];  Service: Neurosurgery;  Laterality: Left;  . APPENDECTOMY  1963  . BIOPSY  11/25/2018   Procedure: BIOPSY;  Surgeon: Sherrilyn Rist, MD;  Location: [facility];  Service: Gastroenterology;;  . BREAST SURGERY Left 1987   bx  . COLONOSCOPY W/ POLYPECTOMY    . ESOPHAGOGASTRODUODENOSCOPY (EGD) WITH PROPOFOL N/A 11/25/2018   Procedure: ESOPHAGOGASTRODUODENOSCOPY (EGD) WITH PROPOFOL;  Surgeon: Sherrilyn Rist, MD;   Location: [facility];  Service: Gastroenterology;  Laterality: N/A;  . EYE SURGERY Bilateral   . FEMORAL-POPLITEAL BYPASS GRAFT Bilateral 08/06/2018   Procedure: BILATERAL POPLITEAL AND TIBIAL EMBOLECTOMIES, LEFT LEG FASCIOTOMY;  Surgeon: Larina Earthly, MD;  [facility]: [facility];  Service: Vascular;  Laterality: Bilateral;  . LAPAROSCOPY N/A 07/30/2018   Procedure: DIAGNOSTIC LAPAROSCOPY, Omentopexy gram patch, Washout of intra-abdominal abcesses x 3;  Surgeon: Karie Soda, MD;  Location: [facility];  Service: General;  Laterality: N/A;  . OOPHORECTOMY Left 1977  . Planters wart Left 2005  . radial keratoto Bilateral   . TONSILLECTOMY  1958  . TOTAL HIP ARTHROPLASTY Left 07/19/2014   Procedure: TOTAL HIP ARTHROPLASTY;  Surgeon: Gean Birchwood, MD;  Location: [facility];  Service: Orthopedics;  Laterality: Left;  . TUBAL LIGATION  1986    Family History  Problem Relation Age of Onset  . Lung cancer Mother   . Stroke Father   . Huntington's disease Paternal Grandmother   . Huntington's disease Paternal Aunt   . Huntington's disease Paternal Uncle   . Arthritis Neg Hx        family hx  . Cancer Neg Hx        prostate ca -family hx  . Heart disease Neg Hx        family hx  . Colon cancer Neg Hx   . Esophageal cancer Neg Hx   . Pancreatic cancer  Neg Hx   . Liver disease Neg Hx     Social History   Socioeconomic History  . Marital status: Married    Spouse name: Not on file  . Number of children: Not on file  . Years of education: Not on file  . Highest education level: Not on file  Occupational History  . Occupation: unemployed    Comment: disabled back pain  Tobacco Use  . Smoking status: Former Smoker    Packs/day: 1.00    Years: 8.00    Pack years: 8.00    Types: Cigarettes    Quit date: 01/17/1994    Years since quitting: 26.2  . Smokeless tobacco: Never Used  Vaping Use  . Vaping Use: Never used  Substance and Sexual Activity  . Alcohol use: Yes    Alcohol/week: 3.0 standard  drinks    Types: 3 Cans of beer per week    Comment: per week  . Drug use: No  . Sexual activity: Not on file  Other Topics Concern  . Not on file  Social History Narrative   Married   Right BKA    Disabled due to back problems       Social Determinants of Health   Financial Resource Strain: Low Risk   . Difficulty of Paying Living Expenses: Not very hard  Food Insecurity: No Food Insecurity  . Worried About Programme researcher, broadcasting/film/video in the Last Year: Never true  . Ran Out of Food in the Last Year: Never true  Transportation Needs: No Transportation Needs  . Lack of Transportation (Medical): No  . Lack of Transportation (Non-Medical): No  Physical Activity: Sufficiently Active  . Days of Exercise per Week: 3 days  . Minutes of Exercise per Session: 60 min  Stress: Stress Concern Present  . Feeling of Stress : To some extent  Social Connections: Unknown  . Frequency of Communication with Friends and Family: More than three times a week  . Frequency of Social Gatherings with Friends and Family: Once a week  . Attends Religious Services: Not on file  . Active Member of Clubs or Organizations: Not on file  . Attends Banker Meetings: Not on file  . Marital Status: Married  Catering manager Violence: Not on file    Outpatient Medications Prior to Visit  Medication Sig Dispense Refill  . acetaminophen (TYLENOL) 500 MG tablet Take 1,000 mg by mouth every morning.    . Deutetrabenazine (AUSTEDO) 12 MG TABS Take 24 mg by mouth 2 (two) times daily. 360 tablet 1  . oxyCODONE-acetaminophen (PERCOCET/ROXICET) 5-325 MG tablet Take 1 tablet by mouth every 8 (eight) hours as needed for severe pain. 60 tablet 0  . sennosides-docusate sodium (SENOKOT-S) 8.6-50 MG tablet Take 1 tablet by mouth daily.    . sertraline (ZOLOFT) 100 MG tablet TAKE 1 TABLET(100 MG) BY MOUTH DAILY 90 tablet 0  . scopolamine (TRANSDERM-SCOP, 1.5 MG,) 1 MG/3DAYS Place 1 patch (1.5 mg total) onto the skin  every 3 (three) days. 4 patch 0  . clindamycin (CLEOCIN) 150 MG capsule Dental Appt only (Patient not taking: No sig reported)     No facility-administered medications prior to visit.    Allergies  Allergen Reactions  . Bee Venom Anaphylaxis and Swelling  . Nsaids Other (See Comments)    PERFORATED ULCER = NO MORE NSAIDs  . Adhesive [Tape] Other (See Comments)    redness  . Codeine Nausea And Vomiting  . Penicillins  Did it involve swelling of the face/tongue/throat, SOB, or low BP? Yes Did it involve sudden or severe rash/hives, skin peeling, or any reaction on the inside of your mouth or nose? Unknown Did you need to seek medical attention at a hospital or doctor's office? Yes When did it last happen?age 24-40 If all above answers are "NO", may proceed with cephalosporin use.   . Sulfa Antibiotics Itching    ROS Review of Systems  Constitutional: Negative for appetite change and unexpected weight change.  Hematological: Negative for adenopathy.      Objective:    Physical Exam Constitutional:      Appearance: Normal appearance.  Cardiovascular:     Rate and Rhythm: Normal rate and regular rhythm.  Pulmonary:     Effort: Pulmonary effort is normal.     Breath sounds: Normal breath sounds.  Musculoskeletal:     Comments: She has firm bony prominence dorsum left foot over the proximal forefoot.  This is nontender.  No overlying erythema.  No fluctuance.  Size is approximately 2 cm  Neurological:     Mental Status: She is alert.     BP 114/64 (BP Location: Left Arm, Patient Position: Sitting, Cuff Size: Normal)   Pulse 83   Temp 97.7 F (36.5 C) (Oral)   Resp 18   SpO2 98%  Wt Readings from Last 3 Encounters:  08/11/19 135 lb (61.2 kg)  05/27/19 135 lb (61.2 kg)  11/11/18 130 lb (59 kg)     Health Maintenance Due  Topic Date Due  . COVID-19 Vaccine (1) Never done  . MAMMOGRAM  Never done  . COLONOSCOPY (Pts 45-79yrs Insurance coverage will need  to be confirmed)  01/07/2013  . DEXA SCAN  Never done  . PNA vac Low Risk Adult (2 of 2 - PPSV23) 08/11/2019  . INFLUENZA VACCINE  11/08/2019    There are no preventive care reminders to display for this patient.  Lab Results  Component Value Date   TSH 2.52 10/09/2017   Lab Results  Component Value Date   WBC 16.2 (H) 08/13/2018   HGB 8.8 (L) 08/13/2018   HCT 27.4 (L) 08/13/2018   MCV 87.3 08/13/2018   PLT 1,200 (HH) 08/13/2018   Lab Results  Component Value Date   NA 136 08/13/2018   K 3.3 (L) 08/13/2018   CO2 23 08/13/2018   GLUCOSE 93 08/13/2018   BUN 5 (L) 08/13/2018   CREATININE 0.52 08/13/2018   BILITOT 0.8 07/30/2018   ALKPHOS 52 07/30/2018   AST 44 (H) 07/30/2018   ALT 23 07/30/2018   PROT 5.8 (L) 07/30/2018   ALBUMIN 3.2 (L) 07/30/2018   CALCIUM 8.1 (L) 08/13/2018   ANIONGAP 10 08/13/2018   GFR 132.90 07/28/2018   Lab Results  Component Value Date   CHOL 200 10/09/2017   Lab Results  Component Value Date   HDL 68.70 10/09/2017   Lab Results  Component Value Date   LDLCALC 108 (H) 10/09/2017   Lab Results  Component Value Date   TRIG 113.0 10/09/2017   Lab Results  Component Value Date   CHOLHDL 3 10/09/2017   No results found for: HGBA1C    Assessment & Plan:   Problem List Items Addressed This Visit   None   Visit Diagnoses    Foot mass, left    -  Primary   Relevant Orders   DG Foot Complete Left    -Obtain x-rays left foot to further assess  No orders  of the defined types were placed in this encounter.   Follow-up: No follow-ups on file.    Carolann Littler, MD

## 2020-04-27 ENCOUNTER — Telehealth: Payer: Self-pay

## 2020-04-27 NOTE — Telephone Encounter (Signed)
-----   Message from Kristian Covey, MD sent at 04/24/2020 12:29 PM EST ----- No acute or worrisome bone lesions noted.  No further evaluation recommended at this time, unless she has increased pain or increasing growth.

## 2020-04-27 NOTE — Telephone Encounter (Signed)
Left message for patient to call office regarding results

## 2020-04-29 ENCOUNTER — Telehealth: Payer: Self-pay

## 2020-04-29 NOTE — Telephone Encounter (Signed)
Left detailed message for patient with results and recommendations.

## 2020-04-29 NOTE — Telephone Encounter (Signed)
-----   Message from Bruce W Burchette, MD sent at 04/24/2020 12:29 PM EST ----- No acute or worrisome bone lesions noted.  No further evaluation recommended at this time, unless she has increased pain or increasing growth.  

## 2020-05-02 NOTE — Telephone Encounter (Signed)
Informed patient of results.  She is still having pain.  Patient will follow up with ortho if needed.

## 2020-05-03 ENCOUNTER — Other Ambulatory Visit: Payer: Self-pay

## 2020-05-03 DIAGNOSIS — R296 Repeated falls: Secondary | ICD-10-CM | POA: Diagnosis not present

## 2020-05-03 DIAGNOSIS — R262 Difficulty in walking, not elsewhere classified: Secondary | ICD-10-CM | POA: Diagnosis not present

## 2020-05-03 DIAGNOSIS — Z9181 History of falling: Secondary | ICD-10-CM | POA: Diagnosis not present

## 2020-05-03 DIAGNOSIS — R260 Ataxic gait: Secondary | ICD-10-CM | POA: Diagnosis not present

## 2020-05-04 ENCOUNTER — Encounter: Payer: Self-pay | Admitting: Family Medicine

## 2020-05-04 ENCOUNTER — Ambulatory Visit (INDEPENDENT_AMBULATORY_CARE_PROVIDER_SITE_OTHER): Payer: Medicare Other | Admitting: Family Medicine

## 2020-05-04 VITALS — BP 132/68 | HR 77 | Ht 64.0 in | Wt 134.0 lb

## 2020-05-04 DIAGNOSIS — Z993 Dependence on wheelchair: Secondary | ICD-10-CM

## 2020-05-04 DIAGNOSIS — L84 Corns and callosities: Secondary | ICD-10-CM

## 2020-05-04 NOTE — Progress Notes (Signed)
Established Patient Office Visit  Subjective:  Patient ID: Natasha Frederick, female    DOB: [birth year]  Age: 65 y.o. MRN: [id number]  CC:  Chief Complaint  Patient presents with  . Skin Problem    HPI Natasha Frederick presents for recurrent callused area left buttock near the junction of her buttock and upper thigh region.  She has Huntington's disease and also has prior history of below-knee amputation.  She spends most of her time in a wheelchair.  This causes constant frictional area.  We have previously trimmed this down with #15 blade which is helped immensely.  She tends to get recurrent callus over several months.  She is requesting trimming this again today.  Past Medical History:  Diagnosis Date  . Acute embolism and thrombosis of deep vein of both distal lower extremities (HCC)   . Arthritis    hip  . C. difficile diarrhea   . Endometriosis   . Huntington disease (HCC)   . Peptic ulcer   . Perforated gastric ulcer (HCC)   . Scoliosis     Past Surgical History:  Procedure Laterality Date  . AMPUTATION Right 08/08/2018   Procedure: AMPUTATION BELOW KNEE RIGHT LOWER EXTREMITY;  Surgeon: Larina Earthly, MD;  [facility]: [facility];  Service: Vascular;  Laterality: Right;  . ANTERIOR LAT LUMBAR FUSION Left 01/05/2014   Procedure: LUMBAR TWO TO THREE, LUMBAR LUMBAR THREE TO FOUR, ANTERIOR LATERAL LUMBAR FUSION 2 LEVELS;  Surgeon: Reinaldo Meeker, MD;  [facility]: [facility];  Service: Neurosurgery;  Laterality: Left;  . APPENDECTOMY  1963  . BIOPSY  11/25/2018   Procedure: BIOPSY;  Surgeon: Sherrilyn Rist, MD;  [facility]: [facility];  Service: Gastroenterology;;  . BREAST SURGERY Left 1987   bx  . COLONOSCOPY W/ POLYPECTOMY    . ESOPHAGOGASTRODUODENOSCOPY (EGD) WITH PROPOFOL N/A 11/25/2018   Procedure: ESOPHAGOGASTRODUODENOSCOPY (EGD) WITH PROPOFOL;  Surgeon: Sherrilyn Rist, MD;  [facility];  Service: Gastroenterology;  Laterality: N/A;  . EYE SURGERY Bilateral   .  FEMORAL-POPLITEAL BYPASS GRAFT Bilateral 08/06/2018   Procedure: BILATERAL POPLITEAL AND TIBIAL EMBOLECTOMIES, LEFT LEG FASCIOTOMY;  Surgeon: Larina Earthly, MD;  [facility]: [facility];  Service: Vascular;  Laterality: Bilateral;  . LAPAROSCOPY N/A 07/30/2018   Procedure: DIAGNOSTIC LAPAROSCOPY, Omentopexy gram patch, Washout of intra-abdominal abcesses x 3;  Surgeon: Karie Soda, MD;  Location: [facility];  Service: General;  Laterality: N/A;  . OOPHORECTOMY Left 1977  . Planters wart Left 2005  . radial keratoto Bilateral   . TONSILLECTOMY  1958  . TOTAL HIP ARTHROPLASTY Left 07/19/2014   Procedure: TOTAL HIP ARTHROPLASTY;  Surgeon: Gean Birchwood, MD;  Location: [facility];  Service: Orthopedics;  Laterality: Left;  . TUBAL LIGATION  1986    Family History  Problem Relation Age of Onset  . Lung cancer Mother   . Stroke Father   . Huntington's disease Paternal Grandmother   . Huntington's disease Paternal Aunt   . Huntington's disease Paternal Uncle   . Arthritis Neg Hx        family hx  . Cancer Neg Hx        prostate ca -family hx  . Heart disease Neg Hx        family hx  . Colon cancer Neg Hx   . Esophageal cancer Neg Hx   . Pancreatic cancer Neg Hx   . Liver disease Neg Hx     Social History   Socioeconomic History  .  Marital status: Married    Spouse name: Not on file  . Number of children: Not on file  . Years of education: Not on file  . Highest education level: Not on file  Occupational History  . Occupation: unemployed    Comment: disabled back pain  Tobacco Use  . Smoking status: Former Smoker    Packs/day: 1.00    Years: 8.00    Pack years: 8.00    Types: Cigarettes    Quit date: 01/17/1994    Years since quitting: 26.3  . Smokeless tobacco: Never Used  Vaping Use  . Vaping Use: Never used  Substance and Sexual Activity  . Alcohol use: Yes    Alcohol/week: 3.0 standard drinks    Types: 3 Cans of beer per week    Comment: per week  . Drug use: No  . Sexual  activity: Not on file  Other Topics Concern  . Not on file  Social History Narrative   Married   Right BKA    Disabled due to back problems       Social Determinants of Health   Financial Resource Strain: Low Risk   . Difficulty of Paying Living Expenses: Not very hard  Food Insecurity: No Food Insecurity  . Worried About Programme researcher, broadcasting/film/video in the Last Year: Never true  . Ran Out of Food in the Last Year: Never true  Transportation Needs: No Transportation Needs  . Lack of Transportation (Medical): No  . Lack of Transportation (Non-Medical): No  Physical Activity: Sufficiently Active  . Days of Exercise per Week: 3 days  . Minutes of Exercise per Session: 60 min  Stress: Stress Concern Present  . Feeling of Stress : To some extent  Social Connections: Unknown  . Frequency of Communication with Friends and Family: More than three times a week  . Frequency of Social Gatherings with Friends and Family: Once a week  . Attends Religious Services: Not on file  . Active Member of Clubs or Organizations: Not on file  . Attends Banker Meetings: Not on file  . Marital Status: Married  Catering manager Violence: Not on file    Outpatient Medications Prior to Visit  Medication Sig Dispense Refill  . acetaminophen (TYLENOL) 500 MG tablet Take 1,000 mg by mouth every morning.    . Deutetrabenazine (AUSTEDO) 12 MG TABS Take 24 mg by mouth 2 (two) times daily. 360 tablet 1  . oxyCODONE-acetaminophen (PERCOCET/ROXICET) 5-325 MG tablet Take 1 tablet by mouth every 8 (eight) hours as needed for severe pain. 60 tablet 0  . sennosides-docusate sodium (SENOKOT-S) 8.6-50 MG tablet Take 1 tablet by mouth daily.    . sertraline (ZOLOFT) 100 MG tablet TAKE 1 TABLET(100 MG) BY MOUTH DAILY 90 tablet 0   No facility-administered medications prior to visit.    Allergies  Allergen Reactions  . Bee Venom Anaphylaxis and Swelling  . Nsaids Other (See Comments)    PERFORATED ULCER = NO  MORE NSAIDs  . Adhesive [Tape] Other (See Comments)    redness  . Codeine Nausea And Vomiting  . Penicillins     Did it involve swelling of the face/tongue/throat, SOB, or low BP? Yes Did it involve sudden or severe rash/hives, skin peeling, or any reaction on the inside of your mouth or nose? Unknown Did you need to seek medical attention at a hospital or doctor's office? Yes When did it last happen?age 75-40 If all above answers are "NO", may proceed with  cephalosporin use.   . Sulfa Antibiotics Itching    ROS Review of Systems  Constitutional: Negative for chills and fever.      Objective:    Physical Exam Vitals reviewed.  Constitutional:      Appearance: Normal appearance.  Cardiovascular:     Rate and Rhythm: Normal rate and regular rhythm.  Skin:    Comments: Small approximately 5 mm callused area left lower buttock.  Slightly tender to palpation.  No surrounding erythema.  No drainage.  Neurological:     Mental Status: She is alert.     BP 132/68   Pulse 77   Ht 5\' 4"  (1.626 m)   Wt 134 lb (60.8 kg)   SpO2 95%   BMI 23.00 kg/m  Wt Readings from Last 3 Encounters:  05/04/20 134 lb (60.8 kg)  08/11/19 135 lb (61.2 kg)  05/27/19 135 lb (61.2 kg)     Health Maintenance Due  Topic Date Due  . COVID-19 Vaccine (1) Never done  . MAMMOGRAM  Never done  . COLONOSCOPY (Pts 45-35yrs Insurance coverage will need to be confirmed)  01/07/2013  . DEXA SCAN  Never done  . PNA vac Low Risk Adult (2 of 2 - PPSV23) 08/11/2019  . INFLUENZA VACCINE  11/08/2019    There are no preventive care reminders to display for this patient.  Lab Results  Component Value Date   TSH 2.52 10/09/2017   Lab Results  Component Value Date   WBC 16.2 (H) 08/13/2018   HGB 8.8 (L) 08/13/2018   HCT 27.4 (L) 08/13/2018   MCV 87.3 08/13/2018   PLT 1,200 (HH) 08/13/2018   Lab Results  Component Value Date   NA 136 08/13/2018   K 3.3 (L) 08/13/2018   CO2 23 08/13/2018    GLUCOSE 93 08/13/2018   BUN 5 (L) 08/13/2018   CREATININE 0.52 08/13/2018   BILITOT 0.8 07/30/2018   ALKPHOS 52 07/30/2018   AST 44 (H) 07/30/2018   ALT 23 07/30/2018   PROT 5.8 (L) 07/30/2018   ALBUMIN 3.2 (L) 07/30/2018   CALCIUM 8.1 (L) 08/13/2018   ANIONGAP 10 08/13/2018   GFR 132.90 07/28/2018   Lab Results  Component Value Date   CHOL 200 10/09/2017   Lab Results  Component Value Date   HDL 68.70 10/09/2017   Lab Results  Component Value Date   LDLCALC 108 (H) 10/09/2017   Lab Results  Component Value Date   TRIG 113.0 10/09/2017   Lab Results  Component Value Date   CHOLHDL 3 10/09/2017   No results found for: HGBA1C    Assessment & Plan:   Problem List Items Addressed This Visit   None   Visit Diagnoses    Skin callus    -  Primary    We discussed risk and benefits of trimming this with #15 blade including risk of bleeding and low risk of infection.  Patient consented.  Using #15 blade we pared down the hard callused center.  Patient tolerated well and felt better afterwards.  No bleeding.  She is aware this will recur with time  No orders of the defined types were placed in this encounter.   Follow-up: No follow-ups on file.    12/10/2017, MD

## 2020-05-05 DIAGNOSIS — R296 Repeated falls: Secondary | ICD-10-CM | POA: Diagnosis not present

## 2020-05-05 DIAGNOSIS — R262 Difficulty in walking, not elsewhere classified: Secondary | ICD-10-CM | POA: Diagnosis not present

## 2020-05-05 DIAGNOSIS — Z9181 History of falling: Secondary | ICD-10-CM | POA: Diagnosis not present

## 2020-05-05 DIAGNOSIS — R260 Ataxic gait: Secondary | ICD-10-CM | POA: Diagnosis not present

## 2020-05-09 ENCOUNTER — Telehealth: Payer: Self-pay | Admitting: Family Medicine

## 2020-05-09 DIAGNOSIS — R296 Repeated falls: Secondary | ICD-10-CM | POA: Diagnosis not present

## 2020-05-09 DIAGNOSIS — Z9181 History of falling: Secondary | ICD-10-CM | POA: Diagnosis not present

## 2020-05-09 DIAGNOSIS — R262 Difficulty in walking, not elsewhere classified: Secondary | ICD-10-CM | POA: Diagnosis not present

## 2020-05-09 DIAGNOSIS — R260 Ataxic gait: Secondary | ICD-10-CM | POA: Diagnosis not present

## 2020-05-09 MED ORDER — OXYCODONE-ACETAMINOPHEN 5-325 MG PO TABS: 1.0000 | ORAL_TABLET | Freq: Three times a day (TID) | ORAL | 0 refills | Status: DC | PRN

## 2020-05-09 NOTE — Telephone Encounter (Signed)
oxyCODONE-acetaminophen (PERCOCET/ROXICET) 5-325 MG tabl   [id number] - [place], [place] - [address] Phone:  [phone]  Fax:  [phone]

## 2020-05-11 DIAGNOSIS — R296 Repeated falls: Secondary | ICD-10-CM | POA: Diagnosis not present

## 2020-05-11 DIAGNOSIS — R262 Difficulty in walking, not elsewhere classified: Secondary | ICD-10-CM | POA: Diagnosis not present

## 2020-05-11 DIAGNOSIS — R260 Ataxic gait: Secondary | ICD-10-CM | POA: Diagnosis not present

## 2020-05-11 DIAGNOSIS — Z9181 History of falling: Secondary | ICD-10-CM | POA: Diagnosis not present

## 2020-05-16 DIAGNOSIS — R296 Repeated falls: Secondary | ICD-10-CM | POA: Diagnosis not present

## 2020-05-16 DIAGNOSIS — R262 Difficulty in walking, not elsewhere classified: Secondary | ICD-10-CM | POA: Diagnosis not present

## 2020-05-16 DIAGNOSIS — R260 Ataxic gait: Secondary | ICD-10-CM | POA: Diagnosis not present

## 2020-05-16 DIAGNOSIS — Z9181 History of falling: Secondary | ICD-10-CM | POA: Diagnosis not present

## 2020-05-18 DIAGNOSIS — R296 Repeated falls: Secondary | ICD-10-CM | POA: Diagnosis not present

## 2020-05-18 DIAGNOSIS — R262 Difficulty in walking, not elsewhere classified: Secondary | ICD-10-CM | POA: Diagnosis not present

## 2020-05-18 DIAGNOSIS — R260 Ataxic gait: Secondary | ICD-10-CM | POA: Diagnosis not present

## 2020-05-18 DIAGNOSIS — Z9181 History of falling: Secondary | ICD-10-CM | POA: Diagnosis not present

## 2020-05-23 ENCOUNTER — Telehealth: Payer: Self-pay | Admitting: Family Medicine

## 2020-05-23 DIAGNOSIS — Z9181 History of falling: Secondary | ICD-10-CM | POA: Diagnosis not present

## 2020-05-23 DIAGNOSIS — R260 Ataxic gait: Secondary | ICD-10-CM | POA: Diagnosis not present

## 2020-05-23 DIAGNOSIS — R262 Difficulty in walking, not elsewhere classified: Secondary | ICD-10-CM | POA: Diagnosis not present

## 2020-05-23 DIAGNOSIS — R296 Repeated falls: Secondary | ICD-10-CM | POA: Diagnosis not present

## 2020-05-23 NOTE — Telephone Encounter (Signed)
Left message for patient to call back and schedule Medicare Annual Wellness Visit (AWV) either virtually or in office. No detailed message left  Last AWVI 05/27/19  please schedule at anytime with [facility] Nurse Health Advisor 1 or 2   This should be a 45 minute visit.

## 2020-05-25 DIAGNOSIS — Z9181 History of falling: Secondary | ICD-10-CM | POA: Diagnosis not present

## 2020-05-25 DIAGNOSIS — R262 Difficulty in walking, not elsewhere classified: Secondary | ICD-10-CM | POA: Diagnosis not present

## 2020-05-25 DIAGNOSIS — R260 Ataxic gait: Secondary | ICD-10-CM | POA: Diagnosis not present

## 2020-05-25 DIAGNOSIS — R296 Repeated falls: Secondary | ICD-10-CM | POA: Diagnosis not present

## 2020-05-27 DIAGNOSIS — Z79899 Other long term (current) drug therapy: Secondary | ICD-10-CM | POA: Diagnosis not present

## 2020-05-27 DIAGNOSIS — G10 Huntington's disease: Secondary | ICD-10-CM | POA: Diagnosis not present

## 2020-06-07 DIAGNOSIS — R262 Difficulty in walking, not elsewhere classified: Secondary | ICD-10-CM | POA: Diagnosis not present

## 2020-06-07 DIAGNOSIS — R260 Ataxic gait: Secondary | ICD-10-CM | POA: Diagnosis not present

## 2020-06-07 DIAGNOSIS — Z9181 History of falling: Secondary | ICD-10-CM | POA: Diagnosis not present

## 2020-06-07 DIAGNOSIS — R296 Repeated falls: Secondary | ICD-10-CM | POA: Diagnosis not present

## 2020-06-09 DIAGNOSIS — R262 Difficulty in walking, not elsewhere classified: Secondary | ICD-10-CM | POA: Diagnosis not present

## 2020-06-09 DIAGNOSIS — R296 Repeated falls: Secondary | ICD-10-CM | POA: Diagnosis not present

## 2020-06-09 DIAGNOSIS — Z9181 History of falling: Secondary | ICD-10-CM | POA: Diagnosis not present

## 2020-06-09 DIAGNOSIS — R260 Ataxic gait: Secondary | ICD-10-CM | POA: Diagnosis not present

## 2020-06-13 DIAGNOSIS — R296 Repeated falls: Secondary | ICD-10-CM | POA: Diagnosis not present

## 2020-06-13 DIAGNOSIS — Z9181 History of falling: Secondary | ICD-10-CM | POA: Diagnosis not present

## 2020-06-13 DIAGNOSIS — R262 Difficulty in walking, not elsewhere classified: Secondary | ICD-10-CM | POA: Diagnosis not present

## 2020-06-13 DIAGNOSIS — R260 Ataxic gait: Secondary | ICD-10-CM | POA: Diagnosis not present

## 2020-06-15 DIAGNOSIS — R260 Ataxic gait: Secondary | ICD-10-CM | POA: Diagnosis not present

## 2020-06-15 DIAGNOSIS — Z9181 History of falling: Secondary | ICD-10-CM | POA: Diagnosis not present

## 2020-06-15 DIAGNOSIS — R296 Repeated falls: Secondary | ICD-10-CM | POA: Diagnosis not present

## 2020-06-15 DIAGNOSIS — R262 Difficulty in walking, not elsewhere classified: Secondary | ICD-10-CM | POA: Diagnosis not present

## 2020-06-21 DIAGNOSIS — R262 Difficulty in walking, not elsewhere classified: Secondary | ICD-10-CM | POA: Diagnosis not present

## 2020-06-21 DIAGNOSIS — R296 Repeated falls: Secondary | ICD-10-CM | POA: Diagnosis not present

## 2020-06-21 DIAGNOSIS — Z9181 History of falling: Secondary | ICD-10-CM | POA: Diagnosis not present

## 2020-06-21 DIAGNOSIS — R260 Ataxic gait: Secondary | ICD-10-CM | POA: Diagnosis not present

## 2020-06-23 DIAGNOSIS — R296 Repeated falls: Secondary | ICD-10-CM | POA: Diagnosis not present

## 2020-06-23 DIAGNOSIS — R262 Difficulty in walking, not elsewhere classified: Secondary | ICD-10-CM | POA: Diagnosis not present

## 2020-06-23 DIAGNOSIS — R260 Ataxic gait: Secondary | ICD-10-CM | POA: Diagnosis not present

## 2020-06-23 DIAGNOSIS — Z9181 History of falling: Secondary | ICD-10-CM | POA: Diagnosis not present

## 2020-06-29 DIAGNOSIS — R260 Ataxic gait: Secondary | ICD-10-CM | POA: Diagnosis not present

## 2020-06-29 DIAGNOSIS — Z9181 History of falling: Secondary | ICD-10-CM | POA: Diagnosis not present

## 2020-06-29 DIAGNOSIS — R262 Difficulty in walking, not elsewhere classified: Secondary | ICD-10-CM | POA: Diagnosis not present

## 2020-06-29 DIAGNOSIS — R296 Repeated falls: Secondary | ICD-10-CM | POA: Diagnosis not present

## 2020-07-01 DIAGNOSIS — R260 Ataxic gait: Secondary | ICD-10-CM | POA: Diagnosis not present

## 2020-07-01 DIAGNOSIS — R296 Repeated falls: Secondary | ICD-10-CM | POA: Diagnosis not present

## 2020-07-01 DIAGNOSIS — Z9181 History of falling: Secondary | ICD-10-CM | POA: Diagnosis not present

## 2020-07-01 DIAGNOSIS — R262 Difficulty in walking, not elsewhere classified: Secondary | ICD-10-CM | POA: Diagnosis not present

## 2020-07-06 ENCOUNTER — Ambulatory Visit: Payer: Medicare Other | Admitting: Family Medicine

## 2020-07-06 DIAGNOSIS — Z9181 History of falling: Secondary | ICD-10-CM | POA: Diagnosis not present

## 2020-07-06 DIAGNOSIS — R296 Repeated falls: Secondary | ICD-10-CM | POA: Diagnosis not present

## 2020-07-06 DIAGNOSIS — R260 Ataxic gait: Secondary | ICD-10-CM | POA: Diagnosis not present

## 2020-07-06 DIAGNOSIS — R262 Difficulty in walking, not elsewhere classified: Secondary | ICD-10-CM | POA: Diagnosis not present

## 2020-07-12 DIAGNOSIS — Z9181 History of falling: Secondary | ICD-10-CM | POA: Diagnosis not present

## 2020-07-12 DIAGNOSIS — R260 Ataxic gait: Secondary | ICD-10-CM | POA: Diagnosis not present

## 2020-07-12 DIAGNOSIS — R296 Repeated falls: Secondary | ICD-10-CM | POA: Diagnosis not present

## 2020-07-12 DIAGNOSIS — R262 Difficulty in walking, not elsewhere classified: Secondary | ICD-10-CM | POA: Diagnosis not present

## 2020-07-14 ENCOUNTER — Telehealth: Payer: Self-pay | Admitting: Family Medicine

## 2020-07-14 DIAGNOSIS — R296 Repeated falls: Secondary | ICD-10-CM | POA: Diagnosis not present

## 2020-07-14 DIAGNOSIS — Z9181 History of falling: Secondary | ICD-10-CM | POA: Diagnosis not present

## 2020-07-14 DIAGNOSIS — R260 Ataxic gait: Secondary | ICD-10-CM | POA: Diagnosis not present

## 2020-07-14 DIAGNOSIS — R262 Difficulty in walking, not elsewhere classified: Secondary | ICD-10-CM | POA: Diagnosis not present

## 2020-07-14 MED ORDER — SERTRALINE HCL 100 MG PO TABS: ORAL_TABLET | ORAL | 0 refills | Status: DC

## 2020-07-14 NOTE — Telephone Encounter (Signed)
Patient is requesting a refill on sertraline.  Pharmacy: [facility]   Please advise.

## 2020-07-14 NOTE — Telephone Encounter (Signed)
Rx sent in.  Spoke with the patient she is aware. Nothing further needed.

## 2020-07-15 ENCOUNTER — Other Ambulatory Visit: Payer: Self-pay | Admitting: Family Medicine

## 2020-07-15 MED ORDER — OXYCODONE-ACETAMINOPHEN 5-325 MG PO TABS: 1.0000 | ORAL_TABLET | Freq: Three times a day (TID) | ORAL | 0 refills | Status: DC | PRN

## 2020-07-16 ENCOUNTER — Other Ambulatory Visit: Payer: Self-pay | Admitting: Family Medicine

## 2020-07-19 DIAGNOSIS — R260 Ataxic gait: Secondary | ICD-10-CM | POA: Diagnosis not present

## 2020-07-19 DIAGNOSIS — Z79899 Other long term (current) drug therapy: Secondary | ICD-10-CM | POA: Diagnosis not present

## 2020-07-19 DIAGNOSIS — R262 Difficulty in walking, not elsewhere classified: Secondary | ICD-10-CM | POA: Diagnosis not present

## 2020-07-19 DIAGNOSIS — Z5181 Encounter for therapeutic drug level monitoring: Secondary | ICD-10-CM | POA: Diagnosis not present

## 2020-07-19 DIAGNOSIS — G10 Huntington's disease: Secondary | ICD-10-CM | POA: Diagnosis not present

## 2020-07-19 DIAGNOSIS — R296 Repeated falls: Secondary | ICD-10-CM | POA: Diagnosis not present

## 2020-07-19 DIAGNOSIS — Z9181 History of falling: Secondary | ICD-10-CM | POA: Diagnosis not present

## 2020-07-21 DIAGNOSIS — Z9181 History of falling: Secondary | ICD-10-CM | POA: Diagnosis not present

## 2020-07-21 DIAGNOSIS — R296 Repeated falls: Secondary | ICD-10-CM | POA: Diagnosis not present

## 2020-07-21 DIAGNOSIS — R262 Difficulty in walking, not elsewhere classified: Secondary | ICD-10-CM | POA: Diagnosis not present

## 2020-07-21 DIAGNOSIS — R260 Ataxic gait: Secondary | ICD-10-CM | POA: Diagnosis not present

## 2020-07-25 ENCOUNTER — Telehealth: Payer: Self-pay | Admitting: Family Medicine

## 2020-07-25 NOTE — Telephone Encounter (Signed)
Left message for patient to call back and schedule Medicare Annual Wellness Visit (AWV) either virtually or in office.   Last AWV 05/27/19 please schedule at anytime with [facility] Nurse Health Advisor 1 or 2   This should be a 45 minute visit.

## 2020-07-26 DIAGNOSIS — R296 Repeated falls: Secondary | ICD-10-CM | POA: Diagnosis not present

## 2020-07-26 DIAGNOSIS — R260 Ataxic gait: Secondary | ICD-10-CM | POA: Diagnosis not present

## 2020-07-26 DIAGNOSIS — Z9181 History of falling: Secondary | ICD-10-CM | POA: Diagnosis not present

## 2020-07-26 DIAGNOSIS — R262 Difficulty in walking, not elsewhere classified: Secondary | ICD-10-CM | POA: Diagnosis not present

## 2020-07-28 DIAGNOSIS — R296 Repeated falls: Secondary | ICD-10-CM | POA: Diagnosis not present

## 2020-07-28 DIAGNOSIS — R260 Ataxic gait: Secondary | ICD-10-CM | POA: Diagnosis not present

## 2020-07-28 DIAGNOSIS — R262 Difficulty in walking, not elsewhere classified: Secondary | ICD-10-CM | POA: Diagnosis not present

## 2020-07-28 DIAGNOSIS — Z9181 History of falling: Secondary | ICD-10-CM | POA: Diagnosis not present

## 2020-08-02 DIAGNOSIS — Z9181 History of falling: Secondary | ICD-10-CM | POA: Diagnosis not present

## 2020-08-02 DIAGNOSIS — R262 Difficulty in walking, not elsewhere classified: Secondary | ICD-10-CM | POA: Diagnosis not present

## 2020-08-02 DIAGNOSIS — R260 Ataxic gait: Secondary | ICD-10-CM | POA: Diagnosis not present

## 2020-08-02 DIAGNOSIS — R296 Repeated falls: Secondary | ICD-10-CM | POA: Diagnosis not present

## 2020-08-04 ENCOUNTER — Telehealth: Payer: Self-pay | Admitting: Family Medicine

## 2020-08-04 DIAGNOSIS — Z9181 History of falling: Secondary | ICD-10-CM | POA: Diagnosis not present

## 2020-08-04 DIAGNOSIS — R296 Repeated falls: Secondary | ICD-10-CM | POA: Diagnosis not present

## 2020-08-04 DIAGNOSIS — R262 Difficulty in walking, not elsewhere classified: Secondary | ICD-10-CM | POA: Diagnosis not present

## 2020-08-04 DIAGNOSIS — R260 Ataxic gait: Secondary | ICD-10-CM | POA: Diagnosis not present

## 2020-08-04 NOTE — Telephone Encounter (Signed)
left message asking pt to call  4/28/rbh please let pt know appointment 09/01/20 time has changed from 1:15 to 1

## 2020-09-01 ENCOUNTER — Ambulatory Visit: Payer: Medicare Other

## 2020-09-01 DIAGNOSIS — R260 Ataxic gait: Secondary | ICD-10-CM | POA: Diagnosis not present

## 2020-09-01 DIAGNOSIS — Z9181 History of falling: Secondary | ICD-10-CM | POA: Diagnosis not present

## 2020-09-01 DIAGNOSIS — R296 Repeated falls: Secondary | ICD-10-CM | POA: Diagnosis not present

## 2020-09-01 DIAGNOSIS — R262 Difficulty in walking, not elsewhere classified: Secondary | ICD-10-CM | POA: Diagnosis not present

## 2020-09-01 NOTE — Progress Notes (Deleted)
Subjective:   Natasha Frederick is a 65 y.o. female who presents for Medicare Annual (Subsequent) preventive examination.  Review of Systems    ***       Objective:    There were no vitals filed for this visit. There is no height or weight on file to calculate BMI.  Advanced Directives 05/27/2019 11/24/2018 11/11/2018 09/16/2018 09/04/2018 08/08/2018 08/06/2018  Does Patient Have a Medical Advance Directive? Yes Yes Yes No No Yes Yes  Type of Estate agent of [place];Living will Healthcare Power of [place];Living will Healthcare Power of [place];Living will - - Healthcare Power of [employer] Power of Attorney  Does patient want to make changes to medical advance directive? No - Patient declined No - Patient declined No - Patient declined - - No - Patient declined -  Copy of Healthcare Power of Attorney in Chart? No - copy requested - No - copy requested - - No - copy requested No - copy requested  Would patient like information on creating a medical advance directive? - - - No - Patient declined No - Patient declined No - Guardian declined -    Current Medications (verified) Outpatient Encounter Medications as of 09/01/2020  Medication Sig  . acetaminophen (TYLENOL) 500 MG tablet Take 1,000 mg by mouth every morning.  . Deutetrabenazine (AUSTEDO) 12 MG TABS Take 24 mg by mouth 2 (two) times daily.  Marland Kitchen oxyCODONE-acetaminophen (PERCOCET/ROXICET) 5-325 MG tablet Take 1 tablet by mouth every 8 (eight) hours as needed for severe pain.  Marland Kitchen sennosides-docusate sodium (SENOKOT-S) 8.6-50 MG tablet Take 1 tablet by mouth daily.  . sertraline (ZOLOFT) 100 MG tablet TAKE 1 TABLET(100 MG) BY MOUTH DAILY   No facility-administered encounter medications on file as of 09/01/2020.    Allergies (verified) Bee venom, Nsaids, Adhesive [tape], Codeine, Penicillins, and Sulfa antibiotics   History: Past Medical History:  Diagnosis Date  . Acute embolism and thrombosis of deep vein  of both distal lower extremities (HCC)   . Arthritis    hip  . C. difficile diarrhea   . Endometriosis   . Huntington disease (HCC)   . Peptic ulcer   . Perforated gastric ulcer (HCC)   . Scoliosis    Past Surgical History:  Procedure Laterality Date  . AMPUTATION Right 08/08/2018   Procedure: AMPUTATION BELOW KNEE RIGHT LOWER EXTREMITY;  Surgeon: Larina Earthly, MD;  [facility];  Service: Vascular;  Laterality: Right;  . ANTERIOR LAT LUMBAR FUSION Left 01/05/2014   Procedure: LUMBAR TWO TO THREE, LUMBAR LUMBAR THREE TO FOUR, ANTERIOR LATERAL LUMBAR FUSION 2 LEVELS;  Surgeon: Reinaldo Meeker, MD;  [facility];  Service: Neurosurgery;  Laterality: Left;  . APPENDECTOMY  1963  . BIOPSY  11/25/2018   Procedure: BIOPSY;  Surgeon: Sherrilyn Rist, MD;  [facility]: [facility];  Service: Gastroenterology;;  . BREAST SURGERY Left 1987   bx  . COLONOSCOPY W/ POLYPECTOMY    . ESOPHAGOGASTRODUODENOSCOPY (EGD) WITH PROPOFOL N/A 11/25/2018   Procedure: ESOPHAGOGASTRODUODENOSCOPY (EGD) WITH PROPOFOL;  Surgeon: Sherrilyn Rist, MD;  [facility];  Service: Gastroenterology;  Laterality: N/A;  . EYE SURGERY Bilateral   . FEMORAL-POPLITEAL BYPASS GRAFT Bilateral 08/06/2018   Procedure: BILATERAL POPLITEAL AND TIBIAL EMBOLECTOMIES, LEFT LEG FASCIOTOMY;  Surgeon: Larina Earthly, MD;  [facility];  Service: Vascular;  Laterality: Bilateral;  . LAPAROSCOPY N/A 07/30/2018   Procedure: DIAGNOSTIC LAPAROSCOPY, Omentopexy gram patch, Washout of intra-abdominal abcesses x 3;  Surgeon:  Karie SodaGross, Steven, MD;  Location[facility];  Service: General;  Laterality: N/A;  . OOPHORECTOMY Left 1977  . Planters wart Left 2005  . radial keratoto Bilateral   . TONSILLECTOMY  1958  . TOTAL HIP ARTHROPLASTY Left 07/19/2014   Procedure: TOTAL HIP ARTHROPLASTY;  Surgeon: Gean BirchwoodFrank Rowan, MD;  [facility];  Service: Orthopedics;  Laterality: Left;  . TUBAL LIGATION  1986   Family History  Problem  Relation Age of Onset  . Lung cancer Mother   . Stroke Father   . Huntington's disease Paternal Grandmother   . Huntington's disease Paternal Aunt   . Huntington's disease Paternal Uncle   . Arthritis Neg Hx        family hx  . Cancer Neg Hx        prostate ca -family hx  . Heart disease Neg Hx        family hx  . Colon cancer Neg Hx   . Esophageal cancer Neg Hx   . Pancreatic cancer Neg Hx   . Liver disease Neg Hx    Social History   Socioeconomic History  . Marital status: Married    Spouse name: Not on file  . Number of children: Not on file  . Years of education: Not on file  . Highest education level: Not on file  Occupational History  . Occupation: unemployed    Comment: disabled back pain  Tobacco Use  . Smoking status: Former Smoker    Packs/day: 1.00    Years: 8.00    Pack years: 8.00    Types: Cigarettes    Quit date: 01/17/1994    Years since quitting: 26.6  . Smokeless tobacco: Never Used  Vaping Use  . Vaping Use: Never used  Substance and Sexual Activity  . Alcohol use: Yes    Alcohol/week: 3.0 standard drinks    Types: 3 Cans of beer per week    Comment: per week  . Drug use: No  . Sexual activity: Not on file  Other Topics Concern  . Not on file  Social History Narrative   Married   Right BKA    Disabled due to back problems       Social Determinants of Health   Financial Resource Strain: Not on file  Food Insecurity: Not on file  Transportation Needs: Not on file  Physical Activity: Not on file  Stress: Not on file  Social Connections: Not on file    Tobacco Counseling Counseling given: Not Answered   Clinical Intake:                 Diabetic?***         Activities of Daily Living No flowsheet data found.  Patient Care Team: Kristian CoveyBurchette, Bruce W, MD as PCP - General (Family Medicine) Aliene BeamsKritzer, Randy, MD as Referring Physician (Neurosurgery) Tat, Octaviano Battyebecca S, DO as Consulting Physician (Neurology) Karie SodaGross, Steven,  MD as Consulting Physician (General Surgery) Wynema Birchate, Jessica Amelia, MD as Consulting Physician (Neurology)  Indicate any recent Medical Services you may have received from other than [facility] providers in the past year (date may be approximate).     Assessment:   This is a routine wellness examination for Bosie ClosJudith.  Hearing/Vision screen No exam data present  Dietary issues and exercise activities discussed:    Goals Addressed   None    Depression Screen PHQ 2/9 Scores 05/27/2019  PHQ - 2 Score 4  PHQ- 9 Score 4    Fall Risk Fall  Risk  05/27/2019 12/13/2017 11/29/2017 10/22/2017  Falls in the past year? 1 Yes No No  Number falls in past yr: 0 2 or more - -  Injury with Fall? 0 No - -  Risk Factor Category  - High Fall Risk - -  Risk for fall due to : Impaired mobility;History of fall(s);Medication side effect - - -  Follow up Falls evaluation completed;Education provided;Falls prevention discussed Falls evaluation completed - -    FALL RISK PREVENTION PERTAINING TO THE HOME:  Any stairs in or around the home? {YES/NO:21197} If so, are there any without handrails? {YES/NO:21197} Home free of loose throw rugs in walkways, pet beds, electrical cords, etc? {YES/NO:21197} Adequate lighting in your home to reduce risk of falls? {YES/NO:21197}  ASSISTIVE DEVICES UTILIZED TO PREVENT FALLS:  Life alert? {YES/NO:21197} Use of a cane, walker or w/c? {YES/NO:21197} Grab bars in the bathroom? {YES/NO:21197} Shower chair or bench in shower? {YES/NO:21197} Elevated toilet seat or a handicapped toilet? {YES/NO:21197}  TIMED UP AND GO:  Was the test performed? {YES/NO:21197}.  Length of time to ambulate 10 feet: *** sec.   {Appearance of Gait:2101803}  Cognitive Function: MMSE - Mini Mental State Exam 10/09/2017  Orientation to time 5  Orientation to Place 5  Registration 3  Attention/ Calculation 5  Recall 3  Language- name 2 objects 2  Language- repeat 1  Language- follow 3 step  command 3  Language- read & follow direction 1  Write a sentence 1  Copy design 0  Total score 29     6CIT Screen 05/27/2019  What Year? 0 points  What month? 0 points  What time? 0 points  Count back from 20 0 points  Months in reverse 0 points  Repeat phrase 0 points  Total Score 0    Immunizations Immunization History  Administered Date(s) Administered  . Fluad Quad(high Dose 65+) 12/19/2018  . HiB (PRP-T) 08/12/2018  . Influenza Split 01/31/2011  . Influenza, High Dose Seasonal PF 12/17/2016  . Influenza,inj,Quad PF,6+ Mos 03/10/2013, 03/23/2014  . Influenza-Unspecified 12/01/2015  . Meningococcal Mcv4o 08/11/2018  . Pneumococcal Conjugate-13 08/11/2018  . Pneumococcal Polysaccharide-23 02/07/2006  . Td 04/09/2002  . Tdap 10/09/2017  . Zoster Recombinat (Shingrix) 10/14/2017, 12/22/2017  . Zoster, Live 01/31/2011    {TDAP status:2101805}  {Flu Vaccine status:2101806}  {Pneumococcal vaccine status:2101807}  {Covid-19 vaccine status:2101808}  Qualifies for Shingles Vaccine? {YES/NO:21197}  Zostavax completed {YES/NO:21197}  {Shingrix Completed?:2101804}  Screening Tests Health Maintenance  Topic Date Due  . COVID-19 Vaccine (1) Never done  . MAMMOGRAM  Never done  . Zoster Vaccines- Shingrix (1 of 2) 05/14/2003  . COLONOSCOPY (Pts 45-7yrs Insurance coverage will need to be confirmed)  01/07/2013  . DEXA SCAN  Never done  . PNA vac Low Risk Adult (2 of 2 - PPSV23) 08/11/2019  . INFLUENZA VACCINE  11/07/2020  . TETANUS/TDAP  10/10/2027  . Hepatitis C Screening  Completed  . HPV VACCINES  Aged Out    Health Maintenance  Health Maintenance Due  Topic Date Due  . COVID-19 Vaccine (1) Never done  . MAMMOGRAM  Never done  . Zoster Vaccines- Shingrix (1 of 2) 05/14/2003  . COLONOSCOPY (Pts 45-47yrs Insurance coverage will need to be confirmed)  01/07/2013  . DEXA SCAN  Never done  . PNA vac Low Risk Adult (2 of 2 - PPSV23) 08/11/2019    {Colorectal  cancer screening:2101809}  {Mammogram status:21018020}  {Bone Density status:21018021}  Lung Cancer Screening: (Low Dose CT Chest recommended  if Age 4-80 years, 30 pack-year currently smoking OR have quit w/in 15years.) {DOES NOT does:27190::"does not"} qualify.   Lung Cancer Screening Referral: ***  Additional Screening:  Hepatitis C Screening: {DOES NOT does:27190::"does not"} qualify; Completed ***  Vision Screening: Recommended annual ophthalmology exams for early detection of glaucoma and other disorders of the eye. Is the patient up to date with their annual eye exam?  {YES/NO:21197} Who is the provider or what is the name of the office in which the patient attends annual eye exams? *** If pt is not established with a provider, would they like to be referred to a provider to establish care? {YES/NO:21197}.   Dental Screening: Recommended annual dental exams for proper oral hygiene  Community Resource Referral / Chronic Care Management: CRR required this visit?  {YES/NO:21197}  CCM required this visit?  {YES/NO:21197}     Plan:     I have personally reviewed and noted the following in the patient's chart:   . Medical and social history . Use of alcohol, tobacco or illicit drugs  . Current medications and supplements including opioid prescriptions.  . Functional ability and status . Nutritional status . Physical activity . Advanced directives . List of other physicians . Hospitalizations, surgeries, and ER visits in previous 12 months . Vitals . Screenings to include cognitive, depression, and falls . Referrals and appointments  In addition, I have reviewed and discussed with patient certain preventive protocols, quality metrics, and best practice recommendations. A written personalized care plan for preventive services as well as general preventive health recommendations were provided to patient.     March Rummage, LPN   9/98/3382   Nurse Notes: ***

## 2020-09-06 DIAGNOSIS — R260 Ataxic gait: Secondary | ICD-10-CM | POA: Diagnosis not present

## 2020-09-06 DIAGNOSIS — Z9181 History of falling: Secondary | ICD-10-CM | POA: Diagnosis not present

## 2020-09-06 DIAGNOSIS — R262 Difficulty in walking, not elsewhere classified: Secondary | ICD-10-CM | POA: Diagnosis not present

## 2020-09-06 DIAGNOSIS — R296 Repeated falls: Secondary | ICD-10-CM | POA: Diagnosis not present

## 2020-09-08 DIAGNOSIS — Z9181 History of falling: Secondary | ICD-10-CM | POA: Diagnosis not present

## 2020-09-08 DIAGNOSIS — R262 Difficulty in walking, not elsewhere classified: Secondary | ICD-10-CM | POA: Diagnosis not present

## 2020-09-08 DIAGNOSIS — R260 Ataxic gait: Secondary | ICD-10-CM | POA: Diagnosis not present

## 2020-09-08 DIAGNOSIS — R296 Repeated falls: Secondary | ICD-10-CM | POA: Diagnosis not present

## 2020-09-13 ENCOUNTER — Telehealth: Payer: Self-pay | Admitting: Family Medicine

## 2020-09-13 MED ORDER — OXYCODONE-ACETAMINOPHEN 5-325 MG PO TABS: 1.0000 | ORAL_TABLET | Freq: Three times a day (TID) | ORAL | 0 refills | Status: DC | PRN

## 2020-09-13 NOTE — Telephone Encounter (Signed)
Pt requesting a refill  For oxyCODONE-acetaminophen (PERCOCET/ROXICET) 5-325 MG tablet  [id number] - [place],  - [address]  Phone:  [phone] Fax:  [phone]

## 2020-09-13 NOTE — Telephone Encounter (Signed)
Last filled 07/15/2020 Last OV 05/04/2020  Ok to fill?

## 2020-09-14 ENCOUNTER — Ambulatory Visit (INDEPENDENT_AMBULATORY_CARE_PROVIDER_SITE_OTHER): Payer: Medicare Other

## 2020-09-14 ENCOUNTER — Other Ambulatory Visit: Payer: Self-pay

## 2020-09-14 ENCOUNTER — Encounter: Payer: Self-pay | Admitting: Family Medicine

## 2020-09-14 ENCOUNTER — Ambulatory Visit (INDEPENDENT_AMBULATORY_CARE_PROVIDER_SITE_OTHER): Payer: Medicare Other | Admitting: Family Medicine

## 2020-09-14 VITALS — BP 130/60 | HR 91 | Temp 98.6°F | Wt 139.1 lb

## 2020-09-14 VITALS — BP 142/72 | HR 72 | Temp 98.6°F | Ht 64.0 in | Wt 139.0 lb

## 2020-09-14 DIAGNOSIS — Z0000 Encounter for general adult medical examination without abnormal findings: Secondary | ICD-10-CM

## 2020-09-14 DIAGNOSIS — G8929 Other chronic pain: Secondary | ICD-10-CM

## 2020-09-14 DIAGNOSIS — M545 Low back pain, unspecified: Secondary | ICD-10-CM | POA: Diagnosis not present

## 2020-09-14 DIAGNOSIS — F339 Major depressive disorder, recurrent, unspecified: Secondary | ICD-10-CM | POA: Diagnosis not present

## 2020-09-14 DIAGNOSIS — L84 Corns and callosities: Secondary | ICD-10-CM | POA: Diagnosis not present

## 2020-09-14 DIAGNOSIS — G10 Huntington's disease: Secondary | ICD-10-CM

## 2020-09-14 NOTE — Progress Notes (Signed)
Established Patient Office Visit  Subjective:  Patient ID: Natasha Frederick, female    DOB: 1956-03-31  Age: 65 y.o. MRN: [id number]  CC:  Chief Complaint  Patient presents with  . Cyst    Reoccurring issue     HPI Natasha Frederick presents for several items  She has callused area left buttock which we have trimmed down the past.  This feels better afterwards but tends to get recurrent callus in this region.  She is trying to walk more.  She has Huntington's chorea and is followed at [facility] for that.  She recently was changed to Risperdal 0.5 mg twice daily and that seems to be helping somewhat with her abnormal movements.  She has history of recurrent depression.  She is on sertraline 100 mg daily and feels like that is working fairly well.  No suicidal ideation.  She has chronic back pain and has managed to get by with low-dose oxycodone 1 tablet daily.  She does feel this helps her to be more functional.  She has had occasional constipation and is currently taking senna for that.  She tried over-the-counter Tylenol without much relief.  Cannot take nonsteroidals.  Past history of perforated ulcer  Past Medical History:  Diagnosis Date  . Acute embolism and thrombosis of deep vein of both distal lower extremities (HCC)   . Arthritis    hip  . C. difficile diarrhea   . Endometriosis   . Huntington disease (HCC)   . Peptic ulcer   . Perforated gastric ulcer (HCC)   . Scoliosis     Past Surgical History:  Procedure Laterality Date  . AMPUTATION Right 08/08/2018   Procedure: AMPUTATION BELOW KNEE RIGHT LOWER EXTREMITY;  Surgeon: Larina Earthly, MD;  [facility];  Service: Vascular;  Laterality: Right;  . ANTERIOR LAT LUMBAR FUSION Left 01/05/2014   Procedure: LUMBAR TWO TO THREE, LUMBAR LUMBAR THREE TO FOUR, ANTERIOR LATERAL LUMBAR FUSION 2 LEVELS;  Surgeon: Reinaldo Meeker, MD;  [facility];  Service: Neurosurgery;  Laterality: Left;  . APPENDECTOMY  1963  . BIOPSY   11/25/2018   Procedure: BIOPSY;  Surgeon: Sherrilyn Rist, MD;  [facility];  Service: Gastroenterology;;  . BREAST SURGERY Left 1987   bx  . COLONOSCOPY W/ POLYPECTOMY    . ESOPHAGOGASTRODUODENOSCOPY (EGD) WITH PROPOFOL N/A 11/25/2018   Procedure: ESOPHAGOGASTRODUODENOSCOPY (EGD) WITH PROPOFOL;  Surgeon: Sherrilyn Rist, MD;  [facility];  Service: Gastroenterology;  Laterality: N/A;  . EYE SURGERY Bilateral   . FEMORAL-POPLITEAL BYPASS GRAFT Bilateral 08/06/2018   Procedure: BILATERAL POPLITEAL AND TIBIAL EMBOLECTOMIES, LEFT LEG FASCIOTOMY;  Surgeon: Larina Earthly, MD;  [facility];  Service: Vascular;  Laterality: Bilateral;  . LAPAROSCOPY N/A 07/30/2018   Procedure: DIAGNOSTIC LAPAROSCOPY, Omentopexy gram patch, Washout of intra-abdominal abcesses x 3;  Surgeon: Karie Soda, MD;  [facility];  Service: General;  Laterality: N/A;  . OOPHORECTOMY Left 1977  . Planters wart Left 2005  . radial keratoto Bilateral   . TONSILLECTOMY  1958  . TOTAL HIP ARTHROPLASTY Left 07/19/2014   Procedure: TOTAL HIP ARTHROPLASTY;  Surgeon: Gean Birchwood, MD;  [facility];  Service: Orthopedics;  Laterality: Left;  . TUBAL LIGATION  1986    Family History  Problem Relation Age of Onset  . Lung cancer Mother   . Stroke Father   . Huntington's disease Paternal Grandmother   . Huntington's disease Paternal Aunt   . Huntington's  disease Paternal Uncle   . Arthritis Neg Hx        family hx  . Cancer Neg Hx        prostate ca -family hx  . Heart disease Neg Hx        family hx  . Colon cancer Neg Hx   . Esophageal cancer Neg Hx   . Pancreatic cancer Neg Hx   . Liver disease Neg Hx     Social History   Socioeconomic History  . Marital status: Married    Spouse name: Not on file  . Number of children: Not on file  . Years of education: Not on file  . Highest education level: Not on file  Occupational History  . Occupation: unemployed    Comment: disabled  back pain  Tobacco Use  . Smoking status: Former Smoker    Packs/day: 1.00    Years: 8.00    Pack years: 8.00    Types: Cigarettes    Quit date: 01/17/1994    Years since quitting: 26.6  . Smokeless tobacco: Never Used  Vaping Use  . Vaping Use: Never used  Substance and Sexual Activity  . Alcohol use: Yes    Alcohol/week: 3.0 standard drinks    Types: 3 Cans of beer per week    Comment: per week  . Drug use: No  . Sexual activity: Not on file  Other Topics Concern  . Not on file  Social History Narrative   Married   Right BKA    Disabled due to back problems       Social Determinants of Health   Financial Resource Strain: Low Risk   . Difficulty of Paying Living Expenses: Not hard at all  Food Insecurity: No Food Insecurity  . Worried About Programme researcher, broadcasting/film/video in the Last Year: Never true  . Ran Out of Food in the Last Year: Never true  Transportation Needs: No Transportation Needs  . Lack of Transportation (Medical): No  . Lack of Transportation (Non-Medical): No  Physical Activity: Inactive  . Days of Exercise per Week: 0 days  . Minutes of Exercise per Session: 0 min  Stress: No Stress Concern Present  . Feeling of Stress : Not at all  Social Connections: Moderately Integrated  . Frequency of Communication with Friends and Family: Twice a week  . Frequency of Social Gatherings with Friends and Family: Twice a week  . Attends Religious Services: Never  . Active Member of Clubs or Organizations: Yes  . Attends Banker Meetings: More than 4 times per year  . Marital Status: Married  Catering manager Violence: Not At Risk  . Fear of Current or Ex-Partner: No  . Emotionally Abused: No  . Physically Abused: No  . Sexually Abused: No    Outpatient Medications Prior to Visit  Medication Sig Dispense Refill  . acetaminophen (TYLENOL) 500 MG tablet Take 1,000 mg by mouth every morning.    Marland Kitchen oxyCODONE-acetaminophen (PERCOCET/ROXICET) 5-325 MG tablet  Take 1 tablet by mouth every 8 (eight) hours as needed for severe pain. 60 tablet 0  . risperiDONE (RISPERDAL) 0.5 MG tablet Take 0.5 mg by mouth 2 (two) times daily.    . sennosides-docusate sodium (SENOKOT-S) 8.6-50 MG tablet Take 1 tablet by mouth daily.    . sertraline (ZOLOFT) 100 MG tablet TAKE 1 TABLET(100 MG) BY MOUTH DAILY 90 tablet 0  . Deutetrabenazine (AUSTEDO) 12 MG TABS Take 24 mg by mouth 2 (two) times  daily. 360 tablet 1   No facility-administered medications prior to visit.    Allergies  Allergen Reactions  . Bee Venom Anaphylaxis and Swelling  . Nsaids Other (See Comments)    PERFORATED ULCER = NO MORE NSAIDs  . Adhesive [Tape] Other (See Comments)    redness  . Codeine Nausea And Vomiting  . Penicillins     Did it involve swelling of the face/tongue/throat, SOB, or low BP? Yes Did it involve sudden or severe rash/hives, skin peeling, or any reaction on the inside of your mouth or nose? Unknown Did you need to seek medical attention at a hospital or doctor's office? Yes When did it last happen?age 8-40 If all above answers are "NO", may proceed with cephalosporin use.   . Sulfa Antibiotics Itching    ROS Review of Systems  Constitutional: Negative for chills, fatigue and fever.  Eyes: Negative for visual disturbance.  Respiratory: Negative for cough, chest tightness, shortness of breath and wheezing.   Cardiovascular: Negative for chest pain, palpitations and leg swelling.  Neurological: Negative for dizziness, seizures, syncope, weakness, light-headedness and headaches.      Objective:    Physical Exam Vitals reviewed.  Cardiovascular:     Rate and Rhythm: Normal rate and regular rhythm.  Pulmonary:     Effort: Pulmonary effort is normal.     Breath sounds: Normal breath sounds.  Skin:    Comments: Just small approximately 8 mm callused area left buttock.  No surrounding nodular growth.  No necrosis.  No erythema.  Neurological:     Mental  Status: She is alert.     BP 130/60 (BP Location: Left Arm, Patient Position: Sitting, Cuff Size: Normal)   Pulse 91   Temp 98.6 F (37 C) (Oral)   Wt 139 lb 1.6 oz (63.1 kg)   SpO2 95%   BMI 23.88 kg/m  Wt Readings from Last 3 Encounters:  09/14/20 139 lb 1.6 oz (63.1 kg)  09/14/20 139 lb (63 kg)  05/04/20 134 lb (60.8 kg)     Health Maintenance Due  Topic Date Due  . COVID-19 Vaccine (1) Never done  . Pneumococcal Vaccine 77-32 Years old (1 of 4 - PCV13) Never done  . MAMMOGRAM  Never done  . COLONOSCOPY (Pts 45-69yrs Insurance coverage will need to be confirmed)  01/07/2013  . DEXA SCAN  Never done  . PNA vac Low Risk Adult (2 of 2 - PPSV23) 08/11/2019    There are no preventive care reminders to display for this patient.  Lab Results  Component Value Date   TSH 2.52 10/09/2017   Lab Results  Component Value Date   WBC 16.2 (H) 08/13/2018   HGB 8.8 (L) 08/13/2018   HCT 27.4 (L) 08/13/2018   MCV 87.3 08/13/2018   PLT 1,200 (HH) 08/13/2018   Lab Results  Component Value Date   NA 136 08/13/2018   K 3.3 (L) 08/13/2018   CO2 23 08/13/2018   GLUCOSE 93 08/13/2018   BUN 5 (L) 08/13/2018   CREATININE 0.52 08/13/2018   BILITOT 0.8 07/30/2018   ALKPHOS 52 07/30/2018   AST 44 (H) 07/30/2018   ALT 23 07/30/2018   PROT 5.8 (L) 07/30/2018   ALBUMIN 3.2 (L) 07/30/2018   CALCIUM 8.1 (L) 08/13/2018   ANIONGAP 10 08/13/2018   GFR 132.90 07/28/2018   Lab Results  Component Value Date   CHOL 200 10/09/2017   Lab Results  Component Value Date   HDL 68.70 10/09/2017  Lab Results  Component Value Date   LDLCALC 108 (H) 10/09/2017   Lab Results  Component Value Date   TRIG 113.0 10/09/2017   Lab Results  Component Value Date   CHOLHDL 3 10/09/2017   No results found for: HGBA1C    Assessment & Plan:   #1 recurrent skin callus left buttock.  This does not have any atypical features.  This is something we have trimmed periodically usually every few  months and she does get symptomatic relief afterwards.  She has tried various types of padding but is very sensitive to adhesive. We discussed risk of trimming with #15 blade including risk of bleeding and patient consents.  Use #15 blade and pared down hard and callused center.  She tolerated well and felt better afterwards  #2 recurrent depression currently stable on sertraline 100 mg daily -Continue sertraline 100 mg daily -She will need refills in July  #3 [facility]'s [facility] with recent change in therapy to Risperdal  #4 chronic low back pain.  Patient is on low-dose oxycodone and tolerating well except for some mild constipation.  Back pain is improved with this    No orders of the defined types were placed in this encounter.   Follow-up: No follow-ups on file.    Evelena PeatBruce Crestina Strike, MD

## 2020-09-14 NOTE — Progress Notes (Signed)
Subjective:   Natasha Frederick is a 65 y.o. female who presents for an Initial Medicare Annual Wellness Visit.  Review of Systems    N/a        Objective:    There were no vitals filed for this visit. There is no height or weight on file to calculate BMI.  Advanced Directives 05/27/2019 11/24/2018 11/11/2018 09/16/2018 09/04/2018 08/08/2018 08/06/2018  Does Patient Have a Medical Advance Directive? Yes Yes Yes No No Yes Yes  Type of Estate agent of [place];Living will Healthcare Power of [place];Living will Healthcare Power of [place];Living will - - Healthcare Power of [employer] Power of Attorney  Does patient want to make changes to medical advance directive? No - Patient declined No - Patient declined No - Patient declined - - No - Patient declined -  Copy of Healthcare Power of Attorney in Chart? No - copy requested - No - copy requested - - No - copy requested No - copy requested  Would patient like information on creating a medical advance directive? - - - No - Patient declined No - Patient declined No - Guardian declined -    Current Medications (verified) Outpatient Encounter Medications as of 09/14/2020  Medication Sig  . acetaminophen (TYLENOL) 500 MG tablet Take 1,000 mg by mouth every morning.  . Deutetrabenazine (AUSTEDO) 12 MG TABS Take 24 mg by mouth 2 (two) times daily.  Marland Kitchen oxyCODONE-acetaminophen (PERCOCET/ROXICET) 5-325 MG tablet Take 1 tablet by mouth every 8 (eight) hours as needed for severe pain.  Marland Kitchen sennosides-docusate sodium (SENOKOT-S) 8.6-50 MG tablet Take 1 tablet by mouth daily.  . sertraline (ZOLOFT) 100 MG tablet TAKE 1 TABLET(100 MG) BY MOUTH DAILY   No facility-administered encounter medications on file as of 09/14/2020.    Allergies (verified) Bee venom, Nsaids, Adhesive [tape], Codeine, Penicillins, and Sulfa antibiotics   History: Past Medical History:  Diagnosis Date  . 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 Arthritis    hip  . C. difficile diarrhea   . Endometriosis   . Huntington disease (HCC)   . Peptic ulcer   . Perforated gastric ulcer (HCC)   . Scoliosis    Past Surgical History:  Procedure Laterality Date  . AMPUTATION Right 08/08/2018   Procedure: AMPUTATION BELOW KNEE RIGHT LOWER EXTREMITY;  Surgeon: Larina Earthly, MD;  Location: [facility];  Service: Vascular;  Laterality: Right;  . ANTERIOR LAT LUMBAR FUSION Left 01/05/2014   Procedure: LUMBAR TWO TO THREE, LUMBAR LUMBAR THREE TO FOUR, ANTERIOR LATERAL LUMBAR FUSION 2 LEVELS;  Surgeon: Reinaldo Meeker, MD;  Location: [facility];  Service: Neurosurgery;  Laterality: Left;  . APPENDECTOMY  1963  . BIOPSY  11/25/2018   Procedure: BIOPSY;  Surgeon: Sherrilyn Rist, MD;  Location: [facility];  Service: Gastroenterology;;  . BREAST SURGERY Left 1987   bx  . COLONOSCOPY W/ POLYPECTOMY    . ESOPHAGOGASTRODUODENOSCOPY (EGD) WITH PROPOFOL N/A 11/25/2018   Procedure: ESOPHAGOGASTRODUODENOSCOPY (EGD) WITH PROPOFOL;  Surgeon: Sherrilyn Rist, MD;  Location: [facility];  Service: Gastroenterology;  Laterality: N/A;  . EYE SURGERY Bilateral   . FEMORAL-POPLITEAL BYPASS GRAFT Bilateral 08/06/2018   Procedure: BILATERAL POPLITEAL AND TIBIAL EMBOLECTOMIES, LEFT LEG FASCIOTOMY;  Surgeon: Larina Earthly, MD;  Location: [facility];  Service: Vascular;  Laterality: Bilateral;  . LAPAROSCOPY N/A 07/30/2018   Procedure: DIAGNOSTIC LAPAROSCOPY, Omentopexy gram patch, Washout of intra-abdominal abcesses x 3;  Surgeon: Karie Soda, MD;  Location: [facility];  Service: General;  Laterality: N/A;  . OOPHORECTOMY Left 1977  . Planters wart Left 2005  . radial keratoto Bilateral   . TONSILLECTOMY  1958  . TOTAL HIP ARTHROPLASTY Left 07/19/2014   Procedure: TOTAL HIP ARTHROPLASTY;  Surgeon: Gean Birchwood, MD;  Location: [facility];  Service: Orthopedics;  Laterality: Left;  . TUBAL LIGATION  1986   Family History  Problem Relation Age  of Onset  . Lung cancer Mother   . Stroke Father   . Huntington's disease Paternal Grandmother   . Huntington's disease Paternal Aunt   . Huntington's disease Paternal Uncle   . Arthritis Neg Hx        family hx  . Cancer Neg Hx        prostate ca -family hx  . Heart disease Neg Hx        family hx  . Colon cancer Neg Hx   . Esophageal cancer Neg Hx   . Pancreatic cancer Neg Hx   . Liver disease Neg Hx    Social History   Socioeconomic History  . Marital status: Married    Spouse name: Not on file  . Number of children: Not on file  . Years of education: Not on file  . Highest education level: Not on file  Occupational History  . Occupation: unemployed    Comment: disabled back pain  Tobacco Use  . Smoking status: Former Smoker    Packs/day: 1.00    Years: 8.00    Pack years: 8.00    Types: Cigarettes    Quit date: 01/17/1994    Years since quitting: 26.6  . Smokeless tobacco: Never Used  Vaping Use  . Vaping Use: Never used  Substance and Sexual Activity  . Alcohol use: Yes    Alcohol/week: 3.0 standard drinks    Types: 3 Cans of beer per week    Comment: per week  . Drug use: No  . Sexual activity: Not on file  Other Topics Concern  . Not on file  Social History Narrative   Married   Right BKA    Disabled due to back problems       Social Determinants of Health   Financial Resource Strain: Not on file  Food Insecurity: Not on file  Transportation Needs: Not on file  Physical Activity: Not on file  Stress: Not on file  Social Connections: Not on file    Tobacco Counseling Counseling given: Not Answered   Clinical Intake:                 Diabetic?no         Activities of Daily Living No flowsheet data found.  Patient Care Team: Kristian Covey, MD as PCP - General (Family Medicine) Aliene Beams, MD as Referring Physician (Neurosurgery) Tat, Octaviano Batty, DO as Consulting Physician (Neurology) Karie Soda, MD as  Consulting Physician (General Surgery) Wynema Birch, MD as Consulting Physician (Neurology)  Indicate any recent Medical Services you may have received from other than [facility] providers in the past year (date may be approximate).     Assessment:   This is a routine wellness examination for [place].  Hearing/Vision screen No exam data present  Dietary issues and exercise activities discussed:    Goals Addressed   None    Depression Screen PHQ 2/9 Scores 05/27/2019  PHQ - 2 Score 4  PHQ- 9 Score 4    Fall Risk  Fall Risk  05/27/2019 12/13/2017 11/29/2017 10/22/2017  Falls in the past year? 1 Yes No No  Number falls in past yr: 0 2 or more - -  Injury with Fall? 0 No - -  Risk Factor Category  - High Fall Risk - -  Risk for fall due to : Impaired mobility;History of fall(s);Medication side effect - - -  Follow up Falls evaluation completed;Education provided;Falls prevention discussed Falls evaluation completed - -    FALL RISK PREVENTION PERTAINING TO THE HOME:  Any stairs in or around the home? Yes  If so, are there any without handrails? No  Home free of loose throw rugs in walkways, pet beds, electrical cords, etc? Yes  Adequate lighting in your home to reduce risk of falls? Yes   ASSISTIVE DEVICES UTILIZED TO PREVENT FALLS:  Life alert? No  Use of a cane, walker or w/c? Yes  Grab bars in the bathroom? Yes  Shower chair or bench in shower? Yes  Elevated toilet seat or a handicapped toilet? Yes   TIMED UP AND GO:  Was the test performed? Yes .  Length of time to ambulate 10 feet: no in wheel chair  sec.   Gait unsteady with use of assistive device, provider informed and education provided.   Cognitive Function: MMSE - Mini Mental State Exam 10/09/2017  Orientation to time 5  Orientation to Place 5  Registration 3  Attention/ Calculation 5  Recall 3  Language- name 2 objects 2  Language- repeat 1  Language- follow 3 step command 3  Language- read & follow  direction 1  Write a sentence 1  Copy design 0  Total score 29     6CIT Screen 05/27/2019  What Year? 0 points  What month? 0 points  What time? 0 points  Count back from 20 0 points  Months in reverse 0 points  Repeat phrase 0 points  Total Score 0    Immunizations Immunization History  Administered Date(s) Administered  . Fluad Quad(high Dose 65+) 12/19/2018  . HiB (PRP-T) 08/12/2018  . Influenza Split 01/31/2011  . Influenza, High Dose Seasonal PF 12/17/2016  . Influenza,inj,Quad PF,6+ Mos 03/10/2013, 03/23/2014  . Influenza-Unspecified 12/01/2015  . Meningococcal Mcv4o 08/11/2018  . Pneumococcal Conjugate-13 08/11/2018  . Pneumococcal Polysaccharide-23 02/07/2006  . Td 04/09/2002  . Tdap 10/09/2017  . Zoster Recombinat (Shingrix) 10/14/2017, 12/22/2017  . Zoster, Live 01/31/2011    TDAP status: Up to date  Flu Vaccine status: Up to date  Pneumococcal vaccine status: Up to date  Covid-19 vaccine status: Completed vaccines  Qualifies for Shingles Vaccine? Yes   Zostavax completed No   Shingrix Completed?: Yes  Screening Tests Health Maintenance  Topic Date Due  . COVID-19 Vaccine (1) Never done  . Pneumococcal Vaccine 880-354 Years old (1 of 4 - PCV13) Never done  . MAMMOGRAM  Never done  . COLONOSCOPY (Pts 45-9585yrs Insurance coverage will need to be confirmed)  01/07/2013  . DEXA SCAN  Never done  . PNA vac Low Risk Adult (2 of 2 - PPSV23) 08/11/2019  . INFLUENZA VACCINE  11/07/2020  . TETANUS/TDAP  10/10/2027  . Hepatitis C Screening  Completed  . Zoster Vaccines- Shingrix  Completed  . HPV VACCINES  Aged Out    Health Maintenance  Health Maintenance Due  Topic Date Due  . COVID-19 Vaccine (1) Never done  . Pneumococcal Vaccine 190-604 Years old (1 of 4 - PCV13) Never done  . MAMMOGRAM  Never done  .  COLONOSCOPY (Pts 45-12yrs Insurance coverage will need to be confirmed)  01/07/2013  . DEXA SCAN  Never done  . PNA vac Low Risk Adult (2 of 2 -  PPSV23) 08/11/2019    Colorectal cancer screening: Referral to GI placed pt declines . Pt aware the office will call re: appt.  Mammogram status: Ordered pt declines . Pt provided with contact info and advised to call to schedule appt.   Bone Density status: Ordered pt declines . Pt provided with contact info and advised to call to schedule appt.  Lung Cancer Screening: (Low Dose CT Chest recommended if Age 33-80 years, 30 pack-year currently smoking OR have quit w/in 15years.) does not qualify.   Lung Cancer Screening Referral: no  Additional Screening:  Hepatitis C Screening: does qualify; Completed 10/09/2017  Vision Screening: Recommended annual ophthalmology exams for early detection of glaucoma and other disorders of the eye. Is the patient up to date with their annual eye exam?  Yes  Who is the provider or what is the name of the office in which the patient attends annual eye exams? [facility] If pt is not established with a provider, would they like to be referred to a provider to establish care? No .   Dental Screening: Recommended annual dental exams for proper oral hygiene  Community Resource Referral / Chronic Care Management: CRR required this visit?  No   CCM required this visit?  No      Plan:     I have personally reviewed and noted the following in the patient's chart:   . Medical and social history . Use of alcohol, tobacco or illicit drugs  . Current medications and supplements including opioid prescriptions. Patient is currently taking opioid prescriptions. Information provided to patient regarding non-opioid alternatives. Patient advised to discuss non-opioid treatment plan with their provider. . Functional ability and status . Nutritional status . Physical activity . Advanced directives . List of other physicians . Hospitalizations, surgeries, and ER visits in previous 12 months . Vitals . Screenings to include cognitive, depression, and  falls . Referrals and appointments  In addition, I have reviewed and discussed with patient certain preventive protocols, quality metrics, and best practice recommendations. A written personalized care plan for preventive services as well as general preventive health recommendations were provided to patient.     March Rummage, LPN   05/16/621   Nurse Notes: none

## 2020-09-14 NOTE — Patient Instructions (Signed)
Natasha Frederick , Thank you for taking time to come for your Medicare Wellness Visit. I appreciate your ongoing commitment to your health goals. Please review the following plan we discussed and let me know if I can assist you in the future.   Screening recommendations/referrals: Colonoscopy: pt declines  Mammogram: pt declines  Bone Density: pt declines  Recommended yearly ophthalmology/optometry visit for glaucoma screening and checkup Recommended yearly dental visit for hygiene and checkup  Vaccinations: Influenza vaccine: due fall 2022 Pneumococcal vaccine: completed series  Tdap vaccine: current  Shingles vaccine: completed series   Advanced directives: none   Conditions/risks identified: none   Next appointment: 09/14/2020 Dr. Caryl Never   Preventive Care 67 Years and Older, Female Preventive care refers to lifestyle choices and visits with your health care provider that can promote health and wellness. What does preventive care include?  A yearly physical exam. This is also called an annual well check.  Dental exams once or twice a year.  Routine eye exams. Ask your health care provider how often you should have your eyes checked.  Personal lifestyle choices, including:  Daily care of your teeth and gums.  Regular physical activity.  Eating a healthy diet.  Avoiding tobacco and drug use.  Limiting alcohol use.  Practicing safe sex.  Taking low-dose aspirin every day.  Taking vitamin and mineral supplements as recommended by your health care provider. What happens during an annual well check? The services and screenings done by your health care provider during your annual well check will depend on your age, overall health, lifestyle risk factors, and family history of disease. Counseling  Your health care provider may ask you questions about your:  Alcohol use.  Tobacco use.  Drug use.  Emotional well-being.  Home and relationship well-being.  Sexual  activity.  Eating habits.  History of falls.  Memory and ability to understand (cognition).  Work and work Astronomer.  Reproductive health. Screening  You may have the following tests or measurements:  Height, weight, and BMI.  Blood pressure.  Lipid and cholesterol levels. These may be checked every 5 years, or more frequently if you are over 67 years old.  Skin check.  Lung cancer screening. You may have this screening every year starting at age 67 if you have a 30-pack-year history of smoking and currently smoke or have quit within the past 15 years.  Fecal occult blood test (FOBT) of the stool. You may have this test every year starting at age 67.  Flexible sigmoidoscopy or colonoscopy. You may have a sigmoidoscopy every 5 years or a colonoscopy every 10 years starting at age 67.  Hepatitis C blood test.  Hepatitis B blood test.  Sexually transmitted disease (STD) testing.  Diabetes screening. This is done by checking your blood sugar (glucose) after you have not eaten for a while (fasting). You may have this done every 1-3 years.  Bone density scan. This is done to screen for osteoporosis. You may have this done starting at age 67.  Mammogram. This may be done every 1-2 years. Talk to your health care provider about how often you should have regular mammograms. Talk with your health care provider about your test results, treatment options, and if necessary, the need for more tests. Vaccines  Your health care provider may recommend certain vaccines, such as:  Influenza vaccine. This is recommended every year.  Tetanus, diphtheria, and acellular pertussis (Tdap, Td) vaccine. You may need a Td booster every 10 years.  Zoster  vaccine. You may need this after age 67.  Pneumococcal 13-valent conjugate (PCV13) vaccine. One dose is recommended after age 67.  Pneumococcal polysaccharide (PPSV23) vaccine. One dose is recommended after age 67. Talk to your health care  provider about which screenings and vaccines you need and how often you need them. This information is not intended to replace advice given to you by your health care provider. Make sure you discuss any questions you have with your health care provider. Document Released: 04/22/2015 Document Revised: 12/14/2015 Document Reviewed: 01/25/2015 Elsevier Interactive Patient Education  2017 [place] Prevention in the Home Falls can cause injuries. They can happen to people of all ages. There are many things you can do to make your home safe and to help prevent falls. What can I do on the outside of my home?  Regularly fix the edges of walkways and driveways and fix any cracks.  Remove anything that might make you trip as you walk through a door, such as a raised step or threshold.  Trim any bushes or trees on the path to your home.  Use bright outdoor lighting.  Clear any walking paths of anything that might make someone trip, such as rocks or tools.  Regularly check to see if handrails are loose or broken. Make sure that both sides of any steps have handrails.  Any raised decks and porches should have guardrails on the edges.  Have any leaves, snow, or ice cleared regularly.  Use sand or salt on walking paths during winter.  Clean up any spills in your garage right away. This includes oil or grease spills. What can I do in the bathroom?  Use night lights.  Install grab bars by the toilet and in the tub and shower. Do not use towel bars as grab bars.  Use non-skid mats or decals in the tub or shower.  If you need to sit down in the shower, use a plastic, non-slip stool.  Keep the floor dry. Clean up any water that spills on the floor as soon as it happens.  Remove soap buildup in the tub or shower regularly.  Attach bath mats securely with double-sided non-slip rug tape.  Do not have throw rugs and other things on the floor that can make you trip. What can I do in  the bedroom?  Use night lights.  Make sure that you have a light by your bed that is easy to reach.  Do not use any sheets or blankets that are too big for your bed. They should not hang down onto the floor.  Have a firm chair that has side arms. You can use this for support while you get dressed.  Do not have throw rugs and other things on the floor that can make you trip. What can I do in the kitchen?  Clean up any spills right away.  Avoid walking on wet floors.  Keep items that you use a lot in easy-to-reach places.  If you need to reach something above you, use a strong step stool that has a grab bar.  Keep electrical cords out of the way.  Do not use floor polish or wax that makes floors slippery. If you must use wax, use non-skid floor wax.  Do not have throw rugs and other things on the floor that can make you trip. What can I do with my stairs?  Do not leave any items on the stairs.  Make sure that there are handrails  on both sides of the stairs and use them. Fix handrails that are broken or loose. Make sure that handrails are as long as the stairways.  Check any carpeting to make sure that it is firmly attached to the stairs. Fix any carpet that is loose or worn.  Avoid having throw rugs at the top or bottom of the stairs. If you do have throw rugs, attach them to the floor with carpet tape.  Make sure that you have a light switch at the top of the stairs and the bottom of the stairs. If you do not have them, ask someone to add them for you. What else can I do to help prevent falls?  Wear shoes that:  Do not have high heels.  Have rubber bottoms.  Are comfortable and fit you well.  Are closed at the toe. Do not wear sandals.  If you use a stepladder:  Make sure that it is fully opened. Do not climb a closed stepladder.  Make sure that both sides of the stepladder are locked into place.  Ask someone to hold it for you, if possible.  Clearly mark and  make sure that you can see:  Any grab bars or handrails.  First and last steps.  Where the edge of each step is.  Use tools that help you move around (mobility aids) if they are needed. These include:  Canes.  Walkers.  Scooters.  Crutches.  Turn on the lights when you go into a dark area. Replace any light bulbs as soon as they burn out.  Set up your furniture so you have a clear path. Avoid moving your furniture around.  If any of your floors are uneven, fix them.  If there are any pets around you, be aware of where they are.  Review your medicines with your doctor. Some medicines can make you feel dizzy. This can increase your chance of falling. Ask your doctor what other things that you can do to help prevent falls. This information is not intended to replace advice given to you by your health care provider. Make sure you discuss any questions you have with your health care provider. Document Released: 01/20/2009 Document Revised: 09/01/2015 Document Reviewed: 04/30/2014 Elsevier Interactive Patient Education  2017 [employer].

## 2020-09-19 DIAGNOSIS — R262 Difficulty in walking, not elsewhere classified: Secondary | ICD-10-CM | POA: Diagnosis not present

## 2020-09-19 DIAGNOSIS — Z9181 History of falling: Secondary | ICD-10-CM | POA: Diagnosis not present

## 2020-09-19 DIAGNOSIS — R260 Ataxic gait: Secondary | ICD-10-CM | POA: Diagnosis not present

## 2020-09-19 DIAGNOSIS — R296 Repeated falls: Secondary | ICD-10-CM | POA: Diagnosis not present

## 2020-09-20 ENCOUNTER — Other Ambulatory Visit: Payer: Self-pay

## 2020-09-21 ENCOUNTER — Ambulatory Visit: Payer: Medicare Other | Admitting: Family Medicine

## 2020-09-26 ENCOUNTER — Encounter: Payer: Self-pay | Admitting: Family Medicine

## 2020-09-26 ENCOUNTER — Ambulatory Visit (INDEPENDENT_AMBULATORY_CARE_PROVIDER_SITE_OTHER): Payer: Medicare Other | Admitting: Family Medicine

## 2020-09-26 ENCOUNTER — Other Ambulatory Visit: Payer: Self-pay

## 2020-09-26 VITALS — BP 124/62 | HR 89 | Temp 98.1°F

## 2020-09-26 DIAGNOSIS — S91209A Unspecified open wound of unspecified toe(s) with damage to nail, initial encounter: Secondary | ICD-10-CM | POA: Diagnosis not present

## 2020-09-26 NOTE — Patient Instructions (Signed)
Clean daily with soap and water   Apply small amount of vaseline topically to toe  Follow up for any signs of infections such as redness or pus like drainage.

## 2020-09-26 NOTE — Progress Notes (Signed)
Established Patient Office Visit  Subjective:  Patient ID: Natasha Frederick, female    DOB: 04-25-1955  Age: 65 y.o. MRN: [id number]  CC:  Chief Complaint  Patient presents with   Ingrown Toenail    L foot, possible ingrown toenail, x 1 week, very painful    HPI  Natasha Frederick presents for initial complaint of possible "ingrown "toenail involving the left great toe.  She noticed that the nail looked as if this were loosened but no true ingrown nail.  She first noted about a week ago.  She has Huntington's disease and has uncontrolled movements related to this.  She is followed by neurology at [facility].  Recent adjustment in her medication.  She states that she hits her feet and legs frequently and does not recall specific trauma to the left foot but first noticed some pain about a week ago.  Does not have any history of diabetes.  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 difficile diarrhea    Endometriosis    Huntington disease (HCC)    Peptic ulcer    Perforated gastric ulcer (HCC)    Scoliosis     Past Surgical History:  Procedure Laterality Date   AMPUTATION Right 08/08/2018   Procedure: AMPUTATION BELOW KNEE RIGHT LOWER EXTREMITY;  Surgeon: Larina Earthly, MD;  Location: [facility];  Service: Vascular;  Laterality: Right;   ANTERIOR LAT LUMBAR FUSION Left 01/05/2014   Procedure: LUMBAR TWO TO THREE, LUMBAR LUMBAR THREE TO FOUR, ANTERIOR LATERAL LUMBAR FUSION 2 LEVELS;  Surgeon: Reinaldo Meeker, MD;  Location: [facility];  Service: Neurosurgery;  Laterality: Left;   APPENDECTOMY  1963   BIOPSY  11/25/2018   Procedure: BIOPSY;  Surgeon: Sherrilyn Rist, MD;  Location: [facility];  Service: Gastroenterology;;   BREAST SURGERY Left 1987   bx   COLONOSCOPY W/ POLYPECTOMY     ESOPHAGOGASTRODUODENOSCOPY (EGD) WITH PROPOFOL N/A 11/25/2018   Procedure: ESOPHAGOGASTRODUODENOSCOPY  (EGD) WITH PROPOFOL;  Surgeon: Sherrilyn Rist, MD;  Location: [facility];  Service: Gastroenterology;  Laterality: N/A;   EYE SURGERY Bilateral    FEMORAL-POPLITEAL BYPASS GRAFT Bilateral 08/06/2018   Procedure: BILATERAL POPLITEAL AND TIBIAL EMBOLECTOMIES, LEFT LEG FASCIOTOMY;  Surgeon: Larina Earthly, MD;  Location: [facility];  Service: Vascular;  Laterality: Bilateral;   LAPAROSCOPY N/A 07/30/2018   Procedure: DIAGNOSTIC LAPAROSCOPY, Omentopexy gram patch, Washout of intra-abdominal abcesses x 3;  Surgeon: Karie Soda, MD;  Location: [facility];  Service: General;  Laterality: N/A;   OOPHORECTOMY Left 1977   Planters wart Left 2005   radial keratoto Bilateral    TONSILLECTOMY  1958   TOTAL HIP ARTHROPLASTY Left 07/19/2014   Procedure: TOTAL HIP ARTHROPLASTY;  Surgeon: Gean Birchwood, MD;  Location: [facility];  Service: Orthopedics;  Laterality: Left;   TUBAL LIGATION  1986    Family History  Problem Relation Age of Onset   Lung cancer Mother    Stroke Father    Huntington's disease Paternal Grandmother    Huntington's disease Paternal Aunt    Huntington's disease Paternal Uncle    Arthritis Neg Hx        family hx   Cancer Neg Hx        prostate ca -family hx   Heart disease Neg Hx        family hx  Colon cancer Neg Hx    Esophageal cancer Neg Hx    Pancreatic cancer Neg Hx    Liver disease Neg Hx     Social History   Socioeconomic History   Marital status: Married    Spouse name: Not on file   Number of children: Not on file   Years of education: Not on file   Highest education level: Not on file  Occupational History   Occupation: unemployed    Comment: disabled back pain  Tobacco Use   Smoking status: Former    Packs/day: 1.00    Years: 8.00    Pack years: 8.00    Types: Cigarettes    Quit date: 01/17/1994    Years since quitting: 26.7   Smokeless tobacco: Never  Vaping Use   Vaping Use: Never used  Substance and Sexual Activity   Alcohol use: Yes     Alcohol/week: 3.0 standard drinks    Types: 3 Cans of beer per week    Comment: per week   Drug use: No   Sexual activity: Not on file  Other Topics Concern   Not on file  Social History Narrative   Married   Right BKA    Disabled due to back problems       Social Determinants of Health   Financial Resource Strain: Low Risk    Difficulty of Paying Living Expenses: Not hard at all  Food Insecurity: No Food Insecurity   Worried About Programme researcher, broadcasting/film/videounning Out of Food in the Last Year: Never true   Baristaan Out of Food in the Last Year: Never true  Transportation Needs: No Transportation Needs   Lack of Transportation (Medical): No   Lack of Transportation (Non-Medical): No  Physical Activity: Inactive   Days of Exercise per Week: 0 days   Minutes of Exercise per Session: 0 min  Stress: No Stress Concern Present   Feeling of Stress : Not at all  Social Connections: Moderately Integrated   Frequency of Communication with Friends and Family: Twice a week   Frequency of Social Gatherings with Friends and Family: Twice a week   Attends Religious Services: Never   Database administratorActive Member of Clubs or Organizations: Yes   Attends Engineer, structuralClub or Organization Meetings: More than 4 times per year   Marital Status: Married  Catering managerntimate Partner Violence: Not At Risk   Fear of Current or Ex-Partner: No   Emotionally Abused: No   Physically Abused: No   Sexually Abused: No    Outpatient Medications Prior to Visit  Medication Sig Dispense Refill   acetaminophen (TYLENOL) 500 MG tablet Take 1,000 mg by mouth every morning.     oxyCODONE-acetaminophen (PERCOCET/ROXICET) 5-325 MG tablet Take 1 tablet by mouth every 8 (eight) hours as needed for severe pain. 60 tablet 0   risperiDONE (RISPERDAL) 0.5 MG tablet Take 0.5 mg by mouth 2 (two) times daily.     sennosides-docusate sodium (SENOKOT-S) 8.6-50 MG tablet Take 1 tablet by mouth daily.     sertraline (ZOLOFT) 100 MG tablet TAKE 1 TABLET(100 MG) BY MOUTH DAILY 90 tablet 0    No facility-administered medications prior to visit.    Allergies  Allergen Reactions   Bee Venom Anaphylaxis and Swelling   Nsaids Other (See Comments)    PERFORATED ULCER = NO MORE NSAIDs   Adhesive [Tape] Other (See Comments)    redness   Codeine Nausea And Vomiting   Penicillins     Did it involve swelling of the face/tongue/throat,  SOB, or low BP? Yes Did it involve sudden or severe rash/hives, skin peeling, or any reaction on the inside of your mouth or nose? Unknown Did you need to seek medical attention at a hospital or doctor's office? Yes When did it last happen?   age 23-40    If all above answers are "NO", may proceed with cephalosporin use.    Sulfa Antibiotics Itching    ROS Review of Systems  Constitutional:  Negative for chills and fever.     Objective:    Physical Exam Vitals reviewed.  Cardiovascular:     Rate and Rhythm: Normal rate and regular rhythm.  Skin:    Comments: Left great toenail is mostly avulsed at the base.  Along the medial border there is some skin attachment.  There is no erythema.  No purulent drainage.  No cellulitis changes.  She has normal sensory function to touch in both feet with good capillary refill.  Good distal pulses.  Neurological:     Mental Status: She is alert.    BP 124/62 (BP Location: Left Arm, Patient Position: Sitting, Cuff Size: Normal)   Pulse 89   Temp 98.1 F (36.7 C) (Oral)   SpO2 95%  Wt Readings from Last 3 Encounters:  09/14/20 139 lb 1.6 oz (63.1 kg)  09/14/20 139 lb (63 kg)  05/04/20 134 lb (60.8 kg)     Health Maintenance Due  Topic Date Due   COVID-19 Vaccine (1) Never done   MAMMOGRAM  Never done   COLONOSCOPY (Pts 45-17yrs Insurance coverage will need to be confirmed)  01/07/2013   DEXA SCAN  Never done   PNA vac Low Risk Adult (2 of 2 - PPSV23) 08/11/2019    There are no preventive care reminders to display for this patient.  Lab Results  Component Value Date   TSH 2.52  10/09/2017   Lab Results  Component Value Date   WBC 16.2 (H) 08/13/2018   HGB 8.8 (L) 08/13/2018   HCT 27.4 (L) 08/13/2018   MCV 87.3 08/13/2018   PLT 1,200 (HH) 08/13/2018   Lab Results  Component Value Date   NA 136 08/13/2018   K 3.3 (L) 08/13/2018   CO2 23 08/13/2018   GLUCOSE 93 08/13/2018   BUN 5 (L) 08/13/2018   CREATININE 0.52 08/13/2018   BILITOT 0.8 07/30/2018   ALKPHOS 52 07/30/2018   AST 44 (H) 07/30/2018   ALT 23 07/30/2018   PROT 5.8 (L) 07/30/2018   ALBUMIN 3.2 (L) 07/30/2018   CALCIUM 8.1 (L) 08/13/2018   ANIONGAP 10 08/13/2018   GFR 132.90 07/28/2018   Lab Results  Component Value Date   CHOL 200 10/09/2017   Lab Results  Component Value Date   HDL 68.70 10/09/2017   Lab Results  Component Value Date   LDLCALC 108 (H) 10/09/2017   Lab Results  Component Value Date   TRIG 113.0 10/09/2017   Lab Results  Component Value Date   CHOLHDL 3 10/09/2017   No results found for: HGBA1C    Assessment & Plan:   Problem List Items Addressed This Visit   None Visit Diagnoses     Avulsion of toenail, initial encounter    -  Primary     She has partially avulsed left great toenail.  No known trauma.  No signs of secondary infection currently. -We recommended going ahead and removing remainder of nail as this was 90% unattached.  She stills had some attachment along the medial border.  We recommended digital block with 2% plain Xylocaine.  We explained low risk of bleeding and low risk of infection with removal of the remainder of the nail.  Patient consented.  We achieved digital block with 2% plain Xylocaine.  Prepped involved portion of nail with Betadine and using surgical scissors and hemostats gently remove the remainder of the nail without difficulty.  Patient tolerated well with minimal bleeding.  We applied little bit of topical Vaseline and loose dressing applied (secondary to digit nerve block).  Hopefully will grow out new nail over the next  9 to 10 months  -Instructed to clean daily with soap and water starting tomorrow and follow-up immediately for any signs of secondary infection.  No orders of the defined types were placed in this encounter.   Follow-up: No follow-ups on file.    Evelena Peat, MD

## 2020-10-05 DIAGNOSIS — R260 Ataxic gait: Secondary | ICD-10-CM | POA: Diagnosis not present

## 2020-10-05 DIAGNOSIS — Z9181 History of falling: Secondary | ICD-10-CM | POA: Diagnosis not present

## 2020-10-05 DIAGNOSIS — R296 Repeated falls: Secondary | ICD-10-CM | POA: Diagnosis not present

## 2020-10-05 DIAGNOSIS — R262 Difficulty in walking, not elsewhere classified: Secondary | ICD-10-CM | POA: Diagnosis not present

## 2020-10-08 ENCOUNTER — Other Ambulatory Visit: Payer: Self-pay | Admitting: Family Medicine

## 2020-10-11 DIAGNOSIS — R260 Ataxic gait: Secondary | ICD-10-CM | POA: Diagnosis not present

## 2020-10-11 DIAGNOSIS — Z9181 History of falling: Secondary | ICD-10-CM | POA: Diagnosis not present

## 2020-10-11 DIAGNOSIS — R262 Difficulty in walking, not elsewhere classified: Secondary | ICD-10-CM | POA: Diagnosis not present

## 2020-10-11 DIAGNOSIS — R296 Repeated falls: Secondary | ICD-10-CM | POA: Diagnosis not present

## 2020-10-13 DIAGNOSIS — R296 Repeated falls: Secondary | ICD-10-CM | POA: Diagnosis not present

## 2020-10-13 DIAGNOSIS — R260 Ataxic gait: Secondary | ICD-10-CM | POA: Diagnosis not present

## 2020-10-13 DIAGNOSIS — Z9181 History of falling: Secondary | ICD-10-CM | POA: Diagnosis not present

## 2020-10-13 DIAGNOSIS — R262 Difficulty in walking, not elsewhere classified: Secondary | ICD-10-CM | POA: Diagnosis not present

## 2020-10-18 DIAGNOSIS — R260 Ataxic gait: Secondary | ICD-10-CM | POA: Diagnosis not present

## 2020-10-18 DIAGNOSIS — Z9181 History of falling: Secondary | ICD-10-CM | POA: Diagnosis not present

## 2020-10-18 DIAGNOSIS — R262 Difficulty in walking, not elsewhere classified: Secondary | ICD-10-CM | POA: Diagnosis not present

## 2020-10-18 DIAGNOSIS — R296 Repeated falls: Secondary | ICD-10-CM | POA: Diagnosis not present

## 2020-10-20 DIAGNOSIS — R296 Repeated falls: Secondary | ICD-10-CM | POA: Diagnosis not present

## 2020-10-20 DIAGNOSIS — R260 Ataxic gait: Secondary | ICD-10-CM | POA: Diagnosis not present

## 2020-10-20 DIAGNOSIS — Z9181 History of falling: Secondary | ICD-10-CM | POA: Diagnosis not present

## 2020-10-20 DIAGNOSIS — R262 Difficulty in walking, not elsewhere classified: Secondary | ICD-10-CM | POA: Diagnosis not present

## 2020-10-25 DIAGNOSIS — R260 Ataxic gait: Secondary | ICD-10-CM | POA: Diagnosis not present

## 2020-10-25 DIAGNOSIS — Z9181 History of falling: Secondary | ICD-10-CM | POA: Diagnosis not present

## 2020-10-25 DIAGNOSIS — R296 Repeated falls: Secondary | ICD-10-CM | POA: Diagnosis not present

## 2020-10-25 DIAGNOSIS — R262 Difficulty in walking, not elsewhere classified: Secondary | ICD-10-CM | POA: Diagnosis not present

## 2020-10-27 DIAGNOSIS — R260 Ataxic gait: Secondary | ICD-10-CM | POA: Diagnosis not present

## 2020-10-27 DIAGNOSIS — R262 Difficulty in walking, not elsewhere classified: Secondary | ICD-10-CM | POA: Diagnosis not present

## 2020-10-27 DIAGNOSIS — R296 Repeated falls: Secondary | ICD-10-CM | POA: Diagnosis not present

## 2020-10-27 DIAGNOSIS — Z9181 History of falling: Secondary | ICD-10-CM | POA: Diagnosis not present

## 2020-11-01 DIAGNOSIS — Z9181 History of falling: Secondary | ICD-10-CM | POA: Diagnosis not present

## 2020-11-01 DIAGNOSIS — R296 Repeated falls: Secondary | ICD-10-CM | POA: Diagnosis not present

## 2020-11-01 DIAGNOSIS — R262 Difficulty in walking, not elsewhere classified: Secondary | ICD-10-CM | POA: Diagnosis not present

## 2020-11-01 DIAGNOSIS — R260 Ataxic gait: Secondary | ICD-10-CM | POA: Diagnosis not present

## 2020-11-03 DIAGNOSIS — R296 Repeated falls: Secondary | ICD-10-CM | POA: Diagnosis not present

## 2020-11-03 DIAGNOSIS — Z9181 History of falling: Secondary | ICD-10-CM | POA: Diagnosis not present

## 2020-11-03 DIAGNOSIS — R262 Difficulty in walking, not elsewhere classified: Secondary | ICD-10-CM | POA: Diagnosis not present

## 2020-11-03 DIAGNOSIS — R260 Ataxic gait: Secondary | ICD-10-CM | POA: Diagnosis not present

## 2020-11-08 DIAGNOSIS — Z9181 History of falling: Secondary | ICD-10-CM | POA: Diagnosis not present

## 2020-11-08 DIAGNOSIS — R262 Difficulty in walking, not elsewhere classified: Secondary | ICD-10-CM | POA: Diagnosis not present

## 2020-11-08 DIAGNOSIS — R296 Repeated falls: Secondary | ICD-10-CM | POA: Diagnosis not present

## 2020-11-08 DIAGNOSIS — R260 Ataxic gait: Secondary | ICD-10-CM | POA: Diagnosis not present

## 2020-11-10 DIAGNOSIS — R260 Ataxic gait: Secondary | ICD-10-CM | POA: Diagnosis not present

## 2020-11-10 DIAGNOSIS — Z9181 History of falling: Secondary | ICD-10-CM | POA: Diagnosis not present

## 2020-11-10 DIAGNOSIS — R296 Repeated falls: Secondary | ICD-10-CM | POA: Diagnosis not present

## 2020-11-10 DIAGNOSIS — R262 Difficulty in walking, not elsewhere classified: Secondary | ICD-10-CM | POA: Diagnosis not present

## 2020-11-15 DIAGNOSIS — R260 Ataxic gait: Secondary | ICD-10-CM | POA: Diagnosis not present

## 2020-11-15 DIAGNOSIS — R296 Repeated falls: Secondary | ICD-10-CM | POA: Diagnosis not present

## 2020-11-15 DIAGNOSIS — R262 Difficulty in walking, not elsewhere classified: Secondary | ICD-10-CM | POA: Diagnosis not present

## 2020-11-15 DIAGNOSIS — Z9181 History of falling: Secondary | ICD-10-CM | POA: Diagnosis not present

## 2020-11-16 ENCOUNTER — Telehealth: Payer: Self-pay | Admitting: Family Medicine

## 2020-11-16 DIAGNOSIS — G10 Huntington's disease: Secondary | ICD-10-CM

## 2020-11-16 DIAGNOSIS — S88111A Complete traumatic amputation at level between knee and ankle, right lower leg, initial encounter: Secondary | ICD-10-CM

## 2020-11-16 DIAGNOSIS — R209 Unspecified disturbances of skin sensation: Secondary | ICD-10-CM

## 2020-11-16 NOTE — Telephone Encounter (Signed)
Patient spouse Geonna Lockyer called to ask Dr. Caryl Never to put in an order at [facility] for a shoe modification to accommodated brace for the patient. He wants Dr. Caryl Never to attn Reginia Naas when he does.  Spouse stated if we have any questions to call him at [phone].  Please advise.

## 2020-11-18 NOTE — Telephone Encounter (Signed)
Spoke with the patients spouse. He stated that [facility] already has all the information for her since this is where they ordered her last one.

## 2020-11-18 NOTE — Telephone Encounter (Signed)
[facility] but received no answer.

## 2020-11-21 ENCOUNTER — Telehealth: Payer: Self-pay

## 2020-11-21 MED ORDER — OXYCODONE-ACETAMINOPHEN 5-325 MG PO TABS: 1.0000 | ORAL_TABLET | Freq: Three times a day (TID) | ORAL | 0 refills | Status: DC | PRN

## 2020-11-21 NOTE — Telephone Encounter (Signed)
Refill given

## 2020-11-21 NOTE — Telephone Encounter (Deleted)
See result notes. 

## 2020-11-21 NOTE — Telephone Encounter (Signed)
[facility]. No answer.

## 2020-11-22 NOTE — Telephone Encounter (Signed)
[facility]. No answer. 

## 2020-11-23 NOTE — Telephone Encounter (Signed)
Order has been placed and faxed over to [facility].  Left a detailed message on verified voice mail informing the patient.

## 2020-11-23 NOTE — Telephone Encounter (Signed)
Spoke with someone at [facility] and they need a prescription/order for " a shoe modification needed to accommodate a left foot brace".

## 2020-11-23 NOTE — Addendum Note (Signed)
Addended by: Solon Augusta on: 11/23/2020 10:46 AM   Modules accepted: Orders

## 2020-11-24 DIAGNOSIS — Z9181 History of falling: Secondary | ICD-10-CM | POA: Diagnosis not present

## 2020-11-24 DIAGNOSIS — R262 Difficulty in walking, not elsewhere classified: Secondary | ICD-10-CM | POA: Diagnosis not present

## 2020-11-24 DIAGNOSIS — R260 Ataxic gait: Secondary | ICD-10-CM | POA: Diagnosis not present

## 2020-11-24 DIAGNOSIS — R296 Repeated falls: Secondary | ICD-10-CM | POA: Diagnosis not present

## 2020-11-25 ENCOUNTER — Ambulatory Visit (INDEPENDENT_AMBULATORY_CARE_PROVIDER_SITE_OTHER): Payer: Medicare Other | Admitting: Family Medicine

## 2020-11-25 ENCOUNTER — Other Ambulatory Visit: Payer: Self-pay

## 2020-11-25 VITALS — BP 124/60 | HR 98 | Temp 97.7°F

## 2020-11-25 DIAGNOSIS — R21 Rash and other nonspecific skin eruption: Secondary | ICD-10-CM | POA: Diagnosis not present

## 2020-11-25 MED ORDER — CICLOPIROX OLAMINE 0.77 % EX CREA: TOPICAL_CREAM | Freq: Two times a day (BID) | CUTANEOUS | 1 refills | Status: DC

## 2020-11-25 NOTE — Progress Notes (Signed)
Established Patient Office Visit  Subjective:  Patient ID: Natasha Frederick, female    DOB: October 23, 1955  Age: 65 y.o. MRN: [id number]  CC:  Chief Complaint  Patient presents with   Rash    Groin area, x 1 week, inflamed, painful, no itching     HPI Natasha Frederick presents for rash involving her right groin for past week at least.  She states there was little bit of inflammation and discomfort.  No real itching.  She applied a couple of over-the-counter products and basically both products had menthol and zinc oxide.  No recent antibiotics.  No recent prednisone use.  No history of diabetes.  No fever or chills.  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 difficile diarrhea    Endometriosis    Huntington disease (HCC)    Peptic ulcer    Perforated gastric ulcer (HCC)    Scoliosis     Past Surgical History:  Procedure Laterality Date   AMPUTATION Right 08/08/2018   Procedure: AMPUTATION BELOW KNEE RIGHT LOWER EXTREMITY;  Surgeon: Larina Earthly, MD;  Location: [facility];  Service: Vascular;  Laterality: Right;   ANTERIOR LAT LUMBAR FUSION Left 01/05/2014   Procedure: LUMBAR TWO TO THREE, LUMBAR LUMBAR THREE TO FOUR, ANTERIOR LATERAL LUMBAR FUSION 2 LEVELS;  Surgeon: Reinaldo Meeker, MD;  Location: [facility];  Service: Neurosurgery;  Laterality: Left;   APPENDECTOMY  1963   BIOPSY  11/25/2018   Procedure: BIOPSY;  Surgeon: Sherrilyn Rist, MD;  [facility]: [facility];  Service: Gastroenterology;;   BREAST SURGERY Left 1987   bx   COLONOSCOPY W/ POLYPECTOMY     ESOPHAGOGASTRODUODENOSCOPY (EGD) WITH PROPOFOL N/A 11/25/2018   Procedure: ESOPHAGOGASTRODUODENOSCOPY (EGD) WITH PROPOFOL;  Surgeon: Sherrilyn Rist, MD;  [facility];  Service: Gastroenterology;  Laterality: N/A;   EYE SURGERY Bilateral    FEMORAL-POPLITEAL BYPASS GRAFT Bilateral 08/06/2018   Procedure: BILATERAL  POPLITEAL AND TIBIAL EMBOLECTOMIES, LEFT LEG FASCIOTOMY;  Surgeon: Larina Earthly, MD;  [facility];  Service: Vascular;  Laterality: Bilateral;   LAPAROSCOPY N/A 07/30/2018   Procedure: DIAGNOSTIC LAPAROSCOPY, Omentopexy gram patch, Washout of intra-abdominal abcesses x 3;  Surgeon: Karie Soda, MD;  Location: [facility];  Service: General;  Laterality: N/A;   OOPHORECTOMY Left 1977   Planters wart Left 2005   radial keratoto Bilateral    TONSILLECTOMY  1958   TOTAL HIP ARTHROPLASTY Left 07/19/2014   Procedure: TOTAL HIP ARTHROPLASTY;  Surgeon: Gean Birchwood, MD;  Location: [facility];  Service: Orthopedics;  Laterality: Left;   TUBAL LIGATION  1986    Family History  Problem Relation Age of Onset   Lung cancer Mother    Stroke Father    Huntington's disease Paternal Grandmother    Huntington's disease Paternal Aunt    Huntington's disease Paternal Uncle    Arthritis Neg Hx        family hx   Cancer Neg Hx        prostate ca -family hx   Heart disease Neg Hx        family hx   Colon cancer Neg Hx    Esophageal cancer Neg Hx    Pancreatic cancer Neg Hx    Liver disease Neg Hx     Social History   Socioeconomic History   Marital status: Married  Spouse name: Not on file   Number of children: Not on file   Years of education: Not on file   Highest education level: Not on file  Occupational History   Occupation: unemployed    Comment: disabled back pain  Tobacco Use   Smoking status: Former    Packs/day: 1.00    Years: 8.00    Pack years: 8.00    Types: Cigarettes    Quit date: 01/17/1994    Years since quitting: 26.8   Smokeless tobacco: Never  Vaping Use   Vaping Use: Never used  Substance and Sexual Activity   Alcohol use: Yes    Alcohol/week: 3.0 standard drinks    Types: 3 Cans of beer per week    Comment: per week   Drug use: No   Sexual activity: Not on file  Other Topics Concern   Not on file  Social History Narrative   Married   Right BKA    Disabled  due to back problems       Social Determinants of Health   Financial Resource Strain: Low Risk    Difficulty of Paying Living Expenses: Not hard at all  Food Insecurity: No Food Insecurity   Worried About Programme researcher, broadcasting/film/video in the Last Year: Never true   Barista in the Last Year: Never true  Transportation Needs: No Transportation Needs   Lack of Transportation (Medical): No   Lack of Transportation (Non-Medical): No  Physical Activity: Inactive   Days of Exercise per Week: 0 days   Minutes of Exercise per Session: 0 min  Stress: No Stress Concern Present   Feeling of Stress : Not at all  Social Connections: Moderately Integrated   Frequency of Communication with Friends and Family: Twice a week   Frequency of Social Gatherings with Friends and Family: Twice a week   Attends Religious Services: Never   Database administrator or Organizations: Yes   Attends Engineer, structural: More than 4 times per year   Marital Status: Married  Catering manager Violence: Not At Risk   Fear of Current or Ex-Partner: No   Emotionally Abused: No   Physically Abused: No   Sexually Abused: No    Outpatient Medications Prior to Visit  Medication Sig Dispense Refill   acetaminophen (TYLENOL) 500 MG tablet Take 1,000 mg by mouth every morning.     oxyCODONE-acetaminophen (PERCOCET/ROXICET) 5-325 MG tablet Take 1 tablet by mouth every 8 (eight) hours as needed for severe pain. 60 tablet 0   risperiDONE (RISPERDAL) 0.5 MG tablet Take 0.5 mg by mouth 2 (two) times daily.     sennosides-docusate sodium (SENOKOT-S) 8.6-50 MG tablet Take 1 tablet by mouth daily.     sertraline (ZOLOFT) 100 MG tablet TAKE 1 TABLET(100 MG) BY MOUTH DAILY 90 tablet 0   No facility-administered medications prior to visit.    Allergies  Allergen Reactions   Bee Venom Anaphylaxis and Swelling   Nsaids Other (See Comments)    PERFORATED ULCER = NO MORE NSAIDs   Adhesive [Tape] Other (See Comments)     redness   Codeine Nausea And Vomiting   Penicillins     Did it involve swelling of the face/tongue/throat, SOB, or low BP? Yes Did it involve sudden or severe rash/hives, skin peeling, or any reaction on the inside of your mouth or nose? Unknown Did you need to seek medical attention at a hospital or doctor's office? Yes When did it  last happen?   age 39-40    If all above answers are "NO", may proceed with cephalosporin use.    Sulfa Antibiotics Itching    ROS Review of Systems  Constitutional:  Negative for chills and fever.  Skin:  Positive for rash.     Objective:    Physical Exam Vitals reviewed.  Constitutional:      Appearance: Normal appearance.  Cardiovascular:     Rate and Rhythm: Normal rate and regular rhythm.  Skin:    Findings: Rash present.     Comments: She has some mild erythema involving the right groin.  Fairly well demarcated border.  Woods lamp does not show any fluorescence to suggest erythrasma  Neurological:     Mental Status: She is alert.    BP 124/60 (BP Location: Left Arm, Patient Position: Sitting, Cuff Size: Normal)   Pulse 98   Temp 97.7 F (36.5 C) (Oral)   SpO2 100%  Wt Readings from Last 3 Encounters:  09/14/20 139 lb 1.6 oz (63.1 kg)  09/14/20 139 lb (63 kg)  05/04/20 134 lb (60.8 kg)     Health Maintenance Due  Topic Date Due   COVID-19 Vaccine (1) Never done   MAMMOGRAM  Never done   COLONOSCOPY (Pts 45-41yrs Insurance coverage will need to be confirmed)  01/07/2013   DEXA SCAN  Never done   PNA vac Low Risk Adult (2 of 2 - PPSV23) 08/11/2019   INFLUENZA VACCINE  11/07/2020    There are no preventive care reminders to display for this patient.  Lab Results  Component Value Date   TSH 2.52 10/09/2017   Lab Results  Component Value Date   WBC 16.2 (H) 08/13/2018   HGB 8.8 (L) 08/13/2018   HCT 27.4 (L) 08/13/2018   MCV 87.3 08/13/2018   PLT 1,200 (HH) 08/13/2018   Lab Results  Component Value Date   NA 136  08/13/2018   K 3.3 (L) 08/13/2018   CO2 23 08/13/2018   GLUCOSE 93 08/13/2018   BUN 5 (L) 08/13/2018   CREATININE 0.52 08/13/2018   BILITOT 0.8 07/30/2018   ALKPHOS 52 07/30/2018   AST 44 (H) 07/30/2018   ALT 23 07/30/2018   PROT 5.8 (L) 07/30/2018   ALBUMIN 3.2 (L) 07/30/2018   CALCIUM 8.1 (L) 08/13/2018   ANIONGAP 10 08/13/2018   GFR 132.90 07/28/2018   Lab Results  Component Value Date   CHOL 200 10/09/2017   Lab Results  Component Value Date   HDL 68.70 10/09/2017   Lab Results  Component Value Date   LDLCALC 108 (H) 10/09/2017   Lab Results  Component Value Date   TRIG 113.0 10/09/2017   Lab Results  Component Value Date   CHOLHDL 3 10/09/2017   No results found for: HGBA1C    Assessment & Plan:    Right groin rash.  Suspect probably fungal.  No erythrasma by Joseph Art lamp.  -Keep area dry as possible -Prescription for Loprox cream to use twice daily until rash cleared. -Follow-up for any persistent or worsening rash.  Meds ordered this encounter  Medications   ciclopirox (LOPROX) 0.77 % cream    Sig: Apply topically 2 (two) times daily.    Dispense:  15 g    Refill:  1    Follow-up: No follow-ups on file.    Evelena Peat, MD

## 2020-11-25 NOTE — Patient Instructions (Signed)
Keep area as dry as possible  Apply the cream twice daily and be in touch in 2 weeks if no better.

## 2020-11-29 DIAGNOSIS — R260 Ataxic gait: Secondary | ICD-10-CM | POA: Diagnosis not present

## 2020-11-29 DIAGNOSIS — R296 Repeated falls: Secondary | ICD-10-CM | POA: Diagnosis not present

## 2020-11-29 DIAGNOSIS — R262 Difficulty in walking, not elsewhere classified: Secondary | ICD-10-CM | POA: Diagnosis not present

## 2020-11-29 DIAGNOSIS — Z9181 History of falling: Secondary | ICD-10-CM | POA: Diagnosis not present

## 2020-12-01 DIAGNOSIS — Z9181 History of falling: Secondary | ICD-10-CM | POA: Diagnosis not present

## 2020-12-01 DIAGNOSIS — R260 Ataxic gait: Secondary | ICD-10-CM | POA: Diagnosis not present

## 2020-12-01 DIAGNOSIS — R262 Difficulty in walking, not elsewhere classified: Secondary | ICD-10-CM | POA: Diagnosis not present

## 2020-12-01 DIAGNOSIS — R296 Repeated falls: Secondary | ICD-10-CM | POA: Diagnosis not present

## 2020-12-06 ENCOUNTER — Ambulatory Visit (INDEPENDENT_AMBULATORY_CARE_PROVIDER_SITE_OTHER): Payer: Medicare Other | Admitting: Family Medicine

## 2020-12-06 ENCOUNTER — Other Ambulatory Visit: Payer: Self-pay

## 2020-12-06 ENCOUNTER — Encounter: Payer: Self-pay | Admitting: Family Medicine

## 2020-12-06 VITALS — BP 110/64 | HR 57 | Temp 97.9°F

## 2020-12-06 DIAGNOSIS — R296 Repeated falls: Secondary | ICD-10-CM | POA: Diagnosis not present

## 2020-12-06 DIAGNOSIS — M79672 Pain in left foot: Secondary | ICD-10-CM

## 2020-12-06 DIAGNOSIS — L84 Corns and callosities: Secondary | ICD-10-CM

## 2020-12-06 DIAGNOSIS — Z9181 History of falling: Secondary | ICD-10-CM | POA: Diagnosis not present

## 2020-12-06 DIAGNOSIS — R21 Rash and other nonspecific skin eruption: Secondary | ICD-10-CM | POA: Diagnosis not present

## 2020-12-06 DIAGNOSIS — R262 Difficulty in walking, not elsewhere classified: Secondary | ICD-10-CM | POA: Diagnosis not present

## 2020-12-06 DIAGNOSIS — R260 Ataxic gait: Secondary | ICD-10-CM | POA: Diagnosis not present

## 2020-12-06 NOTE — Progress Notes (Signed)
Established Patient Office Visit  Subjective:  Patient ID: Natasha Frederick, female    DOB: 04-16-1955  Age: 65 y.o. MRN: [id number]  CC:  Chief Complaint  Patient presents with   Blister    Sore spot on Left foot, painful when walking,     HPI Natasha Frederick presents for the following items  Recent groin rash has improved greatly and almost resolved with Loprox.  She has had an issue with left foot pain laterally.  She has significant loss of mobility in her left ankle and wears a brace on that.  She has some pain with ambulation.  She is forming a callused area over the lateral foot.  Denies any recent injury.  Other issue is recurrent callus over her left buttock.  We have trimmed this couple times in the past and each time she gets good relief initially but then reforms callus.  She is tried various topicals such as Vaseline without much improvement.  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 difficile diarrhea    Endometriosis    Huntington disease (HCC)    Peptic ulcer    Perforated gastric ulcer (HCC)    Scoliosis     Past Surgical History:  Procedure Laterality Date   AMPUTATION Right 08/08/2018   Procedure: AMPUTATION BELOW KNEE RIGHT LOWER EXTREMITY;  Surgeon: Larina Earthly, MD;  [facility];  Service: Vascular;  Laterality: Right;   ANTERIOR LAT LUMBAR FUSION Left 01/05/2014   Procedure: LUMBAR TWO TO THREE, LUMBAR LUMBAR THREE TO FOUR, ANTERIOR LATERAL LUMBAR FUSION 2 LEVELS;  Surgeon: Reinaldo Meeker, MD;  [facility]: [facility];  Service: Neurosurgery;  Laterality: Left;   APPENDECTOMY  1963   BIOPSY  11/25/2018   Procedure: BIOPSY;  Surgeon: Sherrilyn Rist, MD;  [facility]: [facility];  Service: Gastroenterology;;   BREAST SURGERY Left 1987   bx   COLONOSCOPY W/ POLYPECTOMY     ESOPHAGOGASTRODUODENOSCOPY (EGD) WITH PROPOFOL N/A 11/25/2018   Procedure:  ESOPHAGOGASTRODUODENOSCOPY (EGD) WITH PROPOFOL;  Surgeon: Sherrilyn Rist, MD;  [facility];  Service: Gastroenterology;  Laterality: N/A;   EYE SURGERY Bilateral    FEMORAL-POPLITEAL BYPASS GRAFT Bilateral 08/06/2018   Procedure: BILATERAL POPLITEAL AND TIBIAL EMBOLECTOMIES, LEFT LEG FASCIOTOMY;  Surgeon: Larina Earthly, MD;  [facility];  Service: Vascular;  Laterality: Bilateral;   LAPAROSCOPY N/A 07/30/2018   Procedure: DIAGNOSTIC LAPAROSCOPY, Omentopexy gram patch, Washout of intra-abdominal abcesses x 3;  Surgeon: Karie Soda, MD;  Location: [facility];  Service: General;  Laterality: N/A;   OOPHORECTOMY Left 1977   Planters wart Left 2005   radial keratoto Bilateral    TONSILLECTOMY  1958   TOTAL HIP ARTHROPLASTY Left 07/19/2014   Procedure: TOTAL HIP ARTHROPLASTY;  Surgeon: Gean Birchwood, MD;  [facility];  Service: Orthopedics;  Laterality: Left;   TUBAL LIGATION  1986    Family History  Problem Relation Age of Onset   Lung cancer Mother    Stroke Father    Huntington's disease Paternal Grandmother    Huntington's disease Paternal Aunt    Huntington's disease Paternal Uncle    Arthritis Neg Hx        family hx   Cancer Neg Hx        prostate ca -family hx   Heart disease Neg Hx  family hx   Colon cancer Neg Hx    Esophageal cancer Neg Hx    Pancreatic cancer Neg Hx    Liver disease Neg Hx     Social History   Socioeconomic History   Marital status: Married    Spouse name: Not on file   Number of children: Not on file   Years of education: Not on file   Highest education level: Not on file  Occupational History   Occupation: unemployed    Comment: disabled back pain  Tobacco Use   Smoking status: Former    Packs/day: 1.00    Years: 8.00    Pack years: 8.00    Types: Cigarettes    Quit date: 01/17/1994    Years since quitting: 26.9   Smokeless tobacco: Never  Vaping Use   Vaping Use: Never used  Substance and Sexual Activity    Alcohol use: Yes    Alcohol/week: 3.0 standard drinks    Types: 3 Cans of beer per week    Comment: per week   Drug use: No   Sexual activity: Not on file  Other Topics Concern   Not on file  Social History Narrative   Married   Right BKA    Disabled due to back problems       Social Determinants of Health   Financial Resource Strain: Low Risk    Difficulty of Paying Living Expenses: Not hard at all  Food Insecurity: No Food Insecurity   Worried About Programme researcher, broadcasting/film/video in the Last Year: Never true   Barista in the Last Year: Never true  Transportation Needs: No Transportation Needs   Lack of Transportation (Medical): No   Lack of Transportation (Non-Medical): No  Physical Activity: Inactive   Days of Exercise per Week: 0 days   Minutes of Exercise per Session: 0 min  Stress: No Stress Concern Present   Feeling of Stress : Not at all  Social Connections: Moderately Integrated   Frequency of Communication with Friends and Family: Twice a week   Frequency of Social Gatherings with Friends and Family: Twice a week   Attends Religious Services: Never   Database administrator or Organizations: Yes   Attends Engineer, structural: More than 4 times per year   Marital Status: Married  Catering manager Violence: Not At Risk   Fear of Current or Ex-Partner: No   Emotionally Abused: No   Physically Abused: No   Sexually Abused: No    Outpatient Medications Prior to Visit  Medication Sig Dispense Refill   acetaminophen (TYLENOL) 500 MG tablet Take 1,000 mg by mouth every morning.     ciclopirox (LOPROX) 0.77 % cream Apply topically 2 (two) times daily. 15 g 1   oxyCODONE-acetaminophen (PERCOCET/ROXICET) 5-325 MG tablet Take 1 tablet by mouth every 8 (eight) hours as needed for severe pain. 60 tablet 0   risperiDONE (RISPERDAL) 0.5 MG tablet Take 0.5 mg by mouth 2 (two) times daily.     sennosides-docusate sodium (SENOKOT-S) 8.6-50 MG tablet Take 1 tablet by  mouth daily.     sertraline (ZOLOFT) 100 MG tablet TAKE 1 TABLET(100 MG) BY MOUTH DAILY 90 tablet 0   No facility-administered medications prior to visit.    Allergies  Allergen Reactions   Bee Venom Anaphylaxis and Swelling   Nsaids Other (See Comments)    PERFORATED ULCER = NO MORE NSAIDs   Adhesive [Tape] Other (See Comments)    redness  Codeine Nausea And Vomiting   Penicillins     Did it involve swelling of the face/tongue/throat, SOB, or low BP? Yes Did it involve sudden or severe rash/hives, skin peeling, or any reaction on the inside of your mouth or nose? Unknown Did you need to seek medical attention at a hospital or doctor's office? Yes When did it last happen?   age 29-40    If all above answers are "NO", may proceed with cephalosporin use.    Sulfa Antibiotics Itching    ROS Review of Systems  Constitutional:  Negative for chills and fever.     Objective:    Physical Exam Vitals reviewed.  Constitutional:      Appearance: Normal appearance.  Cardiovascular:     Rate and Rhythm: Normal rate and regular rhythm.  Musculoskeletal:     Comments: Left foot reveals somewhat prominent tubercle lateral aspect of the foot with overlying callus.  No ulceration.  No erythema.  Minimally tender.  Skin:    Comments: She has firm callused area left buttock-with hard corn near the center.  No atypical features.  Neurological:     Mental Status: She is alert.    BP 110/64 (BP Location: Left Arm, Patient Position: Sitting, Cuff Size: Normal)   Pulse (!) 57   Temp 97.9 F (36.6 C) (Oral)   SpO2 94%  Wt Readings from Last 3 Encounters:  09/14/20 139 lb 1.6 oz (63.1 kg)  09/14/20 139 lb (63 kg)  05/04/20 134 lb (60.8 kg)     Health Maintenance Due  Topic Date Due   COVID-19 Vaccine (1) Never done   MAMMOGRAM  Never done   COLONOSCOPY (Pts 45-58yrs Insurance coverage will need to be confirmed)  01/07/2013   DEXA SCAN  Never done   PNA vac Low Risk Adult (2 of 2  - PPSV23) 08/11/2019   INFLUENZA VACCINE  11/07/2020    There are no preventive care reminders to display for this patient.  Lab Results  Component Value Date   TSH 2.52 10/09/2017   Lab Results  Component Value Date   WBC 16.2 (H) 08/13/2018   HGB 8.8 (L) 08/13/2018   HCT 27.4 (L) 08/13/2018   MCV 87.3 08/13/2018   PLT 1,200 (HH) 08/13/2018   Lab Results  Component Value Date   NA 136 08/13/2018   K 3.3 (L) 08/13/2018   CO2 23 08/13/2018   GLUCOSE 93 08/13/2018   BUN 5 (L) 08/13/2018   CREATININE 0.52 08/13/2018   BILITOT 0.8 07/30/2018   ALKPHOS 52 07/30/2018   AST 44 (H) 07/30/2018   ALT 23 07/30/2018   PROT 5.8 (L) 07/30/2018   ALBUMIN 3.2 (L) 07/30/2018   CALCIUM 8.1 (L) 08/13/2018   ANIONGAP 10 08/13/2018   GFR 132.90 07/28/2018   Lab Results  Component Value Date   CHOL 200 10/09/2017   Lab Results  Component Value Date   HDL 68.70 10/09/2017   Lab Results  Component Value Date   LDLCALC 108 (H) 10/09/2017   Lab Results  Component Value Date   TRIG 113.0 10/09/2017   Lab Results  Component Value Date   CHOLHDL 3 10/09/2017   No results found for: HGBA1C    Assessment & Plan:    #1 callus left buttock.  This is painful where she sits much of the time.  She has improved in the past with trimming.  She has a hard core near the center.  She did have some increased sensitivity with  trimming last time without local anesthesia.  We discussed risk and benefits of local anesthesia along with trimming with 15 blade including low risk of infection and bleeding and patient consented.  We used 1% Xylocaine plain and infiltrated the area around the callus.  Using 15 blade pare down the hard callus center.  Underlying tissue appears healthy.  Topical Vaseline and dressing applied  #2 callused area left lateral foot.  No underlying ulcer.  At this point minimally disruptive.  We recommend she try to wear some padding of this area and avoid narrow or tight  fitting shoes.  We offered podiatry referral but at this point she declines  #3 bilateral groin rash improving on Loprox.   Follow-up: No follow-ups on file.    Evelena PeatBruce Augustus Zurawski, MD

## 2020-12-08 DIAGNOSIS — Z9181 History of falling: Secondary | ICD-10-CM | POA: Diagnosis not present

## 2020-12-08 DIAGNOSIS — R260 Ataxic gait: Secondary | ICD-10-CM | POA: Diagnosis not present

## 2020-12-08 DIAGNOSIS — R262 Difficulty in walking, not elsewhere classified: Secondary | ICD-10-CM | POA: Diagnosis not present

## 2020-12-08 DIAGNOSIS — R296 Repeated falls: Secondary | ICD-10-CM | POA: Diagnosis not present

## 2020-12-20 DIAGNOSIS — R260 Ataxic gait: Secondary | ICD-10-CM | POA: Diagnosis not present

## 2020-12-20 DIAGNOSIS — Z9181 History of falling: Secondary | ICD-10-CM | POA: Diagnosis not present

## 2020-12-20 DIAGNOSIS — R262 Difficulty in walking, not elsewhere classified: Secondary | ICD-10-CM | POA: Diagnosis not present

## 2020-12-20 DIAGNOSIS — R296 Repeated falls: Secondary | ICD-10-CM | POA: Diagnosis not present

## 2020-12-22 DIAGNOSIS — R262 Difficulty in walking, not elsewhere classified: Secondary | ICD-10-CM | POA: Diagnosis not present

## 2020-12-22 DIAGNOSIS — R296 Repeated falls: Secondary | ICD-10-CM | POA: Diagnosis not present

## 2020-12-22 DIAGNOSIS — R260 Ataxic gait: Secondary | ICD-10-CM | POA: Diagnosis not present

## 2020-12-22 DIAGNOSIS — Z9181 History of falling: Secondary | ICD-10-CM | POA: Diagnosis not present

## 2020-12-26 ENCOUNTER — Ambulatory Visit: Payer: Medicare Other | Admitting: Family Medicine

## 2021-01-10 DIAGNOSIS — Z9181 History of falling: Secondary | ICD-10-CM | POA: Diagnosis not present

## 2021-01-10 DIAGNOSIS — R296 Repeated falls: Secondary | ICD-10-CM | POA: Diagnosis not present

## 2021-01-10 DIAGNOSIS — R262 Difficulty in walking, not elsewhere classified: Secondary | ICD-10-CM | POA: Diagnosis not present

## 2021-01-10 DIAGNOSIS — R260 Ataxic gait: Secondary | ICD-10-CM | POA: Diagnosis not present

## 2021-01-11 ENCOUNTER — Other Ambulatory Visit: Payer: Self-pay | Admitting: Family Medicine

## 2021-01-16 ENCOUNTER — Telehealth: Payer: Self-pay | Admitting: Family Medicine

## 2021-01-16 DIAGNOSIS — Z1239 Encounter for other screening for malignant neoplasm of breast: Secondary | ICD-10-CM

## 2021-01-16 NOTE — Telephone Encounter (Signed)
Dx of "screening for breast cancer" is not covered. Please advise.

## 2021-01-16 NOTE — Telephone Encounter (Signed)
Please advise. Ok to order a mammogram?

## 2021-01-16 NOTE — Telephone Encounter (Signed)
Patient called saying that she had an appointment with Dr. Lucie Leather nurse and was told that she needs to be scheduled for a mammogram. She says that she has not heard anything back and wanted to check in.  Contact number for the patient is [phone].  Please advise.

## 2021-01-17 DIAGNOSIS — R262 Difficulty in walking, not elsewhere classified: Secondary | ICD-10-CM | POA: Diagnosis not present

## 2021-01-17 DIAGNOSIS — R260 Ataxic gait: Secondary | ICD-10-CM | POA: Diagnosis not present

## 2021-01-17 DIAGNOSIS — Z9181 History of falling: Secondary | ICD-10-CM | POA: Diagnosis not present

## 2021-01-17 DIAGNOSIS — R296 Repeated falls: Secondary | ICD-10-CM | POA: Diagnosis not present

## 2021-01-18 ENCOUNTER — Ambulatory Visit (INDEPENDENT_AMBULATORY_CARE_PROVIDER_SITE_OTHER): Payer: Medicare Other

## 2021-01-18 ENCOUNTER — Other Ambulatory Visit: Payer: Self-pay

## 2021-01-18 DIAGNOSIS — Z23 Encounter for immunization: Secondary | ICD-10-CM | POA: Diagnosis not present

## 2021-01-18 NOTE — Telephone Encounter (Signed)
Noted  

## 2021-01-19 DIAGNOSIS — R262 Difficulty in walking, not elsewhere classified: Secondary | ICD-10-CM | POA: Diagnosis not present

## 2021-01-19 DIAGNOSIS — Z9181 History of falling: Secondary | ICD-10-CM | POA: Diagnosis not present

## 2021-01-19 DIAGNOSIS — R260 Ataxic gait: Secondary | ICD-10-CM | POA: Diagnosis not present

## 2021-01-19 DIAGNOSIS — R296 Repeated falls: Secondary | ICD-10-CM | POA: Diagnosis not present

## 2021-01-20 ENCOUNTER — Telehealth: Payer: Self-pay | Admitting: Family Medicine

## 2021-01-20 MED ORDER — OXYCODONE-ACETAMINOPHEN 5-325 MG PO TABS: 1.0000 | ORAL_TABLET | Freq: Three times a day (TID) | ORAL | 0 refills | Status: DC | PRN

## 2021-01-20 NOTE — Telephone Encounter (Addendum)
Pt husband is calling and requesting a refill on oxyCODONE-acetaminophen (PERCOCET/ROXICET) 5-325 MG tablet oxyCODONE-acetaminophen (PERCOCET/ROXICET) 5-325 MG tablet    for his wife [id number] - [place], [place] - [address] Phone:  [phone]

## 2021-01-20 NOTE — Telephone Encounter (Signed)
Last filled 11/21/2020 Last OV 12/06/2020  Ok to fill?

## 2021-01-24 DIAGNOSIS — R296 Repeated falls: Secondary | ICD-10-CM | POA: Diagnosis not present

## 2021-01-24 DIAGNOSIS — Z9181 History of falling: Secondary | ICD-10-CM | POA: Diagnosis not present

## 2021-01-24 DIAGNOSIS — R260 Ataxic gait: Secondary | ICD-10-CM | POA: Diagnosis not present

## 2021-01-24 DIAGNOSIS — R262 Difficulty in walking, not elsewhere classified: Secondary | ICD-10-CM | POA: Diagnosis not present

## 2021-01-26 DIAGNOSIS — R260 Ataxic gait: Secondary | ICD-10-CM | POA: Diagnosis not present

## 2021-01-26 DIAGNOSIS — R296 Repeated falls: Secondary | ICD-10-CM | POA: Diagnosis not present

## 2021-01-26 DIAGNOSIS — Z9181 History of falling: Secondary | ICD-10-CM | POA: Diagnosis not present

## 2021-01-26 DIAGNOSIS — R262 Difficulty in walking, not elsewhere classified: Secondary | ICD-10-CM | POA: Diagnosis not present

## 2021-01-31 DIAGNOSIS — R262 Difficulty in walking, not elsewhere classified: Secondary | ICD-10-CM | POA: Diagnosis not present

## 2021-01-31 DIAGNOSIS — R296 Repeated falls: Secondary | ICD-10-CM | POA: Diagnosis not present

## 2021-01-31 DIAGNOSIS — Z9181 History of falling: Secondary | ICD-10-CM | POA: Diagnosis not present

## 2021-01-31 DIAGNOSIS — R260 Ataxic gait: Secondary | ICD-10-CM | POA: Diagnosis not present

## 2021-02-02 DIAGNOSIS — R260 Ataxic gait: Secondary | ICD-10-CM | POA: Diagnosis not present

## 2021-02-02 DIAGNOSIS — R262 Difficulty in walking, not elsewhere classified: Secondary | ICD-10-CM | POA: Diagnosis not present

## 2021-02-02 DIAGNOSIS — R296 Repeated falls: Secondary | ICD-10-CM | POA: Diagnosis not present

## 2021-02-02 DIAGNOSIS — Z9181 History of falling: Secondary | ICD-10-CM | POA: Diagnosis not present

## 2021-02-07 DIAGNOSIS — R260 Ataxic gait: Secondary | ICD-10-CM | POA: Diagnosis not present

## 2021-02-07 DIAGNOSIS — R262 Difficulty in walking, not elsewhere classified: Secondary | ICD-10-CM | POA: Diagnosis not present

## 2021-02-07 DIAGNOSIS — Z9181 History of falling: Secondary | ICD-10-CM | POA: Diagnosis not present

## 2021-02-07 DIAGNOSIS — R296 Repeated falls: Secondary | ICD-10-CM | POA: Diagnosis not present

## 2021-02-08 ENCOUNTER — Other Ambulatory Visit: Payer: Self-pay | Admitting: Family Medicine

## 2021-02-08 DIAGNOSIS — Z89511 Acquired absence of right leg below knee: Secondary | ICD-10-CM

## 2021-02-09 DIAGNOSIS — R296 Repeated falls: Secondary | ICD-10-CM | POA: Diagnosis not present

## 2021-02-09 DIAGNOSIS — Z9181 History of falling: Secondary | ICD-10-CM | POA: Diagnosis not present

## 2021-02-09 DIAGNOSIS — R260 Ataxic gait: Secondary | ICD-10-CM | POA: Diagnosis not present

## 2021-02-09 DIAGNOSIS — R262 Difficulty in walking, not elsewhere classified: Secondary | ICD-10-CM | POA: Diagnosis not present

## 2021-02-14 DIAGNOSIS — Z79899 Other long term (current) drug therapy: Secondary | ICD-10-CM | POA: Diagnosis not present

## 2021-02-14 DIAGNOSIS — G10 Huntington's disease: Secondary | ICD-10-CM | POA: Diagnosis not present

## 2021-02-15 DIAGNOSIS — Z1231 Encounter for screening mammogram for malignant neoplasm of breast: Secondary | ICD-10-CM | POA: Diagnosis not present

## 2021-02-16 DIAGNOSIS — R260 Ataxic gait: Secondary | ICD-10-CM | POA: Diagnosis not present

## 2021-02-16 DIAGNOSIS — R262 Difficulty in walking, not elsewhere classified: Secondary | ICD-10-CM | POA: Diagnosis not present

## 2021-02-16 DIAGNOSIS — Z9181 History of falling: Secondary | ICD-10-CM | POA: Diagnosis not present

## 2021-02-16 DIAGNOSIS — R296 Repeated falls: Secondary | ICD-10-CM | POA: Diagnosis not present

## 2021-02-21 DIAGNOSIS — R260 Ataxic gait: Secondary | ICD-10-CM | POA: Diagnosis not present

## 2021-02-21 DIAGNOSIS — Z9181 History of falling: Secondary | ICD-10-CM | POA: Diagnosis not present

## 2021-02-21 DIAGNOSIS — R296 Repeated falls: Secondary | ICD-10-CM | POA: Diagnosis not present

## 2021-02-21 DIAGNOSIS — R262 Difficulty in walking, not elsewhere classified: Secondary | ICD-10-CM | POA: Diagnosis not present

## 2021-02-23 NOTE — Telephone Encounter (Signed)
Please advise. Looks like this encounter was left open with a pended order. Last we spoke you were going to discuss this at her last visit. Is this order still needed?

## 2021-02-24 NOTE — Telephone Encounter (Signed)
Order has been placed.

## 2021-02-28 DIAGNOSIS — Z9181 History of falling: Secondary | ICD-10-CM | POA: Diagnosis not present

## 2021-02-28 DIAGNOSIS — R296 Repeated falls: Secondary | ICD-10-CM | POA: Diagnosis not present

## 2021-02-28 DIAGNOSIS — R262 Difficulty in walking, not elsewhere classified: Secondary | ICD-10-CM | POA: Diagnosis not present

## 2021-02-28 DIAGNOSIS — R260 Ataxic gait: Secondary | ICD-10-CM | POA: Diagnosis not present

## 2021-03-07 DIAGNOSIS — R296 Repeated falls: Secondary | ICD-10-CM | POA: Diagnosis not present

## 2021-03-07 DIAGNOSIS — R260 Ataxic gait: Secondary | ICD-10-CM | POA: Diagnosis not present

## 2021-03-07 DIAGNOSIS — Z9181 History of falling: Secondary | ICD-10-CM | POA: Diagnosis not present

## 2021-03-07 DIAGNOSIS — R262 Difficulty in walking, not elsewhere classified: Secondary | ICD-10-CM | POA: Diagnosis not present

## 2021-03-08 DIAGNOSIS — N6311 Unspecified lump in the right breast, upper outer quadrant: Secondary | ICD-10-CM | POA: Diagnosis not present

## 2021-03-08 DIAGNOSIS — N6342 Unspecified lump in left breast, subareolar: Secondary | ICD-10-CM | POA: Diagnosis not present

## 2021-03-08 DIAGNOSIS — Z1231 Encounter for screening mammogram for malignant neoplasm of breast: Secondary | ICD-10-CM | POA: Diagnosis not present

## 2021-03-08 DIAGNOSIS — R928 Other abnormal and inconclusive findings on diagnostic imaging of breast: Secondary | ICD-10-CM | POA: Diagnosis not present

## 2021-03-09 DIAGNOSIS — R262 Difficulty in walking, not elsewhere classified: Secondary | ICD-10-CM | POA: Diagnosis not present

## 2021-03-09 DIAGNOSIS — Z9181 History of falling: Secondary | ICD-10-CM | POA: Diagnosis not present

## 2021-03-09 DIAGNOSIS — R260 Ataxic gait: Secondary | ICD-10-CM | POA: Diagnosis not present

## 2021-03-09 DIAGNOSIS — R296 Repeated falls: Secondary | ICD-10-CM | POA: Diagnosis not present

## 2021-03-14 ENCOUNTER — Telehealth: Payer: Self-pay

## 2021-03-14 NOTE — Telephone Encounter (Signed)
Husband of patient called asking for results of patient mammogram

## 2021-03-14 NOTE — Telephone Encounter (Signed)
Left a message for the pt to return my call.  

## 2021-03-15 ENCOUNTER — Telehealth: Payer: Self-pay | Admitting: Family Medicine

## 2021-03-15 NOTE — Telephone Encounter (Signed)
Patient calling in with respiratory symptoms: Shortness of breath, chest pain, palpitations or other red words send to Triage  Does the patient have a fever over 100, cough, congestion, sore throat, runny nose, lost of taste/smell within the last 5 days (please list symptoms that patient has)? Watery eyes and cough x 7 days  Have you tested for Covid in the last 5 days? No   If yes, was it positive []  OR negative [] ? If positive in the last 5 days, please schedule virtual visit now. If negative, schedule for an in person OV with the next available provider if PCP has no openings. Please also let patient know they will be tested again (follow the script below)  "you will have to arrive prior to your appt time to be Covid tested. Please park in back of office at the [facility] & call [phone] to let the staff know you have arrived. A staff member will meet you at your car to do a rapid covid test. Once the test has resulted you will be notified by phone of your results to determine if appt will remain an in person visit or be converted to a virtual/phone visit. If you arrive less than before your appt time, your visit will be automatically converted to virtual & any recommended testing will happen AFTER the visit."  Pt does not want to do virtual and would like to see dr [phone] on 03-17-2021 THINGS TO REMEMBER  If no availability for virtual visit in office,  please schedule another [place] office  If no availability at another [place] office, please instruct patient that they can schedule an evisit or virtual visit through their mychart account. Visits up to 8pm  patients can be seen in office 5 days after positive COVID test

## 2021-03-16 DIAGNOSIS — Z9181 History of falling: Secondary | ICD-10-CM | POA: Diagnosis not present

## 2021-03-16 DIAGNOSIS — R296 Repeated falls: Secondary | ICD-10-CM | POA: Diagnosis not present

## 2021-03-16 DIAGNOSIS — R262 Difficulty in walking, not elsewhere classified: Secondary | ICD-10-CM | POA: Diagnosis not present

## 2021-03-16 DIAGNOSIS — R260 Ataxic gait: Secondary | ICD-10-CM | POA: Diagnosis not present

## 2021-03-16 NOTE — Telephone Encounter (Signed)
Left message for patient to call back  

## 2021-03-17 ENCOUNTER — Ambulatory Visit (INDEPENDENT_AMBULATORY_CARE_PROVIDER_SITE_OTHER): Payer: Medicare Other | Admitting: Family Medicine

## 2021-03-17 VITALS — BP 132/64 | HR 95 | Temp 97.7°F

## 2021-03-17 DIAGNOSIS — R059 Cough, unspecified: Secondary | ICD-10-CM

## 2021-03-17 DIAGNOSIS — M1612 Unilateral primary osteoarthritis, left hip: Secondary | ICD-10-CM | POA: Diagnosis not present

## 2021-03-17 DIAGNOSIS — H04203 Unspecified epiphora, bilateral lacrimal glands: Secondary | ICD-10-CM | POA: Diagnosis not present

## 2021-03-17 DIAGNOSIS — M545 Low back pain, unspecified: Secondary | ICD-10-CM | POA: Diagnosis not present

## 2021-03-17 DIAGNOSIS — G8929 Other chronic pain: Secondary | ICD-10-CM

## 2021-03-17 MED ORDER — OXYCODONE-ACETAMINOPHEN 5-325 MG PO TABS: 1.0000 | ORAL_TABLET | Freq: Three times a day (TID) | ORAL | 0 refills | Status: DC | PRN

## 2021-03-17 MED ORDER — AZITHROMYCIN 250 MG PO TABS: ORAL_TABLET | ORAL | 0 refills | Status: AC

## 2021-03-17 NOTE — Progress Notes (Signed)
Established Patient Office Visit  Subjective:  Patient ID: Natasha Frederick, female    DOB: 02-Nov-1955  Age: 65 y.o. MRN: [id number]  CC:  Chief Complaint  Patient presents with   Cough    X 7 days, slight cough, eyes watering    HPI Natasha Frederick presents for cough for the past week or so.  She states her husband had recent cold-like symptoms.  She thinks she caught this from him.  She has had some occasionally productive cough.  No reported fever.  Some mild nasal congestion.  She also complains of more chronic issue of bilateral watery discharge from both eyes.  No purulent secretions.  Occasional itching of the eyes.  Has not taken any recent antihistamine.  She has Huntington's disease.  Followed by [facility] neurology.  She has history of some chronic back pain and primary osteoarthritis of the left hip.  She uses infrequent low-dose oxycodone which seems to help with her pain management.  She is requesting refills.  Only takes about 1 tablet every other day.  Has recently had some more frequent somewhat loose stools.  Has been taking Senokot-S daily.  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 difficile diarrhea    Endometriosis    Huntington disease (HCC)    Peptic ulcer    Perforated gastric ulcer (HCC)    Scoliosis     Past Surgical History:  Procedure Laterality Date   AMPUTATION Right 08/08/2018   Procedure: AMPUTATION BELOW KNEE RIGHT LOWER EXTREMITY;  Surgeon: Larina Earthly, MD;  [facility];  Service: Vascular;  Laterality: Right;   ANTERIOR LAT LUMBAR FUSION Left 01/05/2014   Procedure: LUMBAR TWO TO THREE, LUMBAR LUMBAR THREE TO FOUR, ANTERIOR LATERAL LUMBAR FUSION 2 LEVELS;  Surgeon: Reinaldo Meeker, MD;  [facility];  Service: Neurosurgery;  Laterality: Left;   APPENDECTOMY  1963   BIOPSY  11/25/2018   Procedure: BIOPSY;  Surgeon: Sherrilyn Rist, MD;  [facility];  Service: Gastroenterology;;   BREAST SURGERY Left 1987   bx   COLONOSCOPY W/ POLYPECTOMY     ESOPHAGOGASTRODUODENOSCOPY (EGD) WITH PROPOFOL N/A 11/25/2018   Procedure: ESOPHAGOGASTRODUODENOSCOPY (EGD) WITH PROPOFOL;  Surgeon: Sherrilyn Rist, MD;  [facility];  Service: Gastroenterology;  Laterality: N/A;   EYE SURGERY Bilateral    FEMORAL-POPLITEAL BYPASS GRAFT Bilateral 08/06/2018   Procedure: BILATERAL POPLITEAL AND TIBIAL EMBOLECTOMIES, LEFT LEG FASCIOTOMY;  Surgeon: Larina Earthly, MD;  [facility];  Service: Vascular;  Laterality: Bilateral;   LAPAROSCOPY N/A 07/30/2018   Procedure: DIAGNOSTIC LAPAROSCOPY, Omentopexy gram patch, Washout of intra-abdominal abcesses x 3;  Surgeon: Karie Soda, MD;  [facility];  Service: General;  Laterality: N/A;   OOPHORECTOMY Left 1977   Planters wart Left 2005   radial keratoto Bilateral    TONSILLECTOMY  1958   TOTAL HIP ARTHROPLASTY Left 07/19/2014   Procedure: TOTAL HIP ARTHROPLASTY;  Surgeon: Gean Birchwood, MD;  [facility];  Service: Orthopedics;  Laterality: Left;   TUBAL LIGATION  1986    Family History  Problem Relation Age of Onset   Lung cancer Mother    Stroke Father    Huntington's disease Paternal Grandmother    Huntington's disease Paternal Aunt    Huntington's disease Paternal Uncle    Arthritis Neg Hx  family hx   Cancer Neg Hx        prostate ca -family hx   Heart disease Neg Hx        family hx   Colon cancer Neg Hx    Esophageal cancer Neg Hx    Pancreatic cancer Neg Hx    Liver disease Neg Hx     Social History   Socioeconomic History   Marital status: Married    Spouse name: Not on file   Number of children: Not on file   Years of education: Not on file   Highest education level: Not on file  Occupational History   Occupation: unemployed    Comment: disabled back pain  Tobacco Use   Smoking status: Former    Packs/day: 1.00    Years: 8.00     Pack years: 8.00    Types: Cigarettes    Quit date: 01/17/1994    Years since quitting: 27.1   Smokeless tobacco: Never  Vaping Use   Vaping Use: Never used  Substance and Sexual Activity   Alcohol use: Yes    Alcohol/week: 3.0 standard drinks    Types: 3 Cans of beer per week    Comment: per week   Drug use: No   Sexual activity: Not on file  Other Topics Concern   Not on file  Social History Narrative   Married   Right BKA    Disabled due to back problems       Social Determinants of Health   Financial Resource Strain: Low Risk    Difficulty of Paying Living Expenses: Not hard at all  Food Insecurity: No Food Insecurity   Worried About Programme researcher, broadcasting/film/video in the Last Year: Never true   Barista in the Last Year: Never true  Transportation Needs: No Transportation Needs   Lack of Transportation (Medical): No   Lack of Transportation (Non-Medical): No  Physical Activity: Inactive   Days of Exercise per Week: 0 days   Minutes of Exercise per Session: 0 min  Stress: No Stress Concern Present   Feeling of Stress : Not at all  Social Connections: Moderately Integrated   Frequency of Communication with Friends and Family: Twice a week   Frequency of Social Gatherings with Friends and Family: Twice a week   Attends Religious Services: Never   Database administrator or Organizations: Yes   Attends Engineer, structural: More than 4 times per year   Marital Status: Married  Catering manager Violence: Not At Risk   Fear of Current or Ex-Partner: No   Emotionally Abused: No   Physically Abused: No   Sexually Abused: No    Outpatient Medications Prior to Visit  Medication Sig Dispense Refill   acetaminophen (TYLENOL) 500 MG tablet Take 1,000 mg by mouth every morning.     ciclopirox (LOPROX) 0.77 % cream Apply topically 2 (two) times daily. 15 g 1   risperiDONE (RISPERDAL) 0.5 MG tablet Take 0.5 mg by mouth 2 (two) times daily.     sennosides-docusate  sodium (SENOKOT-S) 8.6-50 MG tablet Take 1 tablet by mouth daily.     sertraline (ZOLOFT) 100 MG tablet TAKE 1 TABLET(100 MG) BY MOUTH DAILY 90 tablet 0   oxyCODONE-acetaminophen (PERCOCET/ROXICET) 5-325 MG tablet Take 1 tablet by mouth every 8 (eight) hours as needed for severe pain. 60 tablet 0   No facility-administered medications prior to visit.    Allergies  Allergen Reactions  Bee Venom Anaphylaxis and Swelling   Nsaids Other (See Comments)    PERFORATED ULCER = NO MORE NSAIDs   Adhesive [Tape] Other (See Comments)    redness   Codeine Nausea And Vomiting   Penicillins     Did it involve swelling of the face/tongue/throat, SOB, or low BP? Yes Did it involve sudden or severe rash/hives, skin peeling, or any reaction on the inside of your mouth or nose? Unknown Did you need to seek medical attention at a hospital or doctor's office? Yes When did it last happen?   age 29-40    If all above answers are "NO", may proceed with cephalosporin use.    Sulfa Antibiotics Itching    ROS Review of Systems  Constitutional:  Negative for chills and fever.  Eyes:  Positive for itching. Negative for pain, redness and visual disturbance.  Respiratory:  Positive for cough. Negative for shortness of breath and wheezing.   Cardiovascular:  Negative for chest pain and palpitations.  Gastrointestinal:  Negative for constipation, nausea and vomiting.     Objective:    Physical Exam Vitals reviewed.  Eyes:     Extraocular Movements: Extraocular movements intact.     Conjunctiva/sclera: Conjunctivae normal.     Comments: She has small amount of watery discharge from both eyes.  No purulent secretions.  Conjunctive are normal.  Cardiovascular:     Rate and Rhythm: Normal rate.  Pulmonary:     Effort: Pulmonary effort is normal.     Breath sounds: Normal breath sounds. No wheezing or rales.  Neurological:     Mental Status: She is alert.    BP 132/64 (BP Location: Left Arm, Patient  Position: Sitting, Cuff Size: Normal)   Pulse 95   Temp 97.7 F (36.5 C) (Oral)   SpO2 97%  Wt Readings from Last 3 Encounters:  09/14/20 139 lb 1.6 oz (63.1 kg)  09/14/20 139 lb (63 kg)  05/04/20 134 lb (60.8 kg)     Health Maintenance Due  Topic Date Due   COVID-19 Vaccine (1) Never done   MAMMOGRAM  Never done   COLONOSCOPY (Pts 45-44yrs Insurance coverage will need to be confirmed)  01/07/2013   DEXA SCAN  Never done   Pneumonia Vaccine 41+ Years old (3 - PPSV23 if available, else PCV20) 08/11/2019    There are no preventive care reminders to display for this patient.  Lab Results  Component Value Date   TSH 2.52 10/09/2017   Lab Results  Component Value Date   WBC 16.2 (H) 08/13/2018   HGB 8.8 (L) 08/13/2018   HCT 27.4 (L) 08/13/2018   MCV 87.3 08/13/2018   PLT 1,200 (HH) 08/13/2018   Lab Results  Component Value Date   NA 136 08/13/2018   K 3.3 (L) 08/13/2018   CO2 23 08/13/2018   GLUCOSE 93 08/13/2018   BUN 5 (L) 08/13/2018   CREATININE 0.52 08/13/2018   BILITOT 0.8 07/30/2018   ALKPHOS 52 07/30/2018   AST 44 (H) 07/30/2018   ALT 23 07/30/2018   PROT 5.8 (L) 07/30/2018   ALBUMIN 3.2 (L) 07/30/2018   CALCIUM 8.1 (L) 08/13/2018   ANIONGAP 10 08/13/2018   GFR 132.90 07/28/2018   Lab Results  Component Value Date   CHOL 200 10/09/2017   Lab Results  Component Value Date   HDL 68.70 10/09/2017   Lab Results  Component Value Date   LDLCALC 108 (H) 10/09/2017   Lab Results  Component Value Date  TRIG 113.0 10/09/2017   Lab Results  Component Value Date   CHOLHDL 3 10/09/2017   No results found for: HGBA1C    Assessment & Plan:   #1 cough.  Suspect recent URI.  Probably started viral.  She has high risk for complications with her Huntington's disease.  We decided to cover with Zithromax.  Plenty fluids.  Consider over-the-counter Mucinex 1200 mg twice daily.  Follow-up promptly for any fever or increased shortness of breath or if cough not  clearing over the next couple weeks  #2 clear watery discharge bilaterally.  She states this preceded her recent cough.  Suspect allergic.  She will try over-the-counter Claritin 10 mg daily  #3 primary osteoarthritis left hip and chronic back pain.  Patient on low-dose oxycodone.  Does not take this daily.  We gave 1 refill.  #4 Huntington's disease.  Followed by [facility] neurology.  Meds ordered this encounter  Medications   oxyCODONE-acetaminophen (PERCOCET/ROXICET) 5-325 MG tablet    Sig: Take 1 tablet by mouth every 8 (eight) hours as needed for severe pain.    Dispense:  60 tablet    Refill:  0   azithromycin (ZITHROMAX Z-PAK) 250 MG tablet    Sig: Take 2 tablets (500 mg) on  Day 1,  followed by 1 tablet (250 mg) once daily on Days 2 through 5.    Dispense:  6 each    Refill:  0    Follow-up: No follow-ups on file.    Carolann Littler, MD

## 2021-03-17 NOTE — Telephone Encounter (Signed)
Left a detailed message on verified voice mail informing the patient that we have not received these results yet and will call once we get them and Dr. Caryl Never has reviewed them.

## 2021-03-17 NOTE — Telephone Encounter (Signed)
Please advise. Have you received these results?

## 2021-03-17 NOTE — Patient Instructions (Signed)
Consider over the counter Mucinex 1,200 mg twice daily  Follow up for any fever or increased shortness of breath.  Consider reducing Senokot to every other day for loose stools.    Consider trial of Claritan (over the counter) for watery eye symptoms

## 2021-03-21 DIAGNOSIS — R260 Ataxic gait: Secondary | ICD-10-CM | POA: Diagnosis not present

## 2021-03-21 DIAGNOSIS — Z9181 History of falling: Secondary | ICD-10-CM | POA: Diagnosis not present

## 2021-03-21 DIAGNOSIS — R262 Difficulty in walking, not elsewhere classified: Secondary | ICD-10-CM | POA: Diagnosis not present

## 2021-03-21 DIAGNOSIS — R296 Repeated falls: Secondary | ICD-10-CM | POA: Diagnosis not present

## 2021-03-22 DIAGNOSIS — N6325 Unspecified lump in the left breast, overlapping quadrants: Secondary | ICD-10-CM | POA: Diagnosis not present

## 2021-03-22 DIAGNOSIS — N6322 Unspecified lump in the left breast, upper inner quadrant: Secondary | ICD-10-CM | POA: Diagnosis not present

## 2021-03-22 DIAGNOSIS — N6311 Unspecified lump in the right breast, upper outer quadrant: Secondary | ICD-10-CM | POA: Diagnosis not present

## 2021-03-22 DIAGNOSIS — N61 Mastitis without abscess: Secondary | ICD-10-CM | POA: Diagnosis not present

## 2021-03-23 DIAGNOSIS — R296 Repeated falls: Secondary | ICD-10-CM | POA: Diagnosis not present

## 2021-03-23 DIAGNOSIS — R260 Ataxic gait: Secondary | ICD-10-CM | POA: Diagnosis not present

## 2021-03-23 DIAGNOSIS — Z9181 History of falling: Secondary | ICD-10-CM | POA: Diagnosis not present

## 2021-03-23 DIAGNOSIS — R262 Difficulty in walking, not elsewhere classified: Secondary | ICD-10-CM | POA: Diagnosis not present

## 2021-03-27 ENCOUNTER — Other Ambulatory Visit: Payer: Self-pay | Admitting: Family Medicine

## 2021-03-27 NOTE — Telephone Encounter (Signed)
Please advise. Rx is not on the current med list 

## 2021-03-28 ENCOUNTER — Telehealth: Payer: Self-pay | Admitting: Family Medicine

## 2021-03-28 DIAGNOSIS — R262 Difficulty in walking, not elsewhere classified: Secondary | ICD-10-CM | POA: Diagnosis not present

## 2021-03-28 DIAGNOSIS — R296 Repeated falls: Secondary | ICD-10-CM | POA: Diagnosis not present

## 2021-03-28 DIAGNOSIS — R260 Ataxic gait: Secondary | ICD-10-CM | POA: Diagnosis not present

## 2021-03-28 DIAGNOSIS — Z9181 History of falling: Secondary | ICD-10-CM | POA: Diagnosis not present

## 2021-03-28 NOTE — Telephone Encounter (Signed)
Left message for patient to call back  

## 2021-03-28 NOTE — Telephone Encounter (Signed)
Pt husband is calling to report the pt is still having watery eyes and would like to know the next step .PT was last seen on 03-17-2021

## 2021-03-29 NOTE — Telephone Encounter (Signed)
Left message for patient to call back  

## 2021-03-30 DIAGNOSIS — Z9181 History of falling: Secondary | ICD-10-CM | POA: Diagnosis not present

## 2021-03-30 DIAGNOSIS — R262 Difficulty in walking, not elsewhere classified: Secondary | ICD-10-CM | POA: Diagnosis not present

## 2021-03-30 DIAGNOSIS — R296 Repeated falls: Secondary | ICD-10-CM | POA: Diagnosis not present

## 2021-03-30 DIAGNOSIS — R260 Ataxic gait: Secondary | ICD-10-CM | POA: Diagnosis not present

## 2021-03-31 NOTE — Telephone Encounter (Signed)
Left message for patient to call back  

## 2021-04-04 DIAGNOSIS — R260 Ataxic gait: Secondary | ICD-10-CM | POA: Diagnosis not present

## 2021-04-04 DIAGNOSIS — R296 Repeated falls: Secondary | ICD-10-CM | POA: Diagnosis not present

## 2021-04-04 DIAGNOSIS — R262 Difficulty in walking, not elsewhere classified: Secondary | ICD-10-CM | POA: Diagnosis not present

## 2021-04-04 DIAGNOSIS — Z9181 History of falling: Secondary | ICD-10-CM | POA: Diagnosis not present

## 2021-04-04 NOTE — Telephone Encounter (Signed)
ATC, voicemail is full. Unable to reach the patient. Message will be closed.  °

## 2021-04-11 DIAGNOSIS — Z9181 History of falling: Secondary | ICD-10-CM | POA: Diagnosis not present

## 2021-04-11 DIAGNOSIS — R260 Ataxic gait: Secondary | ICD-10-CM | POA: Diagnosis not present

## 2021-04-11 DIAGNOSIS — R296 Repeated falls: Secondary | ICD-10-CM | POA: Diagnosis not present

## 2021-04-11 DIAGNOSIS — R262 Difficulty in walking, not elsewhere classified: Secondary | ICD-10-CM | POA: Diagnosis not present

## 2021-04-13 ENCOUNTER — Other Ambulatory Visit: Payer: Self-pay | Admitting: Family Medicine

## 2021-04-13 DIAGNOSIS — Z9181 History of falling: Secondary | ICD-10-CM | POA: Diagnosis not present

## 2021-04-13 DIAGNOSIS — R262 Difficulty in walking, not elsewhere classified: Secondary | ICD-10-CM | POA: Diagnosis not present

## 2021-04-13 DIAGNOSIS — R260 Ataxic gait: Secondary | ICD-10-CM | POA: Diagnosis not present

## 2021-04-13 DIAGNOSIS — R296 Repeated falls: Secondary | ICD-10-CM | POA: Diagnosis not present

## 2021-04-17 ENCOUNTER — Ambulatory Visit (INDEPENDENT_AMBULATORY_CARE_PROVIDER_SITE_OTHER): Payer: Medicare Other | Admitting: Family Medicine

## 2021-04-17 VITALS — BP 132/64 | HR 80 | Temp 98.2°F

## 2021-04-17 DIAGNOSIS — F339 Major depressive disorder, recurrent, unspecified: Secondary | ICD-10-CM

## 2021-04-17 DIAGNOSIS — G10 Huntington's disease: Secondary | ICD-10-CM

## 2021-04-17 DIAGNOSIS — Z89511 Acquired absence of right leg below knee: Secondary | ICD-10-CM | POA: Diagnosis not present

## 2021-04-17 DIAGNOSIS — S88111A Complete traumatic amputation at level between knee and ankle, right lower leg, initial encounter: Secondary | ICD-10-CM

## 2021-04-17 DIAGNOSIS — R197 Diarrhea, unspecified: Secondary | ICD-10-CM

## 2021-04-17 NOTE — Progress Notes (Signed)
Established Patient Office Visit  Subjective:  Patient ID: Natasha BeckmannJudith Frederick, female    DOB: 12/02/1955  Age: 66 y.o. MRN: [id number]  CC:  Chief Complaint  Patient presents with   Follow-up    HPI Natasha BeckmannJudith Frederick presents for face-to-face encounter to document needs for ongoing supplies for right below-knee amputation prosthesis and also for revisions to left foot brace.  She has Huntington's disease and ambulates with a walker.  She is currently [facility].  She has had, as expected, progression in her neurologic deficits but coping fairly well.  Her husband is a great aid.  She has a wheelchair but tries to ambulate some with her walker at home.  She has history of left foot drop for couple years and has current AFO brace which they think needs revisions.  Her heel frequently comes out of her shoe with her current brace.  They are considering hightops.  Husband is needing prescription to get revisions to her left lower extremity brace.  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  This was back in 2020.  She would like to consult with experts at [facility] regarding any needed supplies for her right below-knee amputation prosthesis  She been taking Senokot-S for constipation and was having loose stools and we he advised her to scale back to every other day.  She still has occasional loose stools.  No bloody stools.  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 difficile diarrhea    Endometriosis    Huntington disease (HCC)    Peptic ulcer    Perforated gastric ulcer (HCC)    Scoliosis     Past Surgical History:  Procedure Laterality Date   AMPUTATION Right 08/08/2018   Procedure: AMPUTATION BELOW KNEE RIGHT LOWER EXTREMITY;  Surgeon: Larina EarthlyEarly,  Todd F, MD;  Location: [facility] OR;  Service: Vascular;  Laterality: Right;   ANTERIOR LAT LUMBAR FUSION Left 01/05/2014   Procedure: LUMBAR TWO TO THREE, LUMBAR LUMBAR THREE TO FOUR, ANTERIOR LATERAL LUMBAR FUSION 2 LEVELS;  Surgeon: Reinaldo Meekerandy O Kritzer, MD;  Location: [facility] NEURO ORS;  Service: Neurosurgery;  Laterality: Left;   APPENDECTOMY  1963   BIOPSY  11/25/2018   Procedure: BIOPSY;  Surgeon: Sherrilyn Ristanis, Henry L III, MD;  Location: [facility] ENDOSCOPY;  Service: Gastroenterology;;   BREAST SURGERY Left 1987   bx   COLONOSCOPY W/ POLYPECTOMY     ESOPHAGOGASTRODUODENOSCOPY (EGD) WITH PROPOFOL N/A 11/25/2018   Procedure: ESOPHAGOGASTRODUODENOSCOPY (EGD) WITH PROPOFOL;  Surgeon: Sherrilyn Ristanis, Henry L III, MD;  Location: [facility];  Service: Gastroenterology;  Laterality: N/A;   EYE SURGERY Bilateral    FEMORAL-POPLITEAL BYPASS GRAFT Bilateral 08/06/2018   Procedure: BILATERAL POPLITEAL AND TIBIAL EMBOLECTOMIES, LEFT LEG FASCIOTOMY;  Surgeon: Larina EarthlyEarly, Todd F, MD;  Location: [facility] OR;  Service: Vascular;  Laterality: Bilateral;   LAPAROSCOPY N/A 07/30/2018   Procedure: DIAGNOSTIC LAPAROSCOPY, Omentopexy gram patch, Washout of intra-abdominal abcesses x 3;  Surgeon: Karie SodaGross, Steven, MD;  Location: [facility] ORS;  Service: General;  Laterality: N/A;   OOPHORECTOMY Left 1977   Planters wart Left 2005   radial keratoto Bilateral    TONSILLECTOMY  1958   TOTAL HIP ARTHROPLASTY Left 07/19/2014   Procedure: TOTAL HIP ARTHROPLASTY;  Surgeon: Gean BirchwoodFrank Rowan, MD;  Location: [facility] OR;  Service: Orthopedics;  Laterality: Left;   TUBAL LIGATION  1986    Family History  Problem Relation Age of Onset   Lung cancer Mother    Stroke Father    Huntington's disease Paternal Grandmother    Huntington's disease Paternal Aunt    Huntington's disease Paternal Uncle    Arthritis Neg Hx        family hx   Cancer Neg Hx        prostate ca -family hx   Heart disease Neg Hx        family hx   Colon cancer Neg Hx    Esophageal cancer Neg Hx    Pancreatic  cancer Neg Hx    Liver disease Neg Hx     Social History   Socioeconomic History   Marital status: Married    Spouse name: Not on file   Number of children: Not on file   Years of education: Not on file   Highest education level: Not on file  Occupational History   Occupation: unemployed    Comment: disabled back pain  Tobacco Use   Smoking status: Former    Packs/day: 1.00    Years: 8.00    Pack years: 8.00    Types: Cigarettes    Quit date: 01/17/1994    Years since quitting: 27.2   Smokeless tobacco: Never  Vaping Use   Vaping Use: Never used  Substance and Sexual Activity   Alcohol use: Yes    Alcohol/week: 3.0 standard drinks    Types: 3 Cans of beer per week    Comment: per week   Drug use: No   Sexual activity: Not on file  Other Topics Concern   Not on file  Social History Narrative   Married   Right BKA    Disabled due to back problems       Social Determinants of Health   Financial Resource Strain: Low Risk    Difficulty of Paying Living Expenses: Not hard at all  Food Insecurity: No Food Insecurity   Worried About Programme researcher, broadcasting/film/video in the Last Year: Never true   Barista in the Last Year: Never true  Transportation Needs: No Transportation Needs   Lack of Transportation (Medical): No   Lack of Transportation (Non-Medical): No  Physical Activity: Inactive   Days of Exercise per Week: 0 days   Minutes of Exercise per Session: 0 min  Stress: No Stress Concern Present   Feeling of Stress : Not at all  Social Connections: Moderately Integrated   Frequency of Communication with Friends and Family: Twice a week   Frequency of Social Gatherings with Friends and Family: Twice a week   Attends Religious Services: Never   Database administrator or Organizations: Yes   Attends Engineer, structural: More than 4 times per year   Marital Status: Married  Catering manager Violence: Not At Risk   Fear of Current or Ex-Partner: No    Emotionally Abused: No   Physically Abused: No   Sexually Abused: No    Outpatient Medications Prior to Visit  Medication Sig Dispense Refill   acetaminophen (TYLENOL) 500 MG tablet Take 1,000 mg by mouth every morning.     ciclopirox (LOPROX) 0.77 % cream Apply topically 2 (two) times daily. 15 g 1   oxyCODONE-acetaminophen (PERCOCET/ROXICET) 5-325 MG tablet Take 1 tablet by mouth every 8 (eight) hours as needed for severe pain. 60  tablet 0   risperiDONE (RISPERDAL) 0.5 MG tablet Take 0.5 mg by mouth 2 (two) times daily.     sennosides-docusate sodium (SENOKOT-S) 8.6-50 MG tablet Take 1 tablet by mouth daily.     sertraline (ZOLOFT) 100 MG tablet TAKE 1 TABLET(100 MG) BY MOUTH DAILY 90 tablet 0   No facility-administered medications prior to visit.    Allergies  Allergen Reactions   Bee Venom Anaphylaxis and Swelling   Nsaids Other (See Comments)    PERFORATED ULCER = NO MORE NSAIDs   Adhesive [Tape] Other (See Comments)    redness   Codeine Nausea And Vomiting   Penicillins     Did it involve swelling of the face/tongue/throat, SOB, or low BP? Yes Did it involve sudden or severe rash/hives, skin peeling, or any reaction on the inside of your mouth or nose? Unknown Did you need to seek medical attention at a hospital or doctor's office? Yes When did it last happen?   age 66-40    If all above answers are NO, may proceed with cephalosporin use.    Sulfa Antibiotics Itching    ROS Review of Systems  Constitutional:  Negative for chills and fever.  Respiratory:  Negative for cough and shortness of breath.   Cardiovascular:  Negative for chest pain.  Gastrointestinal:  Negative for abdominal pain.  Neurological:  Negative for dizziness.     Objective:    Physical Exam Vitals reviewed.  Cardiovascular:     Rate and Rhythm: Normal rate and regular rhythm.  Pulmonary:     Effort: Pulmonary effort is normal.     Breath sounds: Normal breath sounds. No wheezing or rales.   Musculoskeletal:     Comments: She has a brace on her left lower extremity.  No leg edema.  She has right below-knee amputation.  Neurological:     Mental Status: She is alert.    BP 132/64 (BP Location: Left Arm, Patient Position: Sitting, Cuff Size: Normal)    Pulse 80    Temp 98.2 F (36.8 C) (Oral)    SpO2 97%  Wt Readings from Last 3 Encounters:  09/14/20 139 lb 1.6 oz (63.1 kg)  09/14/20 139 lb (63 kg)  05/04/20 134 lb (60.8 kg)     Health Maintenance Due  Topic Date Due   COVID-19 Vaccine (1) Never done   MAMMOGRAM  Never done   COLONOSCOPY (Pts 45-4255yrs Insurance coverage will need to be confirmed)  01/07/2013   DEXA SCAN  Never done   Pneumonia Vaccine 1765+ Years old (3 - PPSV23 if available, else PCV20) 08/11/2019    There are no preventive care reminders to display for this patient.  Lab Results  Component Value Date   TSH 2.52 10/09/2017   Lab Results  Component Value Date   WBC 16.2 (H) 08/13/2018   HGB 8.8 (L) 08/13/2018   HCT 27.4 (L) 08/13/2018   MCV 87.3 08/13/2018   PLT 1,200 (HH) 08/13/2018   Lab Results  Component Value Date   NA 136 08/13/2018   K 3.3 (L) 08/13/2018   CO2 23 08/13/2018   GLUCOSE 93 08/13/2018   BUN 5 (L) 08/13/2018   CREATININE 0.52 08/13/2018   BILITOT 0.8 07/30/2018   ALKPHOS 52 07/30/2018   AST 44 (H) 07/30/2018   ALT 23 07/30/2018   PROT 5.8 (L) 07/30/2018   ALBUMIN 3.2 (L) 07/30/2018   CALCIUM 8.1 (L) 08/13/2018   ANIONGAP 10 08/13/2018   GFR 132.90 07/28/2018   Lab  Results  Component Value Date   CHOL 200 10/09/2017   Lab Results  Component Value Date   HDL 68.70 10/09/2017   Lab Results  Component Value Date   LDLCALC 108 (H) 10/09/2017   Lab Results  Component Value Date   TRIG 113.0 10/09/2017   Lab Results  Component Value Date   CHOLHDL 3 10/09/2017   No results found for: HGBA1C    Assessment & Plan:   #1 Huntington's disease with progressive neurologic dysfunction.  Continue close  follow-up at [facility] neurology.  She is currently on risperidone.  She seems to be coping fairly well emotionally at this time.  She does have history of recurrent depression and is on sertraline 100 mg daily  #2 history of left foot drop.  She has AFO brace on the left that needs revisions.  We recommend that she see experts at [facility] for recommendations  #3 frequent loose stools.  Recommend she leave off Senokot-S at this time.  She does take 1 oxycodone at night because of osteoarthritis pains but will watch closely for any constipation symptoms off the Senokot   No orders of the defined types were placed in this encounter.   Follow-up: No follow-ups on file.    Evelena Peat, MD

## 2021-04-18 DIAGNOSIS — R262 Difficulty in walking, not elsewhere classified: Secondary | ICD-10-CM | POA: Diagnosis not present

## 2021-04-18 DIAGNOSIS — R296 Repeated falls: Secondary | ICD-10-CM | POA: Diagnosis not present

## 2021-04-18 DIAGNOSIS — R260 Ataxic gait: Secondary | ICD-10-CM | POA: Diagnosis not present

## 2021-04-18 DIAGNOSIS — Z9181 History of falling: Secondary | ICD-10-CM | POA: Diagnosis not present

## 2021-04-24 ENCOUNTER — Telehealth: Payer: Self-pay | Admitting: Family Medicine

## 2021-04-24 DIAGNOSIS — R262 Difficulty in walking, not elsewhere classified: Secondary | ICD-10-CM | POA: Diagnosis not present

## 2021-04-24 DIAGNOSIS — H9190 Unspecified hearing loss, unspecified ear: Secondary | ICD-10-CM

## 2021-04-24 DIAGNOSIS — Z9181 History of falling: Secondary | ICD-10-CM | POA: Diagnosis not present

## 2021-04-24 DIAGNOSIS — R296 Repeated falls: Secondary | ICD-10-CM | POA: Diagnosis not present

## 2021-04-24 DIAGNOSIS — R260 Ataxic gait: Secondary | ICD-10-CM | POA: Diagnosis not present

## 2021-04-24 MED ORDER — CICLOPIROX OLAMINE 0.77 % EX CREA: TOPICAL_CREAM | Freq: Two times a day (BID) | CUTANEOUS | 1 refills | Status: DC

## 2021-04-24 NOTE — Telephone Encounter (Signed)
Pt call and stated she need a referral to a ear ,nose and throat doctor.

## 2021-04-24 NOTE — Telephone Encounter (Signed)
Rx has been sent in.  Spoke with the patients husband and he is aware.

## 2021-04-24 NOTE — Telephone Encounter (Signed)
Pt husband call and stated pt need a refill on ciclopirox (LOPROX) 0.77 % cream  sent to  [id number] - [place], [place] - [address] Phone:  [phone]  Fax:  [phone]

## 2021-04-25 NOTE — Telephone Encounter (Signed)
Spoke with patients husband and he stated that the patient recently had a hearing test done and has significant hearing loss in one ear. Referral placed.

## 2021-05-02 DIAGNOSIS — R296 Repeated falls: Secondary | ICD-10-CM | POA: Diagnosis not present

## 2021-05-02 DIAGNOSIS — R262 Difficulty in walking, not elsewhere classified: Secondary | ICD-10-CM | POA: Diagnosis not present

## 2021-05-02 DIAGNOSIS — Z9181 History of falling: Secondary | ICD-10-CM | POA: Diagnosis not present

## 2021-05-02 DIAGNOSIS — R260 Ataxic gait: Secondary | ICD-10-CM | POA: Diagnosis not present

## 2021-05-04 DIAGNOSIS — Z9181 History of falling: Secondary | ICD-10-CM | POA: Diagnosis not present

## 2021-05-04 DIAGNOSIS — R262 Difficulty in walking, not elsewhere classified: Secondary | ICD-10-CM | POA: Diagnosis not present

## 2021-05-04 DIAGNOSIS — R260 Ataxic gait: Secondary | ICD-10-CM | POA: Diagnosis not present

## 2021-05-04 DIAGNOSIS — R296 Repeated falls: Secondary | ICD-10-CM | POA: Diagnosis not present

## 2021-05-08 ENCOUNTER — Telehealth: Payer: Self-pay | Admitting: Family Medicine

## 2021-05-08 ENCOUNTER — Other Ambulatory Visit: Payer: Self-pay | Admitting: Family Medicine

## 2021-05-08 MED ORDER — OXYCODONE-ACETAMINOPHEN 5-325 MG PO TABS: 1.0000 | ORAL_TABLET | Freq: Three times a day (TID) | ORAL | 0 refills | Status: DC | PRN

## 2021-05-08 NOTE — Telephone Encounter (Signed)
Last filled 03/17/2021 Last OV 04/17/2021  Ok to fill?

## 2021-05-08 NOTE — Telephone Encounter (Signed)
Left a detailed message on verified voice mail informing the patient that her refill was sent in.

## 2021-05-08 NOTE — Telephone Encounter (Signed)
Patient spouse called in stating his wife was out of her Oxycodone 325 mg.  Would like for her refill to be called into pharmacy

## 2021-05-08 NOTE — Telephone Encounter (Signed)
Refill sent.

## 2021-05-09 DIAGNOSIS — R262 Difficulty in walking, not elsewhere classified: Secondary | ICD-10-CM | POA: Diagnosis not present

## 2021-05-09 DIAGNOSIS — Z9181 History of falling: Secondary | ICD-10-CM | POA: Diagnosis not present

## 2021-05-09 DIAGNOSIS — R260 Ataxic gait: Secondary | ICD-10-CM | POA: Diagnosis not present

## 2021-05-09 DIAGNOSIS — R296 Repeated falls: Secondary | ICD-10-CM | POA: Diagnosis not present

## 2021-05-10 ENCOUNTER — Telehealth: Payer: Self-pay

## 2021-05-10 NOTE — Telephone Encounter (Signed)
Spoke with the patient's husband. He stated she is out of the cream for her rash and needs another tube. I explained that there is a refill on file for her but insurance will not pay for this yet since the last tube should have lasted them 1 month. He would like to know if Dr. Caryl Never has any recommendations since the rash keeps coming back.

## 2021-05-11 DIAGNOSIS — R296 Repeated falls: Secondary | ICD-10-CM | POA: Diagnosis not present

## 2021-05-11 DIAGNOSIS — R262 Difficulty in walking, not elsewhere classified: Secondary | ICD-10-CM | POA: Diagnosis not present

## 2021-05-11 DIAGNOSIS — Z9181 History of falling: Secondary | ICD-10-CM | POA: Diagnosis not present

## 2021-05-11 DIAGNOSIS — R260 Ataxic gait: Secondary | ICD-10-CM | POA: Diagnosis not present

## 2021-05-11 NOTE — Telephone Encounter (Signed)
Patient spouse is calling back to check up on prior note. He would like to be contacted at [phone].  Please advise.

## 2021-05-12 NOTE — Telephone Encounter (Signed)
Left message for patient to call back  

## 2021-05-12 NOTE — Telephone Encounter (Signed)
Spoke with the patient's husband and discussed Dr. Anastasio Auerbach message in detail. He expressed understanding. Nothing further needed at this time.

## 2021-05-16 DIAGNOSIS — R296 Repeated falls: Secondary | ICD-10-CM | POA: Diagnosis not present

## 2021-05-16 DIAGNOSIS — R262 Difficulty in walking, not elsewhere classified: Secondary | ICD-10-CM | POA: Diagnosis not present

## 2021-05-16 DIAGNOSIS — Z9181 History of falling: Secondary | ICD-10-CM | POA: Diagnosis not present

## 2021-05-16 DIAGNOSIS — R260 Ataxic gait: Secondary | ICD-10-CM | POA: Diagnosis not present

## 2021-05-23 DIAGNOSIS — Z9181 History of falling: Secondary | ICD-10-CM | POA: Diagnosis not present

## 2021-05-23 DIAGNOSIS — R296 Repeated falls: Secondary | ICD-10-CM | POA: Diagnosis not present

## 2021-05-23 DIAGNOSIS — R262 Difficulty in walking, not elsewhere classified: Secondary | ICD-10-CM | POA: Diagnosis not present

## 2021-05-23 DIAGNOSIS — R260 Ataxic gait: Secondary | ICD-10-CM | POA: Diagnosis not present

## 2021-05-25 DIAGNOSIS — Z9181 History of falling: Secondary | ICD-10-CM | POA: Diagnosis not present

## 2021-05-25 DIAGNOSIS — R260 Ataxic gait: Secondary | ICD-10-CM | POA: Diagnosis not present

## 2021-05-25 DIAGNOSIS — R262 Difficulty in walking, not elsewhere classified: Secondary | ICD-10-CM | POA: Diagnosis not present

## 2021-05-25 DIAGNOSIS — R296 Repeated falls: Secondary | ICD-10-CM | POA: Diagnosis not present

## 2021-05-30 DIAGNOSIS — R296 Repeated falls: Secondary | ICD-10-CM | POA: Diagnosis not present

## 2021-05-30 DIAGNOSIS — R262 Difficulty in walking, not elsewhere classified: Secondary | ICD-10-CM | POA: Diagnosis not present

## 2021-05-30 DIAGNOSIS — Z9181 History of falling: Secondary | ICD-10-CM | POA: Diagnosis not present

## 2021-05-30 DIAGNOSIS — R260 Ataxic gait: Secondary | ICD-10-CM | POA: Diagnosis not present

## 2021-06-01 DIAGNOSIS — R260 Ataxic gait: Secondary | ICD-10-CM | POA: Diagnosis not present

## 2021-06-01 DIAGNOSIS — R262 Difficulty in walking, not elsewhere classified: Secondary | ICD-10-CM | POA: Diagnosis not present

## 2021-06-01 DIAGNOSIS — R296 Repeated falls: Secondary | ICD-10-CM | POA: Diagnosis not present

## 2021-06-01 DIAGNOSIS — Z9181 History of falling: Secondary | ICD-10-CM | POA: Diagnosis not present

## 2021-06-06 DIAGNOSIS — R296 Repeated falls: Secondary | ICD-10-CM | POA: Diagnosis not present

## 2021-06-06 DIAGNOSIS — R262 Difficulty in walking, not elsewhere classified: Secondary | ICD-10-CM | POA: Diagnosis not present

## 2021-06-06 DIAGNOSIS — Z9181 History of falling: Secondary | ICD-10-CM | POA: Diagnosis not present

## 2021-06-06 DIAGNOSIS — R260 Ataxic gait: Secondary | ICD-10-CM | POA: Diagnosis not present

## 2021-06-08 DIAGNOSIS — R262 Difficulty in walking, not elsewhere classified: Secondary | ICD-10-CM | POA: Diagnosis not present

## 2021-06-08 DIAGNOSIS — R260 Ataxic gait: Secondary | ICD-10-CM | POA: Diagnosis not present

## 2021-06-08 DIAGNOSIS — Z9181 History of falling: Secondary | ICD-10-CM | POA: Diagnosis not present

## 2021-06-08 DIAGNOSIS — R296 Repeated falls: Secondary | ICD-10-CM | POA: Diagnosis not present

## 2021-06-12 DIAGNOSIS — H6123 Impacted cerumen, bilateral: Secondary | ICD-10-CM | POA: Diagnosis not present

## 2021-06-14 ENCOUNTER — Encounter: Payer: Self-pay | Admitting: Family Medicine

## 2021-06-14 ENCOUNTER — Ambulatory Visit (INDEPENDENT_AMBULATORY_CARE_PROVIDER_SITE_OTHER): Payer: Medicare Other | Admitting: Family Medicine

## 2021-06-14 VITALS — BP 140/84 | HR 81 | Temp 97.8°F | Ht 64.0 in | Wt 139.1 lb

## 2021-06-14 DIAGNOSIS — R600 Localized edema: Secondary | ICD-10-CM | POA: Diagnosis not present

## 2021-06-14 MED ORDER — APIXABAN 5 MG PO TABS: 5.0000 mg | ORAL_TABLET | Freq: Two times a day (BID) | ORAL | 0 refills | Status: DC

## 2021-06-14 NOTE — Patient Instructions (Signed)
We are setting up venous doppler left upper extremity. ? ?Start the Eliquis 5 mg tablets and take two tablets (10 mg ) by mouth every 12 hours until we get the doppler.  ? ?Go immediately to ER for any chest pain or shortness of breath.  ?

## 2021-06-14 NOTE — Progress Notes (Signed)
Established Patient Office Visit  Subjective:  Patient ID: Natasha Frederick, female    DOB: 11/23/1955  Age: 66 y.o. MRN: [id number]  CC:  Chief Complaint  Patient presents with   Joint Swelling    HPI Natasha Frederick presents for reported 2-week history of "knuckle swelling "on the left hand.  She had some mild hand pain but mostly swelling.  She had not noticed any edema of the forearm or arm.  She denies any injury.  Denies any dyspnea.  No chest pains.  No pleuritic pain.  She has no history of DVT.  She does have history of Huntington's disease.  Back in 2020 she had perforated gastric ulcer.  Postoperatively she developed bilateral lower extremity ischemia.  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  She had emergent surgery for bilateral popliteal and tibial embolectomies but ultimately required right below-knee amputation.  She is non-smoker.  She takes respite all per neurology for her Huntington's disease and also is on sertraline.  No hormonal therapy.  She denies any significant arthralgias.  No fever.  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 difficile diarrhea    Endometriosis    Huntington disease (HCC)    Peptic ulcer    Perforated gastric ulcer (HCC)    Scoliosis     Past Surgical History:  Procedure Laterality Date   AMPUTATION Right 08/08/2018   Procedure: AMPUTATION BELOW KNEE RIGHT LOWER EXTREMITY;  Surgeon: Larina EarthlyEarly, Todd F, MD;  [facility];  Service: Vascular;  Laterality: Right;   ANTERIOR LAT LUMBAR FUSION Left 01/05/2014   Procedure: LUMBAR TWO TO THREE, LUMBAR LUMBAR THREE TO FOUR, ANTERIOR LATERAL LUMBAR FUSION 2 LEVELS;  Surgeon: Reinaldo Meekerandy O Kritzer, MD;  Location: [facility];  Service: Neurosurgery;  Laterality: Left;   APPENDECTOMY  1963   BIOPSY  11/25/2018   Procedure:  BIOPSY;  Surgeon: Sherrilyn Ristanis, Henry L III, MD;  Location: [facility];  Service: Gastroenterology;;   BREAST SURGERY Left 1987   bx   COLONOSCOPY W/ POLYPECTOMY     ESOPHAGOGASTRODUODENOSCOPY (EGD) WITH PROPOFOL N/A 11/25/2018   Procedure: ESOPHAGOGASTRODUODENOSCOPY (EGD) WITH PROPOFOL;  Surgeon: Sherrilyn Ristanis, Henry L III, MD;  [facility]: [facility];  Service: Gastroenterology;  Laterality: N/A;   EYE SURGERY Bilateral    FEMORAL-POPLITEAL BYPASS GRAFT Bilateral 08/06/2018   Procedure: BILATERAL POPLITEAL AND TIBIAL EMBOLECTOMIES, LEFT LEG FASCIOTOMY;  Surgeon: Larina EarthlyEarly, Todd F, MD;  [facility]: [facility];  Service: Vascular;  Laterality: Bilateral;   LAPAROSCOPY N/A 07/30/2018   Procedure: DIAGNOSTIC LAPAROSCOPY, Omentopexy gram patch, Washout of intra-abdominal abcesses x 3;  Surgeon: Karie SodaGross, Steven, MD;  Location: [facility];  Service: General;  Laterality: N/A;   OOPHORECTOMY Left 1977   Planters wart Left 2005   radial keratoto Bilateral    TONSILLECTOMY  1958   TOTAL HIP ARTHROPLASTY Left 07/19/2014   Procedure: TOTAL HIP ARTHROPLASTY;  Surgeon: Gean BirchwoodFrank Rowan, MD;  Location: [facility];  Service: Orthopedics;  Laterality: Left;   TUBAL LIGATION  1986    Family History  Problem Relation Age of Onset   Lung cancer Mother    Stroke Father    Huntington's disease Paternal Grandmother    Huntington's disease Paternal Aunt    Huntington's disease Paternal Uncle    Arthritis Neg Hx  family hx   Cancer Neg Hx        prostate ca -family hx   Heart disease Neg Hx        family hx   Colon cancer Neg Hx    Esophageal cancer Neg Hx    Pancreatic cancer Neg Hx    Liver disease Neg Hx     Social History   Socioeconomic History   Marital status: Married    Spouse name: Not on file   Number of children: Not on file   Years of education: Not on file   Highest education level: Not on file  Occupational History   Occupation: unemployed    Comment: disabled back pain  Tobacco Use   Smoking status: Former     Packs/day: 1.00    Years: 8.00    Pack years: 8.00    Types: Cigarettes    Quit date: 01/17/1994    Years since quitting: 27.4   Smokeless tobacco: Never  Vaping Use   Vaping Use: Never used  Substance and Sexual Activity   Alcohol use: Yes    Alcohol/week: 3.0 standard drinks    Types: 3 Cans of beer per week    Comment: per week   Drug use: No   Sexual activity: Not on file  Other Topics Concern   Not on file  Social History Narrative   Married   Right BKA    Disabled due to back problems       Social Determinants of Health   Financial Resource Strain: Low Risk    Difficulty of Paying Living Expenses: Not hard at all  Food Insecurity: No Food Insecurity   Worried About Programme researcher, broadcasting/film/video in the Last Year: Never true   Barista in the Last Year: Never true  Transportation Needs: No Transportation Needs   Lack of Transportation (Medical): No   Lack of Transportation (Non-Medical): No  Physical Activity: Inactive   Days of Exercise per Week: 0 days   Minutes of Exercise per Session: 0 min  Stress: No Stress Concern Present   Feeling of Stress : Not at all  Social Connections: Moderately Integrated   Frequency of Communication with Friends and Family: Twice a week   Frequency of Social Gatherings with Friends and Family: Twice a week   Attends Religious Services: Never   Database administrator or Organizations: Yes   Attends Engineer, structural: More than 4 times per year   Marital Status: Married  Catering manager Violence: Not At Risk   Fear of Current or Ex-Partner: No   Emotionally Abused: No   Physically Abused: No   Sexually Abused: No    Outpatient Medications Prior to Visit  Medication Sig Dispense Refill   acetaminophen (TYLENOL) 500 MG tablet Take 1,000 mg by mouth every morning.     ciclopirox (LOPROX) 0.77 % cream Apply topically 2 (two) times daily. 15 g 1   oxyCODONE-acetaminophen (PERCOCET/ROXICET) 5-325 MG tablet Take 1 tablet  by mouth every 8 (eight) hours as needed for severe pain. 60 tablet 0   risperiDONE (RISPERDAL) 0.5 MG tablet Take 0.5 mg by mouth 2 (two) times daily.     sennosides-docusate sodium (SENOKOT-S) 8.6-50 MG tablet Take 1 tablet by mouth daily.     sertraline (ZOLOFT) 100 MG tablet TAKE 1 TABLET(100 MG) BY MOUTH DAILY 90 tablet 0   No facility-administered medications prior to visit.    Allergies  Allergen Reactions  Bee Venom Anaphylaxis and Swelling   Nsaids Other (See Comments)    PERFORATED ULCER = NO MORE NSAIDs   Adhesive [Tape] Other (See Comments)    redness   Codeine Nausea And Vomiting   Penicillins     Did it involve swelling of the face/tongue/throat, SOB, or low BP? Yes Did it involve sudden or severe rash/hives, skin peeling, or any reaction on the inside of your mouth or nose? Unknown Did you need to seek medical attention at a hospital or doctor's office? Yes When did it last happen?   age 31-40    If all above answers are NO, may proceed with cephalosporin use.    Sulfa Antibiotics Itching    ROS Review of Systems  Constitutional:  Negative for chills and fever.  Respiratory:  Negative for cough and shortness of breath.   Cardiovascular:  Negative for chest pain.  Gastrointestinal:  Negative for abdominal pain.  Neurological:  Negative for weakness.  Hematological:  Negative for adenopathy.     Objective:    Physical Exam Vitals reviewed.  Constitutional:      Appearance: Normal appearance.  Cardiovascular:     Rate and Rhythm: Normal rate and regular rhythm.  Pulmonary:     Effort: Pulmonary effort is normal.     Breath sounds: Normal breath sounds.  Musculoskeletal:     Comments: She has some obvious swelling of her left hand especially over the MCP joints but diffusely involving the hand.  In looking more proximally though she has swelling as well of the entire left forearm and arm compared to the right side.  There is no localized tenderness.  No  warmth.  No ecchymosis.  No bony tenderness.  Good capillary refill left hand.  Good distal pulses.  Neurological:     Mental Status: She is alert.    BP 140/84 (BP Location: Left Arm, Patient Position: Sitting, Cuff Size: Normal)    Pulse 81    Temp 97.8 F (36.6 C) (Oral)    Ht 5\' 4"  (1.626 m)    Wt 139 lb 1.6 oz (63.1 kg)    SpO2 93%    BMI 23.88 kg/m  Wt Readings from Last 3 Encounters:  06/14/21 139 lb 1.6 oz (63.1 kg)  09/14/20 139 lb 1.6 oz (63.1 kg)  09/14/20 139 lb (63 kg)     Health Maintenance Due  Topic Date Due   COVID-19 Vaccine (1) Never done   MAMMOGRAM  Never done   COLONOSCOPY (Pts 45-55yrs Insurance coverage will need to be confirmed)  01/07/2013   DEXA SCAN  Never done   Pneumonia Vaccine 27+ Years old (3) 08/11/2019    There are no preventive care reminders to display for this patient.  Lab Results  Component Value Date   TSH 2.52 10/09/2017   Lab Results  Component Value Date   WBC 16.2 (H) 08/13/2018   HGB 8.8 (L) 08/13/2018   HCT 27.4 (L) 08/13/2018   MCV 87.3 08/13/2018   PLT 1,200 (HH) 08/13/2018   Lab Results  Component Value Date   NA 136 08/13/2018   K 3.3 (L) 08/13/2018   CO2 23 08/13/2018   GLUCOSE 93 08/13/2018   BUN 5 (L) 08/13/2018   CREATININE 0.52 08/13/2018   BILITOT 0.8 07/30/2018   ALKPHOS 52 07/30/2018   AST 44 (H) 07/30/2018   ALT 23 07/30/2018   PROT 5.8 (L) 07/30/2018   ALBUMIN 3.2 (L) 07/30/2018   CALCIUM 8.1 (L) 08/13/2018  ANIONGAP 10 08/13/2018   GFR 132.90 07/28/2018   Lab Results  Component Value Date   CHOL 200 10/09/2017   Lab Results  Component Value Date   HDL 68.70 10/09/2017   Lab Results  Component Value Date   LDLCALC 108 (H) 10/09/2017   Lab Results  Component Value Date   TRIG 113.0 10/09/2017   Lab Results  Component Value Date   CHOLHDL 3 10/09/2017   No results found for: HGBA1C    Assessment & Plan:   Problem List Items Addressed This Visit   None Visit Diagnoses      Edema of left upper extremity    -  Primary   Relevant Orders   DOPPLER VENOUS ARMS BILATERAL     Patient presents with 2-week history of edema involving left upper extremity.  Patient had only noticed hand involvement but she has some obvious involvement of the left upper extremity including the arm.  Schedule venous Dopplers left upper extremity.  We will try to get this is soon as possible.  We did discuss starting Eliquis 10 mg twice daily pending Doppler result and hopefully can get Doppler by this Friday.  Patient was instructed to go immediately to the ER for any dyspnea or chest pain or other concerns.  We were able to provide samples of Eliquis 5 mg and she was given explicit instructions to take 2 tablets (10 mg) by mouth twice daily until follow-up with Doppler  Meds ordered this encounter  Medications   apixaban (ELIQUIS) 5 MG TABS tablet    Sig: Take 1 tablet (5 mg total) by mouth 2 (two) times daily.    Dispense:  7 tablet    Refill:  0    Lot: [id number] Exp: 11/07/2022    Follow-up: No follow-ups on file.    Evelena Peat, MD

## 2021-06-15 ENCOUNTER — Telehealth: Payer: Self-pay | Admitting: Family Medicine

## 2021-06-15 NOTE — Telephone Encounter (Signed)
Please advise 

## 2021-06-15 NOTE — Telephone Encounter (Signed)
Patient husband stopped by because they were unsure of where patient was supposed to go for the imaging Dr.Burchette wanted done. Upon looking and not seeing anything, I called CMA who also didn't see any orders and advised me to send message back to Dr.burchette. I let patient's spouse know that I was sending the message back and when the order was placed they will receive a call to schedule. He stated that was fine but patient was placed on Eliquis and will run out by Saturday, but Dr.Burchette wanted patient to take it while she waited to get in for the imaging. If imaging will not be done tomorrow(03/09), please send in a temporary refill to hold patient over until orders are placed and patient is seen. ? ? ?Please send to ? ? ?[id number] - [place], [place] - [address] Phone:  [phone]  ?Fax:  [phone]  ?  ? ? ? ?Please advise  ?

## 2021-06-16 ENCOUNTER — Telehealth: Payer: Self-pay | Admitting: Family Medicine

## 2021-06-16 ENCOUNTER — Other Ambulatory Visit (HOSPITAL_COMMUNITY): Payer: Self-pay | Admitting: Family Medicine

## 2021-06-16 ENCOUNTER — Ambulatory Visit (HOSPITAL_COMMUNITY)
Admission: RE | Admit: 2021-06-16 | Discharge: 2021-06-16 | Disposition: A | Discharge status: Home / Self Care | Payer: Medicare Other | Source: Ambulatory Visit | Attending: Cardiovascular Disease | Admitting: Cardiovascular Disease

## 2021-06-16 ENCOUNTER — Other Ambulatory Visit: Payer: Self-pay

## 2021-06-16 DIAGNOSIS — M7989 Other specified soft tissue disorders: Secondary | ICD-10-CM | POA: Diagnosis not present

## 2021-06-16 NOTE — Telephone Encounter (Signed)
I was messaged by Mykal to sch pt for stat doppler and a urgent referral. I advised Mykal that there was no stat US doppler but theres a [place] that was already scheduled. And theres a referral to [facility]	[place] but its a urgent referral and it has already been sent. Mykal states that the stat US is under CV procedure for Doppler , I advised that it needs to be under active order for me to be able to sch. Mykal stated  It looks like she has already been scheduled thank you for your help! I stated no problem.  ?

## 2021-06-16 NOTE — Telephone Encounter (Signed)
FYI ?Venous doppler scheduled for today at 1pm at [facility].     Called pt husband  Natasha Frederick informed of appointment, directions and phone number given.    ?

## 2021-06-16 NOTE — Telephone Encounter (Signed)
Pt has been scheduled.  °

## 2021-06-16 NOTE — Telephone Encounter (Signed)
Pt husband is calling and per husband the doppler was neg for blood clots and he would like to know the next step ?

## 2021-06-19 ENCOUNTER — Ambulatory Visit (INDEPENDENT_AMBULATORY_CARE_PROVIDER_SITE_OTHER): Payer: Medicare Other | Admitting: Family Medicine

## 2021-06-19 ENCOUNTER — Encounter: Payer: Self-pay | Admitting: Family Medicine

## 2021-06-19 VITALS — BP 120/70 | HR 85 | Temp 98.0°F | Ht 64.0 in | Wt 139.1 lb

## 2021-06-19 DIAGNOSIS — M2550 Pain in unspecified joint: Secondary | ICD-10-CM

## 2021-06-19 NOTE — Progress Notes (Signed)
Established Patient Office Visit  Subjective:  Patient ID: Natasha Frederick, female    DOB: 10-Jul-1955  Age: 66 y.o. MRN: [id number]  CC:  Chief Complaint  Patient presents with   Follow-up    HPI Natasha Frederick presents for persistent swelling left upper extremity.  Refer to last note for details.  She was seen last week with relatively acute onset of left upper extremity swelling especially left MCP joints and left wrist.  We noted on exam though that her entire left upper extremity looked more swollen.  She was sent for venous Doppler left upper extremity last Friday which showed no evidence for DVT.  We had placed her empirically on Eliquis prior to her scan and she is now off that.  She does have some pain in her left wrist and MCP joints which she rates 6 out of 10 in severity.  No obvious warmth.  No erythema.  No injury.  Denies any skin rashes or cellulitis changes.  No fevers or chills.  No other arthralgias  Past Medical History:  Diagnosis Date   Acute embolism and thrombosis of deep vein of both distal lower extremities (HCC)    Arthritis    hip   C. difficile diarrhea    Endometriosis    Huntington disease (HCC)    Peptic ulcer    Perforated gastric ulcer (HCC)    Scoliosis     Past Surgical History:  Procedure Laterality Date   AMPUTATION Right 08/08/2018   Procedure: AMPUTATION BELOW KNEE RIGHT LOWER EXTREMITY;  Surgeon: Larina Earthly, MD;  [facility]: [facility];  Service: Vascular;  Laterality: Right;   ANTERIOR LAT LUMBAR FUSION Left 01/05/2014   Procedure: LUMBAR TWO TO THREE, LUMBAR LUMBAR THREE TO FOUR, ANTERIOR LATERAL LUMBAR FUSION 2 LEVELS;  Surgeon: Reinaldo Meeker, MD;  Location: [facility];  Service: Neurosurgery;  Laterality: Left;   APPENDECTOMY  1963   BIOPSY  11/25/2018   Procedure: BIOPSY;  Surgeon: Sherrilyn Rist, MD;  [facility]: [facility];  Service: Gastroenterology;;   BREAST SURGERY Left 1987   bx   COLONOSCOPY W/ POLYPECTOMY      ESOPHAGOGASTRODUODENOSCOPY (EGD) WITH PROPOFOL N/A 11/25/2018   Procedure: ESOPHAGOGASTRODUODENOSCOPY (EGD) WITH PROPOFOL;  Surgeon: Sherrilyn Rist, MD;  [facility]: [facility];  Service: Gastroenterology;  Laterality: N/A;   EYE SURGERY Bilateral    FEMORAL-POPLITEAL BYPASS GRAFT Bilateral 08/06/2018   Procedure: BILATERAL POPLITEAL AND TIBIAL EMBOLECTOMIES, LEFT LEG FASCIOTOMY;  Surgeon: Larina Earthly, MD;  [facility]: [facility];  Service: Vascular;  Laterality: Bilateral;   LAPAROSCOPY N/A 07/30/2018   Procedure: DIAGNOSTIC LAPAROSCOPY, Omentopexy gram patch, Washout of intra-abdominal abcesses x 3;  Surgeon: Karie Soda, MD;  Location: [facility];  Service: General;  Laterality: N/A;   OOPHORECTOMY Left 1977   Planters wart Left 2005   radial keratoto Bilateral    TONSILLECTOMY  1958   TOTAL HIP ARTHROPLASTY Left 07/19/2014   Procedure: TOTAL HIP ARTHROPLASTY;  Surgeon: Gean Birchwood, MD;  [facility]: [facility];  Service: Orthopedics;  Laterality: Left;   TUBAL LIGATION  1986    Family History  Problem Relation Age of Onset   Lung cancer Mother    Stroke Father    Huntington's disease Paternal Grandmother    Huntington's disease Paternal Aunt    Huntington's disease Paternal Uncle    Arthritis Neg Hx        family hx   Cancer Neg Hx        prostate  ca -family hx   Heart disease Neg Hx        family hx   Colon cancer Neg Hx    Esophageal cancer Neg Hx    Pancreatic cancer Neg Hx    Liver disease Neg Hx     Social History   Socioeconomic History   Marital status: Married    Spouse name: Not on file   Number of children: Not on file   Years of education: Not on file   Highest education level: Not on file  Occupational History   Occupation: unemployed    Comment: disabled back pain  Tobacco Use   Smoking status: Former    Packs/day: 1.00    Years: 8.00    Pack years: 8.00    Types: Cigarettes    Quit date: 01/17/1994    Years since quitting: 27.4   Smokeless tobacco: Never   Vaping Use   Vaping Use: Never used  Substance and Sexual Activity   Alcohol use: Yes    Alcohol/week: 3.0 standard drinks    Types: 3 Cans of beer per week    Comment: per week   Drug use: No   Sexual activity: Not on file  Other Topics Concern   Not on file  Social History Narrative   Married   Right BKA    Disabled due to back problems       Social Determinants of Health   Financial Resource Strain: Low Risk    Difficulty of Paying Living Expenses: Not hard at all  Food Insecurity: No Food Insecurity   Worried About Programme researcher, broadcasting/film/video in the Last Year: Never true   Barista in the Last Year: Never true  Transportation Needs: No Transportation Needs   Lack of Transportation (Medical): No   Lack of Transportation (Non-Medical): No  Physical Activity: Inactive   Days of Exercise per Week: 0 days   Minutes of Exercise per Session: 0 min  Stress: No Stress Concern Present   Feeling of Stress : Not at all  Social Connections: Moderately Integrated   Frequency of Communication with Friends and Family: Twice a week   Frequency of Social Gatherings with Friends and Family: Twice a week   Attends Religious Services: Never   Database administrator or Organizations: Yes   Attends Engineer, structural: More than 4 times per year   Marital Status: Married  Catering manager Violence: Not At Risk   Fear of Current or Ex-Partner: No   Emotionally Abused: No   Physically Abused: No   Sexually Abused: No    Outpatient Medications Prior to Visit  Medication Sig Dispense Refill   acetaminophen (TYLENOL) 500 MG tablet Take 1,000 mg by mouth every morning.     ciclopirox (LOPROX) 0.77 % cream Apply topically 2 (two) times daily. 15 g 1   oxyCODONE-acetaminophen (PERCOCET/ROXICET) 5-325 MG tablet Take 1 tablet by mouth every 8 (eight) hours as needed for severe pain. 60 tablet 0   risperiDONE (RISPERDAL) 0.5 MG tablet Take 0.5 mg by mouth 2 (two) times daily.      sennosides-docusate sodium (SENOKOT-S) 8.6-50 MG tablet Take 1 tablet by mouth daily.     sertraline (ZOLOFT) 100 MG tablet TAKE 1 TABLET(100 MG) BY MOUTH DAILY 90 tablet 0   apixaban (ELIQUIS) 5 MG TABS tablet Take 1 tablet (5 mg total) by mouth 2 (two) times daily. 7 tablet 0   No facility-administered medications prior to visit.  Allergies  Allergen Reactions   Bee Venom Anaphylaxis and Swelling   Nsaids Other (See Comments)    PERFORATED ULCER = NO MORE NSAIDs   Adhesive [Tape] Other (See Comments)    redness   Codeine Nausea And Vomiting   Penicillins     Did it involve swelling of the face/tongue/throat, SOB, or low BP? Yes Did it involve sudden or severe rash/hives, skin peeling, or any reaction on the inside of your mouth or nose? Unknown Did you need to seek medical attention at a hospital or doctor's office? Yes When did it last happen?   age 86-40    If all above answers are NO, may proceed with cephalosporin use.    Sulfa Antibiotics Itching    ROS Review of Systems  Constitutional:  Negative for chills and fever.  Musculoskeletal:  Positive for arthralgias. Negative for back pain and myalgias.  Skin:  Negative for rash.  Hematological:  Negative for adenopathy.     Objective:    Physical Exam Vitals reviewed.  Constitutional:      Appearance: Normal appearance.  Cardiovascular:     Rate and Rhythm: Normal rate and regular rhythm.  Musculoskeletal:     Comments: She has some obvious swelling of her left hand especially MCP joints.  No significant warmth.  No erythema.  She continues to have some edema involving her left forearm as well and left arm.  There is no rash.  No cellulitis changes.  No localized bony tenderness.  She has good range of motion left shoulder.  Neurological:     Mental Status: She is alert.     Comments: Generally weak with handgrip bilaterally.  She does not have any focal weakness comparing left and right upper extremity    BP  120/70 (BP Location: Left Arm, Patient Position: Sitting, Cuff Size: Normal)    Pulse 85    Temp 98 F (36.7 C) (Oral)    Ht  (1.626 m)    Wt 139 lb 1.6 oz (63.1 kg)    SpO2 93%    BMI 23.88 kg/m  Wt Readings from Last 3 Encounters:  06/19/21 139 lb 1.6 oz (63.1 kg)  06/14/21 139 lb 1.6 oz (63.1 kg)  09/14/20 139 lb 1.6 oz (63.1 kg)     Health Maintenance Due  Topic Date Due   COVID-19 Vaccine (1) Never done   MAMMOGRAM  Never done   COLONOSCOPY (Pts 45-37yrs Insurance coverage will need to be confirmed)  01/07/2013   DEXA SCAN  Never done   Pneumonia Vaccine 76+ Years old (3) 08/11/2019    There are no preventive care reminders to display for this patient.  Lab Results  Component Value Date   TSH 2.52 10/09/2017   Lab Results  Component Value Date   WBC 16.2 (H) 08/13/2018   HGB 8.8 (L) 08/13/2018   HCT 27.4 (L) 08/13/2018   MCV 87.3 08/13/2018   PLT 1,200 (HH) 08/13/2018   Lab Results  Component Value Date   NA 136 08/13/2018   K 3.3 (L) 08/13/2018   CO2 23 08/13/2018   GLUCOSE 93 08/13/2018   BUN 5 (L) 08/13/2018   CREATININE 0.52 08/13/2018   BILITOT 0.8 07/30/2018   ALKPHOS 52 07/30/2018   AST 44 (H) 07/30/2018   ALT 23 07/30/2018   PROT 5.8 (L) 07/30/2018   ALBUMIN 3.2 (L) 07/30/2018   CALCIUM 8.1 (L) 08/13/2018   ANIONGAP 10 08/13/2018   GFR 132.90 07/28/2018   Lab  Results  Component Value Date   CHOL 200 10/09/2017   Lab Results  Component Value Date   HDL 68.70 10/09/2017   Lab Results  Component Value Date   LDLCALC 108 (H) 10/09/2017   Lab Results  Component Value Date   TRIG 113.0 10/09/2017   Lab Results  Component Value Date   CHOLHDL 3 10/09/2017   No results found for: HGBA1C    Assessment & Plan:   Problem List Items Addressed This Visit   None Visit Diagnoses     Arthralgia, unspecified joint    -  Primary   Relevant Orders   CBC with Differential/Platelet   Rheumatoid Factor   Cyclic citrul peptide antibody,  IgG   ANA   C-reactive Protein     Patient presents with subacute onset of left upper extremity swelling and some pain mostly involving MCP joints and left wrist.  Doubt acute inflammatory arthritis but this is in the differential.  She certainly does not have any bilateral involvement at this time.  Venous Doppler showed no evidence for DVT.  -Obtain screening labs as above including CBC, rheumatoid factor, CCP antibody, ANA, C-reactive protein.  No orders of the defined types were placed in this encounter.   Follow-up: No follow-ups on file.    Evelena PeatBruce Rebecka Oelkers, MD

## 2021-06-20 DIAGNOSIS — M2550 Pain in unspecified joint: Secondary | ICD-10-CM | POA: Diagnosis not present

## 2021-06-20 LAB — CBC WITH DIFFERENTIAL/PLATELET
Basophils Absolute: 0.1 10*3/uL (ref 0.0–0.1)
Basophils Relative: 0.7 % (ref 0.0–3.0)
Eosinophils Absolute: 0.1 10*3/uL (ref 0.0–0.7)
Eosinophils Relative: 1.3 % (ref 0.0–5.0)
HCT: 37.3 % (ref 36.0–46.0)
Hemoglobin: 12.6 g/dL (ref 12.0–15.0)
Lymphocytes Relative: 18.4 % (ref 12.0–46.0)
Lymphs Abs: 1.6 10*3/uL (ref 0.7–4.0)
MCHC: 33.7 g/dL (ref 30.0–36.0)
MCV: 97.8 fl (ref 78.0–100.0)
Monocytes Absolute: 0.8 10*3/uL (ref 0.1–1.0)
Monocytes Relative: 8.6 % (ref 3.0–12.0)
Neutro Abs: 6.3 10*3/uL (ref 1.4–7.7)
Neutrophils Relative %: 71 % (ref 43.0–77.0)
Platelets: 416 10*3/uL — ABNORMAL HIGH (ref 150.0–400.0)
RBC: 3.81 Mil/uL — ABNORMAL LOW (ref 3.87–5.11)
RDW: 14 % (ref 11.5–15.5)
WBC: 8.9 10*3/uL (ref 4.0–10.5)

## 2021-06-20 LAB — C-REACTIVE PROTEIN: CRP: <1 mg/dL (ref 0.5–20.0)

## 2021-06-20 NOTE — Addendum Note (Signed)
Addended by: Marian Sorrow D on: 06/20/2021 01:58 PM ? ? Modules accepted: Orders ? ?

## 2021-06-22 ENCOUNTER — Encounter: Payer: Self-pay | Admitting: *Deleted

## 2021-06-22 LAB — ANA: Anti Nuclear Antibody (ANA): NEGATIVE

## 2021-06-22 LAB — CYCLIC CITRUL PEPTIDE ANTIBODY, IGG: Cyclic Citrullin Peptide Ab: <16 UNITS

## 2021-06-22 LAB — RHEUMATOID FACTOR: Rheumatoid fact SerPl-aCnc: <14 [IU]/mL (ref <14)

## 2021-06-22 NOTE — Progress Notes (Signed)
Error

## 2021-06-26 DIAGNOSIS — H938X2 Other specified disorders of left ear: Secondary | ICD-10-CM | POA: Diagnosis not present

## 2021-06-26 DIAGNOSIS — H903 Sensorineural hearing loss, bilateral: Secondary | ICD-10-CM | POA: Diagnosis not present

## 2021-06-27 ENCOUNTER — Telehealth: Payer: Self-pay | Admitting: Family Medicine

## 2021-06-27 DIAGNOSIS — Z79899 Other long term (current) drug therapy: Secondary | ICD-10-CM | POA: Diagnosis not present

## 2021-06-27 DIAGNOSIS — R451 Restlessness and agitation: Secondary | ICD-10-CM | POA: Diagnosis not present

## 2021-06-27 DIAGNOSIS — G10 Huntington's disease: Secondary | ICD-10-CM | POA: Diagnosis not present

## 2021-06-27 NOTE — Telephone Encounter (Signed)
Patient husband called in stating after results from visit on 03/13, he wants to know what pt next steps should be. ? ?Lewis is requesting a phone call back at [phone]. ? ?Please advise. ?

## 2021-06-28 NOTE — Telephone Encounter (Signed)
I spoke with the pt's husband and informed him of message below. Pt's husband stated that pt had a mammogram last month and he reported that everything was normal at that time. ?

## 2021-06-28 NOTE — Telephone Encounter (Signed)
Left a message for the pt to return my call.  

## 2021-06-28 NOTE — Telephone Encounter (Signed)
Pt's husband informed of the message and verbalized understanding.  ?

## 2021-06-29 DIAGNOSIS — R296 Repeated falls: Secondary | ICD-10-CM | POA: Diagnosis not present

## 2021-06-29 DIAGNOSIS — R262 Difficulty in walking, not elsewhere classified: Secondary | ICD-10-CM | POA: Diagnosis not present

## 2021-06-29 DIAGNOSIS — R260 Ataxic gait: Secondary | ICD-10-CM | POA: Diagnosis not present

## 2021-06-29 DIAGNOSIS — Z9181 History of falling: Secondary | ICD-10-CM | POA: Diagnosis not present

## 2021-07-03 ENCOUNTER — Ambulatory Visit (INDEPENDENT_AMBULATORY_CARE_PROVIDER_SITE_OTHER): Payer: Medicare Other

## 2021-07-03 ENCOUNTER — Other Ambulatory Visit: Payer: Self-pay

## 2021-07-03 ENCOUNTER — Ambulatory Visit (INDEPENDENT_AMBULATORY_CARE_PROVIDER_SITE_OTHER): Payer: Medicare Other | Admitting: Family Medicine

## 2021-07-03 VITALS — BP 118/76 | HR 79 | Temp 97.5°F | Ht 64.0 in | Wt 139.1 lb

## 2021-07-03 DIAGNOSIS — J9811 Atelectasis: Secondary | ICD-10-CM | POA: Diagnosis not present

## 2021-07-03 DIAGNOSIS — J90 Pleural effusion, not elsewhere classified: Secondary | ICD-10-CM | POA: Diagnosis not present

## 2021-07-03 DIAGNOSIS — R0989 Other specified symptoms and signs involving the circulatory and respiratory systems: Secondary | ICD-10-CM

## 2021-07-03 DIAGNOSIS — R600 Localized edema: Secondary | ICD-10-CM

## 2021-07-03 DIAGNOSIS — R609 Edema, unspecified: Secondary | ICD-10-CM

## 2021-07-03 NOTE — Progress Notes (Addendum)
? ?Established Patient Office Visit ? ?Subjective:  ?Patient ID: Natasha Frederick, female    DOB: [birth year]  Age: 66 y.o. MRN: [id number] ? ?CC:  ?Chief Complaint  ?Patient presents with  ? Follow-up  ? ? ?HPI ?Natasha Frederick presents for persistent swelling left upper extremity with some hand pain.  She was seen here on March 8 and we sent her for venous Dopplers which showed no DVT.  Also no evidence for superficial vein thrombosis.  No evidence for cellulitis.  No arm injury.  No proximal arm or shoulder pain.  Patient did have abnormal mammogram back in December through [facility] and had ultrasound guided biopsy with benign results.  No atypical cells.  Those studies were reviewed today.  No history of similar swelling in the past.  Denies any cough or dyspnea.  We obtain labs to rule out any sort of inflammatory process and her CBC, C-reactive protein, ANA, rheumatoid factor, CCP antibodies were all unremarkable.   No appetite or weight changes.    She has Huntington's Disease with progressive neurologic decline.  ? ?Past Medical History:  ?Diagnosis Date  ? Acute embolism and thrombosis of deep vein of both distal lower extremities (HCC)   ? Arthritis   ? hip  ? C. difficile diarrhea   ? Endometriosis   ? Huntington disease (HCC)   ? Peptic ulcer   ? Perforated gastric ulcer (HCC)   ? Scoliosis   ? ? ?Past Surgical History:  ?Procedure Laterality Date  ? AMPUTATION Right 08/08/2018  ? Procedure: AMPUTATION BELOW KNEE RIGHT LOWER EXTREMITY;  Surgeon: Larina Earthly, MD;  [facility];  Service: Vascular;  Laterality: Right;  ? ANTERIOR LAT LUMBAR FUSION Left 01/05/2014  ? Procedure: LUMBAR TWO TO THREE, LUMBAR LUMBAR THREE TO FOUR, ANTERIOR LATERAL LUMBAR FUSION 2 LEVELS;  Surgeon: Reinaldo Meeker, MD;  [facility]: [facility];  Service: Neurosurgery;  Laterality: Left;  ? APPENDECTOMY  1963  ? BIOPSY  11/25/2018  ? Procedure: BIOPSY;  Surgeon: Sherrilyn Rist, MD;  Location: [facility];  Service:  Gastroenterology;;  ? BREAST SURGERY Left 1987  ? bx  ? COLONOSCOPY W/ POLYPECTOMY    ? ESOPHAGOGASTRODUODENOSCOPY (EGD) WITH PROPOFOL N/A 11/25/2018  ? Procedure: ESOPHAGOGASTRODUODENOSCOPY (EGD) WITH PROPOFOL;  Surgeon: Sherrilyn Rist, MD;  Location: [facility];  Service: Gastroenterology;  Laterality: N/A;  ? EYE SURGERY Bilateral   ? FEMORAL-POPLITEAL BYPASS GRAFT Bilateral 08/06/2018  ? Procedure: BILATERAL POPLITEAL AND TIBIAL EMBOLECTOMIES, LEFT LEG FASCIOTOMY;  Surgeon: Larina Earthly, MD;  [facility];  Service: Vascular;  Laterality: Bilateral;  ? LAPAROSCOPY N/A 07/30/2018  ? Procedure: DIAGNOSTIC LAPAROSCOPY, Omentopexy gram patch, Washout of intra-abdominal abcesses x 3;  Surgeon: Karie Soda, MD;  Location: [facility];  Service: General;  Laterality: N/A;  ? OOPHORECTOMY Left 1977  ? Planters wart Left 2005  ? radial keratoto Bilateral   ? TONSILLECTOMY  1958  ? TOTAL HIP ARTHROPLASTY Left 07/19/2014  ? Procedure: TOTAL HIP ARTHROPLASTY;  Surgeon: Gean Birchwood, MD;  [facility];  Service: Orthopedics;  Laterality: Left;  ? TUBAL LIGATION  1986  ? ? ?Family History  ?Problem Relation Age of Onset  ? Lung cancer Mother   ? Stroke Father   ? Huntington's disease Paternal Grandmother   ? Huntington's disease Paternal Aunt   ? Huntington's disease Paternal Uncle   ? Arthritis Neg Hx   ?     family hx  ? Cancer Neg Hx   ?  prostate ca -family hx  ? Heart disease Neg Hx   ?     family hx  ? Colon cancer Neg Hx   ? Esophageal cancer Neg Hx   ? Pancreatic cancer Neg Hx   ? Liver disease Neg Hx   ? ? ?Social History  ? ?Socioeconomic History  ? Marital status: Married  ?  Spouse name: Not on file  ? Number of children: Not on file  ? Years of education: Not on file  ? Highest education level: Not on file  ?Occupational History  ? Occupation: unemployed  ?  Comment: disabled back pain  ?Tobacco Use  ? Smoking status: Former  ?  Packs/day: 1.00  ?  Years: 8.00  ?  Pack years: 8.00  ?  Types: Cigarettes  ?   Quit date: 01/17/1994  ?  Years since quitting: 27.4  ? Smokeless tobacco: Never  ?Vaping Use  ? Vaping Use: Never used  ?Substance and Sexual Activity  ? Alcohol use: Yes  ?  Alcohol/week: 3.0 standard drinks  ?  Types: 3 Cans of beer per week  ?  Comment: per week  ? Drug use: No  ? Sexual activity: Not on file  ?Other Topics Concern  ? Not on file  ?Social History Narrative  ? Married  ? Right BKA   ? Disabled due to back problems  ?    ? ?Social Determinants of Health  ? ?Financial Resource Strain: Low Risk   ? Difficulty of Paying Living Expenses: Not hard at all  ?Food Insecurity: No Food Insecurity  ? Worried About Programme researcher, broadcasting/film/videounning Out of Food in the Last Year: Never true  ? Ran Out of Food in the Last Year: Never true  ?Transportation Needs: No Transportation Needs  ? Lack of Transportation (Medical): No  ? Lack of Transportation (Non-Medical): No  ?Physical Activity: Inactive  ? Days of Exercise per Week: 0 days  ? Minutes of Exercise per Session: 0 min  ?Stress: No Stress Concern Present  ? Feeling of Stress : Not at all  ?Social Connections: Moderately Integrated  ? Frequency of Communication with Friends and Family: Twice a week  ? Frequency of Social Gatherings with Friends and Family: Twice a week  ? Attends Religious Services: Never  ? Active Member of Clubs or Organizations: Yes  ? Attends BankerClub or Organization Meetings: More than 4 times per year  ? Marital Status: Married  ?Intimate Partner Violence: Not At Risk  ? Fear of Current or Ex-Partner: No  ? Emotionally Abused: No  ? Physically Abused: No  ? Sexually Abused: No  ? ? ?Outpatient Medications Prior to Visit  ?Medication Sig Dispense Refill  ? acetaminophen (TYLENOL) 500 MG tablet Take 1,000 mg by mouth every morning.    ? ciclopirox (LOPROX) 0.77 % cream Apply topically 2 (two) times daily. 15 g 1  ? oxyCODONE-acetaminophen (PERCOCET/ROXICET) 5-325 MG tablet Take 1 tablet by mouth every 8 (eight) hours as needed for severe pain. 60 tablet 0  ?  risperiDONE (RISPERDAL) 0.5 MG tablet Take 0.5 mg by mouth 2 (two) times daily.    ? sennosides-docusate sodium (SENOKOT-S) 8.6-50 MG tablet Take 1 tablet by mouth daily.    ? sertraline (ZOLOFT) 100 MG tablet TAKE 1 TABLET(100 MG) BY MOUTH DAILY 90 tablet 0  ? ?No facility-administered medications prior to visit.  ? ? ?Allergies  ?Allergen Reactions  ? Bee Venom Anaphylaxis and Swelling  ? Nsaids Other (See Comments)  ?  PERFORATED ULCER = NO MORE NSAIDs  ? Adhesive [Tape] Other (See Comments)  ?  redness  ? Codeine Nausea And Vomiting  ? Penicillins   ?  Did it involve swelling of the face/tongue/throat, SOB, or low BP? Yes ?Did it involve sudden or severe rash/hives, skin peeling, or any reaction on the inside of your mouth or nose? Unknown ?Did you need to seek medical attention at a hospital or doctor's office? Yes ?When did it last happen?   age 19-40    ?If all above answers are ?NO?, may proceed with cephalosporin use. ?  ? Sulfa Antibiotics Itching  ? ? ?ROS ?Review of Systems  ?Constitutional:  Negative for chills and fever.  ?Respiratory:  Negative for cough and shortness of breath.   ?Cardiovascular:  Negative for chest pain, palpitations and leg swelling.  ?Neurological:  Negative for weakness.  ?Hematological:  Negative for adenopathy.  ? ?  ?Objective:  ?  ?Physical Exam ?Vitals reviewed.  ?Cardiovascular:  ?   Rate and Rhythm: Normal rate and regular rhythm.  ?Pulmonary:  ?   Effort: Pulmonary effort is normal.  ?   Breath sounds: Normal breath sounds.  ?   Comments: No left axillary adenopathy or mass.   ?Musculoskeletal:  ?   Comments: No left axillary adenopathy.  She has some obvious swelling of the left hand wrist forearm and arm compared to the right.  No bruising.  No erythema.  No warmth.  No localized tenderness.  ?Neurological:  ?   Mental Status: She is alert.  ? ? ?BP 118/76 (BP Location: Left Arm, Patient Position: Sitting, Cuff Size: Normal)   Pulse 79   Temp (!) 97.5 ?F (36.4 ?C)  (Oral)   Ht 5\' 4"  (1.626 m)   Wt 139 lb 1.6 oz (63.1 kg)   SpO2 91%   BMI 23.88 kg/m?  ?Wt Readings from Last 3 Encounters:  ?07/03/21 139 lb 1.6 oz (63.1 kg)  ?06/19/21 139 lb 1.6 oz (63.1 kg)  ?06/14/21 139 lb 1.6 oz (63.1 k

## 2021-07-04 DIAGNOSIS — R296 Repeated falls: Secondary | ICD-10-CM | POA: Diagnosis not present

## 2021-07-04 DIAGNOSIS — R262 Difficulty in walking, not elsewhere classified: Secondary | ICD-10-CM | POA: Diagnosis not present

## 2021-07-04 DIAGNOSIS — Z9181 History of falling: Secondary | ICD-10-CM | POA: Diagnosis not present

## 2021-07-04 DIAGNOSIS — R260 Ataxic gait: Secondary | ICD-10-CM | POA: Diagnosis not present

## 2021-07-05 ENCOUNTER — Telehealth: Payer: Self-pay | Admitting: Family Medicine

## 2021-07-05 NOTE — Telephone Encounter (Signed)
Left a message for the pt to return my call.  

## 2021-07-05 NOTE — Progress Notes (Unsigned)
Discussed chest x-ray results with patient's husband.  She has some suggestion of interstitial edema.  Denies any cough.  No dyspnea.  No chest pain.  She does have persistent left upper extremity swelling with negative Doppler for DVT.  We did not see any obstructing masses on chest x-ray and discussed getting CT scan to rule out obstructing mass.  She already had recent mammogram and ultrasound of breast which did not show any evidence for breast cancer.  No history of lymphedema. ? ?Kristian Covey MD ?[facility] ? ?

## 2021-07-05 NOTE — Telephone Encounter (Signed)
Pt husband call and stated  he want dr.Burchette to know the pt do have a lot of coughing at night bed  ?

## 2021-07-06 DIAGNOSIS — Z9181 History of falling: Secondary | ICD-10-CM | POA: Diagnosis not present

## 2021-07-06 DIAGNOSIS — R296 Repeated falls: Secondary | ICD-10-CM | POA: Diagnosis not present

## 2021-07-06 DIAGNOSIS — R262 Difficulty in walking, not elsewhere classified: Secondary | ICD-10-CM | POA: Diagnosis not present

## 2021-07-06 DIAGNOSIS — R260 Ataxic gait: Secondary | ICD-10-CM | POA: Diagnosis not present

## 2021-07-06 NOTE — Telephone Encounter (Signed)
Left a message for the pt to return my call.  

## 2021-07-06 NOTE — Telephone Encounter (Signed)
Pt spouse called in returning mykal call. ? ?Please advise. ?

## 2021-07-10 MED ORDER — BENZONATATE 100 MG PO CAPS
ORAL_CAPSULE | ORAL | 0 refills | Status: DC
Start: 2021-07-10 — End: 2021-07-25

## 2021-07-10 NOTE — Addendum Note (Signed)
Addended by: Christy Sartorius on: 07/10/2021 02:12 PM ? ? Modules accepted: Orders ? ?

## 2021-07-10 NOTE — Telephone Encounter (Signed)
I spoke with the pt's husband and informed him of the message and rx has been sent.  ?

## 2021-07-10 NOTE — Telephone Encounter (Signed)
I spoke with the pt's husband and he reported her hand is still swollen and she has been experiencing some tingling. Pt's husband also stated the pt has been having a cough but mainly occurs at night before bed.  ?

## 2021-07-11 ENCOUNTER — Other Ambulatory Visit: Payer: Self-pay | Admitting: Family Medicine

## 2021-07-11 DIAGNOSIS — R260 Ataxic gait: Secondary | ICD-10-CM | POA: Diagnosis not present

## 2021-07-11 DIAGNOSIS — R262 Difficulty in walking, not elsewhere classified: Secondary | ICD-10-CM | POA: Diagnosis not present

## 2021-07-11 DIAGNOSIS — R296 Repeated falls: Secondary | ICD-10-CM | POA: Diagnosis not present

## 2021-07-11 DIAGNOSIS — Z9181 History of falling: Secondary | ICD-10-CM | POA: Diagnosis not present

## 2021-07-12 NOTE — Telephone Encounter (Signed)
Patient's husband called back to speak with Mykal regarding the CAT scan. ? ?Please advise. ?

## 2021-07-12 NOTE — Telephone Encounter (Signed)
I spoke with the pt's husband in regards to Ct chest and spoke with our referral coordinator in regards to getting this set up. Referral Coordinator had to send referral to [facility] instead of [facility] due to the office not being able to [employer]. Pt husband is aware  ?

## 2021-07-13 DIAGNOSIS — R262 Difficulty in walking, not elsewhere classified: Secondary | ICD-10-CM | POA: Diagnosis not present

## 2021-07-13 DIAGNOSIS — R260 Ataxic gait: Secondary | ICD-10-CM | POA: Diagnosis not present

## 2021-07-13 DIAGNOSIS — Z9181 History of falling: Secondary | ICD-10-CM | POA: Diagnosis not present

## 2021-07-13 DIAGNOSIS — R296 Repeated falls: Secondary | ICD-10-CM | POA: Diagnosis not present

## 2021-07-14 ENCOUNTER — Ambulatory Visit (HOSPITAL_BASED_OUTPATIENT_CLINIC_OR_DEPARTMENT_OTHER)
Admission: RE | Admit: 2021-07-14 | Discharge: 2021-07-14 | Disposition: A | Discharge status: Home / Self Care | Payer: Medicare Other | Source: Ambulatory Visit | Attending: Family Medicine | Admitting: Family Medicine

## 2021-07-14 DIAGNOSIS — R0989 Other specified symptoms and signs involving the circulatory and respiratory systems: Secondary | ICD-10-CM | POA: Insufficient documentation

## 2021-07-14 DIAGNOSIS — J181 Lobar pneumonia, unspecified organism: Secondary | ICD-10-CM | POA: Diagnosis not present

## 2021-07-14 DIAGNOSIS — R600 Localized edema: Secondary | ICD-10-CM | POA: Diagnosis not present

## 2021-07-14 DIAGNOSIS — I700 Atherosclerosis of aorta: Secondary | ICD-10-CM | POA: Diagnosis not present

## 2021-07-14 LAB — POCT I-STAT CREATININE: Creatinine, Ser: 0.5 mg/dL (ref 0.44–1.00)

## 2021-07-14 MED ORDER — IOHEXOL 300 MG/ML  SOLN
100.0000 mL | Freq: Once | INTRAMUSCULAR | Status: AC | PRN
Start: 2021-07-14 — End: 2021-07-14
  Administered 2021-07-14: 75 mL via INTRAVENOUS

## 2021-07-17 ENCOUNTER — Other Ambulatory Visit: Payer: Self-pay | Admitting: Family Medicine

## 2021-07-17 NOTE — Telephone Encounter (Signed)
Patient's husband called to get results for CT scan that was done on patient. I let him know that Dr.Burchette was out until the 17th.  ? ? ?Patient needs refill on oxyCODONE-acetaminophen (PERCOCET/ROXICET) 5-325 MG tablet ? ? ?Please send to ?[id number] - [place], [place] - [address] Phone:  [phone]  ?Fax:  [phone]  ?  ? ? ? ? ? ?Please advise  ?

## 2021-07-18 DIAGNOSIS — R296 Repeated falls: Secondary | ICD-10-CM | POA: Diagnosis not present

## 2021-07-18 DIAGNOSIS — Z9181 History of falling: Secondary | ICD-10-CM | POA: Diagnosis not present

## 2021-07-18 DIAGNOSIS — R260 Ataxic gait: Secondary | ICD-10-CM | POA: Diagnosis not present

## 2021-07-18 DIAGNOSIS — R262 Difficulty in walking, not elsewhere classified: Secondary | ICD-10-CM | POA: Diagnosis not present

## 2021-07-18 MED ORDER — OXYCODONE-ACETAMINOPHEN 5-325 MG PO TABS: 1.0000 | ORAL_TABLET | Freq: Three times a day (TID) | ORAL | 0 refills | Status: DC | PRN

## 2021-07-20 DIAGNOSIS — Z9181 History of falling: Secondary | ICD-10-CM | POA: Diagnosis not present

## 2021-07-20 DIAGNOSIS — R260 Ataxic gait: Secondary | ICD-10-CM | POA: Diagnosis not present

## 2021-07-20 DIAGNOSIS — R296 Repeated falls: Secondary | ICD-10-CM | POA: Diagnosis not present

## 2021-07-20 DIAGNOSIS — R262 Difficulty in walking, not elsewhere classified: Secondary | ICD-10-CM | POA: Diagnosis not present

## 2021-07-21 ENCOUNTER — Other Ambulatory Visit: Payer: Self-pay | Admitting: Family Medicine

## 2021-07-21 DIAGNOSIS — R059 Cough, unspecified: Secondary | ICD-10-CM

## 2021-07-24 ENCOUNTER — Telehealth: Payer: Self-pay | Admitting: Family Medicine

## 2021-07-24 NOTE — Telephone Encounter (Signed)
Last OV- 07/03/2021 ?Last refill- 07/10/2021--30 cap, no refills ? ?No future OV scheduled.  ?

## 2021-07-24 NOTE — Telephone Encounter (Signed)
Pt spouse calling requesting update on CT scan results. Requests call at [phone]. ?

## 2021-07-24 NOTE — Telephone Encounter (Signed)
Pt is calling and would like callback °

## 2021-07-24 NOTE — Telephone Encounter (Signed)
Pt's husband informed of the results and verbalized understanding.  ?

## 2021-07-25 DIAGNOSIS — R262 Difficulty in walking, not elsewhere classified: Secondary | ICD-10-CM | POA: Diagnosis not present

## 2021-07-25 DIAGNOSIS — R296 Repeated falls: Secondary | ICD-10-CM | POA: Diagnosis not present

## 2021-07-25 DIAGNOSIS — R260 Ataxic gait: Secondary | ICD-10-CM | POA: Diagnosis not present

## 2021-07-25 DIAGNOSIS — Z9181 History of falling: Secondary | ICD-10-CM | POA: Diagnosis not present

## 2021-07-27 DIAGNOSIS — R262 Difficulty in walking, not elsewhere classified: Secondary | ICD-10-CM | POA: Diagnosis not present

## 2021-07-27 DIAGNOSIS — Z9181 History of falling: Secondary | ICD-10-CM | POA: Diagnosis not present

## 2021-07-27 DIAGNOSIS — R296 Repeated falls: Secondary | ICD-10-CM | POA: Diagnosis not present

## 2021-07-27 DIAGNOSIS — R260 Ataxic gait: Secondary | ICD-10-CM | POA: Diagnosis not present

## 2021-08-01 ENCOUNTER — Telehealth: Payer: Self-pay | Admitting: Family Medicine

## 2021-08-01 ENCOUNTER — Ambulatory Visit (INDEPENDENT_AMBULATORY_CARE_PROVIDER_SITE_OTHER): Payer: Medicare Other | Admitting: Family Medicine

## 2021-08-01 ENCOUNTER — Encounter: Payer: Self-pay | Admitting: Family Medicine

## 2021-08-01 VITALS — BP 150/82 | HR 101 | Temp 97.8°F | Ht 64.0 in | Wt 139.1 lb

## 2021-08-01 DIAGNOSIS — R296 Repeated falls: Secondary | ICD-10-CM | POA: Diagnosis not present

## 2021-08-01 DIAGNOSIS — Z9181 History of falling: Secondary | ICD-10-CM | POA: Diagnosis not present

## 2021-08-01 DIAGNOSIS — R260 Ataxic gait: Secondary | ICD-10-CM | POA: Diagnosis not present

## 2021-08-01 DIAGNOSIS — R600 Localized edema: Secondary | ICD-10-CM

## 2021-08-01 DIAGNOSIS — R262 Difficulty in walking, not elsewhere classified: Secondary | ICD-10-CM | POA: Diagnosis not present

## 2021-08-01 DIAGNOSIS — S2232XD Fracture of one rib, left side, subsequent encounter for fracture with routine healing: Secondary | ICD-10-CM | POA: Diagnosis not present

## 2021-08-01 MED ORDER — OXYCODONE-ACETAMINOPHEN 5-325 MG PO TABS: 1.0000 | ORAL_TABLET | Freq: Three times a day (TID) | ORAL | 0 refills | Status: DC | PRN

## 2021-08-01 NOTE — Progress Notes (Signed)
?Subjective:  ?  ? Patient ID: Natasha Frederick, female   DOB: [birth year], 66 y.o.   MRN: [id number] ? ?HPI ? ?Seen for follow-up regarding left upper extremity edema.  She presented here in March with relatively acute swelling left upper extremity.  We initially had concern for DVT.  Venous Doppler was unremarkable.  Chest x-ray showed concern for some small bilateral pleural effusions and mild to moderate interstitial edema.  She complained of no dyspnea.  Denies any recent falls or injury. ?We obtained CT chest to further evaluate and results as below: ? ?IMPRESSION: ?1. Subacute mildly displaced fracture of the anterolateral aspect of ?the left first rib. This is associated with some adjacent soft ?tissue swelling and pleural thickening, resulting in some mild mass ?effect causing mild compression of the left subclavian vein which ?remains patent. ?2. Peripheral septal thickening and patchy airspace consolidation in ?the left upper lobe. This is relatively close to the rib fracture, ?and could reflect sequela of prior trauma, pulmonary infarct or less ?likely resolving infection. ?3. Aortic atherosclerosis, in addition to 2 vessel coronary artery ?disease. Please note that although the presence of coronary artery ?calcium documents the presence of coronary artery disease, the ?severity of this disease and any potential stenosis cannot be ?assessed on this non-gated CT examination. Assessment for potential ?risk factor modification, dietary therapy or pharmacologic therapy ?may be warranted, if clinically indicated. ?  ?Aortic Atherosclerosis (ICD10-I70.0). ? ?Edema not much changed.  Denies any left upper chest wall pain.  No dyspnea.  No pleuritic pain. ?He has Huntington's disease and limited mobility ? ?Past Medical History:  ?Diagnosis Date  ? Acute embolism and thrombosis of deep vein of both distal lower extremities (HCC)   ? Arthritis   ? hip  ? C. difficile diarrhea   ? Endometriosis   ? Huntington disease  ([place])   ? Peptic ulcer   ? Perforated gastric ulcer ([place])   ? Scoliosis   ? ?Past Surgical History:  ?Procedure Laterality Date  ? AMPUTATION Right 08/08/2018  ? Procedure: AMPUTATION BELOW KNEE RIGHT LOWER EXTREMITY;  Surgeon: Rosetta Posner, MD;  Location: [facility];  Service: Vascular;  Laterality: Right;  ? ANTERIOR LAT LUMBAR FUSION Left 01/05/2014  ? Procedure: LUMBAR TWO TO THREE, LUMBAR LUMBAR THREE TO FOUR, ANTERIOR LATERAL LUMBAR FUSION 2 LEVELS;  Surgeon: Faythe Ghee, MD;  Location: [facility];  Service: Neurosurgery;  Laterality: Left;  ? APPENDECTOMY  1963  ? BIOPSY  11/25/2018  ? Procedure: BIOPSY;  Surgeon: Doran Stabler, MD;  Location: [facility];  Service: Gastroenterology;;  ? [place]  ? bx  ? COLONOSCOPY W/ POLYPECTOMY    ? ESOPHAGOGASTRODUODENOSCOPY (EGD) WITH PROPOFOL N/A 11/25/2018  ? Procedure: ESOPHAGOGASTRODUODENOSCOPY (EGD) WITH PROPOFOL;  Surgeon: Doran Stabler, MD;  [facility];  Service: Gastroenterology;  Laterality: N/A;  ? EYE SURGERY Bilateral   ? FEMORAL-POPLITEAL BYPASS GRAFT Bilateral 08/06/2018  ? Procedure: BILATERAL POPLITEAL AND TIBIAL EMBOLECTOMIES, LEFT LEG FASCIOTOMY;  Surgeon: Rosetta Posner, MD;  Location: [facility];  Service: Vascular;  Laterality: Bilateral;  ? LAPAROSCOPY N/A 07/30/2018  ? Procedure: DIAGNOSTIC LAPAROSCOPY, Omentopexy gram patch, Washout of intra-abdominal abcesses x 3;  Surgeon: Michael Boston, MD;  Location: [facility];  Service: General;  Laterality: N/A;  ? OOPHORECTOMY Left 1977  ? Planters wart Left 2005  ? radial keratoto Bilateral   ? TONSILLECTOMY  1958  ? TOTAL HIP ARTHROPLASTY Left 07/19/2014  ? Procedure: TOTAL HIP  ARTHROPLASTY;  Surgeon: Frederik Pear, MD;  Location: [facility];  Service: Orthopedics;  Laterality: Left;  ? TUBAL LIGATION  1986  ? ? reports that she quit smoking about 27 years ago. Her smoking use included cigarettes. She has a 8.00 pack-year smoking history. She has never used smokeless tobacco. She reports  current alcohol use of about 3.0 standard drinks per week. She reports that she does not use drugs. ?family history includes Huntington's disease in her paternal aunt, paternal grandmother, and paternal uncle; Lung cancer in her mother; Stroke in her father. ?Allergies  ?Allergen Reactions  ? Bee Venom Anaphylaxis and Swelling  ? Nsaids Other (See Comments)  ?  PERFORATED ULCER = NO MORE NSAIDs  ? Adhesive [Tape] Other (See Comments)  ?  redness  ? Codeine Nausea And Vomiting  ? Penicillins   ?  Did it involve swelling of the face/tongue/throat, SOB, or low BP? Yes ?Did it involve sudden or severe rash/hives, skin peeling, or any reaction on the inside of your mouth or nose? Unknown ?Did you need to seek medical attention at a hospital or doctor's office? Yes ?When did it last happen?   age [age]    ?If all above answers are ?NO?, may proceed with cephalosporin use. ?  ? Sulfa Antibiotics Itching  ? ? ?  ? ?Review of Systems  ?Constitutional:  Negative for chills and fever.  ?Respiratory:  Negative for cough and shortness of breath.   ?Cardiovascular:  Negative for chest pain.  ? ?   ?Objective:  ? Physical Exam ?Vitals reviewed.  ?Constitutional:   ?   Appearance: Normal appearance.  ?Cardiovascular:  ?   Rate and Rhythm: Normal rate.  ?Pulmonary:  ?   Effort: Pulmonary effort is normal.  ?   Breath sounds: Normal breath sounds.  ?Musculoskeletal:  ?   Comments: Still has some mild swelling left upper extremity perhaps slightly improved compared to previous exam.  No erythema.  No warmth.  No localized tenderness.  Good distal pulses.  ?Neurological:  ?   Mental Status: She is alert.  ? ? ?   ?Assessment:  ?   ?Left upper extremity swelling probably secondary to left first rib fracture with adjacent edema and compression on the subclavian vein with patent vein and no evidence for DVT on recent imaging. ?   ?Plan:  ?   ?-Reassurance observation for now. ?-Follow-up immediately for any progressive swelling or other  concerns ?-Elevate left upper extremity frequently ? ?Eulas Post MD ?[facility] ? ?   ?

## 2021-08-01 NOTE — Telephone Encounter (Signed)
Pt requesting pain meds for pain and swelling of her left arm. Just had office visit today at 930.   ?[id number] - [place], [place] - [address] Phone:  [phone]  ?Fax:  [phone]  ?  ? ?

## 2021-08-01 NOTE — Telephone Encounter (Signed)
Refill sent.

## 2021-08-01 NOTE — Patient Instructions (Signed)
Suspect left upper extremity swelling will  improve some with time as first rib fracture heals and pressure on the subclavian vein subsides.    ?

## 2021-08-02 ENCOUNTER — Ambulatory Visit: Payer: Medicare Other | Admitting: Family Medicine

## 2021-08-02 ENCOUNTER — Telehealth: Payer: Self-pay | Admitting: Family Medicine

## 2021-08-02 NOTE — Telephone Encounter (Signed)
Pt's husband called in with concerns about the prescription for oxyCODONE-acetaminophen (PERCOCET/ROXICET) 5-325 MG tablet    just wanted to clarify that this is what the provider wants the patient to have. ?

## 2021-08-02 NOTE — Telephone Encounter (Signed)
Noted  

## 2021-08-03 DIAGNOSIS — R260 Ataxic gait: Secondary | ICD-10-CM | POA: Diagnosis not present

## 2021-08-03 DIAGNOSIS — Z9181 History of falling: Secondary | ICD-10-CM | POA: Diagnosis not present

## 2021-08-03 DIAGNOSIS — R262 Difficulty in walking, not elsewhere classified: Secondary | ICD-10-CM | POA: Diagnosis not present

## 2021-08-03 DIAGNOSIS — R296 Repeated falls: Secondary | ICD-10-CM | POA: Diagnosis not present

## 2021-08-03 NOTE — Telephone Encounter (Signed)
I spoke with the pt's husband and he wanted clarification on medication and to make sure it was the correct medication pt has been previously taking. Pt informed of message below ?

## 2021-08-03 NOTE — Telephone Encounter (Signed)
Left a message for the pt's husband to return my call.  ?

## 2021-08-17 DIAGNOSIS — R260 Ataxic gait: Secondary | ICD-10-CM | POA: Diagnosis not present

## 2021-08-17 DIAGNOSIS — R296 Repeated falls: Secondary | ICD-10-CM | POA: Diagnosis not present

## 2021-08-17 DIAGNOSIS — R262 Difficulty in walking, not elsewhere classified: Secondary | ICD-10-CM | POA: Diagnosis not present

## 2021-08-17 DIAGNOSIS — Z9181 History of falling: Secondary | ICD-10-CM | POA: Diagnosis not present

## 2021-08-18 ENCOUNTER — Other Ambulatory Visit: Payer: Self-pay | Admitting: Family Medicine

## 2021-08-18 NOTE — Telephone Encounter (Signed)
Pt need a refil on oxyCODONE-acetaminophen (PERCOCET/ROXICET) 5-325 MG tablet  ?[facility] #[id number] - [place], [place] - [address] Phone:  [phone]  ?Fax:  [phone]  ?  ? ?

## 2021-08-21 MED ORDER — OXYCODONE-ACETAMINOPHEN 5-325 MG PO TABS: 1.0000 | ORAL_TABLET | Freq: Three times a day (TID) | ORAL | 0 refills | Status: DC | PRN

## 2021-08-22 DIAGNOSIS — R296 Repeated falls: Secondary | ICD-10-CM | POA: Diagnosis not present

## 2021-08-22 DIAGNOSIS — Z9181 History of falling: Secondary | ICD-10-CM | POA: Diagnosis not present

## 2021-08-22 DIAGNOSIS — R260 Ataxic gait: Secondary | ICD-10-CM | POA: Diagnosis not present

## 2021-08-22 DIAGNOSIS — R262 Difficulty in walking, not elsewhere classified: Secondary | ICD-10-CM | POA: Diagnosis not present

## 2021-08-24 DIAGNOSIS — R260 Ataxic gait: Secondary | ICD-10-CM | POA: Diagnosis not present

## 2021-08-24 DIAGNOSIS — R296 Repeated falls: Secondary | ICD-10-CM | POA: Diagnosis not present

## 2021-08-24 DIAGNOSIS — R262 Difficulty in walking, not elsewhere classified: Secondary | ICD-10-CM | POA: Diagnosis not present

## 2021-08-24 DIAGNOSIS — Z9181 History of falling: Secondary | ICD-10-CM | POA: Diagnosis not present

## 2021-08-31 DIAGNOSIS — R296 Repeated falls: Secondary | ICD-10-CM | POA: Diagnosis not present

## 2021-08-31 DIAGNOSIS — Z9181 History of falling: Secondary | ICD-10-CM | POA: Diagnosis not present

## 2021-08-31 DIAGNOSIS — R262 Difficulty in walking, not elsewhere classified: Secondary | ICD-10-CM | POA: Diagnosis not present

## 2021-08-31 DIAGNOSIS — R260 Ataxic gait: Secondary | ICD-10-CM | POA: Diagnosis not present

## 2021-09-05 DIAGNOSIS — Z9181 History of falling: Secondary | ICD-10-CM | POA: Diagnosis not present

## 2021-09-05 DIAGNOSIS — R260 Ataxic gait: Secondary | ICD-10-CM | POA: Diagnosis not present

## 2021-09-05 DIAGNOSIS — R296 Repeated falls: Secondary | ICD-10-CM | POA: Diagnosis not present

## 2021-09-05 DIAGNOSIS — R262 Difficulty in walking, not elsewhere classified: Secondary | ICD-10-CM | POA: Diagnosis not present

## 2021-09-07 DIAGNOSIS — Z9181 History of falling: Secondary | ICD-10-CM | POA: Diagnosis not present

## 2021-09-07 DIAGNOSIS — R260 Ataxic gait: Secondary | ICD-10-CM | POA: Diagnosis not present

## 2021-09-07 DIAGNOSIS — R262 Difficulty in walking, not elsewhere classified: Secondary | ICD-10-CM | POA: Diagnosis not present

## 2021-09-07 DIAGNOSIS — R296 Repeated falls: Secondary | ICD-10-CM | POA: Diagnosis not present

## 2021-09-12 DIAGNOSIS — Z9181 History of falling: Secondary | ICD-10-CM | POA: Diagnosis not present

## 2021-09-12 DIAGNOSIS — R262 Difficulty in walking, not elsewhere classified: Secondary | ICD-10-CM | POA: Diagnosis not present

## 2021-09-12 DIAGNOSIS — R296 Repeated falls: Secondary | ICD-10-CM | POA: Diagnosis not present

## 2021-09-12 DIAGNOSIS — R260 Ataxic gait: Secondary | ICD-10-CM | POA: Diagnosis not present

## 2021-09-14 DIAGNOSIS — R260 Ataxic gait: Secondary | ICD-10-CM | POA: Diagnosis not present

## 2021-09-14 DIAGNOSIS — R296 Repeated falls: Secondary | ICD-10-CM | POA: Diagnosis not present

## 2021-09-14 DIAGNOSIS — R262 Difficulty in walking, not elsewhere classified: Secondary | ICD-10-CM | POA: Diagnosis not present

## 2021-09-14 DIAGNOSIS — Z9181 History of falling: Secondary | ICD-10-CM | POA: Diagnosis not present

## 2021-09-15 ENCOUNTER — Telehealth: Payer: Self-pay

## 2021-09-15 NOTE — Telephone Encounter (Signed)
Unsuccessful attempt to reach patient on preferred numbers listed in notes for scheduled AWV. Left message on voicemail okay to reschedule 

## 2021-09-18 ENCOUNTER — Telehealth: Payer: Self-pay | Admitting: Family Medicine

## 2021-09-18 NOTE — Telephone Encounter (Signed)
Left message for patient to call back and schedule Medicare Annual Wellness Visit (AWV) either virtually or in office. Left  my jabber number [phone]   Last AWV 09/14/20  ; please schedule at anytime with [facility] Nurse Health Advisor 1 or 2    

## 2021-09-19 ENCOUNTER — Telehealth: Payer: Self-pay | Admitting: Family Medicine

## 2021-09-19 DIAGNOSIS — Z9181 History of falling: Secondary | ICD-10-CM | POA: Diagnosis not present

## 2021-09-19 DIAGNOSIS — R260 Ataxic gait: Secondary | ICD-10-CM | POA: Diagnosis not present

## 2021-09-19 DIAGNOSIS — R296 Repeated falls: Secondary | ICD-10-CM | POA: Diagnosis not present

## 2021-09-19 DIAGNOSIS — R262 Difficulty in walking, not elsewhere classified: Secondary | ICD-10-CM | POA: Diagnosis not present

## 2021-09-19 MED ORDER — OXYCODONE-ACETAMINOPHEN 5-325 MG PO TABS: 1.0000 | ORAL_TABLET | Freq: Three times a day (TID) | ORAL | 0 refills | Status: DC | PRN

## 2021-09-19 NOTE — Telephone Encounter (Signed)
Pt requesting refill of oxyCODONE-acetaminophen (PERCOCET/ROXICET) 5-325 MG tablet   [facility] #[id number] - [place], [place] - [address] Phone:  [phone]  Fax:  [phone]

## 2021-09-21 ENCOUNTER — Ambulatory Visit (INDEPENDENT_AMBULATORY_CARE_PROVIDER_SITE_OTHER): Payer: Medicare Other

## 2021-09-21 VITALS — BP 118/60 | HR 74 | Temp 98.2°F | Ht 64.0 in | Wt 145.0 lb

## 2021-09-21 DIAGNOSIS — Z1211 Encounter for screening for malignant neoplasm of colon: Secondary | ICD-10-CM | POA: Diagnosis not present

## 2021-09-21 DIAGNOSIS — Z0000 Encounter for general adult medical examination without abnormal findings: Secondary | ICD-10-CM

## 2021-09-21 DIAGNOSIS — Z13820 Encounter for screening for osteoporosis: Secondary | ICD-10-CM | POA: Diagnosis not present

## 2021-09-21 NOTE — Patient Instructions (Addendum)
[name] , Thank you for taking time to come for your Medicare Wellness Visit. I appreciate your ongoing commitment to your health goals. Please review the following plan we discussed and let me know if I can assist you in the future.   These are the goals we discussed:  Goals       DIET - EAT MORE FRUITS AND VEGETABLES (pt-stated)      Eat more fruits to get proper nutrition.        This is a list of the screening recommended for you and due dates:  Health Maintenance  Topic Date Due   Mammogram  Never done   Colon Cancer Screening  01/07/2013   DEXA scan (bone density measurement)  Never done   COVID-19 Vaccine (1) 10/07/2021*   Pneumonia Vaccine (3 - PPSV23 if available, else PCV20) 09/22/2022*   Flu Shot  11/07/2021   Tetanus Vaccine  10/10/2027   Hepatitis C Screening: USPSTF Recommendation to screen - Ages 18-79 yo.  Completed   Zoster (Shingles) Vaccine  Completed   HPV Vaccine  Aged Out  *Topic was postponed. The date shown is not the original due date.    Opioid Pain Medicine Management Opioids are powerful medicines that are used to treat moderate to severe pain. When used for short periods of time, they can help you to: Sleep better. Do better in physical or occupational therapy. Feel better in the first few days after an injury. Recover from surgery. Opioids should be taken with the supervision of a trained health care provider. They should be taken for the shortest period of time possible. This is because opioids can be addictive, and the longer you take opioids, the greater your risk of addiction. This addiction can also be called opioid use disorder. What are the risks? Using opioid pain medicines for longer than 3 days increases your risk of side effects. Side effects include: Constipation. Nausea and vomiting. Breathing difficulties (respiratory depression). Drowsiness. Confusion. Opioid use disorder. Itching. Taking opioid pain medicine for a long period  of time can affect your ability to do daily tasks. It also puts you at risk for: Motor vehicle crashes. Depression. Suicide. Heart attack. Overdose, which can be life-threatening. What is a pain treatment plan? A pain treatment plan is an agreement between you and your health care provider. Pain is unique to each person, and treatments vary depending on your condition. To manage your pain, you and your health care provider need to work together. To help you do this: Discuss the goals of your treatment, including how much pain you might expect to have and how you will manage the pain. Review the risks and benefits of taking opioid medicines. Remember that a good treatment plan uses more than one approach and minimizes the chance of side effects. Be honest about the amount of medicines you take and about any drug or alcohol use. Get pain medicine prescriptions from only one health care provider. Pain can be managed with many types of alternative treatments. Ask your health care provider to refer you to one or more specialists who can help you manage pain through: Physical or occupational therapy. Counseling (cognitive behavioral therapy). Good nutrition. Biofeedback. Massage. Meditation. Non-opioid medicine. Following a gentle exercise program. How to use opioid pain medicine Taking medicine Take your pain medicine exactly as told by your health care provider. Take it only when you need it. If your pain gets less severe, you may take less than your prescribed dose if  your health care provider approves. If you are not having pain, do nottake pain medicine unless your health care provider tells you to take it. If your pain is severe, do nottry to treat it yourself by taking more pills than instructed on your prescription. Contact your health care provider for help. Write down the times when you take your pain medicine. It is easy to become confused while on pain medicine. Writing the time can  help you avoid overdose. Take other over-the-counter or prescription medicines only as told by your health care provider. Keeping yourself and others safe  While you are taking opioid pain medicine: Do not drive, use machinery, or power tools. Do not sign legal documents. Do not drink alcohol. Do not take sleeping pills. Do not supervise children by yourself. Do not do activities that require climbing or being in high places. Do not go to a lake, river, ocean, spa, or swimming pool. Do not share your pain medicine with anyone. Keep pain medicine in a locked cabinet or in a secure area where pets and children cannot reach it. Stopping your use of opioids If you have been taking opioid medicine for more than a few weeks, you may need to slowly decrease (taper) how much you take until you stop completely. Tapering your use of opioids can decrease your risk of symptoms of withdrawal, such as: Pain and cramping in the abdomen. Nausea. Sweating. Sleepiness. Restlessness. Uncontrollable shaking (tremors). Cravings for the medicine. Do not attempt to taper your use of opioids on your own. Talk with your health care provider about how to do this. Your health care provider may prescribe a step-down schedule based on how much medicine you are taking and how long you have been taking it. Getting rid of leftover pills Do not save any leftover pills. Get rid of leftover pills safely by: Taking the medicine to a prescription take-back program. This is usually offered by the county or law enforcement. Bringing them to a pharmacy that has a drug disposal container. Flushing them down the toilet. Check the label or package insert of your medicine to see whether this is safe to do. Throwing them out in the trash. Check the label or package insert of your medicine to see whether this is safe to do. If it is safe to throw it out, remove the medicine from the original container, put it into a sealable bag or  container, and mix it with used coffee grounds, food scraps, dirt, or cat litter before putting it in the trash. Follow these instructions at home: Activity Do exercises as told by your health care provider. Avoid activities that make your pain worse. Return to your normal activities as told by your health care provider. Ask your health care provider what activities are safe for you. General instructions You may need to take these actions to prevent or treat constipation: Drink enough fluid to keep your urine pale yellow. Take over-the-counter or prescription medicines. Eat foods that are high in fiber, such as beans, whole grains, and fresh fruits and vegetables. Limit foods that are high in fat and processed sugars, such as fried or sweet foods. Keep all follow-up visits. This is important. Where to find support If you have been taking opioids for a long time, you may benefit from receiving support for quitting from a local support group or counselor. Ask your health care provider for a referral to these resources in your area. Where to find more information Centers for Disease Control  and Prevention (CDC): FootballExhibition.com.br [place] Food and Drug Administration (FDA): PumpkinSearch.com.ee Get help right away if: You may have taken too much of an opioid (overdosed). Common symptoms of an overdose: Your breathing is slower or more shallow than normal. You have a very slow heartbeat (pulse). You have slurred speech. You have nausea and vomiting. Your pupils become very small. You have other potential symptoms: You are very confused. You faint or feel like you will faint. You have cold, clammy skin. You have blue lips or fingernails. You have thoughts of harming yourself or harming others. These symptoms may represent a serious problem that is an emergency. Do not wait to see if the symptoms will go away. Get medical help right away. Call your local emergency services (911 in the [place] Do not drive  yourself to the hospital.  If you ever feel like you may hurt yourself or others, or have thoughts about taking your own life, get help right away. Go to your nearest emergency department or: Call your local emergency services (911 in the [place] Call the [facility] ((623)128-0727 in the [place] Call a suicide crisis helpline, such as the National Suicide Prevention Lifeline at (270)819-3696 or 988 in the [place] This is open 24 hours a day in the [place] Text the Crisis Text Line at 506-694-0340 (in the [place] Summary Opioid medicines can help you manage moderate to severe pain for a short period of time. A pain treatment plan is an agreement between you and your health care provider. Discuss the goals of your treatment, including how much pain you might expect to have and how you will manage the pain. If you think that you or someone else may have taken too much of an opioid, get medical help right away. This information is not intended to replace advice given to you by your health care provider. Make sure you discuss any questions you have with your health care provider. Document Revised: 10/19/2020 Document Reviewed: 07/06/2020 Elsevier Patient Education  2023 Else[employer] Inc[employer]   Advanced directives: Yes  Conditions/risks identified: None  Next appointment: Follow up in one year for your annual wellness visit     Preventive Care 65 Years and Older, Female Preventive care refers to lifestyle choices and visits with your health care provider that can promote health and wellness. What does preventive care include? A yearly physical exam. This is also called an annual well check. Dental exams once or twice a year. Routine eye exams. Ask your health care provider how often you should have your eyes checked. Personal lifestyle choices, including: Daily care of your teeth and gums. Regular physical activity. Eating a healthy diet. Avoiding tobacco and drug use. Limiting alcohol  use. Practicing safe sex. Taking low-dose aspirin every day. Taking vitamin and mineral supplements as recommended by your health care provider. What happens during an annual well check? The services and screenings done by your health care provider during your annual well check will depend on your age, overall health, lifestyle risk factors, and family history of disease. Counseling  Your health care provider may ask you questions about your: Alcohol use. Tobacco use. Drug use. Emotional well-being. Home and relationship well-being. Sexual activity. Eating habits. History of falls. Memory and ability to understand (cognition). Work and work Astronomer. Reproductive health. Screening  You may have the following tests or measurements: Height, weight, and BMI. Blood pressure. Lipid and cholesterol levels. These may be checked every 5 years, or more frequently if you are over  20 years old. Skin check. Lung cancer screening. You may have this screening every year starting at age 23 if you have a 30-pack-year history of smoking and currently smoke or have quit within the past 15 years. Fecal occult blood test (FOBT) of the stool. You may have this test every year starting at age 45. Flexible sigmoidoscopy or colonoscopy. You may have a sigmoidoscopy every 5 years or a colonoscopy every 10 years starting at age 64. Hepatitis C blood test. Hepatitis B blood test. Sexually transmitted disease (STD) testing. Diabetes screening. This is done by checking your blood sugar (glucose) after you have not eaten for a while (fasting). You may have this done every 1-3 years. Bone density scan. This is done to screen for osteoporosis. You may have this done starting at age 90. Mammogram. This may be done every 1-2 years. Talk to your health care provider about how often you should have regular mammograms. Talk with your health care provider about your test results, treatment options, and if necessary,  the need for more tests. Vaccines  Your health care provider may recommend certain vaccines, such as: Influenza vaccine. This is recommended every year. Tetanus, diphtheria, and acellular pertussis (Tdap, Td) vaccine. You may need a Td booster every 10 years. Zoster vaccine. You may need this after age 77. Pneumococcal 13-valent conjugate (PCV13) vaccine. One dose is recommended after age 29. Pneumococcal polysaccharide (PPSV23) vaccine. One dose is recommended after age 63. Talk to your health care provider about which screenings and vaccines you need and how often you need them. This information is not intended to replace advice given to you by your health care provider. Make sure you discuss any questions you have with your health care provider. Document Released: 04/22/2015 Document Revised: 12/14/2015 Document Reviewed: 01/25/2015 Elsevier Interactive Patient Education  2017 [employer].  Fall Prevention in the Home Falls can cause injuries. They can happen to people of all ages. There are many things you can do to make your home safe and to help prevent falls. What can I do on the outside of my home? Regularly fix the edges of walkways and driveways and fix any cracks. Remove anything that might make you trip as you walk through a door, such as a raised step or threshold. Trim any bushes or trees on the path to your home. Use bright outdoor lighting. Clear any walking paths of anything that might make someone trip, such as rocks or tools. Regularly check to see if handrails are loose or broken. Make sure that both sides of any steps have handrails. Any raised decks and porches should have guardrails on the edges. Have any leaves, snow, or ice cleared regularly. Use sand or salt on walking paths during winter. Clean up any spills in your garage right away. This includes oil or grease spills. What can I do in the bathroom? Use night lights. Install grab bars by the toilet and in the  tub and shower. Do not use towel bars as grab bars. Use non-skid mats or decals in the tub or shower. If you need to sit down in the shower, use a plastic, non-slip stool. Keep the floor dry. Clean up any water that spills on the floor as soon as it happens. Remove soap buildup in the tub or shower regularly. Attach bath mats securely with double-sided non-slip rug tape. Do not have throw rugs and other things on the floor that can make you trip. What can I do in the bedroom?  Use night lights. Make sure that you have a light by your bed that is easy to reach. Do not use any sheets or blankets that are too big for your bed. They should not hang down onto the floor. Have a firm chair that has side arms. You can use this for support while you get dressed. Do not have throw rugs and other things on the floor that can make you trip. What can I do in the kitchen? Clean up any spills right away. Avoid walking on wet floors. Keep items that you use a lot in easy-to-reach places. If you need to reach something above you, use a strong step stool that has a grab bar. Keep electrical cords out of the way. Do not use floor polish or wax that makes floors slippery. If you must use wax, use non-skid floor wax. Do not have throw rugs and other things on the floor that can make you trip. What can I do with my stairs? Do not leave any items on the stairs. Make sure that there are handrails on both sides of the stairs and use them. Fix handrails that are broken or loose. Make sure that handrails are as long as the stairways. Check any carpeting to make sure that it is firmly attached to the stairs. Fix any carpet that is loose or worn. Avoid having throw rugs at the top or bottom of the stairs. If you do have throw rugs, attach them to the floor with carpet tape. Make sure that you have a light switch at the top of the stairs and the bottom of the stairs. If you do not have them, ask someone to add them for  you. What else can I do to help prevent falls? Wear shoes that: Do not have high heels. Have rubber bottoms. Are comfortable and fit you well. Are closed at the toe. Do not wear sandals. If you use a stepladder: Make sure that it is fully opened. Do not climb a closed stepladder. Make sure that both sides of the stepladder are locked into place. Ask someone to hold it for you, if possible. Clearly mark and make sure that you can see: Any grab bars or handrails. First and last steps. Where the edge of each step is. Use tools that help you move around (mobility aids) if they are needed. These include: Canes. Walkers. Scooters. Crutches. Turn on the lights when you go into a dark area. Replace any light bulbs as soon as they burn out. Set up your furniture so you have a clear path. Avoid moving your furniture around. If any of your floors are uneven, fix them. If there are any pets around you, be aware of where they are. Review your medicines with your doctor. Some medicines can make you feel dizzy. This can increase your chance of falling. Ask your doctor what other things that you can do to help prevent falls. This information is not intended to replace advice given to you by your health care provider. Make sure you discuss any questions you have with your health care provider. Document Released: 01/20/2009 Document Revised: 09/01/2015 Document Reviewed: 04/30/2014 Elsevier Interactive Patient Education  2017 [employer].

## 2021-09-21 NOTE — Progress Notes (Addendum)
Subjective:   Natasha Frederick is a 66 y.o. female who presents for Medicare Annual (Subsequent) preventive examination.  Review of Systems     Cardiac Risk Factors include: advanced age (>34men, >85 women);sedentary lifestyle     Objective:    Today's Vitals   09/21/21 1358  BP: 118/60  Pulse: 74  Temp: 98.2 F (36.8 C)  TempSrc: Oral  SpO2: 94%  Weight: 145 lb (65.8 kg)  Height: 5\' 4"  (1.626 m)   Body mass index is 24.89 kg/m.     09/21/2021    2:21 PM 09/14/2020    1:15 PM 05/27/2019   11:24 AM 11/24/2018   11:16 AM 11/11/2018    1:14 PM 09/16/2018    1:50 PM 09/04/2018   10:41 PM  Advanced Directives  Does Patient Have a Medical Advance Directive? Yes Yes Yes Yes Yes No No  Type of 09/06/2018 of [place];Living will Healthcare Power of [place];Living will Healthcare Power of [place];Living will Healthcare Power of [place];Living will Healthcare Power of [place];Living will    Does patient want to make changes to medical advance directive? No - Patient declined  No - Patient declined No - Patient declined No - Patient declined    Copy of Healthcare Power of Attorney in Chart? No - copy requested  No - copy requested  No - copy requested    Would patient like information on creating a medical advance directive?      No - Patient declined No - Patient declined    Current Medications (verified) Outpatient Encounter Medications as of 09/21/2021  Medication Sig   acetaminophen (TYLENOL) 500 MG tablet Take 1,000 mg by mouth every morning.   benzonatate (TESSALON) 100 MG capsule TAKE 1 CAPSULE(100 MG) BY MOUTH EVERY 8 HOURS AS NEEDED FOR COUGH   ciclopirox (LOPROX) 0.77 % cream Apply topically 2 (two) times daily.   oxyCODONE-acetaminophen (PERCOCET/ROXICET) 5-325 MG tablet Take 1 tablet by mouth every 8 (eight) hours as needed for severe pain.   risperiDONE (RISPERDAL) 0.5 MG tablet Take 0.5 mg by mouth 2 (two) times daily.   sennosides-docusate sodium  (SENOKOT-S) 8.6-50 MG tablet Take 1 tablet by mouth daily.   sertraline (ZOLOFT) 100 MG tablet TAKE 1 TABLET(100 MG) BY MOUTH DAILY   No facility-administered encounter medications on file as of 09/21/2021.    Allergies (verified) Bee venom, Nsaids, Adhesive [tape], Codeine, Penicillins, and Sulfa antibiotics   History: Past Medical History:  Diagnosis Date   Acute embolism and thrombosis of deep vein of both distal lower extremities (HCC)    Arthritis    hip   C. difficile diarrhea    Endometriosis    Huntington disease (HCC)    Peptic ulcer    Perforated gastric ulcer (HCC)    Scoliosis    Past Surgical History:  Procedure Laterality Date   AMPUTATION Right 08/08/2018   Procedure: AMPUTATION BELOW KNEE RIGHT LOWER EXTREMITY;  Surgeon: 10/08/2018, MD;  [facility];  Service: Vascular;  Laterality: Right;   ANTERIOR LAT LUMBAR FUSION Left 01/05/2014   Procedure: LUMBAR TWO TO THREE, LUMBAR LUMBAR THREE TO FOUR, ANTERIOR LATERAL LUMBAR FUSION 2 LEVELS;  Surgeon: 01/07/2014, MD;  [facility];  Service: Neurosurgery;  Laterality: Left;   APPENDECTOMY  1963   BIOPSY  11/25/2018   Procedure: BIOPSY;  Surgeon: 11/27/2018, MD;  [facility];  Service: Gastroenterology;;   BREAST SURGERY Left 1987   bx  COLONOSCOPY W/ POLYPECTOMY     ESOPHAGOGASTRODUODENOSCOPY (EGD) WITH PROPOFOL N/A 11/25/2018   Procedure: ESOPHAGOGASTRODUODENOSCOPY (EGD) WITH PROPOFOL;  Surgeon: Sherrilyn Rist, MD;  [facility];  Service: Gastroenterology;  Laterality: N/A;   EYE SURGERY Bilateral    FEMORAL-POPLITEAL BYPASS GRAFT Bilateral 08/06/2018   Procedure: BILATERAL POPLITEAL AND TIBIAL EMBOLECTOMIES, LEFT LEG FASCIOTOMY;  Surgeon: Larina Earthly, MD;  [facility];  Service: Vascular;  Laterality: Bilateral;   LAPAROSCOPY N/A 07/30/2018   Procedure: DIAGNOSTIC LAPAROSCOPY, Omentopexy gram patch, Washout of intra-abdominal abcesses x 3;  Surgeon: Karie Soda, MD;  Location: [facility];  Service: General;  Laterality: N/A;   OOPHORECTOMY Left 1977   Planters wart Left 2005   radial keratoto Bilateral    TONSILLECTOMY  1958   TOTAL HIP ARTHROPLASTY Left 07/19/2014   Procedure: TOTAL HIP ARTHROPLASTY;  Surgeon: Gean Birchwood, MD;  [facility];  Service: Orthopedics;  Laterality: Left;   TUBAL LIGATION  1986   Family History  Problem Relation Age of Onset   Lung cancer Mother    Stroke Father    Huntington's disease Paternal Grandmother    Huntington's disease Paternal Aunt    Huntington's disease Paternal Uncle    Arthritis Neg Hx        family hx   Cancer Neg Hx        prostate ca -family hx   Heart disease Neg Hx        family hx   Colon cancer Neg Hx    Esophageal cancer Neg Hx    Pancreatic cancer Neg Hx    Liver disease Neg Hx    Social History   Socioeconomic History   Marital status: Married    Spouse name: Not on file   Number of children: Not on file   Years of education: Not on file   Highest education level: Not on file  Occupational History   Occupation: unemployed    Comment: disabled back pain  Tobacco Use   Smoking status: Former    Packs/day: 1.00    Years: 8.00    Total pack years: 8.00    Types: Cigarettes    Quit date: 01/17/1994    Years since quitting: 27.6   Smokeless tobacco: Never  Vaping Use   Vaping Use: Never used  Substance and Sexual Activity   Alcohol use: Yes    Alcohol/week: 3.0 standard drinks of alcohol    Types: 3 Cans of beer per week    Comment: per week   Drug use: No   Sexual activity: Not on file  Other Topics Concern   Not on file  Social History Narrative   Married   Right BKA    Disabled due to back problems       Social Determinants of Health   Financial Resource Strain: Low Risk  (09/21/2021)   Overall Financial Resource Strain (CARDIA)    Difficulty of Paying Living Expenses: Not hard at all  Food Insecurity: No Food Insecurity (09/21/2021)   Hunger Vital  Sign    Worried About Running Out of Food in the Last Year: Never true    Ran Out of Food in the Last Year: Never true  Transportation Needs: No Transportation Needs (09/21/2021)   PRAPARE - Administrator, Civil Service (Medical): No    Lack of Transportation (Non-Medical): No  Physical Activity: Insufficiently Active (09/21/2021)   Exercise Vital Sign    Days of Exercise per Week:  2 days    Minutes of Exercise per Session: 60 min  Stress: No Stress Concern Present (09/21/2021)   [employer] of Occupational Health - Occupational Stress Questionnaire    Feeling of Stress : Not at all  Social Connections: Moderately Integrated (09/21/2021)   Social Connection and Isolation Panel [NHANES]    Frequency of Communication with Friends and Family: More than three times a week    Frequency of Social Gatherings with Friends and Family: More than three times a week    Attends Religious Services: Never    Database administrator or Organizations: Yes    Attends Engineer, structural: More than 4 times per year    Marital Status: Married    Tobacco Counseling Counseling given: Not Answered   Clinical Intake:  Pre-visit preparation completed: No  Pain : No/denies pain     BMI - recorded: 23.86 Nutritional Status: BMI of 19-24  Normal Nutritional Risks: None Diabetes: No  How often do you need to have someone help you when you read instructions, pamphlets, or other written materials from your doctor or pharmacy?: 1 - Never  Diabetic?  No  Interpreter Needed?: No  Information entered by :: Theresa Mulligan LPN   Activities of Daily Living    09/21/2021    2:15 PM  In your present state of health, do you have any difficulty performing the following activities:  Hearing? 1  Comment Wears hearing aids  Vision? 0  Difficulty concentrating or making decisions? 0  Walking or climbing stairs? 1  Comment Uses wheelchair  Dressing or bathing? 1  Comment Husband  assist  Doing errands, shopping? 1  Comment Husband assist  Preparing Food and eating ? Y  Comment Husband assist  Using the Toilet? Y  Comment Husband assist  In the past six months, have you accidently leaked urine? N  Do you have problems with loss of bowel control? N  Managing your Medications? Y  Comment Husband assist  Managing your Finances? Y  Comment Husband assist  Housekeeping or managing your Housekeeping? Y  Comment Husband assist    Patient Care Team: Kristian Covey, MD as PCP - General (Family Medicine) Aliene Beams, MD as Referring Physician (Neurosurgery) Tat, Octaviano Batty, DO as Consulting Physician (Neurology) Karie Soda, MD as Consulting Physician (General Surgery) Wynema Birch, MD as Consulting Physician (Neurology)  Indicate any recent Medical Services you may have received from other than [facility] providers in the past year (date may be approximate).     Assessment:   This is a routine wellness examination for Chelsy.  Hearing/Vision screen Hearing Screening - Comments:: Wears hearing aids Vision Screening - Comments:: No vision difficulty. Followed by Dr Emily Filbert  Dietary issues and exercise activities discussed: Exercise limited by: orthopedic condition(s)   Goals Addressed               This Visit's Progress     DIET - EAT MORE FRUITS AND VEGETABLES (pt-stated)        Eat more fruits to get proper nutrition.       Depression Screen    09/21/2021    2:13 PM 06/14/2021    3:44 PM 09/14/2020    1:17 PM 09/14/2020    1:13 PM 05/27/2019   11:27 AM  PHQ 2/9 Scores  PHQ - 2 Score 0 0 0 0 4  PHQ- 9 Score  3   4    Fall Risk    09/21/2021  2:19 PM 08/01/2021   10:33 AM 09/14/2020    1:16 PM 05/27/2019   11:26 AM 12/13/2017    8:51 AM  Fall Risk   Falls in the past year? 1 1 1 1  Yes  Number falls in past yr: 0 0 1 0 2 or more  Injury with Fall? 0 0 0 0 No  Comment No injury or medical attention needed      Risk Factor Category       High Fall Risk  Risk for fall due to : No Fall Risks No Fall Risks Impaired balance/gait;History of fall(s) Impaired mobility;History of fall(s);Medication side effect   Follow up  Falls evaluation completed Falls evaluation completed Falls evaluation completed;Education provided;Falls prevention discussed Falls evaluation completed    FALL RISK PREVENTION PERTAINING TO THE HOME:  Any stairs in or around the home? Yes  If so, are there any without handrails? No  Home free of loose throw rugs in walkways, pet beds, electrical cords, etc? Yes  Adequate lighting in your home to reduce risk of falls? Yes   ASSISTIVE DEVICES UTILIZED TO PREVENT FALLS:  Life alert? No  Use of a cane, walker or w/c? Yes  Grab bars in the bathroom? Yes Shower chair or bench in shower? Yes  Elevated toilet seat or a handicapped toilet? No   TIMED UP AND GO:  Was the test performed? Yes .  Length of time to ambulate 10 feet: 5 sec.    Wheelchair Device     Cognitive Function:    10/09/2017   10:16 AM  MMSE - Mini Mental State Exam  Orientation to time 5  Orientation to Place 5  Registration 3  Attention/ Calculation 5  Recall 3  Language- name 2 objects 2  Language- repeat 1  Language- follow 3 step command 3  Language- read & follow direction 1  Write a sentence 1  Copy design 0  Total score 29        09/21/2021    2:21 PM 05/27/2019   11:30 AM  6CIT Screen  What Year? 0 points 0 points  What month? 3 points 0 points  What time? 0 points 0 points  Count back from 20 0 points 0 points  Months in reverse 4 points 0 points  Repeat phrase 0 points 0 points  Total Score 7 points 0 points    Immunizations Immunization History  Administered Date(s) Administered   Fluad Quad(high Dose 65+) 12/19/2018, 01/18/2021   HiB (PRP-T) 08/12/2018   Influenza Split 01/31/2011   Influenza, High Dose Seasonal PF 12/17/2016   Influenza,inj,Quad PF,6+ Mos 03/10/2013, 03/23/2014    Influenza-Unspecified 12/01/2015   Meningococcal Mcv4o 08/11/2018   Pneumococcal Conjugate-13 08/11/2018   Pneumococcal Polysaccharide-23 02/07/2006   Td 04/09/2002   Tdap 10/09/2017   Zoster Recombinat (Shingrix) 10/14/2017, 12/22/2017   Zoster, Live 01/31/2011    TDAP status: Up to date  Flu Vaccine status: Up to date  Pneumococcal vaccine status: Up to date  Covid-19 vaccine status: Information provided on how to obtain vaccines.   Qualifies for Shingles Vaccine? Yes   Zostavax completed Yes   Shingrix Completed?: Yes  Screening Tests Health Maintenance  Topic Date Due   MAMMOGRAM  Never done   COLONOSCOPY (Pts 45-65yrs Insurance coverage will need to be confirmed)  01/07/2013   DEXA SCAN  Never done   COVID-19 Vaccine (1) 10/07/2021 (Originally 11/10/1955)   Pneumonia Vaccine 104+ Years old (3 - PPSV23 if available, else PCV20) 09/22/2022 (Originally  08/11/2019)   INFLUENZA VACCINE  11/07/2021   TETANUS/TDAP  10/10/2027   Hepatitis C Screening  Completed   Zoster Vaccines- Shingrix  Completed   HPV VACCINES  Aged Out    Health Maintenance  Health Maintenance Due  Topic Date Due   MAMMOGRAM  Never done   COLONOSCOPY (Pts 45-1734yrs Insurance coverage will need to be confirmed)  01/07/2013   DEXA SCAN  Never done    Colorectal cancer screening: Referral to GI placed 09/21/21. Pt aware the office will call re: appt.    Bone Density status: Ordered 09/21/21. Pt provided with contact info and advised to call to schedule appt.  Lung Cancer Screening: (Low Dose CT Chest recommended if Age 76-80 years, 30 pack-year currently smoking OR have quit w/in 15years.) does not qualify.     Additional Screening:  Hepatitis C Screening: does qualify; Completed 10/09/17  Vision Screening: Recommended annual ophthalmology exams for early detection of glaucoma and other disorders of the eye. Is the patient up to date with their annual eye exam?  Yes  Who is the provider or what is  the name of the office in which the patient attends annual eye exams? Dr Emily FilbertGould If pt is not established with a provider, would they like to be referred to a provider to establish care? No .   Dental Screening: Recommended annual dental exams for proper oral hygiene  Community Resource Referral / Chronic Care Management:   CRR required this visit?  No   CCM required this visit?  No      Plan:     I have personally reviewed and noted the following in the patient's chart:   Medical and social history Use of alcohol, tobacco or illicit drugs  Current medications and supplements including opioid prescriptions.  Functional ability and status Nutritional status Physical activity Advanced directives List of other physicians Hospitalizations, surgeries, and ER visits in previous 12 months Vitals Screenings to include cognitive, depression, and falls Referrals and appointments  In addition, I have reviewed and discussed with patient certain preventive protocols, quality metrics, and best practice recommendations. A written personalized care plan for preventive services as well as general preventive health recommendations were provided to patient.     Tillie RungBeverly W Fotini Lemus, LPN   1/61/09606/15/2023   Nurse Notes: None

## 2021-09-21 NOTE — Addendum Note (Signed)
Addended by: Tillie Rung on: 09/21/2021 04:24 PM   Modules accepted: Orders

## 2021-09-22 NOTE — Telephone Encounter (Signed)
error 

## 2021-09-26 DIAGNOSIS — Z9181 History of falling: Secondary | ICD-10-CM | POA: Diagnosis not present

## 2021-09-26 DIAGNOSIS — R262 Difficulty in walking, not elsewhere classified: Secondary | ICD-10-CM | POA: Diagnosis not present

## 2021-09-26 DIAGNOSIS — R260 Ataxic gait: Secondary | ICD-10-CM | POA: Diagnosis not present

## 2021-09-26 DIAGNOSIS — R296 Repeated falls: Secondary | ICD-10-CM | POA: Diagnosis not present

## 2021-09-27 ENCOUNTER — Ambulatory Visit (INDEPENDENT_AMBULATORY_CARE_PROVIDER_SITE_OTHER): Payer: Medicare Other | Admitting: Family Medicine

## 2021-09-27 ENCOUNTER — Encounter: Payer: Self-pay | Admitting: Family Medicine

## 2021-09-27 VITALS — BP 122/60 | HR 82 | Temp 98.1°F | Ht 64.0 in

## 2021-09-27 DIAGNOSIS — F339 Major depressive disorder, recurrent, unspecified: Secondary | ICD-10-CM

## 2021-09-27 DIAGNOSIS — G10 Huntington's disease: Secondary | ICD-10-CM

## 2021-09-27 DIAGNOSIS — R42 Dizziness and giddiness: Secondary | ICD-10-CM | POA: Diagnosis not present

## 2021-09-27 LAB — COMPREHENSIVE METABOLIC PANEL
ALT: 8 U/L (ref 0–35)
AST: 11 U/L (ref 0–37)
Albumin: 4.3 g/dL (ref 3.5–5.2)
Alkaline Phosphatase: 71 U/L (ref 39–117)
BUN: 13 mg/dL (ref 6–23)
CO2: 25 mEq/L (ref 19–32)
Calcium: 9.5 mg/dL (ref 8.4–10.5)
Chloride: 108 mEq/L (ref 96–112)
Creatinine, Ser: 0.64 mg/dL (ref 0.40–1.20)
GFR: 90.83 mL/min (ref >60.00)
Glucose, Bld: 116 mg/dL — ABNORMAL HIGH (ref 70–99)
Potassium: 3.3 mEq/L — ABNORMAL LOW (ref 3.5–5.1)
Sodium: 141 mEq/L (ref 135–145)
Total Bilirubin: 0.4 mg/dL (ref 0.2–1.2)
Total Protein: 6.7 g/dL (ref 6.0–8.3)

## 2021-09-27 LAB — CBC WITH DIFFERENTIAL/PLATELET
Basophils Absolute: 0.1 10*3/uL (ref 0.0–0.1)
Basophils Relative: 0.9 % (ref 0.0–3.0)
Eosinophils Absolute: 0.1 10*3/uL (ref 0.0–0.7)
Eosinophils Relative: 0.9 % (ref 0.0–5.0)
HCT: 37.5 % (ref 36.0–46.0)
Hemoglobin: 12.2 g/dL (ref 12.0–15.0)
Lymphocytes Relative: 19.9 % (ref 12.0–46.0)
Lymphs Abs: 1.6 10*3/uL (ref 0.7–4.0)
MCHC: 32.5 g/dL (ref 30.0–36.0)
MCV: 101.2 fl — ABNORMAL HIGH (ref 78.0–100.0)
Monocytes Absolute: 0.7 10*3/uL (ref 0.1–1.0)
Monocytes Relative: 8.2 % (ref 3.0–12.0)
Neutro Abs: 5.7 10*3/uL (ref 1.4–7.7)
Neutrophils Relative %: 70.1 % (ref 43.0–77.0)
Platelets: 421 10*3/uL — ABNORMAL HIGH (ref 150.0–400.0)
RBC: 3.71 Mil/uL — ABNORMAL LOW (ref 3.87–5.11)
RDW: 15.5 % (ref 11.5–15.5)
WBC: 8.1 10*3/uL (ref 4.0–10.5)

## 2021-09-27 NOTE — Patient Instructions (Signed)
Try to increase your fluid intake  Let me know in a few days if dizziness not improving.

## 2021-09-27 NOTE — Progress Notes (Signed)
Established Patient Office Visit  Subjective   Patient ID: Natasha Frederick, female    DOB: 1956/02/20  Age: 66 y.o. MRN: [id number]  Chief Complaint  Patient presents with   Dizziness    Patient complains of dizziness, x1 day    HPI   Natasha Frederick is seen with 1 day history of lightheadedness and dizziness.  Onset last night.  Natasha Frederick noticed this mostly when Natasha Frederick was lying down.  Denied any palpitations.  Still has some symptoms today.  Symptoms are somewhat intermittent.  Definitely worse supine.  Natasha Frederick has Huntington's disease and no longer ambulating.  Denies any vertigo.  No chest pains.  No dyspnea.  No fevers or chills.  No dysuria Minimal caffeine intake.  No alcohol use.  Natasha Frederick does states Natasha Frederick is not drinking a lot of fluids during the day.  Natasha Frederick does have past history of perforated gastric ulcer but denies any abdominal pain.  No acute confusion. Natasha Frederick takes oxycodone for severe chronic back pain but takes this only once or twice per day.  Is on Risperdal 0.5 mg twice daily per neurology.  On sertraline 100 mg daily.  Natasha Frederick feels like her mood is relatively stable.  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 difficile diarrhea    Endometriosis    Huntington disease (HCC)    Peptic ulcer    Perforated gastric ulcer (HCC)    Scoliosis    Past Surgical History:  Procedure Laterality Date   AMPUTATION Right 08/08/2018   Procedure: AMPUTATION BELOW KNEE RIGHT LOWER EXTREMITY;  Surgeon: Larina Earthly, MD;  [facility];  Service: Vascular;  Laterality: Right;   ANTERIOR LAT LUMBAR FUSION Left 01/05/2014   Procedure: LUMBAR TWO TO THREE, LUMBAR LUMBAR THREE TO FOUR, ANTERIOR LATERAL LUMBAR FUSION 2 LEVELS;  Surgeon: Reinaldo Meeker, MD;  [facility];  Service: Neurosurgery;  Laterality: Left;   APPENDECTOMY  1963   BIOPSY  11/25/2018   Procedure: BIOPSY;  Surgeon: Sherrilyn Rist,  MD;  [facility];  Service: Gastroenterology;;   BREAST SURGERY Left 1987   bx   COLONOSCOPY W/ POLYPECTOMY     ESOPHAGOGASTRODUODENOSCOPY (EGD) WITH PROPOFOL N/A 11/25/2018   Procedure: ESOPHAGOGASTRODUODENOSCOPY (EGD) WITH PROPOFOL;  Surgeon: Sherrilyn Rist, MD;  [facility];  Service: Gastroenterology;  Laterality: N/A;   EYE SURGERY Bilateral    FEMORAL-POPLITEAL BYPASS GRAFT Bilateral 08/06/2018   Procedure: BILATERAL POPLITEAL AND TIBIAL EMBOLECTOMIES, LEFT LEG FASCIOTOMY;  Surgeon: Larina Earthly, MD;  [facility];  Service: Vascular;  Laterality: Bilateral;   LAPAROSCOPY N/A 07/30/2018   Procedure: DIAGNOSTIC LAPAROSCOPY, Omentopexy gram patch, Washout of intra-abdominal abcesses x 3;  Surgeon: Karie Soda, MD;  [facility];  Service: General;  Laterality: N/A;   OOPHORECTOMY Left 1977   Planters wart Left 2005   radial keratoto Bilateral    TONSILLECTOMY  1958   TOTAL HIP ARTHROPLASTY Left 07/19/2014   Procedure: TOTAL HIP ARTHROPLASTY;  Surgeon: Gean Birchwood, MD;  [facility];  Service: Orthopedics;  Laterality: Left;   TUBAL LIGATION  1986    reports that Natasha Frederick quit smoking about 27 years ago. Her smoking use included cigarettes. Natasha Frederick has a 8.00 pack-year smoking history. Natasha Frederick has never used smokeless tobacco. Natasha Frederick reports current alcohol use of about 3.0 standard drinks of alcohol per week.  Natasha Frederick reports that Natasha Frederick does not use drugs. family history includes Huntington's disease in her paternal aunt, paternal grandmother, and paternal uncle; Lung cancer in her mother; Stroke in her father. Allergies  Allergen Reactions   Bee Venom Anaphylaxis and Swelling   Nsaids Other (See Comments)    PERFORATED ULCER = NO MORE NSAIDs   Adhesive [Tape] Other (See Comments)    redness   Codeine Nausea And Vomiting   Penicillins     Did it involve swelling of the face/tongue/throat, SOB, or low BP? Yes Did it involve sudden or severe rash/hives, skin peeling, or  any reaction on the inside of your mouth or nose? Unknown Did you need to seek medical attention at a hospital or doctor's office? Yes When did it last happen?   age 66-40    If all above answers are "NO", may proceed with cephalosporin use.    Sulfa Antibiotics Itching    Review of Systems  Constitutional:  Negative for chills and fever.  Respiratory:  Negative for cough and shortness of breath.   Cardiovascular:  Negative for chest pain.  Gastrointestinal:  Negative for abdominal pain, nausea and vomiting.  Genitourinary:  Negative for dysuria.  Musculoskeletal:  Positive for back pain.  Neurological:  Positive for dizziness. Negative for focal weakness, seizures, loss of consciousness and headaches.      Objective:     BP 122/60 (BP Location: Left Arm, Patient Position: Sitting, Cuff Size: Normal)   Pulse 82   Temp 98.1 F (36.7 C) (Oral)   Ht 5\' 4"  (1.626 m)   SpO2 98%   BMI 24.89 kg/m  BP Readings from Last 3 Encounters:  09/27/21 122/60  09/21/21 118/60  08/01/21 (!) 150/82   Wt Readings from Last 3 Encounters:  09/21/21 145 lb (65.8 kg)  08/01/21 139 lb 1.6 oz (63.1 kg)  07/03/21 139 lb 1.6 oz (63.1 kg)      Physical Exam Vitals reviewed.  HENT:     Mouth/Throat:     Comments: Oropharynx is slightly dry otherwise clear. Cardiovascular:     Rate and Rhythm: Normal rate and regular rhythm.  Pulmonary:     Effort: Pulmonary effort is normal.     Breath sounds: Normal breath sounds. No wheezing or rales.  Musculoskeletal:     Left lower leg: No edema.     Comments: Prosthetic right leg  Neurological:     Mental Status: Natasha Frederick is alert.     Comments: Natasha Frederick has good grip strength bilaterally.      No results found for any visits on 09/27/21.  Last CBC Lab Results  Component Value Date   WBC 8.9 06/20/2021   HGB 12.6 06/20/2021   HCT 37.3 06/20/2021   MCV 97.8 06/20/2021   MCH 28.0 08/13/2018   RDW 14.0 06/20/2021   PLT 416.0 (H) 06/20/2021   Last  metabolic panel Lab Results  Component Value Date   GLUCOSE 93 08/13/2018   NA 136 08/13/2018   K 3.3 (L) 08/13/2018   CL 103 08/13/2018   CO2 23 08/13/2018   BUN 5 (L) 08/13/2018   CREATININE 0.50 07/14/2021   GFRNONAA >60 08/13/2018   CALCIUM 8.1 (L) 08/13/2018   PROT 5.8 (L) 07/30/2018   ALBUMIN 3.2 (L) 07/30/2018   BILITOT 0.8 07/30/2018   ALKPHOS 52 07/30/2018   AST 44 (H) 07/30/2018   ALT 23 07/30/2018   ANIONGAP 10 08/13/2018   Last thyroid functions Lab Results  Component Value Date  TSH 2.52 10/09/2017      The ASCVD Risk score (Arnett DK, et al., 2019) failed to calculate for the following reasons:   Cannot find a previous HDL lab   Cannot find a previous total cholesterol lab    Assessment & Plan:   #1 dizziness.  Etiology unclear.  Natasha Frederick describes lightheadedness.  Natasha Frederick is no longer ambulatory.  Symptoms actually seem worse when Natasha Frederick is supine which would not seem to go along with any orthostatic type symptoms.  Natasha Frederick does appear to be slightly volume contracted perhaps.  Cardiac exam unremarkable.  -Try to increase fluid intake -Check labs with CBC and CMP -Consider possible cardiac event monitor if symptoms not improving next few days as her symptoms are intermittent  #2 Huntington's disease.  Symptoms are gradually progressing.  Natasha Frederick has had some recent dysphagia and is having to be more careful with food choices.  Continue close follow-up with neurology  #3 history of recurrent depression.  Currently stable on sertraline 100 mg daily.   No follow-ups on file.    Evelena Peat, MD

## 2021-09-28 DIAGNOSIS — R260 Ataxic gait: Secondary | ICD-10-CM | POA: Diagnosis not present

## 2021-09-28 DIAGNOSIS — R296 Repeated falls: Secondary | ICD-10-CM | POA: Diagnosis not present

## 2021-09-28 DIAGNOSIS — R262 Difficulty in walking, not elsewhere classified: Secondary | ICD-10-CM | POA: Diagnosis not present

## 2021-09-28 DIAGNOSIS — Z9181 History of falling: Secondary | ICD-10-CM | POA: Diagnosis not present

## 2021-10-03 ENCOUNTER — Ambulatory Visit (INDEPENDENT_AMBULATORY_CARE_PROVIDER_SITE_OTHER): Payer: Medicare Other | Admitting: Family Medicine

## 2021-10-03 ENCOUNTER — Encounter: Payer: Self-pay | Admitting: Family Medicine

## 2021-10-03 VITALS — BP 122/68 | HR 100 | Temp 98.2°F | Ht 64.0 in

## 2021-10-03 DIAGNOSIS — H109 Unspecified conjunctivitis: Secondary | ICD-10-CM

## 2021-10-03 DIAGNOSIS — R262 Difficulty in walking, not elsewhere classified: Secondary | ICD-10-CM | POA: Diagnosis not present

## 2021-10-03 DIAGNOSIS — B9689 Other specified bacterial agents as the cause of diseases classified elsewhere: Secondary | ICD-10-CM | POA: Diagnosis not present

## 2021-10-03 DIAGNOSIS — R296 Repeated falls: Secondary | ICD-10-CM | POA: Diagnosis not present

## 2021-10-03 DIAGNOSIS — Z9181 History of falling: Secondary | ICD-10-CM | POA: Diagnosis not present

## 2021-10-03 DIAGNOSIS — R260 Ataxic gait: Secondary | ICD-10-CM | POA: Diagnosis not present

## 2021-10-03 MED ORDER — TOBRAMYCIN 0.3 % OP SOLN: 2.0000 [drp] | OPHTHALMIC | 0 refills | Status: DC

## 2021-10-05 DIAGNOSIS — R260 Ataxic gait: Secondary | ICD-10-CM | POA: Diagnosis not present

## 2021-10-05 DIAGNOSIS — R262 Difficulty in walking, not elsewhere classified: Secondary | ICD-10-CM | POA: Diagnosis not present

## 2021-10-05 DIAGNOSIS — R296 Repeated falls: Secondary | ICD-10-CM | POA: Diagnosis not present

## 2021-10-05 DIAGNOSIS — Z9181 History of falling: Secondary | ICD-10-CM | POA: Diagnosis not present

## 2021-10-08 ENCOUNTER — Other Ambulatory Visit: Payer: Self-pay | Admitting: Family Medicine

## 2021-10-11 ENCOUNTER — Telehealth: Payer: Self-pay | Admitting: Family Medicine

## 2021-10-11 ENCOUNTER — Other Ambulatory Visit: Payer: Self-pay | Admitting: Family Medicine

## 2021-10-11 NOTE — Telephone Encounter (Signed)
Pt was seen on 10-03-2021 and her eyes are better and husband would like another refill tobramycin (TOBREX) 0.3 % ophthalmic solution  to clear her eyes up  complete  [id number] - [place], [place] - [address] Phone:  [phone]  Fax:  [phone]

## 2021-10-12 DIAGNOSIS — R260 Ataxic gait: Secondary | ICD-10-CM | POA: Diagnosis not present

## 2021-10-12 DIAGNOSIS — Z9181 History of falling: Secondary | ICD-10-CM | POA: Diagnosis not present

## 2021-10-12 DIAGNOSIS — R296 Repeated falls: Secondary | ICD-10-CM | POA: Diagnosis not present

## 2021-10-12 DIAGNOSIS — R262 Difficulty in walking, not elsewhere classified: Secondary | ICD-10-CM | POA: Diagnosis not present

## 2021-10-12 MED ORDER — TOBRAMYCIN 0.3 % OP SOLN: 2.0000 [drp] | OPHTHALMIC | 0 refills | Status: DC

## 2021-10-12 NOTE — Telephone Encounter (Signed)
Rx done and I left a detailed message at the patient's home number stating the Rx was sent to [facility].

## 2021-10-17 DIAGNOSIS — R296 Repeated falls: Secondary | ICD-10-CM | POA: Diagnosis not present

## 2021-10-17 DIAGNOSIS — Z9181 History of falling: Secondary | ICD-10-CM | POA: Diagnosis not present

## 2021-10-17 DIAGNOSIS — R262 Difficulty in walking, not elsewhere classified: Secondary | ICD-10-CM | POA: Diagnosis not present

## 2021-10-17 DIAGNOSIS — R260 Ataxic gait: Secondary | ICD-10-CM | POA: Diagnosis not present

## 2021-10-24 DIAGNOSIS — Z9181 History of falling: Secondary | ICD-10-CM | POA: Diagnosis not present

## 2021-10-24 DIAGNOSIS — R296 Repeated falls: Secondary | ICD-10-CM | POA: Diagnosis not present

## 2021-10-24 DIAGNOSIS — R262 Difficulty in walking, not elsewhere classified: Secondary | ICD-10-CM | POA: Diagnosis not present

## 2021-10-24 DIAGNOSIS — R260 Ataxic gait: Secondary | ICD-10-CM | POA: Diagnosis not present

## 2021-10-26 ENCOUNTER — Other Ambulatory Visit: Payer: Self-pay | Admitting: Family Medicine

## 2021-10-26 DIAGNOSIS — R260 Ataxic gait: Secondary | ICD-10-CM | POA: Diagnosis not present

## 2021-10-26 DIAGNOSIS — R296 Repeated falls: Secondary | ICD-10-CM | POA: Diagnosis not present

## 2021-10-26 DIAGNOSIS — R262 Difficulty in walking, not elsewhere classified: Secondary | ICD-10-CM | POA: Diagnosis not present

## 2021-10-26 DIAGNOSIS — Z9181 History of falling: Secondary | ICD-10-CM | POA: Diagnosis not present

## 2021-10-27 ENCOUNTER — Telehealth: Payer: Self-pay | Admitting: Family Medicine

## 2021-10-27 DIAGNOSIS — S88111A Complete traumatic amputation at level between knee and ankle, right lower leg, initial encounter: Secondary | ICD-10-CM

## 2021-10-27 NOTE — Telephone Encounter (Signed)
Orders faxed to [facility]

## 2021-10-27 NOTE — Telephone Encounter (Signed)
Pt husband is calling and needs a order for wheel chair repair fax to [facility] fax number is [phone] and phone number is [phone]. Pt has an appt on Monday 10-30-2021

## 2021-10-30 ENCOUNTER — Ambulatory Visit: Payer: Medicare Other | Admitting: Family Medicine

## 2021-10-31 DIAGNOSIS — R296 Repeated falls: Secondary | ICD-10-CM | POA: Diagnosis not present

## 2021-10-31 DIAGNOSIS — R262 Difficulty in walking, not elsewhere classified: Secondary | ICD-10-CM | POA: Diagnosis not present

## 2021-10-31 DIAGNOSIS — Z9181 History of falling: Secondary | ICD-10-CM | POA: Diagnosis not present

## 2021-10-31 DIAGNOSIS — R260 Ataxic gait: Secondary | ICD-10-CM | POA: Diagnosis not present

## 2021-11-02 DIAGNOSIS — R296 Repeated falls: Secondary | ICD-10-CM | POA: Diagnosis not present

## 2021-11-02 DIAGNOSIS — R260 Ataxic gait: Secondary | ICD-10-CM | POA: Diagnosis not present

## 2021-11-02 DIAGNOSIS — Z9181 History of falling: Secondary | ICD-10-CM | POA: Diagnosis not present

## 2021-11-02 DIAGNOSIS — R262 Difficulty in walking, not elsewhere classified: Secondary | ICD-10-CM | POA: Diagnosis not present

## 2021-11-06 ENCOUNTER — Ambulatory Visit (INDEPENDENT_AMBULATORY_CARE_PROVIDER_SITE_OTHER): Payer: Medicare Other | Admitting: Family Medicine

## 2021-11-06 ENCOUNTER — Encounter: Payer: Self-pay | Admitting: Family Medicine

## 2021-11-06 VITALS — BP 120/70 | HR 85 | Temp 98.3°F | Ht 64.0 in | Wt 145.0 lb

## 2021-11-06 DIAGNOSIS — H109 Unspecified conjunctivitis: Secondary | ICD-10-CM

## 2021-11-06 MED ORDER — POLYMYXIN B-TRIMETHOPRIM 10000-0.1 UNIT/ML-% OP SOLN
2.0000 [drp] | OPHTHALMIC | 1 refills | Status: DC
Start: 2021-11-06 — End: 2021-11-13

## 2021-11-06 NOTE — Progress Notes (Signed)
Established Patient Office Visit  Subjective   Patient ID: Natasha Frederick, female    DOB: 01-Jan-1956  Age: 66 y.o. MRN: [id number]  Chief Complaint  Patient presents with   Conjunctivitis    Patient complains of conjunctivitis,     HPI   Patient here with bilateral conjunctivitis symptoms.  She had some erythema and drainage and crusting of both eyes.  She was recently placed on Tobrex eyedrops and states that symptoms did clear for the most part.  They seem to have come back promptly again.  No visual changes.  No eye pain.  No contact use.  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 difficile diarrhea    Endometriosis    Huntington disease (HCC)    Peptic ulcer    Perforated gastric ulcer (HCC)    Scoliosis    Past Surgical History:  Procedure Laterality Date   AMPUTATION Right 08/08/2018   Procedure: AMPUTATION BELOW KNEE RIGHT LOWER EXTREMITY;  Surgeon: Natasha Earthly, MD;  [facility];  Service: Vascular;  Laterality: Right;   ANTERIOR LAT LUMBAR FUSION Left 01/05/2014   Procedure: LUMBAR TWO TO THREE, LUMBAR LUMBAR THREE TO FOUR, ANTERIOR LATERAL LUMBAR FUSION 2 LEVELS;  Surgeon: Natasha Meeker, MD;  Location: [facility];  Service: Neurosurgery;  Laterality: Left;   APPENDECTOMY  1963   BIOPSY  11/25/2018   Procedure: BIOPSY;  Surgeon: Natasha Rist, MD;  Location: [facility];  Service: Gastroenterology;;   BREAST SURGERY Left 1987   bx   COLONOSCOPY W/ POLYPECTOMY     ESOPHAGOGASTRODUODENOSCOPY (EGD) WITH PROPOFOL N/A 11/25/2018   Procedure: ESOPHAGOGASTRODUODENOSCOPY (EGD) WITH PROPOFOL;  Surgeon: Natasha Rist, MD;  [facility]: [facility];  Service: Gastroenterology;  Laterality: N/A;   EYE SURGERY Bilateral    FEMORAL-POPLITEAL BYPASS GRAFT Bilateral 08/06/2018   Procedure: BILATERAL POPLITEAL AND TIBIAL EMBOLECTOMIES, LEFT LEG FASCIOTOMY;  Surgeon:  Natasha Earthly, MD;  [facility];  Service: Vascular;  Laterality: Bilateral;   LAPAROSCOPY N/A 07/30/2018   Procedure: DIAGNOSTIC LAPAROSCOPY, Omentopexy gram patch, Washout of intra-abdominal abcesses x 3;  Surgeon: Natasha Soda, MD;  Location: [facility];  Service: General;  Laterality: N/A;   OOPHORECTOMY Left 1977   Planters wart Left 2005   radial keratoto Bilateral    TONSILLECTOMY  1958   TOTAL HIP ARTHROPLASTY Left 07/19/2014   Procedure: TOTAL HIP ARTHROPLASTY;  Surgeon: Natasha Birchwood, MD;  Location: [facility];  Service: Orthopedics;  Laterality: Left;   TUBAL LIGATION  1986    reports that she quit smoking about 27 years ago. Her smoking use included cigarettes. She has a 8.00 pack-year smoking history. She has never used smokeless tobacco. She reports current alcohol use of about 3.0 standard drinks of alcohol per week. She reports that she does not use drugs. family history includes Huntington's disease in her paternal aunt, paternal grandmother, and paternal uncle; Lung cancer in her mother; Stroke in her father. Allergies  Allergen Reactions   Bee Venom Anaphylaxis and Swelling   Nsaids Other (See Comments)    PERFORATED ULCER = NO MORE NSAIDs   Adhesive [Tape] Other (See Comments)    redness   Codeine Nausea And Vomiting   Penicillins     Did it involve swelling of the face/tongue/throat, SOB, or low BP? Yes Did it involve sudden or severe  rash/hives, skin peeling, or any reaction on the inside of your mouth or nose? Unknown Did you need to seek medical attention at a hospital or doctor's office? Yes When did it last happen?   age 34-40    If all above answers are "NO", may proceed with cephalosporin use.    Sulfa Antibiotics Itching    Review of Systems  Constitutional:  Negative for chills and fever.  Eyes:  Positive for discharge and redness. Negative for blurred vision and pain.      Objective:     BP 120/70 (BP Location: Left Arm, Patient Position: Sitting, Cuff  Size: Normal)   Pulse 85   Temp 98.3 F (36.8 C) (Oral)   Ht 5\' 4"  (1.626 m)   Wt 145 lb (65.8 kg)   SpO2 96%   BMI 24.89 kg/m    Physical Exam Vitals reviewed.  Eyes:     Comments: She has mild erythema both conjunctive a.  She does have some slight crusted drainage on both upper and lower lids bilaterally.  Cardiovascular:     Rate and Rhythm: Normal rate and regular rhythm.  Pulmonary:     Effort: Pulmonary effort is normal.     Breath sounds: Normal breath sounds.  Neurological:     Mental Status: She is alert.      No results found for any visits on 11/06/21.    The ASCVD Risk score (Arnett DK, et al., 2019) failed to calculate for the following reasons:   Cannot find a previous HDL lab   Cannot find a previous total cholesterol lab    Assessment & Plan:   Problem List Items Addressed This Visit   None Visit Diagnoses     Bacterial conjunctivitis    -  Primary     Patient relates some persistent eye symptoms.  Did seem to improve briefly on recent antibiotics.  She does have fairly high heavy make-up around both eyelids and we recommended leaving that off for now  -Polytrim ophthalmic eyedrops 2 drops each eye every 4 hours while awake  -If symptoms not fully clearing within the next week recommend ophthalmology referral  No follow-ups on file.    2020, MD

## 2021-11-07 ENCOUNTER — Ambulatory Visit
Admission: RE | Admit: 2021-11-07 | Discharge: 2021-11-07 | Disposition: A | Discharge status: Home / Self Care | Payer: Medicare Other | Source: Ambulatory Visit | Attending: Internal Medicine | Admitting: Internal Medicine

## 2021-11-07 DIAGNOSIS — M810 Age-related osteoporosis without current pathological fracture: Secondary | ICD-10-CM | POA: Diagnosis not present

## 2021-11-07 DIAGNOSIS — Z13820 Encounter for screening for osteoporosis: Secondary | ICD-10-CM

## 2021-11-07 DIAGNOSIS — R262 Difficulty in walking, not elsewhere classified: Secondary | ICD-10-CM | POA: Diagnosis not present

## 2021-11-07 DIAGNOSIS — Z9181 History of falling: Secondary | ICD-10-CM | POA: Diagnosis not present

## 2021-11-07 DIAGNOSIS — R296 Repeated falls: Secondary | ICD-10-CM | POA: Diagnosis not present

## 2021-11-07 DIAGNOSIS — R260 Ataxic gait: Secondary | ICD-10-CM | POA: Diagnosis not present

## 2021-11-07 DIAGNOSIS — Z780 Asymptomatic menopausal state: Secondary | ICD-10-CM | POA: Diagnosis not present

## 2021-11-09 DIAGNOSIS — R260 Ataxic gait: Secondary | ICD-10-CM | POA: Diagnosis not present

## 2021-11-09 DIAGNOSIS — R296 Repeated falls: Secondary | ICD-10-CM | POA: Diagnosis not present

## 2021-11-09 DIAGNOSIS — Z9181 History of falling: Secondary | ICD-10-CM | POA: Diagnosis not present

## 2021-11-09 DIAGNOSIS — R262 Difficulty in walking, not elsewhere classified: Secondary | ICD-10-CM | POA: Diagnosis not present

## 2021-11-10 ENCOUNTER — Telehealth: Payer: Self-pay | Admitting: Family Medicine

## 2021-11-10 ENCOUNTER — Other Ambulatory Visit: Payer: Self-pay | Admitting: Family Medicine

## 2021-11-10 NOTE — Telephone Encounter (Signed)
Pt walked in needing to speak w/ Dr. I asked what was it consisting of and pt stated that she needed a refill on trimethoprim-polymyxin b (POLYTRIM) ophthalmic solution. To be sent to  [facility] #[id number] - [place], [place] - [address] Phone:  [phone]  Fax:  [phone]     Please advise.

## 2021-11-10 NOTE — Telephone Encounter (Signed)
Pt saw provider on 11/06/21, calling with update, Right eye is better,left is still experiencing symptoms. If you need to call them call on cell phone

## 2021-11-10 NOTE — Telephone Encounter (Signed)
Pt's husband called, wanting to know if MD will call him back before the end of day?  Please advise. [phone]

## 2021-11-10 NOTE — Telephone Encounter (Signed)
See Below:

## 2021-11-13 MED ORDER — POLYMYXIN B-TRIMETHOPRIM 10000-0.1 UNIT/ML-% OP SOLN
2.0000 [drp] | OPHTHALMIC | 1 refills | Status: DC
Start: 2021-11-13 — End: 2022-01-10

## 2021-11-13 NOTE — Telephone Encounter (Signed)
Rx sent 

## 2021-11-13 NOTE — Addendum Note (Signed)
Addended by: Christy Sartorius on: 11/13/2021 07:55 AM   Modules accepted: Orders

## 2021-11-14 ENCOUNTER — Ambulatory Visit (INDEPENDENT_AMBULATORY_CARE_PROVIDER_SITE_OTHER): Payer: Medicare Other | Admitting: Family Medicine

## 2021-11-14 ENCOUNTER — Encounter: Payer: Self-pay | Admitting: Family Medicine

## 2021-11-14 VITALS — BP 136/86 | HR 70 | Temp 98.3°F | Ht 64.0 in

## 2021-11-14 DIAGNOSIS — R260 Ataxic gait: Secondary | ICD-10-CM | POA: Diagnosis not present

## 2021-11-14 DIAGNOSIS — R262 Difficulty in walking, not elsewhere classified: Secondary | ICD-10-CM | POA: Diagnosis not present

## 2021-11-14 DIAGNOSIS — H5789 Other specified disorders of eye and adnexa: Secondary | ICD-10-CM

## 2021-11-14 DIAGNOSIS — R296 Repeated falls: Secondary | ICD-10-CM | POA: Diagnosis not present

## 2021-11-14 DIAGNOSIS — Z9181 History of falling: Secondary | ICD-10-CM | POA: Diagnosis not present

## 2021-11-14 MED ORDER — GENTAMICIN SULFATE 0.3 % OP SOLN: 2.0000 [drp] | OPHTHALMIC | 0 refills | Status: DC

## 2021-11-14 NOTE — Progress Notes (Signed)
Established Patient Office Visit  Subjective   Patient ID: Natasha Frederick, female    DOB: 1955-07-08  Age: 66 y.o. MRN: [id number]  Chief Complaint  Patient presents with   Eye Problem   Follow-up    HPI   Patient has had several weeks now of bilateral eye drainage and intermittent matting.  She was placed initially on Tobrex and then subsequently Polytrim eyedrops.  Seem to see some mild improvement initially.  No visual changes.  She has some itching in both eyes.  No significant pain.  No contact use.  She has some seborrheic dermatitis involving the forehead and has had involvement of the face previously.  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 difficile diarrhea    Endometriosis    Huntington disease (HCC)    Peptic ulcer    Perforated gastric ulcer (HCC)    Scoliosis    Past Surgical History:  Procedure Laterality Date   AMPUTATION Right 08/08/2018   Procedure: AMPUTATION BELOW KNEE RIGHT LOWER EXTREMITY;  Surgeon: Larina Earthly, MD;  [facility];  Service: Vascular;  Laterality: Right;   ANTERIOR LAT LUMBAR FUSION Left 01/05/2014   Procedure: LUMBAR TWO TO THREE, LUMBAR LUMBAR THREE TO FOUR, ANTERIOR LATERAL LUMBAR FUSION 2 LEVELS;  Surgeon: Reinaldo Meeker, MD;  [facility];  Service: Neurosurgery;  Laterality: Left;   APPENDECTOMY  1963   BIOPSY  11/25/2018   Procedure: BIOPSY;  Surgeon: Sherrilyn Rist, MD;  [facility];  Service: Gastroenterology;;   BREAST SURGERY Left 1987   bx   COLONOSCOPY W/ POLYPECTOMY     ESOPHAGOGASTRODUODENOSCOPY (EGD) WITH PROPOFOL N/A 11/25/2018   Procedure: ESOPHAGOGASTRODUODENOSCOPY (EGD) WITH PROPOFOL;  Surgeon: Sherrilyn Rist, MD;  [facility];  Service: Gastroenterology;  Laterality: N/A;   EYE SURGERY Bilateral    FEMORAL-POPLITEAL BYPASS GRAFT Bilateral 08/06/2018   Procedure: BILATERAL  POPLITEAL AND TIBIAL EMBOLECTOMIES, LEFT LEG FASCIOTOMY;  Surgeon: Larina Earthly, MD;  [facility];  Service: Vascular;  Laterality: Bilateral;   LAPAROSCOPY N/A 07/30/2018   Procedure: DIAGNOSTIC LAPAROSCOPY, Omentopexy gram patch, Washout of intra-abdominal abcesses x 3;  Surgeon: Karie Soda, MD;  Location: [facility];  Service: General;  Laterality: N/A;   OOPHORECTOMY Left 1977   Planters wart Left 2005   radial keratoto Bilateral    TONSILLECTOMY  1958   TOTAL HIP ARTHROPLASTY Left 07/19/2014   Procedure: TOTAL HIP ARTHROPLASTY;  Surgeon: Gean Birchwood, MD;  [facility]: [facility];  Service: Orthopedics;  Laterality: Left;   TUBAL LIGATION  1986    reports that she quit smoking about 27 years ago. Her smoking use included cigarettes. She has a 8.00 pack-year smoking history. She has never used smokeless tobacco. She reports current alcohol use of about 3.0 standard drinks of alcohol per week. She reports that she does not use drugs. family history includes Huntington's disease in her paternal aunt, paternal grandmother, and paternal uncle; Lung cancer in her mother; Stroke in her father. Allergies  Allergen Reactions   Bee Venom Anaphylaxis and Swelling   Nsaids Other (See Comments)    PERFORATED ULCER = NO MORE NSAIDs   Adhesive [Tape] Other (See Comments)    redness   Codeine Nausea And Vomiting   Penicillins     Did it involve swelling of the face/tongue/throat, SOB, or  low BP? Yes Did it involve sudden or severe rash/hives, skin peeling, or any reaction on the inside of your mouth or nose? Unknown Did you need to seek medical attention at a hospital or doctor's office? Yes When did it last happen?   age 39-40    If all above answers are "NO", may proceed with cephalosporin use.    Sulfa Antibiotics Itching    Review of Systems  Constitutional:  Negative for chills and fever.  HENT:  Negative for congestion.   Eyes:  Positive for discharge. Negative for blurred vision.       Objective:     BP 136/86 (BP Location: Left Arm, Patient Position: Sitting, Cuff Size: Normal)   Pulse 70   Temp 98.3 F (36.8 C) (Oral)   Ht 5\' 4"  (1.626 m)   SpO2 97%   BMI 24.89 kg/m    Physical Exam Vitals reviewed.  Eyes:     Comments: Patient has a little bit of yellowish crusting on both lower lids but conjunctive are nonerythematous at this time.  No periocular rash  Cardiovascular:     Rate and Rhythm: Normal rate.  Pulmonary:     Effort: Pulmonary effort is normal.     Breath sounds: Normal breath sounds.  Neurological:     Mental Status: She is alert.      No results found for any visits on 11/14/21.    The ASCVD Risk score (Arnett DK, et al., 2019) failed to calculate for the following reasons:   Cannot find a previous HDL lab   Cannot find a previous total cholesterol lab    Assessment & Plan:   Problem List Items Addressed This Visit   None Visit Diagnoses     Eye drainage    -  Primary   Relevant Orders   Ambulatory referral to Ophthalmology     Patient relates several week history of bilateral eye irritation with drainage.  Question blepharitis.  She has been unresponsive to antibiotics topically.  She does have some thick crusted drainage at times.  -We suggested Johnson's baby shampoo along with soft bristle toothbrush and cleanse gently twice daily -Set up ophthalmology referral -Garamycin eyedrops 2 drops each eye every 4-6 hours for the next week  No follow-ups on file.    2020, MD

## 2021-11-14 NOTE — Patient Instructions (Signed)
Get Johnson's baby shampoo and soft bristle tooth brush and gently brush lid margins couple of times daily  I am setting up referral to ophthalmologist.

## 2021-11-16 DIAGNOSIS — R262 Difficulty in walking, not elsewhere classified: Secondary | ICD-10-CM | POA: Diagnosis not present

## 2021-11-16 DIAGNOSIS — Z9181 History of falling: Secondary | ICD-10-CM | POA: Diagnosis not present

## 2021-11-16 DIAGNOSIS — R260 Ataxic gait: Secondary | ICD-10-CM | POA: Diagnosis not present

## 2021-11-16 DIAGNOSIS — R296 Repeated falls: Secondary | ICD-10-CM | POA: Diagnosis not present

## 2021-11-16 NOTE — Telephone Encounter (Signed)
Disregard

## 2021-11-21 DIAGNOSIS — R262 Difficulty in walking, not elsewhere classified: Secondary | ICD-10-CM | POA: Diagnosis not present

## 2021-11-21 DIAGNOSIS — R296 Repeated falls: Secondary | ICD-10-CM | POA: Diagnosis not present

## 2021-11-21 DIAGNOSIS — R260 Ataxic gait: Secondary | ICD-10-CM | POA: Diagnosis not present

## 2021-11-21 DIAGNOSIS — Z9181 History of falling: Secondary | ICD-10-CM | POA: Diagnosis not present

## 2021-11-22 ENCOUNTER — Other Ambulatory Visit: Payer: Self-pay | Admitting: Family Medicine

## 2021-11-23 ENCOUNTER — Telehealth: Payer: Self-pay | Admitting: Family Medicine

## 2021-11-23 ENCOUNTER — Encounter: Payer: Self-pay | Admitting: Internal Medicine

## 2021-11-23 DIAGNOSIS — M810 Age-related osteoporosis without current pathological fracture: Secondary | ICD-10-CM | POA: Insufficient documentation

## 2021-11-23 DIAGNOSIS — R262 Difficulty in walking, not elsewhere classified: Secondary | ICD-10-CM | POA: Diagnosis not present

## 2021-11-23 DIAGNOSIS — R296 Repeated falls: Secondary | ICD-10-CM | POA: Diagnosis not present

## 2021-11-23 DIAGNOSIS — R260 Ataxic gait: Secondary | ICD-10-CM | POA: Diagnosis not present

## 2021-11-23 DIAGNOSIS — Z9181 History of falling: Secondary | ICD-10-CM | POA: Diagnosis not present

## 2021-11-23 NOTE — Telephone Encounter (Signed)
Pt's spouse called to ask if he can have another referral, because [facility]  is scheduling them way out in [place], and they do not wish to wait that long.    After further research, I see they have another referral in place:  [facility]  [phone]  [place]  Pt's spouse was driving, but will call back for that info, when he is parked.

## 2021-11-24 ENCOUNTER — Other Ambulatory Visit: Payer: Self-pay

## 2021-11-24 ENCOUNTER — Emergency Department (HOSPITAL_COMMUNITY)
Admission: EM | Admit: 2021-11-24 | Discharge: 2021-11-25 | Disposition: A | Discharge status: Home / Self Care | Payer: Medicare Other | Attending: Emergency Medicine | Admitting: Emergency Medicine

## 2021-11-24 ENCOUNTER — Encounter (HOSPITAL_COMMUNITY): Payer: Self-pay | Admitting: Emergency Medicine

## 2021-11-24 DIAGNOSIS — H5789 Other specified disorders of eye and adnexa: Secondary | ICD-10-CM | POA: Diagnosis present

## 2021-11-24 DIAGNOSIS — H1033 Unspecified acute conjunctivitis, bilateral: Secondary | ICD-10-CM | POA: Diagnosis not present

## 2021-11-24 NOTE — ED Provider Triage Note (Signed)
Emergency Medicine Provider Triage Evaluation Note  Natasha Frederick , a 66 y.o. female  was evaluated in triage.  Pt complains of eye pain, swelling, and drainage for the past 2 weeks.  Evaluated by PCP and started on antibiotic eyedrops without improvement.  She denies visual change.  Review of Systems  Positive: As above Negative: As above  Physical Exam  BP (!) 156/102 (BP Location: Right Arm)   Pulse 74   Temp 98.8 F (37.1 C) (Oral)   Resp 20   SpO2 94%  Gen:   Awake, no distress   Resp:  Normal effort  MSK:   Moves extremities without difficulty  Other:  Bilateral conjunctivitis noted.  No purulent drainage  Medical Decision Making  Medically screening exam initiated at 7:42 PM.  Appropriate orders placed.  Militza Devery was informed that the remainder of the evaluation will be completed by another provider, this initial triage assessment does not replace that evaluation, and the importance of remaining in the ED until their evaluation is complete.     Marita Kansas, PA-C 11/24/21 1944

## 2021-11-24 NOTE — ED Triage Notes (Signed)
Patient with eye redness and swelling for the last week.  She has been taking gentamycin drops with no relief of eye pain.  She states that the pain has been getting worse.  Patient with crust around eyes, states no drainage.

## 2021-11-24 NOTE — Telephone Encounter (Addendum)
Patient's husband states pt has an appointment on 11/28/2021 with [facility]

## 2021-11-24 NOTE — Telephone Encounter (Deleted)
Appt with [facility] on 11/28/2021

## 2021-11-24 NOTE — Telephone Encounter (Signed)
ATC but could not leave vm  

## 2021-11-25 DIAGNOSIS — H1033 Unspecified acute conjunctivitis, bilateral: Secondary | ICD-10-CM | POA: Diagnosis not present

## 2021-11-25 MED ORDER — KETOTIFEN FUMARATE 0.025 % OP SOLN: 1.0000 [drp] | Freq: Two times a day (BID) | OPHTHALMIC | 0 refills | Status: DC

## 2021-11-25 MED ORDER — FLUORESCEIN SODIUM 1 MG OP STRP
1.0000 | ORAL_STRIP | Freq: Once | OPHTHALMIC | Status: AC
Start: 2021-11-25 — End: 2021-11-25
  Administered 2021-11-25: 1 via OPHTHALMIC
  Filled 2021-11-25: qty 1

## 2021-11-25 MED ORDER — TETRACAINE HCL 0.5 % OP SOLN
1.0000 [drp] | Freq: Once | OPHTHALMIC | Status: AC
  Administered 2021-11-25: 1 [drp] via OPHTHALMIC
  Filled 2021-11-25: qty 4

## 2021-11-25 NOTE — Discharge Instructions (Addendum)
Apply cool wet compresses to your eyes for soothing. We recommend continuing Gentamicin eye drops until your scheduled ophthalmology follow up. You can use these with Zaditor drops as prescribed. Continue tylenol as needed for pain.

## 2021-11-25 NOTE — ED Notes (Signed)
Pt left prior to receiving discharge paperwork  

## 2021-11-25 NOTE — ED Provider Notes (Signed)
[facility] EMERGENCY DEPARTMENT Provider Note   CSN: [id number] Arrival date & time: 11/24/21  1857     History  Chief Complaint  Patient presents with   Eye Drainage    Natasha Frederick is a 66 y.o. female.  66 y/o female with hx of Huntington's disease, DVT, perforated gastric ulcer presents for eye discomfort and redness x 2 months. Symptoms began on 10/01/21 and have persisted, worsened despite use of Tobramycin, Polytrim, and Gentamycin eye drops prescribed by the patient's PCP. Notes mild drainage without significant eye pain. Denies vision changes, fevers. She does not wear contacts and only uses her husband's glasses from time-to-time. No eye trauma.   The history is provided by the spouse and the patient. No language interpreter was used.       Home Medications Prior to Admission medications   Medication Sig Start Date End Date Taking? Authorizing Provider  ketotifen (ZADITOR) 0.025 % ophthalmic solution Place 1 drop into both eyes 2 (two) times daily. 11/25/21  Yes Antony Madura, PA-C  acetaminophen (TYLENOL) 500 MG tablet Take 1,000 mg by mouth every morning.    [provider]  ciclopirox (LOPROX) 0.77 % cream Apply topically 2 (two) times daily. 04/24/21   Burchette, Elberta Fortis, MD  gentamicin (GARAMYCIN) 0.3 % ophthalmic solution INSTILL 2 DROPS IN BOTH EYES EVERY 4 HOURS 11/23/21   Burchette, Elberta Fortis, MD  risperiDONE (RISPERDAL) 0.5 MG tablet Take 0.5 mg by mouth 2 (two) times daily.    [provider]  sennosides-docusate sodium (SENOKOT-S) 8.6-50 MG tablet Take 1 tablet by mouth daily.    [provider]  sertraline (ZOLOFT) 100 MG tablet TAKE 1 TABLET(100 MG) BY MOUTH DAILY 10/26/21   Burchette, Elberta Fortis, MD  trimethoprim-polymyxin b (POLYTRIM) ophthalmic solution Place 2 drops into both eyes every 4 (four) hours. 11/13/21   Burchette, Elberta Fortis, MD      Allergies    Bee venom, Nsaids, Adhesive [tape], Codeine, Penicillins, and Sulfa  antibiotics    Review of Systems   Review of Systems Ten systems reviewed and are negative for acute change, except as noted in the HPI.    Physical Exam Updated Vital Signs BP (!) 150/107   Pulse 77   Temp 98.8 F (37.1 C) (Oral)   Resp 18   SpO2 94%   Physical Exam Vitals and nursing note reviewed.  Constitutional:      General: She is not in acute distress.    Appearance: She is well-developed. She is not diaphoretic.     Comments: Chronically ill appearing, nontoxic.  HENT:     Head: Normocephalic and atraumatic.  Eyes:     General: No scleral icterus.    Conjunctiva/sclera: Conjunctivae normal.     Comments: Snellen 20/20 OU, 20/50 OS, 20/80 OD. EOMs intact without nystagmus. Direct photophobia noted. Mild b/l proptosis with b/l upper lid blepharitis. No periorbital edema or erythema. Yellow discharge and crusting on lashes. B/l conjunctival injection; conjunctivae appears mildly chemotic (R>L). No uptake on fluorescein staining. No corneal ulcer, abrasion, defect. Negative Seidel's sign. IOP 12 OD and 13 OS both with 95% CI.   Pulmonary:     Effort: Pulmonary effort is normal. No respiratory distress.     Comments: Respirations even and unlabored Musculoskeletal:        General: Normal range of motion.     Cervical back: Normal range of motion.  Skin:    General: Skin is warm and dry.  Coloration: Skin is not pale.     Findings: No erythema or rash.  Neurological:     Mental Status: She is alert and oriented to person, place, and time.     Coordination: Coordination normal.  Psychiatric:        Behavior: Behavior normal.     ED Results / Procedures / Treatments   Labs (all labs ordered are listed, but only abnormal results are displayed) Labs Reviewed - No data to display  EKG None  Radiology No results found.  Procedures Procedures    Medications Ordered in ED Medications  fluorescein ophthalmic strip 1 strip (1 strip Both Eyes Given by Other  11/25/21 0550)  tetracaine (PONTOCAINE) 0.5 % ophthalmic solution 1 drop (1 drop Both Eyes Given by Other 11/25/21 0550)    ED Course/ Medical Decision Making/ A&P                           Medical Decision Making Risk Prescription drug management.   This patient presents to the ED for concern of bilateral eye redness and drainage, this involves an extensive number of treatment options, and is a complaint that carries with it a high risk of complications and morbidity.  The differential diagnosis includes bacterial conjunctivitis vs viral conjunctivitis vs chemical conjunctivitis vs iritis/uveitis vs acute glaucoma   Co morbidities that complicate the patient evaluation  Huntington's disease   Additional history obtained:  Additional history obtained from spouse External records from outside source obtained and reviewed including outpatient PCP visits from 10/03/21, 11/06/21, 11/14/21.   Medicines ordered and prescription drug management:  I ordered medication including pontocaine for eye pain  Reevaluation of the patient after these medicines showed that the patient improved I have reviewed the patients home medicines and have made adjustments as needed   Problem List / ED Course:  No evidence of abrasion or ulcer on fluorescein staining No concern for globe rupture IOP normal r/o acute glaucoma   Reevaluation:  After the interventions noted above, I reevaluated the patient and found that they have :improved   Social Determinants of Health:  Good social support   Dispostion:  After consideration of the diagnostic results and the patients response to treatment, I feel that the patent would benefit from outpatient ophthalmology follow up; has appt scheduled for Tuesday. Will try to add Zaditor in the interim. Return precautions discussed and provided. Patient discharged in stable condition with no unaddressed concerns.          Final Clinical Impression(s) / ED  Diagnoses Final diagnoses:  Acute conjunctivitis of both eyes, unspecified acute conjunctivitis type    Rx / DC Orders ED Discharge Orders          Ordered    ketotifen (ZADITOR) 0.025 % ophthalmic solution  2 times daily        11/25/21 0608              Antony Madura, PA-C 11/25/21 0631    Zadie Rhine, MD 11/25/21 [phone]

## 2021-11-27 ENCOUNTER — Ambulatory Visit (INDEPENDENT_AMBULATORY_CARE_PROVIDER_SITE_OTHER): Payer: Medicare Other | Admitting: Family Medicine

## 2021-11-27 ENCOUNTER — Encounter: Payer: Self-pay | Admitting: Family Medicine

## 2021-11-27 VITALS — BP 126/78 | HR 78 | Temp 98.2°F | Ht 64.0 in

## 2021-11-27 DIAGNOSIS — M810 Age-related osteoporosis without current pathological fracture: Secondary | ICD-10-CM | POA: Diagnosis not present

## 2021-11-27 DIAGNOSIS — K219 Gastro-esophageal reflux disease without esophagitis: Secondary | ICD-10-CM | POA: Diagnosis not present

## 2021-11-27 MED ORDER — CICLOPIROX OLAMINE 0.77 % EX CREA
TOPICAL_CREAM | Freq: Two times a day (BID) | CUTANEOUS | 1 refills | Status: DC
Start: 2021-11-27 — End: 2023-03-27

## 2021-11-27 NOTE — Progress Notes (Signed)
Established Patient Office Visit  Subjective   Patient ID: Natasha Frederick, female    DOB: Oct 14, 1955  Age: 67 y.o. MRN: [id number]  Chief Complaint  Patient presents with   Results   Eye Problem    HPI   Natasha Frederick is here with her husband to discuss recent bone density scan.  She apparently had Medicare wellness visit and bone density study was ordered from that.  She has osteoporosis with T score -3.5.  Cannot have lumbar study because of mobility limitations.  She does have longstanding history of reflux and would probably not be a good candidate for oral bisphosphonate such as Fosamax.  She does not take any regular calcium or vitamin D.  She is out in the sun quite a bit.  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  Does not do any real ambulation at this time.  She continues to have ongoing eye issues with bilateral eye irritation and drainage.  Suspected blepharitis.  Has been on multiple courses of antibiotics including Tobrex and Garamycin without improvement.  They have also tried using Johnson's baby shampoo and soft bristle toothbrush for gentle daily cleansing  Past Medical History:  Diagnosis Date   Acute embolism and thrombosis of deep vein of both distal lower extremities (HCC)    Arthritis    hip   C. difficile diarrhea    Endometriosis    Huntington disease (HCC)    Peptic ulcer    Perforated gastric ulcer (HCC)    Scoliosis    Past Surgical History:  Procedure Laterality Date   AMPUTATION Right 08/08/2018   Procedure: AMPUTATION BELOW KNEE RIGHT LOWER EXTREMITY;  Surgeon: Larina Earthly, MD;  [facility];  Service: Vascular;  Laterality: Right;   ANTERIOR LAT LUMBAR FUSION Left 01/05/2014   Procedure: LUMBAR TWO TO THREE, LUMBAR LUMBAR THREE TO FOUR, ANTERIOR LATERAL LUMBAR FUSION 2 LEVELS;  Surgeon: Reinaldo Meeker, MD;  Location: [facility];  Service: Neurosurgery;  Laterality: Left;   APPENDECTOMY  1963   BIOPSY  11/25/2018    Procedure: BIOPSY;  Surgeon: Sherrilyn Rist, MD;  Location: [facility];  Service: Gastroenterology;;   BREAST SURGERY Left 1987   bx   COLONOSCOPY W/ POLYPECTOMY     ESOPHAGOGASTRODUODENOSCOPY (EGD) WITH PROPOFOL N/A 11/25/2018   Procedure: ESOPHAGOGASTRODUODENOSCOPY (EGD) WITH PROPOFOL;  Surgeon: Sherrilyn Rist, MD;  [facility];  Service: Gastroenterology;  Laterality: N/A;   EYE SURGERY Bilateral    FEMORAL-POPLITEAL BYPASS GRAFT Bilateral 08/06/2018   Procedure: BILATERAL POPLITEAL AND TIBIAL EMBOLECTOMIES, LEFT LEG FASCIOTOMY;  Surgeon: Larina Earthly, MD;  [facility];  Service: Vascular;  Laterality: Bilateral;   LAPAROSCOPY N/A 07/30/2018   Procedure: DIAGNOSTIC LAPAROSCOPY, Omentopexy gram patch, Washout of intra-abdominal abcesses x 3;  Surgeon: Karie Soda, MD;  Location: [facility];  Service: General;  Laterality: N/A;   OOPHORECTOMY Left 1977   Planters wart Left 2005   radial keratoto Bilateral    TONSILLECTOMY  1958   TOTAL HIP ARTHROPLASTY Left 07/19/2014   Procedure: TOTAL HIP ARTHROPLASTY;  Surgeon: Gean Birchwood, MD;  [facility];  Service: Orthopedics;  Laterality: Left;   TUBAL LIGATION  1986    reports that she quit smoking about 27 years ago. Her smoking use included cigarettes. She has a 8.00 pack-year smoking history. She has never used smokeless tobacco. She reports current alcohol use of about 3.0 standard drinks of alcohol per week. She reports that she does  not use drugs. family history includes Huntington's disease in her paternal aunt, paternal grandmother, and paternal uncle; Lung cancer in her mother; Stroke in her father. Allergies  Allergen Reactions   Bee Venom Anaphylaxis and Swelling   Nsaids Other (See Comments)    PERFORATED ULCER = NO MORE NSAIDs   Adhesive [Tape] Other (See Comments)    redness   Codeine Nausea And Vomiting   Penicillins     Did it involve swelling of the face/tongue/throat, SOB, or low BP? Yes Did it  involve sudden or severe rash/hives, skin peeling, or any reaction on the inside of your mouth or nose? Unknown Did you need to seek medical attention at a hospital or doctor's office? Yes When did it last happen?   age [age]    If all above answers are "NO", may proceed with cephalosporin use.    Sulfa Antibiotics Itching    Review of Systems  Constitutional:  Negative for chills and fever.  Eyes:  Positive for pain, discharge and redness.      Objective:     BP 126/78 (BP Location: Left Arm, Patient Position: Sitting, Cuff Size: Normal)   Pulse 78   Temp 98.2 F (36.8 C) (Oral)   Ht 5\' 4"  (1.626 m)   SpO2 97%   BMI 24.89 kg/m    Physical Exam Vitals reviewed.  Constitutional:      Appearance: Normal appearance.  Eyes:     Comments: She has some mild crusting upper and lower lids bilaterally  Cardiovascular:     Rate and Rhythm: Normal rate and regular rhythm.  Pulmonary:     Effort: Pulmonary effort is normal.     Breath sounds: Normal breath sounds.  Neurological:     Mental Status: She is alert.      No results found for any visits on 11/27/21.    The ASCVD Risk score (Arnett DK, et al., 2019) failed to calculate for the following reasons:   Cannot find a previous HDL lab   Cannot find a previous total cholesterol lab    Assessment & Plan:   #1 osteoporosis.  We reviewed her recent DEXA results.  T score -3.5 right hip.  We discussed multiple modes of potential therapy.  She is not a good candidate for oral bisphosphonates because of her history of severe reflux.  We did discuss Prolia as a potential alternative.  However, after much discussion regarding pros and cons of therapy they have decided against initiating any therapy at this point which seems reasonable given her overall condition.  She has Huntington's disease and no longer ambulates which obviously reduces her risk of fracture.  They will let me know if they change their mind Continue daily  vitamin D and calcium  #2 chronic bilateral eye irritation.  Suspect some component of blepharitis.  To have pending follow-up with ophthalmologist tomorrow   #3 Huntington's disease.  She has had some progressive decline especially past few years.  They are aware this is a terminal disease.  They are followed by neurology [facility] at [facility]  No follow-ups on file.    [facility], MD

## 2021-11-28 DIAGNOSIS — R262 Difficulty in walking, not elsewhere classified: Secondary | ICD-10-CM | POA: Diagnosis not present

## 2021-11-28 DIAGNOSIS — R260 Ataxic gait: Secondary | ICD-10-CM | POA: Diagnosis not present

## 2021-11-28 DIAGNOSIS — R296 Repeated falls: Secondary | ICD-10-CM | POA: Diagnosis not present

## 2021-11-28 DIAGNOSIS — H2513 Age-related nuclear cataract, bilateral: Secondary | ICD-10-CM | POA: Diagnosis not present

## 2021-11-28 DIAGNOSIS — H16143 Punctate keratitis, bilateral: Secondary | ICD-10-CM | POA: Diagnosis not present

## 2021-11-28 DIAGNOSIS — H0102A Squamous blepharitis right eye, upper and lower eyelids: Secondary | ICD-10-CM | POA: Diagnosis not present

## 2021-11-28 DIAGNOSIS — H10503 Unspecified blepharoconjunctivitis, bilateral: Secondary | ICD-10-CM | POA: Diagnosis not present

## 2021-11-28 DIAGNOSIS — Z9181 History of falling: Secondary | ICD-10-CM | POA: Diagnosis not present

## 2021-11-28 DIAGNOSIS — H04123 Dry eye syndrome of bilateral lacrimal glands: Secondary | ICD-10-CM | POA: Diagnosis not present

## 2021-11-28 DIAGNOSIS — H0102B Squamous blepharitis left eye, upper and lower eyelids: Secondary | ICD-10-CM | POA: Diagnosis not present

## 2021-11-30 DIAGNOSIS — R260 Ataxic gait: Secondary | ICD-10-CM | POA: Diagnosis not present

## 2021-11-30 DIAGNOSIS — Z9181 History of falling: Secondary | ICD-10-CM | POA: Diagnosis not present

## 2021-11-30 DIAGNOSIS — R296 Repeated falls: Secondary | ICD-10-CM | POA: Diagnosis not present

## 2021-11-30 DIAGNOSIS — R262 Difficulty in walking, not elsewhere classified: Secondary | ICD-10-CM | POA: Diagnosis not present

## 2021-12-05 DIAGNOSIS — R262 Difficulty in walking, not elsewhere classified: Secondary | ICD-10-CM | POA: Diagnosis not present

## 2021-12-05 DIAGNOSIS — R296 Repeated falls: Secondary | ICD-10-CM | POA: Diagnosis not present

## 2021-12-05 DIAGNOSIS — R260 Ataxic gait: Secondary | ICD-10-CM | POA: Diagnosis not present

## 2021-12-05 DIAGNOSIS — Z9181 History of falling: Secondary | ICD-10-CM | POA: Diagnosis not present

## 2021-12-06 DIAGNOSIS — H04123 Dry eye syndrome of bilateral lacrimal glands: Secondary | ICD-10-CM | POA: Diagnosis not present

## 2021-12-06 DIAGNOSIS — H16143 Punctate keratitis, bilateral: Secondary | ICD-10-CM | POA: Diagnosis not present

## 2021-12-06 DIAGNOSIS — H0102B Squamous blepharitis left eye, upper and lower eyelids: Secondary | ICD-10-CM | POA: Diagnosis not present

## 2021-12-06 DIAGNOSIS — H10503 Unspecified blepharoconjunctivitis, bilateral: Secondary | ICD-10-CM | POA: Diagnosis not present

## 2021-12-06 DIAGNOSIS — H2513 Age-related nuclear cataract, bilateral: Secondary | ICD-10-CM | POA: Diagnosis not present

## 2021-12-06 DIAGNOSIS — H0102A Squamous blepharitis right eye, upper and lower eyelids: Secondary | ICD-10-CM | POA: Diagnosis not present

## 2021-12-07 DIAGNOSIS — Z9181 History of falling: Secondary | ICD-10-CM | POA: Diagnosis not present

## 2021-12-07 DIAGNOSIS — R260 Ataxic gait: Secondary | ICD-10-CM | POA: Diagnosis not present

## 2021-12-07 DIAGNOSIS — R262 Difficulty in walking, not elsewhere classified: Secondary | ICD-10-CM | POA: Diagnosis not present

## 2021-12-07 DIAGNOSIS — R296 Repeated falls: Secondary | ICD-10-CM | POA: Diagnosis not present

## 2021-12-13 DIAGNOSIS — R296 Repeated falls: Secondary | ICD-10-CM | POA: Diagnosis not present

## 2021-12-13 DIAGNOSIS — Z9181 History of falling: Secondary | ICD-10-CM | POA: Diagnosis not present

## 2021-12-13 DIAGNOSIS — R260 Ataxic gait: Secondary | ICD-10-CM | POA: Diagnosis not present

## 2021-12-13 DIAGNOSIS — R262 Difficulty in walking, not elsewhere classified: Secondary | ICD-10-CM | POA: Diagnosis not present

## 2021-12-15 ENCOUNTER — Encounter: Payer: Self-pay | Admitting: Internal Medicine

## 2021-12-19 DIAGNOSIS — R262 Difficulty in walking, not elsewhere classified: Secondary | ICD-10-CM | POA: Diagnosis not present

## 2021-12-19 DIAGNOSIS — R260 Ataxic gait: Secondary | ICD-10-CM | POA: Diagnosis not present

## 2021-12-19 DIAGNOSIS — Z9181 History of falling: Secondary | ICD-10-CM | POA: Diagnosis not present

## 2021-12-19 DIAGNOSIS — R296 Repeated falls: Secondary | ICD-10-CM | POA: Diagnosis not present

## 2021-12-21 DIAGNOSIS — R260 Ataxic gait: Secondary | ICD-10-CM | POA: Diagnosis not present

## 2021-12-21 DIAGNOSIS — Z9181 History of falling: Secondary | ICD-10-CM | POA: Diagnosis not present

## 2021-12-21 DIAGNOSIS — R262 Difficulty in walking, not elsewhere classified: Secondary | ICD-10-CM | POA: Diagnosis not present

## 2021-12-21 DIAGNOSIS — R296 Repeated falls: Secondary | ICD-10-CM | POA: Diagnosis not present

## 2021-12-26 DIAGNOSIS — R296 Repeated falls: Secondary | ICD-10-CM | POA: Diagnosis not present

## 2021-12-26 DIAGNOSIS — R262 Difficulty in walking, not elsewhere classified: Secondary | ICD-10-CM | POA: Diagnosis not present

## 2021-12-26 DIAGNOSIS — R260 Ataxic gait: Secondary | ICD-10-CM | POA: Diagnosis not present

## 2021-12-26 DIAGNOSIS — Z9181 History of falling: Secondary | ICD-10-CM | POA: Diagnosis not present

## 2021-12-28 DIAGNOSIS — R260 Ataxic gait: Secondary | ICD-10-CM | POA: Diagnosis not present

## 2021-12-28 DIAGNOSIS — R296 Repeated falls: Secondary | ICD-10-CM | POA: Diagnosis not present

## 2021-12-28 DIAGNOSIS — Z9181 History of falling: Secondary | ICD-10-CM | POA: Diagnosis not present

## 2021-12-28 DIAGNOSIS — R262 Difficulty in walking, not elsewhere classified: Secondary | ICD-10-CM | POA: Diagnosis not present

## 2022-01-02 DIAGNOSIS — R296 Repeated falls: Secondary | ICD-10-CM | POA: Diagnosis not present

## 2022-01-02 DIAGNOSIS — G10 Huntington's disease: Secondary | ICD-10-CM | POA: Diagnosis not present

## 2022-01-03 DIAGNOSIS — H04123 Dry eye syndrome of bilateral lacrimal glands: Secondary | ICD-10-CM | POA: Diagnosis not present

## 2022-01-03 DIAGNOSIS — H01131 Eczematous dermatitis of right upper eyelid: Secondary | ICD-10-CM | POA: Diagnosis not present

## 2022-01-03 DIAGNOSIS — H10503 Unspecified blepharoconjunctivitis, bilateral: Secondary | ICD-10-CM | POA: Diagnosis not present

## 2022-01-03 DIAGNOSIS — H01134 Eczematous dermatitis of left upper eyelid: Secondary | ICD-10-CM | POA: Diagnosis not present

## 2022-01-03 DIAGNOSIS — H16143 Punctate keratitis, bilateral: Secondary | ICD-10-CM | POA: Diagnosis not present

## 2022-01-03 DIAGNOSIS — H2513 Age-related nuclear cataract, bilateral: Secondary | ICD-10-CM | POA: Diagnosis not present

## 2022-01-05 DIAGNOSIS — Z9181 History of falling: Secondary | ICD-10-CM | POA: Diagnosis not present

## 2022-01-05 DIAGNOSIS — R296 Repeated falls: Secondary | ICD-10-CM | POA: Diagnosis not present

## 2022-01-05 DIAGNOSIS — R262 Difficulty in walking, not elsewhere classified: Secondary | ICD-10-CM | POA: Diagnosis not present

## 2022-01-05 DIAGNOSIS — R260 Ataxic gait: Secondary | ICD-10-CM | POA: Diagnosis not present

## 2022-01-09 DIAGNOSIS — R296 Repeated falls: Secondary | ICD-10-CM | POA: Diagnosis not present

## 2022-01-09 DIAGNOSIS — R260 Ataxic gait: Secondary | ICD-10-CM | POA: Diagnosis not present

## 2022-01-09 DIAGNOSIS — R262 Difficulty in walking, not elsewhere classified: Secondary | ICD-10-CM | POA: Diagnosis not present

## 2022-01-09 DIAGNOSIS — Z9181 History of falling: Secondary | ICD-10-CM | POA: Diagnosis not present

## 2022-01-10 ENCOUNTER — Ambulatory Visit (INDEPENDENT_AMBULATORY_CARE_PROVIDER_SITE_OTHER): Payer: Medicare Other | Admitting: Family Medicine

## 2022-01-10 ENCOUNTER — Encounter: Payer: Self-pay | Admitting: Family Medicine

## 2022-01-10 VITALS — BP 128/76 | HR 86 | Temp 98.4°F | Wt 149.0 lb

## 2022-01-10 DIAGNOSIS — S61411A Laceration without foreign body of right hand, initial encounter: Secondary | ICD-10-CM

## 2022-01-10 MED ORDER — DOXYCYCLINE HYCLATE 100 MG PO CAPS
100.0000 mg | ORAL_CAPSULE | Freq: Two times a day (BID) | ORAL | 0 refills | Status: AC
Start: 2022-01-10 — End: 2022-01-20

## 2022-01-10 NOTE — Progress Notes (Signed)
   Subjective:    Patient ID: Natasha Frederick, female    DOB: 05-25-1955, 66 y.o.   MRN: [id number]  HPI Here with her husband to check a wound that she sustained last night when she fell out of bed. She struck the right hand on the bed frame and this caused a laceration. No other injuries. Her last TDaP was in 2019.    Review of Systems  Constitutional: Negative.   Respiratory: Negative.    Cardiovascular: Negative.   Skin:  Positive for wound.       Objective:   Physical Exam Constitutional:      Comments: In a wheelchair   Cardiovascular:     Rate and Rhythm: Normal rate and regular rhythm.     Pulses: Normal pulses.     Heart sounds: Normal heart sounds.  Pulmonary:     Effort: Pulmonary effort is normal.     Breath sounds: Normal breath sounds.  Skin:    Comments: There is a linear laceration over the dorsal surface of the right 2nd MCP joint. This is 1.5 cm long. This extends down through the skin to the underlying muscle but no tendons are visible. She has full ROM of the finger   Neurological:     Mental Status: She is alert.           Assessment & Plan:  Laceration of the right hand. After informed consent was obtained, we cleaned the wound thoroughly with Betadine. LA was achieved using 2% Xylocaine. The wound was closed with 5 sutures of 5-0 Ethilon. She tolerated the procedure well. The wound was then dressed with Neosporin and gauze. She was fitted with a splint from the forearm to the finger tip to keep it in an extended position. She will dress the wound once daily. Return on 14 days to remove the sutures.  Alysia Penna, MD

## 2022-01-11 DIAGNOSIS — R262 Difficulty in walking, not elsewhere classified: Secondary | ICD-10-CM | POA: Diagnosis not present

## 2022-01-11 DIAGNOSIS — R296 Repeated falls: Secondary | ICD-10-CM | POA: Diagnosis not present

## 2022-01-11 DIAGNOSIS — R260 Ataxic gait: Secondary | ICD-10-CM | POA: Diagnosis not present

## 2022-01-11 DIAGNOSIS — Z9181 History of falling: Secondary | ICD-10-CM | POA: Diagnosis not present

## 2022-01-16 DIAGNOSIS — R296 Repeated falls: Secondary | ICD-10-CM | POA: Diagnosis not present

## 2022-01-16 DIAGNOSIS — R262 Difficulty in walking, not elsewhere classified: Secondary | ICD-10-CM | POA: Diagnosis not present

## 2022-01-16 DIAGNOSIS — Z9181 History of falling: Secondary | ICD-10-CM | POA: Diagnosis not present

## 2022-01-16 DIAGNOSIS — R260 Ataxic gait: Secondary | ICD-10-CM | POA: Diagnosis not present

## 2022-01-18 DIAGNOSIS — R260 Ataxic gait: Secondary | ICD-10-CM | POA: Diagnosis not present

## 2022-01-18 DIAGNOSIS — R262 Difficulty in walking, not elsewhere classified: Secondary | ICD-10-CM | POA: Diagnosis not present

## 2022-01-18 DIAGNOSIS — Z9181 History of falling: Secondary | ICD-10-CM | POA: Diagnosis not present

## 2022-01-18 DIAGNOSIS — R296 Repeated falls: Secondary | ICD-10-CM | POA: Diagnosis not present

## 2022-01-23 DIAGNOSIS — Z9181 History of falling: Secondary | ICD-10-CM | POA: Diagnosis not present

## 2022-01-23 DIAGNOSIS — R260 Ataxic gait: Secondary | ICD-10-CM | POA: Diagnosis not present

## 2022-01-23 DIAGNOSIS — R296 Repeated falls: Secondary | ICD-10-CM | POA: Diagnosis not present

## 2022-01-23 DIAGNOSIS — R262 Difficulty in walking, not elsewhere classified: Secondary | ICD-10-CM | POA: Diagnosis not present

## 2022-01-24 ENCOUNTER — Ambulatory Visit (INDEPENDENT_AMBULATORY_CARE_PROVIDER_SITE_OTHER): Payer: Medicare Other | Admitting: Family Medicine

## 2022-01-24 ENCOUNTER — Encounter: Payer: Self-pay | Admitting: Family Medicine

## 2022-01-24 VITALS — BP 130/78 | HR 80 | Temp 97.7°F | Ht 64.0 in | Wt 149.0 lb

## 2022-01-24 DIAGNOSIS — Z4802 Encounter for removal of sutures: Secondary | ICD-10-CM

## 2022-01-24 DIAGNOSIS — S61411D Laceration without foreign body of right hand, subsequent encounter: Secondary | ICD-10-CM | POA: Diagnosis not present

## 2022-01-24 NOTE — Patient Instructions (Signed)
Watch for any progressive redness or drainage  Leave off the Neosporin.

## 2022-01-24 NOTE — Progress Notes (Signed)
Established Patient Office Visit  Subjective   Patient ID: Natasha Frederick, female    DOB: 1955/10/26  Age: 66 y.o. MRN: [id number]  Chief Complaint  Patient presents with   Suture / Staple Removal    HPI   Natasha Frederick is here for suture removal from laceration right hand.  Injury occurred on the fourth.  Sutures were placed here by Dr Sarajane Jews.   She apparently fell out of bed and struck her right hand on the bed frame.  She has Huntington's disease.  Tetanus up-to-date.  5 sutures were placed.  Husband has been use some neosporin daily.  She has had a little bit of mild erythema.  Some itching but no drainage.  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 difficile diarrhea    Endometriosis    Huntington disease ([place])    Peptic ulcer    Perforated gastric ulcer ([place])    Scoliosis    Past Surgical History:  Procedure Laterality Date   AMPUTATION Right 08/08/2018   Procedure: AMPUTATION BELOW KNEE RIGHT LOWER EXTREMITY;  Surgeon: Rosetta Posner, MD;  Location: [place];  Service: Vascular;  Laterality: Right;   ANTERIOR LAT LUMBAR FUSION Left 01/05/2014   Procedure: LUMBAR TWO TO THREE, LUMBAR LUMBAR THREE TO FOUR, ANTERIOR LATERAL LUMBAR FUSION 2 LEVELS;  Surgeon: Faythe Ghee, MD;  [facility];  Service: Neurosurgery;  Laterality: Left;   APPENDECTOMY  1963   BIOPSY  11/25/2018   Procedure: BIOPSY;  Surgeon: Doran Stabler, MD;  [facility];  Service: Gastroenterology;;   BREAST SURGERY Left 1987   bx   COLONOSCOPY W/ POLYPECTOMY     ESOPHAGOGASTRODUODENOSCOPY (EGD) WITH PROPOFOL N/A 11/25/2018   Procedure: ESOPHAGOGASTRODUODENOSCOPY (EGD) WITH PROPOFOL;  Surgeon: Doran Stabler, MD;  [facility];  Service: Gastroenterology;  Laterality: N/A;   EYE SURGERY Bilateral    FEMORAL-POPLITEAL BYPASS GRAFT Bilateral 08/06/2018   Procedure: BILATERAL  POPLITEAL AND TIBIAL EMBOLECTOMIES, LEFT LEG FASCIOTOMY;  Surgeon: Rosetta Posner, MD;  Location: [facility];  Service: Vascular;  Laterality: Bilateral;   LAPAROSCOPY N/A 07/30/2018   Procedure: DIAGNOSTIC LAPAROSCOPY, Omentopexy gram patch, Washout of intra-abdominal abcesses x 3;  Surgeon: Michael Boston, MD;  [facility];  Service: General;  Laterality: N/A;   OOPHORECTOMY Left 1977   Planters wart Left 2005   radial keratoto Bilateral    [place] HIP ARTHROPLASTY Left 07/19/2014   Procedure: TOTAL HIP ARTHROPLASTY;  Surgeon: Frederik Pear, MD;  Location: [place];  Service: Orthopedics;  Laterality: Left;   TUBAL LIGATION  1986    reports that she quit smoking about 28 years ago. Her smoking use included cigarettes. She has a 8.00 pack-year smoking history. She has never used smokeless tobacco. She reports current alcohol use of about 3.0 standard drinks of alcohol per week. She reports that she does not use drugs. family history includes Huntington's disease in her paternal aunt, paternal grandmother, and paternal uncle; Lung cancer in her mother; Stroke in her father. Allergies  Allergen Reactions   Bee Venom Anaphylaxis and Swelling   Nsaids Other (See Comments)    PERFORATED ULCER = NO MORE NSAIDs   Adhesive [Tape] Other (See Comments)    redness   Codeine Nausea And Vomiting   Penicillins     Did it  involve swelling of the face/tongue/throat, SOB, or low BP? Yes Did it involve sudden or severe rash/hives, skin peeling, or any reaction on the inside of your mouth or nose? Unknown Did you need to seek medical attention at a hospital or doctor's office? Yes When did it last happen?   age 53-40    If all above answers are "NO", may proceed with cephalosporin use.    Sulfa Antibiotics Itching    Review of Systems  Constitutional:  Negative for chills and fever.      Objective:     BP 130/78 (BP Location: Left Arm, Patient Position: Sitting, Cuff Size: Normal)    Pulse 80   Temp 97.7 F (36.5 C) (Oral)   Ht 5\' 4"  (1.626 m)   Wt 149 lb (67.6 kg)   SpO2 96%   BMI 25.58 kg/m  BP Readings from Last 3 Encounters:  01/24/22 130/78  01/10/22 128/76  11/27/21 126/78   Wt Readings from Last 3 Encounters:  01/24/22 149 lb (67.6 kg)  01/10/22 149 lb (67.6 kg)  11/06/21 145 lb (65.8 kg)      Physical Exam Vitals reviewed.  Constitutional:      Appearance: Normal appearance.  Cardiovascular:     Rate and Rhythm: Normal rate and regular rhythm.  Skin:    Comments: Right hand reveals laceration over the second MCP joint.  5 sutures in place.  She has some very mild erythema but no drainage.  No warmth.  Nontender.  5 sutures removed without difficulty.  Neurological:     Mental Status: She is alert.      No results found for any visits on 01/24/22.    The ASCVD Risk score (Arnett DK, et al., 2019) failed to calculate for the following reasons:   Cannot find a previous HDL lab   Cannot find a previous total cholesterol lab    Assessment & Plan:   Laceration right hand.  Sutures removed without difficulty.  She has some very mild erythema but doubt cellulitis changes.  She has been using Neosporin daily and has some itching and may be having some mild allergic reaction. -Leave off Neosporin -Watch closely for any progressive erythema, warmth, or other indicators of infection.  No follow-ups on file.    Natasha Littler, MD

## 2022-01-25 DIAGNOSIS — Z9181 History of falling: Secondary | ICD-10-CM | POA: Diagnosis not present

## 2022-01-25 DIAGNOSIS — R296 Repeated falls: Secondary | ICD-10-CM | POA: Diagnosis not present

## 2022-01-25 DIAGNOSIS — R262 Difficulty in walking, not elsewhere classified: Secondary | ICD-10-CM | POA: Diagnosis not present

## 2022-01-25 DIAGNOSIS — R260 Ataxic gait: Secondary | ICD-10-CM | POA: Diagnosis not present

## 2022-01-30 DIAGNOSIS — R262 Difficulty in walking, not elsewhere classified: Secondary | ICD-10-CM | POA: Diagnosis not present

## 2022-01-30 DIAGNOSIS — R260 Ataxic gait: Secondary | ICD-10-CM | POA: Diagnosis not present

## 2022-01-30 DIAGNOSIS — R296 Repeated falls: Secondary | ICD-10-CM | POA: Diagnosis not present

## 2022-01-30 DIAGNOSIS — Z9181 History of falling: Secondary | ICD-10-CM | POA: Diagnosis not present

## 2022-02-01 DIAGNOSIS — R262 Difficulty in walking, not elsewhere classified: Secondary | ICD-10-CM | POA: Diagnosis not present

## 2022-02-01 DIAGNOSIS — R296 Repeated falls: Secondary | ICD-10-CM | POA: Diagnosis not present

## 2022-02-01 DIAGNOSIS — Z9181 History of falling: Secondary | ICD-10-CM | POA: Diagnosis not present

## 2022-02-01 DIAGNOSIS — R260 Ataxic gait: Secondary | ICD-10-CM | POA: Diagnosis not present

## 2022-02-05 DIAGNOSIS — H01131 Eczematous dermatitis of right upper eyelid: Secondary | ICD-10-CM | POA: Diagnosis not present

## 2022-02-05 DIAGNOSIS — H04123 Dry eye syndrome of bilateral lacrimal glands: Secondary | ICD-10-CM | POA: Diagnosis not present

## 2022-02-05 DIAGNOSIS — H01134 Eczematous dermatitis of left upper eyelid: Secondary | ICD-10-CM | POA: Diagnosis not present

## 2022-02-05 DIAGNOSIS — H16143 Punctate keratitis, bilateral: Secondary | ICD-10-CM | POA: Diagnosis not present

## 2022-02-05 DIAGNOSIS — H15111 Episcleritis periodica fugax, right eye: Secondary | ICD-10-CM | POA: Diagnosis not present

## 2022-02-05 DIAGNOSIS — H2513 Age-related nuclear cataract, bilateral: Secondary | ICD-10-CM | POA: Diagnosis not present

## 2022-02-06 DIAGNOSIS — Z9181 History of falling: Secondary | ICD-10-CM | POA: Diagnosis not present

## 2022-02-06 DIAGNOSIS — R296 Repeated falls: Secondary | ICD-10-CM | POA: Diagnosis not present

## 2022-02-06 DIAGNOSIS — R262 Difficulty in walking, not elsewhere classified: Secondary | ICD-10-CM | POA: Diagnosis not present

## 2022-02-06 DIAGNOSIS — R260 Ataxic gait: Secondary | ICD-10-CM | POA: Diagnosis not present

## 2022-02-08 DIAGNOSIS — Z9181 History of falling: Secondary | ICD-10-CM | POA: Diagnosis not present

## 2022-02-08 DIAGNOSIS — R260 Ataxic gait: Secondary | ICD-10-CM | POA: Diagnosis not present

## 2022-02-08 DIAGNOSIS — R262 Difficulty in walking, not elsewhere classified: Secondary | ICD-10-CM | POA: Diagnosis not present

## 2022-02-08 DIAGNOSIS — R296 Repeated falls: Secondary | ICD-10-CM | POA: Diagnosis not present

## 2022-02-12 DIAGNOSIS — R260 Ataxic gait: Secondary | ICD-10-CM | POA: Diagnosis not present

## 2022-02-12 DIAGNOSIS — Z9181 History of falling: Secondary | ICD-10-CM | POA: Diagnosis not present

## 2022-02-12 DIAGNOSIS — R296 Repeated falls: Secondary | ICD-10-CM | POA: Diagnosis not present

## 2022-02-12 DIAGNOSIS — R262 Difficulty in walking, not elsewhere classified: Secondary | ICD-10-CM | POA: Diagnosis not present

## 2022-02-14 DIAGNOSIS — R296 Repeated falls: Secondary | ICD-10-CM | POA: Diagnosis not present

## 2022-02-14 DIAGNOSIS — Z9181 History of falling: Secondary | ICD-10-CM | POA: Diagnosis not present

## 2022-02-14 DIAGNOSIS — R262 Difficulty in walking, not elsewhere classified: Secondary | ICD-10-CM | POA: Diagnosis not present

## 2022-02-14 DIAGNOSIS — R260 Ataxic gait: Secondary | ICD-10-CM | POA: Diagnosis not present

## 2022-02-19 DIAGNOSIS — H16141 Punctate keratitis, right eye: Secondary | ICD-10-CM | POA: Diagnosis not present

## 2022-02-19 DIAGNOSIS — H2513 Age-related nuclear cataract, bilateral: Secondary | ICD-10-CM | POA: Diagnosis not present

## 2022-02-19 DIAGNOSIS — H15111 Episcleritis periodica fugax, right eye: Secondary | ICD-10-CM | POA: Diagnosis not present

## 2022-02-19 DIAGNOSIS — H01134 Eczematous dermatitis of left upper eyelid: Secondary | ICD-10-CM | POA: Diagnosis not present

## 2022-02-19 DIAGNOSIS — H04123 Dry eye syndrome of bilateral lacrimal glands: Secondary | ICD-10-CM | POA: Diagnosis not present

## 2022-02-19 DIAGNOSIS — H01131 Eczematous dermatitis of right upper eyelid: Secondary | ICD-10-CM | POA: Diagnosis not present

## 2022-02-20 DIAGNOSIS — R296 Repeated falls: Secondary | ICD-10-CM | POA: Diagnosis not present

## 2022-02-20 DIAGNOSIS — R262 Difficulty in walking, not elsewhere classified: Secondary | ICD-10-CM | POA: Diagnosis not present

## 2022-02-20 DIAGNOSIS — R260 Ataxic gait: Secondary | ICD-10-CM | POA: Diagnosis not present

## 2022-02-20 DIAGNOSIS — Z9181 History of falling: Secondary | ICD-10-CM | POA: Diagnosis not present

## 2022-02-22 DIAGNOSIS — Z9181 History of falling: Secondary | ICD-10-CM | POA: Diagnosis not present

## 2022-02-22 DIAGNOSIS — R262 Difficulty in walking, not elsewhere classified: Secondary | ICD-10-CM | POA: Diagnosis not present

## 2022-02-22 DIAGNOSIS — R260 Ataxic gait: Secondary | ICD-10-CM | POA: Diagnosis not present

## 2022-02-22 DIAGNOSIS — R296 Repeated falls: Secondary | ICD-10-CM | POA: Diagnosis not present

## 2022-02-26 DIAGNOSIS — R296 Repeated falls: Secondary | ICD-10-CM | POA: Diagnosis not present

## 2022-02-26 DIAGNOSIS — R262 Difficulty in walking, not elsewhere classified: Secondary | ICD-10-CM | POA: Diagnosis not present

## 2022-02-26 DIAGNOSIS — R260 Ataxic gait: Secondary | ICD-10-CM | POA: Diagnosis not present

## 2022-02-26 DIAGNOSIS — Z9181 History of falling: Secondary | ICD-10-CM | POA: Diagnosis not present

## 2022-02-28 DIAGNOSIS — R260 Ataxic gait: Secondary | ICD-10-CM | POA: Diagnosis not present

## 2022-02-28 DIAGNOSIS — Z9181 History of falling: Secondary | ICD-10-CM | POA: Diagnosis not present

## 2022-02-28 DIAGNOSIS — R262 Difficulty in walking, not elsewhere classified: Secondary | ICD-10-CM | POA: Diagnosis not present

## 2022-02-28 DIAGNOSIS — R296 Repeated falls: Secondary | ICD-10-CM | POA: Diagnosis not present

## 2022-03-05 DIAGNOSIS — H15111 Episcleritis periodica fugax, right eye: Secondary | ICD-10-CM | POA: Diagnosis not present

## 2022-03-05 DIAGNOSIS — H01131 Eczematous dermatitis of right upper eyelid: Secondary | ICD-10-CM | POA: Diagnosis not present

## 2022-03-05 DIAGNOSIS — H04123 Dry eye syndrome of bilateral lacrimal glands: Secondary | ICD-10-CM | POA: Diagnosis not present

## 2022-03-05 DIAGNOSIS — H2513 Age-related nuclear cataract, bilateral: Secondary | ICD-10-CM | POA: Diagnosis not present

## 2022-03-05 DIAGNOSIS — H16141 Punctate keratitis, right eye: Secondary | ICD-10-CM | POA: Diagnosis not present

## 2022-03-05 DIAGNOSIS — H01134 Eczematous dermatitis of left upper eyelid: Secondary | ICD-10-CM | POA: Diagnosis not present

## 2022-03-06 DIAGNOSIS — R296 Repeated falls: Secondary | ICD-10-CM | POA: Diagnosis not present

## 2022-03-06 DIAGNOSIS — R262 Difficulty in walking, not elsewhere classified: Secondary | ICD-10-CM | POA: Diagnosis not present

## 2022-03-06 DIAGNOSIS — Z9181 History of falling: Secondary | ICD-10-CM | POA: Diagnosis not present

## 2022-03-06 DIAGNOSIS — R260 Ataxic gait: Secondary | ICD-10-CM | POA: Diagnosis not present

## 2022-03-08 DIAGNOSIS — Z9181 History of falling: Secondary | ICD-10-CM | POA: Diagnosis not present

## 2022-03-08 DIAGNOSIS — R296 Repeated falls: Secondary | ICD-10-CM | POA: Diagnosis not present

## 2022-03-08 DIAGNOSIS — R260 Ataxic gait: Secondary | ICD-10-CM | POA: Diagnosis not present

## 2022-03-08 DIAGNOSIS — R262 Difficulty in walking, not elsewhere classified: Secondary | ICD-10-CM | POA: Diagnosis not present

## 2022-03-13 DIAGNOSIS — R296 Repeated falls: Secondary | ICD-10-CM | POA: Diagnosis not present

## 2022-03-13 DIAGNOSIS — R260 Ataxic gait: Secondary | ICD-10-CM | POA: Diagnosis not present

## 2022-03-13 DIAGNOSIS — Z9181 History of falling: Secondary | ICD-10-CM | POA: Diagnosis not present

## 2022-03-13 DIAGNOSIS — R262 Difficulty in walking, not elsewhere classified: Secondary | ICD-10-CM | POA: Diagnosis not present

## 2022-03-15 DIAGNOSIS — R262 Difficulty in walking, not elsewhere classified: Secondary | ICD-10-CM | POA: Diagnosis not present

## 2022-03-15 DIAGNOSIS — R260 Ataxic gait: Secondary | ICD-10-CM | POA: Diagnosis not present

## 2022-03-15 DIAGNOSIS — R296 Repeated falls: Secondary | ICD-10-CM | POA: Diagnosis not present

## 2022-03-15 DIAGNOSIS — Z9181 History of falling: Secondary | ICD-10-CM | POA: Diagnosis not present

## 2022-03-20 DIAGNOSIS — Z9181 History of falling: Secondary | ICD-10-CM | POA: Diagnosis not present

## 2022-03-20 DIAGNOSIS — R262 Difficulty in walking, not elsewhere classified: Secondary | ICD-10-CM | POA: Diagnosis not present

## 2022-03-20 DIAGNOSIS — R296 Repeated falls: Secondary | ICD-10-CM | POA: Diagnosis not present

## 2022-03-20 DIAGNOSIS — R260 Ataxic gait: Secondary | ICD-10-CM | POA: Diagnosis not present

## 2022-03-22 DIAGNOSIS — Z9181 History of falling: Secondary | ICD-10-CM | POA: Diagnosis not present

## 2022-03-22 DIAGNOSIS — R260 Ataxic gait: Secondary | ICD-10-CM | POA: Diagnosis not present

## 2022-03-22 DIAGNOSIS — R296 Repeated falls: Secondary | ICD-10-CM | POA: Diagnosis not present

## 2022-03-22 DIAGNOSIS — R262 Difficulty in walking, not elsewhere classified: Secondary | ICD-10-CM | POA: Diagnosis not present

## 2022-03-27 ENCOUNTER — Encounter: Payer: Self-pay | Admitting: Family Medicine

## 2022-03-27 ENCOUNTER — Ambulatory Visit (INDEPENDENT_AMBULATORY_CARE_PROVIDER_SITE_OTHER): Payer: Medicare Other | Admitting: Family Medicine

## 2022-03-27 VITALS — BP 130/80 | HR 82 | Temp 98.0°F | Ht 64.0 in

## 2022-03-27 DIAGNOSIS — S60415A Abrasion of left ring finger, initial encounter: Secondary | ICD-10-CM | POA: Diagnosis not present

## 2022-03-27 DIAGNOSIS — M7989 Other specified soft tissue disorders: Secondary | ICD-10-CM | POA: Diagnosis not present

## 2022-03-27 DIAGNOSIS — L03012 Cellulitis of left finger: Secondary | ICD-10-CM | POA: Diagnosis not present

## 2022-03-27 MED ORDER — DOXYCYCLINE HYCLATE 100 MG PO CAPS: 100.0000 mg | ORAL_CAPSULE | Freq: Two times a day (BID) | ORAL | 0 refills | Status: DC

## 2022-03-27 NOTE — Progress Notes (Signed)
Established Patient Office Visit  Subjective   Patient ID: Natasha Frederick, female    DOB: [birth year]  Age: 66 y.o. MRN: [id number]  Chief Complaint  Patient presents with   Laceration    Patient complains of laceration on left ring finger, x1 week     HPI   Patient seen with initial complaint of "laceration "of left ring finger.  On further inspection, no evidence for laceration.  Does look like she has somewhat of an abrasion just proximal to the PIP joint.  She has mild surrounding erythema.  No warmth.  Nontender.  She states she has not remove her wedding bands in years.  They tried to remove her bands without any success.  Did not try any lubricants.  Husband thinks that she may have gotten her hand caught between the wheelchair wheel and door facing causing initial injury.  She has full range of motion of the finger.  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 difficile diarrhea    Endometriosis    Huntington disease (HCC)    Peptic ulcer    Perforated gastric ulcer (HCC)    Scoliosis    Past Surgical History:  Procedure Laterality Date   AMPUTATION Right 08/08/2018   Procedure: AMPUTATION BELOW KNEE RIGHT LOWER EXTREMITY;  Surgeon: Larina Earthly, MD;  [facility];  Service: Vascular;  Laterality: Right;   ANTERIOR LAT LUMBAR FUSION Left 01/05/2014   Procedure: LUMBAR TWO TO THREE, LUMBAR LUMBAR THREE TO FOUR, ANTERIOR LATERAL LUMBAR FUSION 2 LEVELS;  Surgeon: Reinaldo Meeker, MD;  [facility];  Service: Neurosurgery;  Laterality: Left;   APPENDECTOMY  1963   BIOPSY  11/25/2018   Procedure: BIOPSY;  Surgeon: Sherrilyn Rist, MD;  [facility];  Service: Gastroenterology;;   BREAST SURGERY Left 1987   bx   COLONOSCOPY W/ POLYPECTOMY     ESOPHAGOGASTRODUODENOSCOPY (EGD) WITH PROPOFOL N/A 11/25/2018   Procedure: ESOPHAGOGASTRODUODENOSCOPY (EGD)  WITH PROPOFOL;  Surgeon: Sherrilyn Rist, MD;  [facility];  Service: Gastroenterology;  Laterality: N/A;   EYE SURGERY Bilateral    FEMORAL-POPLITEAL BYPASS GRAFT Bilateral 08/06/2018   Procedure: BILATERAL POPLITEAL AND TIBIAL EMBOLECTOMIES, LEFT LEG FASCIOTOMY;  Surgeon: Larina Earthly, MD;  [facility];  Service: Vascular;  Laterality: Bilateral;   LAPAROSCOPY N/A 07/30/2018   Procedure: DIAGNOSTIC LAPAROSCOPY, Omentopexy gram patch, Washout of intra-abdominal abcesses x 3;  Surgeon: Karie Soda, MD;  [facility];  Service: General;  Laterality: N/A;   OOPHORECTOMY Left 1977   Planters wart Left 2005   radial keratoto Bilateral    TONSILLECTOMY  1958   TOTAL HIP ARTHROPLASTY Left 07/19/2014   Procedure: TOTAL HIP ARTHROPLASTY;  Surgeon: Gean Birchwood, MD;  [facility];  Service: Orthopedics;  Laterality: Left;   TUBAL LIGATION  1986    reports that she quit smoking about 28 years ago. Her smoking use included cigarettes. She has a 8.00 pack-year smoking history. She has never used smokeless tobacco. She reports current alcohol use of about 3.0 standard drinks of alcohol per week. She reports that she does not use drugs. family history includes Huntington's disease in her paternal aunt, paternal grandmother, and paternal uncle; Lung cancer in her mother; Stroke in her father. Allergies  Allergen Reactions   Bee Venom Anaphylaxis and Swelling   Nsaids  Other (See Comments)    PERFORATED ULCER = NO MORE NSAIDs   Adhesive [Tape] Other (See Comments)    redness   Codeine Nausea And Vomiting   Penicillins     Did it involve swelling of the face/tongue/throat, SOB, or low BP? Yes Did it involve sudden or severe rash/hives, skin peeling, or any reaction on the inside of your mouth or nose? Unknown Did you need to seek medical attention at a hospital or doctor's office? Yes When did it last happen?   age [age]    If all above answers are "NO", may proceed with  cephalosporin use.    Sulfa Antibiotics Itching    Review of Systems  Constitutional:  Negative for chills and fever.      Objective:     BP 130/80 (BP Location: Left Arm, Patient Position: Sitting, Cuff Size: Normal)   Pulse 82   Temp 98 F (36.7 C) (Oral)   Ht 5\' 4"  (1.626 m)   SpO2 98%   BMI 25.58 kg/m    Physical Exam Vitals reviewed.  Cardiovascular:     Rate and Rhythm: Normal rate and regular rhythm.  Skin:    Comments: Left ring finger reveals small eschar/abrasion just proximal to the PIP joint dorsally.  Mild surrounding erythema.  Rings are fairly tight but not compromising circulation in any way.  She has 2 bands on.  Neurological:     Mental Status: She is alert.      No results found for any visits on 03/27/22.    The ASCVD Risk score (Arnett DK, et al., 2019) failed to calculate for the following reasons:   Cannot find a previous HDL lab   Cannot find a previous total cholesterol lab    Assessment & Plan:   Patient presents with left ring finger small abrasion and possibly very early cellulitis changes.  History of allergy to penicillin.  Her wedding bands are on very tight on the finger but not compromising circulation at this time.  We applied some lubricant to the finger after cold pack to try to reduce some swelling but had difficulty getting this over the PIP joint.  -We recommend she elevate hand at home and consider ice pack for 15 to 20 minutes followed by lubrication and repeat attempt.  If unable to remove at that point may need to look at cutting her bands to get off although again no urgency at this time unless her finger becomes more swollen.  2020, MD

## 2022-03-27 NOTE — Patient Instructions (Signed)
Try soaking left hand in cold water for 15 to 20 minutes to reduce swelling and then apply lubricant and try to remove  If unable to remove with the above band might have to be cut.

## 2022-03-29 DIAGNOSIS — Z9181 History of falling: Secondary | ICD-10-CM | POA: Diagnosis not present

## 2022-03-29 DIAGNOSIS — R260 Ataxic gait: Secondary | ICD-10-CM | POA: Diagnosis not present

## 2022-03-29 DIAGNOSIS — R296 Repeated falls: Secondary | ICD-10-CM | POA: Diagnosis not present

## 2022-03-29 DIAGNOSIS — R262 Difficulty in walking, not elsewhere classified: Secondary | ICD-10-CM | POA: Diagnosis not present

## 2022-04-03 DIAGNOSIS — R296 Repeated falls: Secondary | ICD-10-CM | POA: Diagnosis not present

## 2022-04-03 DIAGNOSIS — R260 Ataxic gait: Secondary | ICD-10-CM | POA: Diagnosis not present

## 2022-04-03 DIAGNOSIS — R262 Difficulty in walking, not elsewhere classified: Secondary | ICD-10-CM | POA: Diagnosis not present

## 2022-04-03 DIAGNOSIS — Z9181 History of falling: Secondary | ICD-10-CM | POA: Diagnosis not present

## 2022-04-05 DIAGNOSIS — R296 Repeated falls: Secondary | ICD-10-CM | POA: Diagnosis not present

## 2022-04-05 DIAGNOSIS — R260 Ataxic gait: Secondary | ICD-10-CM | POA: Diagnosis not present

## 2022-04-05 DIAGNOSIS — R262 Difficulty in walking, not elsewhere classified: Secondary | ICD-10-CM | POA: Diagnosis not present

## 2022-04-05 DIAGNOSIS — Z9181 History of falling: Secondary | ICD-10-CM | POA: Diagnosis not present

## 2022-04-05 DIAGNOSIS — R6889 Other general symptoms and signs: Secondary | ICD-10-CM | POA: Diagnosis not present

## 2022-04-10 DIAGNOSIS — R262 Difficulty in walking, not elsewhere classified: Secondary | ICD-10-CM | POA: Diagnosis not present

## 2022-04-10 DIAGNOSIS — Z9181 History of falling: Secondary | ICD-10-CM | POA: Diagnosis not present

## 2022-04-10 DIAGNOSIS — R260 Ataxic gait: Secondary | ICD-10-CM | POA: Diagnosis not present

## 2022-04-10 DIAGNOSIS — R296 Repeated falls: Secondary | ICD-10-CM | POA: Diagnosis not present

## 2022-04-12 DIAGNOSIS — R262 Difficulty in walking, not elsewhere classified: Secondary | ICD-10-CM | POA: Diagnosis not present

## 2022-04-12 DIAGNOSIS — R260 Ataxic gait: Secondary | ICD-10-CM | POA: Diagnosis not present

## 2022-04-12 DIAGNOSIS — Z9181 History of falling: Secondary | ICD-10-CM | POA: Diagnosis not present

## 2022-04-12 DIAGNOSIS — R296 Repeated falls: Secondary | ICD-10-CM | POA: Diagnosis not present

## 2022-04-17 DIAGNOSIS — R296 Repeated falls: Secondary | ICD-10-CM | POA: Diagnosis not present

## 2022-04-17 DIAGNOSIS — R262 Difficulty in walking, not elsewhere classified: Secondary | ICD-10-CM | POA: Diagnosis not present

## 2022-04-17 DIAGNOSIS — R260 Ataxic gait: Secondary | ICD-10-CM | POA: Diagnosis not present

## 2022-04-17 DIAGNOSIS — Z9181 History of falling: Secondary | ICD-10-CM | POA: Diagnosis not present

## 2022-04-19 DIAGNOSIS — R260 Ataxic gait: Secondary | ICD-10-CM | POA: Diagnosis not present

## 2022-04-19 DIAGNOSIS — Z9181 History of falling: Secondary | ICD-10-CM | POA: Diagnosis not present

## 2022-04-19 DIAGNOSIS — R296 Repeated falls: Secondary | ICD-10-CM | POA: Diagnosis not present

## 2022-04-19 DIAGNOSIS — R262 Difficulty in walking, not elsewhere classified: Secondary | ICD-10-CM | POA: Diagnosis not present

## 2022-04-23 ENCOUNTER — Other Ambulatory Visit: Payer: Self-pay | Admitting: Family Medicine

## 2022-04-23 DIAGNOSIS — S88111A Complete traumatic amputation at level between knee and ankle, right lower leg, initial encounter: Secondary | ICD-10-CM

## 2022-04-24 DIAGNOSIS — Z9181 History of falling: Secondary | ICD-10-CM | POA: Diagnosis not present

## 2022-04-24 DIAGNOSIS — R296 Repeated falls: Secondary | ICD-10-CM | POA: Diagnosis not present

## 2022-04-24 DIAGNOSIS — R260 Ataxic gait: Secondary | ICD-10-CM | POA: Diagnosis not present

## 2022-04-24 DIAGNOSIS — R262 Difficulty in walking, not elsewhere classified: Secondary | ICD-10-CM | POA: Diagnosis not present

## 2022-04-26 ENCOUNTER — Other Ambulatory Visit: Payer: Self-pay | Admitting: Family Medicine

## 2022-04-26 DIAGNOSIS — R296 Repeated falls: Secondary | ICD-10-CM | POA: Diagnosis not present

## 2022-04-26 DIAGNOSIS — R262 Difficulty in walking, not elsewhere classified: Secondary | ICD-10-CM | POA: Diagnosis not present

## 2022-04-26 DIAGNOSIS — Z9181 History of falling: Secondary | ICD-10-CM | POA: Diagnosis not present

## 2022-04-26 DIAGNOSIS — R260 Ataxic gait: Secondary | ICD-10-CM | POA: Diagnosis not present

## 2022-04-27 MED ORDER — SERTRALINE HCL 100 MG PO TABS: ORAL_TABLET | ORAL | 1 refills | Status: DC

## 2022-04-27 NOTE — Addendum Note (Signed)
Addended by: Nilda Riggs on: 04/27/2022 10:17 AM   Modules accepted: Orders

## 2022-05-01 DIAGNOSIS — R260 Ataxic gait: Secondary | ICD-10-CM | POA: Diagnosis not present

## 2022-05-01 DIAGNOSIS — Z9181 History of falling: Secondary | ICD-10-CM | POA: Diagnosis not present

## 2022-05-01 DIAGNOSIS — R262 Difficulty in walking, not elsewhere classified: Secondary | ICD-10-CM | POA: Diagnosis not present

## 2022-05-01 DIAGNOSIS — R296 Repeated falls: Secondary | ICD-10-CM | POA: Diagnosis not present

## 2022-05-03 DIAGNOSIS — R262 Difficulty in walking, not elsewhere classified: Secondary | ICD-10-CM | POA: Diagnosis not present

## 2022-05-03 DIAGNOSIS — Z9181 History of falling: Secondary | ICD-10-CM | POA: Diagnosis not present

## 2022-05-03 DIAGNOSIS — R260 Ataxic gait: Secondary | ICD-10-CM | POA: Diagnosis not present

## 2022-05-03 DIAGNOSIS — R296 Repeated falls: Secondary | ICD-10-CM | POA: Diagnosis not present

## 2022-05-04 DIAGNOSIS — H2513 Age-related nuclear cataract, bilateral: Secondary | ICD-10-CM | POA: Diagnosis not present

## 2022-05-04 DIAGNOSIS — H04123 Dry eye syndrome of bilateral lacrimal glands: Secondary | ICD-10-CM | POA: Diagnosis not present

## 2022-05-04 DIAGNOSIS — H16141 Punctate keratitis, right eye: Secondary | ICD-10-CM | POA: Diagnosis not present

## 2022-05-08 DIAGNOSIS — R296 Repeated falls: Secondary | ICD-10-CM | POA: Diagnosis not present

## 2022-05-08 DIAGNOSIS — R260 Ataxic gait: Secondary | ICD-10-CM | POA: Diagnosis not present

## 2022-05-08 DIAGNOSIS — R262 Difficulty in walking, not elsewhere classified: Secondary | ICD-10-CM | POA: Diagnosis not present

## 2022-05-08 DIAGNOSIS — Z9181 History of falling: Secondary | ICD-10-CM | POA: Diagnosis not present

## 2022-05-10 DIAGNOSIS — Z9181 History of falling: Secondary | ICD-10-CM | POA: Diagnosis not present

## 2022-05-10 DIAGNOSIS — R260 Ataxic gait: Secondary | ICD-10-CM | POA: Diagnosis not present

## 2022-05-10 DIAGNOSIS — R262 Difficulty in walking, not elsewhere classified: Secondary | ICD-10-CM | POA: Diagnosis not present

## 2022-05-10 DIAGNOSIS — R296 Repeated falls: Secondary | ICD-10-CM | POA: Diagnosis not present

## 2022-05-15 DIAGNOSIS — Z9181 History of falling: Secondary | ICD-10-CM | POA: Diagnosis not present

## 2022-05-15 DIAGNOSIS — R260 Ataxic gait: Secondary | ICD-10-CM | POA: Diagnosis not present

## 2022-05-15 DIAGNOSIS — R296 Repeated falls: Secondary | ICD-10-CM | POA: Diagnosis not present

## 2022-05-15 DIAGNOSIS — R262 Difficulty in walking, not elsewhere classified: Secondary | ICD-10-CM | POA: Diagnosis not present

## 2022-05-17 DIAGNOSIS — R260 Ataxic gait: Secondary | ICD-10-CM | POA: Diagnosis not present

## 2022-05-17 DIAGNOSIS — R296 Repeated falls: Secondary | ICD-10-CM | POA: Diagnosis not present

## 2022-05-17 DIAGNOSIS — R262 Difficulty in walking, not elsewhere classified: Secondary | ICD-10-CM | POA: Diagnosis not present

## 2022-05-17 DIAGNOSIS — Z9181 History of falling: Secondary | ICD-10-CM | POA: Diagnosis not present

## 2022-05-22 DIAGNOSIS — Z9181 History of falling: Secondary | ICD-10-CM | POA: Diagnosis not present

## 2022-05-22 DIAGNOSIS — R296 Repeated falls: Secondary | ICD-10-CM | POA: Diagnosis not present

## 2022-05-22 DIAGNOSIS — R260 Ataxic gait: Secondary | ICD-10-CM | POA: Diagnosis not present

## 2022-05-22 DIAGNOSIS — R262 Difficulty in walking, not elsewhere classified: Secondary | ICD-10-CM | POA: Diagnosis not present

## 2022-05-24 DIAGNOSIS — R260 Ataxic gait: Secondary | ICD-10-CM | POA: Diagnosis not present

## 2022-05-24 DIAGNOSIS — R262 Difficulty in walking, not elsewhere classified: Secondary | ICD-10-CM | POA: Diagnosis not present

## 2022-05-24 DIAGNOSIS — Z9181 History of falling: Secondary | ICD-10-CM | POA: Diagnosis not present

## 2022-05-24 DIAGNOSIS — R296 Repeated falls: Secondary | ICD-10-CM | POA: Diagnosis not present

## 2022-05-28 DIAGNOSIS — R296 Repeated falls: Secondary | ICD-10-CM | POA: Diagnosis not present

## 2022-05-28 DIAGNOSIS — Z9181 History of falling: Secondary | ICD-10-CM | POA: Diagnosis not present

## 2022-05-28 DIAGNOSIS — R260 Ataxic gait: Secondary | ICD-10-CM | POA: Diagnosis not present

## 2022-05-28 DIAGNOSIS — R262 Difficulty in walking, not elsewhere classified: Secondary | ICD-10-CM | POA: Diagnosis not present

## 2022-05-31 DIAGNOSIS — Z9181 History of falling: Secondary | ICD-10-CM | POA: Diagnosis not present

## 2022-05-31 DIAGNOSIS — R260 Ataxic gait: Secondary | ICD-10-CM | POA: Diagnosis not present

## 2022-05-31 DIAGNOSIS — R262 Difficulty in walking, not elsewhere classified: Secondary | ICD-10-CM | POA: Diagnosis not present

## 2022-05-31 DIAGNOSIS — R296 Repeated falls: Secondary | ICD-10-CM | POA: Diagnosis not present

## 2022-06-05 DIAGNOSIS — R262 Difficulty in walking, not elsewhere classified: Secondary | ICD-10-CM | POA: Diagnosis not present

## 2022-06-05 DIAGNOSIS — R296 Repeated falls: Secondary | ICD-10-CM | POA: Diagnosis not present

## 2022-06-05 DIAGNOSIS — R260 Ataxic gait: Secondary | ICD-10-CM | POA: Diagnosis not present

## 2022-06-05 DIAGNOSIS — Z9181 History of falling: Secondary | ICD-10-CM | POA: Diagnosis not present

## 2022-06-07 DIAGNOSIS — R262 Difficulty in walking, not elsewhere classified: Secondary | ICD-10-CM | POA: Diagnosis not present

## 2022-06-07 DIAGNOSIS — Z9181 History of falling: Secondary | ICD-10-CM | POA: Diagnosis not present

## 2022-06-07 DIAGNOSIS — R296 Repeated falls: Secondary | ICD-10-CM | POA: Diagnosis not present

## 2022-06-07 DIAGNOSIS — R260 Ataxic gait: Secondary | ICD-10-CM | POA: Diagnosis not present

## 2022-06-12 DIAGNOSIS — R260 Ataxic gait: Secondary | ICD-10-CM | POA: Diagnosis not present

## 2022-06-12 DIAGNOSIS — R262 Difficulty in walking, not elsewhere classified: Secondary | ICD-10-CM | POA: Diagnosis not present

## 2022-06-12 DIAGNOSIS — Z9181 History of falling: Secondary | ICD-10-CM | POA: Diagnosis not present

## 2022-06-12 DIAGNOSIS — R296 Repeated falls: Secondary | ICD-10-CM | POA: Diagnosis not present

## 2022-06-14 DIAGNOSIS — Z9181 History of falling: Secondary | ICD-10-CM | POA: Diagnosis not present

## 2022-06-14 DIAGNOSIS — R296 Repeated falls: Secondary | ICD-10-CM | POA: Diagnosis not present

## 2022-06-14 DIAGNOSIS — R260 Ataxic gait: Secondary | ICD-10-CM | POA: Diagnosis not present

## 2022-06-14 DIAGNOSIS — R262 Difficulty in walking, not elsewhere classified: Secondary | ICD-10-CM | POA: Diagnosis not present

## 2022-06-19 DIAGNOSIS — R296 Repeated falls: Secondary | ICD-10-CM | POA: Diagnosis not present

## 2022-06-19 DIAGNOSIS — R262 Difficulty in walking, not elsewhere classified: Secondary | ICD-10-CM | POA: Diagnosis not present

## 2022-06-19 DIAGNOSIS — Z9181 History of falling: Secondary | ICD-10-CM | POA: Diagnosis not present

## 2022-06-19 DIAGNOSIS — R260 Ataxic gait: Secondary | ICD-10-CM | POA: Diagnosis not present

## 2022-06-21 DIAGNOSIS — R296 Repeated falls: Secondary | ICD-10-CM | POA: Diagnosis not present

## 2022-06-21 DIAGNOSIS — R260 Ataxic gait: Secondary | ICD-10-CM | POA: Diagnosis not present

## 2022-06-21 DIAGNOSIS — Z9181 History of falling: Secondary | ICD-10-CM | POA: Diagnosis not present

## 2022-06-21 DIAGNOSIS — R262 Difficulty in walking, not elsewhere classified: Secondary | ICD-10-CM | POA: Diagnosis not present

## 2022-06-26 DIAGNOSIS — Z9181 History of falling: Secondary | ICD-10-CM | POA: Diagnosis not present

## 2022-06-26 DIAGNOSIS — R262 Difficulty in walking, not elsewhere classified: Secondary | ICD-10-CM | POA: Diagnosis not present

## 2022-06-26 DIAGNOSIS — R260 Ataxic gait: Secondary | ICD-10-CM | POA: Diagnosis not present

## 2022-06-26 DIAGNOSIS — R296 Repeated falls: Secondary | ICD-10-CM | POA: Diagnosis not present

## 2022-06-28 DIAGNOSIS — R262 Difficulty in walking, not elsewhere classified: Secondary | ICD-10-CM | POA: Diagnosis not present

## 2022-06-28 DIAGNOSIS — R296 Repeated falls: Secondary | ICD-10-CM | POA: Diagnosis not present

## 2022-06-28 DIAGNOSIS — Z9181 History of falling: Secondary | ICD-10-CM | POA: Diagnosis not present

## 2022-06-28 DIAGNOSIS — R260 Ataxic gait: Secondary | ICD-10-CM | POA: Diagnosis not present

## 2022-07-03 DIAGNOSIS — G10 Huntington's disease: Secondary | ICD-10-CM | POA: Diagnosis not present

## 2022-07-05 DIAGNOSIS — R296 Repeated falls: Secondary | ICD-10-CM | POA: Diagnosis not present

## 2022-07-05 DIAGNOSIS — R260 Ataxic gait: Secondary | ICD-10-CM | POA: Diagnosis not present

## 2022-07-05 DIAGNOSIS — R262 Difficulty in walking, not elsewhere classified: Secondary | ICD-10-CM | POA: Diagnosis not present

## 2022-07-05 DIAGNOSIS — Z9181 History of falling: Secondary | ICD-10-CM | POA: Diagnosis not present

## 2022-07-10 DIAGNOSIS — R262 Difficulty in walking, not elsewhere classified: Secondary | ICD-10-CM | POA: Diagnosis not present

## 2022-07-10 DIAGNOSIS — R260 Ataxic gait: Secondary | ICD-10-CM | POA: Diagnosis not present

## 2022-07-10 DIAGNOSIS — R296 Repeated falls: Secondary | ICD-10-CM | POA: Diagnosis not present

## 2022-07-10 DIAGNOSIS — Z9181 History of falling: Secondary | ICD-10-CM | POA: Diagnosis not present

## 2022-07-12 DIAGNOSIS — R296 Repeated falls: Secondary | ICD-10-CM | POA: Diagnosis not present

## 2022-07-12 DIAGNOSIS — R260 Ataxic gait: Secondary | ICD-10-CM | POA: Diagnosis not present

## 2022-07-12 DIAGNOSIS — R262 Difficulty in walking, not elsewhere classified: Secondary | ICD-10-CM | POA: Diagnosis not present

## 2022-07-12 DIAGNOSIS — Z9181 History of falling: Secondary | ICD-10-CM | POA: Diagnosis not present

## 2022-07-17 DIAGNOSIS — R296 Repeated falls: Secondary | ICD-10-CM | POA: Diagnosis not present

## 2022-07-17 DIAGNOSIS — Z9181 History of falling: Secondary | ICD-10-CM | POA: Diagnosis not present

## 2022-07-17 DIAGNOSIS — R260 Ataxic gait: Secondary | ICD-10-CM | POA: Diagnosis not present

## 2022-07-17 DIAGNOSIS — R262 Difficulty in walking, not elsewhere classified: Secondary | ICD-10-CM | POA: Diagnosis not present

## 2022-07-19 DIAGNOSIS — R260 Ataxic gait: Secondary | ICD-10-CM | POA: Diagnosis not present

## 2022-07-19 DIAGNOSIS — R262 Difficulty in walking, not elsewhere classified: Secondary | ICD-10-CM | POA: Diagnosis not present

## 2022-07-19 DIAGNOSIS — R296 Repeated falls: Secondary | ICD-10-CM | POA: Diagnosis not present

## 2022-07-19 DIAGNOSIS — Z9181 History of falling: Secondary | ICD-10-CM | POA: Diagnosis not present

## 2022-07-24 DIAGNOSIS — R296 Repeated falls: Secondary | ICD-10-CM | POA: Diagnosis not present

## 2022-07-24 DIAGNOSIS — Z9181 History of falling: Secondary | ICD-10-CM | POA: Diagnosis not present

## 2022-07-24 DIAGNOSIS — R260 Ataxic gait: Secondary | ICD-10-CM | POA: Diagnosis not present

## 2022-07-24 DIAGNOSIS — R262 Difficulty in walking, not elsewhere classified: Secondary | ICD-10-CM | POA: Diagnosis not present

## 2022-07-26 DIAGNOSIS — Z9181 History of falling: Secondary | ICD-10-CM | POA: Diagnosis not present

## 2022-07-26 DIAGNOSIS — R262 Difficulty in walking, not elsewhere classified: Secondary | ICD-10-CM | POA: Diagnosis not present

## 2022-07-26 DIAGNOSIS — R296 Repeated falls: Secondary | ICD-10-CM | POA: Diagnosis not present

## 2022-07-26 DIAGNOSIS — R260 Ataxic gait: Secondary | ICD-10-CM | POA: Diagnosis not present

## 2022-07-30 NOTE — Progress Notes (Unsigned)
ACUTE VISIT No chief complaint on file.  HPI: [name] is a 67 y.o. female, who is here today complaining of *** HPI  Review of Systems See other pertinent positives and negatives in HPI.  Current Outpatient Medications on File Prior to Visit  Medication Sig Dispense Refill   acetaminophen (TYLENOL) 500 MG tablet Take 1,000 mg by mouth every morning.     ciclopirox (LOPROX) 0.77 % cream Apply topically 2 (two) times daily. 15 g 1   doxycycline (VIBRAMYCIN) 100 MG capsule Take 1 capsule (100 mg total) by mouth 2 (two) times daily. 14 capsule 0   ketotifen (ZADITOR) 0.025 % ophthalmic solution Place 1 drop into both eyes 2 (two) times daily. 5 mL 0   risperiDONE (RISPERDAL) 0.5 MG tablet Take 0.5 mg by mouth 2 (two) times daily.     sennosides-docusate sodium (SENOKOT-S) 8.6-50 MG tablet Take 1 tablet by mouth daily.     sertraline (ZOLOFT) 100 MG tablet TAKE 1 TABLET(100 MG) BY MOUTH DAILY 90 tablet 1   No current facility-administered medications on file prior to visit.    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 difficile diarrhea    Endometriosis    Huntington disease (HCC)    Peptic ulcer    Perforated gastric ulcer (HCC)    Scoliosis    Allergies  Allergen Reactions   Bee Venom Anaphylaxis and Swelling   Nsaids Other (See Comments)    PERFORATED ULCER = NO MORE NSAIDs   Adhesive [Tape] Other (See Comments)    redness   Codeine Nausea And Vomiting   Penicillins     Did it involve swelling of the face/tongue/throat, SOB, or low BP? Yes Did it involve sudden or severe rash/hives, skin peeling, or any reaction on the inside of your mouth or nose? Unknown Did you need to seek medical attention at a hospital or doctor's office? Yes When did it last happen?   age [age]    If all above answers are "NO", may proceed with cephalosporin use.    Sulfa  Antibiotics Itching    Social History   Socioeconomic History   Marital status: Married    Spouse name: Not on file   Number of children: Not on file   Years of education: Not on file   Highest education level: Not on file  Occupational History   Occupation: unemployed    Comment: disabled back pain  Tobacco Use   Smoking status: Former    Packs/day: 1.00    Years: 8.00    Additional pack years: 0.00    Total pack years: 8.00    Types: Cigarettes    Quit date: 01/17/1994    Years since quitting: 28.5   Smokeless tobacco: Never  Vaping Use   Vaping Use: Never used  Substance and Sexual Activity   Alcohol use: Yes    Alcohol/week: 3.0 standard drinks of alcohol    Types: 3 Cans of beer per week    Comment: per week   Drug use: No   Sexual activity: Not on file  Other Topics Concern   Not on file  Social History Narrative   Married   Right BKA    Disabled due to back problems       Social Determinants of Health   Financial Resource Strain: Low Risk  (09/21/2021)  Overall Financial Resource Strain (CARDIA)    Difficulty of Paying Living Expenses: Not hard at all  Food Insecurity: No Food Insecurity (09/21/2021)   Hunger Vital Sign    Worried About Running Out of Food in the Last Year: Never true    Ran Out of Food in the Last Year: Never true  Transportation Needs: No Transportation Needs (09/21/2021)   PRAPARE - Administrator, Civil Service (Medical): No    Lack of Transportation (Non-Medical): No  Physical Activity: Insufficiently Active (09/21/2021)   Exercise Vital Sign    Days of Exercise per Week: 2 days    Minutes of Exercise per Session: 60 min  Stress: No Stress Concern Present (09/21/2021)   [employer] of Occupational Health - Occupational Stress Questionnaire    Feeling of Stress : Not at all  Social Connections: Moderately Integrated (09/21/2021)   Social Connection and Isolation Panel [NHANES]    Frequency of Communication with  Friends and Family: More than three times a week    Frequency of Social Gatherings with Friends and Family: More than three times a week    Attends Religious Services: Never    Database administrator or Organizations: Yes    Attends Engineer, structural: More than 4 times per year    Marital Status: Married    There were no vitals filed for this visit. There is no height or weight on file to calculate BMI.  Physical Exam  ASSESSMENT AND PLAN: There are no diagnoses linked to this encounter.  No follow-ups on file.  Mussa Groesbeck G. [place], MD  [facility]. [place] office.  Discharge Instructions   None

## 2022-07-31 ENCOUNTER — Ambulatory Visit (INDEPENDENT_AMBULATORY_CARE_PROVIDER_SITE_OTHER): Payer: Medicare Other | Admitting: Family Medicine

## 2022-07-31 ENCOUNTER — Encounter: Payer: Self-pay | Admitting: Family Medicine

## 2022-07-31 VITALS — BP 110/70 | HR 79 | Resp 16 | Ht 64.0 in

## 2022-07-31 DIAGNOSIS — R296 Repeated falls: Secondary | ICD-10-CM | POA: Diagnosis not present

## 2022-07-31 DIAGNOSIS — R7309 Other abnormal glucose: Secondary | ICD-10-CM | POA: Diagnosis not present

## 2022-07-31 DIAGNOSIS — R350 Frequency of micturition: Secondary | ICD-10-CM

## 2022-07-31 DIAGNOSIS — R262 Difficulty in walking, not elsewhere classified: Secondary | ICD-10-CM | POA: Diagnosis not present

## 2022-07-31 DIAGNOSIS — Z9181 History of falling: Secondary | ICD-10-CM | POA: Diagnosis not present

## 2022-07-31 DIAGNOSIS — R260 Ataxic gait: Secondary | ICD-10-CM | POA: Diagnosis not present

## 2022-07-31 LAB — POCT GLYCOSYLATED HEMOGLOBIN (HGB A1C): Hemoglobin A1C: 5.3 % (ref 4.0–5.6)

## 2022-07-31 MED ORDER — NITROFURANTOIN MONOHYD MACRO 100 MG PO CAPS: 100.0000 mg | ORAL_CAPSULE | Freq: Two times a day (BID) | ORAL | 0 refills | Status: DC

## 2022-07-31 NOTE — Patient Instructions (Addendum)
A few things to remember from today's visit:  Urinary frequency - Plan: Culture, Urine, Urinalysis, Routine w reflex microscopic  Elevated glucose level  Antibiotic started today, Nitrofurantoin for 5 days.  Adequate fluid intake, avoid holding urine for long hours, and over the counter Vit C OR cranberry capsules might help.  Today we will treat empirically with antibiotic, which we might need to change when urine culture comes back depending of bacteria susceptibility.  Seek immediate medical attention if severe abdominal pain, vomiting, fever/chills, or worsening symptoms. F/U if symptomatic are not any better after 2-3 days of antibiotic treatment.  Do not use My Chart to request refills or for acute issues that need immediate attention. If you send a my chart message, it may take a few days to be addressed, specially if I am not in the office.  Please be sure medication list is accurate. If a new problem present, please set up appointment sooner than planned today.

## 2022-08-01 ENCOUNTER — Other Ambulatory Visit: Payer: Medicare Other

## 2022-08-01 DIAGNOSIS — R350 Frequency of micturition: Secondary | ICD-10-CM | POA: Diagnosis not present

## 2022-08-02 DIAGNOSIS — R262 Difficulty in walking, not elsewhere classified: Secondary | ICD-10-CM | POA: Diagnosis not present

## 2022-08-02 DIAGNOSIS — R296 Repeated falls: Secondary | ICD-10-CM | POA: Diagnosis not present

## 2022-08-02 DIAGNOSIS — Z9181 History of falling: Secondary | ICD-10-CM | POA: Diagnosis not present

## 2022-08-02 DIAGNOSIS — R260 Ataxic gait: Secondary | ICD-10-CM | POA: Diagnosis not present

## 2022-08-02 LAB — URINALYSIS, ROUTINE W REFLEX MICROSCOPIC
Bilirubin Urine: NEGATIVE
Hgb urine dipstick: NEGATIVE
Ketones, ur: NEGATIVE
Nitrite: NEGATIVE
Specific Gravity, Urine: 1.02 (ref 1.000–1.030)
Total Protein, Urine: NEGATIVE
Urine Glucose: NEGATIVE
Urobilinogen, UA: 0.2 (ref 0.0–1.0)
pH: 6 (ref 5.0–8.0)

## 2022-08-03 ENCOUNTER — Other Ambulatory Visit: Payer: Self-pay | Admitting: Family Medicine

## 2022-08-03 DIAGNOSIS — N390 Urinary tract infection, site not specified: Secondary | ICD-10-CM

## 2022-08-03 LAB — URINE CULTURE
MICRO NUMBER:: 14868123
SPECIMEN QUALITY:: ADEQUATE

## 2022-08-03 MED ORDER — CEPHALEXIN 500 MG PO CAPS
500.0000 mg | ORAL_CAPSULE | Freq: Four times a day (QID) | ORAL | 0 refills | Status: AC
Start: 2022-08-03 — End: 2022-08-08

## 2022-08-06 ENCOUNTER — Telehealth: Payer: Self-pay

## 2022-08-06 NOTE — Telephone Encounter (Signed)
Patient's husband called in wanting to know which abx patient was to stop & which one to start. Patient is to stop the Macrobid and start Keflex. Patient's husband verbalized understanding.

## 2022-08-07 DIAGNOSIS — Z9181 History of falling: Secondary | ICD-10-CM | POA: Diagnosis not present

## 2022-08-07 DIAGNOSIS — R262 Difficulty in walking, not elsewhere classified: Secondary | ICD-10-CM | POA: Diagnosis not present

## 2022-08-07 DIAGNOSIS — R296 Repeated falls: Secondary | ICD-10-CM | POA: Diagnosis not present

## 2022-08-07 DIAGNOSIS — R260 Ataxic gait: Secondary | ICD-10-CM | POA: Diagnosis not present

## 2022-08-09 DIAGNOSIS — R260 Ataxic gait: Secondary | ICD-10-CM | POA: Diagnosis not present

## 2022-08-09 DIAGNOSIS — R296 Repeated falls: Secondary | ICD-10-CM | POA: Diagnosis not present

## 2022-08-09 DIAGNOSIS — R262 Difficulty in walking, not elsewhere classified: Secondary | ICD-10-CM | POA: Diagnosis not present

## 2022-08-09 DIAGNOSIS — Z9181 History of falling: Secondary | ICD-10-CM | POA: Diagnosis not present

## 2022-08-14 DIAGNOSIS — R262 Difficulty in walking, not elsewhere classified: Secondary | ICD-10-CM | POA: Diagnosis not present

## 2022-08-14 DIAGNOSIS — R260 Ataxic gait: Secondary | ICD-10-CM | POA: Diagnosis not present

## 2022-08-14 DIAGNOSIS — Z9181 History of falling: Secondary | ICD-10-CM | POA: Diagnosis not present

## 2022-08-14 DIAGNOSIS — R296 Repeated falls: Secondary | ICD-10-CM | POA: Diagnosis not present

## 2022-08-16 DIAGNOSIS — R262 Difficulty in walking, not elsewhere classified: Secondary | ICD-10-CM | POA: Diagnosis not present

## 2022-08-16 DIAGNOSIS — R296 Repeated falls: Secondary | ICD-10-CM | POA: Diagnosis not present

## 2022-08-16 DIAGNOSIS — Z9181 History of falling: Secondary | ICD-10-CM | POA: Diagnosis not present

## 2022-08-16 DIAGNOSIS — R260 Ataxic gait: Secondary | ICD-10-CM | POA: Diagnosis not present

## 2022-08-21 DIAGNOSIS — R296 Repeated falls: Secondary | ICD-10-CM | POA: Diagnosis not present

## 2022-08-21 DIAGNOSIS — R262 Difficulty in walking, not elsewhere classified: Secondary | ICD-10-CM | POA: Diagnosis not present

## 2022-08-21 DIAGNOSIS — R260 Ataxic gait: Secondary | ICD-10-CM | POA: Diagnosis not present

## 2022-08-21 DIAGNOSIS — Z9181 History of falling: Secondary | ICD-10-CM | POA: Diagnosis not present

## 2022-08-23 DIAGNOSIS — R296 Repeated falls: Secondary | ICD-10-CM | POA: Diagnosis not present

## 2022-08-23 DIAGNOSIS — Z9181 History of falling: Secondary | ICD-10-CM | POA: Diagnosis not present

## 2022-08-23 DIAGNOSIS — R262 Difficulty in walking, not elsewhere classified: Secondary | ICD-10-CM | POA: Diagnosis not present

## 2022-08-23 DIAGNOSIS — R260 Ataxic gait: Secondary | ICD-10-CM | POA: Diagnosis not present

## 2022-08-27 ENCOUNTER — Telehealth: Payer: Self-pay | Admitting: Family Medicine

## 2022-08-27 NOTE — Telephone Encounter (Signed)
Needs another appt for futher eval.

## 2022-08-27 NOTE — Telephone Encounter (Signed)
Saw [place] 07/31/22 was prescribed cephALEXin (KEFLEX) 500 MG capsule says patient still suffering from same  symptoms, medication did not help

## 2022-08-27 NOTE — Telephone Encounter (Signed)
I talked with pt's husband, he was in a drive thru lane. He will call back, please go ahead and schedule for an appointment to eval further.

## 2022-08-28 DIAGNOSIS — R262 Difficulty in walking, not elsewhere classified: Secondary | ICD-10-CM | POA: Diagnosis not present

## 2022-08-28 DIAGNOSIS — Z9181 History of falling: Secondary | ICD-10-CM | POA: Diagnosis not present

## 2022-08-28 DIAGNOSIS — R296 Repeated falls: Secondary | ICD-10-CM | POA: Diagnosis not present

## 2022-08-28 DIAGNOSIS — R260 Ataxic gait: Secondary | ICD-10-CM | POA: Diagnosis not present

## 2022-08-28 NOTE — Telephone Encounter (Signed)
Pt husband sched an appt for this upcoming Friday 5/24 at 11am w/ Dr. [place].   FYI

## 2022-08-29 NOTE — Progress Notes (Unsigned)
ACUTE VISIT Chief Complaint  Patient presents with   urinary problem    Urinary frequency, denies burning. Recently treated for UTI last month.    HPI: Ms.Phoenicia Buttolph is a 67 y.o. female,with PMHx significant for Huntington disease, PUD,GERD, depression,and OA ere today with her husband complaining of ongoing issues of urinary frequency, which she has been experiencing since before her last visit. On 07/31/22, she was seen for similar symptom with associated urinary incontinence, the latter has resolved and frequency is back to her baseline.  Urinary Frequency  The current episode started more than 1 month ago. The problem has been unchanged. The patient is experiencing no pain. There has been no fever. There is No history of pyelonephritis. Associated symptoms include frequency and urgency. Pertinent negatives include no chills, discharge, flank pain, hematuria, hesitancy, nausea, sweats or vomiting. She has tried antibiotics for the symptoms.  She clarifies that there is no associated burning sensation or blood in her urine.  She also mentions that she wakes up a couple of times at night to urinate, which she considers somewhat normal for her. She denies any symptoms of urinary incontinence. The color of her urine is yellow, and she has no issues with constipation, vaginal bleeding, or discharge.  Review of Systems  Constitutional:  Negative for activity change, appetite change and chills.  Gastrointestinal:  Negative for abdominal pain, nausea and vomiting.  Endocrine: Negative for polydipsia and polyuria.  Genitourinary:  Positive for frequency and urgency. Negative for flank pain, hematuria and hesitancy.  Neurological:  Negative for syncope.  Psychiatric/Behavioral:  Negative for confusion and hallucinations.   See other pertinent positives and negatives in HPI.  Current Outpatient Medications on File Prior to Visit  Medication Sig Dispense Refill   acetaminophen (TYLENOL) 500 MG  tablet Take 1,000 mg by mouth every morning.     ciclopirox (LOPROX) 0.77 % cream Apply topically 2 (two) times daily. 15 g 1   ketotifen (ZADITOR) 0.025 % ophthalmic solution Place 1 drop into both eyes 2 (two) times daily. 5 mL 0   risperiDONE (RISPERDAL) 0.5 MG tablet Take 0.5 mg by mouth 2 (two) times daily.     sennosides-docusate sodium (SENOKOT-S) 8.6-50 MG tablet Take 1 tablet by mouth daily.     sertraline (ZOLOFT) 100 MG tablet TAKE 1 TABLET(100 MG) BY MOUTH DAILY 90 tablet 1   No current facility-administered medications on file prior to visit.   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 difficile diarrhea    Endometriosis    Huntington disease (HCC)    Peptic ulcer    Perforated gastric ulcer (HCC)    Scoliosis    Allergies  Allergen Reactions   Bee Venom Anaphylaxis and Swelling   Nsaids Other (See Comments)    PERFORATED ULCER = NO MORE NSAIDs   Adhesive [Tape] Other (See Comments)    redness   Codeine Nausea And Vomiting   Penicillins     Did it involve swelling of the face/tongue/throat, SOB, or low BP? Yes Did it involve sudden or severe rash/hives, skin peeling, or any reaction on the inside of your mouth or nose? Unknown Did you need to seek medical attention at a hospital or doctor's office? Yes When did it last happen?   age 30-40    If all above answers are "NO", may proceed with cephalosporin  use.    Sulfa Antibiotics Itching    Social History   Socioeconomic History   Marital status: Married    Spouse name: Not on file   Number of children: Not on file   Years of education: Not on file   Highest education level: Not on file  Occupational History   Occupation: unemployed    Comment: disabled back pain  Tobacco Use   Smoking status: Former    Packs/day: 1.00    Years: 8.00    Additional pack years: 0.00    Total pack years: 8.00     Types: Cigarettes    Quit date: 01/17/1994    Years since quitting: 28.6   Smokeless tobacco: Never  Vaping Use   Vaping Use: Never used  Substance and Sexual Activity   Alcohol use: Yes    Alcohol/week: 3.0 standard drinks of alcohol    Types: 3 Cans of beer per week    Comment: per week   Drug use: No   Sexual activity: Not on file  Other Topics Concern   Not on file  Social History Narrative   Married   Right [employer]KA    Disabled due to back problems       Social Determinants of Health   Financial Resource Strain: Low Risk  (09/21/2021)   Overall Financial Resource Strain (CARDIA)    Difficulty of Paying Living Expenses: Not hard at all  Food Insecurity: No Food Insecurity (09/21/2021)   Hunger Vital Sign    Worried About Running Out of Food in the Last Year: Never true    Ran Out of Food in the Last Year: Never true  Transportation Needs: No Transportation Needs (09/21/2021)   PRAPARE - Administrator, Civil Service (Medical): No    Lack of Transportation (Non-Medical): No  Physical Activity: Insufficiently Active (09/21/2021)   Exercise Vital Sign    Days of Exercise per Week: 2 days    Minutes of Exercise per Session: 60 min  Stress: No Stress Concern Present (09/21/2021)   [employer] of Occupational Health - Occupational Stress Questionnaire    Feeling of Stress : Not at all  Social Connections: Moderately Integrated (09/21/2021)   Social Connection and Isolation Panel [NHANES]    Frequency of Communication with Friends and Family: More than three times a week    Frequency of Social Gatherings with Friends and Family: More than three times a week    Attends Religious Services: Never    Database administrator or Organizations: Yes    Attends Banker Meetings: More than 4 times per year    Marital Status: Married   Vitals:   08/31/22 1059  BP: 118/70  Pulse: 82  Resp: 16  SpO2: 96%   Body mass index is 25.58 kg/m.  Physical  Exam Vitals and nursing note reviewed.  Constitutional:      General: She is not in acute distress.    Appearance: She is well-developed.  HENT:     Head: Normocephalic and atraumatic.     Mouth/Throat:     Mouth: Mucous membranes are moist.  Eyes:     Conjunctiva/sclera: Conjunctivae normal.  Cardiovascular:     Rate and Rhythm: Normal rate and regular rhythm.  Pulmonary:     Effort: Pulmonary effort is normal. No respiratory distress.     Breath sounds: Normal breath sounds.  Abdominal:     Palpations: Abdomen is soft. There is no mass.  Tenderness: There is no abdominal tenderness.  Skin:    General: Skin is warm.     Findings: No erythema.  Neurological:     Mental Status: She is alert and oriented to person, place, and time.     Comments: RUE abnormal tone, contractures. She is in a wheel chair.  Psychiatric:        Mood and Affect: Mood and affect normal.   ASSESSMENT AND PLAN:  Ms. Neeman was seen today for urinary frequency.  Urinary frequency Seems to be a chronic problem, no associated dysuria. She completed abx treatment. I do not think we need to repeat UA today. Monitor for new symptoms.  -     Mirabegron ER; Take 1 tablet (25 mg total) by mouth daily.  Dispense: 14 tablet; Refill: 0  Overactive bladder We discussed differential Dx and treatment options. She agrees with trying Myrbetriq 25 mg daily, samples given. Side effects discussed, including the risk of urinary retention. If she tolerates well and before running out of sample , she was instructed to call for refills. F/U with PCP in 6-8 weeks.  -     Mirabegron ER; Take 1 tablet (25 mg total) by mouth daily.  Dispense: 14 tablet; Refill: 0  Return in about 2 months (around 10/31/2022) for urinary frequency with PCP.  Modena Bellemare G. Sw[place], MD  [facility]. [place] office.

## 2022-08-30 DIAGNOSIS — R262 Difficulty in walking, not elsewhere classified: Secondary | ICD-10-CM | POA: Diagnosis not present

## 2022-08-30 DIAGNOSIS — R296 Repeated falls: Secondary | ICD-10-CM | POA: Diagnosis not present

## 2022-08-30 DIAGNOSIS — R260 Ataxic gait: Secondary | ICD-10-CM | POA: Diagnosis not present

## 2022-08-30 DIAGNOSIS — Z9181 History of falling: Secondary | ICD-10-CM | POA: Diagnosis not present

## 2022-08-31 ENCOUNTER — Encounter: Payer: Self-pay | Admitting: Family Medicine

## 2022-08-31 ENCOUNTER — Ambulatory Visit (INDEPENDENT_AMBULATORY_CARE_PROVIDER_SITE_OTHER): Payer: Medicare Other | Admitting: Family Medicine

## 2022-08-31 VITALS — BP 118/70 | HR 82 | Resp 16 | Ht 64.0 in

## 2022-08-31 DIAGNOSIS — R350 Frequency of micturition: Secondary | ICD-10-CM

## 2022-08-31 DIAGNOSIS — N3281 Overactive bladder: Secondary | ICD-10-CM | POA: Diagnosis not present

## 2022-08-31 NOTE — Patient Instructions (Signed)
A few things to remember from today's visit:  Urinary frequency  Overactive bladder  Today we are starting with Myrbetriq 25 mg to take once daily. We you go through the samples let me know before running out, so I can send a new prescriptions. Monitor for new symptoms. Kegel exercises may help.  Do not use My Chart to request refills or for acute issues that need immediate attention. If you send a my chart message, it may take a few days to be addressed, specially if I am not in the office.  Please be sure medication list is accurate. If a new problem present, please set up appointment sooner than planned today.

## 2022-09-01 MED ORDER — MIRABEGRON ER 25 MG PO TB24
25.0000 mg | ORAL_TABLET | Freq: Every day | ORAL | 0 refills | Status: DC
Start: 2022-09-01 — End: 2022-09-11

## 2022-09-04 DIAGNOSIS — R260 Ataxic gait: Secondary | ICD-10-CM | POA: Diagnosis not present

## 2022-09-04 DIAGNOSIS — R296 Repeated falls: Secondary | ICD-10-CM | POA: Diagnosis not present

## 2022-09-04 DIAGNOSIS — R262 Difficulty in walking, not elsewhere classified: Secondary | ICD-10-CM | POA: Diagnosis not present

## 2022-09-04 DIAGNOSIS — Z9181 History of falling: Secondary | ICD-10-CM | POA: Diagnosis not present

## 2022-09-06 DIAGNOSIS — R260 Ataxic gait: Secondary | ICD-10-CM | POA: Diagnosis not present

## 2022-09-06 DIAGNOSIS — R262 Difficulty in walking, not elsewhere classified: Secondary | ICD-10-CM | POA: Diagnosis not present

## 2022-09-06 DIAGNOSIS — R296 Repeated falls: Secondary | ICD-10-CM | POA: Diagnosis not present

## 2022-09-06 DIAGNOSIS — Z9181 History of falling: Secondary | ICD-10-CM | POA: Diagnosis not present

## 2022-09-11 ENCOUNTER — Telehealth: Payer: Self-pay | Admitting: Family Medicine

## 2022-09-11 DIAGNOSIS — Z9181 History of falling: Secondary | ICD-10-CM | POA: Diagnosis not present

## 2022-09-11 DIAGNOSIS — R260 Ataxic gait: Secondary | ICD-10-CM | POA: Diagnosis not present

## 2022-09-11 DIAGNOSIS — R296 Repeated falls: Secondary | ICD-10-CM | POA: Diagnosis not present

## 2022-09-11 DIAGNOSIS — R262 Difficulty in walking, not elsewhere classified: Secondary | ICD-10-CM | POA: Diagnosis not present

## 2022-09-11 MED ORDER — MIRABEGRON ER 25 MG PO TB24: 25.0000 mg | ORAL_TABLET | Freq: Every day | ORAL | 1 refills | Status: DC

## 2022-09-11 NOTE — Telephone Encounter (Signed)
Patient's pharmacy informed us that Myrbetriq ER 25 mg is not covered by insurance and they stated the preferred alternative is Gemtesa and Myrbetriq tablets. Ok to send alternative?

## 2022-09-11 NOTE — Telephone Encounter (Signed)
Rx sent 

## 2022-09-11 NOTE — Telephone Encounter (Signed)
Pt saw dr [place] and was given samples of  mirabegron ER (MYRBETRIQ) 25 MG TB24 tablet  and would like new rx #[id number] send to  [id number] - [place], [place] - [address] Phone: [phone]  Fax: [phone]

## 2022-09-12 MED ORDER — GEMTESA 75 MG PO TABS: ORAL_TABLET | ORAL | 2 refills | Status: DC

## 2022-09-12 NOTE — Addendum Note (Signed)
Addended by: Christy Sartorius on: 09/12/2022 02:11 PM   Modules accepted: Orders

## 2022-09-12 NOTE — Telephone Encounter (Signed)
Patient's husband is aware of Mybertriq coverage and rx for [facility] sent

## 2022-09-13 DIAGNOSIS — R296 Repeated falls: Secondary | ICD-10-CM | POA: Diagnosis not present

## 2022-09-13 DIAGNOSIS — R260 Ataxic gait: Secondary | ICD-10-CM | POA: Diagnosis not present

## 2022-09-13 DIAGNOSIS — R262 Difficulty in walking, not elsewhere classified: Secondary | ICD-10-CM | POA: Diagnosis not present

## 2022-09-13 DIAGNOSIS — Z9181 History of falling: Secondary | ICD-10-CM | POA: Diagnosis not present

## 2022-09-18 DIAGNOSIS — Z9181 History of falling: Secondary | ICD-10-CM | POA: Diagnosis not present

## 2022-09-18 DIAGNOSIS — R262 Difficulty in walking, not elsewhere classified: Secondary | ICD-10-CM | POA: Diagnosis not present

## 2022-09-18 DIAGNOSIS — R296 Repeated falls: Secondary | ICD-10-CM | POA: Diagnosis not present

## 2022-09-18 DIAGNOSIS — R260 Ataxic gait: Secondary | ICD-10-CM | POA: Diagnosis not present

## 2022-09-20 DIAGNOSIS — Z9181 History of falling: Secondary | ICD-10-CM | POA: Diagnosis not present

## 2022-09-20 DIAGNOSIS — R260 Ataxic gait: Secondary | ICD-10-CM | POA: Diagnosis not present

## 2022-09-20 DIAGNOSIS — R262 Difficulty in walking, not elsewhere classified: Secondary | ICD-10-CM | POA: Diagnosis not present

## 2022-09-20 DIAGNOSIS — R296 Repeated falls: Secondary | ICD-10-CM | POA: Diagnosis not present

## 2022-09-25 DIAGNOSIS — Z9181 History of falling: Secondary | ICD-10-CM | POA: Diagnosis not present

## 2022-09-25 DIAGNOSIS — R262 Difficulty in walking, not elsewhere classified: Secondary | ICD-10-CM | POA: Diagnosis not present

## 2022-09-25 DIAGNOSIS — R296 Repeated falls: Secondary | ICD-10-CM | POA: Diagnosis not present

## 2022-09-25 DIAGNOSIS — R260 Ataxic gait: Secondary | ICD-10-CM | POA: Diagnosis not present

## 2022-09-26 ENCOUNTER — Ambulatory Visit (INDEPENDENT_AMBULATORY_CARE_PROVIDER_SITE_OTHER): Payer: Medicare Other

## 2022-09-26 VITALS — BP 120/60 | HR 75 | Temp 98.5°F | Ht 64.0 in | Wt 149.0 lb

## 2022-09-26 DIAGNOSIS — Z1211 Encounter for screening for malignant neoplasm of colon: Secondary | ICD-10-CM | POA: Diagnosis not present

## 2022-09-26 DIAGNOSIS — Z0000 Encounter for general adult medical examination without abnormal findings: Secondary | ICD-10-CM | POA: Diagnosis not present

## 2022-09-26 NOTE — Patient Instructions (Addendum)
[name] , Thank you for taking time to come for your Medicare Wellness Visit. I appreciate your ongoing commitment to your health goals. Please review the following plan we discussed and let me know if I can assist you in the future.   These are the goals we discussed:  Goals       DIET - EAT MORE FRUITS AND VEGETABLES (pt-stated)      Eat more fruits to get proper nutrition.      No current goals (pt-stated)        This is a list of the screening recommended for you and due dates:  Health Maintenance  Topic Date Due   COVID-19 Vaccine (1) 10/12/2022*   Mammogram  09/26/2023*   Colon Cancer Screening  09/26/2023*   Flu Shot  11/08/2022   Pneumonia Vaccine (3 of 3 - PPSV23 or PCV20) 08/11/2023   Medicare Annual Wellness Visit  09/26/2023   DTaP/Tdap/Td vaccine (3 - Td or Tdap) 10/10/2027   DEXA scan (bone density measurement)  Completed   Hepatitis C Screening  Completed   Zoster (Shingles) Vaccine  Completed   HPV Vaccine  Aged Out  *Topic was postponed. The date shown is not the original due date.    Advanced directives: In Chart  Conditions/risks identified: None  Next appointment: Follow up in one year for your annual wellness visit    Preventive Care 65 Years and Older, Female Preventive care refers to lifestyle choices and visits with your health care provider that can promote health and wellness. What does preventive care include? A yearly physical exam. This is also called an annual well check. Dental exams once or twice a year. Routine eye exams. Ask your health care provider how often you should have your eyes checked. Personal lifestyle choices, including: Daily care of your teeth and gums. Regular physical activity. Eating a healthy diet. Avoiding tobacco and drug use. Limiting alcohol use. Practicing safe sex. Taking low-dose aspirin every day. Taking vitamin and mineral supplements as recommended by your health care provider. What happens during an  annual well check? The services and screenings done by your health care provider during your annual well check will depend on your age, overall health, lifestyle risk factors, and family history of disease. Counseling  Your health care provider may ask you questions about your: Alcohol use. Tobacco use. Drug use. Emotional well-being. Home and relationship well-being. Sexual activity. Eating habits. History of falls. Memory and ability to understand (cognition). Work and work Astronomer. Reproductive health. Screening  You may have the following tests or measurements: Height, weight, and BMI. Blood pressure. Lipid and cholesterol levels. These may be checked every 5 years, or more frequently if you are over 82 years old. Skin check. Lung cancer screening. You may have this screening every year starting at age 58 if you have a 30-pack-year history of smoking and currently smoke or have quit within the past 15 years. Fecal occult blood test (FOBT) of the stool. You may have this test every year starting at age 66. Flexible sigmoidoscopy or colonoscopy. You may have a sigmoidoscopy every 5 years or a colonoscopy every 10 years starting at age 84. Hepatitis C blood test. Hepatitis B blood test. Sexually transmitted disease (STD) testing. Diabetes screening. This is done by checking your blood sugar (glucose) after you have not eaten for a while (fasting). You may have this done every 1-3 years. Bone density scan. This is done to screen for osteoporosis. You may have this  done starting at age 10. Mammogram. This may be done every 1-2 years. Talk to your health care provider about how often you should have regular mammograms. Talk with your health care provider about your test results, treatment options, and if necessary, the need for more tests. Vaccines  Your health care provider may recommend certain vaccines, such as: Influenza vaccine. This is recommended every year. Tetanus,  diphtheria, and acellular pertussis (Tdap, Td) vaccine. You may need a Td booster every 10 years. Zoster vaccine. You may need this after age 78. Pneumococcal 13-valent conjugate (PCV13) vaccine. One dose is recommended after age 22. Pneumococcal polysaccharide (PPSV23) vaccine. One dose is recommended after age 46. Talk to your health care provider about which screenings and vaccines you need and how often you need them. This information is not intended to replace advice given to you by your health care provider. Make sure you discuss any questions you have with your health care provider. Document Released: 04/22/2015 Document Revised: 12/14/2015 Document Reviewed: 01/25/2015 Elsevier Interactive Patient Education  2017 [place] Prevention in the Home Falls can cause injuries. They can happen to people of all ages. There are many things you can do to make your home safe and to help prevent falls. What can I do on the outside of my home? Regularly fix the edges of walkways and driveways and fix any cracks. Remove anything that might make you trip as you walk through a door, such as a raised step or threshold. Trim any bushes or trees on the path to your home. Use bright outdoor lighting. Clear any walking paths of anything that might make someone trip, such as rocks or tools. Regularly check to see if handrails are loose or broken. Make sure that both sides of any steps have handrails. Any raised decks and porches should have guardrails on the edges. Have any leaves, snow, or ice cleared regularly. Use sand or salt on walking paths during winter. Clean up any spills in your garage right away. This includes oil or grease spills. What can I do in the bathroom? Use night lights. Install grab bars by the toilet and in the tub and shower. Do not use towel bars as grab bars. Use non-skid mats or decals in the tub or shower. If you need to sit down in the shower, use a plastic,  non-slip stool. Keep the floor dry. Clean up any water that spills on the floor as soon as it happens. Remove soap buildup in the tub or shower regularly. Attach bath mats securely with double-sided non-slip rug tape. Do not have throw rugs and other things on the floor that can make you trip. What can I do in the bedroom? Use night lights. Make sure that you have a light by your bed that is easy to reach. Do not use any sheets or blankets that are too big for your bed. They should not hang down onto the floor. Have a firm chair that has side arms. You can use this for support while you get dressed. Do not have throw rugs and other things on the floor that can make you trip. What can I do in the kitchen? Clean up any spills right away. Avoid walking on wet floors. Keep items that you use a lot in easy-to-reach places. If you need to reach something above you, use a strong step stool that has a grab bar. Keep electrical cords out of the way. Do not use floor polish or wax  that makes floors slippery. If you must use wax, use non-skid floor wax. Do not have throw rugs and other things on the floor that can make you trip. What can I do with my stairs? Do not leave any items on the stairs. Make sure that there are handrails on both sides of the stairs and use them. Fix handrails that are broken or loose. Make sure that handrails are as long as the stairways. Check any carpeting to make sure that it is firmly attached to the stairs. Fix any carpet that is loose or worn. Avoid having throw rugs at the top or bottom of the stairs. If you do have throw rugs, attach them to the floor with carpet tape. Make sure that you have a light switch at the top of the stairs and the bottom of the stairs. If you do not have them, ask someone to add them for you. What else can I do to help prevent falls? Wear shoes that: Do not have high heels. Have rubber bottoms. Are comfortable and fit you well. Are closed  at the toe. Do not wear sandals. If you use a stepladder: Make sure that it is fully opened. Do not climb a closed stepladder. Make sure that both sides of the stepladder are locked into place. Ask someone to hold it for you, if possible. Clearly mark and make sure that you can see: Any grab bars or handrails. First and last steps. Where the edge of each step is. Use tools that help you move around (mobility aids) if they are needed. These include: Canes. Walkers. Scooters. Crutches. Turn on the lights when you go into a dark area. Replace any light bulbs as soon as they burn out. Set up your furniture so you have a clear path. Avoid moving your furniture around. If any of your floors are uneven, fix them. If there are any pets around you, be aware of where they are. Review your medicines with your doctor. Some medicines can make you feel dizzy. This can increase your chance of falling. Ask your doctor what other things that you can do to help prevent falls. This information is not intended to replace advice given to you by your health care provider. Make sure you discuss any questions you have with your health care provider. Document Released: 01/20/2009 Document Revised: 09/01/2015 Document Reviewed: 04/30/2014 Elsevier Interactive Patient Education  2017 [employer].

## 2022-09-26 NOTE — Progress Notes (Signed)
Subjective:   Natasha Frederick is a 67 y.o. female who presents for Medicare Annual (Subsequent) preventive examination.  Visit Complete: In person  Patient Medicare AWV questionnaire was completed by the patient on ; I have confirmed that all information answered by patient is correct and no changes since this date.  Review of Systems      Cardiac Risk Factors include: advanced age (>20men, >27 women)     Objective:    Today's Vitals   09/26/22 1318  BP: 120/60  Pulse: 75  Temp: 98.5 F (36.9 C)  TempSrc: Oral  SpO2: 91%  Weight: 149 lb (67.6 kg)  Height: 5\' 4"  (1.626 m)   Body mass index is 25.58 kg/m.     09/26/2022    1:38 PM 09/21/2021    2:21 PM 09/14/2020    1:15 PM 05/27/2019   11:24 AM 11/24/2018   11:16 AM 11/11/2018    1:14 PM 09/16/2018    1:50 PM  Advanced Directives  Does Patient Have a Medical Advance Directive? Yes Yes Yes Yes Yes Yes No  Type of Estate agent of [place];Living will Healthcare Power of [place];Living will Healthcare Power of [place];Living will Healthcare Power of [place];Living will Healthcare Power of [place];Living will Healthcare Power of [place];Living will   Does patient want to make changes to medical advance directive? No - Patient declined No - Patient declined  No - Patient declined No - Patient declined No - Patient declined   Copy of Healthcare Power of Attorney in Chart? Yes - validated most recent copy scanned in chart (See row information) No - copy requested  No - copy requested  No - copy requested   Would patient like information on creating a medical advance directive?       No - Patient declined    Current Medications (verified) Outpatient Encounter Medications as of 09/26/2022  Medication Sig   acetaminophen (TYLENOL) 500 MG tablet Take 1,000 mg by mouth every morning.   ciclopirox (LOPROX) 0.77 % cream Apply topically 2 (two) times daily.   ketotifen (ZADITOR) 0.025 % ophthalmic solution Place 1  drop into both eyes 2 (two) times daily.   risperiDONE (RISPERDAL) 0.5 MG tablet Take 0.5 mg by mouth 2 (two) times daily.   sennosides-docusate sodium (SENOKOT-S) 8.6-50 MG tablet Take 1 tablet by mouth daily.   sertraline (ZOLOFT) 100 MG tablet TAKE 1 TABLET(100 MG) BY MOUTH DAILY   Vibegron (GEMTESA) 75 MG TABS Take 1 tablet by mouth daily   No facility-administered encounter medications on file as of 09/26/2022.    Allergies (verified) Bee venom, Nsaids, Adhesive [tape], Codeine, Penicillins, and Sulfa antibiotics   History: Past Medical History:  Diagnosis Date   Acute embolism and thrombosis of deep vein of both distal lower extremities (HCC)    Arthritis    hip   C. difficile diarrhea    Endometriosis    Huntington disease (HCC)    Peptic ulcer    Perforated gastric ulcer (HCC)    Scoliosis    Past Surgical History:  Procedure Laterality Date   AMPUTATION Right 08/08/2018   Procedure: AMPUTATION BELOW KNEE RIGHT LOWER EXTREMITY;  Surgeon: Larina Earthly, MD;  Location: [facility];  Service: Vascular;  Laterality: Right;   ANTERIOR LAT LUMBAR FUSION Left 01/05/2014   Procedure: LUMBAR TWO TO THREE, LUMBAR LUMBAR THREE TO FOUR, ANTERIOR LATERAL LUMBAR FUSION 2 LEVELS;  Surgeon: Reinaldo Meeker, MD;  Location: [facility];  Service: Neurosurgery;  Laterality:  Left;   APPENDECTOMY  1963   BIOPSY  11/25/2018   Procedure: BIOPSY;  Surgeon: Sherrilyn Rist, MD;  Location: [facility];  Service: Gastroenterology;;   BREAST SURGERY Left 1987   bx   COLONOSCOPY W/ POLYPECTOMY     ESOPHAGOGASTRODUODENOSCOPY (EGD) WITH PROPOFOL N/A 11/25/2018   Procedure: ESOPHAGOGASTRODUODENOSCOPY (EGD) WITH PROPOFOL;  Surgeon: Sherrilyn Rist, MD;  Location: [facility];  Service: Gastroenterology;  Laterality: N/A;   EYE SURGERY Bilateral    FEMORAL-POPLITEAL BYPASS GRAFT Bilateral 08/06/2018   Procedure: BILATERAL POPLITEAL AND TIBIAL EMBOLECTOMIES, LEFT LEG FASCIOTOMY;  Surgeon: Larina Earthly,  MD;  Location: [facility];  Service: Vascular;  Laterality: Bilateral;   LAPAROSCOPY N/A 07/30/2018   Procedure: DIAGNOSTIC LAPAROSCOPY, Omentopexy gram patch, Washout of intra-abdominal abcesses x 3;  Surgeon: Karie Soda, MD;  Location: [facility];  Service: General;  Laterality: N/A;   OOPHORECTOMY Left 1977   Planters wart Left 2005   radial keratoto Bilateral    TONSILLECTOMY  1958   TOTAL HIP ARTHROPLASTY Left 07/19/2014   Procedure: TOTAL HIP ARTHROPLASTY;  Surgeon: Gean Birchwood, MD;  Location: [facility];  Service: Orthopedics;  Laterality: Left;   TUBAL LIGATION  1986   Family History  Problem Relation Age of Onset   Lung cancer Mother    Stroke Father    Huntington's disease Paternal Grandmother    Huntington's disease Paternal Aunt    Huntington's disease Paternal Uncle    Arthritis Neg Hx        family hx   Cancer Neg Hx        prostate ca -family hx   Heart disease Neg Hx        family hx   Colon cancer Neg Hx    Esophageal cancer Neg Hx    Pancreatic cancer Neg Hx    Liver disease Neg Hx    Social History   Socioeconomic History   Marital status: Married    Spouse name: Not on file   Number of children: Not on file   Years of education: Not on file   Highest education level: Not on file  Occupational History   Occupation: unemployed    Comment: disabled back pain  Tobacco Use   Smoking status: Former    Packs/day: 1.00    Years: 8.00    Additional pack years: 0.00    Total pack years: 8.00    Types: Cigarettes    Quit date: 01/17/1994    Years since quitting: 28.7   Smokeless tobacco: Never  Vaping Use   Vaping Use: Never used  Substance and Sexual Activity   Alcohol use: Yes    Alcohol/week: 3.0 standard drinks of alcohol    Types: 3 Cans of beer per week    Comment: per week   Drug use: No   Sexual activity: Not on file  Other Topics Concern   Not on file  Social History Narrative   Married   Right BKA    Disabled due to back problems       Social  Determinants of Health   Financial Resource Strain: Low Risk  (09/21/2021)   Overall Financial Resource Strain (CARDIA)    Difficulty of Paying Living Expenses: Not hard at all  Food Insecurity: No Food Insecurity (09/26/2022)   Hunger Vital Sign    Worried About Running Out of Food in the Last Year: Never true    Ran Out of Food in the Last Year: Never true  Transportation Needs: No Transportation Needs (09/26/2022)   PRAPARE - Administrator, Civil Service (Medical): No    Lack of Transportation (Non-Medical): No  Physical Activity: Insufficiently Active (09/26/2022)   Exercise Vital Sign    Days of Exercise per Week: 2 days    Minutes of Exercise per Session: 60 min  Stress: No Stress Concern Present (09/26/2022)   [employer] of Occupational Health - Occupational Stress Questionnaire    Feeling of Stress : Not at all  Social Connections: Moderately Isolated (09/26/2022)   Social Connection and Isolation Panel [NHANES]    Frequency of Communication with Friends and Family: More than three times a week    Frequency of Social Gatherings with Friends and Family: More than three times a week    Attends Religious Services: Never    Database administrator or Organizations: No    Attends Engineer, structural: Never    Marital Status: Married    Tobacco Counseling Counseling given: Not Answered   Clinical Intake:  Pre-visit preparation completed: Yes  Pain : No/denies pain     Nutritional Risks: None Diabetes: No  How often do you need to have someone help you when you read instructions, pamphlets, or other written materials from your doctor or pharmacy?: 1 - Never  Interpreter Needed?: No  Information entered by :: Belita Warsame LPN   Activities of Daily Living    09/26/2022    1:34 PM  In your present state of health, do you have any difficulty performing the following activities:  Hearing? 0  Vision? 0  Difficulty concentrating or making  decisions? 1  Comment Husband assist. Followed by Medical attention  Walking or climbing stairs? 1  Comment Uses a wheechair  Dressing or bathing? 1  Comment Husband assist  Doing errands, shopping? 1  Comment Husband assist  Preparing Food and eating ? Y  Comment Husband assist  Using the Toilet? Y  Comment Husband assist  In the past six months, have you accidently leaked urine? N  Do you have problems with loss of bowel control? N  Managing your Medications? Y  Comment Husband assist  Managing your Finances? Y  Comment Husband assist  Housekeeping or managing your Housekeeping? Y  Comment Husband assist    Patient Care Team: Kristian Covey, MD as PCP - General (Family Medicine) Aliene Beams, MD as Referring Physician (Neurosurgery) Tat, Octaviano Batty, DO as Consulting Physician (Neurology) Karie Soda, MD as Consulting Physician (General Surgery) Wynema Birch, MD as Consulting Physician (Neurology)  Indicate any recent Medical Services you may have received from other than [facility] providers in the past year (date may be approximate).     Assessment:   This is a routine wellness examination for Natasha Frederick.  Hearing/Vision screen Hearing Screening - Comments:: Denies hearing difficulties   Vision Screening - Comments:: Wears rx glasses - up to date with routine eye exams with  Dr Emily Filbert  Dietary issues and exercise activities discussed:     Goals Addressed               This Visit's Progress     No current goals (pt-stated)         Depression Screen    09/26/2022    1:33 PM 07/31/2022    2:31 PM 01/10/2022    2:33 PM 11/06/2021    3:07 PM 10/03/2021    5:00 PM 09/21/2021    2:13 PM 06/14/2021    3:44 PM  PHQ 2/9 Scores  PHQ - 2 Score 0 0 0 0  0 0  PHQ- 9 Score 0 4 4 0   3  Exception Documentation     Patient refusal      Fall Risk    09/26/2022    1:38 PM 07/31/2022    2:31 PM 01/10/2022    2:33 PM 09/21/2021    2:19 PM 08/01/2021   10:33 AM  Fall  Risk   Falls in the past year? 1 1 1 1 1   Number falls in past yr: 0 1 1 0 0  Injury with Fall? 0 0 0 0 0  Comment    No injury or medical attention needed   Risk for fall due to : No Fall Risks Impaired balance/gait;Impaired mobility;History of fall(s) Impaired balance/gait;Impaired mobility No Fall Risks No Fall Risks  Follow up Falls prevention discussed Falls evaluation completed Falls evaluation completed  Falls evaluation completed    MEDICARE RISK AT HOME:  Medicare Risk at Home - 09/26/22 1347     Any stairs in or around the home? Yes    If so, are there any without handrails? No    Home free of loose throw rugs in walkways, pet beds, electrical cords, etc? Yes    Adequate lighting in your home to reduce risk of falls? Yes    Life alert? No    Use of a cane, walker or w/c? Yes    Grab bars in the bathroom? No    Shower chair or bench in shower? Yes    Elevated toilet seat or a handicapped toilet? Yes             TIMED UP AND GO:  Was the test performed?  No  WC   Cognitive Function:    10/09/2017   10:16 AM  MMSE - Mini Mental State Exam  Orientation to time 5  Orientation to Place 5  Registration 3  Attention/ Calculation 5  Recall 3  Language- name 2 objects 2  Language- repeat 1  Language- follow 3 step command 3  Language- read & follow direction 1  Write a sentence 1  Copy design 0  Total score 29        09/26/2022    1:38 PM 09/21/2021    2:21 PM 05/27/2019   11:30 AM  6CIT Screen  What Year? 0 points 0 points 0 points  What month? 3 points 3 points 0 points  What time? 0 points 0 points 0 points  Count back from 20 0 points 0 points 0 points  Months in reverse 4 points 4 points 0 points  Repeat phrase 8 points 0 points 0 points  Total Score 15 points 7 points 0 points    Immunizations Immunization History  Administered Date(s) Administered   Fluad Quad(high Dose 65+) 12/19/2018, 01/18/2021   HIB (PRP-T) 08/12/2018   Influenza Split  01/31/2011   Influenza, High Dose Seasonal PF 12/17/2016   Influenza,inj,Quad PF,6+ Mos 03/10/2013, 03/23/2014   Influenza-Unspecified 12/01/2015   Meningococcal Mcv4o 08/11/2018   Pneumococcal Conjugate-13 08/11/2018   Pneumococcal Polysaccharide-23 02/07/2006   Td 04/09/2002   Tdap 10/09/2017   Zoster Recombinat (Shingrix) 10/14/2017, 12/22/2017   Zoster, Live 01/31/2011    TDAP status: Up to date  Flu Vaccine status: Up to date  Pneumococcal vaccine status: Up to date  Covid-19 vaccine status: Completed vaccines  Qualifies for Shingles Vaccine? Yes   Zostavax completed Yes   Shingrix Completed?: Yes  Screening Tests Health Maintenance  Topic Date Due   COVID-19 Vaccine (1) 10/12/2022 (Originally 05/13/1960)   MAMMOGRAM  09/26/2023 (Originally 05/14/1971)   Colonoscopy  09/26/2023 (Originally 01/07/2013)   INFLUENZA VACCINE  11/08/2022   Pneumonia Vaccine 74+ Years old (3 of 3 - PPSV23 or PCV20) 08/11/2023   Medicare Annual Wellness (AWV)  09/26/2023   DTaP/Tdap/Td (3 - Td or Tdap) 10/10/2027   DEXA SCAN  Completed   Hepatitis C Screening  Completed   Zoster Vaccines- Shingrix  Completed   HPV VACCINES  Aged Out    Health Maintenance  There are no preventive care reminders to display for this patient.   Colorectal cancer screening: Referral to GI placed  . Pt aware the office will call re: appt.  Mammogram status: Ordered Deferred. Pt provided with contact info and advised to call to schedule appt.   Bone Density status: Completed 11/07/21. Results reflect: Bone density results: OSTEOPOROSIS. Repeat every   years.  Lung Cancer Screening: (Low Dose CT Chest recommended if Age 13-80 years, 20 pack-year currently smoking OR have quit w/in 15years.) does not qualify.     Additional Screening:  Hepatitis C Screening: does qualify; Completed 10/09/17  Vision Screening: Recommended annual ophthalmology exams for early detection of glaucoma and other disorders of the  eye. Is the patient up to date with their annual eye exam?  Yes  Who is the provider or what is the name of the office in which the patient attends annual eye exams? Dr Emily Filbert If pt is not established with a provider, would they like to be referred to a provider to establish care? No .   Dental Screening: Recommended annual dental exams for proper oral hygiene   Community Resource Referral / Chronic Care Management:  CRR required this visit?  No   CCM required this visit?  No     Plan:     I have personally reviewed and noted the following in the patient's chart:   Medical and social history Use of alcohol, tobacco or illicit drugs  Current medications and supplements including opioid prescriptions. Patient is not currently taking opioid prescriptions. Functional ability and status Nutritional status Physical activity Advanced directives List of other physicians Hospitalizations, surgeries, and ER visits in previous 12 months Vitals Screenings to include cognitive, depression, and falls Referrals and appointments  In addition, I have reviewed and discussed with patient certain preventive protocols, quality metrics, and best practice recommendations. A written personalized care plan for preventive services as well as general preventive health recommendations were provided to patient.     Tillie Rung, LPN   07/16/8117   After Visit Summary: Given  Nurse Notes:   None

## 2022-09-27 DIAGNOSIS — R296 Repeated falls: Secondary | ICD-10-CM | POA: Diagnosis not present

## 2022-09-27 DIAGNOSIS — R262 Difficulty in walking, not elsewhere classified: Secondary | ICD-10-CM | POA: Diagnosis not present

## 2022-09-27 DIAGNOSIS — R260 Ataxic gait: Secondary | ICD-10-CM | POA: Diagnosis not present

## 2022-09-27 DIAGNOSIS — Z9181 History of falling: Secondary | ICD-10-CM | POA: Diagnosis not present

## 2022-10-09 DIAGNOSIS — R262 Difficulty in walking, not elsewhere classified: Secondary | ICD-10-CM | POA: Diagnosis not present

## 2022-10-09 DIAGNOSIS — R296 Repeated falls: Secondary | ICD-10-CM | POA: Diagnosis not present

## 2022-10-09 DIAGNOSIS — Z9181 History of falling: Secondary | ICD-10-CM | POA: Diagnosis not present

## 2022-10-09 DIAGNOSIS — R260 Ataxic gait: Secondary | ICD-10-CM | POA: Diagnosis not present

## 2022-10-16 DIAGNOSIS — Z9181 History of falling: Secondary | ICD-10-CM | POA: Diagnosis not present

## 2022-10-16 DIAGNOSIS — R260 Ataxic gait: Secondary | ICD-10-CM | POA: Diagnosis not present

## 2022-10-16 DIAGNOSIS — R262 Difficulty in walking, not elsewhere classified: Secondary | ICD-10-CM | POA: Diagnosis not present

## 2022-10-16 DIAGNOSIS — R296 Repeated falls: Secondary | ICD-10-CM | POA: Diagnosis not present

## 2022-10-18 DIAGNOSIS — R260 Ataxic gait: Secondary | ICD-10-CM | POA: Diagnosis not present

## 2022-10-18 DIAGNOSIS — R262 Difficulty in walking, not elsewhere classified: Secondary | ICD-10-CM | POA: Diagnosis not present

## 2022-10-18 DIAGNOSIS — R296 Repeated falls: Secondary | ICD-10-CM | POA: Diagnosis not present

## 2022-10-18 DIAGNOSIS — Z9181 History of falling: Secondary | ICD-10-CM | POA: Diagnosis not present

## 2022-10-23 DIAGNOSIS — R260 Ataxic gait: Secondary | ICD-10-CM | POA: Diagnosis not present

## 2022-10-23 DIAGNOSIS — Z9181 History of falling: Secondary | ICD-10-CM | POA: Diagnosis not present

## 2022-10-23 DIAGNOSIS — R262 Difficulty in walking, not elsewhere classified: Secondary | ICD-10-CM | POA: Diagnosis not present

## 2022-10-23 DIAGNOSIS — R296 Repeated falls: Secondary | ICD-10-CM | POA: Diagnosis not present

## 2022-10-23 NOTE — Progress Notes (Signed)
HPI:  [name] is a 67 y.o. female with PMHx significant for Huntington disease, PUD,GERD, depression,and OA here today with her husband for follow up.  I saw her on 08/31/22 for persistent urinary frequency, which did not resolved after appropriate abx treatment (Macrobid and Cephalexin).  Myrbetriq 25 mg was started, which helped but it was not covered by her health insurance. Her PCP, Dr Natasha Frederick, changed medication to Vibegron 75 mg, which she has tolerated well with no side effects.. She reports great improvement of urinary frequency. Denies dysuria,gross hematuria,or decreased urine output.  Noted cough during visit, states that she has had productive cough for about 1-2 weeks. No associated fever,chills,sore throat, SOB,wheezing,or CP.  Review of Systems  Constitutional:  Negative for activity change and appetite change.  HENT:  Negative for mouth sores and sore throat.   Gastrointestinal:  Negative for abdominal pain, nausea and vomiting.  Skin:  Negative for rash.  Neurological:  Negative for syncope and headaches.  See other pertinent positives and negatives in HPI.  Current Outpatient Medications on File Prior to Visit  Medication Sig Dispense Refill   acetaminophen (TYLENOL) 500 MG tablet Take 1,000 mg by mouth every morning.     ciclopirox (LOPROX) 0.77 % cream Apply topically 2 (two) times daily. 15 g 1   ketotifen (ZADITOR) 0.025 % ophthalmic solution Place 1 drop into both eyes 2 (two) times daily. 5 mL 0   risperiDONE (RISPERDAL) 0.5 MG tablet Take 0.5 mg by mouth 2 (two) times daily.     sennosides-docusate sodium (SENOKOT-S) 8.6-50 MG tablet Take 1 tablet by mouth daily.     sertraline (ZOLOFT) 100 MG tablet TAKE 1 TABLET(100 MG) BY MOUTH DAILY 90 tablet 1   No current facility-administered medications on file prior to visit.   Past Medical History:  Diagnosis Date   Acute embolism and thrombosis of deep vein of both distal lower extremities (HCC)     Arthritis    hip   C. difficile diarrhea    Endometriosis    Huntington disease (HCC)    Peptic ulcer    Perforated gastric ulcer (HCC)    Scoliosis    Allergies  Allergen Reactions   Bee Venom Anaphylaxis and Swelling   Nsaids Other (See Comments)    PERFORATED ULCER = NO MORE NSAIDs   Adhesive [Tape] Other (See Comments)    redness   Codeine Nausea And Vomiting   Penicillins     Did it involve swelling of the face/tongue/throat, SOB, or low BP? Yes Did it involve sudden or severe rash/hives, skin peeling, or any reaction on the inside of your mouth or nose? Unknown Did you need to seek medical attention at a hospital or doctor's office? Yes When did it last happen?   age 38-40    If all above answers are "NO", may proceed with cephalosporin use.    Sulfa Antibiotics Itching    Social History   Socioeconomic History   Marital status: Married    Spouse name: Not on file   Number of children: Not on file   Years of education: Not on file   Highest education level: Not on file  Occupational History   Occupation: unemployed    Comment: disabled back pain  Tobacco Use   Smoking status: Former    Current packs/day: 0.00    Average packs/day: 1 pack/day for 8.0 years (8.0 ttl pk-yrs)    Types: Cigarettes    Start date: 01/17/1986    Quit  date: 01/17/1994    Years since quitting: 28.7   Smokeless tobacco: Frederick  Vaping Use   Vaping status: Frederick Used  Substance and Sexual Activity   Alcohol use: Yes    Alcohol/week: 3.0 standard drinks of alcohol    Types: 3 Cans of beer per week    Comment: per week   Drug use: No   Sexual activity: Not on file  Other Topics Concern   Not on file  Social History Narrative   Married   Right BKA    Disabled due to back problems       Social Determinants of Health   Financial Resource Strain: Low Risk  (09/21/2021)   Overall Financial Resource Strain (CARDIA)    Difficulty of Paying Living Expenses: Not hard at all  Food  Insecurity: No Food Insecurity (09/26/2022)   Hunger Vital Sign    Worried About Running Out of Food in the Last Year: Frederick true    Ran Out of Food in the Last Year: Frederick true  Transportation Needs: No Transportation Needs (09/26/2022)   PRAPARE - Administrator, Civil Service (Medical): No    Lack of Transportation (Non-Medical): No  Physical Activity: Insufficiently Active (09/26/2022)   Exercise Vital Sign    Days of Exercise per Week: 2 days    Minutes of Exercise per Session: 60 min  Stress: No Stress Concern Present (09/26/2022)   [employer] of Occupational Health - Occupational Stress Questionnaire    Feeling of Stress : Not at all  Social Connections: Moderately Isolated (09/26/2022)   Social Connection and Isolation Panel [NHANES]    Frequency of Communication with Friends and Family: More than three times a week    Frequency of Social Gatherings with Friends and Family: More than three times a week    Attends Religious Services: Frederick    Database administrator or Organizations: No    Attends Banker Meetings: Frederick    Marital Status: Married   Vitals:   10/24/22 1103  BP: 120/70  Pulse: 70  Resp: 16  SpO2: 94%   Body mass index is 25.58 kg/m.  Physical Exam Vitals and nursing note reviewed.  Constitutional:      General: She is not in acute distress.    Appearance: She is well-developed.  HENT:     Head: Normocephalic and atraumatic.  Eyes:     Conjunctiva/sclera: Conjunctivae normal.  Cardiovascular:     Rate and Rhythm: Normal rate and regular rhythm.     Heart sounds: No murmur heard. Pulmonary:     Effort: Pulmonary effort is normal. No respiratory distress.     Breath sounds: Normal breath sounds.  Abdominal:     Palpations: Abdomen is soft.     Tenderness: There is no abdominal tenderness.  Skin:    General: Skin is warm.     Findings: No erythema.  Neurological:     Mental Status: She is alert and oriented to person,  place, and time.     Comments: RUE abnormal tone, contractures. She is in a wheel chair.  Psychiatric:        Mood and Affect: Mood and affect normal.   ASSESSMENT AND PLAN:  [name] was seen today for follow-up.  Diagnoses and all orders for this visit:  Urinary frequency No other associated symptoms and reporting improvement with Vibegron, so no changes. Monitor for new symptoms. F/U with PCP in 4 months.  -  Vibegron (GEMTESA) 75 MG TABS; Take 1 tablet by mouth daily  Subacute cough No associated symptoms. Lung auscultation negative. We do not have radiology service due to [employer] problems. Instructed about warning signs. If not resolved in a couple weeks, CXR can be arranged.  She and her husband voice understanding and agree with plan.  Return in about 4 months (around 02/24/2023) for chronic problems with PCP.  Dayson Aboud G. [place], MD  [facility]. [place] office.

## 2022-10-24 ENCOUNTER — Ambulatory Visit (INDEPENDENT_AMBULATORY_CARE_PROVIDER_SITE_OTHER): Payer: Medicare Other | Admitting: Family Medicine

## 2022-10-24 ENCOUNTER — Encounter: Payer: Self-pay | Admitting: Family Medicine

## 2022-10-24 VITALS — BP 120/70 | HR 70 | Resp 16 | Ht 64.0 in

## 2022-10-24 DIAGNOSIS — R052 Subacute cough: Secondary | ICD-10-CM

## 2022-10-24 DIAGNOSIS — R350 Frequency of micturition: Secondary | ICD-10-CM | POA: Diagnosis not present

## 2022-10-24 MED ORDER — GEMTESA 75 MG PO TABS: ORAL_TABLET | ORAL | 1 refills | Status: DC

## 2022-10-24 NOTE — Patient Instructions (Signed)
A few things to remember from today's visit:  Urinary frequency  Subacute cough No changes today. Monitor for new symptoms associated with cough. If not any better in a couple weeks we need to consdier imaging.  If you need refills for medications you take chronically, please call your pharmacy. Do not use My Chart to request refills or for acute issues that need immediate attention. If you send a my chart message, it may take a few days to be addressed, specially if I am not in the office.  Please be sure medication list is accurate. If a new problem present, please set up appointment sooner than planned today.

## 2022-10-25 DIAGNOSIS — R262 Difficulty in walking, not elsewhere classified: Secondary | ICD-10-CM | POA: Diagnosis not present

## 2022-10-25 DIAGNOSIS — R260 Ataxic gait: Secondary | ICD-10-CM | POA: Diagnosis not present

## 2022-10-25 DIAGNOSIS — R296 Repeated falls: Secondary | ICD-10-CM | POA: Diagnosis not present

## 2022-10-25 DIAGNOSIS — Z9181 History of falling: Secondary | ICD-10-CM | POA: Diagnosis not present

## 2022-10-26 ENCOUNTER — Telehealth: Payer: Self-pay | Admitting: Family Medicine

## 2022-10-26 NOTE — Telephone Encounter (Signed)
Patient dropped off document Handicap Placard, to be filled out by provider. Patient requested to send it back via Call Patient to pick up within 7-days. Document is located in providers tray at front office.Please advise at [facility] [phone]

## 2022-10-27 ENCOUNTER — Other Ambulatory Visit: Payer: Self-pay | Admitting: Family Medicine

## 2022-10-30 DIAGNOSIS — Z9181 History of falling: Secondary | ICD-10-CM | POA: Diagnosis not present

## 2022-10-30 DIAGNOSIS — R262 Difficulty in walking, not elsewhere classified: Secondary | ICD-10-CM | POA: Diagnosis not present

## 2022-10-30 DIAGNOSIS — R296 Repeated falls: Secondary | ICD-10-CM | POA: Diagnosis not present

## 2022-10-30 DIAGNOSIS — R260 Ataxic gait: Secondary | ICD-10-CM | POA: Diagnosis not present

## 2022-11-13 DIAGNOSIS — R262 Difficulty in walking, not elsewhere classified: Secondary | ICD-10-CM | POA: Diagnosis not present

## 2022-11-13 DIAGNOSIS — R260 Ataxic gait: Secondary | ICD-10-CM | POA: Diagnosis not present

## 2022-11-13 DIAGNOSIS — Z9181 History of falling: Secondary | ICD-10-CM | POA: Diagnosis not present

## 2022-11-13 DIAGNOSIS — R296 Repeated falls: Secondary | ICD-10-CM | POA: Diagnosis not present

## 2022-11-15 DIAGNOSIS — R262 Difficulty in walking, not elsewhere classified: Secondary | ICD-10-CM | POA: Diagnosis not present

## 2022-11-15 DIAGNOSIS — R260 Ataxic gait: Secondary | ICD-10-CM | POA: Diagnosis not present

## 2022-11-15 DIAGNOSIS — Z9181 History of falling: Secondary | ICD-10-CM | POA: Diagnosis not present

## 2022-11-15 DIAGNOSIS — R296 Repeated falls: Secondary | ICD-10-CM | POA: Diagnosis not present

## 2022-11-20 DIAGNOSIS — R296 Repeated falls: Secondary | ICD-10-CM | POA: Diagnosis not present

## 2022-11-20 DIAGNOSIS — R260 Ataxic gait: Secondary | ICD-10-CM | POA: Diagnosis not present

## 2022-11-20 DIAGNOSIS — Z9181 History of falling: Secondary | ICD-10-CM | POA: Diagnosis not present

## 2022-11-20 DIAGNOSIS — R262 Difficulty in walking, not elsewhere classified: Secondary | ICD-10-CM | POA: Diagnosis not present

## 2022-11-21 ENCOUNTER — Telehealth: Payer: Self-pay | Admitting: Family Medicine

## 2022-11-21 NOTE — Telephone Encounter (Signed)
Pt husband is calling and needs on our letterhead that his wife is unable to perform bathing, dressing and toiletry without his assistant attn  [employer]

## 2022-11-22 ENCOUNTER — Encounter: Payer: Self-pay | Admitting: Family Medicine

## 2022-11-22 DIAGNOSIS — R260 Ataxic gait: Secondary | ICD-10-CM | POA: Diagnosis not present

## 2022-11-22 DIAGNOSIS — R262 Difficulty in walking, not elsewhere classified: Secondary | ICD-10-CM | POA: Diagnosis not present

## 2022-11-22 DIAGNOSIS — R296 Repeated falls: Secondary | ICD-10-CM | POA: Diagnosis not present

## 2022-11-22 DIAGNOSIS — Z9181 History of falling: Secondary | ICD-10-CM | POA: Diagnosis not present

## 2022-11-23 NOTE — Telephone Encounter (Signed)
ATC but could not reach at this time

## 2022-11-23 NOTE — Telephone Encounter (Signed)
Forms placed in front office for pickup. Patient aware

## 2022-11-27 DIAGNOSIS — R262 Difficulty in walking, not elsewhere classified: Secondary | ICD-10-CM | POA: Diagnosis not present

## 2022-11-27 DIAGNOSIS — Z9181 History of falling: Secondary | ICD-10-CM | POA: Diagnosis not present

## 2022-11-27 DIAGNOSIS — R296 Repeated falls: Secondary | ICD-10-CM | POA: Diagnosis not present

## 2022-11-27 DIAGNOSIS — R260 Ataxic gait: Secondary | ICD-10-CM | POA: Diagnosis not present

## 2022-11-29 DIAGNOSIS — R296 Repeated falls: Secondary | ICD-10-CM | POA: Diagnosis not present

## 2022-11-29 DIAGNOSIS — R262 Difficulty in walking, not elsewhere classified: Secondary | ICD-10-CM | POA: Diagnosis not present

## 2022-11-29 DIAGNOSIS — R260 Ataxic gait: Secondary | ICD-10-CM | POA: Diagnosis not present

## 2022-11-29 DIAGNOSIS — Z9181 History of falling: Secondary | ICD-10-CM | POA: Diagnosis not present

## 2022-12-04 ENCOUNTER — Encounter: Payer: Self-pay | Admitting: Family Medicine

## 2022-12-04 DIAGNOSIS — R296 Repeated falls: Secondary | ICD-10-CM | POA: Diagnosis not present

## 2022-12-04 DIAGNOSIS — R262 Difficulty in walking, not elsewhere classified: Secondary | ICD-10-CM | POA: Diagnosis not present

## 2022-12-04 DIAGNOSIS — Z9181 History of falling: Secondary | ICD-10-CM | POA: Diagnosis not present

## 2022-12-04 DIAGNOSIS — R260 Ataxic gait: Secondary | ICD-10-CM | POA: Diagnosis not present

## 2022-12-06 DIAGNOSIS — R260 Ataxic gait: Secondary | ICD-10-CM | POA: Diagnosis not present

## 2022-12-06 DIAGNOSIS — R262 Difficulty in walking, not elsewhere classified: Secondary | ICD-10-CM | POA: Diagnosis not present

## 2022-12-06 DIAGNOSIS — R296 Repeated falls: Secondary | ICD-10-CM | POA: Diagnosis not present

## 2022-12-06 DIAGNOSIS — Z9181 History of falling: Secondary | ICD-10-CM | POA: Diagnosis not present

## 2022-12-12 ENCOUNTER — Encounter: Payer: Self-pay | Admitting: Nurse Practitioner

## 2022-12-13 DIAGNOSIS — R262 Difficulty in walking, not elsewhere classified: Secondary | ICD-10-CM | POA: Diagnosis not present

## 2022-12-13 DIAGNOSIS — R260 Ataxic gait: Secondary | ICD-10-CM | POA: Diagnosis not present

## 2022-12-13 DIAGNOSIS — R296 Repeated falls: Secondary | ICD-10-CM | POA: Diagnosis not present

## 2022-12-13 DIAGNOSIS — Z9181 History of falling: Secondary | ICD-10-CM | POA: Diagnosis not present

## 2022-12-18 ENCOUNTER — Ambulatory Visit (INDEPENDENT_AMBULATORY_CARE_PROVIDER_SITE_OTHER): Payer: Medicare Other

## 2022-12-18 DIAGNOSIS — R262 Difficulty in walking, not elsewhere classified: Secondary | ICD-10-CM | POA: Diagnosis not present

## 2022-12-18 DIAGNOSIS — Z9181 History of falling: Secondary | ICD-10-CM | POA: Diagnosis not present

## 2022-12-18 DIAGNOSIS — R296 Repeated falls: Secondary | ICD-10-CM | POA: Diagnosis not present

## 2022-12-18 DIAGNOSIS — R260 Ataxic gait: Secondary | ICD-10-CM | POA: Diagnosis not present

## 2022-12-18 DIAGNOSIS — Z23 Encounter for immunization: Secondary | ICD-10-CM

## 2022-12-18 NOTE — Addendum Note (Signed)
Addended by: Philipp Deputy A on: 12/18/2022 01:46 PM   Modules accepted: Orders

## 2022-12-18 NOTE — Progress Notes (Signed)
Per orders of Dr. Caryl Never, injection of Flu Vaccine  given by Stann Ore. Patient tolerated injection well.

## 2022-12-20 DIAGNOSIS — R262 Difficulty in walking, not elsewhere classified: Secondary | ICD-10-CM | POA: Diagnosis not present

## 2022-12-20 DIAGNOSIS — Z9181 History of falling: Secondary | ICD-10-CM | POA: Diagnosis not present

## 2022-12-20 DIAGNOSIS — R260 Ataxic gait: Secondary | ICD-10-CM | POA: Diagnosis not present

## 2022-12-20 DIAGNOSIS — R296 Repeated falls: Secondary | ICD-10-CM | POA: Diagnosis not present

## 2022-12-25 DIAGNOSIS — R262 Difficulty in walking, not elsewhere classified: Secondary | ICD-10-CM | POA: Diagnosis not present

## 2022-12-25 DIAGNOSIS — R260 Ataxic gait: Secondary | ICD-10-CM | POA: Diagnosis not present

## 2022-12-25 DIAGNOSIS — Z9181 History of falling: Secondary | ICD-10-CM | POA: Diagnosis not present

## 2022-12-25 DIAGNOSIS — R296 Repeated falls: Secondary | ICD-10-CM | POA: Diagnosis not present

## 2022-12-27 DIAGNOSIS — R296 Repeated falls: Secondary | ICD-10-CM | POA: Diagnosis not present

## 2022-12-27 DIAGNOSIS — Z9181 History of falling: Secondary | ICD-10-CM | POA: Diagnosis not present

## 2022-12-27 DIAGNOSIS — R262 Difficulty in walking, not elsewhere classified: Secondary | ICD-10-CM | POA: Diagnosis not present

## 2022-12-27 DIAGNOSIS — R260 Ataxic gait: Secondary | ICD-10-CM | POA: Diagnosis not present

## 2023-01-01 DIAGNOSIS — R260 Ataxic gait: Secondary | ICD-10-CM | POA: Diagnosis not present

## 2023-01-01 DIAGNOSIS — R262 Difficulty in walking, not elsewhere classified: Secondary | ICD-10-CM | POA: Diagnosis not present

## 2023-01-01 DIAGNOSIS — R296 Repeated falls: Secondary | ICD-10-CM | POA: Diagnosis not present

## 2023-01-01 DIAGNOSIS — Z9181 History of falling: Secondary | ICD-10-CM | POA: Diagnosis not present

## 2023-01-03 DIAGNOSIS — R296 Repeated falls: Secondary | ICD-10-CM | POA: Diagnosis not present

## 2023-01-03 DIAGNOSIS — R262 Difficulty in walking, not elsewhere classified: Secondary | ICD-10-CM | POA: Diagnosis not present

## 2023-01-03 DIAGNOSIS — R260 Ataxic gait: Secondary | ICD-10-CM | POA: Diagnosis not present

## 2023-01-03 DIAGNOSIS — Z9181 History of falling: Secondary | ICD-10-CM | POA: Diagnosis not present

## 2023-01-08 DIAGNOSIS — R262 Difficulty in walking, not elsewhere classified: Secondary | ICD-10-CM | POA: Diagnosis not present

## 2023-01-08 DIAGNOSIS — Z9181 History of falling: Secondary | ICD-10-CM | POA: Diagnosis not present

## 2023-01-08 DIAGNOSIS — R296 Repeated falls: Secondary | ICD-10-CM | POA: Diagnosis not present

## 2023-01-08 DIAGNOSIS — R260 Ataxic gait: Secondary | ICD-10-CM | POA: Diagnosis not present

## 2023-01-09 DIAGNOSIS — H16141 Punctate keratitis, right eye: Secondary | ICD-10-CM | POA: Diagnosis not present

## 2023-01-09 DIAGNOSIS — H2513 Age-related nuclear cataract, bilateral: Secondary | ICD-10-CM | POA: Diagnosis not present

## 2023-01-11 DIAGNOSIS — Z9181 History of falling: Secondary | ICD-10-CM | POA: Diagnosis not present

## 2023-01-11 DIAGNOSIS — R260 Ataxic gait: Secondary | ICD-10-CM | POA: Diagnosis not present

## 2023-01-11 DIAGNOSIS — R262 Difficulty in walking, not elsewhere classified: Secondary | ICD-10-CM | POA: Diagnosis not present

## 2023-01-11 DIAGNOSIS — R296 Repeated falls: Secondary | ICD-10-CM | POA: Diagnosis not present

## 2023-01-15 DIAGNOSIS — R296 Repeated falls: Secondary | ICD-10-CM | POA: Diagnosis not present

## 2023-01-15 DIAGNOSIS — R262 Difficulty in walking, not elsewhere classified: Secondary | ICD-10-CM | POA: Diagnosis not present

## 2023-01-15 DIAGNOSIS — Z9181 History of falling: Secondary | ICD-10-CM | POA: Diagnosis not present

## 2023-01-15 DIAGNOSIS — R260 Ataxic gait: Secondary | ICD-10-CM | POA: Diagnosis not present

## 2023-01-17 DIAGNOSIS — R296 Repeated falls: Secondary | ICD-10-CM | POA: Diagnosis not present

## 2023-01-17 DIAGNOSIS — R262 Difficulty in walking, not elsewhere classified: Secondary | ICD-10-CM | POA: Diagnosis not present

## 2023-01-17 DIAGNOSIS — R260 Ataxic gait: Secondary | ICD-10-CM | POA: Diagnosis not present

## 2023-01-17 DIAGNOSIS — Z9181 History of falling: Secondary | ICD-10-CM | POA: Diagnosis not present

## 2023-01-22 DIAGNOSIS — Z9181 History of falling: Secondary | ICD-10-CM | POA: Diagnosis not present

## 2023-01-22 DIAGNOSIS — R260 Ataxic gait: Secondary | ICD-10-CM | POA: Diagnosis not present

## 2023-01-22 DIAGNOSIS — R262 Difficulty in walking, not elsewhere classified: Secondary | ICD-10-CM | POA: Diagnosis not present

## 2023-01-22 DIAGNOSIS — R296 Repeated falls: Secondary | ICD-10-CM | POA: Diagnosis not present

## 2023-01-24 DIAGNOSIS — R296 Repeated falls: Secondary | ICD-10-CM | POA: Diagnosis not present

## 2023-01-24 DIAGNOSIS — Z9181 History of falling: Secondary | ICD-10-CM | POA: Diagnosis not present

## 2023-01-24 DIAGNOSIS — R260 Ataxic gait: Secondary | ICD-10-CM | POA: Diagnosis not present

## 2023-01-24 DIAGNOSIS — R262 Difficulty in walking, not elsewhere classified: Secondary | ICD-10-CM | POA: Diagnosis not present

## 2023-01-29 DIAGNOSIS — R260 Ataxic gait: Secondary | ICD-10-CM | POA: Diagnosis not present

## 2023-01-29 DIAGNOSIS — R296 Repeated falls: Secondary | ICD-10-CM | POA: Diagnosis not present

## 2023-01-29 DIAGNOSIS — R262 Difficulty in walking, not elsewhere classified: Secondary | ICD-10-CM | POA: Diagnosis not present

## 2023-01-29 DIAGNOSIS — Z9181 History of falling: Secondary | ICD-10-CM | POA: Diagnosis not present

## 2023-01-31 DIAGNOSIS — R260 Ataxic gait: Secondary | ICD-10-CM | POA: Diagnosis not present

## 2023-01-31 DIAGNOSIS — R296 Repeated falls: Secondary | ICD-10-CM | POA: Diagnosis not present

## 2023-01-31 DIAGNOSIS — Z9181 History of falling: Secondary | ICD-10-CM | POA: Diagnosis not present

## 2023-01-31 DIAGNOSIS — R262 Difficulty in walking, not elsewhere classified: Secondary | ICD-10-CM | POA: Diagnosis not present

## 2023-02-04 ENCOUNTER — Other Ambulatory Visit: Payer: Self-pay | Admitting: Family Medicine

## 2023-02-05 DIAGNOSIS — R296 Repeated falls: Secondary | ICD-10-CM | POA: Diagnosis not present

## 2023-02-05 DIAGNOSIS — R260 Ataxic gait: Secondary | ICD-10-CM | POA: Diagnosis not present

## 2023-02-05 DIAGNOSIS — R262 Difficulty in walking, not elsewhere classified: Secondary | ICD-10-CM | POA: Diagnosis not present

## 2023-02-05 DIAGNOSIS — Z9181 History of falling: Secondary | ICD-10-CM | POA: Diagnosis not present

## 2023-02-07 DIAGNOSIS — R296 Repeated falls: Secondary | ICD-10-CM | POA: Diagnosis not present

## 2023-02-07 DIAGNOSIS — R260 Ataxic gait: Secondary | ICD-10-CM | POA: Diagnosis not present

## 2023-02-07 DIAGNOSIS — Z9181 History of falling: Secondary | ICD-10-CM | POA: Diagnosis not present

## 2023-02-07 DIAGNOSIS — R262 Difficulty in walking, not elsewhere classified: Secondary | ICD-10-CM | POA: Diagnosis not present

## 2023-02-21 ENCOUNTER — Other Ambulatory Visit: Payer: Medicare Other

## 2023-02-21 ENCOUNTER — Encounter: Payer: Self-pay | Admitting: Nurse Practitioner

## 2023-02-21 ENCOUNTER — Ambulatory Visit: Payer: Medicare Other | Admitting: Nurse Practitioner

## 2023-02-21 VITALS — BP 120/80 | HR 88 | Ht 64.0 in

## 2023-02-21 DIAGNOSIS — K279 Peptic ulcer, site unspecified, unspecified as acute or chronic, without hemorrhage or perforation: Secondary | ICD-10-CM | POA: Diagnosis not present

## 2023-02-21 DIAGNOSIS — Z1211 Encounter for screening for malignant neoplasm of colon: Secondary | ICD-10-CM

## 2023-02-21 LAB — CBC
HCT: 39.9 % (ref 36.0–46.0)
Hemoglobin: 13.1 g/dL (ref 12.0–15.0)
MCHC: 32.9 g/dL (ref 30.0–36.0)
MCV: 98.1 fL (ref 78.0–100.0)
Platelets: 376 10*3/uL (ref 150.0–400.0)
RBC: 4.07 Mil/uL (ref 3.87–5.11)
RDW: 13.9 % (ref 11.5–15.5)
WBC: 7.6 10*3/uL (ref 4.0–10.5)

## 2023-02-21 LAB — COMPREHENSIVE METABOLIC PANEL
ALT: 13 U/L (ref 0–35)
AST: 12 U/L (ref 0–37)
Albumin: 4.4 g/dL (ref 3.5–5.2)
Alkaline Phosphatase: 84 U/L (ref 39–117)
BUN: 12 mg/dL (ref 6–23)
CO2: 27 meq/L (ref 19–32)
Calcium: 9.4 mg/dL (ref 8.4–10.5)
Chloride: 106 meq/L (ref 96–112)
Creatinine, Ser: 0.59 mg/dL (ref 0.40–1.20)
GFR: 91.73 mL/min (ref >60.00)
Glucose, Bld: 108 mg/dL — ABNORMAL HIGH (ref 70–99)
Potassium: 3.5 meq/L (ref 3.5–5.1)
Sodium: 141 meq/L (ref 135–145)
Total Bilirubin: 0.5 mg/dL (ref 0.2–1.2)
Total Protein: 6.7 g/dL (ref 6.0–8.3)

## 2023-02-21 NOTE — Progress Notes (Signed)
02/21/2023 Natasha Frederick [id number] [birth year]   CHIEF COMPLAINT: Discuss scheduling a colonoscopy  HISTORY OF PRESENT ILLNESS: Natasha Frederick is a 67 year old female with a past medical history significant for Huntington's disease initially diagnosed 12/2018, C. 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   She presents to our office today as referred by Dr. Evelena Peat to discuss scheduling a screening colonoscopy.  She is accompanied by her husband.  She describes passing a normal formed brown bowel movement 3 days weekly.  No diarrhea.  No rectal bleeding or black stools.  She is content with this bowel pattern.  She sometimes has abdominal gas.  She denies having any upper abdominal pain.  She infrequently has central lower abdominal pain which is not severe.  She has significant Huntington's disease and s/p right BKA therefore she is unable to walk.  She sits in a wheelchair throughout most of the day. She reported undergoing a colonoscopy approximately 20 years ago which she believes was normal.  No known family history of colon polyps or colorectal cancer.  She denies having any nausea or vomiting.  No heartburn or dysphagia.  No changes to appetite.  She has a history of a perforated gastric ulcer which required surgical revision 07/2018 as noted above. She subsequently underwent an EGD by Dr. Myrtie Neither as an outpatient at [facility] 11/25/2018 which showed a widely patent Schatzki's ring, nonbleeding gastric ulcer without stigmata of bleeding.  Path report showed chronic inactive gastritis without evidence of Helicobacter pylori or malignancy.  No longer taking a PPI.     Latest Ref Rng & Units 09/27/2021    3:39 PM 06/20/2021    2:19 PM 08/13/2018    6:47 AM  CBC  WBC 4.0 - 10.5 K/uL 8.1  8.9   16.2   Hemoglobin 12.0 - 15.0 g/dL 96.2  95.2  8.8   Hematocrit 36.0 - 46.0 % 37.5  37.3  27.4   Platelets 150.0 - 400.0 K/uL 421.0  416.0  1,200        Latest Ref Rng & Units 09/27/2021    3:39 PM 07/14/2021   12:08 PM 08/13/2018    6:47 AM  CMP  Glucose 70 - 99 mg/dL 841   93   BUN 6 - 23 mg/dL 13   5   Creatinine 3.24 - 1.20 mg/dL 4.01  0.27  2.53   Sodium 135 - 145 mEq/L 141   136   Potassium 3.5 - 5.1 mEq/L 3.3   3.3   Chloride 96 - 112 mEq/L 108   103   CO2 19 - 32 mEq/L 25   23   Calcium 8.4 - 10.5 mg/dL 9.5   8.1   Total Protein 6.0 - 8.3 g/dL 6.7     Total Bilirubin 0.2 - 1.2 mg/dL 0.4     Alkaline Phos 39 - 117 U/L 71     AST 0 - 37 U/L 11     ALT 0 - 35 U/L 8       EGD 11/25/2018: - Widely patent Schatzki ring.  - Non-bleeding gastric ulcer with no stigmata of bleeding. Biopsied.  - Normal examined duodenum. 1. Stomach, biopsy - REACTIVE-TYPE GASTROPATHY WITH HYPERPLASTIC EPITHELIAL CHANGES AND FOCAL ULCERATION. - THERE IS NO EVIDENCE OF DYSPLASIA OR MALIGNANCY. 2. Stomach, biopsy,  random - CHRONIC INACTIVE GASTRITIS. - THERE IS NO EVIDENCE OF HELICOBACTER PYLORI, DYSPLASIA OR MALIGNANCY. - SEE COMMENT.  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 difficile diarrhea    Endometriosis    Huntington disease (HCC)    Peptic ulcer    Perforated gastric ulcer (HCC)    Scoliosis    Past Surgical History:  Procedure Laterality Date   AMPUTATION Right 08/08/2018   Procedure: AMPUTATION BELOW KNEE RIGHT LOWER EXTREMITY;  Surgeon: Larina Earthly, MD;  Location: [facility] OR;  Service: Vascular;  Laterality: Right;   ANTERIOR LAT LUMBAR FUSION Left 01/05/2014   Procedure: LUMBAR TWO TO THREE, LUMBAR LUMBAR THREE TO FOUR, ANTERIOR LATERAL LUMBAR FUSION 2 LEVELS;  Surgeon: Reinaldo Meeker, MD;  Location: [facility] NEURO ORS;  Service: Neurosurgery;  Laterality: Left;   APPENDECTOMY   1963   BIOPSY  11/25/2018   Procedure: BIOPSY;  Surgeon: Sherrilyn Rist, MD;  Location: [facility] ENDOSCOPY;  Service: Gastroenterology;;   BREAST SURGERY Left 1987   bx   COLONOSCOPY W/ POLYPECTOMY     ESOPHAGOGASTRODUODENOSCOPY (EGD) WITH PROPOFOL N/A 11/25/2018   Procedure: ESOPHAGOGASTRODUODENOSCOPY (EGD) WITH PROPOFOL;  Surgeon: Sherrilyn Rist, MD;  Location: [facility] ENDOSCOPY;  Service: Gastroenterology;  Laterality: N/A;   EYE SURGERY Bilateral    FEMORAL-POPLITEAL BYPASS GRAFT Bilateral 08/06/2018   Procedure: BILATERAL POPLITEAL AND TIBIAL EMBOLECTOMIES, LEFT LEG FASCIOTOMY;  Surgeon: Larina Earthly, MD;  Location: [facility] OR;  Service: Vascular;  Laterality: Bilateral;   LAPAROSCOPY N/A 07/30/2018   Procedure: DIAGNOSTIC LAPAROSCOPY, Omentopexy gram patch, Washout of intra-abdominal abcesses x 3;  Surgeon: Karie Soda, MD;  Location: [facility] ORS;  Service: General;  Laterality: N/A;   OOPHORECTOMY Left 1977   Planters wart Left 2005   radial keratoto Bilateral    TONSILLECTOMY  1958   TOTAL HIP ARTHROPLASTY Left 07/19/2014   Procedure: TOTAL HIP ARTHROPLASTY;  Surgeon: Gean Birchwood, MD;  Location: [facility] OR;  Service: Orthopedics;  Laterality: Left;   TUBAL LIGATION  1986   Social History: She is married.  She quit smoking cigarettes 37 years ago.  No alcohol use.  No drug use.  Family History: No known family history of colon polyps or colorectal cancer.  Paternal aunt, paternal grandmother and paternal uncle with history of Huntington's disease.  Mother with lung cancer.  Father had a stroke.  Allergies  Allergen Reactions   Bee Venom Anaphylaxis and Swelling   Nsaids Other (See Comments)    PERFORATED ULCER = NO MORE NSAIDs   Adhesive [Tape] Other (See Comments)    redness   Codeine Nausea And Vomiting   Penicillins     Did it involve swelling of the face/tongue/throat, SOB, or low BP? Yes Did it involve sudden or severe rash/hives, skin peeling, or any reaction on the inside of your mouth  or nose? Unknown Did you need to seek medical attention at a hospital or doctor's office? Yes When did it last happen?   age 8-40    If all above answers are "NO", may proceed with cephalosporin use.    Sulfa Antibiotics Itching      Outpatient Encounter Medications as of 02/21/2023  Medication Sig   acetaminophen (TYLENOL) 500 MG tablet Take 1,000 mg by mouth every morning.   amantadine (SYMMETREL) 100 MG capsule Take 100 mg by mouth 2 (two) times  daily.   risperiDONE (RISPERDAL) 0.5 MG tablet Take 0.5 mg by mouth 2 (two) times daily.   Vibegron (GEMTESA) 75 MG TABS Take 1 tablet by mouth daily   ciclopirox (LOPROX) 0.77 % cream Apply topically 2 (two) times daily. (Patient not taking: Reported on 02/21/2023)   ketotifen (ZADITOR) 0.025 % ophthalmic solution Place 1 drop into both eyes 2 (two) times daily. (Patient not taking: Reported on 02/21/2023)   sennosides-docusate sodium (SENOKOT-S) 8.6-50 MG tablet Take 1 tablet by mouth daily. (Patient not taking: Reported on 02/21/2023)   sertraline (ZOLOFT) 100 MG tablet TAKE 1 TABLET(100 MG) BY MOUTH DAILY (Patient not taking: Reported on 02/21/2023)   No facility-administered encounter medications on file as of 02/21/2023.   REVIEW OF SYSTEMS:  Gen: Denies fever, sweats or chills. No weight loss.  CV: Denies chest pain, palpitations or edema. Resp: Denies cough, shortness of breath of hemoptysis.  GI: See HPI. GU:+ Urine incontinence. MS: + Muscle weakness. Derm: Denies rash, itchiness, skin lesions or unhealing ulcers. Psych: Denies depression, anxiety, memory loss or confusion. Heme: Denies bruising, easy bleeding. Neuro: Hunington's disease, unable to walk. Endo:  Denies any problems with DM, thyroid or adrenal function.  PHYSICAL EXAM:BP 120/80 (BP Location: Left Arm, Patient Position: Sitting, Cuff Size: Normal)   Pulse 88   Ht 5\' 4"  (1.626 m)   SpO2 95%   BMI 25.58 kg/m  Patient unable to stand for weight. Husband  reported last known weight was 145lbs.  . General: 68 year female chronically ill appearing presents in a wheelchair in no acute distress. Head: Normocephalic and atraumatic. Eyes: Sclerae non-icteric, conjunctive pink. Ears: Normal auditory acuity. Mouth: Dentition intact. No ulcers or lesions.  Neck: Supple, no lymphadenopathy or thyromegaly.  Lungs: Clear bilaterally to auscultation without wheezes, crackles or rhonchi. Heart: Regular rate and rhythm. No murmur, rub or gallop appreciated.  Abdomen: Soft, protuberant, nontender, nondistended. No masses. No hepatosplenomegaly. Normoactive bowel sounds x 4 quadrants.  Rectal: Deferred. Musculoskeletal: Right BKA with prosthesis. LLE with brace.  Skin: Warm and dry. No rash or lesions on visible extremities. Extremities: No edema. Neurological: Alert oriented x 4. Generalized weakness.   Psychological:  Alert and cooperative. Normal mood and affect.  ASSESSMENT AND PLAN:  67 year old female presents to schedule a screening colonoscopy.  Patient endorsed undergoing a colonoscopy approximately 20 years ago, results unknown.  No known family history of colon polyps or colorectal cancer. -Due to the patient's multiple comorbidities, I assessed the benefits of a conventional colonoscopy do not outweigh the risks.  I will consult with Dr. Myrtie Neither to obtain his recommendations regarding any future colon cancer screening. -Last CBC was done 1 year ago, will check a CBC at this time to rule out anemia  History of GERD, perforated gastric ulcer 2020  Huntington's disease, followed by neurology. On Resperdal.       CC:  Burchette, Elberta Fortis, MD

## 2023-02-21 NOTE — Patient Instructions (Addendum)
Your provider has requested that you go to the basement level for lab work before leaving today. Press "B" on the elevator. The lab is located at the first door on the left as you exit the elevator.   Due to recent changes in healthcare laws, you may see the results of your imaging and laboratory studies on MyChart before your provider has had a chance to review them.  We understand that in some cases there may be results that are confusing or concerning to you. Not all laboratory results come back in the same time frame and the provider may be waiting for multiple results in order to interpret others.  Please give us 48 hours in order for your provider to thoroughly review all the results before contacting the office for clarification of your results.    Thank you for trusting me with your gastrointestinal care!   Colleen Kennedy-Smith, CRNP   

## 2023-02-25 NOTE — Progress Notes (Signed)
____________________________________________________________  Attending physician addendum:  Thank you for sending this case to me. I have reviewed the entire note and agree with the plan.  Based on your assessment, I agree that a screening colonoscopy would present increased risks and some logistical challenges regarding the bowel preparation.  However, it would seem she could undergo colonoscopy for strong indication if necessary, albeit in the hospital outpatient endoscopy lab.  She and her husband would need to agree that she could get through a bowel prep with his assistance. In all, I think a Cologuard is the best option for her.  If positive, she would need a colonoscopy in the outpatient hospital-based endoscopy lab.  Amada Jupiter, MD  ____________________________________________________________

## 2023-02-26 ENCOUNTER — Telehealth: Payer: Self-pay | Admitting: Nurse Practitioner

## 2023-02-26 NOTE — Progress Notes (Signed)
I attempted to contact the patient at this time, she did not answer and her voicemail was not set up for messages at this time.  I will contact the patient/spouse later this afternoon.

## 2023-02-26 NOTE — Telephone Encounter (Signed)
Inbound call from patient's husband returning call from [place].

## 2023-02-27 NOTE — Telephone Encounter (Signed)
I attempted to contact patient's husband at this time, he did not answer his phone and voice mail was not set up at this time. I will attempt to contact patient tomorrow to discuss recommendations for a Cologuard test.

## 2023-02-27 NOTE — Telephone Encounter (Signed)
Please advise 

## 2023-02-27 NOTE — Progress Notes (Signed)
Natasha Frederick, I spoke with patient's husband regarding Dr. Myrtie Neither' recommendations regarding colon cancer screening. Her husband stated he and his wife talked about scheduling a colonoscopy verses a Cologuard in further detail after her office appointment and she wishes to complete the Cologuard test as recommended by Dr. Myrtie Neither. To further discuss scheduling a colonoscopy in the hospital setting if the Cologuard test is positive. Pls enter a Cologuard order. Thank yo.

## 2023-02-28 ENCOUNTER — Telehealth: Payer: Self-pay

## 2023-02-28 ENCOUNTER — Other Ambulatory Visit: Payer: Self-pay

## 2023-02-28 DIAGNOSIS — Z1211 Encounter for screening for malignant neoplasm of colon: Secondary | ICD-10-CM

## 2023-02-28 NOTE — Telephone Encounter (Signed)
Author: Arnaldo Natal, NP Service: Gastroenterology Author Type: Nurse Practitioner  Filed: 02/27/2023  1:38 PM Encounter Date: 02/21/2023 Status: Signed  Editor: Arnaldo Natal, NP (Nurse Practitioner)   Natasha Frederick, I spoke with patient's husband regarding Dr. Myrtie Neither' recommendations regarding colon cancer screening. Her husband stated he and his wife talked about scheduling a colonoscopy verses a Cologuard in further detail after her office appointment and she wishes to complete the Cologuard test as recommended by Dr. Myrtie Neither. To further discuss scheduling a colonoscopy in the hospital setting if the Cologuard test is positive. Pls enter a Cologuard order. Thank yo.

## 2023-02-28 NOTE — Telephone Encounter (Signed)
DD, I attempted to contact the patient's husband yesterday and at this time and he did not answer and his voice mail was not set up. If he returns my call, please let him know that a Cologuard test was recommended for his wife. I will gladly speak with him if he has further questions, if so, please ask patient when is the best time to reach him. THX.

## 2023-02-28 NOTE — Telephone Encounter (Signed)
Unable to reach patient, VM box not set up. Cologuard ordered.

## 2023-03-01 NOTE — Telephone Encounter (Signed)
Unable to reach patient, VM box not set up. 

## 2023-03-05 ENCOUNTER — Other Ambulatory Visit: Payer: Self-pay | Admitting: Family Medicine

## 2023-03-05 NOTE — Telephone Encounter (Signed)
Spoke with patient's husband & he says cologuard has already reached out to them, and will be sending kit.

## 2023-03-05 NOTE — Telephone Encounter (Signed)
Third attempt to reach patient, VM box not set up. Letter mailed home.

## 2023-03-11 ENCOUNTER — Other Ambulatory Visit: Payer: Self-pay | Admitting: Family Medicine

## 2023-03-21 DIAGNOSIS — Z1211 Encounter for screening for malignant neoplasm of colon: Secondary | ICD-10-CM | POA: Diagnosis not present

## 2023-03-27 ENCOUNTER — Other Ambulatory Visit: Payer: Self-pay

## 2023-03-27 ENCOUNTER — Emergency Department (HOSPITAL_COMMUNITY)
Admission: EM | Admit: 2023-03-27 | Discharge: 2023-03-28 | Disposition: A | Discharge status: Skilled Nursing Facility | Payer: Medicare Other | Attending: Emergency Medicine | Admitting: Emergency Medicine

## 2023-03-27 ENCOUNTER — Encounter (HOSPITAL_COMMUNITY): Payer: Self-pay | Admitting: Emergency Medicine

## 2023-03-27 DIAGNOSIS — L03312 Cellulitis of back [any part except buttock]: Secondary | ICD-10-CM | POA: Diagnosis not present

## 2023-03-27 DIAGNOSIS — G10 Huntington's disease: Secondary | ICD-10-CM | POA: Insufficient documentation

## 2023-03-27 DIAGNOSIS — D72829 Elevated white blood cell count, unspecified: Secondary | ICD-10-CM | POA: Insufficient documentation

## 2023-03-27 DIAGNOSIS — R2689 Other abnormalities of gait and mobility: Secondary | ICD-10-CM | POA: Diagnosis not present

## 2023-03-27 DIAGNOSIS — Z7409 Other reduced mobility: Secondary | ICD-10-CM | POA: Diagnosis not present

## 2023-03-27 DIAGNOSIS — Z89511 Acquired absence of right leg below knee: Secondary | ICD-10-CM | POA: Diagnosis not present

## 2023-03-27 DIAGNOSIS — Z79899 Other long term (current) drug therapy: Secondary | ICD-10-CM | POA: Insufficient documentation

## 2023-03-27 DIAGNOSIS — N3090 Cystitis, unspecified without hematuria: Secondary | ICD-10-CM | POA: Insufficient documentation

## 2023-03-27 LAB — BASIC METABOLIC PANEL
Anion gap: 12 (ref 5–15)
BUN: 11 mg/dL (ref 8–23)
CO2: 22 mmol/L (ref 22–32)
Calcium: 9.5 mg/dL (ref 8.9–10.3)
Chloride: 109 mmol/L (ref 98–111)
Creatinine, Ser: 0.57 mg/dL (ref 0.44–1.00)
GFR, Estimated: >60 mL/min (ref >60)
Glucose, Bld: 108 mg/dL — ABNORMAL HIGH (ref 70–99)
Potassium: 3.6 mmol/L (ref 3.5–5.1)
Sodium: 143 mmol/L (ref 135–145)

## 2023-03-27 LAB — COLOGUARD: COLOGUARD: POSITIVE — AB

## 2023-03-27 LAB — CBC WITH DIFFERENTIAL/PLATELET
Abs Immature Granulocytes: 0.04 10*3/uL (ref 0.00–0.07)
Basophils Absolute: 0.1 10*3/uL (ref 0.0–0.1)
Basophils Relative: 1 %
Eosinophils Absolute: 0.1 10*3/uL (ref 0.0–0.5)
Eosinophils Relative: 1 %
HCT: 37.9 % (ref 36.0–46.0)
Hemoglobin: 12.1 g/dL (ref 12.0–15.0)
Immature Granulocytes: 0 %
Lymphocytes Relative: 14 %
Lymphs Abs: 1.6 10*3/uL (ref 0.7–4.0)
MCH: 31.8 pg (ref 26.0–34.0)
MCHC: 31.9 g/dL (ref 30.0–36.0)
MCV: 99.7 fL (ref 80.0–100.0)
Monocytes Absolute: 1.3 10*3/uL — ABNORMAL HIGH (ref 0.1–1.0)
Monocytes Relative: 11 %
Neutro Abs: 8.7 10*3/uL — ABNORMAL HIGH (ref 1.7–7.7)
Neutrophils Relative %: 73 %
Platelets: 421 10*3/uL — ABNORMAL HIGH (ref 150–400)
RBC: 3.8 MIL/uL — ABNORMAL LOW (ref 3.87–5.11)
RDW: 13.2 % (ref 11.5–15.5)
WBC: 11.9 10*3/uL — ABNORMAL HIGH (ref 4.0–10.5)
nRBC: 0 % (ref 0.0–0.2)

## 2023-03-27 LAB — URINALYSIS, ROUTINE W REFLEX MICROSCOPIC
Bilirubin Urine: NEGATIVE
Glucose, UA: NEGATIVE mg/dL
Hgb urine dipstick: NEGATIVE
Ketones, ur: NEGATIVE mg/dL
Nitrite: NEGATIVE
Protein, ur: NEGATIVE mg/dL
Specific Gravity, Urine: 1.024 (ref 1.005–1.030)
pH: 5 (ref 5.0–8.0)

## 2023-03-27 MED ORDER — CYCLOSPORINE 0.05 % OP EMUL
1.0000 [drp] | Freq: Two times a day (BID) | OPHTHALMIC | Status: DC
  Administered 2023-03-27 – 2023-03-28 (×2): 1 [drp] via OPHTHALMIC
  Filled 2023-03-27 (×4): qty 30

## 2023-03-27 MED ORDER — FOSFOMYCIN TROMETHAMINE 3 G PO PACK
3.0000 g | PACK | Freq: Once | ORAL | Status: AC
  Administered 2023-03-27: 3 g via ORAL
  Filled 2023-03-27: qty 3

## 2023-03-27 MED ORDER — DOXYCYCLINE HYCLATE 100 MG PO TABS
100.0000 mg | ORAL_TABLET | Freq: Once | ORAL | Status: AC
  Administered 2023-03-27: 100 mg via ORAL
  Filled 2023-03-27: qty 1

## 2023-03-27 MED ORDER — MIRABEGRON ER 25 MG PO TB24
25.0000 mg | ORAL_TABLET | Freq: Every day | ORAL | Status: DC
  Administered 2023-03-27 – 2023-03-28 (×2): 25 mg via ORAL
  Filled 2023-03-27 (×2): qty 1

## 2023-03-27 MED ORDER — SERTRALINE HCL 100 MG PO TABS
100.0000 mg | ORAL_TABLET | Freq: Every day | ORAL | Status: DC
  Filled 2023-03-27: qty 1

## 2023-03-27 MED ORDER — DOXYCYCLINE HYCLATE 100 MG PO TABS
100.0000 mg | ORAL_TABLET | Freq: Two times a day (BID) | ORAL | Status: DC
  Administered 2023-03-28: 100 mg via ORAL
  Filled 2023-03-27: qty 1

## 2023-03-27 MED ORDER — ACETAMINOPHEN 500 MG PO TABS: 1000.0000 mg | ORAL_TABLET | Freq: Every morning | ORAL | Status: DC

## 2023-03-27 MED ORDER — RISPERIDONE 2 MG PO TABS
4.0000 mg | ORAL_TABLET | Freq: Two times a day (BID) | ORAL | Status: DC
  Administered 2023-03-27 – 2023-03-28 (×2): 4 mg via ORAL
  Filled 2023-03-27 (×4): qty 2

## 2023-03-27 MED ORDER — NYSTATIN 100000 UNIT/GM EX POWD
Freq: Once | CUTANEOUS | Status: AC
  Filled 2023-03-27: qty 15

## 2023-03-27 MED ORDER — AMANTADINE HCL 100 MG PO CAPS
100.0000 mg | ORAL_CAPSULE | Freq: Two times a day (BID) | ORAL | Status: DC
  Administered 2023-03-27 – 2023-03-28 (×2): 100 mg via ORAL
  Filled 2023-03-27 (×4): qty 1

## 2023-03-27 NOTE — ED Triage Notes (Signed)
Pt is wife of pt in Rm 2 who is being admitted.  This pt has Huntington's and is almost total care which husband provides.  Pt needs our assistance being cared for at least while husband is in hospital.    When undressing pt I noted a significant wound to her right upper back where her bra strap sits. It is red, a little deep and has an odor.  She has some redness in her groin and perineum as well.  She was able to remain sitting upright when I sat her upright but wasn't really able to help me much with her arms or legs.   Pt answered mostly yes and no to questions asked but did tell me she was allergic to penicillin.

## 2023-03-27 NOTE — [place] FL2 (Signed)
[place] MEDICAID FL2 LEVEL OF CARE FORM     IDENTIFICATION  Patient Name: Natasha Frederick Birthdate: 15-Mar-1956 Sex: female Admission Date (Current Location): 03/27/2023  [facility] [place] and [place] Number:  Producer, television/film/video and Address:  [facility], [address], [place], [place] [address]      Provider Number: [id number]  Attending Physician Name and Address:  Terald Sleeper, MD  Relative Name and Phone Number:       Current Level of Care: Hospital Recommended Level of Care: Skilled Nursing Facility Prior Approval Number:    Date Approved/Denied:   PASRR Number: [id number]  Discharge Plan: SNF    Current Diagnoses: Patient Active Problem List   Diagnosis Date Noted   GERD (gastroesophageal reflux disease) 11/27/2021   Osteoporosis 11/23/2021   Depression, recurrent (HCC) 05/12/2019   Below-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) 01/26/2019   Anxiety and depression 12/21/2018   Chronic back pain 11/05/2018   Cold right foot 08/06/2018   Delirium 08/01/2018   Perforated gastric ulcer s/p omental patch 07/30/2018 07/31/2018   ARF (acute renal failure) (HCC) 07/30/2018   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   Primary osteoarthritis of left hip 07/19/2014   Scoliosis of lumbar spine 01/05/2014    Orientation RESPIRATION BLADDER Height & Weight      (Unable to assess)  Normal Continent Weight:   Height:     BEHAVIORAL SYMPTOMS/MOOD NEUROLOGICAL BOWEL NUTRITION STATUS      Continent Diet  AMBULATORY STATUS COMMUNICATION OF NEEDS Skin   Total Care Verbally PU Stage and Appropriate Care (Patient has a stage 2 wound on her shoulder, red blanchness on groin, history of lower extremity wounds also)   PU Stage 2 Dressing: No Dressing                   Personal Care Assistance Level of Assistance  Feeding, Bathing, Dressing, Total care Bathing Assistance: Maximum assistance Feeding assistance: Maximum assistance Dressing  Assistance: Maximum assistance Total Care Assistance: Maximum assistance   Functional Limitations Info  Sight, Hearing, Speech Sight Info: Adequate Hearing Info: Adequate Speech Info: Impaired    SPECIAL CARE FACTORS FREQUENCY  PT (By licensed PT), OT (By licensed OT)     PT Frequency: 5x weekly OT Frequency: 5x weekly            Contractures Contractures Info: Not present    Additional Factors Info  Code Status, Allergies Code Status Info: Full Code Allergies Info: Bee Venom  Nsaids  Adhesive (Tape)  Codeine  Penicillins  Sulfa Antibiotics           Current Medications (03/27/2023):  This is the current hospital active medication list No current facility-administered medications for this encounter.   Current Outpatient Medications  Medication Sig Dispense Refill   acetaminophen (TYLENOL) 500 MG tablet Take 1,000 mg by mouth every morning.     amantadine (SYMMETREL) 100 MG capsule Take 100 mg by mouth 2 (two) times daily.     ciclopirox (LOPROX) 0.77 % cream Apply topically 2 (two) times daily. (Patient not taking: Reported on 02/21/2023) 15 g 1   ketotifen (ZADITOR) 0.025 % ophthalmic solution Place 1 drop into both eyes 2 (two) times daily. (Patient not taking: Reported on 02/21/2023) 5 mL 0   risperiDONE (RISPERDAL) 0.5 MG tablet Take 0.5 mg by mouth 2 (two) times daily.     sennosides-docusate sodium (SENOKOT-S) 8.6-50 MG tablet Take 1 tablet by mouth daily. (Patient not taking: Reported on  02/21/2023)     sertraline (ZOLOFT) 100 MG tablet TAKE 1 TABLET(100 MG) BY MOUTH DAILY (Patient not taking: Reported on 02/21/2023) 90 tablet 0   Vibegron (GEMTESA) 75 MG TABS Take 1 tablet by mouth daily 90 tablet 1     Discharge Medications: Please see discharge summary for a list of discharge medications.  Relevant Imaging Results:  Relevant Lab Results:   Additional Information SS # [id number], patient has Huntington's disease  Inis Sizer, LCSW

## 2023-03-27 NOTE — Progress Notes (Signed)
CSW aware of patient's presence in the ED. Patient is from home with her husband Melvyn Neth who is being medically admitted. Patient has Huntington's disease and is essentially total care and Lewis is the main caregiver.   CSW spoke with Rulon Sera of [facility] APS regarding patient and her husband. APS case to remain open until both patient and her husband are discharged.  Patient has traditional Medicare and is eligible for the [facility] ACO REACH waiver.  CSW spoke with RN who states patient has a wound on her back and skin redness on her groin.  CSW spoke with Lawerance Cruel in admissions at [facility] who is agreeable to review patient for possible placement.  Edwin Dada, MSW, LCSW Transitions of Care  Clinical Social Worker II [phone]

## 2023-03-27 NOTE — ED Provider Notes (Signed)
[place] [facility]MENT AT [facility] Provider Note   CSN: [id number] Arrival date & time: 03/27/23  1105     History  Chief Complaint  Patient presents with   Self-Care deficit    Natasha Frederick is a 67 y.o. female with history of Huntington's disease, presents with concern for difficulty taking care of herself.  States her husband is being admitted to the hospital and is her primary caretaker. She has a son listed in the chart, but he is living in [place] and is unable to care for patient.  She does not have anyone else to take care of her while he is in the hospital.   Patient was noted to have a wound to her left upper back. Patient reports this area is painful. Denies any other complaints  HPI     Home Medications Prior to Admission medications   Medication Sig Start Date End Date Taking? Authorizing Provider  acetaminophen (TYLENOL) 500 MG tablet Take 2,000 mg by mouth in the morning.   Yes [provider]  amantadine (SYMMETREL) 100 MG capsule Take 100 mg by mouth 2 (two) times daily. 11/02/22  Yes [provider]  MYRBETRIQ 25 MG TB24 tablet Take 25 mg by mouth daily. 12/11/22  Yes [provider]  RESTASIS 0.05 % ophthalmic emulsion Place 1 drop into both eyes 2 (two) times daily. 02/12/23  Yes [provider]  risperidone (RISPERDAL) 4 MG tablet Take 4 mg by mouth 2 (two) times daily.   Yes [provider]  sertraline (ZOLOFT) 100 MG tablet TAKE 1 TABLET(100 MG) BY MOUTH DAILY Patient taking differently: Take 100 mg by mouth at bedtime. TAKE 1 TABLET(100 MG) BY MOUTH DAILY 02/05/23  Yes Burchette, Elberta Fortis, MD  Vibegron (GEMTESA) 75 MG TABS Take 1 tablet by mouth daily Patient taking differently: Take 75 mg by mouth at bedtime. Take 1 tablet by mouth daily 10/24/22  Yes [place], Betty G, MD  fluorometholone (FML) 0.1 % ophthalmic suspension Place 1 drop into both eyes 2 (two) times daily. Patient not taking:  Reported on 03/27/2023 01/09/23   [provider]      Allergies    Bee venom, Nsaids, Adhesive [tape], Codeine, Penicillins, and Sulfa antibiotics    Review of Systems   Review of Systems  Constitutional:  Negative for fever.    Physical Exam Updated Vital Signs BP 126/75   Pulse 71   Temp (!) 97.5 F (36.4 C) (Temporal)   Resp 16   SpO2 97%  Physical Exam Vitals and nursing note reviewed.  Constitutional:      Appearance: Normal appearance.     Comments: Alert and able to give yes and no responses  HENT:     Head: Atraumatic.  Cardiovascular:     Rate and Rhythm: Normal rate and regular rhythm.  Pulmonary:     Effort: Pulmonary effort is normal.  Skin:    Comments: Large 4cm linear wound in skin crease of upper back. Surrounding erythema. Purulent material noted in the abrasion  Neurological:     General: No focal deficit present.     Mental Status: She is alert.  Psychiatric:        Mood and Affect: Mood normal.        Behavior: Behavior normal.       ED Results / Procedures / Treatments   Labs (all labs ordered are listed, but only abnormal results are displayed) Labs Reviewed  CBC WITH DIFFERENTIAL/PLATELET -  Abnormal; Notable for the following components:      Result Value   WBC 11.9 (*)    RBC 3.80 (*)    Platelets 421 (*)    Neutro Abs 8.7 (*)    Monocytes Absolute 1.3 (*)    All other components within normal limits  BASIC METABOLIC PANEL - Abnormal; Notable for the following components:   Glucose, Bld 108 (*)    All other components within normal limits  URINALYSIS, ROUTINE W REFLEX MICROSCOPIC - Abnormal; Notable for the following components:   APPearance HAZY (*)    Leukocytes,Ua LARGE (*)    Bacteria, UA RARE (*)    All other components within normal limits  URINE CULTURE    EKG None  Radiology No results found.  Procedures Procedures    Medications Ordered in ED Medications  doxycycline (VIBRA-TABS) tablet 100 mg (has  no administration in time range)  nystatin (MYCOSTATIN/NYSTOP) topical powder (has no administration in time range)  fosfomycin (MONUROL) packet 3 g (has no administration in time range)  acetaminophen (TYLENOL) tablet 1,000 mg (has no administration in time range)  amantadine (SYMMETREL) capsule 100 mg (has no administration in time range)  mirabegron ER (MYRBETRIQ) tablet 25 mg (has no administration in time range)  cycloSPORINE (RESTASIS) 0.05 % ophthalmic emulsion 1 drop (has no administration in time range)  risperiDONE (RISPERDAL) tablet 4 mg (has no administration in time range)  sertraline (ZOLOFT) tablet 100 mg (has no administration in time range)    ED Course/ Medical Decision Making/ A&P Clinical Course as of 03/27/23 1543  Wed Mar 27, 2023  1512 67 yo female w/ chronic home health needs, whose husband is admitted to hospital, here because she cannot care for herself at home.  She has huntington's.  Cellulitic rash on backside with questionable underlying fungal infection?,  Doxycycline ordered as well as nystatin.  Urinalysis with large leukocytes, negative nitrites.  Single dose fosfomycin ordered urine culture will be sent off.  Unclear if this is a contaminant or true infection.  We are awaiting a BNP, if there are no emergent findings the patient may require PT evaluation for placement in SNF/Rehab [MT]    Clinical Course User Index [MT] Trifan, Kermit Balo, MD                                 Medical Decision Making Amount and/or Complexity of Data Reviewed Labs: ordered.  Risk OTC drugs. Prescription drug management.     Differential diagnosis includes but is not limited to self-care needs, cellulitis, fungal infection  ED Course:  Patient with Huntington's disease and unable to care for herself.  Her husband is being admitted to the hospital and is her primary caregiver.  Patient does not have any other family who is able to take care of her.  Social worker has  been consulted and is working on SNF placement for her while husband is in the hospital. Patient with leukocytes in urinalysis, but denies any urinary symptoms.  BMP unremarkable.  CBC with slight leukocytosis at 11.9. Patient does have a abrasion to the left side of her back which appears like a possible cellulitis.  I have ordered doxycycline and nystatin for this area. Patient's home medications have been ordered and PT consulted.  Awaiting social work to determine if patient has SNF placement or needs to boarder in the ER   Impression: Cellulitis of back Patient with home  health needs  Disposition:  Care of this patient signed out to oncoming ED provider Rhea Bleacher, PA-C to follow up on social work recommendations. Disposition and treatment plan pending imaging results and clinical judgment of oncoming ED team.                 Final Clinical Impression(s) / ED Diagnoses Final diagnoses:  Cellulitis of back    Rx / DC Orders ED Discharge Orders     None         Arabella Merles, PA-C 03/27/23 1544    Terald Sleeper, MD 03/27/23 [phone]

## 2023-03-27 NOTE — Evaluation (Signed)
Physical Therapy Evaluation Patient Details Name: Natasha Frederick MRN: [id number] DOB: 26-Nov-1955 Today's Date: 03/27/2023  History of Present Illness  67 y.o. female presents to [facility] hospital on 03/27/2023 due to lack of caregiver support. Pt has huntington's disease and her spouse is her caretaker at baseline, however he is currently hospitalized. PMH includes Huntington's disease, anxiety, depression, R BKA, osteoporosis, GERD.  Clinical Impression  Pt presents to PT with deficits in coordination, strength, power, balance, functional mobility, communication. Pt has significant coordination deficits, in both fine and gross motor, affecting all aspects of her mobility and communication. Pt demonstrates more significant R sided weakness than left in UE at this time, however this could also be due to impaired motor control. Pt is unable to stand with PT assistance due to extension of trunk and LLE with attempts. Pt is at a high risk for falls/injury, and currently lacks sufficient caregiver support as her spouse is hospitalized. PT will follow in the acute setting in an effort to improve transfer quality and to maintain strength/ROM. Patient will benefit from continued inpatient follow up therapy, <3 hours/day.        If plan is discharge home, recommend the following: Two people to help with walking and/or transfers;A lot of help with bathing/dressing/bathroom;Assistance with cooking/housework;Assistance with feeding;Direct supervision/assist for medications management;Direct supervision/assist for financial management;Assist for transportation;Help with stairs or ramp for entrance   Can travel by private vehicle   No    Equipment Recommendations Hoyer lift  Recommendations for Other Services       Functional Status Assessment Patient has had a recent decline in their functional status and demonstrates the ability to make significant improvements in function in a reasonable and predictable amount of  time.     Precautions / Restrictions Precautions Precautions: Fall Restrictions Weight Bearing Restrictions Per Provider Order: No      Mobility  Bed Mobility Overal bed mobility: Needs Assistance Bed Mobility: Supine to Sit, Sit to Supine     Supine to sit: Contact guard, HOB elevated Sit to supine: Max assist        Transfers Overall transfer level: Needs assistance Equipment used: 1 person hand held assist Transfers: Sit to/from Stand Sit to Stand: Total assist (attempted to initiate sit to stand however pt's LLE extends and trunk extends with initiation and PT is unable to assist pt in clering buttocks from bed)                Ambulation/Gait                  Stairs            Wheelchair Mobility     Tilt Bed    Modified Rankin (Stroke Patients Only)       Balance Overall balance assessment: Needs assistance Sitting-balance support: No upper extremity supported, Feet supported Sitting balance-Leahy Scale: Fair Sitting balance - Comments: intermittent posterior trunk lean however pt self-corrects Postural control: Posterior lean                                   Pertinent Vitals/Pain Pain Assessment Pain Assessment: No/denies pain    Home Living Family/patient expects to be discharged to:: Private residence Living Arrangements: Spouse/significant other Available Help at Discharge: Family;Available 24 hours/day Type of Home: House Home Access: Stairs to enter Entrance Stairs-Rails: Can reach both Entrance Stairs-Number of Steps: 2   Home Layout: One level  Home Equipment: Agricultural consultant (2 wheels);Wheelchair - manual;BSC/3in1;Hospital bed      Prior Function Prior Level of Function : Needs assist             Mobility Comments: pt reports she primarily stays in bed but that her spouse does assist her in transfers to a wheelchair at times via squat pivot ADLs Comments: pt reports she requires assist for all  IADLs, bathing, dressing, setup for feeding and oral care     Extremity/Trunk Assessment   Upper Extremity Assessment Upper Extremity Assessment: RUE deficits/detail;LUE deficits/detail RUE Deficits / Details: pt with significant BUE coordination deficits, both gross and fine motor impaired. RUE grossly 3+/5 LUE Deficits / Details: pt with significant BUE coordination deficits, both gross and fine motor impaired. LUE grossly 4-/5    Lower Extremity Assessment Lower Extremity Assessment: RLE deficits/detail;LLE deficits/detail RLE Deficits / Details: R BKA, pt with 4-/5 knee extension and flexion RLE Coordination: decreased gross motor;decreased fine motor LLE Deficits / Details: pt with L ankle contracture into PF and inversion, knee flexion/extension at least 4-/5 based on observation LLE Coordination: decreased fine motor;decreased gross motor    Cervical / Trunk Assessment Cervical / Trunk Assessment: Kyphotic  Communication   Communication Communication: Difficulty communicating thoughts/reduced clarity of speech Cueing Techniques: Verbal cues  Cognition Arousal: Alert Behavior During Therapy: Flat affect Overall Cognitive Status: Impaired/Different from baseline Area of Impairment: Orientation, Problem solving                 Orientation Level: Disoriented to, Time (pt initially reports the year as 2025)           Problem Solving: Slow processing, Decreased initiation          General Comments General comments (skin integrity, edema, etc.): VSS on RA, Pt with R back wound as noted in nursing staff notes    Exercises     Assessment/Plan    PT Assessment Patient needs continued PT services  PT Problem List Decreased strength;Decreased activity tolerance;Decreased balance;Decreased mobility;Decreased knowledge of use of DME;Decreased safety awareness;Decreased knowledge of precautions;Decreased coordination;Decreased cognition       PT Treatment  Interventions DME instruction;Functional mobility training;Therapeutic activities;Therapeutic exercise;Balance training;Neuromuscular re-education;Patient/family education;Wheelchair mobility training    PT Goals (Current goals can be found in the Care Plan section)  Acute Rehab PT Goals Patient Stated Goal: to reduce caregiver burden during transfers PT Goal Formulation: With patient Time For Goal Achievement: 04/10/23 Potential to Achieve Goals: Fair    Frequency Min 1X/week     Co-evaluation               AM-PAC PT "6 Clicks" Mobility  Outcome Measure Help needed turning from your back to your side while in a flat bed without using bedrails?: A Little Help needed moving from lying on your back to sitting on the side of a flat bed without using bedrails?: A Little Help needed moving to and from a bed to a chair (including a wheelchair)?: Total Help needed standing up from a chair using your arms (e.g., wheelchair or bedside chair)?: Total Help needed to walk in hospital room?: Total Help needed climbing 3-5 steps with a railing? : Total 6 Click Score: 10    End of Session Equipment Utilized During Treatment: Gait belt Activity Tolerance: Patient tolerated treatment well Patient left: in bed;with call bell/phone within reach;with bed alarm set Nurse Communication: Mobility status;Need for lift equipment PT Visit Diagnosis: Other abnormalities of gait and mobility (R26.89);Other symptoms and  signs involving the nervous system (R29.898);Muscle weakness (generalized) (M62.81)    Time: 4098-1191 PT Time Calculation (min) (ACUTE ONLY): 18 min   Charges:   PT Evaluation $PT Eval Low Complexity: 1 Low   PT General Charges $$ ACUTE PT VISIT: 1 Visit         Arlyss Gandy, PT, DPT Acute Rehabilitation Office [phone]   Arlyss Gandy 03/27/2023, 5:11 PM

## 2023-03-27 NOTE — ED Notes (Signed)
PT at bedside.

## 2023-03-27 NOTE — ED Provider Notes (Signed)
Signout from [place] PA-C at shift change. Briefly, patient presents for inability to care for herself.  Patient has a history of Huntington's disease.  Husband is primary caregiver, but unfortunately is currently being admitted to the hospital.  Therefore patient does not have anyone to help care for her.  Labs and imaging personally reviewed and interpreted including: CBC with mildly elevated white blood cell count, hemoglobin 12.1 normal; BMP with normal electrolytes and kidney function; UA is clean-catch with 11-20 white blood cells, large leuk esterase.  Oral dose of fosfomycin was given for uncomplicated cystitis.  Oral doxycycline was started for cellulitis on back.   Most current vital signs reviewed and are as follows: BP 126/75   Pulse 71   Temp (!) 97.5 F (36.4 C) (Temporal)   Resp 16   SpO2 97%   Plan: Patient not likely to be admitted. Will need social work consultation if there is not family available to care for patient.   5:04 PM patient seen.  She is resting comfortably.  She has some water at bedside which she has been drinking.  She has no complaints.  She confirms that social work has been involved. She will be holding with [place] until suitable placement is found.   Home medications have been ordered by previous team.  BP (!) 118/99   Pulse 92   Temp 98.3 F (36.8 C) (Oral)   Resp 16   SpO2 96%        Renne Crigler, PA-C 03/27/23 1707    Tegeler, Canary Brim, MD 03/27/23 2308

## 2023-03-27 NOTE — ED Notes (Signed)
Pt eating dinner

## 2023-03-28 DIAGNOSIS — R41841 Cognitive communication deficit: Secondary | ICD-10-CM | POA: Diagnosis not present

## 2023-03-28 DIAGNOSIS — E46 Unspecified protein-calorie malnutrition: Secondary | ICD-10-CM | POA: Diagnosis not present

## 2023-03-28 DIAGNOSIS — F4320 Adjustment disorder, unspecified: Secondary | ICD-10-CM | POA: Diagnosis not present

## 2023-03-28 DIAGNOSIS — R519 Headache, unspecified: Secondary | ICD-10-CM | POA: Diagnosis not present

## 2023-03-28 DIAGNOSIS — R2681 Unsteadiness on feet: Secondary | ICD-10-CM | POA: Diagnosis not present

## 2023-03-28 DIAGNOSIS — M419 Scoliosis, unspecified: Secondary | ICD-10-CM | POA: Diagnosis not present

## 2023-03-28 DIAGNOSIS — R404 Transient alteration of awareness: Secondary | ICD-10-CM | POA: Diagnosis not present

## 2023-03-28 DIAGNOSIS — F331 Major depressive disorder, recurrent, moderate: Secondary | ICD-10-CM | POA: Diagnosis not present

## 2023-03-28 DIAGNOSIS — F3341 Major depressive disorder, recurrent, in partial remission: Secondary | ICD-10-CM | POA: Diagnosis not present

## 2023-03-28 DIAGNOSIS — G9389 Other specified disorders of brain: Secondary | ICD-10-CM | POA: Diagnosis not present

## 2023-03-28 DIAGNOSIS — G8929 Other chronic pain: Secondary | ICD-10-CM | POA: Diagnosis not present

## 2023-03-28 DIAGNOSIS — G10 Huntington's disease: Secondary | ICD-10-CM | POA: Diagnosis not present

## 2023-03-28 DIAGNOSIS — W19XXXA Unspecified fall, initial encounter: Secondary | ICD-10-CM | POA: Diagnosis not present

## 2023-03-28 DIAGNOSIS — R2689 Other abnormalities of gait and mobility: Secondary | ICD-10-CM | POA: Diagnosis not present

## 2023-03-28 DIAGNOSIS — R9082 White matter disease, unspecified: Secondary | ICD-10-CM | POA: Diagnosis not present

## 2023-03-28 DIAGNOSIS — M542 Cervicalgia: Secondary | ICD-10-CM | POA: Diagnosis not present

## 2023-03-28 DIAGNOSIS — Z7401 Bed confinement status: Secondary | ICD-10-CM | POA: Diagnosis not present

## 2023-03-28 DIAGNOSIS — K219 Gastro-esophageal reflux disease without esophagitis: Secondary | ICD-10-CM | POA: Diagnosis not present

## 2023-03-28 DIAGNOSIS — R531 Weakness: Secondary | ICD-10-CM | POA: Diagnosis not present

## 2023-03-28 DIAGNOSIS — Z9181 History of falling: Secondary | ICD-10-CM | POA: Diagnosis not present

## 2023-03-28 DIAGNOSIS — M6259 Muscle wasting and atrophy, not elsewhere classified, multiple sites: Secondary | ICD-10-CM | POA: Diagnosis not present

## 2023-03-28 DIAGNOSIS — L0390 Cellulitis, unspecified: Secondary | ICD-10-CM | POA: Diagnosis not present

## 2023-03-28 DIAGNOSIS — L03312 Cellulitis of back [any part except buttock]: Secondary | ICD-10-CM | POA: Diagnosis not present

## 2023-03-28 DIAGNOSIS — F413 Other mixed anxiety disorders: Secondary | ICD-10-CM | POA: Diagnosis not present

## 2023-03-28 DIAGNOSIS — M6281 Muscle weakness (generalized): Secondary | ICD-10-CM | POA: Diagnosis not present

## 2023-03-28 DIAGNOSIS — R1312 Dysphagia, oropharyngeal phase: Secondary | ICD-10-CM | POA: Diagnosis not present

## 2023-03-28 DIAGNOSIS — W010XXA Fall on same level from slipping, tripping and stumbling without subsequent striking against object, initial encounter: Secondary | ICD-10-CM | POA: Diagnosis not present

## 2023-03-28 DIAGNOSIS — F419 Anxiety disorder, unspecified: Secondary | ICD-10-CM | POA: Diagnosis not present

## 2023-03-28 DIAGNOSIS — F32A Depression, unspecified: Secondary | ICD-10-CM | POA: Diagnosis not present

## 2023-03-28 DIAGNOSIS — Z89511 Acquired absence of right leg below knee: Secondary | ICD-10-CM | POA: Diagnosis not present

## 2023-03-28 DIAGNOSIS — F339 Major depressive disorder, recurrent, unspecified: Secondary | ICD-10-CM | POA: Diagnosis not present

## 2023-03-28 DIAGNOSIS — M810 Age-related osteoporosis without current pathological fracture: Secondary | ICD-10-CM | POA: Diagnosis not present

## 2023-03-28 DIAGNOSIS — Z7409 Other reduced mobility: Secondary | ICD-10-CM | POA: Diagnosis not present

## 2023-03-28 DIAGNOSIS — M549 Dorsalgia, unspecified: Secondary | ICD-10-CM | POA: Diagnosis not present

## 2023-03-28 DIAGNOSIS — D72829 Elevated white blood cell count, unspecified: Secondary | ICD-10-CM | POA: Diagnosis not present

## 2023-03-28 DIAGNOSIS — R000 Tachycardia, unspecified: Secondary | ICD-10-CM | POA: Diagnosis not present

## 2023-03-28 MED ORDER — MUPIROCIN CALCIUM 2 % EX CREA
TOPICAL_CREAM | Freq: Two times a day (BID) | CUTANEOUS | Status: DC
  Filled 2023-03-28: qty 15

## 2023-03-28 MED ORDER — NYSTATIN 100000 UNIT/GM EX POWD
Freq: Two times a day (BID) | CUTANEOUS | Status: DC
  Filled 2023-03-28: qty 15

## 2023-03-28 MED ORDER — DOXYCYCLINE HYCLATE 100 MG PO CAPS: 100.0000 mg | ORAL_CAPSULE | Freq: Two times a day (BID) | ORAL | 0 refills | Status: AC

## 2023-03-28 NOTE — Discharge Instructions (Signed)
The patient's rash was concerning for a skin infection for which she is being discharged on antibiotics.  Her urinalysis was also concerning for possible urinary tract infection, you received a single dose of fosfomycin and we are culturing your urine.  You should follow-up with the provider at your facility and your primary care provider.  Return for new or worsening symptoms.

## 2023-03-28 NOTE — Progress Notes (Addendum)
1:55pm: CSW spke with [place] who states patient will go to room 308 at [facility]. Patient will travel via [facility] - RN to call when ready. The number for report is [phone].  10:50am: CSW received bed offer from [facility].  CSW spoke with Mikle Bosworth, RN who states patient is only oriented to self at this time.  CSW spoke with Jess, LCSW on 6N to request she speak with patient's husband who is admitted to present him with bed offers. Jess states patient's husband is agreeable to accept bed offer from [facility].  CSW spoke with [place] at [facility] who states the facility can accept her today. Nelma Rothman states she is currently out of the building but will contact CSW upon her return to the facility.  CSW notified MD and RN of plan.  Edwin Dada, MSW, LCSW Transitions of Care  Clinical Social Worker II [phone]

## 2023-03-28 NOTE — Consult Note (Addendum)
WOC Nurse Consult Note: Reason for Consult: Consult requested for right breast fold.  Performed remotely after review of progress notes and photos in the EMR.  Appearance and location are consistent with a wound which began as moisture associated skin damage and candidiasis, and evolved into full thickness skin loss.  Right posterior/outer breast skin fold with full thickness wound, 90% red, 10% yellow, mod amt tan drainage and strong odor. Pt is on systemic antibiotics for cellulitis.  Dressing procedure/placement/frequency: Topical treatment orders provided for bedside nurses to perform as follows to absorb drainage and provide antimicrobial benefits: Apply a piece of Aquacel [place] # [phone]) to right posterior fold underneath breast and cover with foam dressing.  Change Q Thurs/Sat/Mon. Please re-consult if further assistance is needed.  Thank-you,  Cammie Mcgee MSN, RN, CWOCN, [place], CNS [phone]

## 2023-03-28 NOTE — ED Provider Notes (Signed)
Emergency Medicine Observation Re-evaluation Note  Natasha Frederick is a 67 y.o. female, seen on rounds today.  Pt initially presented to the ED for complaints of Self-Care deficit Currently, the patient is awaiting TOC assistance for possible placement.  Physical Exam  BP 130/72   Pulse 92   Temp 98 F (36.7 C) (Oral)   Resp 16   Ht 5\' 4"  (1.626 m)   Wt 67.6 kg   SpO2 95%   BMI 25.58 kg/m  Physical Exam General: Resting comfortably in bed, awakens easily Lungs: Normal respiratory effort Psych: Calm, cooperative  ED Course / MDM  EKG:   I have reviewed the labs performed to date as well as medications administered while in observation.  Recent changes in the last 24 hours include no changes.  Plan  Current plan is for [facility] evaluation for possible placement.    Laurence Spates, MD 03/28/23 [phone]

## 2023-03-28 NOTE — ED Provider Notes (Signed)
Blood pressure 130/72, pulse 92, temperature 98 F (36.7 C), temperature source Oral, resp. rate 16, height 5\' 4"  (1.626 m), weight 67.6 kg, SpO2 95%.   In short, Natasha Frederick is a 67 y.o. female with a chief complaint of Self-Care deficit .  Refer to the original H&P for additional details.  06:13 AM  Aware by nursing staff that the patient has a wound on her right, mid back.  It appears as if she has a linear area of skin breakdown along a skin fold in that area.  This appears to be where her bra would normally rest on the skin.  Minimal if any discharge and some of the surrounding erythema is seen both here as well as the groin.  Does not appear to have significant cellulitis or abscess.  I will start the patient on wound care orders, consult our wound care team, begin topical antibiotic. No SIRS vitals. Doubt sepsis. Patient with yeast type skin changes in the groin area. Will order nystatin powder. Do not see a clear indication for medical admit. Continue plan for TOC evaluation and placement.     Maia Plan, MD 03/28/23 [phone]

## 2023-03-28 NOTE — ED Notes (Signed)
Wheelchair was delivered to husband on room [phone], given to [employer]

## 2023-03-28 NOTE — ED Notes (Signed)
Wound care was performed, Wound rinsed with Vashe, aquacel placed and covered with Mepilex border.

## 2023-03-28 NOTE — ED Notes (Addendum)
Patient has redness to right upper flank area to bra line that is open without drainage. Foul odor noted to area. MD made aware. Area cleaned and cover. Redness noted to perineal area with foul odor. Incontinent care given, barrier cream applied.

## 2023-03-29 DIAGNOSIS — G10 Huntington's disease: Secondary | ICD-10-CM | POA: Diagnosis not present

## 2023-03-29 DIAGNOSIS — M6259 Muscle wasting and atrophy, not elsewhere classified, multiple sites: Secondary | ICD-10-CM | POA: Diagnosis not present

## 2023-03-29 DIAGNOSIS — F339 Major depressive disorder, recurrent, unspecified: Secondary | ICD-10-CM | POA: Diagnosis not present

## 2023-03-29 DIAGNOSIS — M6281 Muscle weakness (generalized): Secondary | ICD-10-CM | POA: Diagnosis not present

## 2023-03-29 LAB — URINE CULTURE: Culture: <10000 — AB

## 2023-04-01 ENCOUNTER — Emergency Department (HOSPITAL_COMMUNITY)
Admission: EM | Admit: 2023-04-01 | Discharge: 2023-04-01 | Disposition: A | Discharge status: Skilled Nursing Facility | Payer: Medicare Other | Attending: Emergency Medicine | Admitting: Emergency Medicine

## 2023-04-01 ENCOUNTER — Emergency Department (HOSPITAL_COMMUNITY): Payer: Medicare Other

## 2023-04-01 ENCOUNTER — Other Ambulatory Visit: Payer: Self-pay

## 2023-04-01 ENCOUNTER — Encounter (HOSPITAL_COMMUNITY): Payer: Self-pay

## 2023-04-01 DIAGNOSIS — W19XXXA Unspecified fall, initial encounter: Secondary | ICD-10-CM

## 2023-04-01 DIAGNOSIS — M810 Age-related osteoporosis without current pathological fracture: Secondary | ICD-10-CM | POA: Diagnosis not present

## 2023-04-01 DIAGNOSIS — M542 Cervicalgia: Secondary | ICD-10-CM | POA: Diagnosis not present

## 2023-04-01 DIAGNOSIS — W010XXA Fall on same level from slipping, tripping and stumbling without subsequent striking against object, initial encounter: Secondary | ICD-10-CM | POA: Insufficient documentation

## 2023-04-01 DIAGNOSIS — G9389 Other specified disorders of brain: Secondary | ICD-10-CM | POA: Diagnosis not present

## 2023-04-01 DIAGNOSIS — R519 Headache, unspecified: Secondary | ICD-10-CM | POA: Insufficient documentation

## 2023-04-01 DIAGNOSIS — M6281 Muscle weakness (generalized): Secondary | ICD-10-CM | POA: Diagnosis not present

## 2023-04-01 DIAGNOSIS — R9082 White matter disease, unspecified: Secondary | ICD-10-CM | POA: Diagnosis not present

## 2023-04-01 DIAGNOSIS — G10 Huntington's disease: Secondary | ICD-10-CM | POA: Diagnosis not present

## 2023-04-01 DIAGNOSIS — M6259 Muscle wasting and atrophy, not elsewhere classified, multiple sites: Secondary | ICD-10-CM | POA: Diagnosis not present

## 2023-04-01 NOTE — Discharge Instructions (Signed)
Thank you for letting us evaluate you today.  Your scans of your head and neck were negative for fractures or bleeding on the brain.  Please return to emergency department if you experience numbness or tingling in one-sided body, slurred speech, facial droop

## 2023-04-01 NOTE — ED Provider Notes (Signed)
[place] EMERGENCY DEPARTMENT AT [facility] Provider Note   CSN: [id number] Arrival date & time: 04/01/23  1606     History  No chief complaint on file.   Natasha Frederick is a 67 y.o. female with past medical history of scoliosis, OP, GERD, BKA of RLE, Huntington's disease presents to emergency department following unwitnessed fall from [facility] via EMS.  They report that staff found her on the floor and has been at her baseline since.  She reports that she was "carrying too many things" when she tripped and fell onto ground. She denies complaints prior to fall.   She currently complains of posterior headache but denies LOC or visual disturbances.  The history is provided by the patient, the EMS personnel and a caregiver. The history is limited by the condition of the patient. No language interpreter was used.     Home Medications Prior to Admission medications   Medication Sig Start Date End Date Taking? Authorizing Provider  acetaminophen (TYLENOL) 500 MG tablet Take 2,000 mg by mouth in the morning.    [provider]  amantadine (SYMMETREL) 100 MG capsule Take 100 mg by mouth 2 (two) times daily. 11/02/22   [provider]  doxycycline (VIBRAMYCIN) 100 MG capsule Take 1 capsule (100 mg total) by mouth 2 (two) times daily for 7 days. 03/28/23 04/04/23  Laurence Spates, MD  fluorometholone (FML) 0.1 % ophthalmic suspension Place 1 drop into both eyes 2 (two) times daily. Patient not taking: Reported on 03/27/2023 01/09/23   [provider]  MYRBETRIQ 25 MG TB24 tablet Take 25 mg by mouth daily. 12/11/22   [provider]  RESTASIS 0.05 % ophthalmic emulsion Place 1 drop into both eyes 2 (two) times daily. 02/12/23   [provider]  risperidone (RISPERDAL) 4 MG tablet Take 4 mg by mouth 2 (two) times daily.    [provider]  sertraline (ZOLOFT) 100 MG tablet TAKE 1 TABLET(100 MG) BY MOUTH DAILY Patient taking  differently: Take 100 mg by mouth at bedtime. TAKE 1 TABLET(100 MG) BY MOUTH DAILY 02/05/23   Burchette, Elberta Fortis, MD  Vibegron (GEMTESA) 75 MG TABS Take 1 tablet by mouth daily Patient taking differently: Take 75 mg by mouth at bedtime. Take 1 tablet by mouth daily 10/24/22   [place], Betty G, MD      Allergies    Bee venom, Nsaids, Adhesive [tape], Codeine, Penicillins, and Sulfa antibiotics    Review of Systems   Review of Systems  Constitutional:  Negative for chills, fatigue and fever.  Respiratory:  Negative for cough, chest tightness, shortness of breath and wheezing.   Cardiovascular:  Negative for chest pain and palpitations.  Gastrointestinal:  Negative for abdominal pain, constipation, diarrhea, nausea and vomiting.  Neurological:  Positive for headaches. Negative for dizziness, seizures, weakness, light-headedness and numbness.    Physical Exam Updated Vital Signs BP (!) 147/87 (BP Location: Left Arm)   Pulse 85   Temp 97.9 F (36.6 C)   Resp 16   SpO2 93%  Physical Exam Vitals and nursing note reviewed.  Constitutional:      General: She is not in acute distress.    Appearance: Normal appearance. She is not diaphoretic.  HENT:     Head: Normocephalic and atraumatic.     Comments: No hematoma nor TTP of cranium No crepitus to facial bones    Right Ear: External ear normal. No hemotympanum.     Left Ear: External ear normal.  No hemotympanum.     Nose: Nose normal.     Right Nostril: No epistaxis or septal hematoma.     Left Nostril: No epistaxis or septal hematoma.     Mouth/Throat:     Mouth: Mucous membranes are moist. No injury or lacerations.  Eyes:     General: No visual field deficit.       Right eye: No discharge.        Left eye: No discharge.     Extraocular Movements: Extraocular movements intact.     Conjunctiva/sclera: Conjunctivae normal.     Pupils: Pupils are equal, round, and reactive to light.     Comments: No subconjunctival hemorrhage,  hyphema, tear drop pupil, or fluid leakage bilaterally  Neck:     Vascular: No carotid bruit.  Cardiovascular:     Rate and Rhythm: Normal rate.     Pulses: Normal pulses.          Radial pulses are 2+ on the right side and 2+ on the left side.       Popliteal pulses are 2+ on the right side and 2+ on the left side.  Pulmonary:     Effort: Pulmonary effort is normal. No respiratory distress.     Breath sounds: Normal breath sounds. No wheezing.  Chest:     Chest wall: No tenderness.  Abdominal:     General: Bowel sounds are normal. There is no distension.     Palpations: Abdomen is soft.     Tenderness: There is no abdominal tenderness. There is no guarding or rebound.  Musculoskeletal:     Cervical back: Full passive range of motion without pain, normal range of motion and neck supple. No deformity, rigidity or bony tenderness. Normal range of motion.     Thoracic back: No deformity or bony tenderness. Normal range of motion.     Lumbar back: No deformity or bony tenderness. Normal range of motion.     Right hip: No bony tenderness or crepitus.     Left hip: No bony tenderness or crepitus.     Right lower leg: No edema.     Left lower leg: No edema.     Comments: Pelvis stable with no shortening or rotation of LE bilaterally Chronic contracture that is cool to touch and pale of left foot Chronic contracture of right hand     Right Lower Extremity: Right leg is amputated below knee.  Skin:    General: Skin is warm and dry.     Capillary Refill: Capillary refill takes less than 2 seconds.  Neurological:     General: No focal deficit present.     Mental Status: She is alert and oriented to person, place, and time. Mental status is at baseline.     GCS: GCS eye subscore is 4. GCS verbal subscore is 5. GCS motor subscore is 6.     Cranial Nerves: No facial asymmetry.     Sensory: Sensation is intact. No sensory deficit.     Motor: Motor function is intact. No weakness, tremor or  seizure activity.     Coordination: Coordination normal. Finger-Nose-Finger Test normal.     Comments: Full and complete neuro exam is limited due to Huntington's disease. She is following commands appropriately. Fully alert and oriented.  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 Multiplanar CT image reconstructions of the cervical spine were also generated. RADIATION DOSE REDUCTION: This exam was performed according to the departmental dose-optimization program which includes automated exposure control, adjustment of the mA and/or kV according to patient size and/or use of iterative reconstruction technique. COMPARISON:  09/05/2018 FINDINGS: CT HEAD FINDINGS Brain: No evidence of acute infarction, hemorrhage, hydrocephalus, extra-axial collection or mass lesion/mass effect. Unchanged encephalomalacia of the frontal poles (series 2, image 7). Periventricular white matter hypodensity. Vascular: No hyperdense vessel or unexpected calcification. Skull: Normal. Negative for fracture or focal lesion. Sinuses/Orbits: No acute finding. Other: None. CT CERVICAL SPINE FINDINGS Alignment: Severe reversal of the normal cervical lordosis. Skull base and vertebrae: No acute fracture. No primary bone lesion or focal pathologic process. Soft tissues and spinal canal: No prevertebral fluid or swelling. No visible canal hematoma. 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 Other: None. IMPRESSION: 1. No acute intracranial pathology. Small-vessel white matter disease and unchanged encephalomalacia of the frontal poles. 2. No fracture or static subluxation of the cervical spine. 3. 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 Electronically Signed   By: Jearld Lesch M.D.   On: 04/01/2023 18:06   CT Cervical Spine Wo Contrast Result Date: 04/01/2023 CLINICAL DATA:  Unwitnessed fall, headache EXAM: CT HEAD WITHOUT CONTRAST CT CERVICAL SPINE WITHOUT CONTRAST TECHNIQUE: Multidetector CT imaging of the head and cervical spine was performed following the standard protocol without intravenous contrast. Multiplanar CT image reconstructions of the cervical spine were also generated. RADIATION DOSE REDUCTION: This exam was performed according to the departmental dose-optimization program which includes automated exposure control, adjustment of the mA and/or kV according to patient size and/or use of iterative reconstruction technique. COMPARISON:  09/05/2018 FINDINGS: CT HEAD FINDINGS Brain: No evidence of acute infarction, hemorrhage, hydrocephalus, extra-axial collection or mass lesion/mass effect. Unchanged encephalomalacia of the frontal poles (series 2, image 7). Periventricular white matter hypodensity. Vascular: No hyperdense vessel or unexpected calcification. Skull: Normal. Negative for fracture or focal lesion. Sinuses/Orbits: No acute finding. Other: None. CT CERVICAL SPINE FINDINGS Alignment: Severe reversal of the normal cervical lordosis. Skull base and vertebrae: No acute fracture. No primary bone lesion or focal pathologic process. Soft tissues and spinal canal: No prevertebral fluid or swelling. No visible canal hematoma. 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 Other: None. IMPRESSION: 1. No acute  intracranial pathology. Small-vessel white matter disease and unchanged encephalomalacia of the frontal poles. 2. No fracture or static subluxation of the cervical spine. 3. 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 Electronically Signed   By: Jearld Lesch M.D.   On: 04/01/2023 18:06    Procedures Procedures    Medications Ordered in ED Medications - No data to display  ED Course/ Medical Decision Making/ A&P  Medical Decision Making Amount and/or Complexity of Data Reviewed Radiology: ordered.   Patient presents to the ED for concern of headache following unwitnessed fall, this involves an extensive number of treatment options, and is a complaint that carries with it a high risk of complications and morbidity.  The differential diagnosis includes fx, ICH, sprain. Not an exhaustive list   Co morbidities that complicate the patient evaluation  Huntington's Disease   Additional history obtained:  Additional history obtained from EMS, Nursing, and Outside Medical Records   External records from outside source obtained and reviewed including multiple office visits for ataxic gait    Imaging Studies ordered:  I ordered imaging studies including CT head and neck  I independently visualized and interpreted imaging which showed no ICH, fx I agree with the radiologist interpretation     Problem List / ED Course:  Fall Facility and EMS report that patient has remained at her baseline.  No seizure-like activity was noted Initially, she complained of mild posterior headache however this quickly resolved.  She is neurologically at her baseline and fully alert and oriented.  She denies visual disturbances or changes in her normal neurological deficits secondary to Huntington's disease CT is negative for fracture or ICH Discussed findings, return to emergency department precautions to patient expresses  understanding agrees with plan.  All questions answered to her satisfaction.  She is amenable to discharge.   Reevaluation:  After the interventions noted above, I reevaluated the patient and found that they have :improved   Social Determinants of Health:  Lives in rehabilitation center as she is unable to complete her ADLs   Dispostion:  After consideration of the diagnostic results and the patients response to treatment, I feel that the patent would benefit from outpatient follow-up.   Discussed patient, findings, plan, disposition with Dr. Dalene Seltzer who agrees with plan Final Clinical Impression(s) / ED Diagnoses Final diagnoses:  Fall, initial encounter    Rx / DC Orders ED Discharge Orders     None        Judithann Sheen, PA 04/01/23 Ebony Cargo    Alvira Monday, MD 04/02/23 1104

## 2023-04-01 NOTE — ED Triage Notes (Signed)
Pt from [place] for unwitnessed fall, staff found her on the floor. Pt c.o headache, denies LOC or taking any blood thinners. VSS no obvious injuries.

## 2023-04-04 DIAGNOSIS — W19XXXA Unspecified fall, initial encounter: Secondary | ICD-10-CM | POA: Diagnosis not present

## 2023-04-04 DIAGNOSIS — M810 Age-related osteoporosis without current pathological fracture: Secondary | ICD-10-CM | POA: Diagnosis not present

## 2023-04-04 DIAGNOSIS — G10 Huntington's disease: Secondary | ICD-10-CM | POA: Diagnosis not present

## 2023-04-04 DIAGNOSIS — M6281 Muscle weakness (generalized): Secondary | ICD-10-CM | POA: Diagnosis not present

## 2023-04-08 DIAGNOSIS — M6259 Muscle wasting and atrophy, not elsewhere classified, multiple sites: Secondary | ICD-10-CM | POA: Diagnosis not present

## 2023-04-08 DIAGNOSIS — W19XXXA Unspecified fall, initial encounter: Secondary | ICD-10-CM | POA: Diagnosis not present

## 2023-04-08 DIAGNOSIS — G10 Huntington's disease: Secondary | ICD-10-CM | POA: Diagnosis not present

## 2023-04-08 DIAGNOSIS — M6281 Muscle weakness (generalized): Secondary | ICD-10-CM | POA: Diagnosis not present

## 2023-04-10 DIAGNOSIS — F331 Major depressive disorder, recurrent, moderate: Secondary | ICD-10-CM | POA: Diagnosis not present

## 2023-04-10 DIAGNOSIS — R2681 Unsteadiness on feet: Secondary | ICD-10-CM | POA: Diagnosis not present

## 2023-04-10 DIAGNOSIS — G10 Huntington's disease: Secondary | ICD-10-CM | POA: Diagnosis not present

## 2023-04-10 DIAGNOSIS — R1312 Dysphagia, oropharyngeal phase: Secondary | ICD-10-CM | POA: Diagnosis not present

## 2023-04-10 DIAGNOSIS — Z9181 History of falling: Secondary | ICD-10-CM | POA: Diagnosis not present

## 2023-04-10 DIAGNOSIS — L03312 Cellulitis of back [any part except buttock]: Secondary | ICD-10-CM | POA: Diagnosis not present

## 2023-04-10 DIAGNOSIS — M6281 Muscle weakness (generalized): Secondary | ICD-10-CM | POA: Diagnosis not present

## 2023-04-12 ENCOUNTER — Other Ambulatory Visit: Payer: Self-pay | Admitting: *Deleted

## 2023-04-12 DIAGNOSIS — F4320 Adjustment disorder, unspecified: Secondary | ICD-10-CM | POA: Diagnosis not present

## 2023-04-12 NOTE — Patient Outreach (Addendum)
 Mrs. Ousley recently admitted to [facility] under the [facility] SNF waiver.   Secure communication sent to [employer] social work team, sales promotion account executive, and Administrator to inquire about transition plans and needs..  Addendum: Update received from [name], SNF social worker stating transition plan is for LTC at another facility. Currently receiving rehab.   Will continue to follow.   Pablo Hurst, MSN, RN, BSN [place]  [facility], [facility] RN Post- Acute Care Manager Direct Dial: [phone]

## 2023-04-16 DIAGNOSIS — G10 Huntington's disease: Secondary | ICD-10-CM | POA: Diagnosis not present

## 2023-04-16 DIAGNOSIS — F3341 Major depressive disorder, recurrent, in partial remission: Secondary | ICD-10-CM | POA: Diagnosis not present

## 2023-04-16 DIAGNOSIS — K219 Gastro-esophageal reflux disease without esophagitis: Secondary | ICD-10-CM | POA: Diagnosis not present

## 2023-04-17 DIAGNOSIS — R2681 Unsteadiness on feet: Secondary | ICD-10-CM | POA: Diagnosis not present

## 2023-04-17 DIAGNOSIS — Z9181 History of falling: Secondary | ICD-10-CM | POA: Diagnosis not present

## 2023-04-17 DIAGNOSIS — M6281 Muscle weakness (generalized): Secondary | ICD-10-CM | POA: Diagnosis not present

## 2023-04-17 DIAGNOSIS — F331 Major depressive disorder, recurrent, moderate: Secondary | ICD-10-CM | POA: Diagnosis not present

## 2023-04-17 DIAGNOSIS — G10 Huntington's disease: Secondary | ICD-10-CM | POA: Diagnosis not present

## 2023-04-17 DIAGNOSIS — R1312 Dysphagia, oropharyngeal phase: Secondary | ICD-10-CM | POA: Diagnosis not present

## 2023-04-17 DIAGNOSIS — L03312 Cellulitis of back [any part except buttock]: Secondary | ICD-10-CM | POA: Diagnosis not present

## 2023-04-18 ENCOUNTER — Telehealth: Payer: Self-pay | Admitting: *Deleted

## 2023-04-18 DIAGNOSIS — F32A Depression, unspecified: Secondary | ICD-10-CM | POA: Diagnosis not present

## 2023-04-18 DIAGNOSIS — F4320 Adjustment disorder, unspecified: Secondary | ICD-10-CM | POA: Diagnosis not present

## 2023-04-18 DIAGNOSIS — F413 Other mixed anxiety disorders: Secondary | ICD-10-CM | POA: Diagnosis not present

## 2023-04-18 NOTE — Telephone Encounter (Signed)
 Copied from CRM [id number]. Topic: Clinical - Prescription Issue >> Apr 18, 2023  2:57 PM Evie B wrote: Reason for CRM: Ozell RAMAN. From [facility] called regarding a faxed sent for a blood pressure cuff. Requesting provider approval. Please call back at [phone]. [facility] is also requesting [facility]. To be faxed to [phone]

## 2023-04-19 ENCOUNTER — Telehealth: Payer: Self-pay

## 2023-04-19 NOTE — Telephone Encounter (Signed)
 Left message for patient's husband to return call. Cologuard that he inquired about has already been completed. From notes, Elida, NP spoke with him on Monday 1/6 regarding a colonoscopy & that he would get back to us  once he discusses it with his wife's medical team. Refer back to Cologuard result note.

## 2023-04-19 NOTE — Telephone Encounter (Signed)
 Unable to reach patient's husband, cell rings then goes to busy signal. Will try again at a later time.

## 2023-04-19 NOTE — Telephone Encounter (Signed)
 Patient's husband stopped by the office to drop off a copy of [facility] POA forms. Antoine has uploaded forms into patient's chart. He also inquired about cologuard, states he has not heard from anyone regarding kit. Advised him I would reach out to Cologuard, and will be back in touch with him.

## 2023-04-22 ENCOUNTER — Telehealth: Payer: Self-pay

## 2023-04-22 NOTE — Telephone Encounter (Signed)
 Copied from CRM [id number]. Topic: General - Other >> Apr 19, 2023  2:20 PM Brandy W wrote: Reason for CRM: Natasha Frederick- [facility].    Stated fax was previously received with approval and signature for pts blood pressure monitor, however they requested the visit notes from appt and they are missing. Natasha Frederick is requesting those be faxed over to [phone].  Callback # [phone]

## 2023-04-22 NOTE — Telephone Encounter (Signed)
OV notes faxed  

## 2023-04-23 NOTE — Telephone Encounter (Signed)
 Left message on home voicemail for Mr.Rickard Rhymes to call back.

## 2023-04-24 ENCOUNTER — Other Ambulatory Visit: Payer: Self-pay | Admitting: *Deleted

## 2023-04-24 DIAGNOSIS — G10 Huntington's disease: Secondary | ICD-10-CM | POA: Diagnosis not present

## 2023-04-24 DIAGNOSIS — R1312 Dysphagia, oropharyngeal phase: Secondary | ICD-10-CM | POA: Diagnosis not present

## 2023-04-24 DIAGNOSIS — L03312 Cellulitis of back [any part except buttock]: Secondary | ICD-10-CM | POA: Diagnosis not present

## 2023-04-24 DIAGNOSIS — Z9181 History of falling: Secondary | ICD-10-CM | POA: Diagnosis not present

## 2023-04-24 DIAGNOSIS — F331 Major depressive disorder, recurrent, moderate: Secondary | ICD-10-CM | POA: Diagnosis not present

## 2023-04-24 DIAGNOSIS — M6281 Muscle weakness (generalized): Secondary | ICD-10-CM | POA: Diagnosis not present

## 2023-04-24 DIAGNOSIS — R2681 Unsteadiness on feet: Secondary | ICD-10-CM | POA: Diagnosis not present

## 2023-04-24 NOTE — Patient Outreach (Signed)
 Natasha Frederick resides in [facility]. [facility] SNF waiver was utilized to admit to [facility] for skilled therapy.   Collaboration with [name], [facility] social worker. Transition plan likely ALF. Spouse is touring ALF today.   Writer to follow up next week on transition date/plans.   Nolberto Batty, MSN, RN, BSN [place]  [facility], [facility] Commun[facility] RN Post- Acute Care Manager Direct Dial: [phone]

## 2023-04-25 NOTE — Telephone Encounter (Signed)
Spoke with patient's husband & he was requesting a copy of cologuard results to give to wife's careteam prior to scheduling colonoscopy. Copy placed at second floor front desk, and he will come by and pick it up.

## 2023-04-25 NOTE — Telephone Encounter (Signed)
Left message for patient's husband to return call.

## 2023-04-25 NOTE — Telephone Encounter (Signed)
Patient husband is returning a call to [place]. Patient husband would like a cal back. Please advise.

## 2023-04-30 DIAGNOSIS — G10 Huntington's disease: Secondary | ICD-10-CM | POA: Diagnosis not present

## 2023-04-30 DIAGNOSIS — K219 Gastro-esophageal reflux disease without esophagitis: Secondary | ICD-10-CM | POA: Diagnosis not present

## 2023-04-30 DIAGNOSIS — F339 Major depressive disorder, recurrent, unspecified: Secondary | ICD-10-CM | POA: Diagnosis not present

## 2023-04-30 DIAGNOSIS — F3341 Major depressive disorder, recurrent, in partial remission: Secondary | ICD-10-CM | POA: Diagnosis not present

## 2023-04-30 DIAGNOSIS — M810 Age-related osteoporosis without current pathological fracture: Secondary | ICD-10-CM | POA: Diagnosis not present

## 2023-04-30 DIAGNOSIS — M6281 Muscle weakness (generalized): Secondary | ICD-10-CM | POA: Diagnosis not present

## 2023-04-30 DIAGNOSIS — M6259 Muscle wasting and atrophy, not elsewhere classified, multiple sites: Secondary | ICD-10-CM | POA: Diagnosis not present

## 2023-05-03 ENCOUNTER — Telehealth: Payer: Self-pay | Admitting: Family Medicine

## 2023-05-03 ENCOUNTER — Other Ambulatory Visit: Payer: Self-pay | Admitting: *Deleted

## 2023-05-03 MED ORDER — NYSTATIN 100000 UNIT/GM EX CREA: TOPICAL_CREAM | CUTANEOUS | 0 refills | Status: DC

## 2023-05-03 MED ORDER — QUETIAPINE FUMARATE 25 MG PO TABS: ORAL_TABLET | ORAL | 2 refills | Status: DC

## 2023-05-03 NOTE — Telephone Encounter (Signed)
Patient has a bad diaper rash and needs medication for agitation behavior problems           fairy   memory care [phone]    fax number [phone]

## 2023-05-03 NOTE — Telephone Encounter (Signed)
Candiss Norse called back in regarding patient medications as well she would like a call back regarding this

## 2023-05-03 NOTE — Patient Outreach (Signed)
Post-Acute Care Manager follow up. Mrs. Leap recently admitted to [facility] under [facility] SNF waiver.   SNF social worker previously indicated transition plan was for [facility]. Secure communication sent to [name] to confirm discharge date from [facility].  Raiford Noble, MSN, RN, BSN [place]  [facility], [facility] Commun[facility] RN Post- Acute Care Manager Direct Dial: [phone]

## 2023-05-03 NOTE — Addendum Note (Signed)
Addended by: Christy Sartorius on: 05/03/2023 05:02 PM   Modules accepted: Orders

## 2023-05-03 NOTE — Telephone Encounter (Signed)
I spoke with Candiss Norse with [facility] and she reported the patient has been experiencing agitation and involuntary movement at night time. Candiss Norse inquired if a prescription for Seroquel can be sent in for the patient. Fairly also reported the patient has skin breakdown on flank due to diaper rash and inquired if an ointment can be called in for this issue. Candiss Norse stated she received a call from the patient's pharmacy stating the patient is taking both Mybetriq and Gemtesa and wanted clarification on which one she should be taking.

## 2023-05-03 NOTE — Telephone Encounter (Signed)
Left a message for Candiss Norse to return my call.

## 2023-05-03 NOTE — Telephone Encounter (Signed)
I spoke with Candiss Norse and she reported they do have a MD at [facility] but patient's husband declined for the patient to be seen by MD and prefers PCP to handle care.

## 2023-05-03 NOTE — Telephone Encounter (Signed)
Fairy informed of the message below. Rx sent and faxed

## 2023-05-08 ENCOUNTER — Other Ambulatory Visit: Payer: Self-pay | Admitting: Family Medicine

## 2023-05-09 DIAGNOSIS — K219 Gastro-esophageal reflux disease without esophagitis: Secondary | ICD-10-CM | POA: Diagnosis not present

## 2023-05-09 DIAGNOSIS — K5900 Constipation, unspecified: Secondary | ICD-10-CM | POA: Diagnosis not present

## 2023-05-09 DIAGNOSIS — G10 Huntington's disease: Secondary | ICD-10-CM | POA: Diagnosis not present

## 2023-05-09 DIAGNOSIS — F411 Generalized anxiety disorder: Secondary | ICD-10-CM | POA: Diagnosis not present

## 2023-05-09 DIAGNOSIS — F32A Depression, unspecified: Secondary | ICD-10-CM | POA: Diagnosis not present

## 2023-05-10 ENCOUNTER — Other Ambulatory Visit: Payer: Self-pay | Admitting: *Deleted

## 2023-05-10 DIAGNOSIS — F331 Major depressive disorder, recurrent, moderate: Secondary | ICD-10-CM | POA: Diagnosis not present

## 2023-05-10 DIAGNOSIS — G10 Huntington's disease: Secondary | ICD-10-CM | POA: Diagnosis not present

## 2023-05-10 DIAGNOSIS — F0280 Dementia in other diseases classified elsewhere without behavioral disturbance: Secondary | ICD-10-CM | POA: Diagnosis not present

## 2023-05-10 DIAGNOSIS — F411 Generalized anxiety disorder: Secondary | ICD-10-CM | POA: Diagnosis not present

## 2023-05-10 NOTE — Patient Outreach (Signed)
Post-Acute Care Manager follow up. Natasha Frederick previously utilized the [facility] SNF waiver for admission to [facility].   Previously confirmed with Natasha Frederick social worker, Natasha Frederick transitioned to [facility] on 05/02/23.  No identifiable care management needs.   Natasha Noble, MSN, RN, BSN [place]  [facility], [facility] Communities RN Post- Acute Care Manager Direct Dial: [phone]

## 2023-05-14 DIAGNOSIS — E785 Hyperlipidemia, unspecified: Secondary | ICD-10-CM | POA: Diagnosis not present

## 2023-05-14 DIAGNOSIS — M25511 Pain in right shoulder: Secondary | ICD-10-CM | POA: Diagnosis not present

## 2023-05-14 DIAGNOSIS — N3281 Overactive bladder: Secondary | ICD-10-CM | POA: Diagnosis not present

## 2023-05-14 DIAGNOSIS — W19XXXD Unspecified fall, subsequent encounter: Secondary | ICD-10-CM | POA: Diagnosis not present

## 2023-05-14 DIAGNOSIS — G10 Huntington's disease: Secondary | ICD-10-CM | POA: Diagnosis not present

## 2023-05-14 DIAGNOSIS — M6281 Muscle weakness (generalized): Secondary | ICD-10-CM | POA: Diagnosis not present

## 2023-05-14 DIAGNOSIS — E119 Type 2 diabetes mellitus without complications: Secondary | ICD-10-CM | POA: Diagnosis not present

## 2023-05-14 DIAGNOSIS — E039 Hypothyroidism, unspecified: Secondary | ICD-10-CM | POA: Diagnosis not present

## 2023-05-14 DIAGNOSIS — E559 Vitamin D deficiency, unspecified: Secondary | ICD-10-CM | POA: Diagnosis not present

## 2023-05-14 DIAGNOSIS — I10 Essential (primary) hypertension: Secondary | ICD-10-CM | POA: Diagnosis not present

## 2023-05-15 DIAGNOSIS — R2681 Unsteadiness on feet: Secondary | ICD-10-CM | POA: Diagnosis not present

## 2023-05-15 DIAGNOSIS — M6281 Muscle weakness (generalized): Secondary | ICD-10-CM | POA: Diagnosis not present

## 2023-05-15 DIAGNOSIS — R279 Unspecified lack of coordination: Secondary | ICD-10-CM | POA: Diagnosis not present

## 2023-05-16 DIAGNOSIS — R2681 Unsteadiness on feet: Secondary | ICD-10-CM | POA: Diagnosis not present

## 2023-05-16 DIAGNOSIS — R279 Unspecified lack of coordination: Secondary | ICD-10-CM | POA: Diagnosis not present

## 2023-05-16 DIAGNOSIS — M6281 Muscle weakness (generalized): Secondary | ICD-10-CM | POA: Diagnosis not present

## 2023-05-17 DIAGNOSIS — M6281 Muscle weakness (generalized): Secondary | ICD-10-CM | POA: Diagnosis not present

## 2023-05-19 DIAGNOSIS — M6281 Muscle weakness (generalized): Secondary | ICD-10-CM | POA: Diagnosis not present

## 2023-05-20 DIAGNOSIS — R1312 Dysphagia, oropharyngeal phase: Secondary | ICD-10-CM | POA: Diagnosis not present

## 2023-05-20 DIAGNOSIS — R41841 Cognitive communication deficit: Secondary | ICD-10-CM | POA: Diagnosis not present

## 2023-05-20 DIAGNOSIS — R2681 Unsteadiness on feet: Secondary | ICD-10-CM | POA: Diagnosis not present

## 2023-05-20 DIAGNOSIS — M6281 Muscle weakness (generalized): Secondary | ICD-10-CM | POA: Diagnosis not present

## 2023-05-20 DIAGNOSIS — R279 Unspecified lack of coordination: Secondary | ICD-10-CM | POA: Diagnosis not present

## 2023-05-20 DIAGNOSIS — I69828 Other speech and language deficits following other cerebrovascular disease: Secondary | ICD-10-CM | POA: Diagnosis not present

## 2023-05-20 DIAGNOSIS — I69891 Dysphagia following other cerebrovascular disease: Secondary | ICD-10-CM | POA: Diagnosis not present

## 2023-05-22 DIAGNOSIS — R279 Unspecified lack of coordination: Secondary | ICD-10-CM | POA: Diagnosis not present

## 2023-05-22 DIAGNOSIS — I69828 Other speech and language deficits following other cerebrovascular disease: Secondary | ICD-10-CM | POA: Diagnosis not present

## 2023-05-22 DIAGNOSIS — R41841 Cognitive communication deficit: Secondary | ICD-10-CM | POA: Diagnosis not present

## 2023-05-22 DIAGNOSIS — M6281 Muscle weakness (generalized): Secondary | ICD-10-CM | POA: Diagnosis not present

## 2023-05-22 DIAGNOSIS — R1312 Dysphagia, oropharyngeal phase: Secondary | ICD-10-CM | POA: Diagnosis not present

## 2023-05-22 DIAGNOSIS — I69891 Dysphagia following other cerebrovascular disease: Secondary | ICD-10-CM | POA: Diagnosis not present

## 2023-05-22 DIAGNOSIS — R2681 Unsteadiness on feet: Secondary | ICD-10-CM | POA: Diagnosis not present

## 2023-05-24 DIAGNOSIS — R279 Unspecified lack of coordination: Secondary | ICD-10-CM | POA: Diagnosis not present

## 2023-05-24 DIAGNOSIS — I69828 Other speech and language deficits following other cerebrovascular disease: Secondary | ICD-10-CM | POA: Diagnosis not present

## 2023-05-24 DIAGNOSIS — I69891 Dysphagia following other cerebrovascular disease: Secondary | ICD-10-CM | POA: Diagnosis not present

## 2023-05-24 DIAGNOSIS — R1312 Dysphagia, oropharyngeal phase: Secondary | ICD-10-CM | POA: Diagnosis not present

## 2023-05-24 DIAGNOSIS — R41841 Cognitive communication deficit: Secondary | ICD-10-CM | POA: Diagnosis not present

## 2023-05-24 DIAGNOSIS — R2681 Unsteadiness on feet: Secondary | ICD-10-CM | POA: Diagnosis not present

## 2023-05-24 DIAGNOSIS — M6281 Muscle weakness (generalized): Secondary | ICD-10-CM | POA: Diagnosis not present

## 2023-05-27 DIAGNOSIS — M6281 Muscle weakness (generalized): Secondary | ICD-10-CM | POA: Diagnosis not present

## 2023-05-27 DIAGNOSIS — I69891 Dysphagia following other cerebrovascular disease: Secondary | ICD-10-CM | POA: Diagnosis not present

## 2023-05-27 DIAGNOSIS — R41841 Cognitive communication deficit: Secondary | ICD-10-CM | POA: Diagnosis not present

## 2023-05-27 DIAGNOSIS — I69828 Other speech and language deficits following other cerebrovascular disease: Secondary | ICD-10-CM | POA: Diagnosis not present

## 2023-05-27 DIAGNOSIS — R1312 Dysphagia, oropharyngeal phase: Secondary | ICD-10-CM | POA: Diagnosis not present

## 2023-05-28 DIAGNOSIS — R279 Unspecified lack of coordination: Secondary | ICD-10-CM | POA: Diagnosis not present

## 2023-05-28 DIAGNOSIS — R2681 Unsteadiness on feet: Secondary | ICD-10-CM | POA: Diagnosis not present

## 2023-05-28 DIAGNOSIS — M6281 Muscle weakness (generalized): Secondary | ICD-10-CM | POA: Diagnosis not present

## 2023-05-30 DIAGNOSIS — M6281 Muscle weakness (generalized): Secondary | ICD-10-CM | POA: Diagnosis not present

## 2023-05-31 DIAGNOSIS — R2681 Unsteadiness on feet: Secondary | ICD-10-CM | POA: Diagnosis not present

## 2023-05-31 DIAGNOSIS — M6281 Muscle weakness (generalized): Secondary | ICD-10-CM | POA: Diagnosis not present

## 2023-05-31 DIAGNOSIS — R279 Unspecified lack of coordination: Secondary | ICD-10-CM | POA: Diagnosis not present

## 2023-06-02 DIAGNOSIS — M6281 Muscle weakness (generalized): Secondary | ICD-10-CM | POA: Diagnosis not present

## 2023-06-03 DIAGNOSIS — M6281 Muscle weakness (generalized): Secondary | ICD-10-CM | POA: Diagnosis not present

## 2023-06-03 DIAGNOSIS — R2681 Unsteadiness on feet: Secondary | ICD-10-CM | POA: Diagnosis not present

## 2023-06-03 DIAGNOSIS — R279 Unspecified lack of coordination: Secondary | ICD-10-CM | POA: Diagnosis not present

## 2023-06-05 DIAGNOSIS — R279 Unspecified lack of coordination: Secondary | ICD-10-CM | POA: Diagnosis not present

## 2023-06-05 DIAGNOSIS — M6281 Muscle weakness (generalized): Secondary | ICD-10-CM | POA: Diagnosis not present

## 2023-06-05 DIAGNOSIS — R2681 Unsteadiness on feet: Secondary | ICD-10-CM | POA: Diagnosis not present

## 2023-06-06 ENCOUNTER — Telehealth: Payer: Self-pay | Admitting: Family Medicine

## 2023-06-06 DIAGNOSIS — K219 Gastro-esophageal reflux disease without esophagitis: Secondary | ICD-10-CM | POA: Diagnosis not present

## 2023-06-06 DIAGNOSIS — F329 Major depressive disorder, single episode, unspecified: Secondary | ICD-10-CM | POA: Diagnosis not present

## 2023-06-06 DIAGNOSIS — R1312 Dysphagia, oropharyngeal phase: Secondary | ICD-10-CM | POA: Diagnosis not present

## 2023-06-06 DIAGNOSIS — F339 Major depressive disorder, recurrent, unspecified: Secondary | ICD-10-CM | POA: Diagnosis not present

## 2023-06-06 DIAGNOSIS — K5900 Constipation, unspecified: Secondary | ICD-10-CM | POA: Diagnosis not present

## 2023-06-06 DIAGNOSIS — G10 Huntington's disease: Secondary | ICD-10-CM | POA: Diagnosis not present

## 2023-06-06 DIAGNOSIS — R1310 Dysphagia, unspecified: Secondary | ICD-10-CM | POA: Diagnosis not present

## 2023-06-06 DIAGNOSIS — I69828 Other speech and language deficits following other cerebrovascular disease: Secondary | ICD-10-CM | POA: Diagnosis not present

## 2023-06-06 DIAGNOSIS — R41841 Cognitive communication deficit: Secondary | ICD-10-CM | POA: Diagnosis not present

## 2023-06-06 DIAGNOSIS — E559 Vitamin D deficiency, unspecified: Secondary | ICD-10-CM | POA: Diagnosis not present

## 2023-06-06 DIAGNOSIS — I69891 Dysphagia following other cerebrovascular disease: Secondary | ICD-10-CM | POA: Diagnosis not present

## 2023-06-06 NOTE — Telephone Encounter (Signed)
 Copied from CRM [id number]. Topic: General - Other >> Jun 06, 2023  1:05 PM Fredrich Romans wrote: Reason for CRM: Pain center called to check on fax that was sent over for patient.I advised her that I do not see any documentation of the fax.She's going to refax to office and ask that it be completed as soon as possible and sent back.

## 2023-06-07 DIAGNOSIS — F411 Generalized anxiety disorder: Secondary | ICD-10-CM | POA: Diagnosis not present

## 2023-06-07 DIAGNOSIS — I69891 Dysphagia following other cerebrovascular disease: Secondary | ICD-10-CM | POA: Diagnosis not present

## 2023-06-07 DIAGNOSIS — G10 Huntington's disease: Secondary | ICD-10-CM | POA: Diagnosis not present

## 2023-06-07 DIAGNOSIS — I69828 Other speech and language deficits following other cerebrovascular disease: Secondary | ICD-10-CM | POA: Diagnosis not present

## 2023-06-07 DIAGNOSIS — R41841 Cognitive communication deficit: Secondary | ICD-10-CM | POA: Diagnosis not present

## 2023-06-07 DIAGNOSIS — F331 Major depressive disorder, recurrent, moderate: Secondary | ICD-10-CM | POA: Diagnosis not present

## 2023-06-07 DIAGNOSIS — F03C3 Unspecified dementia, severe, with mood disturbance: Secondary | ICD-10-CM | POA: Diagnosis not present

## 2023-06-07 DIAGNOSIS — M6281 Muscle weakness (generalized): Secondary | ICD-10-CM | POA: Diagnosis not present

## 2023-06-07 DIAGNOSIS — R1312 Dysphagia, oropharyngeal phase: Secondary | ICD-10-CM | POA: Diagnosis not present

## 2023-06-09 DIAGNOSIS — M6281 Muscle weakness (generalized): Secondary | ICD-10-CM | POA: Diagnosis not present

## 2023-06-10 ENCOUNTER — Telehealth: Payer: Self-pay | Admitting: Family Medicine

## 2023-06-10 DIAGNOSIS — R279 Unspecified lack of coordination: Secondary | ICD-10-CM | POA: Diagnosis not present

## 2023-06-10 DIAGNOSIS — M6281 Muscle weakness (generalized): Secondary | ICD-10-CM | POA: Diagnosis not present

## 2023-06-10 DIAGNOSIS — R2681 Unsteadiness on feet: Secondary | ICD-10-CM | POA: Diagnosis not present

## 2023-06-10 DIAGNOSIS — R41841 Cognitive communication deficit: Secondary | ICD-10-CM | POA: Diagnosis not present

## 2023-06-10 DIAGNOSIS — H16141 Punctate keratitis, right eye: Secondary | ICD-10-CM | POA: Diagnosis not present

## 2023-06-10 DIAGNOSIS — I69828 Other speech and language deficits following other cerebrovascular disease: Secondary | ICD-10-CM | POA: Diagnosis not present

## 2023-06-10 DIAGNOSIS — I69891 Dysphagia following other cerebrovascular disease: Secondary | ICD-10-CM | POA: Diagnosis not present

## 2023-06-10 DIAGNOSIS — R1312 Dysphagia, oropharyngeal phase: Secondary | ICD-10-CM | POA: Diagnosis not present

## 2023-06-10 NOTE — Telephone Encounter (Signed)
 ATC patient's husband to verify if forms were requested by patient per PCP request. I was unable to leave vm for patient's husband to return my call due to mailbox being full

## 2023-06-10 NOTE — Telephone Encounter (Signed)
 Copied from CRM [id number]. Topic: General - Other >> Jun 06, 2023  1:05 PM Fredrich Romans wrote: Reason for CRM: Pain center called to check on fax that was sent over for patient.I advised her that I do not see any documentation of the fax.She's going to refax to office and ask that it be completed as soon as possible and sent back.

## 2023-06-10 NOTE — Telephone Encounter (Deleted)
 Copied from CRM [id number]. Topic: General - Other >> Jun 06, 2023  1:05 PM Fredrich Romans wrote: Reason for CRM: Pain center called to check on fax that was sent over for patient.I advised her that I do not see any documentation of the fax.She's going to refax to office and ask that it be completed as soon as possible and sent back.

## 2023-06-13 DIAGNOSIS — R279 Unspecified lack of coordination: Secondary | ICD-10-CM | POA: Diagnosis not present

## 2023-06-13 DIAGNOSIS — M6281 Muscle weakness (generalized): Secondary | ICD-10-CM | POA: Diagnosis not present

## 2023-06-13 DIAGNOSIS — R2681 Unsteadiness on feet: Secondary | ICD-10-CM | POA: Diagnosis not present

## 2023-06-14 DIAGNOSIS — R41841 Cognitive communication deficit: Secondary | ICD-10-CM | POA: Diagnosis not present

## 2023-06-14 DIAGNOSIS — R1312 Dysphagia, oropharyngeal phase: Secondary | ICD-10-CM | POA: Diagnosis not present

## 2023-06-14 DIAGNOSIS — R279 Unspecified lack of coordination: Secondary | ICD-10-CM | POA: Diagnosis not present

## 2023-06-14 DIAGNOSIS — R2681 Unsteadiness on feet: Secondary | ICD-10-CM | POA: Diagnosis not present

## 2023-06-14 DIAGNOSIS — M6281 Muscle weakness (generalized): Secondary | ICD-10-CM | POA: Diagnosis not present

## 2023-06-14 DIAGNOSIS — I69891 Dysphagia following other cerebrovascular disease: Secondary | ICD-10-CM | POA: Diagnosis not present

## 2023-06-14 DIAGNOSIS — I69828 Other speech and language deficits following other cerebrovascular disease: Secondary | ICD-10-CM | POA: Diagnosis not present

## 2023-06-14 NOTE — Telephone Encounter (Signed)
 I spoke with the patinet's husband and he reported this was not requested by them and forms have been discarded

## 2023-06-17 DIAGNOSIS — M6281 Muscle weakness (generalized): Secondary | ICD-10-CM | POA: Diagnosis not present

## 2023-06-17 DIAGNOSIS — R2681 Unsteadiness on feet: Secondary | ICD-10-CM | POA: Diagnosis not present

## 2023-06-17 DIAGNOSIS — I69891 Dysphagia following other cerebrovascular disease: Secondary | ICD-10-CM | POA: Diagnosis not present

## 2023-06-17 DIAGNOSIS — R279 Unspecified lack of coordination: Secondary | ICD-10-CM | POA: Diagnosis not present

## 2023-06-17 DIAGNOSIS — R1312 Dysphagia, oropharyngeal phase: Secondary | ICD-10-CM | POA: Diagnosis not present

## 2023-06-17 DIAGNOSIS — I69828 Other speech and language deficits following other cerebrovascular disease: Secondary | ICD-10-CM | POA: Diagnosis not present

## 2023-06-17 DIAGNOSIS — R41841 Cognitive communication deficit: Secondary | ICD-10-CM | POA: Diagnosis not present

## 2023-06-19 DIAGNOSIS — M6281 Muscle weakness (generalized): Secondary | ICD-10-CM | POA: Diagnosis not present

## 2023-06-20 DIAGNOSIS — W010XXD Fall on same level from slipping, tripping and stumbling without subsequent striking against object, subsequent encounter: Secondary | ICD-10-CM | POA: Diagnosis not present

## 2023-06-20 DIAGNOSIS — G10 Huntington's disease: Secondary | ICD-10-CM | POA: Diagnosis not present

## 2023-06-21 DIAGNOSIS — R1312 Dysphagia, oropharyngeal phase: Secondary | ICD-10-CM | POA: Diagnosis not present

## 2023-06-21 DIAGNOSIS — R2681 Unsteadiness on feet: Secondary | ICD-10-CM | POA: Diagnosis not present

## 2023-06-21 DIAGNOSIS — R279 Unspecified lack of coordination: Secondary | ICD-10-CM | POA: Diagnosis not present

## 2023-06-21 DIAGNOSIS — R41841 Cognitive communication deficit: Secondary | ICD-10-CM | POA: Diagnosis not present

## 2023-06-21 DIAGNOSIS — I69828 Other speech and language deficits following other cerebrovascular disease: Secondary | ICD-10-CM | POA: Diagnosis not present

## 2023-06-21 DIAGNOSIS — M6281 Muscle weakness (generalized): Secondary | ICD-10-CM | POA: Diagnosis not present

## 2023-06-21 DIAGNOSIS — I69891 Dysphagia following other cerebrovascular disease: Secondary | ICD-10-CM | POA: Diagnosis not present

## 2023-06-24 DIAGNOSIS — R2681 Unsteadiness on feet: Secondary | ICD-10-CM | POA: Diagnosis not present

## 2023-06-24 DIAGNOSIS — M6281 Muscle weakness (generalized): Secondary | ICD-10-CM | POA: Diagnosis not present

## 2023-06-24 DIAGNOSIS — R279 Unspecified lack of coordination: Secondary | ICD-10-CM | POA: Diagnosis not present

## 2023-06-25 DIAGNOSIS — R279 Unspecified lack of coordination: Secondary | ICD-10-CM | POA: Diagnosis not present

## 2023-06-25 DIAGNOSIS — M6281 Muscle weakness (generalized): Secondary | ICD-10-CM | POA: Diagnosis not present

## 2023-06-25 DIAGNOSIS — R2681 Unsteadiness on feet: Secondary | ICD-10-CM | POA: Diagnosis not present

## 2023-06-26 DIAGNOSIS — R41841 Cognitive communication deficit: Secondary | ICD-10-CM | POA: Diagnosis not present

## 2023-06-26 DIAGNOSIS — I69828 Other speech and language deficits following other cerebrovascular disease: Secondary | ICD-10-CM | POA: Diagnosis not present

## 2023-06-26 DIAGNOSIS — I69891 Dysphagia following other cerebrovascular disease: Secondary | ICD-10-CM | POA: Diagnosis not present

## 2023-06-26 DIAGNOSIS — R1312 Dysphagia, oropharyngeal phase: Secondary | ICD-10-CM | POA: Diagnosis not present

## 2023-06-26 DIAGNOSIS — M6281 Muscle weakness (generalized): Secondary | ICD-10-CM | POA: Diagnosis not present

## 2023-06-27 DIAGNOSIS — R2681 Unsteadiness on feet: Secondary | ICD-10-CM | POA: Diagnosis not present

## 2023-06-27 DIAGNOSIS — R279 Unspecified lack of coordination: Secondary | ICD-10-CM | POA: Diagnosis not present

## 2023-06-27 DIAGNOSIS — M6281 Muscle weakness (generalized): Secondary | ICD-10-CM | POA: Diagnosis not present

## 2023-06-28 ENCOUNTER — Telehealth: Payer: Self-pay

## 2023-06-28 DIAGNOSIS — R279 Unspecified lack of coordination: Secondary | ICD-10-CM | POA: Diagnosis not present

## 2023-06-28 DIAGNOSIS — F03C18 Unspecified dementia, severe, with other behavioral disturbance: Secondary | ICD-10-CM | POA: Diagnosis not present

## 2023-06-28 DIAGNOSIS — R41841 Cognitive communication deficit: Secondary | ICD-10-CM | POA: Diagnosis not present

## 2023-06-28 DIAGNOSIS — M6281 Muscle weakness (generalized): Secondary | ICD-10-CM | POA: Diagnosis not present

## 2023-06-28 DIAGNOSIS — R1312 Dysphagia, oropharyngeal phase: Secondary | ICD-10-CM | POA: Diagnosis not present

## 2023-06-28 DIAGNOSIS — R2681 Unsteadiness on feet: Secondary | ICD-10-CM | POA: Diagnosis not present

## 2023-06-28 DIAGNOSIS — I69891 Dysphagia following other cerebrovascular disease: Secondary | ICD-10-CM | POA: Diagnosis not present

## 2023-06-28 DIAGNOSIS — I69828 Other speech and language deficits following other cerebrovascular disease: Secondary | ICD-10-CM | POA: Diagnosis not present

## 2023-06-28 NOTE — Telephone Encounter (Signed)
 Please see note from 06/10/2023

## 2023-06-28 NOTE — Telephone Encounter (Signed)
 Copied from CRM [id number]. Topic: General - Other >> Jun 28, 2023  2:47 PM Fredrich Romans wrote: Reason for CRM: [facility] pain relief center called in to see if office has received a fax that was sent to the office. He stated that it has been faxed over several times,and no response or returned back. Cb#:[phone]

## 2023-06-29 DIAGNOSIS — M6281 Muscle weakness (generalized): Secondary | ICD-10-CM | POA: Diagnosis not present

## 2023-07-04 DIAGNOSIS — R2681 Unsteadiness on feet: Secondary | ICD-10-CM | POA: Diagnosis not present

## 2023-07-04 DIAGNOSIS — G10 Huntington's disease: Secondary | ICD-10-CM | POA: Diagnosis not present

## 2023-07-04 DIAGNOSIS — F411 Generalized anxiety disorder: Secondary | ICD-10-CM | POA: Diagnosis not present

## 2023-07-04 DIAGNOSIS — R279 Unspecified lack of coordination: Secondary | ICD-10-CM | POA: Diagnosis not present

## 2023-07-04 DIAGNOSIS — M6281 Muscle weakness (generalized): Secondary | ICD-10-CM | POA: Diagnosis not present

## 2023-07-04 DIAGNOSIS — E876 Hypokalemia: Secondary | ICD-10-CM | POA: Diagnosis not present

## 2023-07-05 ENCOUNTER — Telehealth: Payer: Self-pay | Admitting: Family Medicine

## 2023-07-05 DIAGNOSIS — F331 Major depressive disorder, recurrent, moderate: Secondary | ICD-10-CM | POA: Diagnosis not present

## 2023-07-05 DIAGNOSIS — R2681 Unsteadiness on feet: Secondary | ICD-10-CM | POA: Diagnosis not present

## 2023-07-05 DIAGNOSIS — R41841 Cognitive communication deficit: Secondary | ICD-10-CM | POA: Diagnosis not present

## 2023-07-05 DIAGNOSIS — F411 Generalized anxiety disorder: Secondary | ICD-10-CM | POA: Diagnosis not present

## 2023-07-05 DIAGNOSIS — G10 Huntington's disease: Secondary | ICD-10-CM | POA: Diagnosis not present

## 2023-07-05 DIAGNOSIS — F02818 Dementia in other diseases classified elsewhere, unspecified severity, with other behavioral disturbance: Secondary | ICD-10-CM | POA: Diagnosis not present

## 2023-07-05 DIAGNOSIS — R1312 Dysphagia, oropharyngeal phase: Secondary | ICD-10-CM | POA: Diagnosis not present

## 2023-07-05 DIAGNOSIS — I69891 Dysphagia following other cerebrovascular disease: Secondary | ICD-10-CM | POA: Diagnosis not present

## 2023-07-05 DIAGNOSIS — I69828 Other speech and language deficits following other cerebrovascular disease: Secondary | ICD-10-CM | POA: Diagnosis not present

## 2023-07-05 DIAGNOSIS — M6281 Muscle weakness (generalized): Secondary | ICD-10-CM | POA: Diagnosis not present

## 2023-07-05 DIAGNOSIS — R279 Unspecified lack of coordination: Secondary | ICD-10-CM | POA: Diagnosis not present

## 2023-07-05 NOTE — Telephone Encounter (Signed)
 Please see encounter from 06/10/2023

## 2023-07-05 NOTE — Telephone Encounter (Signed)
 Copied from CRM [id number]. Topic: General - Other >> Jul 05, 2023  2:48 PM Saverio Danker wrote: Reason for CRM:  Ortho pain relief center called in to see if office has received a fax that was sent to the office. He stated that it has been faxed over several times,and no response or returned back. Cb#:[phone]

## 2023-07-07 DIAGNOSIS — M6281 Muscle weakness (generalized): Secondary | ICD-10-CM | POA: Diagnosis not present

## 2023-07-08 DIAGNOSIS — R279 Unspecified lack of coordination: Secondary | ICD-10-CM | POA: Diagnosis not present

## 2023-07-08 DIAGNOSIS — M6281 Muscle weakness (generalized): Secondary | ICD-10-CM | POA: Diagnosis not present

## 2023-07-08 DIAGNOSIS — R2681 Unsteadiness on feet: Secondary | ICD-10-CM | POA: Diagnosis not present

## 2023-07-09 DIAGNOSIS — M6281 Muscle weakness (generalized): Secondary | ICD-10-CM | POA: Diagnosis not present

## 2023-07-10 DIAGNOSIS — I69828 Other speech and language deficits following other cerebrovascular disease: Secondary | ICD-10-CM | POA: Diagnosis not present

## 2023-07-10 DIAGNOSIS — I69891 Dysphagia following other cerebrovascular disease: Secondary | ICD-10-CM | POA: Diagnosis not present

## 2023-07-10 DIAGNOSIS — R279 Unspecified lack of coordination: Secondary | ICD-10-CM | POA: Diagnosis not present

## 2023-07-10 DIAGNOSIS — R1312 Dysphagia, oropharyngeal phase: Secondary | ICD-10-CM | POA: Diagnosis not present

## 2023-07-10 DIAGNOSIS — R41841 Cognitive communication deficit: Secondary | ICD-10-CM | POA: Diagnosis not present

## 2023-07-10 DIAGNOSIS — R2681 Unsteadiness on feet: Secondary | ICD-10-CM | POA: Diagnosis not present

## 2023-07-10 DIAGNOSIS — M6281 Muscle weakness (generalized): Secondary | ICD-10-CM | POA: Diagnosis not present

## 2023-07-11 DIAGNOSIS — E876 Hypokalemia: Secondary | ICD-10-CM | POA: Diagnosis not present

## 2023-07-12 DIAGNOSIS — F331 Major depressive disorder, recurrent, moderate: Secondary | ICD-10-CM | POA: Diagnosis not present

## 2023-07-12 DIAGNOSIS — F02818 Dementia in other diseases classified elsewhere, unspecified severity, with other behavioral disturbance: Secondary | ICD-10-CM | POA: Diagnosis not present

## 2023-07-12 DIAGNOSIS — G10 Huntington's disease: Secondary | ICD-10-CM | POA: Diagnosis not present

## 2023-07-12 DIAGNOSIS — F411 Generalized anxiety disorder: Secondary | ICD-10-CM | POA: Diagnosis not present

## 2023-07-12 DIAGNOSIS — M6281 Muscle weakness (generalized): Secondary | ICD-10-CM | POA: Diagnosis not present

## 2023-07-13 DIAGNOSIS — M6281 Muscle weakness (generalized): Secondary | ICD-10-CM | POA: Diagnosis not present

## 2023-07-13 DIAGNOSIS — R279 Unspecified lack of coordination: Secondary | ICD-10-CM | POA: Diagnosis not present

## 2023-07-13 DIAGNOSIS — R2681 Unsteadiness on feet: Secondary | ICD-10-CM | POA: Diagnosis not present

## 2023-07-15 DIAGNOSIS — M6281 Muscle weakness (generalized): Secondary | ICD-10-CM | POA: Diagnosis not present

## 2023-07-15 DIAGNOSIS — R41841 Cognitive communication deficit: Secondary | ICD-10-CM | POA: Diagnosis not present

## 2023-07-15 DIAGNOSIS — I69828 Other speech and language deficits following other cerebrovascular disease: Secondary | ICD-10-CM | POA: Diagnosis not present

## 2023-07-15 DIAGNOSIS — R1312 Dysphagia, oropharyngeal phase: Secondary | ICD-10-CM | POA: Diagnosis not present

## 2023-07-15 DIAGNOSIS — R279 Unspecified lack of coordination: Secondary | ICD-10-CM | POA: Diagnosis not present

## 2023-07-15 DIAGNOSIS — I69891 Dysphagia following other cerebrovascular disease: Secondary | ICD-10-CM | POA: Diagnosis not present

## 2023-07-15 DIAGNOSIS — R2681 Unsteadiness on feet: Secondary | ICD-10-CM | POA: Diagnosis not present

## 2023-07-17 DIAGNOSIS — R2681 Unsteadiness on feet: Secondary | ICD-10-CM | POA: Diagnosis not present

## 2023-07-17 DIAGNOSIS — R41841 Cognitive communication deficit: Secondary | ICD-10-CM | POA: Diagnosis not present

## 2023-07-17 DIAGNOSIS — I69828 Other speech and language deficits following other cerebrovascular disease: Secondary | ICD-10-CM | POA: Diagnosis not present

## 2023-07-17 DIAGNOSIS — M6281 Muscle weakness (generalized): Secondary | ICD-10-CM | POA: Diagnosis not present

## 2023-07-17 DIAGNOSIS — I69891 Dysphagia following other cerebrovascular disease: Secondary | ICD-10-CM | POA: Diagnosis not present

## 2023-07-17 DIAGNOSIS — R279 Unspecified lack of coordination: Secondary | ICD-10-CM | POA: Diagnosis not present

## 2023-07-17 DIAGNOSIS — R1312 Dysphagia, oropharyngeal phase: Secondary | ICD-10-CM | POA: Diagnosis not present

## 2023-07-18 DIAGNOSIS — R2681 Unsteadiness on feet: Secondary | ICD-10-CM | POA: Diagnosis not present

## 2023-07-18 DIAGNOSIS — M6281 Muscle weakness (generalized): Secondary | ICD-10-CM | POA: Diagnosis not present

## 2023-07-18 DIAGNOSIS — R279 Unspecified lack of coordination: Secondary | ICD-10-CM | POA: Diagnosis not present

## 2023-07-19 DIAGNOSIS — M6281 Muscle weakness (generalized): Secondary | ICD-10-CM | POA: Diagnosis not present

## 2023-07-22 DIAGNOSIS — M6281 Muscle weakness (generalized): Secondary | ICD-10-CM | POA: Diagnosis not present

## 2023-07-24 DIAGNOSIS — M6281 Muscle weakness (generalized): Secondary | ICD-10-CM | POA: Diagnosis not present

## 2023-07-26 DIAGNOSIS — M6281 Muscle weakness (generalized): Secondary | ICD-10-CM | POA: Diagnosis not present

## 2023-07-26 DIAGNOSIS — R279 Unspecified lack of coordination: Secondary | ICD-10-CM | POA: Diagnosis not present

## 2023-07-26 DIAGNOSIS — R2681 Unsteadiness on feet: Secondary | ICD-10-CM | POA: Diagnosis not present

## 2023-07-29 ENCOUNTER — Telehealth: Payer: Self-pay | Admitting: Family Medicine

## 2023-07-29 DIAGNOSIS — R279 Unspecified lack of coordination: Secondary | ICD-10-CM | POA: Diagnosis not present

## 2023-07-29 DIAGNOSIS — R2681 Unsteadiness on feet: Secondary | ICD-10-CM | POA: Diagnosis not present

## 2023-07-29 DIAGNOSIS — M6281 Muscle weakness (generalized): Secondary | ICD-10-CM | POA: Diagnosis not present

## 2023-07-29 NOTE — Telephone Encounter (Signed)
 Copied from CRM [id number]. Topic: Medical Record Request - Provider/Facility Request >> Jul 26, 2023  1:25 PM [place] J wrote: Reason for CRM: Representative from [facility] is calling in to see if a fax they sent over for a prior authorization was received. I let him know that I did not see any new documents in the patient chart and the office will most likely get to it on Monday due to being out of office today.

## 2023-07-29 NOTE — Telephone Encounter (Signed)
 Please see encounter from 06/10/2022 forms were not requested by patient's husband.

## 2023-07-30 DIAGNOSIS — R2681 Unsteadiness on feet: Secondary | ICD-10-CM | POA: Diagnosis not present

## 2023-07-30 DIAGNOSIS — M6281 Muscle weakness (generalized): Secondary | ICD-10-CM | POA: Diagnosis not present

## 2023-07-30 DIAGNOSIS — R279 Unspecified lack of coordination: Secondary | ICD-10-CM | POA: Diagnosis not present

## 2023-08-01 DIAGNOSIS — M6281 Muscle weakness (generalized): Secondary | ICD-10-CM | POA: Diagnosis not present

## 2023-08-01 DIAGNOSIS — R2681 Unsteadiness on feet: Secondary | ICD-10-CM | POA: Diagnosis not present

## 2023-08-01 DIAGNOSIS — R279 Unspecified lack of coordination: Secondary | ICD-10-CM | POA: Diagnosis not present

## 2023-08-02 DIAGNOSIS — R2681 Unsteadiness on feet: Secondary | ICD-10-CM | POA: Diagnosis not present

## 2023-08-02 DIAGNOSIS — M6281 Muscle weakness (generalized): Secondary | ICD-10-CM | POA: Diagnosis not present

## 2023-08-02 DIAGNOSIS — F411 Generalized anxiety disorder: Secondary | ICD-10-CM | POA: Diagnosis not present

## 2023-08-02 DIAGNOSIS — F02818 Dementia in other diseases classified elsewhere, unspecified severity, with other behavioral disturbance: Secondary | ICD-10-CM | POA: Diagnosis not present

## 2023-08-02 DIAGNOSIS — R279 Unspecified lack of coordination: Secondary | ICD-10-CM | POA: Diagnosis not present

## 2023-08-02 DIAGNOSIS — F331 Major depressive disorder, recurrent, moderate: Secondary | ICD-10-CM | POA: Diagnosis not present

## 2023-08-02 DIAGNOSIS — G10 Huntington's disease: Secondary | ICD-10-CM | POA: Diagnosis not present

## 2023-08-05 DIAGNOSIS — M6281 Muscle weakness (generalized): Secondary | ICD-10-CM | POA: Diagnosis not present

## 2023-08-06 DIAGNOSIS — R2681 Unsteadiness on feet: Secondary | ICD-10-CM | POA: Diagnosis not present

## 2023-08-06 DIAGNOSIS — G10 Huntington's disease: Secondary | ICD-10-CM | POA: Diagnosis not present

## 2023-08-06 DIAGNOSIS — F02818 Dementia in other diseases classified elsewhere, unspecified severity, with other behavioral disturbance: Secondary | ICD-10-CM | POA: Diagnosis not present

## 2023-08-06 DIAGNOSIS — M6281 Muscle weakness (generalized): Secondary | ICD-10-CM | POA: Diagnosis not present

## 2023-08-06 DIAGNOSIS — R279 Unspecified lack of coordination: Secondary | ICD-10-CM | POA: Diagnosis not present

## 2023-08-06 DIAGNOSIS — E876 Hypokalemia: Secondary | ICD-10-CM | POA: Diagnosis not present

## 2023-08-06 DIAGNOSIS — F411 Generalized anxiety disorder: Secondary | ICD-10-CM | POA: Diagnosis not present

## 2023-08-06 DIAGNOSIS — K5900 Constipation, unspecified: Secondary | ICD-10-CM | POA: Diagnosis not present

## 2023-08-07 DIAGNOSIS — R2681 Unsteadiness on feet: Secondary | ICD-10-CM | POA: Diagnosis not present

## 2023-08-07 DIAGNOSIS — F411 Generalized anxiety disorder: Secondary | ICD-10-CM | POA: Diagnosis not present

## 2023-08-07 DIAGNOSIS — R279 Unspecified lack of coordination: Secondary | ICD-10-CM | POA: Diagnosis not present

## 2023-08-07 DIAGNOSIS — G10 Huntington's disease: Secondary | ICD-10-CM | POA: Diagnosis not present

## 2023-08-07 DIAGNOSIS — M6281 Muscle weakness (generalized): Secondary | ICD-10-CM | POA: Diagnosis not present

## 2023-08-15 DIAGNOSIS — F411 Generalized anxiety disorder: Secondary | ICD-10-CM | POA: Diagnosis not present

## 2023-08-15 DIAGNOSIS — F329 Major depressive disorder, single episode, unspecified: Secondary | ICD-10-CM | POA: Diagnosis not present

## 2023-08-30 DIAGNOSIS — G10 Huntington's disease: Secondary | ICD-10-CM | POA: Diagnosis not present

## 2023-08-30 DIAGNOSIS — F03B0 Unspecified dementia, moderate, without behavioral disturbance, psychotic disturbance, mood disturbance, and anxiety: Secondary | ICD-10-CM | POA: Diagnosis not present

## 2023-08-30 DIAGNOSIS — F331 Major depressive disorder, recurrent, moderate: Secondary | ICD-10-CM | POA: Diagnosis not present

## 2023-08-30 DIAGNOSIS — F411 Generalized anxiety disorder: Secondary | ICD-10-CM | POA: Diagnosis not present

## 2023-09-01 DIAGNOSIS — G10 Huntington's disease: Secondary | ICD-10-CM | POA: Diagnosis not present

## 2023-09-01 DIAGNOSIS — F411 Generalized anxiety disorder: Secondary | ICD-10-CM | POA: Diagnosis not present

## 2023-09-03 DIAGNOSIS — K219 Gastro-esophageal reflux disease without esophagitis: Secondary | ICD-10-CM | POA: Diagnosis not present

## 2023-09-03 DIAGNOSIS — F411 Generalized anxiety disorder: Secondary | ICD-10-CM | POA: Diagnosis not present

## 2023-09-03 DIAGNOSIS — E559 Vitamin D deficiency, unspecified: Secondary | ICD-10-CM | POA: Diagnosis not present

## 2023-09-03 DIAGNOSIS — H3093 Unspecified chorioretinal inflammation, bilateral: Secondary | ICD-10-CM | POA: Diagnosis not present

## 2023-09-12 DIAGNOSIS — E559 Vitamin D deficiency, unspecified: Secondary | ICD-10-CM | POA: Diagnosis not present

## 2023-09-12 DIAGNOSIS — G10 Huntington's disease: Secondary | ICD-10-CM | POA: Diagnosis not present

## 2023-09-12 DIAGNOSIS — K219 Gastro-esophageal reflux disease without esophagitis: Secondary | ICD-10-CM | POA: Diagnosis not present

## 2023-09-12 DIAGNOSIS — F411 Generalized anxiety disorder: Secondary | ICD-10-CM | POA: Diagnosis not present

## 2023-09-12 DIAGNOSIS — Z0001 Encounter for general adult medical examination with abnormal findings: Secondary | ICD-10-CM | POA: Diagnosis not present

## 2023-09-24 DIAGNOSIS — R633 Feeding difficulties, unspecified: Secondary | ICD-10-CM | POA: Diagnosis not present

## 2023-09-24 DIAGNOSIS — K117 Disturbances of salivary secretion: Secondary | ICD-10-CM | POA: Diagnosis not present

## 2023-09-24 DIAGNOSIS — R252 Cramp and spasm: Secondary | ICD-10-CM | POA: Diagnosis not present

## 2023-09-24 DIAGNOSIS — G10 Huntington's disease: Secondary | ICD-10-CM | POA: Diagnosis not present

## 2023-09-26 DIAGNOSIS — F0390 Unspecified dementia without behavioral disturbance: Secondary | ICD-10-CM | POA: Diagnosis not present

## 2023-09-26 DIAGNOSIS — F411 Generalized anxiety disorder: Secondary | ICD-10-CM | POA: Diagnosis not present

## 2023-10-01 DIAGNOSIS — R633 Feeding difficulties, unspecified: Secondary | ICD-10-CM | POA: Diagnosis not present

## 2023-10-01 DIAGNOSIS — Z79899 Other long term (current) drug therapy: Secondary | ICD-10-CM | POA: Diagnosis not present

## 2023-10-01 DIAGNOSIS — G10 Huntington's disease: Secondary | ICD-10-CM | POA: Diagnosis not present

## 2023-10-01 DIAGNOSIS — F331 Major depressive disorder, recurrent, moderate: Secondary | ICD-10-CM | POA: Diagnosis not present

## 2023-10-01 DIAGNOSIS — K219 Gastro-esophageal reflux disease without esophagitis: Secondary | ICD-10-CM | POA: Diagnosis not present

## 2023-10-04 DIAGNOSIS — F0390 Unspecified dementia without behavioral disturbance: Secondary | ICD-10-CM | POA: Diagnosis not present

## 2023-10-04 DIAGNOSIS — G10 Huntington's disease: Secondary | ICD-10-CM | POA: Diagnosis not present

## 2023-10-04 DIAGNOSIS — F411 Generalized anxiety disorder: Secondary | ICD-10-CM | POA: Diagnosis not present

## 2023-10-04 DIAGNOSIS — F331 Major depressive disorder, recurrent, moderate: Secondary | ICD-10-CM | POA: Diagnosis not present

## 2023-10-07 DIAGNOSIS — F411 Generalized anxiety disorder: Secondary | ICD-10-CM | POA: Diagnosis not present

## 2023-10-07 DIAGNOSIS — G10 Huntington's disease: Secondary | ICD-10-CM | POA: Diagnosis not present

## 2023-10-15 DIAGNOSIS — M24541 Contracture, right hand: Secondary | ICD-10-CM | POA: Diagnosis not present

## 2023-10-15 DIAGNOSIS — R293 Abnormal posture: Secondary | ICD-10-CM | POA: Diagnosis not present

## 2023-10-15 DIAGNOSIS — R278 Other lack of coordination: Secondary | ICD-10-CM | POA: Diagnosis not present

## 2023-10-16 DIAGNOSIS — M24541 Contracture, right hand: Secondary | ICD-10-CM | POA: Diagnosis not present

## 2023-10-16 DIAGNOSIS — R278 Other lack of coordination: Secondary | ICD-10-CM | POA: Diagnosis not present

## 2023-10-16 DIAGNOSIS — R293 Abnormal posture: Secondary | ICD-10-CM | POA: Diagnosis not present

## 2023-10-17 DIAGNOSIS — F411 Generalized anxiety disorder: Secondary | ICD-10-CM | POA: Diagnosis not present

## 2023-10-17 DIAGNOSIS — F0390 Unspecified dementia without behavioral disturbance: Secondary | ICD-10-CM | POA: Diagnosis not present

## 2023-10-18 DIAGNOSIS — R278 Other lack of coordination: Secondary | ICD-10-CM | POA: Diagnosis not present

## 2023-10-18 DIAGNOSIS — R293 Abnormal posture: Secondary | ICD-10-CM | POA: Diagnosis not present

## 2023-10-18 DIAGNOSIS — M24541 Contracture, right hand: Secondary | ICD-10-CM | POA: Diagnosis not present

## 2023-10-22 DIAGNOSIS — R1312 Dysphagia, oropharyngeal phase: Secondary | ICD-10-CM | POA: Diagnosis not present

## 2023-10-22 DIAGNOSIS — M24541 Contracture, right hand: Secondary | ICD-10-CM | POA: Diagnosis not present

## 2023-10-22 DIAGNOSIS — R278 Other lack of coordination: Secondary | ICD-10-CM | POA: Diagnosis not present

## 2023-10-22 DIAGNOSIS — R293 Abnormal posture: Secondary | ICD-10-CM | POA: Diagnosis not present

## 2023-10-23 DIAGNOSIS — K219 Gastro-esophageal reflux disease without esophagitis: Secondary | ICD-10-CM | POA: Diagnosis not present

## 2023-10-23 DIAGNOSIS — G10 Huntington's disease: Secondary | ICD-10-CM | POA: Diagnosis not present

## 2023-10-23 DIAGNOSIS — R293 Abnormal posture: Secondary | ICD-10-CM | POA: Diagnosis not present

## 2023-10-23 DIAGNOSIS — M24541 Contracture, right hand: Secondary | ICD-10-CM | POA: Diagnosis not present

## 2023-10-23 DIAGNOSIS — R278 Other lack of coordination: Secondary | ICD-10-CM | POA: Diagnosis not present

## 2023-10-23 DIAGNOSIS — F3340 Major depressive disorder, recurrent, in remission, unspecified: Secondary | ICD-10-CM | POA: Diagnosis not present

## 2023-10-24 DIAGNOSIS — R293 Abnormal posture: Secondary | ICD-10-CM | POA: Diagnosis not present

## 2023-10-24 DIAGNOSIS — M24541 Contracture, right hand: Secondary | ICD-10-CM | POA: Diagnosis not present

## 2023-10-24 DIAGNOSIS — R278 Other lack of coordination: Secondary | ICD-10-CM | POA: Diagnosis not present

## 2023-10-24 DIAGNOSIS — R1312 Dysphagia, oropharyngeal phase: Secondary | ICD-10-CM | POA: Diagnosis not present

## 2023-10-28 DIAGNOSIS — R1312 Dysphagia, oropharyngeal phase: Secondary | ICD-10-CM | POA: Diagnosis not present

## 2023-10-29 DIAGNOSIS — R1312 Dysphagia, oropharyngeal phase: Secondary | ICD-10-CM | POA: Diagnosis not present

## 2023-10-29 DIAGNOSIS — Z993 Dependence on wheelchair: Secondary | ICD-10-CM | POA: Diagnosis not present

## 2023-10-29 DIAGNOSIS — K5900 Constipation, unspecified: Secondary | ICD-10-CM | POA: Diagnosis not present

## 2023-10-29 DIAGNOSIS — Z89511 Acquired absence of right leg below knee: Secondary | ICD-10-CM | POA: Diagnosis not present

## 2023-10-29 DIAGNOSIS — M24541 Contracture, right hand: Secondary | ICD-10-CM | POA: Diagnosis not present

## 2023-10-29 DIAGNOSIS — R633 Feeding difficulties, unspecified: Secondary | ICD-10-CM | POA: Diagnosis not present

## 2023-10-29 DIAGNOSIS — G10 Huntington's disease: Secondary | ICD-10-CM | POA: Diagnosis not present

## 2023-10-29 DIAGNOSIS — R278 Other lack of coordination: Secondary | ICD-10-CM | POA: Diagnosis not present

## 2023-10-29 DIAGNOSIS — E559 Vitamin D deficiency, unspecified: Secondary | ICD-10-CM | POA: Diagnosis not present

## 2023-10-29 DIAGNOSIS — R293 Abnormal posture: Secondary | ICD-10-CM | POA: Diagnosis not present

## 2023-10-30 ENCOUNTER — Telehealth: Payer: Self-pay

## 2023-10-30 DIAGNOSIS — M24541 Contracture, right hand: Secondary | ICD-10-CM | POA: Diagnosis not present

## 2023-10-30 DIAGNOSIS — R278 Other lack of coordination: Secondary | ICD-10-CM | POA: Diagnosis not present

## 2023-10-30 DIAGNOSIS — R293 Abnormal posture: Secondary | ICD-10-CM | POA: Diagnosis not present

## 2023-10-30 NOTE — Telephone Encounter (Signed)
 Copied from CRM [id number]. Topic: General - Other >> Oct 30, 2023  4:12 PM Burnard DEL wrote: Reason for CRM: Patients husband is requesting a phone call from provider .Stated that she wanted to discuss his wifes competency.

## 2023-10-31 DIAGNOSIS — F3340 Major depressive disorder, recurrent, in remission, unspecified: Secondary | ICD-10-CM | POA: Diagnosis not present

## 2023-10-31 DIAGNOSIS — F411 Generalized anxiety disorder: Secondary | ICD-10-CM | POA: Diagnosis not present

## 2023-10-31 NOTE — Telephone Encounter (Signed)
 I spoke with the patient's husband and he declined office visit. Patient has been scheduled for teleheatlh visit tomorrow

## 2023-11-01 ENCOUNTER — Telehealth: Admitting: Family Medicine

## 2023-11-01 DIAGNOSIS — G10 Huntington's disease: Secondary | ICD-10-CM | POA: Diagnosis not present

## 2023-11-01 DIAGNOSIS — F331 Major depressive disorder, recurrent, moderate: Secondary | ICD-10-CM | POA: Diagnosis not present

## 2023-11-01 DIAGNOSIS — F411 Generalized anxiety disorder: Secondary | ICD-10-CM | POA: Diagnosis not present

## 2023-11-01 DIAGNOSIS — R1312 Dysphagia, oropharyngeal phase: Secondary | ICD-10-CM | POA: Diagnosis not present

## 2023-11-01 DIAGNOSIS — F0390 Unspecified dementia without behavioral disturbance: Secondary | ICD-10-CM | POA: Diagnosis not present

## 2023-11-05 DIAGNOSIS — M24541 Contracture, right hand: Secondary | ICD-10-CM | POA: Diagnosis not present

## 2023-11-05 DIAGNOSIS — R278 Other lack of coordination: Secondary | ICD-10-CM | POA: Diagnosis not present

## 2023-11-05 DIAGNOSIS — R293 Abnormal posture: Secondary | ICD-10-CM | POA: Diagnosis not present

## 2023-11-05 DIAGNOSIS — R1312 Dysphagia, oropharyngeal phase: Secondary | ICD-10-CM | POA: Diagnosis not present

## 2023-11-06 DIAGNOSIS — R1312 Dysphagia, oropharyngeal phase: Secondary | ICD-10-CM | POA: Diagnosis not present

## 2023-11-07 DIAGNOSIS — R1312 Dysphagia, oropharyngeal phase: Secondary | ICD-10-CM | POA: Diagnosis not present

## 2023-11-08 DIAGNOSIS — R278 Other lack of coordination: Secondary | ICD-10-CM | POA: Diagnosis not present

## 2023-11-08 DIAGNOSIS — G10 Huntington's disease: Secondary | ICD-10-CM | POA: Diagnosis not present

## 2023-11-08 DIAGNOSIS — R262 Difficulty in walking, not elsewhere classified: Secondary | ICD-10-CM | POA: Diagnosis not present

## 2023-11-08 DIAGNOSIS — R293 Abnormal posture: Secondary | ICD-10-CM | POA: Diagnosis not present

## 2023-11-08 DIAGNOSIS — M24541 Contracture, right hand: Secondary | ICD-10-CM | POA: Diagnosis not present

## 2023-11-11 DIAGNOSIS — G10 Huntington's disease: Secondary | ICD-10-CM | POA: Diagnosis not present

## 2023-11-11 DIAGNOSIS — R278 Other lack of coordination: Secondary | ICD-10-CM | POA: Diagnosis not present

## 2023-11-11 DIAGNOSIS — R262 Difficulty in walking, not elsewhere classified: Secondary | ICD-10-CM | POA: Diagnosis not present

## 2023-11-11 DIAGNOSIS — R1312 Dysphagia, oropharyngeal phase: Secondary | ICD-10-CM | POA: Diagnosis not present

## 2023-11-12 DIAGNOSIS — R1312 Dysphagia, oropharyngeal phase: Secondary | ICD-10-CM | POA: Diagnosis not present

## 2023-11-16 DIAGNOSIS — R278 Other lack of coordination: Secondary | ICD-10-CM | POA: Diagnosis not present

## 2023-11-16 DIAGNOSIS — R262 Difficulty in walking, not elsewhere classified: Secondary | ICD-10-CM | POA: Diagnosis not present

## 2023-11-16 DIAGNOSIS — G10 Huntington's disease: Secondary | ICD-10-CM | POA: Diagnosis not present

## 2023-11-17 DIAGNOSIS — R1312 Dysphagia, oropharyngeal phase: Secondary | ICD-10-CM | POA: Diagnosis not present

## 2023-11-19 DIAGNOSIS — R1312 Dysphagia, oropharyngeal phase: Secondary | ICD-10-CM | POA: Diagnosis not present

## 2023-11-20 DIAGNOSIS — R262 Difficulty in walking, not elsewhere classified: Secondary | ICD-10-CM | POA: Diagnosis not present

## 2023-11-20 DIAGNOSIS — R278 Other lack of coordination: Secondary | ICD-10-CM | POA: Diagnosis not present

## 2023-11-20 DIAGNOSIS — G10 Huntington's disease: Secondary | ICD-10-CM | POA: Diagnosis not present

## 2023-11-26 DIAGNOSIS — N182 Chronic kidney disease, stage 2 (mild): Secondary | ICD-10-CM | POA: Diagnosis not present

## 2023-11-26 DIAGNOSIS — R1312 Dysphagia, oropharyngeal phase: Secondary | ICD-10-CM | POA: Diagnosis not present

## 2023-11-26 DIAGNOSIS — K219 Gastro-esophageal reflux disease without esophagitis: Secondary | ICD-10-CM | POA: Diagnosis not present

## 2023-11-26 DIAGNOSIS — F411 Generalized anxiety disorder: Secondary | ICD-10-CM | POA: Diagnosis not present

## 2023-11-27 DIAGNOSIS — R262 Difficulty in walking, not elsewhere classified: Secondary | ICD-10-CM | POA: Diagnosis not present

## 2023-11-27 DIAGNOSIS — R1312 Dysphagia, oropharyngeal phase: Secondary | ICD-10-CM | POA: Diagnosis not present

## 2023-11-27 DIAGNOSIS — G10 Huntington's disease: Secondary | ICD-10-CM | POA: Diagnosis not present

## 2023-11-27 DIAGNOSIS — R278 Other lack of coordination: Secondary | ICD-10-CM | POA: Diagnosis not present

## 2023-11-28 DIAGNOSIS — N182 Chronic kidney disease, stage 2 (mild): Secondary | ICD-10-CM | POA: Diagnosis not present

## 2023-11-28 DIAGNOSIS — F0390 Unspecified dementia without behavioral disturbance: Secondary | ICD-10-CM | POA: Diagnosis not present

## 2023-11-28 DIAGNOSIS — F411 Generalized anxiety disorder: Secondary | ICD-10-CM | POA: Diagnosis not present

## 2023-11-29 DIAGNOSIS — G10 Huntington's disease: Secondary | ICD-10-CM | POA: Diagnosis not present

## 2023-11-29 DIAGNOSIS — F411 Generalized anxiety disorder: Secondary | ICD-10-CM | POA: Diagnosis not present

## 2023-11-29 DIAGNOSIS — F331 Major depressive disorder, recurrent, moderate: Secondary | ICD-10-CM | POA: Diagnosis not present

## 2023-11-29 DIAGNOSIS — F0390 Unspecified dementia without behavioral disturbance: Secondary | ICD-10-CM | POA: Diagnosis not present

## 2023-12-03 DIAGNOSIS — R1312 Dysphagia, oropharyngeal phase: Secondary | ICD-10-CM | POA: Diagnosis not present

## 2023-12-04 DIAGNOSIS — R1312 Dysphagia, oropharyngeal phase: Secondary | ICD-10-CM | POA: Diagnosis not present

## 2023-12-06 DIAGNOSIS — R1312 Dysphagia, oropharyngeal phase: Secondary | ICD-10-CM | POA: Diagnosis not present

## 2023-12-16 ENCOUNTER — Emergency Department (HOSPITAL_COMMUNITY)

## 2023-12-16 ENCOUNTER — Inpatient Hospital Stay (HOSPITAL_COMMUNITY)
Admission: EM | Admit: 2023-12-16 | Discharge: 2023-12-20 | DRG: 987 | Disposition: A | Discharge status: Skilled Nursing Facility | Source: Skilled Nursing Facility | Attending: Internal Medicine | Admitting: Internal Medicine

## 2023-12-16 ENCOUNTER — Other Ambulatory Visit: Payer: Self-pay

## 2023-12-16 ENCOUNTER — Encounter (HOSPITAL_COMMUNITY): Payer: Self-pay

## 2023-12-16 DIAGNOSIS — Z515 Encounter for palliative care: Secondary | ICD-10-CM

## 2023-12-16 DIAGNOSIS — Z89511 Acquired absence of right leg below knee: Secondary | ICD-10-CM

## 2023-12-16 DIAGNOSIS — Z7189 Other specified counseling: Secondary | ICD-10-CM | POA: Diagnosis not present

## 2023-12-16 DIAGNOSIS — C257 Malignant neoplasm of other parts of pancreas: Principal | ICD-10-CM | POA: Diagnosis present

## 2023-12-16 DIAGNOSIS — Z96642 Presence of left artificial hip joint: Secondary | ICD-10-CM | POA: Diagnosis present

## 2023-12-16 DIAGNOSIS — K2980 Duodenitis without bleeding: Secondary | ICD-10-CM | POA: Diagnosis not present

## 2023-12-16 DIAGNOSIS — F0280 Dementia in other diseases classified elsewhere without behavioral disturbance: Secondary | ICD-10-CM | POA: Diagnosis present

## 2023-12-16 DIAGNOSIS — I10 Essential (primary) hypertension: Secondary | ICD-10-CM | POA: Diagnosis not present

## 2023-12-16 DIAGNOSIS — D75839 Thrombocytosis, unspecified: Secondary | ICD-10-CM | POA: Diagnosis present

## 2023-12-16 DIAGNOSIS — R748 Abnormal levels of other serum enzymes: Secondary | ICD-10-CM

## 2023-12-16 DIAGNOSIS — Z885 Allergy status to narcotic agent status: Secondary | ICD-10-CM

## 2023-12-16 DIAGNOSIS — R410 Disorientation, unspecified: Secondary | ICD-10-CM | POA: Diagnosis not present

## 2023-12-16 DIAGNOSIS — E876 Hypokalemia: Secondary | ICD-10-CM | POA: Diagnosis present

## 2023-12-16 DIAGNOSIS — Z87891 Personal history of nicotine dependence: Secondary | ICD-10-CM | POA: Diagnosis not present

## 2023-12-16 DIAGNOSIS — Z91030 Bee allergy status: Secondary | ICD-10-CM | POA: Diagnosis not present

## 2023-12-16 DIAGNOSIS — K831 Obstruction of bile duct: Secondary | ICD-10-CM | POA: Diagnosis present

## 2023-12-16 DIAGNOSIS — K7682 Hepatic encephalopathy: Secondary | ICD-10-CM | POA: Diagnosis not present

## 2023-12-16 DIAGNOSIS — K869 Disease of pancreas, unspecified: Secondary | ICD-10-CM | POA: Diagnosis not present

## 2023-12-16 DIAGNOSIS — K219 Gastro-esophageal reflux disease without esophagitis: Secondary | ICD-10-CM | POA: Diagnosis present

## 2023-12-16 DIAGNOSIS — R531 Weakness: Secondary | ICD-10-CM | POA: Diagnosis not present

## 2023-12-16 DIAGNOSIS — Z66 Do not resuscitate: Secondary | ICD-10-CM | POA: Diagnosis present

## 2023-12-16 DIAGNOSIS — Z993 Dependence on wheelchair: Secondary | ICD-10-CM | POA: Diagnosis not present

## 2023-12-16 DIAGNOSIS — M419 Scoliosis, unspecified: Secondary | ICD-10-CM | POA: Diagnosis present

## 2023-12-16 DIAGNOSIS — K8689 Other specified diseases of pancreas: Secondary | ICD-10-CM | POA: Diagnosis not present

## 2023-12-16 DIAGNOSIS — Z1152 Encounter for screening for COVID-19: Secondary | ICD-10-CM | POA: Diagnosis not present

## 2023-12-16 DIAGNOSIS — F32A Depression, unspecified: Secondary | ICD-10-CM | POA: Diagnosis present

## 2023-12-16 DIAGNOSIS — K269 Duodenal ulcer, unspecified as acute or chronic, without hemorrhage or perforation: Secondary | ICD-10-CM | POA: Diagnosis present

## 2023-12-16 DIAGNOSIS — G10 Huntington's disease: Secondary | ICD-10-CM | POA: Diagnosis present

## 2023-12-16 DIAGNOSIS — Z781 Physical restraint status: Secondary | ICD-10-CM | POA: Diagnosis not present

## 2023-12-16 DIAGNOSIS — L0291 Cutaneous abscess, unspecified: Secondary | ICD-10-CM | POA: Diagnosis present

## 2023-12-16 DIAGNOSIS — Z801 Family history of malignant neoplasm of trachea, bronchus and lung: Secondary | ICD-10-CM

## 2023-12-16 DIAGNOSIS — Z882 Allergy status to sulfonamides status: Secondary | ICD-10-CM

## 2023-12-16 DIAGNOSIS — Z681 Body mass index (BMI) 19 or less, adult: Secondary | ICD-10-CM | POA: Diagnosis not present

## 2023-12-16 DIAGNOSIS — K2100 Gastro-esophageal reflux disease with esophagitis, without bleeding: Secondary | ICD-10-CM | POA: Diagnosis not present

## 2023-12-16 DIAGNOSIS — R5383 Other fatigue: Secondary | ICD-10-CM | POA: Diagnosis not present

## 2023-12-16 DIAGNOSIS — Z7401 Bed confinement status: Secondary | ICD-10-CM | POA: Diagnosis not present

## 2023-12-16 DIAGNOSIS — R7401 Elevation of levels of liver transaminase levels: Secondary | ICD-10-CM | POA: Diagnosis not present

## 2023-12-16 DIAGNOSIS — Z820 Family history of epilepsy and other diseases of the nervous system: Secondary | ICD-10-CM

## 2023-12-16 DIAGNOSIS — Z79899 Other long term (current) drug therapy: Secondary | ICD-10-CM

## 2023-12-16 DIAGNOSIS — Z880 Allergy status to penicillin: Secondary | ICD-10-CM

## 2023-12-16 DIAGNOSIS — Z981 Arthrodesis status: Secondary | ICD-10-CM

## 2023-12-16 DIAGNOSIS — C240 Malignant neoplasm of extrahepatic bile duct: Secondary | ICD-10-CM | POA: Diagnosis not present

## 2023-12-16 DIAGNOSIS — Z8711 Personal history of peptic ulcer disease: Secondary | ICD-10-CM

## 2023-12-16 DIAGNOSIS — R9082 White matter disease, unspecified: Secondary | ICD-10-CM | POA: Diagnosis not present

## 2023-12-16 DIAGNOSIS — R636 Underweight: Secondary | ICD-10-CM | POA: Diagnosis present

## 2023-12-16 DIAGNOSIS — M810 Age-related osteoporosis without current pathological fracture: Secondary | ICD-10-CM | POA: Diagnosis present

## 2023-12-16 DIAGNOSIS — R1900 Intra-abdominal and pelvic swelling, mass and lump, unspecified site: Secondary | ICD-10-CM | POA: Diagnosis not present

## 2023-12-16 DIAGNOSIS — R059 Cough, unspecified: Secondary | ICD-10-CM | POA: Diagnosis not present

## 2023-12-16 DIAGNOSIS — R911 Solitary pulmonary nodule: Secondary | ICD-10-CM | POA: Diagnosis present

## 2023-12-16 DIAGNOSIS — Z91048 Other nonmedicinal substance allergy status: Secondary | ICD-10-CM

## 2023-12-16 DIAGNOSIS — Z823 Family history of stroke: Secondary | ICD-10-CM

## 2023-12-16 DIAGNOSIS — K838 Other specified diseases of biliary tract: Secondary | ICD-10-CM | POA: Diagnosis not present

## 2023-12-16 DIAGNOSIS — R17 Unspecified jaundice: Secondary | ICD-10-CM | POA: Diagnosis not present

## 2023-12-16 DIAGNOSIS — Z86718 Personal history of other venous thrombosis and embolism: Secondary | ICD-10-CM

## 2023-12-16 DIAGNOSIS — Z886 Allergy status to analgesic agent status: Secondary | ICD-10-CM

## 2023-12-16 DIAGNOSIS — C259 Malignant neoplasm of pancreas, unspecified: Secondary | ICD-10-CM | POA: Diagnosis not present

## 2023-12-16 DIAGNOSIS — F418 Other specified anxiety disorders: Secondary | ICD-10-CM | POA: Diagnosis not present

## 2023-12-16 DIAGNOSIS — K3189 Other diseases of stomach and duodenum: Secondary | ICD-10-CM | POA: Diagnosis not present

## 2023-12-16 DIAGNOSIS — I96 Gangrene, not elsewhere classified: Secondary | ICD-10-CM | POA: Diagnosis not present

## 2023-12-16 LAB — CBC WITH DIFFERENTIAL/PLATELET
Abs Immature Granulocytes: 0.04 K/uL (ref 0.00–0.07)
Basophils Absolute: 0.1 K/uL (ref 0.0–0.1)
Basophils Relative: 1 %
Eosinophils Absolute: 0.2 K/uL (ref 0.0–0.5)
Eosinophils Relative: 2 %
HCT: 38.2 % (ref 36.0–46.0)
Hemoglobin: 12.5 g/dL (ref 12.0–15.0)
Immature Granulocytes: 0 %
Lymphocytes Relative: 11 %
Lymphs Abs: 1.1 K/uL (ref 0.7–4.0)
MCH: 29.1 pg (ref 26.0–34.0)
MCHC: 32.7 g/dL (ref 30.0–36.0)
MCV: 88.8 fL (ref 80.0–100.0)
Monocytes Absolute: 0.7 K/uL (ref 0.1–1.0)
Monocytes Relative: 7 %
Neutro Abs: 7.7 K/uL (ref 1.7–7.7)
Neutrophils Relative %: 79 %
Platelets: 432 K/uL — ABNORMAL HIGH (ref 150–400)
RBC: 4.3 MIL/uL (ref 3.87–5.11)
RDW: 16.7 % — ABNORMAL HIGH (ref 11.5–15.5)
WBC: 9.7 K/uL (ref 4.0–10.5)
nRBC: 0 % (ref 0.0–0.2)

## 2023-12-16 LAB — I-STAT CHEM 8, ED
BUN: 14 mg/dL (ref 8–23)
Calcium, Ion: 1.21 mmol/L (ref 1.15–1.40)
Chloride: 106 mmol/L (ref 98–111)
Creatinine, Ser: 0.4 mg/dL — ABNORMAL LOW (ref 0.44–1.00)
Glucose, Bld: 123 mg/dL — ABNORMAL HIGH (ref 70–99)
HCT: 42 % (ref 36.0–46.0)
Hemoglobin: 14.3 g/dL (ref 12.0–15.0)
Potassium: 3.5 mmol/L (ref 3.5–5.1)
Sodium: 141 mmol/L (ref 135–145)
TCO2: 22 mmol/L (ref 22–32)

## 2023-12-16 LAB — ACETAMINOPHEN LEVEL: Acetaminophen (Tylenol), Serum: <10 ug/mL — ABNORMAL LOW (ref 10–30)

## 2023-12-16 LAB — COMPREHENSIVE METABOLIC PANEL WITH GFR
ALT: 258 U/L — ABNORMAL HIGH (ref 0–44)
AST: 155 U/L — ABNORMAL HIGH (ref 15–41)
Albumin: 3 g/dL — ABNORMAL LOW (ref 3.5–5.0)
Alkaline Phosphatase: 1181 U/L — ABNORMAL HIGH (ref 38–126)
Anion gap: 14 (ref 5–15)
BUN: 12 mg/dL (ref 8–23)
CO2: 21 mmol/L — ABNORMAL LOW (ref 22–32)
Calcium: 9.9 mg/dL (ref 8.9–10.3)
Chloride: 104 mmol/L (ref 98–111)
Creatinine, Ser: 0.44 mg/dL (ref 0.44–1.00)
GFR, Estimated: >60 mL/min (ref >60)
Glucose, Bld: 126 mg/dL — ABNORMAL HIGH (ref 70–99)
Potassium: 3.5 mmol/L (ref 3.5–5.1)
Sodium: 139 mmol/L (ref 135–145)
Total Bilirubin: 7.2 mg/dL — ABNORMAL HIGH (ref 0.0–1.2)
Total Protein: 6.7 g/dL (ref 6.5–8.1)

## 2023-12-16 LAB — RESP PANEL BY RT-PCR (RSV, FLU A&B, COVID)  RVPGX2
Influenza A by PCR: NEGATIVE
Influenza B by PCR: NEGATIVE
Resp Syncytial Virus by PCR: NEGATIVE
SARS Coronavirus 2 by RT PCR: NEGATIVE

## 2023-12-16 LAB — PROTIME-INR
INR: 1 (ref 0.8–1.2)
Prothrombin Time: 13.7 s (ref 11.4–15.2)

## 2023-12-16 LAB — LIPASE, BLOOD: Lipase: 88 U/L — ABNORMAL HIGH (ref 11–51)

## 2023-12-16 LAB — ETHANOL: Alcohol, Ethyl (B): <15 mg/dL (ref <15)

## 2023-12-16 LAB — TROPONIN I (HIGH SENSITIVITY)
Troponin I (High Sensitivity): 12 ng/L (ref <18)
Troponin I (High Sensitivity): 14 ng/L (ref <18)

## 2023-12-16 LAB — CK: Total CK: 48 U/L (ref 38–234)

## 2023-12-16 LAB — MAGNESIUM: Magnesium: 1.9 mg/dL (ref 1.7–2.4)

## 2023-12-16 LAB — HIV ANTIBODY (ROUTINE TESTING W REFLEX): HIV Screen 4th Generation wRfx: NONREACTIVE

## 2023-12-16 LAB — AMMONIA: Ammonia: 83 umol/L — ABNORMAL HIGH (ref 9–35)

## 2023-12-16 LAB — SALICYLATE LEVEL: Salicylate Lvl: <7 mg/dL — ABNORMAL LOW (ref 7.0–30.0)

## 2023-12-16 MED ORDER — IOHEXOL 350 MG/ML SOLN
75.0000 mL | Freq: Once | INTRAVENOUS | Status: AC | PRN
  Administered 2023-12-16: 75 mL via INTRAVENOUS

## 2023-12-16 MED ORDER — SODIUM CHLORIDE 0.9% FLUSH
3.0000 mL | Freq: Two times a day (BID) | INTRAVENOUS | Status: DC
  Administered 2023-12-16 – 2023-12-20 (×6): 3 mL via INTRAVENOUS

## 2023-12-16 MED ORDER — ALBUTEROL SULFATE (2.5 MG/3ML) 0.083% IN NEBU: 2.5000 mg | INHALATION_SOLUTION | Freq: Four times a day (QID) | RESPIRATORY_TRACT | Status: DC | PRN

## 2023-12-16 MED ORDER — ONDANSETRON HCL 4 MG PO TABS: 4.0000 mg | ORAL_TABLET | Freq: Four times a day (QID) | ORAL | Status: DC | PRN

## 2023-12-16 MED ORDER — ENOXAPARIN SODIUM 40 MG/0.4ML IJ SOSY
40.0000 mg | PREFILLED_SYRINGE | INTRAMUSCULAR | Status: DC
Start: 2023-12-16 — End: 2023-12-18
  Administered 2023-12-16 – 2023-12-17 (×2): 40 mg via SUBCUTANEOUS
  Filled 2023-12-16 (×2): qty 0.4

## 2023-12-16 MED ORDER — ONDANSETRON HCL 4 MG/2ML IJ SOLN
4.0000 mg | Freq: Once | INTRAMUSCULAR | Status: AC
  Administered 2023-12-16: 4 mg via INTRAVENOUS
  Filled 2023-12-16: qty 2

## 2023-12-16 MED ORDER — ONDANSETRON HCL 4 MG/2ML IJ SOLN: 4.0000 mg | Freq: Four times a day (QID) | INTRAMUSCULAR | Status: DC | PRN

## 2023-12-16 MED ORDER — SODIUM CHLORIDE 0.9 % IV SOLN: INTRAVENOUS | Status: DC

## 2023-12-16 MED ORDER — SODIUM CHLORIDE 0.9 % IV BOLUS
500.0000 mL | Freq: Once | INTRAVENOUS | Status: AC
  Administered 2023-12-16: 500 mL via INTRAVENOUS

## 2023-12-16 NOTE — Anesthesia Preprocedure Evaluation (Signed)
 Anesthesia Evaluation  Patient identified by MRN, date of birth, ID band Patient awake    Reviewed: Allergy & Precautions, NPO status , Patient's Chart, lab work & pertinent test results  Airway Mallampati: II  TM Distance: >3 FB     Dental  (+) Teeth Intact, Caps, Dental Advisory Given   Pulmonary former smoker, PE   Pulmonary exam normal breath sounds clear to auscultation       Cardiovascular + DVT  Normal cardiovascular exam Rhythm:Regular Rate:Normal     Neuro/Psych  PSYCHIATRIC DISORDERS Anxiety Depression   Dementia Huntington's disease  Neuromuscular disease    GI/Hepatic PUD,GERD  Medicated,,Elevated LFT's Hyperbilirubinemia Pancreatic Mass   Endo/Other  negative endocrine ROS    Renal/GU Renal disease  negative genitourinary   Musculoskeletal  (+) Arthritis , Osteoarthritis,  Scoliosis S/P Right BKA   Abdominal   Peds  Hematology  (+) Blood dyscrasia, anemia   Anesthesia Other Findings   Reproductive/Obstetrics                              Anesthesia Physical Anesthesia Plan  ASA: 3  Anesthesia Plan: General   Post-op Pain Management: Minimal or no pain anticipated   Induction: Intravenous and Cricoid pressure planned  PONV Risk Score and Plan: 4 or greater and Treatment may vary due to age or medical condition, Propofol  infusion, TIVA and Ondansetron   Airway Management Planned: Oral ETT  Additional Equipment: None  Intra-op Plan:   Post-operative Plan: Extubation in OR  Informed Consent: I have reviewed the patients History and Physical, chart, labs and discussed the procedure including the risks, benefits and alternatives for the proposed anesthesia with the patient or authorized representative who has indicated his/her understanding and acceptance.   Patient has DNR.  Suspend DNR and Discussed DNR with patient.   Dental advisory given  Plan Discussed  with: CRNA and Anesthesiologist  Anesthesia Plan Comments:          Anesthesia Quick Evaluation

## 2023-12-16 NOTE — ED Notes (Signed)
 Bed linen changed, diaper applied, bed pad changed. Gown changed. Pericare

## 2023-12-16 NOTE — ED Provider Notes (Addendum)
 [facility] Provider Note  CSN: [id number] Arrival date & time: 12/16/23 9166  Chief Complaint(s) Weakness and fatigue  HPI Natasha Frederick is a 67 y.o. female with past medical history as below, significant for Huntington's disease, peptic ulcer disease, right sided BKA, delirium, scoliosis who presents to the ED with complaint of weakness, jaundice, nausea  Patient arrives by EMS from SNF, report this morning of fatigue, generalized weakness, nausea and some jaundice that seems new.  Patient reports last night Natasha Frederick was having some nausea but otherwise felt okay, this morning when Natasha Frederick woke up Natasha Frederick was noted to be jaundiced, had fatigue and weakness, some worsening nausea but no emesis.  Having some generalized abdominal pain.  Natasha Frederick reports subjective fever, chills over the past couple days.  Occasional cough.  No change in bowel or bladder function, no recent falls or head injuries, no numbness or weakness to extremities, no vision changes, no dyspnea or chest pain. Mental status is at baseline per SNF via EMS  Past Medical History Past Medical History:  Diagnosis Date   Acute embolism and thrombosis of deep vein of both distal lower extremities (HCC)    Arthritis    hip   C. difficile diarrhea    Endometriosis    Huntington disease (HCC)    Peptic ulcer    Perforated gastric ulcer (HCC)    Scoliosis    Patient Active Problem List   Diagnosis Date Noted   GERD (gastroesophageal reflux disease) 11/27/2021   Osteoporosis 11/23/2021   Depression, recurrent (HCC) 05/12/2019   Below-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) 01/26/2019   Anxiety and depression 12/21/2018   Chronic back pain 11/05/2018   Cold right foot 08/06/2018   Delirium 08/01/2018   Perforated gastric ulcer s/p omental patch 07/30/2018 07/31/2018   ARF (acute renal failure) (HCC) 07/30/2018   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   Primary osteoarthritis of left hip  07/19/2014   Scoliosis of lumbar spine 01/05/2014   Home Medication(s) Prior to Admission medications   Medication Sig Start Date End Date Taking? Authorizing Provider  acetaminophen  (TYLENOL ) 500 MG tablet Take 2,000 mg by mouth in the morning.    [provider]  amantadine  (SYMMETREL ) 100 MG capsule Take 100 mg by mouth 2 (two) times daily. 11/02/22   [provider]  fluorometholone (FML) 0.1 % ophthalmic suspension Place 1 drop into both eyes 2 (two) times daily. Patient not taking: Reported on 03/27/2023 01/09/23   [provider]  MYRBETRIQ  25 MG TB24 tablet Take 25 mg by mouth daily. 12/11/22   [provider]  nystatin  cream (MYCOSTATIN ) apply to affected rash twice daily as needed 05/03/23   Micheal Wolm ORN, MD  QUEtiapine  (SEROQUEL ) 25 MG tablet Take 1 tablet 2 times daily as needed for agitation 05/03/23   Burchette, Wolm ORN, MD  RESTASIS  0.05 % ophthalmic emulsion Place 1 drop into both eyes 2 (two) times daily. 02/12/23   [provider]  risperidone  (RISPERDAL ) 4 MG tablet Take 4 mg by mouth 2 (two) times daily.    [provider]  sertraline  (ZOLOFT ) 100 MG tablet TAKE 1 TABLET(100 MG) BY MOUTH DAILY 05/08/23   Burchette, Wolm ORN, MD  Vibegron  (GEMTESA ) 75 MG TABS Take 1 tablet by mouth daily Patient taking differently: Take 75 mg by mouth at bedtime. Take 1 tablet by mouth daily 10/24/22   [place], Betty G, MD  Past Surgical History Past Surgical History:  Procedure Laterality Date   AMPUTATION Right 08/08/2018   Procedure: AMPUTATION BELOW KNEE RIGHT LOWER EXTREMITY;  Surgeon: Oris Krystal FALCON, MD;  Location: [facility] OR;  Service: Vascular;  Laterality: Right;   ANTERIOR LAT LUMBAR FUSION Left 01/05/2014   Procedure: LUMBAR TWO TO THREE, LUMBAR LUMBAR THREE TO FOUR, ANTERIOR LATERAL LUMBAR FUSION 2 LEVELS;   Surgeon: Darina MALVA Boehringer, MD;  Location: [facility];  Service: Neurosurgery;  Laterality: Left;   APPENDECTOMY  1963   BIOPSY  11/25/2018   Procedure: BIOPSY;  Surgeon: Legrand Victory LITTIE DOUGLAS, MD;  Location: [facility];  Service: Gastroenterology;;   BREAST SURGERY Left 1987   bx   COLONOSCOPY W/ POLYPECTOMY     ESOPHAGOGASTRODUODENOSCOPY (EGD) WITH PROPOFOL  N/A 11/25/2018   Procedure: ESOPHAGOGASTRODUODENOSCOPY (EGD) WITH PROPOFOL ;  Surgeon: Legrand Victory LITTIE DOUGLAS, MD;  Location: [facility];  Service: Gastroenterology;  Laterality: N/A;   EYE SURGERY Bilateral    FEMORAL-POPLITEAL BYPASS GRAFT Bilateral 08/06/2018   Procedure: BILATERAL POPLITEAL AND TIBIAL EMBOLECTOMIES, LEFT LEG FASCIOTOMY;  Surgeon: Oris Krystal FALCON, MD;  Location: [facility] OR;  Service: Vascular;  Laterality: Bilateral;   LAPAROSCOPY N/A 07/30/2018   Procedure: DIAGNOSTIC LAPAROSCOPY, Omentopexy gram patch, Washout of intra-abdominal abcesses x 3;  Surgeon: Sheldon Standing, MD;  Location: [facility];  Service: General;  Laterality: N/A;   OOPHORECTOMY Left 1977   Planters wart Left 2005   radial keratoto Bilateral    TONSILLECTOMY  1958   TOTAL HIP ARTHROPLASTY Left 07/19/2014   Procedure: TOTAL HIP ARTHROPLASTY;  Surgeon: Dempsey Sensor, MD;  Location: [facility] OR;  Service: Orthopedics;  Laterality: Left;   TUBAL LIGATION  1986   Family History Family History  Problem Relation Age of Onset   Lung cancer Mother    Stroke Father    Huntington's disease Paternal Grandmother    Huntington's disease Paternal Aunt    Huntington's disease Paternal Uncle    Arthritis Neg Hx        family hx   Cancer Neg Hx        prostate ca -family hx   Heart disease Neg Hx        family hx   Colon cancer Neg Hx    Esophageal cancer Neg Hx    Pancreatic cancer Neg Hx    Liver disease Neg Hx     Social History Social History   Tobacco Use   Smoking status: Former    Current packs/day: 0.00    Average packs/day: 1 pack/day for 8.0 years (8.0 ttl  pk-yrs)    Types: Cigarettes    Start date: 01/17/1986    Quit date: 01/17/1994    Years since quitting: 29.9   Smokeless tobacco: Never  Vaping Use   Vaping status: Never Used  Substance Use Topics   Alcohol use: Yes    Alcohol/week: 3.0 standard drinks of alcohol    Types: 3 Cans of beer per week    Comment: per week   Drug use: No   Allergies Bee venom, Nsaids, Adhesive [tape], Codeine, Penicillins, and Sulfa antibiotics  Review of Systems A thorough review of systems was obtained and all systems are negative except as noted in the HPI and PMH.   Physical Exam Vital Signs  I have reviewed the triage vital signs BP 115/74   Pulse 72   Temp 98.1 F (36.7 C) (Oral)   Resp 16   Ht 5' 5 (1.651 m)   Wt 47.6  kg   SpO2 99%   BMI 17.47 kg/m  Physical Exam Vitals and nursing note reviewed.  Constitutional:      General: Natasha Frederick is not in acute distress.    Appearance: Normal appearance.  HENT:     Head: Normocephalic and atraumatic.     Right Ear: External ear normal.     Left Ear: External ear normal.     Nose: Nose normal.     Mouth/Throat:     Mouth: Mucous membranes are moist.  Eyes:     General: Scleral icterus present.        Right eye: No discharge.        Left eye: No discharge.     Extraocular Movements: Extraocular movements intact.     Pupils: Pupils are equal, round, and reactive to light.  Cardiovascular:     Rate and Rhythm: Normal rate and regular rhythm.     Pulses: Normal pulses.     Heart sounds: Normal heart sounds.  Pulmonary:     Effort: Pulmonary effort is normal. No respiratory distress.     Breath sounds: Normal breath sounds. No stridor.  Abdominal:     General: Abdomen is flat. There is no distension.     Palpations: Abdomen is soft.     Tenderness: There is abdominal tenderness.  Musculoskeletal:     Cervical back: No rigidity.     Right lower leg: No edema.     Left lower leg: No edema.     Comments: R BKA  Skin:    General: Skin  is warm and dry.     Capillary Refill: Capillary refill takes less than 2 seconds.  Neurological:     Mental Status: Natasha Frederick is alert and oriented to person, place, and time.     GCS: GCS eye subscore is 4. GCS verbal subscore is 5. GCS motor subscore is 6.     Cranial Nerves: No facial asymmetry.     Sensory: Sensation is intact. No sensory deficit.     Motor: Weakness present.     Coordination: Coordination is intact.     Comments: Gait testing deferred secondary to patient safety.  Contractions noted UE  Responses are slowed but appropriate   Weakness is generalized   No asterixis   Psychiatric:        Mood and Affect: Mood normal.        Behavior: Behavior normal. Behavior is cooperative.     ED Results and Treatments Labs (all labs ordered are listed, but only abnormal results are displayed) Labs Reviewed  CBC WITH DIFFERENTIAL/PLATELET - Abnormal; Notable for the following components:      Result Value   RDW 16.7 (*)    Platelets 432 (*)    All other components within normal limits  COMPREHENSIVE METABOLIC PANEL WITH GFR - Abnormal; Notable for the following components:   CO2 21 (*)    Glucose, Bld 126 (*)    Albumin  3.0 (*)    AST 155 (*)    ALT 258 (*)    Alkaline Phosphatase 1,181 (*)    Total Bilirubin 7.2 (*)    All other components within normal limits  AMMONIA - Abnormal; Notable for the following components:   Ammonia 83 (*)    All other components within normal limits  ACETAMINOPHEN  LEVEL - Abnormal; Notable for the following components:   Acetaminophen  (Tylenol ), Serum <10 (*)    All other components within normal limits  SALICYLATE LEVEL - Abnormal; Notable  for the following components:   Salicylate Lvl <7.0 (*)    All other components within normal limits  I-STAT CHEM 8, ED - Abnormal; Notable for the following components:   Creatinine, Ser 0.40 (*)    Glucose, Bld 123 (*)    All other components within normal limits  RESP PANEL BY RT-PCR (RSV, FLU  A&B, COVID)  RVPGX2  CK  MAGNESIUM   ETHANOL  PROTIME-INR  LIPASE, BLOOD  URINALYSIS, ROUTINE W REFLEX MICROSCOPIC  RAPID URINE DRUG SCREEN, HOSP PERFORMED  TROPONIN I (HIGH SENSITIVITY)  TROPONIN I (HIGH SENSITIVITY)                                                                                                                          Radiology CT ABDOMEN PELVIS W CONTRAST Result Date: 12/16/2023 CLINICAL DATA:  Abdominal pain, acute, nonlocalized abd pain, weakness, new jaundice. * Tracking Code: BO * EXAM: CT ABDOMEN AND PELVIS WITH CONTRAST TECHNIQUE: Multidetector CT imaging of the abdomen and pelvis was performed using the standard protocol following bolus administration of intravenous contrast. RADIATION DOSE REDUCTION: This exam was performed according to the departmental dose-optimization program which includes automated exposure control, adjustment of the mA and/or kV according to patient size and/or use of iterative reconstruction technique. CONTRAST:  75mL OMNIPAQUE  IOHEXOL  350 MG/ML SOLN COMPARISON:  Abdominopelvic CT 07/30/2018.  Chest CT 07/14/2021. FINDINGS: Technical note: Despite efforts by the technologist and patient, mild motion artifact is present on today's exam and could not be eliminated. This reduces exam sensitivity and specificity. Lower chest: 5 mm subpleural right lower lobe nodule on image 3/7 appears new from previous chest CT. The lung bases are otherwise clear. No significant pleural or pericardial effusion. Hepatobiliary: The liver has a non cirrhotic morphology without focal parenchymal abnormality. However, there is new severe diffuse intrahepatic biliary dilatation associated with moderate distention of the gallbladder. The common hepatic duct is dilated to 2.3 cm. There is focal narrowing of the common bile duct above the pancreatic head with suspected ductal wall enhancement. Pancreas: New moderate dilatation of the pancreatic duct within the pancreatic tail.  There is an ill-defined low-density mass within the pancreatic neck, measuring 1.6 x 1.3 cm on image 28/5. Findings are highly concerning for pancreatic malignancy with associated double duct sign. Spleen: New contour irregularity and nodularity of the spleen without focally suspicious parenchymal lesion. Adrenals/Urinary Tract: Both adrenal glands appear normal. No evidence of urinary tract calculus, suspicious renal lesion or hydronephrosis. The bladder appears unremarkable for its degree of distention. Stomach/Bowel: No enteric contrast administered. The stomach appears unremarkable for its degree of distension. No evidence of bowel wall thickening, distention or surrounding inflammatory change. There is a large amount of stool within the rectum. Vascular/Lymphatic: No enlarged abdominopelvic lymph nodes are identified. There is aortic and branch vessel atherosclerosis without evidence of aneurysm or large vessel occlusion. New extrinsic venous narrowing at the portal vein/SMV confluence without evidence of acute thrombosis. Reproductive: The uterus and ovaries  appear unremarkable. No evidence of adnexal mass. Tubal ligation clips noted on the right. Other: Previously demonstrated ascites has resolved. No peritoneal nodularity or pneumoperitoneum identified. Musculoskeletal: No acute or significant osseous findings. Status post left total hip arthroplasty and 2 level lumbar fusion. There is a convex left thoracolumbar scoliosis with multilevel spondylosis. Moderate right hip degenerative changes are noted. Old fractures of the right pubic rami. IMPRESSION: 1. New severe intrahepatic and extrahepatic biliary dilatation with moderate pancreatic ductal dilatation an ill-defined low-density mass in the pancreatic neck, highly concerning for pancreatic malignancy. 2. New extrinsic venous narrowing at the portal vein/SMV confluence without evidence of acute thrombosis, worrisome for tumor unresectability. 3. New  contour irregularity and nodularity of the spleen without focally suspicious parenchymal lesion. 4. New 5 mm subpleural right lower lobe pulmonary nodule, nonspecific, potentially a metastasis. Consider further evaluation full chest CT. No evidence of distant metastatic disease in the abdomen or pelvis. 5.  Aortic Atherosclerosis (ICD10-I70.0). Electronically Signed   By: Elsie Perone M.D.   On: 12/16/2023 10:26   CT Head Wo Contrast Result Date: 12/16/2023 EXAM: CT HEAD WITHOUT CONTRAST 12/16/2023 09:57:34 AM TECHNIQUE: CT of the head was performed without the administration of intravenous contrast. Automated exposure control, iterative reconstruction, and/or weight based adjustment of the mA/kV was utilized to reduce the radiation dose to as low as reasonably achievable. COMPARISON: 03/31/2001 CLINICAL HISTORY: Delirium. FINDINGS: BRAIN AND VENTRICLES: Moderate generalized cerebral and cerebellar volume loss. Chronic encephalomalacic changes within the frontal lobes bilaterally and the left cerebellar hemisphere. Mild-to-moderate periventricular white matter disease. No acute hemorrhage. No evidence of acute infarct. No hydrocephalus. No extra-axial collection. No mass effect or midline shift. ORBITS: No acute abnormality. SINUSES: No acute abnormality. SOFT TISSUES AND SKULL: No acute soft tissue abnormality. No skull fracture. Calcifications within the carotid siphons. IMPRESSION: 1. No acute intracranial abnormality. 2. Chronic encephalomalacic changes within the frontal lobes bilaterally and the left cerebellar hemisphere. 3. Mild-to-moderate periventricular white matter disease. Electronically signed by: Evalene Coho MD 12/16/2023 10:09 AM EDT RP Workstation: [id number]   DG Chest Portable 1 View Result Date: 12/16/2023 CLINICAL DATA:  Cough. EXAM: PORTABLE CHEST 1 VIEW COMPARISON:  07/03/2021. FINDINGS: Bilateral lung fields are clear. Linear calcifications noted along the left diaphragmatic  pleura, nonspecific but commonly seen with asbestos related disease. Bilateral costophrenic angles are clear. No pneumothorax. Stable cardio-mediastinal silhouette. There is unfolding of aortic arch. No acute osseous abnormalities. The soft tissues are within normal limits. IMPRESSION: No active disease. Electronically Signed   By: Ree Molt M.D.   On: 12/16/2023 09:02    Pertinent labs & imaging results that were available during my care of the patient were reviewed by me and considered in my medical decision making (see MDM for details).  Medications Ordered in ED Medications  iohexol  (OMNIPAQUE ) 350 MG/ML injection 75 mL (75 mLs Intravenous Contrast Given 12/16/23 0957)  ondansetron  (ZOFRAN ) injection 4 mg (4 mg Intravenous Given 12/16/23 1125)  sodium chloride  0.9 % bolus 500 mL (500 mLs Intravenous New Bag/Given 12/16/23 1125)  Procedures .Critical Care  Performed by: Elnor Jayson LABOR, DO Authorized by: Elnor Jayson LABOR, DO   Critical care provider statement:    Critical care time (minutes):  30   Critical care time was exclusive of:  Separately billable procedures and treating other patients   Critical care was necessary to treat or prevent imminent or life-threatening deterioration of the following conditions:  Hepatic failure   Critical care was time spent personally by me on the following activities:  Development of treatment plan with patient or surrogate, discussions with consultants, evaluation of patient's response to treatment, examination of patient, ordering and review of laboratory studies, ordering and review of radiographic studies, ordering and performing treatments and interventions, pulse oximetry, re-evaluation of patient's condition, review of old charts and obtaining history from patient or surrogate   Care discussed with: admitting provider      (including critical care time)  Medical Decision Making / ED Course    Medical Decision Making:    Timberlee Roblero is a 68 y.o. female with past medical history as below, significant for Huntington's disease, peptic ulcer disease, right sided BKA, delirium, scoliosis who presents to the ED with complaint of weakness, jaundice, nausea. The complaint involves an extensive differential diagnosis and also carries with it a high risk of complications and morbidity.  Serious etiology was considered. Ddx includes but is not limited to: Differential diagnosis includes but is not exclusive to acute cholecystitis, intrathoracic causes for epigastric abdominal pain, gastritis, duodenitis, pancreatitis, small bowel or large bowel obstruction, abdominal aortic aneurysm, hernia, gastritis, cholangitis, liver failure, infection, metabolic derangement, dehydration, ACS, COVID etc.   Complete initial physical exam performed, notably the patient was in no acute distress, Natasha Frederick is Jaundiced.    Reviewed and confirmed nursing documentation for past medical history, family history, social history.  Vital signs reviewed.    Fatigue/generalized weakness Jaundice  Abd pain  AMS > - LFTs are acutely elevated, Natasha Frederick has hyperbilirubinemia.  Ammonia elevated.  CT abdomen pelvis revealing for new pancreatic mass, biliary ductal dilation - Spoke with [facility] PA [place], will come see in consult (pt f/w [facility] in office) - Incidentally pulmonary nodule noted, possible metastatic disease.  Also splenic irregularity - Patient with likely hepatic encephalopathy, hyperbilirubinemia elevated transaminases.  New pancreas mass.  Recommend admission.  Natasha Frederick is agreeable    Admit [facility] w/ GI consult                  Additional history obtained: -Additional history obtained from ems -External records from outside source obtained and reviewed including: Chart review including previous notes, labs, imaging, consultation  notes including  Primary care documentation   Lab Tests: -I ordered, reviewed, and interpreted labs.   The pertinent results include:   Labs Reviewed  CBC WITH DIFFERENTIAL/PLATELET - Abnormal; Notable for the following components:      Result Value   RDW 16.7 (*)    Platelets 432 (*)    All other components within normal limits  COMPREHENSIVE METABOLIC PANEL WITH GFR - Abnormal; Notable for the following components:   CO2 21 (*)    Glucose, Bld 126 (*)    Albumin  3.0 (*)    AST 155 (*)    ALT 258 (*)    Alkaline Phosphatase 1,181 (*)    Total Bilirubin 7.2 (*)    All other components within normal limits  AMMONIA - Abnormal; Notable for the following components:   Ammonia 83 (*)    All other components within  normal limits  ACETAMINOPHEN  LEVEL - Abnormal; Notable for the following components:   Acetaminophen  (Tylenol ), Serum <10 (*)    All other components within normal limits  SALICYLATE LEVEL - Abnormal; Notable for the following components:   Salicylate Lvl <7.0 (*)    All other components within normal limits  I-STAT CHEM 8, ED - Abnormal; Notable for the following components:   Creatinine, Ser 0.40 (*)    Glucose, Bld 123 (*)    All other components within normal limits  RESP PANEL BY RT-PCR (RSV, FLU A&B, COVID)  RVPGX2  CK  MAGNESIUM   ETHANOL  PROTIME-INR  LIPASE, BLOOD  URINALYSIS, ROUTINE W REFLEX MICROSCOPIC  RAPID URINE DRUG SCREEN, HOSP PERFORMED  TROPONIN I (HIGH SENSITIVITY)  TROPONIN I (HIGH SENSITIVITY)    Notable for as above, LFT+ bili +, alk phos+ ammonia +  EKG   EKG Interpretation Date/Time:  Monday December 16 2023 08:51:44 EDT Ventricular Rate:  72 PR Interval:  101 QRS Duration:  106 QT Interval:  421 QTC Calculation: 461 R Axis:   74  Text Interpretation: Sinus rhythm Short PR interval RSR' in V1 or V2, probably normal variant no stemi Confirmed by Elnor Savant ([id number]) on 12/16/2023 9:09:38 AM         Imaging Studies ordered: I  ordered imaging studies including CXR CTH CTAP I independently visualized the following imaging with scope of interpretation limited to determining acute life threatening conditions related to emergency care; findings noted above I agree with the radiologist interpretation If any imaging was obtained with contrast I closely monitored patient for any possible adverse reaction a/w contrast administration in the emergency department   Medicines ordered and prescription drug management: Meds ordered this encounter  Medications   iohexol  (OMNIPAQUE ) 350 MG/ML injection 75 mL   ondansetron  (ZOFRAN ) injection 4 mg   sodium chloride  0.9 % bolus 500 mL    -I have reviewed the patients home medicines and have made adjustments as needed   Consultations Obtained: I requested consultation with the GI, hospitalist,  and discussed lab and imaging findings as well as pertinent plan - they recommend: admit   Cardiac Monitoring: Continuous pulse oximetry interpreted by myself, 99% on RA.    Social Determinants of Health:  Diagnosis or treatment significantly limited by social determinants of health: former smoker   Reevaluation: After the interventions noted above, I reevaluated the patient and found that they have stayed the same  Co morbidities that complicate the patient evaluation  Past Medical History:  Diagnosis Date   Acute embolism and thrombosis of deep vein of both distal lower extremities (HCC)    Arthritis    hip   C. difficile diarrhea    Endometriosis    Huntington disease (HCC)    Peptic ulcer    Perforated gastric ulcer (HCC)    Scoliosis       Dispostion: Disposition decision including need for hospitalization was considered, and patient admitted to the hospital.    Final Clinical Impression(s) / ED Diagnoses Final diagnoses:  Hepatic encephalopathy (HCC)  Pancreatic mass  Hyperbilirubinemia  Elevated liver enzymes  Pulmonary nodule        Elnor Savant LABOR, DO 12/16/23 1116    Elnor Savant A, DO 12/16/23 1145

## 2023-12-16 NOTE — H&P (View-Only) (Signed)
 Consultation  Referring Provider: ER MD/Gray Primary Care Physician:  Micheal Wolm ORN, MD Primary Gastroenterologist:  Dr.Danis  Reason for Consultation: New onset jaundice/abnormal imaging of pancreas  HPI: Marcelle Hepner is a 68 y.o. female with history of Huntington's disease, currently residing at a skilled nursing facility, previous right BKA, prior perforated gastric ulcer requiring Arlyss patch remotely, and severe scoliosis who was brought to the emergency room with complaints of weakness and nausea present over the past couple of days.  Noted to be jaundiced this morning.  She has not had any vomiting.  Not aware of any fever or chills.  Difficult historian but says yes to complaint of recent abdominal pain.  Per ER notes mental status felt to be at her baseline per SNF/EMS. She also has prior history of DVTs, endometriosis, prior C. Difficile.  She had undergone EGD in 2020 for follow-up after Arlyss patch done in April 2024 gastric ulcer with perforation.  She was noted to have a widely patent Schatzki's ring, normal-appearing esophagus 1 nonbleeding superficial gastric ulcer at the incisura 4 mm, and otherwise negative exam.  Labs in the ER today with WBC 9.7/hemoglobin 12.5/hematocrit 38.2/lipase within normal limits Potassium 3.5/BUN 12/creatinine 0.44 T. bili 7.2/alk phos 1181/AST 155/ALT 258 Acetaminophen  level less than 10/CK 48 INR 1.0.  CT of the abdomen pelvis with contrast today shows new severe diffuse intrahepatic biliary dilation associated with moderate distention of the gallbladder common hepatic duct 2.3 cm focal narrowing of the common bile duct above the pancreatic head with suspected ductal wall enhancement, new moderate dilation of the pancreatic duct within the pancreatic tail and an ill-defined low-density mass within the pancreatic neck measuring 1.6 x 1.3 cm highly concerning for pancreatic malignancy with associated double duct sign.  There is new contour  irregularity and nodularity of the spleen without focally suspicious parenchymal lesion, also noted new extrinsic venous narrowing at the portal vein/SMV confluence without evidence of acute thrombosis worrisome for tumor unresectability 1 new 5 mm subpleural right lower lobe pulmonary nodule potentially a metastasis.   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 difficile diarrhea    Endometriosis    Huntington disease (HCC)    Peptic ulcer    Perforated gastric ulcer (HCC)    Scoliosis     Past Surgical History:  Procedure Laterality Date   AMPUTATION Right 08/08/2018   Procedure: AMPUTATION BELOW KNEE RIGHT LOWER EXTREMITY;  Surgeon: Oris Krystal FALCON, MD;  Location: [facility] OR;  Service: Vascular;  Laterality: Right;   ANTERIOR LAT LUMBAR FUSION Left 01/05/2014   Procedure: LUMBAR TWO TO THREE, LUMBAR LUMBAR THREE TO FOUR, ANTERIOR LATERAL LUMBAR FUSION 2 LEVELS;  Surgeon: Darina MALVA Boehringer, MD;  Location: [facility];  Service: Neurosurgery;  Laterality: Left;   APPENDECTOMY  1963   BIOPSY  11/25/2018   Procedure: BIOPSY;  Surgeon: Legrand Victory LITTIE DOUGLAS, MD;  Location: [facility];  Service: Gastroenterology;;   BREAST SURGERY Left 1987   bx   COLONOSCOPY W/ POLYPECTOMY     ESOPHAGOGASTRODUODENOSCOPY (EGD) WITH PROPOFOL  N/A 11/25/2018   Procedure: ESOPHAGOGASTRODUODENOSCOPY (EGD) WITH PROPOFOL ;  Surgeon: Legrand Victory LITTIE DOUGLAS, MD;  Location: [facility];  Service: Gastroenterology;  Laterality: N/A;   EYE SURGERY Bilateral    FEMORAL-POPLITEAL BYPASS GRAFT Bilateral 08/06/2018   Procedure: BILATERAL POPLITEAL AND TIBIAL EMBOLECTOMIES, LEFT LEG FASCIOTOMY;  Surgeon: Oris Krystal FALCON, MD;  Location: [facility] OR;  Service: Vascular;  Laterality: Bilateral;   LAPAROSCOPY N/A 07/30/2018   Procedure: DIAGNOSTIC LAPAROSCOPY, Omentopexy gram patch, Washout of intra-abdominal abcesses x 3;  Surgeon: Sheldon Standing,  MD;  Location: [facility] ORS;  Service: General;  Laterality: N/A;   OOPHORECTOMY Left 1977   Planters wart Left 2005   radial keratoto Bilateral    TONSILLECTOMY  1958   TOTAL HIP ARTHROPLASTY Left 07/19/2014   Procedure: TOTAL HIP ARTHROPLASTY;  Surgeon: Dempsey Sensor, MD;  Location: [facility] OR;  Service: Orthopedics;  Laterality: Left;   TUBAL LIGATION  1986    Prior to Admission medications   Medication Sig Start Date End Date Taking? Authorizing Provider  acetaminophen  (TYLENOL ) 500 MG tablet Take 2,000 mg by mouth in the morning.    [provider]  amantadine  (SYMMETREL ) 100 MG capsule Take 100 mg by mouth 2 (two) times daily. 11/02/22   [provider]  fluorometholone (FML) 0.1 % ophthalmic suspension Place 1 drop into both eyes 2 (two) times daily. Patient not taking: Reported on 03/27/2023 01/09/23   [provider]  MYRBETRIQ  25 MG TB24 tablet Take 25 mg by mouth daily. 12/11/22   [provider]  nystatin  cream (MYCOSTATIN ) apply to affected rash twice daily as needed 05/03/23   Micheal Wolm ORN, MD  QUEtiapine  (SEROQUEL ) 25 MG tablet Take 1 tablet 2 times daily as needed for agitation 05/03/23   Burchette, Wolm ORN, MD  RESTASIS  0.05 % ophthalmic emulsion Place 1 drop into both eyes 2 (two) times daily. 02/12/23   [provider]  risperidone  (RISPERDAL ) 4 MG tablet Take 4 mg by mouth 2 (two) times daily.    [provider]  sertraline  (ZOLOFT ) 100 MG tablet TAKE 1 TABLET(100 MG) BY MOUTH DAILY 05/08/23   Burchette, Wolm ORN, MD  Vibegron  (GEMTESA ) 75 MG TABS Take 1 tablet by mouth daily Patient taking differently: Take 75 mg by mouth at bedtime. Take 1 tablet by mouth daily 10/24/22   [place], Betty G, MD    No current facility-administered medications for this encounter.   Current Outpatient Medications  Medication Sig Dispense Refill   acetaminophen  (TYLENOL ) 500 MG tablet Take 2,000 mg by mouth in the morning.     amantadine  (SYMMETREL )  100 MG capsule Take 100 mg by mouth 2 (two) times daily.     fluorometholone (FML) 0.1 % ophthalmic suspension Place 1 drop into both eyes 2 (two) times daily. (Patient not taking: Reported on 03/27/2023)     MYRBETRIQ  25 MG TB24 tablet Take 25 mg by mouth daily.     nystatin  cream (MYCOSTATIN ) apply to affected rash twice daily as needed 30 g 0   QUEtiapine  (SEROQUEL ) 25 MG tablet Take 1 tablet 2 times daily as needed for agitation 60 tablet 2   RESTASIS  0.05 % ophthalmic emulsion Place 1 drop into both eyes 2 (two) times daily.     risperidone  (RISPERDAL ) 4 MG tablet Take 4 mg by mouth 2 (two) times daily.     sertraline  (ZOLOFT ) 100 MG tablet TAKE 1 TABLET(100 MG) BY MOUTH DAILY 30 tablet 0   Vibegron  (GEMTESA ) 75 MG TABS Take 1 tablet by mouth daily (Patient taking differently: Take 75 mg by mouth at bedtime. Take 1 tablet by mouth daily) 90 tablet 1    Allergies as of 12/16/2023 - Review Complete 12/16/2023  Allergen Reaction Noted   Bee venom Anaphylaxis and Swelling 12/25/2013   Nsaids Other (See Comments)  07/31/2018   Adhesive [tape] Other (See Comments) 07/09/2014   Codeine Nausea And Vomiting 01/20/2015   Penicillins  01/20/2015   Sulfa antibiotics Itching 01/20/2015    Family History  Problem Relation Age of Onset   Lung cancer Mother    Stroke Father    Huntington's disease Paternal Grandmother    Huntington's disease Paternal Aunt    Huntington's disease Paternal Uncle    Arthritis Neg Hx        family hx   Cancer Neg Hx        prostate ca -family hx   Heart disease Neg Hx        family hx   Colon cancer Neg Hx    Esophageal cancer Neg Hx    Pancreatic cancer Neg Hx    Liver disease Neg Hx     Social History   Socioeconomic History   Marital status: Married    Spouse name: Not on file   Number of children: Not on file   Years of education: Not on file   Highest education level: Not on file  Occupational History   Occupation: unemployed    Comment:  disabled back pain  Tobacco Use   Smoking status: Former    Current packs/day: 0.00    Average packs/day: 1 pack/day for 8.0 years (8.0 ttl pk-yrs)    Types: Cigarettes    Start date: 01/17/1986    Quit date: 01/17/1994    Years since quitting: 29.9   Smokeless tobacco: Never  Vaping Use   Vaping status: Never Used  Substance and Sexual Activity   Alcohol use: Yes    Alcohol/week: 3.0 standard drinks of alcohol    Types: 3 Cans of beer per week    Comment: per week   Drug use: No   Sexual activity: Not on file  Other Topics Concern   Not on file  Social History Narrative   Married   Right BKA    Disabled due to back problems       Social Drivers of Health   Financial Resource Strain: Low Risk  (09/21/2021)   Overall Financial Resource Strain (CARDIA)    Difficulty of Paying Living Expenses: Not hard at all  Food Insecurity: No Food Insecurity (09/26/2022)   Hunger Vital Sign    Worried About Running Out of Food in the Last Year: Never true    Ran Out of Food in the Last Year: Never true  Transportation Needs: No Transportation Needs (09/26/2022)   PRAPARE - Administrator, Civil Service (Medical): No    Lack of Transportation (Non-Medical): No  Physical Activity: Insufficiently Active (09/26/2022)   Exercise Vital Sign    Days of Exercise per Week: 2 days    Minutes of Exercise per Session: 60 min  Stress: No Stress Concern Present (09/26/2022)   [employer] of Occupational Health - Occupational Stress Questionnaire    Feeling of Stress : Not at all  Social Connections: Moderately Isolated (09/26/2022)   Social Connection and Isolation Panel    Frequency of Communication with Friends and Family: More than three times a week    Frequency of Social Gatherings with Friends and Family: More than three times a week    Attends Religious Services: Never    Database administrator or Organizations: No    Attends Banker Meetings: Never    Marital  Status: Married  Catering manager Violence: Not At Risk (09/26/2022)   Humiliation, Afraid,  Rape, and Kick questionnaire    Fear of Current or Ex-Partner: No    Emotionally Abused: No    Physically Abused: No    Sexually Abused: No    Review of Systems: Pertinent positive and negative review of systems were noted in the above HPI section.  All other review of systems was otherwise negative.   Physical Exam: Vital signs in last 24 hours: Temp:  [98.1 F (36.7 C)] 98.1 F (36.7 C) (09/08 1232) Pulse Rate:  [67-72] 69 (09/08 1230) Resp:  [16-24] 19 (09/08 1230) BP: (115-142)/(66-82) 142/82 (09/08 1230) SpO2:  [97 %-99 %] 98 % (09/08 1230) Weight:  [47.6 kg] 47.6 kg (09/08 0841)   General:   Alert,  Well-developed, thin, frail older white female  cooperative in NAD, curled up with knees to chest, movement disorder Head:  Normocephalic and atraumatic. Eyes:  Sclera icteric, conjunctiva pink. Ears:  Normal auditory acuity. Nose:  No deformity, discharge,  or lesions. Mouth:  No deformity or lesions.   Neck:  Supple; no masses or thyromegaly. Lungs:  Clear throughout to auscultation.   No wheezes, crackles, or rhonchi. Heart:  Regular rate and rhythm; no murmurs, clicks, rubs,  or gallops. Abdomen:  Soft, some mild tenderness in the epigastrium, no guarding or rebound ,BS active,nonpalp mass or hsm.   Rectal: Not done Msk:  Symmetrical without gross deformities. . Pulses:  Normal pulses noted. Extremities:  Without clubbing or edema. Neurologic:  Alert and  oriented x2,. Skin:  Intact without significant lesions or rashes.. Psych:  Alert and cooperative. Normal mood and affect.  Intake/Output from previous day: No intake/output data recorded. Intake/Output this shift: No intake/output data recorded.  Lab Results: Recent Labs    12/16/23 0842 12/16/23 0913  WBC 9.7  --   HGB 12.5 14.3  HCT 38.2 42.0  PLT 432*  --    BMET Recent Labs    12/16/23 0842 12/16/23 0913   NA 139 141  K 3.5 3.5  CL 104 106  CO2 21*  --   GLUCOSE 126* 123*  BUN 12 14  CREATININE 0.44 0.40*  CALCIUM  9.9  --    LFT Recent Labs    12/16/23 0842  PROT 6.7  ALBUMIN  3.0*  AST 155*  ALT 258*  ALKPHOS 1,181*  BILITOT 7.2*   PT/INR Recent Labs    12/16/23 0842  LABPROT 13.7  INR 1.0       IMPRESSION:  #44 68 year old female with Huntington's disease, complicated by neurogenic dysphagia (on pured diet with thickened liquids), and mild cognitive impairment per previous notes he was brought to the emergency room with complaints of weakness and fatigue and new onset jaundice noticed within the past 24 hours  Lab workup here revealed T. bili of 7.2/alk phos 1181/AST 155/ALT 258 CBC unremarkable CT abdomen pelvis shows new severe intrahepatic and extrahepatic biliary ductal dilation with moderate pancreatic ductal dilation and an ill-defined low-density mass in the pancreatic neck concerning for pancreatic malignancy.  There is also some new extrinsic venous narrowing at the portal vein/SMV confluence worrisome for tumor unresectability, new contour irregularity and nodularity of the spleen without focally suspicious lesion and a 5 mm subpleural right lower lobe nodule possible metastasis  #2 status post right BKA #3 previous history of perforated gastric ulcer 2020 status post Arlyss patch #4 previous DVT  #5 CT abdomen addendum with mention of a possible abscess near the right ischial tuberosity measuring 2.9 x 2.5 cm  Plan; pured diet with  thick liquids, n.p.o. past midnight CA 19-9 Patient will be scheduled for ERCP with brushings and stent placement with Dr. Avram for tomorrow 12/17/2023.  I discussed the procedure in detail with the patient including indications risks and benefits.  I am not certain she can consent for herself, will also discuss with her husband for consent.  Will need to discuss further management of the possible abscess near the right ischial  tuberosity-she has antibiotic allergies which will need to be considered.      Amy EsterwoodPA-C  12/16/2023, 1:41 PM     [facility] Attending   I have taken an interval history, reviewed the chart and examined the patient. I agree with the Advanced Practitioner's note, impression and recommendations with the following additions:  I have also reviewed imaging.  Patient with Huntington's chorea and chronic cognitive impairment, now with pancreatic mass in the neck causing biliary and pancreatic duct dilation, concerning for carcinoma the pancreas.  I explained this to the patient and her husband who was present.  I have reviewed the risks benefits and indications of ERCP.  I have specifically mention the possibility of causing pancreatitis, injury requiring surgery, anesthesia complications, bleeding and the other typical risks of endoscopic procedures.   Plan for ERCP with bile duct brushings and stent placement tomorrow.  Will consent husband given patient's cognitive impairment.  Notes she has hyperammonemia but I do not think this is hepatic encephalopathy.  If that is nondiagnostic then EUS with FNA should be considered.  Await CA 19-9 also.  At this point based upon CT findings does not appear to be a surgical candidate and I think that would be highly unlikely anyway given her overall situation.  Possible abscess near right ischial tuberosity-defer to medicine service.  Could need aspiration from IR if possible.    Lupita CHARLENA Avram, MD, [facility] See TRACEY on call - gastroenterology for best contact person 12/16/2023 3:08 PM

## 2023-12-16 NOTE — ED Triage Notes (Signed)
 PT BIB [facility] from nursing home d/t feeling unwell and fatigue. Staff said she appeared jaundice (eyes/skin). Pt has a history of Huntington's Disease and a right leg amputation knee down.  Ems reports she has a lean to the right which isn't normal for her and she has a smell of UTI.  BP 110/70 HR 74 95% RA  AOX4 however slow to response. Lungs clear equal.

## 2023-12-16 NOTE — ED Notes (Signed)
 Pt attempted to urinate however had a bowel movement instead.

## 2023-12-16 NOTE — H&P (Addendum)
 History and Physical    Patient: Natasha Frederick FMW:[id number] DOB: July 13, 1955 DOA: 12/16/2023 DOS: the patient was seen and examined on 12/16/2023 PCP: Micheal Wolm ORN, MD  Patient coming from: Nursing home via EMS Chief Complaint:  Chief Complaint  Patient presents with   Weakness   fatigue   HPI: Natasha Frederick is a 68 y.o. female with medical history significant of Huntington disease, DVT,  s/p right BKA, endometriosis, arthritis, PUD with prior perforated gastric ulcer requiring Graham patch in 2020, and GERD presents with weakness, nausea, and jaundice.  History is difficult to obtain from patient.  Jaundice onset was noted this morning, prompting her visit. She also reported experiencing abdominal pain, primarily localized to the lower abdomen.  Denies having any fever or chills.  She has a history of knee amputation, though the reason for the procedure is unknown to her. No current pain or discomfort related to the amputation.  She is unable to open her fingers, possibly due to arthritis, which limits her hand function. The specific type of arthritis is not identified.  She uses a wheelchair for mobility.  On admission into the emergency department patient was noted to be afebrile with respirations 16-24, and all other vital signs maintained.  Labs revealed platelets 432, alkaline phosphatase 1181, AST 155, ALT 258, ammonia 83, total bilirubin 7.2, and acetaminophen  level undetectable.  Chest x-ray showed no acute abnormality.  Influenza, COVID-19, and RSV screening were negative.  CT scan of the head did not reveal any acute abnormality.  CT scan of the wrist with contrast noted severe intrahepatic and extrahepatic biliary dilation with moderately pancreatic duct dilatation and ill-defined low-density mass in the pancreatic neck, new extrinsic enous narrowing at the portal vein/SMV confluence without signs of thrombosis, and 5 mm subpleural right lower lobe nodule.  Gastroenterology had been  consulted.  Patient was given 500 mL of IV fluids.   Review of Systems: As mentioned in the history of present illness. All other systems reviewed and are negative. 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 difficile diarrhea    Endometriosis    Huntington disease (HCC)    Peptic ulcer    Perforated gastric ulcer (HCC)    Scoliosis    Past Surgical History:  Procedure Laterality Date   AMPUTATION Right 08/08/2018   Procedure: AMPUTATION BELOW KNEE RIGHT LOWER EXTREMITY;  Surgeon: Oris Krystal FALCON, MD;  Location: [facility] OR;  Service: Vascular;  Laterality: Right;   ANTERIOR LAT LUMBAR FUSION Left 01/05/2014   Procedure: LUMBAR TWO TO THREE, LUMBAR LUMBAR THREE TO FOUR, ANTERIOR LATERAL LUMBAR FUSION 2 LEVELS;  Surgeon: Darina MALVA Boehringer, MD;  Location: [facility];  Service: Neurosurgery;  Laterality: Left;   APPENDECTOMY  1963   BIOPSY  11/25/2018   Procedure: BIOPSY;  Surgeon: Legrand Victory LITTIE DOUGLAS, MD;  Location: [facility];  Service: Gastroenterology;;   BREAST SURGERY Left 1987   bx   COLONOSCOPY W/ POLYPECTOMY     ESOPHAGOGASTRODUODENOSCOPY (EGD) WITH PROPOFOL  N/A 11/25/2018   Procedure: ESOPHAGOGASTRODUODENOSCOPY (EGD) WITH PROPOFOL ;  Surgeon: Legrand Victory LITTIE DOUGLAS, MD;  Location: [facility];  Service: Gastroenterology;  Laterality: N/A;   EYE SURGERY Bilateral    FEMORAL-POPLITEAL BYPASS GRAFT Bilateral 08/06/2018   Procedure: BILATERAL POPLITEAL AND TIBIAL EMBOLECTOMIES, LEFT LEG FASCIOTOMY;  Surgeon: Oris Krystal FALCON, MD;  Location: [facility];  Service: Vascular;  Laterality: Bilateral;   LAPAROSCOPY N/A 07/30/2018   Procedure: DIAGNOSTIC LAPAROSCOPY, Omentopexy gram patch, Washout of intra-abdominal abcesses x 3;  Surgeon: Sheldon Standing, MD;  Location: [facility];  Service: General;  Laterality: N/A;   OOPHORECTOMY Left 1977   Planters wart Left 2005   radial keratoto Bilateral     TONSILLECTOMY  1958   TOTAL HIP ARTHROPLASTY Left 07/19/2014   Procedure: TOTAL HIP ARTHROPLASTY;  Surgeon: Dempsey Sensor, MD;  Location: [facility] OR;  Service: Orthopedics;  Laterality: Left;   TUBAL LIGATION  1986   Social History:  reports that she quit smoking about 29 years ago. Her smoking use included cigarettes. She started smoking about 37 years ago. She has a 8 pack-year smoking history. She has never used smokeless tobacco. She reports current alcohol use of about 3.0 standard drinks of alcohol per week. She reports that she does not use drugs.  Allergies  Allergen Reactions   Bee Venom Anaphylaxis and Swelling   Nsaids Other (See Comments)    PERFORATED ULCER = NO MORE NSAIDs   Adhesive [Tape] Other (See Comments)    redness   Codeine Nausea And Vomiting   Penicillins     Did it involve swelling of the face/tongue/throat, SOB, or low BP? Yes Did it involve sudden or severe rash/hives, skin peeling, or any reaction on the inside of your mouth or nose? Unknown Did you need to seek medical attention at a hospital or doctor's office? Yes When did it last happen?   age 60-40    If all above answers are "NO", may proceed with cephalosporin use.    Sulfa Antibiotics Itching    Family History  Problem Relation Age of Onset   Lung cancer Mother    Stroke Father    Huntington's disease Paternal Grandmother    Huntington's disease Paternal Aunt    Huntington's disease Paternal Uncle    Arthritis Neg Hx        family hx   Cancer Neg Hx        prostate ca -family hx   Heart disease Neg Hx        family hx   Colon cancer Neg Hx    Esophageal cancer Neg Hx    Pancreatic cancer Neg Hx    Liver disease Neg Hx     Prior to Admission medications   Medication Sig Start Date End Date Taking? Authorizing Provider  acetaminophen  (TYLENOL ) 500 MG tablet Take 2,000 mg by mouth in the morning.    [provider]  amantadine  (SYMMETREL ) 100 MG capsule Take 100 mg by mouth 2 (two)  times daily. 11/02/22   [provider]  fluorometholone (FML) 0.1 % ophthalmic suspension Place 1 drop into both eyes 2 (two) times daily. Patient not taking: Reported on 03/27/2023 01/09/23   [provider]  MYRBETRIQ  25 MG TB24 tablet Take 25 mg by mouth daily. 12/11/22   [provider]  nystatin  cream (MYCOSTATIN ) apply to affected rash twice daily as needed 05/03/23   Micheal Wolm ORN, MD  QUEtiapine  (SEROQUEL ) 25 MG tablet Take 1 tablet 2 times daily as needed for agitation 05/03/23   Burchette, Wolm ORN, MD  RESTASIS  0.05 % ophthalmic emulsion Place 1 drop into both eyes 2 (two) times daily. 02/12/23   [provider]  risperidone  (RISPERDAL ) 4 MG tablet Take 4 mg by mouth 2 (two) times daily.    [provider]  sertraline  (ZOLOFT ) 100 MG tablet TAKE 1 TABLET(100 MG) BY  MOUTH DAILY 05/08/23   Burchette, Wolm ORN, MD  Vibegron  (GEMTESA ) 75 MG TABS Take 1 tablet by mouth daily Patient taking differently: Take 75 mg by mouth at bedtime. Take 1 tablet by mouth daily 10/24/22   [place], Betty G, MD    Physical Exam: Vitals:   12/16/23 0840 12/16/23 0841 12/16/23 0845 12/16/23 0900  BP: 130/66  121/74 115/74  Pulse: 70  67 72  Resp: (!) 24  (!) 22 16  Temp:   98.1 F (36.7 C)   TempSrc:   Oral   SpO2: 99%  97% 99%  Weight:  47.6 kg    Height: 5' 5 (1.651 m) 5' 5 (1.651 m)     Constitutional: Elderly female currently laying in the hospital bed Eyes: PERRL, scleral icterus present. ENMT: Mucous membranes are moist.   Neck: normal, supple  Respiratory: clear to auscultation bilaterally, no wheezing, no crackles. Normal respiratory effort.   Cardiovascular: Regular rate and rhythm, no murmurs / rubs / gallops.   Abdomen: Epigastric tenderness to palpation.  Bowel sounds present in all 4 quadrants. Musculoskeletal: no clubbing / cyanosis.  Right below-knee amputation present.  Contractures of bilateral hands Skin: no rashes, lesions, ulcers. No  induration Neurologic: CN 2-12 grossly intact.  Choreiform movements present. Psychiatric: Some mild confusion noted she is alert and oriented to person and place, but states that she came from home when she has been at a nursing facility reportedly.  Data Reviewed:  EKG revealed sinus rhythm at 72 bpm.  Reviewed labs, imaging, and pertinent records as documented  Assessment and Plan:  Pancreatic mass Obstructive jaundice Elevated liver function studies Acute.  Patient presents with complaints of abdominal pain and found to have acute onset of jaundice.  Labs significant for alkaline phosphatase 1181, AST 155, ALT 258, ammonia 83, and total bilirubin 7.2.  CT scan of the abdomen and pelvis with contrast noted severe intrahepatic and extrahepatic biliary dilation with moderately pancreatic duct dilatation and ill-defined low-density mass in the pancreatic neck, new extrinsic enous narrowing at the portal vein/SMV confluence without signs of thrombosis, and 5 mm subpleural right lower lobe which could be possible metastases.  [facility] was consulted. - Admit to a medical telemetry bed - Aspiration precautions with elevation head of bed - Pured diet with thick liquids n.p.o. after midnight for ERCP in a.m. - Follow-up CA 19-9 - Continue normal saline IV fluids - Antiemetics as needed - Appreciate GI consultative services, will follow-up for any further recommendation  Right ischial tuberosity abscess Acute.  Addendum to CT of the abdomen and pelvis with contrast noted a 2.9 x 2.5 cm enhancing fluid collection concerning for abscess with no associated cortical destruction to suggest osteomyelitis. - IR consulted for possible drainage and culture  Huntington's disease - Delirium precautions - Continue amantadine   Thrombocytosis Acute on chronic.  Platelet count elevated at 432. - Continue to monitor  S/p right BKA  Peptic ulcer disease GERD Patient with a prior history of a  perforated gastric ulcer s/p Arlyss patch back in 2020.  Currently not on any PPI  DVT prophylaxis: Lovenox  Advance Care Planning:   Code Status: Full Code   Consults: [facility]  Family Communication: none  Severity of Illness: The appropriate patient status for this patient is INPATIENT. Inpatient status is judged to be reasonable and necessary in order to provide the required intensity of service to ensure the patient's safety. The patient's presenting symptoms, physical exam findings, and initial radiographic and laboratory  data in the context of their chronic comorbidities is felt to place them at high risk for further clinical deterioration. Furthermore, it is not anticipated that the patient will be medically stable for discharge from the hospital within 2 midnights of admission.   * I certify that at the point of admission it is my clinical judgment that the patient will require inpatient hospital care spanning beyond 2 midnights from the point of admission due to high intensity of service, high risk for further deterioration and high frequency of surveillance required.*  Author: Maximino DELENA Sharps, MD 12/16/2023 11:31 AM  For on call review [email or web address].

## 2023-12-16 NOTE — ED Notes (Signed)
 Pt asking for ice water. Pt has order for nectar thick liquids. Dietary called and asked for nectar thick water for the patient.

## 2023-12-16 NOTE — Consult Note (Addendum)
 Consultation  Referring Provider: ER MD/Gray Primary Care Physician:  Micheal Wolm ORN, MD Primary Gastroenterologist:  Dr.Danis  Reason for Consultation: New onset jaundice/abnormal imaging of pancreas  HPI: Natasha Frederick is a 68 y.o. female with history of Huntington's disease, currently residing at a skilled nursing facility, previous right BKA, prior perforated gastric ulcer requiring Arlyss patch remotely, and severe scoliosis who was brought to the emergency room with complaints of weakness and nausea present over the past couple of days.  Noted to be jaundiced this morning.  She has not had any vomiting.  Not aware of any fever or chills.  Difficult historian but says yes to complaint of recent abdominal pain.  Per ER notes mental status felt to be at her baseline per SNF/EMS. She also has prior history of DVTs, endometriosis, prior C. Difficile.  She had undergone EGD in 2020 for follow-up after Arlyss patch done in April 2024 gastric ulcer with perforation.  She was noted to have a widely patent Schatzki's ring, normal-appearing esophagus 1 nonbleeding superficial gastric ulcer at the incisura 4 mm, and otherwise negative exam.  Labs in the ER today with WBC 9.7/hemoglobin 12.5/hematocrit 38.2/lipase within normal limits Potassium 3.5/BUN 12/creatinine 0.44 T. bili 7.2/alk phos 1181/AST 155/ALT 258 Acetaminophen  level less than 10/CK 48 INR 1.0.  CT of the abdomen pelvis with contrast today shows new severe diffuse intrahepatic biliary dilation associated with moderate distention of the gallbladder common hepatic duct 2.3 cm focal narrowing of the common bile duct above the pancreatic head with suspected ductal wall enhancement, new moderate dilation of the pancreatic duct within the pancreatic tail and an ill-defined low-density mass within the pancreatic neck measuring 1.6 x 1.3 cm highly concerning for pancreatic malignancy with associated double duct sign.  There is new contour  irregularity and nodularity of the spleen without focally suspicious parenchymal lesion, also noted new extrinsic venous narrowing at the portal vein/SMV confluence without evidence of acute thrombosis worrisome for tumor unresectability 1 new 5 mm subpleural right lower lobe pulmonary nodule potentially a metastasis.   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 difficile diarrhea    Endometriosis    Huntington disease (HCC)    Peptic ulcer    Perforated gastric ulcer (HCC)    Scoliosis     Past Surgical History:  Procedure Laterality Date   AMPUTATION Right 08/08/2018   Procedure: AMPUTATION BELOW KNEE RIGHT LOWER EXTREMITY;  Surgeon: Oris Krystal FALCON, MD;  Location: [facility] OR;  Service: Vascular;  Laterality: Right;   ANTERIOR LAT LUMBAR FUSION Left 01/05/2014   Procedure: LUMBAR TWO TO THREE, LUMBAR LUMBAR THREE TO FOUR, ANTERIOR LATERAL LUMBAR FUSION 2 LEVELS;  Surgeon: Darina MALVA Boehringer, MD;  Location: [facility];  Service: Neurosurgery;  Laterality: Left;   APPENDECTOMY  1963   BIOPSY  11/25/2018   Procedure: BIOPSY;  Surgeon: Legrand Victory LITTIE DOUGLAS, MD;  Location: [facility];  Service: Gastroenterology;;   BREAST SURGERY Left 1987   bx   COLONOSCOPY W/ POLYPECTOMY     ESOPHAGOGASTRODUODENOSCOPY (EGD) WITH PROPOFOL  N/A 11/25/2018   Procedure: ESOPHAGOGASTRODUODENOSCOPY (EGD) WITH PROPOFOL ;  Surgeon: Legrand Victory LITTIE DOUGLAS, MD;  Location: [facility];  Service: Gastroenterology;  Laterality: N/A;   EYE SURGERY Bilateral    FEMORAL-POPLITEAL BYPASS GRAFT Bilateral 08/06/2018   Procedure: BILATERAL POPLITEAL AND TIBIAL EMBOLECTOMIES, LEFT LEG FASCIOTOMY;  Surgeon: Oris Krystal FALCON, MD;  Location: [facility] OR;  Service: Vascular;  Laterality: Bilateral;   LAPAROSCOPY N/A 07/30/2018   Procedure: DIAGNOSTIC LAPAROSCOPY, Omentopexy gram patch, Washout of intra-abdominal abcesses x 3;  Surgeon: Sheldon Standing,  MD;  Location: [facility] ORS;  Service: General;  Laterality: N/A;   OOPHORECTOMY Left 1977   Planters wart Left 2005   radial keratoto Bilateral    TONSILLECTOMY  1958   TOTAL HIP ARTHROPLASTY Left 07/19/2014   Procedure: TOTAL HIP ARTHROPLASTY;  Surgeon: Dempsey Sensor, MD;  Location: [facility] OR;  Service: Orthopedics;  Laterality: Left;   TUBAL LIGATION  1986    Prior to Admission medications   Medication Sig Start Date End Date Taking? Authorizing Provider  acetaminophen  (TYLENOL ) 500 MG tablet Take 2,000 mg by mouth in the morning.    [provider]  amantadine  (SYMMETREL ) 100 MG capsule Take 100 mg by mouth 2 (two) times daily. 11/02/22   [provider]  fluorometholone (FML) 0.1 % ophthalmic suspension Place 1 drop into both eyes 2 (two) times daily. Patient not taking: Reported on 03/27/2023 01/09/23   [provider]  MYRBETRIQ  25 MG TB24 tablet Take 25 mg by mouth daily. 12/11/22   [provider]  nystatin  cream (MYCOSTATIN ) apply to affected rash twice daily as needed 05/03/23   Micheal Wolm ORN, MD  QUEtiapine  (SEROQUEL ) 25 MG tablet Take 1 tablet 2 times daily as needed for agitation 05/03/23   Burchette, Wolm ORN, MD  RESTASIS  0.05 % ophthalmic emulsion Place 1 drop into both eyes 2 (two) times daily. 02/12/23   [provider]  risperidone  (RISPERDAL ) 4 MG tablet Take 4 mg by mouth 2 (two) times daily.    [provider]  sertraline  (ZOLOFT ) 100 MG tablet TAKE 1 TABLET(100 MG) BY MOUTH DAILY 05/08/23   Burchette, Wolm ORN, MD  Vibegron  (GEMTESA ) 75 MG TABS Take 1 tablet by mouth daily Patient taking differently: Take 75 mg by mouth at bedtime. Take 1 tablet by mouth daily 10/24/22   [place], Betty G, MD    No current facility-administered medications for this encounter.   Current Outpatient Medications  Medication Sig Dispense Refill   acetaminophen  (TYLENOL ) 500 MG tablet Take 2,000 mg by mouth in the morning.     amantadine  (SYMMETREL )  100 MG capsule Take 100 mg by mouth 2 (two) times daily.     fluorometholone (FML) 0.1 % ophthalmic suspension Place 1 drop into both eyes 2 (two) times daily. (Patient not taking: Reported on 03/27/2023)     MYRBETRIQ  25 MG TB24 tablet Take 25 mg by mouth daily.     nystatin  cream (MYCOSTATIN ) apply to affected rash twice daily as needed 30 g 0   QUEtiapine  (SEROQUEL ) 25 MG tablet Take 1 tablet 2 times daily as needed for agitation 60 tablet 2   RESTASIS  0.05 % ophthalmic emulsion Place 1 drop into both eyes 2 (two) times daily.     risperidone  (RISPERDAL ) 4 MG tablet Take 4 mg by mouth 2 (two) times daily.     sertraline  (ZOLOFT ) 100 MG tablet TAKE 1 TABLET(100 MG) BY MOUTH DAILY 30 tablet 0   Vibegron  (GEMTESA ) 75 MG TABS Take 1 tablet by mouth daily (Patient taking differently: Take 75 mg by mouth at bedtime. Take 1 tablet by mouth daily) 90 tablet 1    Allergies as of 12/16/2023 - Review Complete 12/16/2023  Allergen Reaction Noted   Bee venom Anaphylaxis and Swelling 12/25/2013   Nsaids Other (See Comments)  07/31/2018   Adhesive [tape] Other (See Comments) 07/09/2014   Codeine Nausea And Vomiting 01/20/2015   Penicillins  01/20/2015   Sulfa antibiotics Itching 01/20/2015    Family History  Problem Relation Age of Onset   Lung cancer Mother    Stroke Father    Huntington's disease Paternal Grandmother    Huntington's disease Paternal Aunt    Huntington's disease Paternal Uncle    Arthritis Neg Hx        family hx   Cancer Neg Hx        prostate ca -family hx   Heart disease Neg Hx        family hx   Colon cancer Neg Hx    Esophageal cancer Neg Hx    Pancreatic cancer Neg Hx    Liver disease Neg Hx     Social History   Socioeconomic History   Marital status: Married    Spouse name: Not on file   Number of children: Not on file   Years of education: Not on file   Highest education level: Not on file  Occupational History   Occupation: unemployed    Comment:  disabled back pain  Tobacco Use   Smoking status: Former    Current packs/day: 0.00    Average packs/day: 1 pack/day for 8.0 years (8.0 ttl pk-yrs)    Types: Cigarettes    Start date: 01/17/1986    Quit date: 01/17/1994    Years since quitting: 29.9   Smokeless tobacco: Never  Vaping Use   Vaping status: Never Used  Substance and Sexual Activity   Alcohol use: Yes    Alcohol/week: 3.0 standard drinks of alcohol    Types: 3 Cans of beer per week    Comment: per week   Drug use: No   Sexual activity: Not on file  Other Topics Concern   Not on file  Social History Narrative   Married   Right BKA    Disabled due to back problems       Social Drivers of Health   Financial Resource Strain: Low Risk  (09/21/2021)   Overall Financial Resource Strain (CARDIA)    Difficulty of Paying Living Expenses: Not hard at all  Food Insecurity: No Food Insecurity (09/26/2022)   Hunger Vital Sign    Worried About Running Out of Food in the Last Year: Never true    Ran Out of Food in the Last Year: Never true  Transportation Needs: No Transportation Needs (09/26/2022)   PRAPARE - Administrator, Civil Service (Medical): No    Lack of Transportation (Non-Medical): No  Physical Activity: Insufficiently Active (09/26/2022)   Exercise Vital Sign    Days of Exercise per Week: 2 days    Minutes of Exercise per Session: 60 min  Stress: No Stress Concern Present (09/26/2022)   [employer] of Occupational Health - Occupational Stress Questionnaire    Feeling of Stress : Not at all  Social Connections: Moderately Isolated (09/26/2022)   Social Connection and Isolation Panel    Frequency of Communication with Friends and Family: More than three times a week    Frequency of Social Gatherings with Friends and Family: More than three times a week    Attends Religious Services: Never    Database administrator or Organizations: No    Attends Banker Meetings: Never    Marital  Status: Married  Catering manager Violence: Not At Risk (09/26/2022)   Humiliation, Afraid,  Rape, and Kick questionnaire    Fear of Current or Ex-Partner: No    Emotionally Abused: No    Physically Abused: No    Sexually Abused: No    Review of Systems: Pertinent positive and negative review of systems were noted in the above HPI section.  All other review of systems was otherwise negative.   Physical Exam: Vital signs in last 24 hours: Temp:  [98.1 F (36.7 C)] 98.1 F (36.7 C) (09/08 1232) Pulse Rate:  [67-72] 69 (09/08 1230) Resp:  [16-24] 19 (09/08 1230) BP: (115-142)/(66-82) 142/82 (09/08 1230) SpO2:  [97 %-99 %] 98 % (09/08 1230) Weight:  [47.6 kg] 47.6 kg (09/08 0841)   General:   Alert,  Well-developed, thin, frail older white female  cooperative in NAD, curled up with knees to chest, movement disorder Head:  Normocephalic and atraumatic. Eyes:  Sclera icteric, conjunctiva pink. Ears:  Normal auditory acuity. Nose:  No deformity, discharge,  or lesions. Mouth:  No deformity or lesions.   Neck:  Supple; no masses or thyromegaly. Lungs:  Clear throughout to auscultation.   No wheezes, crackles, or rhonchi. Heart:  Regular rate and rhythm; no murmurs, clicks, rubs,  or gallops. Abdomen:  Soft, some mild tenderness in the epigastrium, no guarding or rebound ,BS active,nonpalp mass or hsm.   Rectal: Not done Msk:  Symmetrical without gross deformities. . Pulses:  Normal pulses noted. Extremities:  Without clubbing or edema. Neurologic:  Alert and  oriented x2,. Skin:  Intact without significant lesions or rashes.. Psych:  Alert and cooperative. Normal mood and affect.  Intake/Output from previous day: No intake/output data recorded. Intake/Output this shift: No intake/output data recorded.  Lab Results: Recent Labs    12/16/23 0842 12/16/23 0913  WBC 9.7  --   HGB 12.5 14.3  HCT 38.2 42.0  PLT 432*  --    BMET Recent Labs    12/16/23 0842 12/16/23 0913   NA 139 141  K 3.5 3.5  CL 104 106  CO2 21*  --   GLUCOSE 126* 123*  BUN 12 14  CREATININE 0.44 0.40*  CALCIUM  9.9  --    LFT Recent Labs    12/16/23 0842  PROT 6.7  ALBUMIN  3.0*  AST 155*  ALT 258*  ALKPHOS 1,181*  BILITOT 7.2*   PT/INR Recent Labs    12/16/23 0842  LABPROT 13.7  INR 1.0       IMPRESSION:  #44 68 year old female with Huntington's disease, complicated by neurogenic dysphagia (on pured diet with thickened liquids), and mild cognitive impairment per previous notes he was brought to the emergency room with complaints of weakness and fatigue and new onset jaundice noticed within the past 24 hours  Lab workup here revealed T. bili of 7.2/alk phos 1181/AST 155/ALT 258 CBC unremarkable CT abdomen pelvis shows new severe intrahepatic and extrahepatic biliary ductal dilation with moderate pancreatic ductal dilation and an ill-defined low-density mass in the pancreatic neck concerning for pancreatic malignancy.  There is also some new extrinsic venous narrowing at the portal vein/SMV confluence worrisome for tumor unresectability, new contour irregularity and nodularity of the spleen without focally suspicious lesion and a 5 mm subpleural right lower lobe nodule possible metastasis  #2 status post right BKA #3 previous history of perforated gastric ulcer 2020 status post Arlyss patch #4 previous DVT  #5 CT abdomen addendum with mention of a possible abscess near the right ischial tuberosity measuring 2.9 x 2.5 cm  Plan; pured diet with  thick liquids, n.p.o. past midnight CA 19-9 Patient will be scheduled for ERCP with brushings and stent placement with Dr. Avram for tomorrow 12/17/2023.  I discussed the procedure in detail with the patient including indications risks and benefits.  I am not certain she can consent for herself, will also discuss with her husband for consent.  Will need to discuss further management of the possible abscess near the right ischial  tuberosity-she has antibiotic allergies which will need to be considered.      Amy EsterwoodPA-C  12/16/2023, 1:41 PM     [facility] Attending   I have taken an interval history, reviewed the chart and examined the patient. I agree with the Advanced Practitioner's note, impression and recommendations with the following additions:  I have also reviewed imaging.  Patient with Huntington's chorea and chronic cognitive impairment, now with pancreatic mass in the neck causing biliary and pancreatic duct dilation, concerning for carcinoma the pancreas.  I explained this to the patient and her husband who was present.  I have reviewed the risks benefits and indications of ERCP.  I have specifically mention the possibility of causing pancreatitis, injury requiring surgery, anesthesia complications, bleeding and the other typical risks of endoscopic procedures.   Plan for ERCP with bile duct brushings and stent placement tomorrow.  Will consent husband given patient's cognitive impairment.  Notes she has hyperammonemia but I do not think this is hepatic encephalopathy.  If that is nondiagnostic then EUS with FNA should be considered.  Await CA 19-9 also.  At this point based upon CT findings does not appear to be a surgical candidate and I think that would be highly unlikely anyway given her overall situation.  Possible abscess near right ischial tuberosity-defer to medicine service.  Could need aspiration from IR if possible.    Lupita CHARLENA Avram, MD, [facility] See TRACEY on call - gastroenterology for best contact person 12/16/2023 3:08 PM

## 2023-12-17 ENCOUNTER — Encounter (HOSPITAL_COMMUNITY)
Admission: EM | Disposition: A | Discharge status: Skilled Nursing Facility | Payer: Self-pay | Source: Skilled Nursing Facility | Attending: Internal Medicine

## 2023-12-17 ENCOUNTER — Inpatient Hospital Stay (HOSPITAL_COMMUNITY): Payer: Self-pay | Admitting: Anesthesiology

## 2023-12-17 ENCOUNTER — Encounter (HOSPITAL_COMMUNITY): Payer: Self-pay | Admitting: Internal Medicine

## 2023-12-17 ENCOUNTER — Inpatient Hospital Stay (HOSPITAL_COMMUNITY)

## 2023-12-17 DIAGNOSIS — K2980 Duodenitis without bleeding: Secondary | ICD-10-CM

## 2023-12-17 DIAGNOSIS — K269 Duodenal ulcer, unspecified as acute or chronic, without hemorrhage or perforation: Secondary | ICD-10-CM

## 2023-12-17 DIAGNOSIS — G10 Huntington's disease: Secondary | ICD-10-CM | POA: Diagnosis not present

## 2023-12-17 DIAGNOSIS — K2100 Gastro-esophageal reflux disease with esophagitis, without bleeding: Secondary | ICD-10-CM

## 2023-12-17 DIAGNOSIS — Z515 Encounter for palliative care: Secondary | ICD-10-CM

## 2023-12-17 DIAGNOSIS — F418 Other specified anxiety disorders: Secondary | ICD-10-CM | POA: Diagnosis not present

## 2023-12-17 DIAGNOSIS — K8689 Other specified diseases of pancreas: Secondary | ICD-10-CM

## 2023-12-17 DIAGNOSIS — Z7189 Other specified counseling: Secondary | ICD-10-CM | POA: Diagnosis not present

## 2023-12-17 DIAGNOSIS — K831 Obstruction of bile duct: Secondary | ICD-10-CM

## 2023-12-17 DIAGNOSIS — K3189 Other diseases of stomach and duodenum: Secondary | ICD-10-CM | POA: Diagnosis not present

## 2023-12-17 DIAGNOSIS — C240 Malignant neoplasm of extrahepatic bile duct: Secondary | ICD-10-CM | POA: Diagnosis not present

## 2023-12-17 DIAGNOSIS — K838 Other specified diseases of biliary tract: Secondary | ICD-10-CM

## 2023-12-17 HISTORY — PX: ERCP: SHX5425

## 2023-12-17 HISTORY — PX: BILIARY BRUSHING: SHX6843

## 2023-12-17 HISTORY — PX: BILIARY STENT PLACEMENT: SHX5538

## 2023-12-17 HISTORY — PX: ESOPHAGOGASTRODUODENOSCOPY: SHX5428

## 2023-12-17 LAB — CBC
HCT: 38.9 % (ref 36.0–46.0)
HCT: 33.7 % — ABNORMAL LOW (ref 36.0–46.0)
Hemoglobin: 12.8 g/dL (ref 12.0–15.0)
Hemoglobin: 11.1 g/dL — ABNORMAL LOW (ref 12.0–15.0)
MCH: 28.9 pg (ref 26.0–34.0)
MCH: 29.2 pg (ref 26.0–34.0)
MCHC: 32.9 g/dL (ref 30.0–36.0)
MCHC: 32.9 g/dL (ref 30.0–36.0)
MCV: 87.8 fL (ref 80.0–100.0)
MCV: 88.7 fL (ref 80.0–100.0)
Platelets: 469 K/uL — ABNORMAL HIGH (ref 150–400)
Platelets: 430 K/uL — ABNORMAL HIGH (ref 150–400)
RBC: 4.43 MIL/uL (ref 3.87–5.11)
RBC: 3.8 MIL/uL — ABNORMAL LOW (ref 3.87–5.11)
RDW: 16.6 % — ABNORMAL HIGH (ref 11.5–15.5)
RDW: 16.8 % — ABNORMAL HIGH (ref 11.5–15.5)
WBC: 11.8 K/uL — ABNORMAL HIGH (ref 4.0–10.5)
WBC: 12.5 K/uL — ABNORMAL HIGH (ref 4.0–10.5)
nRBC: 0 % (ref 0.0–0.2)
nRBC: 0 % (ref 0.0–0.2)

## 2023-12-17 LAB — COMPREHENSIVE METABOLIC PANEL WITH GFR
ALT: 239 U/L — ABNORMAL HIGH (ref 0–44)
AST: 150 U/L — ABNORMAL HIGH (ref 15–41)
Albumin: 3 g/dL — ABNORMAL LOW (ref 3.5–5.0)
Alkaline Phosphatase: 1155 U/L — ABNORMAL HIGH (ref 38–126)
Anion gap: 12 (ref 5–15)
BUN: 7 mg/dL — ABNORMAL LOW (ref 8–23)
CO2: 23 mmol/L (ref 22–32)
Calcium: 9.5 mg/dL (ref 8.9–10.3)
Chloride: 104 mmol/L (ref 98–111)
Creatinine, Ser: 0.44 mg/dL (ref 0.44–1.00)
GFR, Estimated: >60 mL/min (ref >60)
Glucose, Bld: 106 mg/dL — ABNORMAL HIGH (ref 70–99)
Potassium: 3.5 mmol/L (ref 3.5–5.1)
Sodium: 139 mmol/L (ref 135–145)
Total Bilirubin: 10.8 mg/dL — ABNORMAL HIGH (ref 0.0–1.2)
Total Protein: 6.4 g/dL — ABNORMAL LOW (ref 6.5–8.1)

## 2023-12-17 LAB — PROCALCITONIN: Procalcitonin: 0.16 ng/mL

## 2023-12-17 LAB — TYPE AND SCREEN
ABO/RH(D): A POS
Antibody Screen: NEGATIVE

## 2023-12-17 LAB — C-REACTIVE PROTEIN: CRP: 8.4 mg/dL — ABNORMAL HIGH (ref <1.0)

## 2023-12-17 LAB — PROTIME-INR
INR: 1 (ref 0.8–1.2)
Prothrombin Time: 13.8 s (ref 11.4–15.2)

## 2023-12-17 LAB — MRSA NEXT GEN BY PCR, NASAL: MRSA by PCR Next Gen: NOT DETECTED

## 2023-12-17 LAB — ABO/RH: ABO/RH(D): A POS

## 2023-12-17 SURGERY — ERCP, WITH INTERVENTION IF INDICATED
Anesthesia: General

## 2023-12-17 MED ORDER — PHENYLEPHRINE HCL-NACL 20-0.9 MG/250ML-% IV SOLN
INTRAVENOUS | Status: DC | PRN
  Administered 2023-12-17: 25 ug/min via INTRAVENOUS

## 2023-12-17 MED ORDER — SODIUM CHLORIDE 0.9 % IV SOLN
INTRAVENOUS | Status: DC | PRN
  Administered 2023-12-17: 30 mL

## 2023-12-17 MED ORDER — ONDANSETRON HCL 4 MG/2ML IJ SOLN
INTRAMUSCULAR | Status: DC | PRN
  Administered 2023-12-17: 4 mg via INTRAVENOUS

## 2023-12-17 MED ORDER — GLUCAGON HCL RDNA (DIAGNOSTIC) 1 MG IJ SOLR
INTRAMUSCULAR | Status: AC
  Filled 2023-12-17: qty 2

## 2023-12-17 MED ORDER — CIPROFLOXACIN IN D5W 400 MG/200ML IV SOLN: 400.0000 mg | Freq: Once | INTRAVENOUS | Status: DC

## 2023-12-17 MED ORDER — METRONIDAZOLE 500 MG/100ML IV SOLN
500.0000 mg | Freq: Two times a day (BID) | INTRAVENOUS | Status: DC
Start: 2023-12-17 — End: 2023-12-19
  Administered 2023-12-17 – 2023-12-19 (×4): 500 mg via INTRAVENOUS
  Filled 2023-12-17 (×4): qty 100

## 2023-12-17 MED ORDER — SODIUM CHLORIDE 0.9 % IV SOLN: INTRAVENOUS | Status: AC

## 2023-12-17 MED ORDER — SUGAMMADEX SODIUM 200 MG/2ML IV SOLN
INTRAVENOUS | Status: DC | PRN
  Administered 2023-12-17: 200 mg via INTRAVENOUS

## 2023-12-17 MED ORDER — DICLOFENAC SUPPOSITORY 100 MG
RECTAL | Status: AC
Start: 2023-12-17 — End: 2023-12-17
  Filled 2023-12-17: qty 1

## 2023-12-17 MED ORDER — DEXMEDETOMIDINE HCL IN NACL 80 MCG/20ML IV SOLN
INTRAVENOUS | Status: DC | PRN
  Administered 2023-12-17: 8 ug via INTRAVENOUS

## 2023-12-17 MED ORDER — LIDOCAINE 2% (20 MG/ML) 5 ML SYRINGE
INTRAMUSCULAR | Status: DC | PRN
  Administered 2023-12-17: 60 mg via INTRAVENOUS

## 2023-12-17 MED ORDER — DICLOFENAC SUPPOSITORY 100 MG
RECTAL | Status: DC | PRN
Start: 2023-12-17 — End: 2023-12-17
  Administered 2023-12-17: 100 mg via RECTAL

## 2023-12-17 MED ORDER — PROPOFOL 500 MG/50ML IV EMUL
INTRAVENOUS | Status: DC | PRN
  Administered 2023-12-17: 100 ug/kg/min via INTRAVENOUS

## 2023-12-17 MED ORDER — FENTANYL CITRATE (PF) 100 MCG/2ML IJ SOLN
INTRAMUSCULAR | Status: AC
  Filled 2023-12-17: qty 2

## 2023-12-17 MED ORDER — VANCOMYCIN HCL 750 MG/150ML IV SOLN
750.0000 mg | INTRAVENOUS | Status: DC
  Administered 2023-12-17 – 2023-12-18 (×2): 750 mg via INTRAVENOUS
  Filled 2023-12-17 (×3): qty 150

## 2023-12-17 MED ORDER — LACTATED RINGERS IV SOLN
INTRAVENOUS | Status: AC | PRN
  Administered 2023-12-17: 1000 mL via INTRAVENOUS

## 2023-12-17 MED ORDER — FENTANYL CITRATE (PF) 250 MCG/5ML IJ SOLN
INTRAMUSCULAR | Status: DC | PRN
  Administered 2023-12-17: 100 ug via INTRAVENOUS

## 2023-12-17 MED ORDER — PANTOPRAZOLE SODIUM 40 MG IV SOLR
40.0000 mg | Freq: Two times a day (BID) | INTRAVENOUS | Status: DC
  Administered 2023-12-17 – 2023-12-19 (×5): 40 mg via INTRAVENOUS
  Filled 2023-12-17 (×6): qty 10

## 2023-12-17 MED ORDER — EPHEDRINE SULFATE-NACL 50-0.9 MG/10ML-% IV SOSY
PREFILLED_SYRINGE | INTRAVENOUS | Status: DC | PRN
  Administered 2023-12-17: 5 mg via INTRAVENOUS
  Administered 2023-12-17: 10 mg via INTRAVENOUS

## 2023-12-17 MED ORDER — DICLOFENAC SUPPOSITORY 100 MG: 100.0000 mg | Freq: Once | RECTAL | Status: AC

## 2023-12-17 MED ORDER — PHENYLEPHRINE 80 MCG/ML (10ML) SYRINGE FOR IV PUSH (FOR BLOOD PRESSURE SUPPORT)
PREFILLED_SYRINGE | INTRAVENOUS | Status: DC | PRN
  Administered 2023-12-17 (×4): 160 ug via INTRAVENOUS

## 2023-12-17 MED ORDER — SODIUM CHLORIDE 0.9 % IV SOLN
2.0000 g | Freq: Two times a day (BID) | INTRAVENOUS | Status: DC
  Administered 2023-12-17 – 2023-12-18 (×4): 2 g via INTRAVENOUS
  Filled 2023-12-17 (×5): qty 12.5

## 2023-12-17 MED ORDER — DEXAMETHASONE SODIUM PHOSPHATE 10 MG/ML IJ SOLN
INTRAMUSCULAR | Status: DC | PRN
  Administered 2023-12-17: 10 mg via INTRAVENOUS

## 2023-12-17 MED ORDER — ROCURONIUM BROMIDE 10 MG/ML (PF) SYRINGE
PREFILLED_SYRINGE | INTRAVENOUS | Status: DC | PRN
  Administered 2023-12-17: 50 mg via INTRAVENOUS

## 2023-12-17 MED ORDER — PROPOFOL 10 MG/ML IV BOLUS
INTRAVENOUS | Status: DC | PRN
Start: 2023-12-17 — End: 2023-12-17
  Administered 2023-12-17: 100 mg via INTRAVENOUS

## 2023-12-17 NOTE — Op Note (Signed)
 [facility] Patient Name: Natasha Frederick Procedure Date : 12/17/2023 MRN: [id number] Attending MD: Lupita FORBES Commander , MD, [id number] Date of Birth: 17-Nov-1955 CSN: [id number] Age: 68 Admit Type: Inpatient Procedure:                ERCP Indications:              Jaundice, Tumor of the genu of pancreas -CT scan                            reveals pancreatic neck mass with common bile duct                            dilation and diffuse intrahepatic dilation as well.                            Distal pancreatic duct is dilated. Providers:                Lupita CHARLENA Commander, MD, Ozell Pouch, Lorrayne Kitty,                            Technician Referring MD:              Medicines:                General Anesthesia, Patient is on cefepime ,                            vancomycin  and metronidazole  on the floor. She                            received diclofenac  100 mg per rectum for post ERCP                            pancreatitis prophylaxis. Complications:            No immediate complications. Estimated Blood Loss:     Estimated blood loss was minimal. Procedure:                Pre-Anesthesia Assessment:                           - Prior to the procedure, a History and Physical                            was performed, and patient medications and                            allergies were reviewed. The patient's tolerance of                            previous anesthesia was also reviewed. The risks                            and benefits of the procedure and the sedation  options and risks were discussed with the patient.                            All questions were answered, and informed consent                            was obtained. Prior Anticoagulants: The patient                            last took Lovenox  (enoxaparin ) 1 day prior to the                            procedure. ASA Grade Assessment: III - A patient                            with  severe systemic disease. After reviewing the                            risks and benefits, the patient was deemed in                            satisfactory condition to undergo the procedure.                           After obtaining informed consent, the scope was                            passed under direct vision. Throughout the                            procedure, the patient's blood pressure, pulse, and                            oxygen saturations were monitored continuously. The                            [employer] D single use                            duodenoscope was introduced through the mouth, and                            used to inject contrast into and used to cannulate                            the bile duct. The [id number]-[id number] ([id number]) Olympus                            duodenoscope was introduced through the mouth, and                            used to inject contrast into and used to inject  contrast into the bile duct. The [id number]-H190 ([id number])                            Olympus endoscope was introduced through the and                            used to evaluate the upper GI tract due to the                            initial difficulty passing into the duodenum with                            the Exalt scope.. The ERCP was somewhat difficult                            due to J-shaped stomach and spinal hardware                            obscuring views. Successful completion of the                            procedure was aided by changing from Exalt scope to                            Olympus duodenoscope and altering position of                            patient and radiology boom. The patient tolerated                            the procedure well. Scope In: Scope Out: Findings:      A scout film of the abdomen was obtained. Spinal hardware implants were       seen. Once I was able to get into position with the  Olympus duodenoscope       I was able to perform the procedure. Prior to that with the gastroscope       I was available to evaluate the esophagus stomach and duodenum. There is       reflux esophagitis grade B seen in the distal esophagus. The stomach was       a markedly J-shaped stomach which was the reason for the difficulty with       the exalt scope as it is stiff. There were multiple duodenal erosions       seen. The papilla was seen and intact no bile was draining. There was a       lot of edema around the papilla. I was able to cannulate the papilla       without difficulty using a wire-guided approach (Hydratome Rx 44 kit       with 035 Hydrajagwire) and passed into the proximal biliary tree area.       Contrast injection was performed and delineation of the bile ducts was       suboptimal due to the massive dilation. Marked proximal upstream       dilation of the common bile duct and the intrahepatic  ducts. I do ot       think the gallbladder filled. There was distorted anatomy due to her       scoliosis and the spinal hardware overlaid the distal bile duct. We       changed the position of the boom on the radiology tower and moved the       patient and manipulated the scope but it was still difficult to get good       images there. Contrast did not delineate the distal bile duct for       several centimeters approximately 3, and I never really confidently saw       that area though there must be a stricture there. I did not perform a       sphincterotomy because we had good access and I could not adequately       delineate the distal bile duct. Balloon was inserted and inflated to try       to fill out the more proximal ducts and that was somewhat successful       though the proximal common bile duct and common hepatic duct were still       difficult to delineate due to the massive dilation and wafting of       contrast. The balloon was withdrawn and impacted about 3 cm from the        papilla I thought. This indicated the upper limit of the stricture. That       was removed and then I performed 2 cytology brushings of the bile duct.       I then placed a 10 French 7 cm plastic biliary stent with good result       and good drainage of bile and contrast. I did have to change to the wire       to a different wire as the original wire would not pass through the       stent apparatus. No pancreatogram by intent. I then took biopsies of the       duodenal erosions. Specimen submitted to pathology. Procedure       terminated. I personally interpreted the radiology images. 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                            Approximately 3 cm distal common bile duct                            stricture. Marked upstream diffuse biliary                            dilation. The stricture was malignant appearing.                            This was sampled by brushing for cytology x 2. This                            stricture  was treated with stent placement. 10                            French 7 cm plastic stent.                           - Duodenitis. Biopsied.                           - Markedly J-shaped stomach requiring                           - LA Grade B reflux esophagitis.                           - Markedly J-shaped stomach requiring use of the                            Olympus duodenoscope Recommendation:           - Returned patient to floor                           Clear liquid (nectar thick) diet today reassess                            tomorrow for advancing if possible.                           Follow-up LFTs in AM.                           Await cytologies and biopsies.                           Await CA 19?9                           I reviewed results with husband by phone after the                             procedure. Procedure Code(s):        --- Professional ---                           [phone], Endoscopic retrograde                            cholangiopancreatography (ERCP); with placement of                            endoscopic stent into biliary or pancreatic duct,                            including pre- and post-dilation and guide wire                            passage, when performed, including sphincterotomy,  when performed, each stent                           43239, Esophagogastroduodenoscopy, flexible,                            transoral; with biopsy, single or multiple                           74328, Endoscopic catheterization of the biliary                            ductal system, radiological supervision and                            interpretation Diagnosis Code(s):        --- Professional ---                           K83.1, Obstruction of bile duct                           K29.80, Duodenitis without bleeding                           K21.00, Gastro-esophageal reflux disease with                            esophagitis, without bleeding                           R17, Unspecified jaundice                           D49.0, Neoplasm of unspecified behavior of                            digestive system CPT copyright 2022 American Medical Association. All rights reserved. The codes documented in this report are preliminary and upon coder review may  be revised to meet current compliance requirements. Lupita FORBES Commander, MD 12/17/2023 2:43:29 PM This report has been signed electronically. Number of Addenda: 0

## 2023-12-17 NOTE — Progress Notes (Signed)
 Patient is confused and intermittently restless. Pulled out her purewick twice. Bed change done. Placed with safety mitts.

## 2023-12-17 NOTE — Consult Note (Shared)
 Chief Complaint: Patient was seen in consultation today for  Chief Complaint  Patient presents with   Weakness   fatigue   Referring Physician(s): Dr. Avram   Supervising Physician: Johann Sieving  Patient Status: [facility] - In-pt  History of Present Illness: Natasha Frederick is a 68 y.o. female with a medical history significant for Huntington's disease, DVT, right BKA, perforated gastric ulcer s/p Arlyss patch (2020) and scoliosis. She was admitted to the hospital 12/16/23 with weakness, nausea and jaundice. Work up revealed a pancreatic mass with obstructive jaundice and possible lung metastases. She underwent ERCP today with stent placement into the common bile duct.   Her imaging also showed a right ischial tuberosity abscess and IR has been requested to aspirate this collection for antibiotic selection. Imaging reviewed and procedure approved by Dr. Philip.     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 difficile diarrhea    Endometriosis    Huntington disease (HCC)    Peptic ulcer    Perforated gastric ulcer (HCC)    Scoliosis     Past Surgical History:  Procedure Laterality Date   AMPUTATION Right 08/08/2018   Procedure: AMPUTATION BELOW KNEE RIGHT LOWER EXTREMITY;  Surgeon: Oris Krystal FALCON, MD;  Location: [facility] OR;  Service: Vascular;  Laterality: Right;   ANTERIOR LAT LUMBAR FUSION Left 01/05/2014   Procedure: LUMBAR TWO TO THREE, LUMBAR LUMBAR THREE TO FOUR, ANTERIOR LATERAL LUMBAR FUSION 2 LEVELS;  Surgeon: Darina MALVA Boehringer, MD;  Location: [facility] NEURO ORS;  Service: Neurosurgery;  Laterality: Left;   APPENDECTOMY  1963   BIOPSY  11/25/2018   Procedure: BIOPSY;  Surgeon: Legrand Victory LITTIE DOUGLAS, MD;  Location: [facility] ENDOSCOPY;  Service: Gastroenterology;;   BREAST SURGERY Left 1987   bx   COLONOSCOPY W/ POLYPECTOMY     ESOPHAGOGASTRODUODENOSCOPY (EGD) WITH PROPOFOL  N/A 11/25/2018    Procedure: ESOPHAGOGASTRODUODENOSCOPY (EGD) WITH PROPOFOL ;  Surgeon: Legrand Victory LITTIE DOUGLAS, MD;  Location: [facility] ENDOSCOPY;  Service: Gastroenterology;  Laterality: N/A;   EYE SURGERY Bilateral    FEMORAL-POPLITEAL BYPASS GRAFT Bilateral 08/06/2018   Procedure: BILATERAL POPLITEAL AND TIBIAL EMBOLECTOMIES, LEFT LEG FASCIOTOMY;  Surgeon: Oris Krystal FALCON, MD;  Location: [facility] OR;  Service: Vascular;  Laterality: Bilateral;   LAPAROSCOPY N/A 07/30/2018   Procedure: DIAGNOSTIC LAPAROSCOPY, Omentopexy gram patch, Washout of intra-abdominal abcesses x 3;  Surgeon: Sheldon Standing, MD;  Location: [facility] ORS;  Service: General;  Laterality: N/A;   OOPHORECTOMY Left 1977   Planters wart Left 2005   radial keratoto Bilateral    TONSILLECTOMY  1958   TOTAL HIP ARTHROPLASTY Left 07/19/2014   Procedure: TOTAL HIP ARTHROPLASTY;  Surgeon: Dempsey Sensor, MD;  Location: [facility] OR;  Service: Orthopedics;  Laterality: Left;   TUBAL LIGATION  1986    Allergies: Bee venom, Nsaids, Adhesive [tape], Codeine, Penicillins, and Sulfa antibiotics  Medications: Prior to Admission medications   Medication Sig Start Date End Date Taking? Authorizing Provider  acetaminophen  (TYLENOL ) 500 MG tablet Take 1,000 mg by mouth 2 (two) times daily as needed for mild pain (pain score 1-3) or moderate pain (pain score 4-6).   Yes [provider]  amantadine  (SYMMETREL ) 100 MG capsule Take 100 mg by mouth 2 (two) times daily. 11/02/22  Yes [provider]  fluorometholone (FML) 0.1 % ophthalmic suspension Place 1 drop into both eyes 2 (two) times daily.  01/09/23  Yes [provider]  LORazepam  (ATIVAN ) 0.5 MG tablet Take 0.5-1 mg by mouth daily. May take additional dose if needed for anxiety 12/13/23  Yes [provider]  Nutritional Supplements (ENSURE NUTRITION SHAKE) LIQD Take 1 Dose by mouth in the morning and at bedtime. 12/16/23  Yes [provider]  nystatin  cream (MYCOSTATIN ) apply to affected rash twice daily  as needed 05/03/23  Yes Burchette, Wolm ORN, MD  RESTASIS  0.05 % ophthalmic emulsion Place 1 drop into both eyes 2 (two) times daily. 02/12/23  Yes [provider]  risperiDONE  (RISPERDAL ) 1 MG tablet Take 1 mg by mouth daily. 12/13/23  Yes [provider]  sertraline  (ZOLOFT ) 100 MG tablet TAKE 1 TABLET(100 MG) BY MOUTH DAILY 05/08/23  Yes Burchette, Wolm ORN, MD  Vibegron  (GEMTESA ) 75 MG TABS Take 1 tablet by mouth daily Patient taking differently: Take 75 mg by mouth at bedtime. Take 1 tablet by mouth daily 10/24/22  Yes [place], Betty G, MD  MYRBETRIQ  25 MG TB24 tablet Take 25 mg by mouth daily. Patient not taking: Reported on 12/17/2023 12/11/22   [provider]  QUEtiapine  (SEROQUEL ) 25 MG tablet Take 1 tablet 2 times daily as needed for agitation Patient not taking: Reported on 12/17/2023 05/03/23   Micheal Wolm ORN, MD     Family History  Problem Relation Age of Onset   Lung cancer Mother    Stroke Father    Huntington's disease Paternal Grandmother    Huntington's disease Paternal Aunt    Huntington's disease Paternal Uncle    Arthritis Neg Hx        family hx   Cancer Neg Hx        prostate ca -family hx   Heart disease Neg Hx        family hx   Colon cancer Neg Hx    Esophageal cancer Neg Hx    Pancreatic cancer Neg Hx    Liver disease Neg Hx     Social History   Socioeconomic History   Marital status: Married    Spouse name: Not on file   Number of children: Not on file   Years of education: Not on file   Highest education level: Not on file  Occupational History   Occupation: unemployed    Comment: disabled back pain  Tobacco Use   Smoking status: Former    Current packs/day: 0.00    Average packs/day: 1 pack/day for 8.0 years (8.0 ttl pk-yrs)    Types: Cigarettes    Start date: 01/17/1986    Quit date: 01/17/1994    Years since quitting: 29.9   Smokeless tobacco: Never  Vaping Use   Vaping status: Never Used  Substance and Sexual  Activity   Alcohol use: Yes    Alcohol/week: 3.0 standard drinks of alcohol    Types: 3 Cans of beer per week    Comment: per week   Drug use: No   Sexual activity: Not on file  Other Topics Concern   Not on file  Social History Narrative   Married   Right BKA    Disabled due to back problems       Social Drivers of Health   Financial Resource Strain: Low Risk  (09/21/2021)   Overall Financial Resource Strain (CARDIA)    Difficulty of Paying Living Expenses: Not hard at all  Food Insecurity: No Food Insecurity (12/16/2023)   Hunger Vital Sign    Worried About Running Out of  Food in the Last Year: Never true    Ran Out of Food in the Last Year: Never true  Transportation Needs: No Transportation Needs (12/16/2023)   PRAPARE - Administrator, Civil Service (Medical): No    Lack of Transportation (Non-Medical): No  Physical Activity: Insufficiently Active (09/26/2022)   Exercise Vital Sign    Days of Exercise per Week: 2 days    Minutes of Exercise per Session: 60 min  Stress: No Stress Concern Present (09/26/2022)   [employer] of Occupational Health - Occupational Stress Questionnaire    Feeling of Stress : Not at all  Social Connections: Unknown (12/16/2023)   Social Connection and Isolation Panel    Frequency of Communication with Friends and Family: Patient unable to answer    Frequency of Social Gatherings with Friends and Family: Patient unable to answer    Attends Religious Services: Patient unable to answer    Active Member of Clubs or Organizations: Patient unable to answer    Attends Banker Meetings: Patient unable to answer    Marital Status: Married    Review of Systems: A 12 point ROS discussed and pertinent positives are indicated in the HPI above.  All other systems are negative.  Review of Systems  Vital Signs: BP 97/67   Pulse 74   Temp (!) 97.3 F (36.3 C) (Temporal)   Resp 12   Ht 5' 5 (1.651 m)   Wt 105 lb (47.6 kg)    SpO2 100%   BMI 17.47 kg/m   Physical Exam   Labs:  CBC: Recent Labs    02/21/23 1530 03/27/23 1412 12/16/23 0842 12/16/23 0913 12/17/23 0533  WBC 7.6 11.9* 9.7  --  11.8*  HGB 13.1 12.1 12.5 14.3 12.8  HCT 39.9 37.9 38.2 42.0 38.9  PLT 376.0 421* 432*  --  469*    COAGS: Recent Labs    12/16/23 0842 12/17/23 0550  INR 1.0 1.0    BMP: Recent Labs    02/21/23 1530 03/27/23 1412 12/16/23 0842 12/16/23 0913 12/17/23 0533  NA 141 143 139 141 139  K 3.5 3.6 3.5 3.5 3.5  CL 106 109 104 106 104  CO2 27 22 21*  --  23  GLUCOSE 108* 108* 126* 123* 106*  BUN 12 11 12 14  7*  CALCIUM  9.4 9.5 9.9  --  9.5  CREATININE 0.59 0.57 0.44 0.40* 0.44  GFRNONAA  --  >60 >60  --  >60    LIVER FUNCTION TESTS: Recent Labs    02/21/23 1530 12/16/23 0842 12/17/23 0533  BILITOT 0.5 7.2* 10.8*  AST 12 155* 150*  ALT 13 258* 239*  ALKPHOS 84 1,181* 1,155*  PROT 6.7 6.7 6.4*  ALBUMIN  4.4 3.0* 3.0*    TUMOR MARKERS: No results for input(s): AFPTM, CEA, CA199, CHROMGRNA in the last 8760 hours.  Assessment and Plan:  Right ischial tuberosity abscess: Natasha Frederick, 68 year old female, is tentatively scheduled 12/18/23 for an image-guided right ischial tuberosity abscess.   Risks and benefits discussed with the patient including bleeding, infection, damage to adjacent structures and sepsis.  All of the patient's questions were answered, patient is agreeable to proceed. She will be NPO at midnight. Last dose of lovenox  was 12/17/23 at 1554.   Consent signed and in chart.  Thank you for this interesting consult.  I greatly enjoyed meeting Natasha Frederick and look forward to participating in their care.  A copy of this report was sent to  the requesting provider on this date.  Electronically Signed: Warren Dais, AGACNP-BC 12/17/2023, 4:32 PM   I spent a total of 20 Minutes    in face to face in clinical consultation, greater than 50% of which was counseling/coordinating  care for right ischial tuberosity abscess.

## 2023-12-17 NOTE — Consult Note (Signed)
 Consultation Note Date: 12/17/2023   Patient Name: Natasha Frederick  DOB: May 06, 1955  MRN: [id number]  Age / Sex: 68 y.o., female  PCP: Micheal Wolm ORN, MD Referring Physician: Dennise Lavada POUR, MD  Reason for Consultation: Establishing goals of care  HPI/Patient Profile: 69 y.o. female  with past medical history of Huntington's disease, DVT, s/p BKA, endometriosis, arthritis, PUD with history of perforated gastric ulcer s/p Arlyss patch 2020, GERD admitted on 12/16/2023 with weakness, nausea, jaundice found to have pancreatic mass and obstructive jaundice. Concern for potential lung mets. Scheduled for ERCP 9/9.  Clinical Assessment and Goals of Care: Consult received and chart review completed. I met today with Dagoberto but no family at bedside. She has history of Huntington's disease with evidence of chorea. She does make eye contact. She answers yes/no questions mostly. Unclear her understanding of her situation. When asked what she is worried about she tells me everything. I reassured her and she gives me permission to speak with her husband.   I called and spoke with husband, Lewis. Lewis has spoken with medical team and understands our concerns for pancreatic mass with high suspicion of cancer. He agrees with ERCP with hopes of having more information about what we are really dealing with. He understands that her condition is declining. He shares that she has had 3 years of decline. He became ill and hospitalized with COVID in Dec 2024 and she was hospitalized do to no caregiver and ultimately was placed in [facility] [place]. He shares that she has had good care at [facility]. He shares she adjusted well but he has noted poor intake/appetite and cognitive decline over the past ~1 prior to admission.   We discussed goals of care. They have had conversations in light of Huntington's disease. Lewis shares that she has  declined much quicker and now concern for pancreatic mass was very unexpected. He understands that the future will only become more difficult and worsening QOL. We discuss code status and he shares that she has already elected DNR and would not want to be on life support. He understands that she would not be a good candidate for aggressive treatment if she has cancer. He wishes to see what comes from biopsy and discuss further from there. He is open to ongoing conversation.   All questions/concerns addressed. Emotional support provided.   Primary Decision Maker HCPOA husband Lewis    SUMMARY OF RECOMMENDATIONS   - DNR/DNI - Ongoing GOC conversations - Await pathology  Code Status/Advance Care Planning: DNR   Symptom Management:  Per attending, GI  Prognosis:  Overall prognosis poor   Discharge Planning: To Be Determined      Primary Diagnoses: Present on Admission:  Pancreatic mass  Obstructive jaundice  Abscess  Huntington disease (HCC)  Thrombocytosis  GERD (gastroesophageal reflux disease)   I have reviewed the medical record, interviewed the patient and family, and examined the patient. The following aspects are pertinent.  Past Medical History:  Diagnosis Date   Acute embolism and thrombosis of  deep vein of both distal lower extremities (HCC)    Arthritis    hip   C. difficile diarrhea    Endometriosis    Huntington disease (HCC)    Peptic ulcer    Perforated gastric ulcer (HCC)    Scoliosis    Social History   Socioeconomic History   Marital status: Married    Spouse name: Not on file   Number of children: Not on file   Years of education: Not on file   Highest education level: Not on file  Occupational History   Occupation: unemployed    Comment: disabled back pain  Tobacco Use   Smoking status: Former    Current packs/day: 0.00    Average packs/day: 1 pack/day for 8.0 years (8.0 ttl pk-yrs)    Types: Cigarettes    Start date: 01/17/1986     Quit date: 01/17/1994    Years since quitting: 29.9   Smokeless tobacco: Never  Vaping Use   Vaping status: Never Used  Substance and Sexual Activity   Alcohol use: Yes    Alcohol/week: 3.0 standard drinks of alcohol    Types: 3 Cans of beer per week    Comment: per week   Drug use: No   Sexual activity: Not on file  Other Topics Concern   Not on file  Social History Narrative   Married   Right BKA    Disabled due to back problems       Social Drivers of Health   Financial Resource Strain: Low Risk  (09/21/2021)   Overall Financial Resource Strain (CARDIA)    Difficulty of Paying Living Expenses: Not hard at all  Food Insecurity: No Food Insecurity (12/16/2023)   Hunger Vital Sign    Worried About Running Out of Food in the Last Year: Never true    Ran Out of Food in the Last Year: Never true  Transportation Needs: No Transportation Needs (12/16/2023)   PRAPARE - Administrator, Civil Service (Medical): No    Lack of Transportation (Non-Medical): No  Physical Activity: Insufficiently Active (09/26/2022)   Exercise Vital Sign    Days of Exercise per Week: 2 days    Minutes of Exercise per Session: 60 min  Stress: No Stress Concern Present (09/26/2022)   [employer] of Occupational Health - Occupational Stress Questionnaire    Feeling of Stress : Not at all  Social Connections: Unknown (12/16/2023)   Social Connection and Isolation Panel    Frequency of Communication with Friends and Family: Patient unable to answer    Frequency of Social Gatherings with Friends and Family: Patient unable to answer    Attends Religious Services: Patient unable to answer    Active Member of Clubs or Organizations: Patient unable to answer    Attends Banker Meetings: Patient unable to answer    Marital Status: Married   Family History  Problem Relation Age of Onset   Lung cancer Mother    Stroke Father    Huntington's disease Paternal Grandmother    Huntington's  disease Paternal Aunt    Huntington's disease Paternal Uncle    Arthritis Neg Hx        family hx   Cancer Neg Hx        prostate ca -family hx   Heart disease Neg Hx        family hx   Colon cancer Neg Hx    Esophageal cancer Neg Hx    Pancreatic  cancer Neg Hx    Liver disease Neg Hx    Scheduled Meds:  enoxaparin  (LOVENOX ) injection  40 mg Subcutaneous Q24H   pantoprazole  (PROTONIX ) IV  40 mg Intravenous Q12H   sodium chloride  flush  3 mL Intravenous Q12H   Continuous Infusions:  sodium chloride      PRN Meds:.albuterol , ondansetron  **OR** ondansetron  (ZOFRAN ) IV Allergies  Allergen Reactions   Bee Venom Anaphylaxis and Swelling   Nsaids Other (See Comments)    PERFORATED ULCER = NO MORE NSAIDs   Adhesive [Tape] Other (See Comments)    redness   Codeine Nausea And Vomiting   Penicillins     Did it involve swelling of the face/tongue/throat, SOB, or low BP? Yes Did it involve sudden or severe rash/hives, skin peeling, or any reaction on the inside of your mouth or nose? Unknown Did you need to seek medical attention at a hospital or doctor's office? Yes When did it last happen?   age 25-40    If all above answers are "NO", may proceed with cephalosporin use.    Sulfa Antibiotics Itching   Review of Systems  Unable to perform ROS: Acuity of condition    Physical Exam Vitals and nursing note reviewed.  Constitutional:      General: She is awake. She is not in acute distress.    Appearance: She is cachectic. She is ill-appearing.     Comments: Chorea movements  Cardiovascular:     Rate and Rhythm: Normal rate.  Pulmonary:     Effort: No tachypnea, accessory muscle usage or respiratory distress.  Abdominal:     General: Abdomen is flat.  Neurological:     Mental Status: She is confused.     Vital Signs: BP 130/82 (BP Location: Left Arm)   Pulse 66   Temp 97.9 F (36.6 C) (Oral)   Resp 18   Ht 5' 5 (1.651 m)   Wt 47.6 kg   SpO2 95%   BMI 17.47 kg/m   Pain Scale: 0-10   Pain Score: 0-No pain   SpO2: SpO2: 95 % O2 Device:SpO2: 95 % O2 Flow Rate: .   IO: Intake/output summary:  Intake/Output Summary (Last 24 hours) at 12/17/2023 1004 Last data filed at 12/17/2023 0400 Gross per 24 hour  Intake 594.53 ml  Output --  Net 594.53 ml    LBM: Last BM Date : 12/16/23 Baseline Weight: Weight: 47.6 kg Most recent weight: Weight: 47.6 kg     Palliative Assessment/Data:    Time Total: 80 min  Greater than 50%  of this time was spent counseling and coordinating care related to the above assessment and plan.  Signed by: [name], NP Palliative Medicine Team Pager # [phone] (M-F 8a-5p) Team Phone # [phone] (Nights/Weekends)

## 2023-12-17 NOTE — Progress Notes (Signed)
 Pharmacy Antibiotic Note  Natasha Frederick is a 68 y.o. female admitted on 12/16/2023 with weakness, nausea, and jaundice.  Pt noted to have hip/pelvis abscess on CT scan.  Pharmacy has been consulted for Vancomycin  and Cefepime   dosing for abscess.  Of note, patient is bedbound at baseline with low muscle mass.  SCr is likely not reflective of true renal function.  Will dose conservatively.  Plan:  Vancomycin  750mg  IV q24h (SCr 0.8, Vd 0.72, eAUC 484) Follow-up MRSA PCR Zosyn > Cefepime  + Flagyl  d/t PCN allergy Cefepime  2gm IV q12h Flagyl  500mg  IV q12h  Follow-up culture data and clinical progress.   Height: 5' 5 (165.1 cm) Weight: 47.6 kg (105 lb) IBW/kg (Calculated) : 57  Temp (24hrs), Avg:98 F (36.7 C), Min:97.7 F (36.5 C), Max:98.2 F (36.8 C)  Recent Labs  Lab 12/16/23 0842 12/16/23 0913 12/17/23 0533  WBC 9.7  --  11.8*  CREATININE 0.44 0.40* 0.44    Estimated Creatinine Clearance: 49.2 mL/min (by C-G formula based on SCr of 0.44 mg/dL).    Allergies  Allergen Reactions   Bee Venom Anaphylaxis and Swelling   Nsaids Other (See Comments)    PERFORATED ULCER = NO MORE NSAIDs   Adhesive [Tape] Other (See Comments)    redness   Codeine Nausea And Vomiting   Penicillins     Did it involve swelling of the face/tongue/throat, SOB, or low BP? Yes Did it involve sudden or severe rash/hives, skin peeling, or any reaction on the inside of your mouth or nose? Unknown Did you need to seek medical attention at a hospital or doctor's office? Yes When did it last happen?   age 89-40    If all above answers are "NO", may proceed with cephalosporin use.    Sulfa Antibiotics Itching    Antimicrobials this admission: Vanc 9/9 >> Cefepime  9/9 >> Flagyl  9/9 >>  Dose adjustments this admission:   Microbiology results: cultures pending    Thank you for allowing pharmacy to be a part of this patient's care.  [employer], Pharm.D., BCPS Clinical Pharmacist Clinical  phone for 12/17/2023 from 7:30-3:00 is [phone].  **Pharmacist phone directory can be found on [email or web address] listed under [facility].  12/17/2023 10:10 AM

## 2023-12-17 NOTE — Progress Notes (Signed)
 PROGRESS NOTE                                                                                                                                                                                                             Patient Demographics:    Natasha Frederick, is a 68 y.o. female, DOB - 10/09/1955, FMW:[id number]  Outpatient Primary MD for the patient is Micheal Wolm ORN, MD    LOS - 1  Admit date - 12/16/2023    Chief Complaint  Patient presents with   Weakness   fatigue       Brief Narrative (HPI from H&P)   68 y.o. female with medical history significant of Huntington disease, DVT,  s/p right BKA, endometriosis, arthritis, PUD with prior perforated gastric ulcer requiring Graham patch in 2020, and GERD presents with weakness, nausea, and jaundice.  History is difficult to obtain from patient.   Jaundice onset was noted this morning, prompting her visit. She also reported experiencing abdominal pain, primarily localized to the lower abdomen.  Denies having any fever or chills.   Subjective:    Natasha Frederick today has, in bed, no headache, some abdominal discomfort.   Assessment  & Plan :   Pancreatic mass Obstructive jaundice Elevated liver function studies Acute.  Patient presents with complaints of abdominal pain and found to have acute onset of jaundice.  Labs significant for alkaline phosphatase 1181, AST 155, ALT 258, ammonia 83, and total bilirubin 7.2.  CT scan of the abdomen and pelvis with contrast noted severe intrahepatic and extrahepatic biliary dilation with moderately pancreatic duct dilatation and ill-defined low-density mass in the pancreatic neck, new extrinsic enous narrowing at the portal vein/SMV confluence without signs of thrombosis, and 5 mm subpleural right lower lobe which could be possible metastases.  [facility] was consulted. - ERCP due on 12/17/2023, CA 19.9 pending, n.p.o. for now with IV fluids and  pain control.  Monitor.   Right ischial tuberosity abscess Acute.  Addendum to CT of the abdomen and pelvis with contrast noted a 2.9 x 2.5 cm enhancing fluid collection concerning for abscess with no associated cortical destruction to suggest osteomyelitis. - IR consulted for possible drainage and culture, empiric Zosyn for now   Huntington's disease - Delirium precautions - Continue amantadine    Thrombocytosis Acute on chronic.  Platelet count elevated at  432. - Continue to monitor   S/p right BKA   Peptic ulcer disease GERD IV PPI          Condition - Extremely Guarded  Family Communication  :   Husband Sheree [phone] on 12/17/2023 at 9:22 AM message left.  Son Aleene [phone]  on 12/17/2023 at 9:23 AM.  Code Status : Full code  Consults  : GI, palliative care  PUD Prophylaxis :    Procedures  :     ERCP  CT abdomen pelvis. 1. New severe intrahepatic and extrahepatic biliary dilatation with moderate pancreatic ductal dilatation an ill-defined low-density mass in the pancreatic neck, highly concerning for pancreatic malignancy. 2. New extrinsic venous narrowing at the portal vein/SMV confluence without evidence of acute thrombosis, worrisome for tumor unresectability. 3. New contour irregularity and nodularity of the spleen without focally suspicious parenchymal lesion. 4. New 5 mm subpleural right lower lobe pulmonary nodule, nonspecific, potentially a metastasis. Consider further evaluation full chest CT. No evidence of distant metastatic disease in the abdomen or pelvis. 5.  Aortic Atherosclerosis (ICD10-I70.0).  CT head.  1. No acute intracranial abnormality. 2. Chronic encephalomalacic changes within the frontal lobes bilaterally and the left cerebellar hemisphere. 3. Mild-to-moderate periventricular white matter disease.       Disposition Plan  :    Status is: Inpatient   DVT Prophylaxis  :    enoxaparin  (LOVENOX ) injection 40 mg Start: 12/16/23  1630   Lab Results  Component Value Date   PLT 469 (H) 12/17/2023    Diet :  Diet Order             Diet NPO time specified Except for: Sips with Meds  Diet effective now                    Inpatient Medications  Scheduled Meds:  enoxaparin  (LOVENOX ) injection  40 mg Subcutaneous Q24H   sodium chloride  flush  3 mL Intravenous Q12H   Continuous Infusions:  sodium chloride  75 mL/hr at 12/16/23 1945   PRN Meds:.albuterol , ondansetron  **OR** ondansetron  (ZOFRAN ) IV  Antibiotics  :    Anti-infectives (From admission, onward)    None         Objective:   Vitals:   12/16/23 1806 12/16/23 1932 12/17/23 0000 12/17/23 0400  BP: 138/86 122/69 (!) 152/93 130/82  Pulse: 91 (!) 103 83 66  Resp: (!) 24 19 (!) 21 18  Temp: 98 F (36.7 C) 97.7 F (36.5 C) 97.8 F (36.6 C) 97.9 F (36.6 C)  TempSrc: Oral Oral Oral Oral  SpO2: 96% 93% 94% 95%  Weight:      Height:        Wt Readings from Last 3 Encounters:  12/16/23 47.6 kg  03/28/23 67.6 kg  09/26/22 67.6 kg     Intake/Output Summary (Last 24 hours) at 12/17/2023 0920 Last data filed at 12/17/2023 0400 Gross per 24 hour  Intake 594.53 ml  Output --  Net 594.53 ml     Physical Exam  Awake Alert, No new F.N deficits, Normal affect [place].AT,PERRAL Supple Neck, No JVD,   Symmetrical Chest wall movement, Good air movement bilaterally, CTAB RRR,No Gallops,Rubs or new Murmurs,  +ve B.Sounds, Abd Soft, No tenderness,   No Cyanosis, Clubbing or edema       Data Review:    Recent Labs  Lab 12/16/23 0842 12/16/23 0913 12/17/23 0533  WBC 9.7  --  11.8*  HGB 12.5 14.3 12.8  HCT 38.2  42.0 38.9  PLT 432*  --  469*  MCV 88.8  --  87.8  MCH 29.1  --  28.9  MCHC 32.7  --  32.9  RDW 16.7*  --  16.6*  LYMPHSABS 1.1  --   --   MONOABS 0.7  --   --   EOSABS 0.2  --   --   BASOSABS 0.1  --   --     Recent Labs  Lab 12/16/23 0842 12/16/23 0913 12/17/23 0533 12/17/23 0550  NA 139 141 139  --   K 3.5  3.5 3.5  --   CL 104 106 104  --   CO2 21*  --  23  --   ANIONGAP 14  --  12  --   GLUCOSE 126* 123* 106*  --   BUN 12 14 7*  --   CREATININE 0.44 0.40* 0.44  --   AST 155*  --  150*  --   ALT 258*  --  239*  --   ALKPHOS 1,181*  --  1,155*  --   BILITOT 7.2*  --  10.8*  --   ALBUMIN  3.0*  --  3.0*  --   INR 1.0  --   --  1.0  AMMONIA 83*  --   --   --   MG 1.9  --   --   --   CALCIUM  9.9  --  9.5  --       Recent Labs  Lab 12/16/23 0842 12/17/23 0533 12/17/23 0550  INR 1.0  --  1.0  AMMONIA 83*  --   --   MG 1.9  --   --   CALCIUM  9.9 9.5  --     --------------------------------------------------------------------------------------------------------------- Lab Results  Component Value Date   CHOL 200 10/09/2017   HDL 68.70 10/09/2017   LDLCALC 108 (H) 10/09/2017   TRIG 113.0 10/09/2017   CHOLHDL 3 10/09/2017    Lab Results  Component Value Date   HGBA1C 5.3 07/31/2022   No results for input(s): TSH, T4TOTAL, FREET4, T3FREE, THYROIDAB in the last 72 hours. No results for input(s): VITAMINB12, FOLATE, FERRITIN, TIBC, IRON, RETICCTPCT in the last 72 hours. ------------------------------------------------------------------------------------------------------------------ Cardiac Enzymes No results for input(s): CKMB, TROPONINI, MYOGLOBIN in the last 168 hours.  Invalid input(s): CK  Micro Results Recent Results (from the past 240 hours)  Resp panel by RT-PCR (RSV, Flu A&B, Covid) Anterior Nasal Swab     Status: None   Collection Time: 12/16/23  8:42 AM   Specimen: Anterior Nasal Swab  Result Value Ref Range Status   SARS Coronavirus 2 by RT PCR NEGATIVE NEGATIVE Final   Influenza A by PCR NEGATIVE NEGATIVE Final   Influenza B by PCR NEGATIVE NEGATIVE Final    Comment: (NOTE) The Xpert Xpress SARS-CoV-2/FLU/RSV plus assay is intended as an aid in the diagnosis of influenza from Nasopharyngeal swab specimens and should not be  used as a sole basis for treatment. Nasal washings and aspirates are unacceptable for Xpert Xpress SARS-CoV-2/FLU/RSV testing.  Fact Sheet for Patients: BloggerCourse.com  Fact Sheet for Healthcare Providers: SeriousBroker.it  This test is not yet approved or cleared by the [place]  FDA and has been authorized for detection and/or diagnosis of SARS-CoV-2 by FDA under an Emergency Use Authorization (EUA). This EUA will remain in effect (meaning this test can be used) for the duration of the COVID-19 declaration under Section 564(b)(1) of the Act, 21 U.S.C. section 360bbb-3(b)(1), unless the authorization  is terminated or revoked.     Resp Syncytial Virus by PCR NEGATIVE NEGATIVE Final    Comment: (NOTE) Fact Sheet for Patients: BloggerCourse.com  Fact Sheet for Healthcare Providers: SeriousBroker.it  This test is not yet approved or cleared by the [place]  FDA and has been authorized for detection and/or diagnosis of SARS-CoV-2 by FDA under an Emergency Use Authorization (EUA). This EUA will remain in effect (meaning this test can be used) for the duration of the COVID-19 declaration under Section 564(b)(1) of the Act, 21 U.S.C. section 360bbb-3(b)(1), unless the authorization is terminated or revoked.  Performed at [facility], [address] [place], [place] [address]     Radiology Report CT ABDOMEN PELVIS W CONTRAST Addendum Date: 12/16/2023 ADDENDUM REPORT: 12/16/2023 13:17 ADDENDUM: Not mentioned in the original report is a peripherally enhancing fluid collection adjacent to the right ischial tuberosity, measuring 2.9 x 2.5 cm on image 84/5. Based on location, this is suspicious for an abscess related to an overlying decubitus ulcer, although no obvious skin defect is seen. No associated cortical destruction to suggest osteomyelitis. Electronically Signed    By: Elsie Perone M.D.   On: 12/16/2023 13:17   Result Date: 12/16/2023 CLINICAL DATA:  Abdominal pain, acute, nonlocalized abd pain, weakness, new jaundice. * Tracking Code: BO * EXAM: CT ABDOMEN AND PELVIS WITH CONTRAST TECHNIQUE: Multidetector CT imaging of the abdomen and pelvis was performed using the standard protocol following bolus administration of intravenous contrast. RADIATION DOSE REDUCTION: This exam was performed according to the departmental dose-optimization program which includes automated exposure control, adjustment of the mA and/or kV according to patient size and/or use of iterative reconstruction technique. CONTRAST:  75mL OMNIPAQUE  IOHEXOL  350 MG/ML SOLN COMPARISON:  Abdominopelvic CT 07/30/2018.  Chest CT 07/14/2021. FINDINGS: Technical note: Despite efforts by the technologist and patient, mild motion artifact is present on today's exam and could not be eliminated. This reduces exam sensitivity and specificity. Lower chest: 5 mm subpleural right lower lobe nodule on image 3/7 appears new from previous chest CT. The lung bases are otherwise clear. No significant pleural or pericardial effusion. Hepatobiliary: The liver has a non cirrhotic morphology without focal parenchymal abnormality. However, there is new severe diffuse intrahepatic biliary dilatation associated with moderate distention of the gallbladder. The common hepatic duct is dilated to 2.3 cm. There is focal narrowing of the common bile duct above the pancreatic head with suspected ductal wall enhancement. Pancreas: New moderate dilatation of the pancreatic duct within the pancreatic tail. There is an ill-defined low-density mass within the pancreatic neck, measuring 1.6 x 1.3 cm on image 28/5. Findings are highly concerning for pancreatic malignancy with associated double duct sign. Spleen: New contour irregularity and nodularity of the spleen without focally suspicious parenchymal lesion. Adrenals/Urinary Tract: Both adrenal  glands appear normal. No evidence of urinary tract calculus, suspicious renal lesion or hydronephrosis. The bladder appears unremarkable for its degree of distention. Stomach/Bowel: No enteric contrast administered. The stomach appears unremarkable for its degree of distension. No evidence of bowel wall thickening, distention or surrounding inflammatory change. There is a large amount of stool within the rectum. Vascular/Lymphatic: No enlarged abdominopelvic lymph nodes are identified. There is aortic and branch vessel atherosclerosis without evidence of aneurysm or large vessel occlusion. New extrinsic venous narrowing at the portal vein/SMV confluence without evidence of acute thrombosis. Reproductive: The uterus and ovaries appear unremarkable. No evidence of adnexal mass. Tubal ligation clips noted on the right. Other: Previously demonstrated ascites has resolved.  No peritoneal nodularity or pneumoperitoneum identified. Musculoskeletal: No acute or significant osseous findings. Status post left total hip arthroplasty and 2 level lumbar fusion. There is a convex left thoracolumbar scoliosis with multilevel spondylosis. Moderate right hip degenerative changes are noted. Old fractures of the right pubic rami. IMPRESSION: 1. New severe intrahepatic and extrahepatic biliary dilatation with moderate pancreatic ductal dilatation an ill-defined low-density mass in the pancreatic neck, highly concerning for pancreatic malignancy. 2. New extrinsic venous narrowing at the portal vein/SMV confluence without evidence of acute thrombosis, worrisome for tumor unresectability. 3. New contour irregularity and nodularity of the spleen without focally suspicious parenchymal lesion. 4. New 5 mm subpleural right lower lobe pulmonary nodule, nonspecific, potentially a metastasis. Consider further evaluation full chest CT. No evidence of distant metastatic disease in the abdomen or pelvis. 5.  Aortic Atherosclerosis (ICD10-I70.0).  Electronically Signed: By: Elsie Perone M.D. On: 12/16/2023 10:26   CT Head Wo Contrast Result Date: 12/16/2023 EXAM: CT HEAD WITHOUT CONTRAST 12/16/2023 09:57:34 AM TECHNIQUE: CT of the head was performed without the administration of intravenous contrast. Automated exposure control, iterative reconstruction, and/or weight based adjustment of the mA/kV was utilized to reduce the radiation dose to as low as reasonably achievable. COMPARISON: 03/31/2001 CLINICAL HISTORY: Delirium. FINDINGS: BRAIN AND VENTRICLES: Moderate generalized cerebral and cerebellar volume loss. Chronic encephalomalacic changes within the frontal lobes bilaterally and the left cerebellar hemisphere. Mild-to-moderate periventricular white matter disease. No acute hemorrhage. No evidence of acute infarct. No hydrocephalus. No extra-axial collection. No mass effect or midline shift. ORBITS: No acute abnormality. SINUSES: No acute abnormality. SOFT TISSUES AND SKULL: No acute soft tissue abnormality. No skull fracture. Calcifications within the carotid siphons. IMPRESSION: 1. No acute intracranial abnormality. 2. Chronic encephalomalacic changes within the frontal lobes bilaterally and the left cerebellar hemisphere. 3. Mild-to-moderate periventricular white matter disease. Electronically signed by: Evalene Coho MD 12/16/2023 10:09 AM EDT RP Workstation: [id number]   DG Chest Portable 1 View Result Date: 12/16/2023 CLINICAL DATA:  Cough. EXAM: PORTABLE CHEST 1 VIEW COMPARISON:  07/03/2021. FINDINGS: Bilateral lung fields are clear. Linear calcifications noted along the left diaphragmatic pleura, nonspecific but commonly seen with asbestos related disease. Bilateral costophrenic angles are clear. No pneumothorax. Stable cardio-mediastinal silhouette. There is unfolding of aortic arch. No acute osseous abnormalities. The soft tissues are within normal limits. IMPRESSION: No active disease. Electronically Signed   By: Ree Molt M.D.    On: 12/16/2023 09:02     Signature  -   Lavada Stank M.D on 12/17/2023 at 9:20 AM   -  To page go to [email or web address]

## 2023-12-17 NOTE — Plan of Care (Signed)
 ?  Problem: Clinical Measurements: ?Goal: Diagnostic test results will improve ?Outcome: Progressing ?  ?Problem: Safety: ?Goal: Ability to remain free from injury will improve ?Outcome: Progressing ?  ?

## 2023-12-17 NOTE — Progress Notes (Signed)
 Attempted to completed admission; Patient unable to answer questions/family not present.

## 2023-12-17 NOTE — Anesthesia Procedure Notes (Addendum)
 Procedure Name: Intubation Date/Time: 12/17/2023 12:53 PM  Performed by: Mollie Olivia SAUNDERS, CRNAPre-anesthesia Checklist: Patient identified, Emergency Drugs available, Suction available and Patient being monitored Patient Re-evaluated:Patient Re-evaluated prior to induction Oxygen Delivery Method: Circle system utilized Preoxygenation: Pre-oxygenation with 100% oxygen Induction Type: IV induction Ventilation: Mask ventilation without difficulty Laryngoscope Size: Glidescope and 3 Grade View: Grade II Tube type: Oral Tube size: 7.0 mm Number of attempts: 1 Airway Equipment and Method: Rigid stylet and Video-laryngoscopy Placement Confirmation: ETT inserted through vocal cords under direct vision, positive ETCO2 and breath sounds checked- equal and bilateral Secured at: 20 cm Tube secured with: Tape Dental Injury: Teeth and Oropharynx as per pre-operative assessment  Difficulty Due To: Difficulty was anticipated, Difficult Airway- due to reduced neck mobility, Difficult Airway- due to limited oral opening and Difficult Airway- due to anterior larynx

## 2023-12-17 NOTE — Progress Notes (Signed)
 PT Cancellation Note  Patient Details Name: Thelda Gagan MRN: [id number] DOB: January 12, 1956   Cancelled Treatment:    Reason Eval/Treat Not Completed: PT screened, no needs identified, will sign off (Per chart review and conversation with charge nurse. Pt is bedbound at baseline and from a facility. No acute PT needs identified. Will sign off.)   Loriann Bosserman 12/17/2023, 1:41 PM

## 2023-12-17 NOTE — Anesthesia Postprocedure Evaluation (Signed)
 Anesthesia Post Note  Patient: Natasha Frederick  Procedure(s) Performed: ERCP, WITH INTERVENTION IF INDICATED EGD (ESOPHAGOGASTRODUODENOSCOPY) BRUSH BIOPSY, BILE DUCT INSERTION, STENT, BILE DUCT     Patient location during evaluation: PACU Anesthesia Type: General Level of consciousness: awake Pain management: pain level controlled Vital Signs Assessment: post-procedure vital signs reviewed and stable Respiratory status: spontaneous breathing, nonlabored ventilation and respiratory function stable Cardiovascular status: blood pressure returned to baseline and stable Postop Assessment: no apparent nausea or vomiting Anesthetic complications: no   No notable events documented.  Last Vitals:  Vitals:   12/17/23 1450 12/17/23 1500  BP: 102/71 97/67  Pulse: 75 74  Resp: 14 12  Temp:    SpO2: 100% 100%    Last Pain:  Vitals:   12/17/23 1431  TempSrc: Temporal  PainSc:    Pain Goal:                   Natasha Frederick

## 2023-12-17 NOTE — Progress Notes (Signed)
 PT Cancellation Note  Patient Details Name: Elvena Oyer MRN: [id number] DOB: 1955-11-13   Cancelled Treatment:    Reason Eval/Treat Not Completed: Patient at procedure or test/unavailable (Pt off the floor at endoscopy. Will follow up later if time allows.)   Marquel Pottenger 12/17/2023, 12:30 PM

## 2023-12-17 NOTE — Transfer of Care (Signed)
 Immediate Anesthesia Transfer of Care Note  Patient: Natasha Frederick  Procedure(s) Performed: ERCP, WITH INTERVENTION IF INDICATED EGD (ESOPHAGOGASTRODUODENOSCOPY) BRUSH BIOPSY, BILE DUCT INSERTION, STENT, BILE DUCT  Patient Location: Endoscopy Unit  Anesthesia Type:General  Level of Consciousness: awake, alert , and confused  Airway & Oxygen Therapy: Patient Spontanous Breathing  Post-op Assessment: Report given to RN and Post -op Vital signs reviewed and stable  Post vital signs: Reviewed and stable  Last Vitals:  Vitals Value Taken Time  BP 103/68 12/17/23 14:40  Temp 36.3 C 12/17/23 14:31  Pulse 79 12/17/23 14:41  Resp 18 12/17/23 14:41  SpO2 95 % 12/17/23 14:41  Vitals shown include unfiled device data.  Last Pain:  Vitals:   12/17/23 1431  TempSrc: Temporal  PainSc:          Complications: No notable events documented.

## 2023-12-17 NOTE — Plan of Care (Signed)
   Problem: Education: Goal: Knowledge of General Education information will improve Description: Including pain rating scale, medication(s)/side effects and non-pharmacologic comfort measures Outcome: Progressing   Problem: Health Behavior/Discharge Planning: Goal: Ability to manage health-related needs will improve Outcome: Progressing   Problem: Clinical Measurements: Goal: Ability to maintain clinical measurements within normal limits will improve Outcome: Progressing   Problem: Activity: Goal: Risk for activity intolerance will decrease Outcome: Progressing   Problem: Nutrition: Goal: Adequate nutrition will be maintained Outcome: Progressing   Problem: Coping: Goal: Level of anxiety will decrease Outcome: Progressing   Problem: Safety: Goal: Ability to remain free from injury will improve Outcome: Progressing

## 2023-12-17 NOTE — Interval H&P Note (Signed)
 History and Physical Interval Note:  12/17/2023 12:36 PM  Natasha Frederick  has presented today for surgery, with the diagnosis of jaundice, pancreatic mass.  The various methods of treatment have been discussed with the patient and family. After consideration of risks, benefits and other options for treatment, the patient has consented to  Procedure(s): ERCP, WITH INTERVENTION IF INDICATED (N/A) as a surgical intervention.  The patient's history has been reviewed, patient examined, no change in status, stable for surgery.  I have reviewed the patient's chart and labs.  Questions were answered to the patient's satisfaction.     [name]

## 2023-12-18 ENCOUNTER — Inpatient Hospital Stay (HOSPITAL_COMMUNITY)

## 2023-12-18 ENCOUNTER — Encounter (HOSPITAL_COMMUNITY): Payer: Self-pay | Admitting: Internal Medicine

## 2023-12-18 DIAGNOSIS — K8689 Other specified diseases of pancreas: Secondary | ICD-10-CM | POA: Diagnosis not present

## 2023-12-18 LAB — COMPREHENSIVE METABOLIC PANEL WITH GFR
ALT: 172 U/L — ABNORMAL HIGH (ref 0–44)
AST: 72 U/L — ABNORMAL HIGH (ref 15–41)
Albumin: 2.6 g/dL — ABNORMAL LOW (ref 3.5–5.0)
Alkaline Phosphatase: 852 U/L — ABNORMAL HIGH (ref 38–126)
Anion gap: 13 (ref 5–15)
BUN: 10 mg/dL (ref 8–23)
CO2: 18 mmol/L — ABNORMAL LOW (ref 22–32)
Calcium: 8.9 mg/dL (ref 8.9–10.3)
Chloride: 111 mmol/L (ref 98–111)
Creatinine, Ser: 0.42 mg/dL — ABNORMAL LOW (ref 0.44–1.00)
GFR, Estimated: >60 mL/min (ref >60)
Glucose, Bld: 123 mg/dL — ABNORMAL HIGH (ref 70–99)
Potassium: 3.6 mmol/L (ref 3.5–5.1)
Sodium: 142 mmol/L (ref 135–145)
Total Bilirubin: 3.6 mg/dL — ABNORMAL HIGH (ref 0.0–1.2)
Total Protein: 5.3 g/dL — ABNORMAL LOW (ref 6.5–8.1)

## 2023-12-18 LAB — CBC WITH DIFFERENTIAL/PLATELET
Abs Immature Granulocytes: 0.04 K/uL (ref 0.00–0.07)
Basophils Absolute: 0 K/uL (ref 0.0–0.1)
Basophils Relative: 0 %
Eosinophils Absolute: 0 K/uL (ref 0.0–0.5)
Eosinophils Relative: 0 %
HCT: 33.5 % — ABNORMAL LOW (ref 36.0–46.0)
Hemoglobin: 10.9 g/dL — ABNORMAL LOW (ref 12.0–15.0)
Immature Granulocytes: 0 %
Lymphocytes Relative: 14 %
Lymphs Abs: 1.4 K/uL (ref 0.7–4.0)
MCH: 28.8 pg (ref 26.0–34.0)
MCHC: 32.5 g/dL (ref 30.0–36.0)
MCV: 88.6 fL (ref 80.0–100.0)
Monocytes Absolute: 0.8 K/uL (ref 0.1–1.0)
Monocytes Relative: 8 %
Neutro Abs: 7.2 K/uL (ref 1.7–7.7)
Neutrophils Relative %: 78 %
Platelets: 362 K/uL (ref 150–400)
RBC: 3.78 MIL/uL — ABNORMAL LOW (ref 3.87–5.11)
RDW: 16.9 % — ABNORMAL HIGH (ref 11.5–15.5)
WBC: 9.5 K/uL (ref 4.0–10.5)
nRBC: 0 % (ref 0.0–0.2)

## 2023-12-18 LAB — SURGICAL PATHOLOGY

## 2023-12-18 LAB — PROTIME-INR
INR: 1 (ref 0.8–1.2)
Prothrombin Time: 14 s (ref 11.4–15.2)

## 2023-12-18 LAB — C-REACTIVE PROTEIN: CRP: 6.3 mg/dL — ABNORMAL HIGH (ref <1.0)

## 2023-12-18 LAB — MAGNESIUM: Magnesium: 1.6 mg/dL — ABNORMAL LOW (ref 1.7–2.4)

## 2023-12-18 LAB — CYTOLOGY - NON PAP

## 2023-12-18 LAB — CANCER ANTIGEN 19-9: CA 19-9: 1504 U/mL — ABNORMAL HIGH (ref 0–35)

## 2023-12-18 LAB — PROCALCITONIN: Procalcitonin: 0.16 ng/mL

## 2023-12-18 MED ORDER — LIDOCAINE 1 % OPTIME INJ - NO CHARGE
10.0000 mL | Freq: Once | INTRAMUSCULAR | Status: AC
  Administered 2023-12-18: 10 mL
  Filled 2023-12-18: qty 10

## 2023-12-18 MED ORDER — AMANTADINE HCL 100 MG PO CAPS
100.0000 mg | ORAL_CAPSULE | Freq: Two times a day (BID) | ORAL | Status: DC
  Administered 2023-12-18 – 2023-12-20 (×4): 100 mg via ORAL
  Filled 2023-12-18 (×4): qty 1

## 2023-12-18 MED ORDER — SERTRALINE HCL 100 MG PO TABS
100.0000 mg | ORAL_TABLET | Freq: Every day | ORAL | Status: DC
  Administered 2023-12-18 – 2023-12-20 (×3): 100 mg via ORAL
  Filled 2023-12-18 (×3): qty 1

## 2023-12-18 MED ORDER — MIDAZOLAM HCL 2 MG/2ML IJ SOLN
INTRAMUSCULAR | Status: AC
  Filled 2023-12-18: qty 4

## 2023-12-18 MED ORDER — MIRABEGRON ER 25 MG PO TB24
25.0000 mg | ORAL_TABLET | Freq: Every day | ORAL | Status: DC
Start: 2023-12-18 — End: 2023-12-20
  Administered 2023-12-18 – 2023-12-20 (×3): 25 mg via ORAL
  Filled 2023-12-18 (×3): qty 1

## 2023-12-18 MED ORDER — ENOXAPARIN SODIUM 40 MG/0.4ML IJ SOSY
40.0000 mg | PREFILLED_SYRINGE | Freq: Every day | INTRAMUSCULAR | Status: DC
  Administered 2023-12-18 – 2023-12-19 (×2): 40 mg via SUBCUTANEOUS
  Filled 2023-12-18: qty 0.4

## 2023-12-18 MED ORDER — RISPERIDONE 2 MG PO TABS
4.0000 mg | ORAL_TABLET | Freq: Two times a day (BID) | ORAL | Status: DC
Start: 2023-12-18 — End: 2023-12-19
  Administered 2023-12-18: 4 mg via ORAL
  Filled 2023-12-18 (×2): qty 2

## 2023-12-18 MED ORDER — MAGNESIUM SULFATE 4 GM/100ML IV SOLN
4.0000 g | Freq: Once | INTRAVENOUS | Status: AC
Start: 2023-12-18 — End: 2023-12-19
  Administered 2023-12-18: 4 g via INTRAVENOUS
  Filled 2023-12-18 (×2): qty 100

## 2023-12-18 MED ORDER — FENTANYL CITRATE (PF) 100 MCG/2ML IJ SOLN
INTRAMUSCULAR | Status: AC
  Filled 2023-12-18: qty 4

## 2023-12-18 NOTE — Procedures (Signed)
  Procedure:  CT biopsy R ischial process 18g x3 in saline Preprocedure diagnosis: The primary encounter diagnosis was Hepatic encephalopathy (HCC). Diagnoses of Pancreatic mass, Hyperbilirubinemia, Elevated liver enzymes, and Pulmonary nodule were also pertinent to this visit. Postprocedure diagnosis: same EBL:    minimal Complications:   none immediate  See full dictation in [employer].  CHARM Toribio Faes MD Main # [phone] Pager  [phone] Mobile [phone]

## 2023-12-18 NOTE — TOC Initial Note (Signed)
 Transition of Care [facility]) - Initial/Assessment Note    Patient Details  Name: Natasha Frederick MRN: [id number] Date of Birth: 1956-02-06  Transition of Care [facility]) CM/SW Contact:    Inocente GORMAN Kindle, LCSW Phone Number: 12/18/2023, 2:00 PM  Clinical Narrative:                 Patient admitted from [facility]. CSW continuing to follow for needs.   Expected Discharge Plan: Assisted Living Barriers to Discharge: Continued Medical Work up   Patient Goals and CMS Choice            Expected Discharge Plan and Services In-house Referral: Clinical Social Work, Hospice / Palliative Care     Living arrangements for the past 2 months: Assisted Living Facility                                      Prior Living Arrangements/Services Living arrangements for the past 2 months: Assisted Living Facility Lives with:: Facility Resident Patient language and need for interpreter reviewed:: Yes Do you feel safe going back to the place where you live?: Yes      Need for Family Participation in Patient Care: Yes (Comment) Care giver support system in place?: Yes (comment)   Criminal Activity/Legal Involvement Pertinent to Current Situation/Hospitalization: No - Comment as needed  Activities of Daily Living   ADL Screening (condition at time of admission) Independently performs ADLs?: No Does the patient have a NEW difficulty with bathing/dressing/toileting/self-feeding that is expected to last >3 days?: No Does the patient have a NEW difficulty with getting in/out of bed, walking, or climbing stairs that is expected to last >3 days?: No Does the patient have a NEW difficulty with communication that is expected to last >3 days?: No Is the patient deaf or have difficulty hearing?: No Does the patient have difficulty seeing, even when wearing glasses/contacts?: No Does the patient have difficulty concentrating, remembering, or making decisions?: Yes  Permission Sought/Granted Permission  sought to share information with : Facility Medical sales representative, Family Supports Permission granted to share information with : No  Share Information with NAME: Geter,Lewis -Spouse [phone] / [phone]  Permission granted to share info w AGENCY: ALF        Emotional Assessment Appearance:: Appears stated age Attitude/Demeanor/Rapport: Unable to Assess Affect (typically observed): Unable to Assess Orientation: : Oriented to Self Alcohol / Substance Use: Not Applicable Psych Involvement: No (comment)  Admission diagnosis:  Hepatic encephalopathy (HCC) [K76.82] Hyperbilirubinemia [E80.6] Pulmonary nodule [R91.1] Elevated liver enzymes [R74.8] Pancreatic mass [K86.89] Patient Active Problem List   Diagnosis Date Noted   Duodenitis 12/17/2023   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   Pancreatic mass 12/16/2023   Obstructive jaundice 12/16/2023   Abscess 12/16/2023   Thrombocytosis 12/16/2023   Hx of below knee amputation, right (HCC) 12/16/2023   GERD (gastroesophageal reflux disease) 11/27/2021   Osteoporosis 11/23/2021   Depression, recurrent (HCC) 05/12/2019   Below-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) 01/26/2019   Anxiety and depression 12/21/2018   Chronic back pain 11/05/2018   Cold right foot 08/06/2018   Delirium 08/01/2018   Perforated gastric ulcer s/p omental patch 07/30/2018 07/31/2018   ARF (acute renal failure) (HCC) 07/30/2018   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   Primary osteoarthritis of left hip 07/19/2014   Scoliosis of lumbar spine 01/05/2014   PCP:  Micheal Wolm ORN, MD Pharmacy:   [id number] - [place], [place] - [address] [place] [address] Phone: [phone] Fax: [phone]  [facility] [place]  - [place], [place] - [address] [place] [address] Phone: [phone] Fax:  [phone]  [facility] - [place]  - [place], [place] - [address] [place] [address] Phone: [phone] Fax: [phone]  [facility], [place] - [address] [place] [address] Phone: [phone] Fax: [phone]     Social Drivers of Health (SDOH) Social History: SDOH Screenings   Food Insecurity: No Food Insecurity (12/16/2023)  Housing: Low Risk  (12/16/2023)  Transportation Needs: No Transportation Needs (12/16/2023)  Utilities: Not At Risk (12/16/2023)  Alcohol Screen: Low Risk  (09/26/2022)  Depression (PHQ2-9): Low Risk  (09/26/2022)  Financial Resource Strain: Low Risk  (09/21/2021)  Physical Activity: Insufficiently Active (09/26/2022)  Social Connections: Unknown (12/16/2023)  Stress: No Stress Concern Present (09/26/2022)  Tobacco Use: Medium Risk (12/17/2023)   SDOH Interventions:     Readmission Risk Interventions     No data to display

## 2023-12-18 NOTE — Plan of Care (Signed)
 ?  Problem: Clinical Measurements: ?Goal: Diagnostic test results will improve ?Outcome: Progressing ?  ?Problem: Safety: ?Goal: Ability to remain free from injury will improve ?Outcome: Progressing ?  ?

## 2023-12-18 NOTE — Progress Notes (Signed)
 PROGRESS NOTE                                                                                                                                                                                                             Patient Demographics:    Natasha Frederick, is a 68 y.o. female, DOB - 12/10/1955, FMW:[id number]  Outpatient Primary MD for the patient is Micheal Wolm ORN, MD    LOS - 2  Admit date - 12/16/2023    Chief Complaint  Patient presents with   Weakness   fatigue       Brief Narrative (HPI from H&P)   68 y.o. female with medical history significant of Huntington disease, DVT,  s/p right BKA, endometriosis, arthritis, PUD with prior perforated gastric ulcer requiring Graham patch in 2020, and GERD presents with weakness, nausea, and jaundice.  History is difficult to obtain from patient.   Jaundice onset was noted this morning, prompting her visit. She also reported experiencing abdominal pain, primarily localized to the lower abdomen.  Denies having any fever or chills.   Subjective:   Patient in bed appears to be in no distress, denies any headache chest or abdominal pain.   Assessment  & Plan :   Obstructive jaundice likely due to malignant appearing CBD stricture s/p ERCP and stenting of the stricture. Acute.  Patient presents with complaints of abdominal pain and found to have acute onset of jaundice.  Labs significant for alkaline phosphatase 1181, AST 155, ALT 258, ammonia 83, and total bilirubin 7.2.  CT scan of the abdomen and pelvis with contrast noted severe intrahepatic and extrahepatic biliary dilation with moderately pancreatic duct dilatation and ill-defined low-density mass in the pancreatic neck, new extrinsic enous narrowing at the portal vein/SMV confluence without signs of thrombosis, and 5 mm subpleural right lower lobe which could be possible metastases.  [facility] was consulted.    She underwent  ERCP on 12/17/2023 with a malignant appearing stricture found on ERCP in the lower part of CBD, this was stented. 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  Palliative care and GI on board, long-term care discussions underway.  For now continue antibiotics and follow cultures.   Right ischial tuberosity abscess Acute.  Addendum to CT of the abdomen and pelvis with  contrast noted a 2.9 x 2.5 cm enhancing fluid collection concerning for abscess with no associated cortical destruction to suggest osteomyelitis. - IR consulted for possible drainage and culture, on empiric antibiotics for now.  IR reinformed on 12/18/2023.   Huntington's disease - Delirium precautions - Continue amantadine    Thrombocytosis Acute on chronic.  Platelet count elevated at 432. - Continue to monitor   S/p right BKA   Peptic ulcer disease GERD IV PPI          Condition - Extremely Guarded  Family Communication  :   Husband Sheree [phone] on 12/17/2023 at 9:22 AM message left.  Son Aleene [phone]  on 12/17/2023 at 9:23 AM.  Code Status : Full code  Consults  : GI, palliative care  PUD Prophylaxis :    Procedures  :     ERCP  Impression:               - A single localized biliary stricture was found in                            the lower third of the main bile duct.                            Approximately 3 cm distal common bile duct                            stricture. Marked upstream diffuse biliary                            dilation. The stricture was malignant appearing.                            This was sampled by brushing for cytology x 2. This                            stricture was treated with stent placement. 10                            French 7 cm plastic stent.                           - Duodenitis. Biopsied.                           - Markedly J-shaped stomach  requiring                           - LA Grade B reflux esophagitis.                           - Markedly J-shaped stomach requiring use of the                            Olympus duodenoscope Recommendation:           - Returned patient to floor  Clear liquid (nectar thick) diet today reassess                            tomorrow for advancing if possible.                           Follow-up LFTs in AM.                           Await cytologies and biopsies.                           Await CA 19?9   CT abdomen pelvis. 1. New severe intrahepatic and extrahepatic biliary dilatation with moderate pancreatic ductal dilatation an ill-defined low-density mass in the pancreatic neck, highly concerning for pancreatic malignancy. 2. New extrinsic venous narrowing at the portal vein/SMV confluence without evidence of acute thrombosis, worrisome for tumor unresectability. 3. New contour irregularity and nodularity of the spleen without focally suspicious parenchymal lesion. 4. New 5 mm subpleural right lower lobe pulmonary nodule, nonspecific, potentially a metastasis. Consider further evaluation full chest CT. No evidence of distant metastatic disease in the abdomen or pelvis. 5.  Aortic Atherosclerosis (ICD10-I70.0).  CT head.  1. No acute intracranial abnormality. 2. Chronic encephalomalacic changes within the frontal lobes bilaterally and the left cerebellar hemisphere. 3. Mild-to-moderate periventricular white matter disease.       Disposition Plan  :    Status is: Inpatient   DVT Prophylaxis  :    enoxaparin  (LOVENOX ) injection 40 mg Start: 12/16/23 1630   Lab Results  Component Value Date   PLT 362 12/18/2023    Diet :  Diet Order             Diet NPO time specified Except for: Sips with Meds  Diet effective midnight                    Inpatient Medications  Scheduled Meds:  enoxaparin  (LOVENOX ) injection  40 mg Subcutaneous Q24H   pantoprazole   (PROTONIX ) IV  40 mg Intravenous Q12H   sodium chloride  flush  3 mL Intravenous Q12H   Continuous Infusions:  sodium chloride  75 mL/hr at 12/18/23 0408   ceFEPime  (MAXIPIME ) IV 2 g (12/17/23 2122)   magnesium  sulfate bolus IVPB     metronidazole  500 mg (12/17/23 2244)   vancomycin  750 mg (12/17/23 1554)   PRN Meds:.albuterol , ondansetron  **OR** ondansetron  (ZOFRAN ) IV  Antibiotics  :    Anti-infectives (From admission, onward)    Start     Dose/Rate Route Frequency Ordered Stop   12/17/23 1245  ciprofloxacin  (CIPRO ) IVPB 400 mg  Status:  Discontinued        400 mg 200 mL/hr over 60 Minutes Intravenous  Once 12/17/23 1236 12/17/23 1501   12/17/23 1200  vancomycin  (VANCOREADY) IVPB 750 mg/150 mL        750 mg 150 mL/hr over 60 Minutes Intravenous Every 24 hours 12/17/23 1012     12/17/23 1100  ceFEPIme  (MAXIPIME ) 2 g in sodium chloride  0.9 % 100 mL IVPB        2 g 200 mL/hr over 30 Minutes Intravenous 2 times daily 12/17/23 1012     12/17/23 1100  metroNIDAZOLE  (FLAGYL ) IVPB 500 mg        500 mg 100 mL/hr over 60 Minutes Intravenous Every 12  hours 12/17/23 1012           Objective:   Vitals:   12/17/23 1659 12/17/23 2000 12/17/23 2303 12/18/23 0742  BP: 131/86 (!) 89/56 137/84   Pulse:  80 88   Resp: 19 17 19    Temp: 98.3 F (36.8 C) 98.2 F (36.8 C) 98.1 F (36.7 C)   TempSrc: Oral Oral Oral Oral  SpO2:  97% 97%   Weight:      Height:        Wt Readings from Last 3 Encounters:  12/16/23 47.6 kg  03/28/23 67.6 kg  09/26/22 67.6 kg     Intake/Output Summary (Last 24 hours) at 12/18/2023 0850 Last data filed at 12/17/2023 2030 Gross per 24 hour  Intake 820 ml  Output --  Net 820 ml     Physical Exam  Awake Alert, No new F.N deficits, Normal affect [place].AT,PERRAL Supple Neck, No JVD,   Symmetrical Chest wall movement, Good air movement bilaterally, CTAB RRR,No Gallops,Rubs or new Murmurs,  +ve B.Sounds, Abd Soft, No tenderness,   No Cyanosis, Clubbing  or edema       Data Review:    Recent Labs  Lab 12/16/23 0842 12/16/23 0913 12/17/23 0533 12/17/23 1607 12/18/23 0346  WBC 9.7  --  11.8* 12.5* 9.5  HGB 12.5 14.3 12.8 11.1* 10.9*  HCT 38.2 42.0 38.9 33.7* 33.5*  PLT 432*  --  469* 430* 362  MCV 88.8  --  87.8 88.7 88.6  MCH 29.1  --  28.9 29.2 28.8  MCHC 32.7  --  32.9 32.9 32.5  RDW 16.7*  --  16.6* 16.8* 16.9*  LYMPHSABS 1.1  --   --   --  1.4  MONOABS 0.7  --   --   --  0.8  EOSABS 0.2  --   --   --  0.0  BASOSABS 0.1  --   --   --  0.0    Recent Labs  Lab 12/16/23 0842 12/16/23 0913 12/17/23 0533 12/17/23 0550 12/18/23 0346  NA 139 141 139  --  142  K 3.5 3.5 3.5  --  3.6  CL 104 106 104  --  111  CO2 21*  --  23  --  18*  ANIONGAP 14  --  12  --  13  GLUCOSE 126* 123* 106*  --  123*  BUN 12 14 7*  --  10  CREATININE 0.44 0.40* 0.44  --  0.42*  AST 155*  --  150*  --  72*  ALT 258*  --  239*  --  172*  ALKPHOS 1,181*  --  1,155*  --  852*  BILITOT 7.2*  --  10.8*  --  3.6*  ALBUMIN  3.0*  --  3.0*  --  2.6*  CRP  --   --  8.4*  --  6.3*  PROCALCITON  --   --  0.16  --  0.16  INR 1.0  --   --  1.0 1.0  AMMONIA 83*  --   --   --   --   MG 1.9  --   --   --  1.6*  CALCIUM  9.9  --  9.5  --  8.9      Recent Labs  Lab 12/16/23 0842 12/17/23 0533 12/17/23 0550 12/18/23 0346  CRP  --  8.4*  --  6.3*  PROCALCITON  --  0.16  --  0.16  INR 1.0  --  1.0 1.0  AMMONIA 83*  --   --   --   MG 1.9  --   --  1.6*  CALCIUM  9.9 9.5  --  8.9    --------------------------------------------------------------------------------------------------------------- Lab Results  Component Value Date   CHOL 200 10/09/2017   HDL 68.70 10/09/2017   LDLCALC 108 (H) 10/09/2017   TRIG 113.0 10/09/2017   CHOLHDL 3 10/09/2017    Lab Results  Component Value Date   HGBA1C 5.3 07/31/2022   No results for input(s): TSH, T4TOTAL, FREET4, T3FREE, THYROIDAB in the last 72 hours. No results for input(s):  VITAMINB12, FOLATE, FERRITIN, TIBC, IRON, RETICCTPCT in the last 72 hours. ------------------------------------------------------------------------------------------------------------------ Cardiac Enzymes No results for input(s): CKMB, TROPONINI, MYOGLOBIN in the last 168 hours.  Invalid input(s): CK  Micro Results Recent Results (from the past 240 hours)  Resp panel by RT-PCR (RSV, Flu A&B, Covid) Anterior Nasal Swab     Status: None   Collection Time: 12/16/23  8:42 AM   Specimen: Anterior Nasal Swab  Result Value Ref Range Status   SARS Coronavirus 2 by RT PCR NEGATIVE NEGATIVE Final   Influenza A by PCR NEGATIVE NEGATIVE Final   Influenza B by PCR NEGATIVE NEGATIVE Final    Comment: (NOTE) The Xpert Xpress SARS-CoV-2/FLU/RSV plus assay is intended as an aid in the diagnosis of influenza from Nasopharyngeal swab specimens and should not be used as a sole basis for treatment. Nasal washings and aspirates are unacceptable for Xpert Xpress SARS-CoV-2/FLU/RSV testing.  Fact Sheet for Patients: BloggerCourse.com  Fact Sheet for Healthcare Providers: SeriousBroker.it  This test is not yet approved or cleared by the [place]  FDA and has been authorized for detection and/or diagnosis of SARS-CoV-2 by FDA under an Emergency Use Authorization (EUA). This EUA will remain in effect (meaning this test can be used) for the duration of the COVID-19 declaration under Section 564(b)(1) of the Act, 21 U.S.C. section 360bbb-3(b)(1), unless the authorization is terminated or revoked.     Resp Syncytial Virus by PCR NEGATIVE NEGATIVE Final    Comment: (NOTE) Fact Sheet for Patients: BloggerCourse.com  Fact Sheet for Healthcare Providers: SeriousBroker.it  This test is not yet approved or cleared by the [place]  FDA and has been authorized for detection and/or  diagnosis of SARS-CoV-2 by FDA under an Emergency Use Authorization (EUA). This EUA will remain in effect (meaning this test can be used) for the duration of the COVID-19 declaration under Section 564(b)(1) of the Act, 21 U.S.C. section 360bbb-3(b)(1), unless the authorization is terminated or revoked.  Performed at [facility], [address] [place], [place] [address]   MRSA Next Gen by PCR, Nasal     Status: None   Collection Time: 12/17/23  9:32 AM   Specimen: Nasal Mucosa; Nasal Swab  Result Value Ref Range Status   MRSA by PCR Next Gen NOT DETECTED NOT DETECTED Final    Comment: (NOTE) The GeneXpert MRSA Assay (FDA approved for NASAL specimens only), is one component of a comprehensive MRSA colonization surveillance program. It is not intended to diagnose MRSA infection nor to guide or monitor treatment for MRSA infections. Test performance is not FDA approved in patients less than 52 years old. Performed at [facility], [address] [place], [place] [address]     Radiology Report CT ABDOMEN PELVIS W CONTRAST Addendum Date: 12/16/2023 ADDENDUM REPORT: 12/16/2023 13:17 ADDENDUM: Not mentioned in the original report is a peripherally enhancing fluid collection adjacent to the right  ischial tuberosity, measuring 2.9 x 2.5 cm on image 84/5. Based on location, this is suspicious for an abscess related to an overlying decubitus ulcer, although no obvious skin defect is seen. No associated cortical destruction to suggest osteomyelitis. Electronically Signed   By: Elsie Perone M.D.   On: 12/16/2023 13:17   Result Date: 12/16/2023 CLINICAL DATA:  Abdominal pain, acute, nonlocalized abd pain, weakness, new jaundice. * Tracking Code: BO * EXAM: CT ABDOMEN AND PELVIS WITH CONTRAST TECHNIQUE: Multidetector CT imaging of the abdomen and pelvis was performed using the standard protocol following bolus administration of intravenous contrast. RADIATION DOSE REDUCTION: This exam  was performed according to the departmental dose-optimization program which includes automated exposure control, adjustment of the mA and/or kV according to patient size and/or use of iterative reconstruction technique. CONTRAST:  75mL OMNIPAQUE  IOHEXOL  350 MG/ML SOLN COMPARISON:  Abdominopelvic CT 07/30/2018.  Chest CT 07/14/2021. FINDINGS: Technical note: Despite efforts by the technologist and patient, mild motion artifact is present on today's exam and could not be eliminated. This reduces exam sensitivity and specificity. Lower chest: 5 mm subpleural right lower lobe nodule on image 3/7 appears new from previous chest CT. The lung bases are otherwise clear. No significant pleural or pericardial effusion. Hepatobiliary: The liver has a non cirrhotic morphology without focal parenchymal abnormality. However, there is new severe diffuse intrahepatic biliary dilatation associated with moderate distention of the gallbladder. The common hepatic duct is dilated to 2.3 cm. There is focal narrowing of the common bile duct above the pancreatic head with suspected ductal wall enhancement. Pancreas: New moderate dilatation of the pancreatic duct within the pancreatic tail. There is an ill-defined low-density mass within the pancreatic neck, measuring 1.6 x 1.3 cm on image 28/5. Findings are highly concerning for pancreatic malignancy with associated double duct sign. Spleen: New contour irregularity and nodularity of the spleen without focally suspicious parenchymal lesion. Adrenals/Urinary Tract: Both adrenal glands appear normal. No evidence of urinary tract calculus, suspicious renal lesion or hydronephrosis. The bladder appears unremarkable for its degree of distention. Stomach/Bowel: No enteric contrast administered. The stomach appears unremarkable for its degree of distension. No evidence of bowel wall thickening, distention or surrounding inflammatory change. There is a large amount of stool within the rectum.  Vascular/Lymphatic: No enlarged abdominopelvic lymph nodes are identified. There is aortic and branch vessel atherosclerosis without evidence of aneurysm or large vessel occlusion. New extrinsic venous narrowing at the portal vein/SMV confluence without evidence of acute thrombosis. Reproductive: The uterus and ovaries appear unremarkable. No evidence of adnexal mass. Tubal ligation clips noted on the right. Other: Previously demonstrated ascites has resolved. No peritoneal nodularity or pneumoperitoneum identified. Musculoskeletal: No acute or significant osseous findings. Status post left total hip arthroplasty and 2 level lumbar fusion. There is a convex left thoracolumbar scoliosis with multilevel spondylosis. Moderate right hip degenerative changes are noted. Old fractures of the right pubic rami. IMPRESSION: 1. New severe intrahepatic and extrahepatic biliary dilatation with moderate pancreatic ductal dilatation an ill-defined low-density mass in the pancreatic neck, highly concerning for pancreatic malignancy. 2. New extrinsic venous narrowing at the portal vein/SMV confluence without evidence of acute thrombosis, worrisome for tumor unresectability. 3. New contour irregularity and nodularity of the spleen without focally suspicious parenchymal lesion. 4. New 5 mm subpleural right lower lobe pulmonary nodule, nonspecific, potentially a metastasis. Consider further evaluation full chest CT. No evidence of distant metastatic disease in the abdomen or pelvis. 5.  Aortic Atherosclerosis (ICD10-I70.0). Electronically Signed: By: Elsie Perone  M.D. On: 12/16/2023 10:26   CT Head Wo Contrast Result Date: 12/16/2023 EXAM: CT HEAD WITHOUT CONTRAST 12/16/2023 09:57:34 AM TECHNIQUE: CT of the head was performed without the administration of intravenous contrast. Automated exposure control, iterative reconstruction, and/or weight based adjustment of the mA/kV was utilized to reduce the radiation dose to as low as  reasonably achievable. COMPARISON: 03/31/2001 CLINICAL HISTORY: Delirium. FINDINGS: BRAIN AND VENTRICLES: Moderate generalized cerebral and cerebellar volume loss. Chronic encephalomalacic changes within the frontal lobes bilaterally and the left cerebellar hemisphere. Mild-to-moderate periventricular white matter disease. No acute hemorrhage. No evidence of acute infarct. No hydrocephalus. No extra-axial collection. No mass effect or midline shift. ORBITS: No acute abnormality. SINUSES: No acute abnormality. SOFT TISSUES AND SKULL: No acute soft tissue abnormality. No skull fracture. Calcifications within the carotid siphons. IMPRESSION: 1. No acute intracranial abnormality. 2. Chronic encephalomalacic changes within the frontal lobes bilaterally and the left cerebellar hemisphere. 3. Mild-to-moderate periventricular white matter disease. Electronically signed by: Evalene Coho MD 12/16/2023 10:09 AM EDT RP Workstation: [id number]   DG Chest Portable 1 View Result Date: 12/16/2023 CLINICAL DATA:  Cough. EXAM: PORTABLE CHEST 1 VIEW COMPARISON:  07/03/2021. FINDINGS: Bilateral lung fields are clear. Linear calcifications noted along the left diaphragmatic pleura, nonspecific but commonly seen with asbestos related disease. Bilateral costophrenic angles are clear. No pneumothorax. Stable cardio-mediastinal silhouette. There is unfolding of aortic arch. No acute osseous abnormalities. The soft tissues are within normal limits. IMPRESSION: No active disease. Electronically Signed   By: Ree Molt M.D.   On: 12/16/2023 09:02     Signature  -   Lavada Stank M.D on 12/18/2023 at 8:50 AM   -  To page go to [email or web address]

## 2023-12-18 NOTE — Plan of Care (Signed)
  Problem: Education: Goal: Knowledge of General Education information will improve Description: Including pain rating scale, medication(s)/side effects and non-pharmacologic comfort measures Outcome: Progressing   Problem: Clinical Measurements: Goal: Ability to maintain clinical measurements within normal limits will improve Outcome: Progressing Goal: Diagnostic test results will improve Outcome: Progressing   Problem: Activity: Goal: Risk for activity intolerance will decrease Outcome: Progressing   Problem: Nutrition: Goal: Adequate nutrition will be maintained Outcome: Progressing   Problem: Coping: Goal: Level of anxiety will decrease Outcome: Progressing

## 2023-12-18 NOTE — Sedation Documentation (Signed)
 Pt IV not functioning at time out, unable to give sedation. Per MD [facility] completing procedure with local only, pt tolerated procedure. IV removed and communicated with 5W RN, IV team order placed. Wasted versed  and fentanyl  with Fay GORMAN PEAK.

## 2023-12-19 ENCOUNTER — Ambulatory Visit: Payer: Self-pay | Admitting: Internal Medicine

## 2023-12-19 DIAGNOSIS — K8689 Other specified diseases of pancreas: Secondary | ICD-10-CM | POA: Diagnosis not present

## 2023-12-19 DIAGNOSIS — G10 Huntington's disease: Secondary | ICD-10-CM | POA: Diagnosis not present

## 2023-12-19 DIAGNOSIS — Z515 Encounter for palliative care: Secondary | ICD-10-CM | POA: Diagnosis not present

## 2023-12-19 DIAGNOSIS — Z7189 Other specified counseling: Secondary | ICD-10-CM | POA: Diagnosis not present

## 2023-12-19 LAB — CBC WITH DIFFERENTIAL/PLATELET
Abs Immature Granulocytes: 0.04 K/uL (ref 0.00–0.07)
Basophils Absolute: 0.1 K/uL (ref 0.0–0.1)
Basophils Relative: 1 %
Eosinophils Absolute: 0.3 K/uL (ref 0.0–0.5)
Eosinophils Relative: 3 %
HCT: 31 % — ABNORMAL LOW (ref 36.0–46.0)
Hemoglobin: 10.2 g/dL — ABNORMAL LOW (ref 12.0–15.0)
Immature Granulocytes: 0 %
Lymphocytes Relative: 13 %
Lymphs Abs: 1.2 K/uL (ref 0.7–4.0)
MCH: 28.5 pg (ref 26.0–34.0)
MCHC: 32.9 g/dL (ref 30.0–36.0)
MCV: 86.6 fL (ref 80.0–100.0)
Monocytes Absolute: 0.8 K/uL (ref 0.1–1.0)
Monocytes Relative: 8 %
Neutro Abs: 7.1 K/uL (ref 1.7–7.7)
Neutrophils Relative %: 75 %
Platelets: 356 K/uL (ref 150–400)
RBC: 3.58 MIL/uL — ABNORMAL LOW (ref 3.87–5.11)
RDW: 16.5 % — ABNORMAL HIGH (ref 11.5–15.5)
WBC: 9.4 K/uL (ref 4.0–10.5)
nRBC: 0 % (ref 0.0–0.2)

## 2023-12-19 LAB — COMPREHENSIVE METABOLIC PANEL WITH GFR
ALT: 120 U/L — ABNORMAL HIGH (ref 0–44)
AST: 39 U/L (ref 15–41)
Albumin: 2.4 g/dL — ABNORMAL LOW (ref 3.5–5.0)
Alkaline Phosphatase: 675 U/L — ABNORMAL HIGH (ref 38–126)
Anion gap: 12 (ref 5–15)
BUN: 6 mg/dL — ABNORMAL LOW (ref 8–23)
CO2: 19 mmol/L — ABNORMAL LOW (ref 22–32)
Calcium: 8.4 mg/dL — ABNORMAL LOW (ref 8.9–10.3)
Chloride: 109 mmol/L (ref 98–111)
Creatinine, Ser: 0.41 mg/dL — ABNORMAL LOW (ref 0.44–1.00)
GFR, Estimated: >60 mL/min (ref >60)
Glucose, Bld: 90 mg/dL (ref 70–99)
Potassium: 2.9 mmol/L — ABNORMAL LOW (ref 3.5–5.1)
Sodium: 140 mmol/L (ref 135–145)
Total Bilirubin: 3 mg/dL — ABNORMAL HIGH (ref 0.0–1.2)
Total Protein: 4.8 g/dL — ABNORMAL LOW (ref 6.5–8.1)

## 2023-12-19 LAB — C-REACTIVE PROTEIN: CRP: 3.6 mg/dL — ABNORMAL HIGH (ref <1.0)

## 2023-12-19 LAB — PROCALCITONIN: Procalcitonin: 0.15 ng/mL

## 2023-12-19 LAB — MAGNESIUM: Magnesium: 1.5 mg/dL — ABNORMAL LOW (ref 1.7–2.4)

## 2023-12-19 MED ORDER — HALOPERIDOL LACTATE 5 MG/ML IJ SOLN: 1.0000 mg | Freq: Four times a day (QID) | INTRAMUSCULAR | Status: DC | PRN

## 2023-12-19 MED ORDER — RISPERIDONE 1 MG PO TABS
1.0000 mg | ORAL_TABLET | Freq: Every day | ORAL | Status: DC
  Administered 2023-12-19 – 2023-12-20 (×2): 1 mg via ORAL
  Filled 2023-12-19 (×2): qty 1

## 2023-12-19 MED ORDER — PANTOPRAZOLE SODIUM 40 MG PO TBEC
40.0000 mg | DELAYED_RELEASE_TABLET | Freq: Two times a day (BID) | ORAL | Status: DC
  Administered 2023-12-19 – 2023-12-20 (×2): 40 mg via ORAL
  Filled 2023-12-19 (×2): qty 1

## 2023-12-19 MED ORDER — DIPHENHYDRAMINE HCL 50 MG/ML IJ SOLN: 25.0000 mg | Freq: Four times a day (QID) | INTRAMUSCULAR | Status: DC | PRN

## 2023-12-19 MED ORDER — MAGNESIUM SULFATE 4 GM/100ML IV SOLN
4.0000 g | Freq: Once | INTRAVENOUS | Status: AC
  Administered 2023-12-19: 4 g via INTRAVENOUS
  Filled 2023-12-19: qty 100

## 2023-12-19 MED ORDER — LORAZEPAM 1 MG PO TABS
1.0000 mg | ORAL_TABLET | Freq: Four times a day (QID) | ORAL | Status: DC | PRN
  Administered 2023-12-19: 1 mg via ORAL
  Filled 2023-12-19: qty 1

## 2023-12-19 MED ORDER — POTASSIUM CHLORIDE CRYS ER 20 MEQ PO TBCR
40.0000 meq | EXTENDED_RELEASE_TABLET | Freq: Two times a day (BID) | ORAL | Status: AC
Start: 2023-12-19 — End: 2023-12-19
  Administered 2023-12-19 (×2): 40 meq via ORAL
  Filled 2023-12-19 (×2): qty 2

## 2023-12-19 MED ORDER — POTASSIUM CHLORIDE CRYS ER 20 MEQ PO TBCR
20.0000 meq | EXTENDED_RELEASE_TABLET | Freq: Once | ORAL | Status: AC
  Administered 2023-12-19: 20 meq via ORAL
  Filled 2023-12-19: qty 1

## 2023-12-19 NOTE — Plan of Care (Signed)

## 2023-12-19 NOTE — Progress Notes (Signed)
  Spoke to husband Lewis about diagnosis of adenocarcinoma of the pancreas.   Reviewed that she is not an operative candidate, radiation and/or chemotherapy are possible but would incur quite a bit of patient work, and potential side effects.  Offered oncology consult if desired but after speaking with him, he prefers not to intervene with any therapy for her cancer.   Would pursue hospice, he is amenable to that.  Natasha CHARLENA Commander, MD, [facility] See TRACEY on call - gastroenterology for best contact person 12/19/2023 5:28 PM

## 2023-12-19 NOTE — Progress Notes (Signed)
 Palliative:  HPI: 68 y.o. female  with past medical history of Huntington's disease, DVT, s/p BKA, endometriosis, arthritis, PUD with history of perforated gastric ulcer s/p Arlyss patch 2020, GERD admitted on 12/16/2023 with weakness, nausea, jaundice found to have pancreatic mass and obstructive jaundice. Concern for potential lung mets. Scheduled for ERCP 9/9.   I came to meet with Natasha Frederick but she is receiving personal care from [facility]. I called and left voicemail for husband, Natasha Frederick, requesting call back. I discussed with Dr. Dennise.   I returned to bedside later in the day and Natasha Frederick is at bedside. I spoke with Natasha Frederick and Ezzard and shared that we did receive results back positive for adenocarcinoma. I explained that this does appear to be an unfortunate cancer. We discussed further that this comes with a poor prognosis. We discussed burdens of cancer treatment in the setting of her pre-existing co-morbidities and that she would not be a good candidate for treatment. Natasha Frederick expresses understanding. Natasha Frederick shares that she wants to go home. We discussed alternative options of return to [facility] and having additional help from hospice there and they agree this would be a good option. Natasha Frederick asks about prognosis and we discussed likely months but hard to know for sure. He agrees to continue to optimize [name]'s health, acknowledging there is likely not much more we can offer. Natasha Frederick agrees with discharge to [facility] with hospice support. Natasha Frederick requests to speak with the doctor as well - I let Dr. Dennise know that husband is at bedside and is requesting to speak with him.   All questions/concerns addressed. Emotional support provided.   Exam: Alert, answers simple questions appropriately and overall appropriate responses. Some underlying confusion. No distress. Huntington's chorea movements. Breathing regular, unlabored. Abd soft. R BKA. Warm to touch.   Plan: - DNR - Return to [facility] with hospice  45  min  [name], NP Palliative Medicine Team Pager [phone] (Please see [email or web address] for schedule) Team Phone [phone]

## 2023-12-19 NOTE — Progress Notes (Addendum)
 PROGRESS NOTE                                                                                                                                                                                                             Patient Demographics:    Natasha Frederick, is a 69 y.o. female, DOB - 1955-04-24, FMW:[id number]  Outpatient Primary MD for the patient is Micheal Wolm ORN, MD    LOS - 3  Admit date - 12/16/2023    Chief Complaint  Patient presents with   Weakness   fatigue       Brief Narrative (HPI from H&P)   68 y.o. female with medical history significant of Huntington disease, DVT,  s/p right BKA, endometriosis, arthritis, PUD with prior perforated gastric ulcer requiring Graham patch in 2020, and GERD presents with weakness, nausea, and jaundice.  History is difficult to obtain from patient.   Jaundice onset was noted this morning, prompting her visit. She also reported experiencing abdominal pain, primarily localized to the lower abdomen.  Denies having any fever or chills.   Subjective:   Patient in bed, appears comfortable, denies any headache, no fever, no chest pain or pressure, no shortness of breath , no abdominal pain. No focal weakness.   Assessment  & Plan :   Obstructive jaundice likely due to malignant appearing CBD stricture s/p ERCP and stenting of the stricture. Acute.  Patient presents with complaints of abdominal pain and found to have acute onset of jaundice.  Labs significant for alkaline phosphatase 1181, AST 155, ALT 258, ammonia 83, and total bilirubin 7.2.  CT scan of the abdomen and pelvis with contrast noted severe intrahepatic and extrahepatic biliary dilation with moderately pancreatic duct dilatation and ill-defined low-density mass in the pancreatic neck, new extrinsic enous narrowing at the portal vein/SMV confluence without signs of thrombosis, and 5 mm subpleural right lower lobe which could be  possible metastases.  [facility] was consulted.    She underwent ERCP on 12/17/2023 with a malignant appearing stricture found on ERCP in the lower part of CBD, this was stented. Prelim biopsy results from the ERCP suggestive of adenocarcinoma, CA 19.9 extremely elevated ALT suggesting possible underlying cholangiocarcinoma, per husband if there is malignancy he would like to focus more on comfort, will discuss the plan with him again, palliative care and GI  on board..  Message left on his phone about the biopsy results on 12/19/2023.   Right ischial tuberosity mass Acute.  Addendum to CT of the abdomen and pelvis with contrast noted a 2.9 x 2.5 cm enhancing fluid collection concerning for abscess with no associated cortical destruction to suggest osteomyelitis. - IR consulted, upon evaluation by IR no abscess was found but a right pelvic mass was noted which was biopsied on 12/18/2023.   Huntington's disease - Delirium precautions - Continue amantadine    Thrombocytosis Acute on chronic.  Platelet count elevated at 432. - Continue to monitor   S/p right BKA   Hypomagnesemia and hypokalemia.  Replaced.    Peptic ulcer disease GERD IV PPI          Condition - Extremely Guarded  Family Communication  :   Husband Sheree [phone] on 12/17/2023 at 9:22 AM message left.  Discussed plan with him on 12/18/2023 over the phone, left detailed message on 12/19/2023 at 7:45 AM  Son Aleene [phone]  on 12/17/2023 at 9:23 AM.  Code Status : Full code  Consults  : GI, palliative care  PUD Prophylaxis :    Procedures  :     CT-guided needle biopsy of the right pelvic mass by IR on 12/18/2023.     ERCP on 12/17/2023  Impression:               - A single localized biliary stricture was found in                            the lower third of the main bile duct.                            Approximately 3 cm distal common bile duct                            stricture. Marked upstream diffuse  biliary                            dilation. The stricture was malignant appearing.                            This was sampled by brushing for cytology x 2. This                            stricture was treated with stent placement. 10                            French 7 cm plastic stent.                           - Duodenitis. Biopsied.                           - Markedly J-shaped stomach requiring                           - LA Grade B reflux esophagitis.                           -  Markedly J-shaped stomach requiring use of the                            Olympus duodenoscope Recommendation:           - Returned patient to floor                           Clear liquid (nectar thick) diet today reassess                            tomorrow for advancing if possible.                           Follow-up LFTs in AM.                           Await cytologies and biopsies.                           Await CA 19?9   CT abdomen pelvis. 1. New severe intrahepatic and extrahepatic biliary dilatation with moderate pancreatic ductal dilatation an ill-defined low-density mass in the pancreatic neck, highly concerning for pancreatic malignancy. 2. New extrinsic venous narrowing at the portal vein/SMV confluence without evidence of acute thrombosis, worrisome for tumor unresectability. 3. New contour irregularity and nodularity of the spleen without focally suspicious parenchymal lesion. 4. New 5 mm subpleural right lower lobe pulmonary nodule, nonspecific, potentially a metastasis. Consider further evaluation full chest CT. No evidence of distant metastatic disease in the abdomen or pelvis. 5.  Aortic Atherosclerosis (ICD10-I70.0).  CT head.  1. No acute intracranial abnormality. 2. Chronic encephalomalacic changes within the frontal lobes bilaterally and the left cerebellar hemisphere. 3. Mild-to-moderate periventricular white matter disease.       Disposition Plan  :    Status is: Inpatient   DVT  Prophylaxis  :    enoxaparin  (LOVENOX ) injection 40 mg Start: 12/18/23 2200   Lab Results  Component Value Date   PLT 356 12/19/2023    Diet :  Diet Order             DIET SOFT Fluid consistency: Nectar Thick  Diet effective now                    Inpatient Medications  Scheduled Meds:  amantadine   100 mg Oral BID   enoxaparin  (LOVENOX ) injection  40 mg Subcutaneous QHS   mirabegron  ER  25 mg Oral Daily   pantoprazole  (PROTONIX ) IV  40 mg Intravenous Q12H   potassium chloride   20 mEq Oral Once   potassium chloride   40 mEq Oral BID   risperidone   4 mg Oral BID   sertraline   100 mg Oral Daily   sodium chloride  flush  3 mL Intravenous Q12H   Continuous Infusions:  ceFEPime  (MAXIPIME ) IV 2 g (12/18/23 2141)   magnesium  sulfate bolus IVPB 4 g (12/19/23 0654)   metronidazole  500 mg (12/19/23 0042)   vancomycin  Stopped (12/18/23 1330)   PRN Meds:.albuterol , ondansetron  **OR** ondansetron  (ZOFRAN ) IV  Antibiotics  :    Anti-infectives (From admission, onward)    Start     Dose/Rate Route Frequency Ordered Stop   12/17/23 1245  ciprofloxacin  (CIPRO ) IVPB 400 mg  Status:  Discontinued  400 mg 200 mL/hr over 60 Minutes Intravenous  Once 12/17/23 1236 12/17/23 1501   12/17/23 1200  vancomycin  (VANCOREADY) IVPB 750 mg/150 mL        750 mg 150 mL/hr over 60 Minutes Intravenous Every 24 hours 12/17/23 1012     12/17/23 1100  ceFEPIme  (MAXIPIME ) 2 g in sodium chloride  0.9 % 100 mL IVPB        2 g 200 mL/hr over 30 Minutes Intravenous 2 times daily 12/17/23 1012     12/17/23 1100  metroNIDAZOLE  (FLAGYL ) IVPB 500 mg        500 mg 100 mL/hr over 60 Minutes Intravenous Every 12 hours 12/17/23 1012           Objective:   Vitals:   12/18/23 1500 12/18/23 1505 12/18/23 1705 12/18/23 2055  BP: 124/79 138/86 (!) 143/70   Pulse: 92 84 78   Resp: (!) 27 19 18    Temp:   98.7 F (37.1 C) 97.9 F (36.6 C)  TempSrc:    Oral  SpO2: 100% 100% 96%   Weight:       Height:        Wt Readings from Last 3 Encounters:  12/16/23 47.6 kg  03/28/23 67.6 kg  09/26/22 67.6 kg     Intake/Output Summary (Last 24 hours) at 12/19/2023 0746 Last data filed at 12/18/2023 1541 Gross per 24 hour  Intake 862.99 ml  Output 520 ml  Net 342.99 ml     Physical Exam  Awake Alert, No new F.N deficits, Normal affect [place].AT,PERRAL Supple Neck, No JVD,   Symmetrical Chest wall movement, Good air movement bilaterally, CTAB RRR,No Gallops,Rubs or new Murmurs,  +ve B.Sounds, Abd Soft, No tenderness,   R BKA      Data Review:    Recent Labs  Lab 12/16/23 0842 12/16/23 0913 12/17/23 0533 12/17/23 1607 12/18/23 0346 12/19/23 0215  WBC 9.7  --  11.8* 12.5* 9.5 9.4  HGB 12.5 14.3 12.8 11.1* 10.9* 10.2*  HCT 38.2 42.0 38.9 33.7* 33.5* 31.0*  PLT 432*  --  469* 430* 362 356  MCV 88.8  --  87.8 88.7 88.6 86.6  MCH 29.1  --  28.9 29.2 28.8 28.5  MCHC 32.7  --  32.9 32.9 32.5 32.9  RDW 16.7*  --  16.6* 16.8* 16.9* 16.5*  LYMPHSABS 1.1  --   --   --  1.4 1.2  MONOABS 0.7  --   --   --  0.8 0.8  EOSABS 0.2  --   --   --  0.0 0.3  BASOSABS 0.1  --   --   --  0.0 0.1    Recent Labs  Lab 12/16/23 0842 12/16/23 0913 12/17/23 0533 12/17/23 0550 12/18/23 0346 12/19/23 0215  NA 139 141 139  --  142 140  K 3.5 3.5 3.5  --  3.6 2.9*  CL 104 106 104  --  111 109  CO2 21*  --  23  --  18* 19*  ANIONGAP 14  --  12  --  13 12  GLUCOSE 126* 123* 106*  --  123* 90  BUN 12 14 7*  --  10 6*  CREATININE 0.44 0.40* 0.44  --  0.42* 0.41*  AST 155*  --  150*  --  72* 39  ALT 258*  --  239*  --  172* 120*  ALKPHOS 1,181*  --  1,155*  --  852* 675*  BILITOT 7.2*  --  10.8*  --  3.6* 3.0*  ALBUMIN  3.0*  --  3.0*  --  2.6* 2.4*  CRP  --   --  8.4*  --  6.3* 3.6*  PROCALCITON  --   --  0.16  --  0.16 0.15  INR 1.0  --   --  1.0 1.0  --   AMMONIA 83*  --   --   --   --   --   MG 1.9  --   --   --  1.6* 1.5*  CALCIUM  9.9  --  9.5  --  8.9 8.4*      Recent Labs   Lab 12/16/23 0842 12/17/23 0533 12/17/23 0550 12/18/23 0346 12/19/23 0215  CRP  --  8.4*  --  6.3* 3.6*  PROCALCITON  --  0.16  --  0.16 0.15  INR 1.0  --  1.0 1.0  --   AMMONIA 83*  --   --   --   --   MG 1.9  --   --  1.6* 1.5*  CALCIUM  9.9 9.5  --  8.9 8.4*    --------------------------------------------------------------------------------------------------------------- Lab Results  Component Value Date   CHOL 200 10/09/2017   HDL 68.70 10/09/2017   LDLCALC 108 (H) 10/09/2017   TRIG 113.0 10/09/2017   CHOLHDL 3 10/09/2017    Lab Results  Component Value Date   HGBA1C 5.3 07/31/2022   No results for input(s): TSH, T4TOTAL, FREET4, T3FREE, THYROIDAB in the last 72 hours. No results for input(s): VITAMINB12, FOLATE, FERRITIN, TIBC, IRON, RETICCTPCT in the last 72 hours. ------------------------------------------------------------------------------------------------------------------ Cardiac Enzymes No results for input(s): CKMB, TROPONINI, MYOGLOBIN in the last 168 hours.  Invalid input(s): CK  Micro Results Recent Results (from the past 240 hours)  Resp panel by RT-PCR (RSV, Flu A&B, Covid) Anterior Nasal Swab     Status: None   Collection Time: 12/16/23  8:42 AM   Specimen: Anterior Nasal Swab  Result Value Ref Range Status   SARS Coronavirus 2 by RT PCR NEGATIVE NEGATIVE Final   Influenza A by PCR NEGATIVE NEGATIVE Final   Influenza B by PCR NEGATIVE NEGATIVE Final    Comment: (NOTE) The Xpert Xpress SARS-CoV-2/FLU/RSV plus assay is intended as an aid in the diagnosis of influenza from Nasopharyngeal swab specimens and should not be used as a sole basis for treatment. Nasal washings and aspirates are unacceptable for Xpert Xpress SARS-CoV-2/FLU/RSV testing.  Fact Sheet for Patients: BloggerCourse.com  Fact Sheet for Healthcare Providers: SeriousBroker.it  This test is not yet  approved or cleared by the [place]  FDA and has been authorized for detection and/or diagnosis of SARS-CoV-2 by FDA under an Emergency Use Authorization (EUA). This EUA will remain in effect (meaning this test can be used) for the duration of the COVID-19 declaration under Section 564(b)(1) of the Act, 21 U.S.C. section 360bbb-3(b)(1), unless the authorization is terminated or revoked.     Resp Syncytial Virus by PCR NEGATIVE NEGATIVE Final    Comment: (NOTE) Fact Sheet for Patients: BloggerCourse.com  Fact Sheet for Healthcare Providers: SeriousBroker.it  This test is not yet approved or cleared by the [place]  FDA and has been authorized for detection and/or diagnosis of SARS-CoV-2 by FDA under an Emergency Use Authorization (EUA). This EUA will remain in effect (meaning this test can be used) for the duration of the COVID-19 declaration under Section 564(b)(1) of the Act, 21 U.S.C. section 360bbb-3(b)(1), unless the authorization is terminated or revoked.  Performed at [facility], [address], [place], [place] [address]   MRSA Next Gen by PCR, Nasal     Status: None   Collection Time: 12/17/23  9:32 AM   Specimen: Nasal Mucosa; Nasal Swab  Result Value Ref Range Status   MRSA by PCR Next Gen NOT DETECTED NOT DETECTED Final    Comment: (NOTE) The GeneXpert MRSA Assay (FDA approved for NASAL specimens only), is one component of a comprehensive MRSA colonization surveillance program. It is not intended to diagnose MRSA infection nor to guide or monitor treatment for MRSA infections. Test performance is not FDA approved in patients less than 96 years old. Performed at [facility], [address], [place], [place] [address]     Radiology Report CT GUIDED NEEDLE PLACEMENT Result Date: 12/18/2023 CLINICAL DATA:  peripherally enhancing fluid collection adjacent to the right ischial tuberosity,  measuring 2.9 x 2.5 cm on image 84/5. Based on location, this is suspicious for an abscess related to an overlying decubitus ulcer, although no obvious skin defect is seen. No associated cortical destruction to suggest osteomyelitis. EXAM: CT GUIDED CORE BIOPSY OF RIGHT PELVIC MASS ANESTHESIA/SEDATION: lidocaine  1% subcutaneous PROCEDURE: The procedure risks, benefits, and alternatives were explained to the patient. Questions regarding the procedure were encouraged and answered. The patient understands and consents to the procedure. Patient placed prone. Select axial scans of the pelvis were obtained. Soft tissue process inferior to the right ischial tuberosity was localized. The operative field was prepped with chlorhexidinein a sterile fashion, and a sterile drape was applied covering the operative field. A sterile gown and sterile gloves were used for the procedure. Local anesthesia was provided with 1% Lidocaine . Under CT fluoroscopic guidance, 18 gauge needle advanced into the lesion. No fluid could be aspirated. Subsequently, a 17 gauge trocar needle was advanced to the margin of the lesion. Coaxial 18 gauge core biopsy samples were obtained, submitted in saline to surgical pathology. Postprocedure scans show no hemorrhage or other apparent complication. The patient tolerated the procedure well. RADIATION DOSE REDUCTION: This exam was performed according to the departmental dose-optimization program which includes automated exposure control, adjustment of the mA and/or kV according to patient size and/or use of iterative reconstruction technique. COMPLICATIONS: None immediate FINDINGS: 4.1 cm soft tissue attenuation process inferior to the right the ischial tuberosity was localized corresponding to previous imaging. No fluid could be aspirated to suggest abscess. Core biopsy samples of the lesion were obtained as above. IMPRESSION: 1. Technically successful CT-guided core biopsy, right pelvic mass.  Electronically Signed   By: JONETTA Faes M.D.   On: 12/18/2023 16:49     Signature  -   Lavada Stank M.D on 12/19/2023 at 7:46 AM   -  To page go to [email or web address]

## 2023-12-20 DIAGNOSIS — K8689 Other specified diseases of pancreas: Secondary | ICD-10-CM | POA: Diagnosis not present

## 2023-12-20 LAB — MAGNESIUM: Magnesium: 2.1 mg/dL (ref 1.7–2.4)

## 2023-12-20 LAB — C-REACTIVE PROTEIN: CRP: 1.6 mg/dL — ABNORMAL HIGH (ref <1.0)

## 2023-12-20 LAB — COMPREHENSIVE METABOLIC PANEL WITH GFR
ALT: 113 U/L — ABNORMAL HIGH (ref 0–44)
AST: 61 U/L — ABNORMAL HIGH (ref 15–41)
Albumin: 2.3 g/dL — ABNORMAL LOW (ref 3.5–5.0)
Alkaline Phosphatase: 596 U/L — ABNORMAL HIGH (ref 38–126)
Anion gap: 15 (ref 5–15)
BUN: 6 mg/dL — ABNORMAL LOW (ref 8–23)
CO2: 16 mmol/L — ABNORMAL LOW (ref 22–32)
Calcium: 8.1 mg/dL — ABNORMAL LOW (ref 8.9–10.3)
Chloride: 111 mmol/L (ref 98–111)
Creatinine, Ser: 0.36 mg/dL — ABNORMAL LOW (ref 0.44–1.00)
GFR, Estimated: >60 mL/min (ref >60)
Glucose, Bld: 96 mg/dL (ref 70–99)
Potassium: 4 mmol/L (ref 3.5–5.1)
Sodium: 142 mmol/L (ref 135–145)
Total Bilirubin: 2.6 mg/dL — ABNORMAL HIGH (ref 0.0–1.2)
Total Protein: 4.5 g/dL — ABNORMAL LOW (ref 6.5–8.1)

## 2023-12-20 LAB — CBC WITH DIFFERENTIAL/PLATELET
Abs Immature Granulocytes: 0.04 K/uL (ref 0.00–0.07)
Basophils Absolute: 0.1 K/uL (ref 0.0–0.1)
Basophils Relative: 1 %
Eosinophils Absolute: 0.5 K/uL (ref 0.0–0.5)
Eosinophils Relative: 6 %
HCT: 30.8 % — ABNORMAL LOW (ref 36.0–46.0)
Hemoglobin: 10 g/dL — ABNORMAL LOW (ref 12.0–15.0)
Immature Granulocytes: 1 %
Lymphocytes Relative: 19 %
Lymphs Abs: 1.4 K/uL (ref 0.7–4.0)
MCH: 28.8 pg (ref 26.0–34.0)
MCHC: 32.5 g/dL (ref 30.0–36.0)
MCV: 88.8 fL (ref 80.0–100.0)
Monocytes Absolute: 0.8 K/uL (ref 0.1–1.0)
Monocytes Relative: 11 %
Neutro Abs: 4.8 K/uL (ref 1.7–7.7)
Neutrophils Relative %: 62 %
Platelets: 159 K/uL (ref 150–400)
RBC: 3.47 MIL/uL — ABNORMAL LOW (ref 3.87–5.11)
RDW: 17 % — ABNORMAL HIGH (ref 11.5–15.5)
WBC: 7.6 K/uL (ref 4.0–10.5)
nRBC: 0 % (ref 0.0–0.2)

## 2023-12-20 LAB — PROCALCITONIN: Procalcitonin: 0.1 ng/mL

## 2023-12-20 LAB — SURGICAL PATHOLOGY

## 2023-12-20 MED ORDER — LORAZEPAM 0.5 MG PO TABS: 0.5000 mg | ORAL_TABLET | Freq: Every day | ORAL | 0 refills | Status: DC

## 2023-12-20 MED ORDER — PANTOPRAZOLE SODIUM 40 MG PO TBEC: 40.0000 mg | DELAYED_RELEASE_TABLET | Freq: Every day | ORAL | Status: DC

## 2023-12-20 NOTE — Discharge Summary (Signed)
 Natasha Frederick FMW:[id number] DOB: Mar 02, 1956 DOA: 12/16/2023  PCP: Micheal Wolm ORN, MD  Admit date: 12/16/2023  Discharge date: 12/20/2023  Admitted From: SNF   disposition: SNF with hospice   Recommendations for Outpatient Follow-up:   Follow up with PCP in 1-2 weeks  PCP Please obtain BMP/CBC, 2 view CXR in 1week,  (see Discharge instructions)   PCP Please follow up on the following pending results: As desired   Home Health: None Equipment/Devices: None Consultations: GI, IR, palliative care Discharge Condition: Guarded CODE STATUS: DNR Diet Recommendation: Soft diet    Chief Complaint  Patient presents with   Weakness   fatigue     Brief history of present illness from the day of admission and additional interim summary     68 y.o. female with medical history significant of Huntington disease, DVT,  s/p right BKA, endometriosis, arthritis, PUD with prior perforated gastric ulcer requiring Graham patch in 2020, and GERD presents with weakness, nausea, and jaundice.  History is difficult to obtain from patient.   Jaundice onset was noted this morning, prompting her visit. She also reported experiencing abdominal pain, primarily localized to the lower abdomen.  Denies having any fever or chills.                                                                 Hospital Course   Obstructive jaundice likely due to malignant appearing CBD stricture s/p ERCP and stenting of the stricture. Acute.  Patient presents with complaints of abdominal pain and found to have acute onset of jaundice.  Labs significant for alkaline phosphatase 1181, AST 155, ALT 258, ammonia 83, and total bilirubin 7.2.  CT scan of the abdomen and pelvis with contrast noted severe intrahepatic and extrahepatic biliary dilation with moderately  pancreatic duct dilatation and ill-defined low-density mass in the pancreatic neck, new extrinsic enous narrowing at the portal vein/SMV confluence without signs of thrombosis, and 5 mm subpleural right lower lobe which could be possible metastases.  [facility] was consulted.     She underwent ERCP on 12/17/2023 with a malignant appearing stricture found on ERCP in the lower part of CBD, this was stented. Prelim biopsy results from the ERCP suggestive of adenocarcinoma, CA 19.9 extremely elevated ALT suggesting possible underlying cholangiocarcinoma, per husband wants patient to be discharged back to SNF with hospice follow-up, goals of care full comfort.  Kindly involve hospice upon arrival.  Plan was also discussed with husband and palliative care team here.   Right ischial tuberosity mass Acute.  Addendum to CT of the abdomen and pelvis with contrast noted a 2.9 x 2.5 cm enhancing fluid collection concerning for abscess with no associated cortical destruction to suggest osteomyelitis. - IR consulted, upon evaluation by IR no abscess was found but a right pelvic mass  was noted which was biopsied on 12/18/2023.  Kindly follow the results if desired.  Clinically does not seem to have any active infection   Huntington's disease - Delirium precautions - Continue amantadine    S/p right BKA    Peptic ulcer disease GERD PPI  Discharge diagnosis     Principal Problem:   Pancreatic mass Active Problems:   Obstructive jaundice   Abscess   Huntington disease (HCC)   Thrombocytosis   Hx of below knee amputation, right (HCC)   GERD (gastroesophageal reflux disease)   Duodenitis   Bile duct stricture    Discharge instructions    Discharge Instructions     Discharge instructions   Complete by: As directed    Disposition.  Residential hospice Condition.  Guarded CODE STATUS.  DNR Activity.  With assistance as tolerated, full fall precautions. Diet.  Soft with feeding assistance and  aspiration precautions. Goal of care.  Comfort.   No wound care   Complete by: As directed        Discharge Medications   Allergies as of 12/20/2023       Reactions   Bee Venom Anaphylaxis, Swelling   Nsaids Other (See Comments)   PERFORATED ULCER = NO MORE NSAIDs   Adhesive [tape] Other (See Comments)   redness   Codeine Nausea And Vomiting   Penicillins    Did it involve swelling of the face/tongue/throat, SOB, or low BP? Yes Did it involve sudden or severe rash/hives, skin peeling, or any reaction on the inside of your mouth or nose? Unknown Did you need to seek medical attention at a hospital or doctor's office? Yes When did it last happen?   age 40-40    If all above answers are "NO", may proceed with cephalosporin use.   Sulfa Antibiotics Itching        Medication List     STOP taking these medications    Ensure Nutrition Shake Liqd   Myrbetriq  25 MG Tb24 tablet Generic drug: mirabegron  ER   QUEtiapine  25 MG tablet Commonly known as: SEROquel        TAKE these medications    acetaminophen  500 MG tablet Commonly known as: TYLENOL  Take 1,000 mg by mouth 2 (two) times daily as needed for mild pain (pain score 1-3) or moderate pain (pain score 4-6).   amantadine  100 MG capsule Commonly known as: SYMMETREL  Take 100 mg by mouth 2 (two) times daily.   fluorometholone 0.1 % ophthalmic suspension Commonly known as: FML Place 1 drop into both eyes 2 (two) times daily.   Gemtesa  75 MG Tabs Generic drug: Vibegron  Take 1 tablet by mouth daily What changed:  how much to take how to take this when to take this   LORazepam  0.5 MG tablet Commonly known as: ATIVAN  Take 1-2 tablets (0.5-1 mg total) by mouth daily. May take additional dose if needed for anxiety   nystatin  cream Commonly known as: MYCOSTATIN  apply to affected rash twice daily as needed   pantoprazole  40 MG tablet Commonly known as: PROTONIX  Take 1 tablet (40 mg total) by mouth daily.    Restasis  0.05 % ophthalmic emulsion Generic drug: cycloSPORINE  Place 1 drop into both eyes 2 (two) times daily.   risperiDONE  1 MG tablet Commonly known as: RISPERDAL  Take 1 mg by mouth daily.   sertraline  100 MG tablet Commonly known as: ZOLOFT  TAKE 1 TABLET(100 MG) BY MOUTH DAILY          Major procedures and Radiology Reports - PLEASE  review detailed and final reports thoroughly  -       CT GUIDED NEEDLE PLACEMENT Result Date: 12/18/2023 CLINICAL DATA:  peripherally enhancing fluid collection adjacent to the right ischial tuberosity, measuring 2.9 x 2.5 cm on image 84/5. Based on location, this is suspicious for an abscess related to an overlying decubitus ulcer, although no obvious skin defect is seen. No associated cortical destruction to suggest osteomyelitis. EXAM: CT GUIDED CORE BIOPSY OF RIGHT PELVIC MASS ANESTHESIA/SEDATION: lidocaine  1% subcutaneous PROCEDURE: The procedure risks, benefits, and alternatives were explained to the patient. Questions regarding the procedure were encouraged and answered. The patient understands and consents to the procedure. Patient placed prone. Select axial scans of the pelvis were obtained. Soft tissue process inferior to the right ischial tuberosity was localized. The operative field was prepped with chlorhexidinein a sterile fashion, and a sterile drape was applied covering the operative field. A sterile gown and sterile gloves were used for the procedure. Local anesthesia was provided with 1% Lidocaine . Under CT fluoroscopic guidance, 18 gauge needle advanced into the lesion. No fluid could be aspirated. Subsequently, a 17 gauge trocar needle was advanced to the margin of the lesion. Coaxial 18 gauge core biopsy samples were obtained, submitted in saline to surgical pathology. Postprocedure scans show no hemorrhage or other apparent complication. The patient tolerated the procedure well. RADIATION DOSE REDUCTION: This exam was performed according  to the departmental dose-optimization program which includes automated exposure control, adjustment of the mA and/or kV according to patient size and/or use of iterative reconstruction technique. COMPLICATIONS: None immediate FINDINGS: 4.1 cm soft tissue attenuation process inferior to the right the ischial tuberosity was localized corresponding to previous imaging. No fluid could be aspirated to suggest abscess. Core biopsy samples of the lesion were obtained as above. IMPRESSION: 1. Technically successful CT-guided core biopsy, right pelvic mass. Electronically Signed   By: JONETTA Faes M.D.   On: 12/18/2023 16:49   CT ABDOMEN PELVIS W CONTRAST Addendum Date: 12/16/2023 ADDENDUM REPORT: 12/16/2023 13:17 ADDENDUM: Not mentioned in the original report is a peripherally enhancing fluid collection adjacent to the right ischial tuberosity, measuring 2.9 x 2.5 cm on image 84/5. Based on location, this is suspicious for an abscess related to an overlying decubitus ulcer, although no obvious skin defect is seen. No associated cortical destruction to suggest osteomyelitis. Electronically Signed   By: Elsie Perone M.D.   On: 12/16/2023 13:17   Result Date: 12/16/2023 CLINICAL DATA:  Abdominal pain, acute, nonlocalized abd pain, weakness, new jaundice. * Tracking Code: BO * EXAM: CT ABDOMEN AND PELVIS WITH CONTRAST TECHNIQUE: Multidetector CT imaging of the abdomen and pelvis was performed using the standard protocol following bolus administration of intravenous contrast. RADIATION DOSE REDUCTION: This exam was performed according to the departmental dose-optimization program which includes automated exposure control, adjustment of the mA and/or kV according to patient size and/or use of iterative reconstruction technique. CONTRAST:  75mL OMNIPAQUE  IOHEXOL  350 MG/ML SOLN COMPARISON:  Abdominopelvic CT 07/30/2018.  Chest CT 07/14/2021. FINDINGS: Technical note: Despite efforts by the technologist and patient, mild motion  artifact is present on today's exam and could not be eliminated. This reduces exam sensitivity and specificity. Lower chest: 5 mm subpleural right lower lobe nodule on image 3/7 appears new from previous chest CT. The lung bases are otherwise clear. No significant pleural or pericardial effusion. Hepatobiliary: The liver has a non cirrhotic morphology without focal parenchymal abnormality. However, there is new severe diffuse intrahepatic biliary dilatation associated with moderate  distention of the gallbladder. The common hepatic duct is dilated to 2.3 cm. There is focal narrowing of the common bile duct above the pancreatic head with suspected ductal wall enhancement. Pancreas: New moderate dilatation of the pancreatic duct within the pancreatic tail. There is an ill-defined low-density mass within the pancreatic neck, measuring 1.6 x 1.3 cm on image 28/5. Findings are highly concerning for pancreatic malignancy with associated double duct sign. Spleen: New contour irregularity and nodularity of the spleen without focally suspicious parenchymal lesion. Adrenals/Urinary Tract: Both adrenal glands appear normal. No evidence of urinary tract calculus, suspicious renal lesion or hydronephrosis. The bladder appears unremarkable for its degree of distention. Stomach/Bowel: No enteric contrast administered. The stomach appears unremarkable for its degree of distension. No evidence of bowel wall thickening, distention or surrounding inflammatory change. There is a large amount of stool within the rectum. Vascular/Lymphatic: No enlarged abdominopelvic lymph nodes are identified. There is aortic and branch vessel atherosclerosis without evidence of aneurysm or large vessel occlusion. New extrinsic venous narrowing at the portal vein/SMV confluence without evidence of acute thrombosis. Reproductive: The uterus and ovaries appear unremarkable. No evidence of adnexal mass. Tubal ligation clips noted on the right. Other:  Previously demonstrated ascites has resolved. No peritoneal nodularity or pneumoperitoneum identified. Musculoskeletal: No acute or significant osseous findings. Status post left total hip arthroplasty and 2 level lumbar fusion. There is a convex left thoracolumbar scoliosis with multilevel spondylosis. Moderate right hip degenerative changes are noted. Old fractures of the right pubic rami. IMPRESSION: 1. New severe intrahepatic and extrahepatic biliary dilatation with moderate pancreatic ductal dilatation an ill-defined low-density mass in the pancreatic neck, highly concerning for pancreatic malignancy. 2. New extrinsic venous narrowing at the portal vein/SMV confluence without evidence of acute thrombosis, worrisome for tumor unresectability. 3. New contour irregularity and nodularity of the spleen without focally suspicious parenchymal lesion. 4. New 5 mm subpleural right lower lobe pulmonary nodule, nonspecific, potentially a metastasis. Consider further evaluation full chest CT. No evidence of distant metastatic disease in the abdomen or pelvis. 5.  Aortic Atherosclerosis (ICD10-I70.0). Electronically Signed: By: Elsie Perone M.D. On: 12/16/2023 10:26   CT Head Wo Contrast Result Date: 12/16/2023 EXAM: CT HEAD WITHOUT CONTRAST 12/16/2023 09:57:34 AM TECHNIQUE: CT of the head was performed without the administration of intravenous contrast. Automated exposure control, iterative reconstruction, and/or weight based adjustment of the mA/kV was utilized to reduce the radiation dose to as low as reasonably achievable. COMPARISON: 03/31/2001 CLINICAL HISTORY: Delirium. FINDINGS: BRAIN AND VENTRICLES: Moderate generalized cerebral and cerebellar volume loss. Chronic encephalomalacic changes within the frontal lobes bilaterally and the left cerebellar hemisphere. Mild-to-moderate periventricular white matter disease. No acute hemorrhage. No evidence of acute infarct. No hydrocephalus. No extra-axial collection.  No mass effect or midline shift. ORBITS: No acute abnormality. SINUSES: No acute abnormality. SOFT TISSUES AND SKULL: No acute soft tissue abnormality. No skull fracture. Calcifications within the carotid siphons. IMPRESSION: 1. No acute intracranial abnormality. 2. Chronic encephalomalacic changes within the frontal lobes bilaterally and the left cerebellar hemisphere. 3. Mild-to-moderate periventricular white matter disease. Electronically signed by: Evalene Coho MD 12/16/2023 10:09 AM EDT RP Workstation: [id number]   DG Chest Portable 1 View Result Date: 12/16/2023 CLINICAL DATA:  Cough. EXAM: PORTABLE CHEST 1 VIEW COMPARISON:  07/03/2021. FINDINGS: Bilateral lung fields are clear. Linear calcifications noted along the left diaphragmatic pleura, nonspecific but commonly seen with asbestos related disease. Bilateral costophrenic angles are clear. No pneumothorax. Stable cardio-mediastinal silhouette. There is unfolding of aortic arch. No  acute osseous abnormalities. The soft tissues are within normal limits. IMPRESSION: No active disease. Electronically Signed   By: Ree Molt M.D.   On: 12/16/2023 09:02    Micro Results     Recent Results (from the past 240 hours)  Resp panel by RT-PCR (RSV, Flu A&B, Covid) Anterior Nasal Swab     Status: None   Collection Time: 12/16/23  8:42 AM   Specimen: Anterior Nasal Swab  Result Value Ref Range Status   SARS Coronavirus 2 by RT PCR NEGATIVE NEGATIVE Final   Influenza A by PCR NEGATIVE NEGATIVE Final   Influenza B by PCR NEGATIVE NEGATIVE Final    Comment: (NOTE) The Xpert Xpress SARS-CoV-2/FLU/RSV plus assay is intended as an aid in the diagnosis of influenza from Nasopharyngeal swab specimens and should not be used as a sole basis for treatment. Nasal washings and aspirates are unacceptable for Xpert Xpress SARS-CoV-2/FLU/RSV testing.  Fact Sheet for Patients: BloggerCourse.com  Fact Sheet for Healthcare  Providers: SeriousBroker.it  This test is not yet approved or cleared by the [place]  FDA and has been authorized for detection and/or diagnosis of SARS-CoV-2 by FDA under an Emergency Use Authorization (EUA). This EUA will remain in effect (meaning this test can be used) for the duration of the COVID-19 declaration under Section 564(b)(1) of the Act, 21 U.S.C. section 360bbb-3(b)(1), unless the authorization is terminated or revoked.     Resp Syncytial Virus by PCR NEGATIVE NEGATIVE Final    Comment: (NOTE) Fact Sheet for Patients: BloggerCourse.com  Fact Sheet for Healthcare Providers: SeriousBroker.it  This test is not yet approved or cleared by the [place]  FDA and has been authorized for detection and/or diagnosis of SARS-CoV-2 by FDA under an Emergency Use Authorization (EUA). This EUA will remain in effect (meaning this test can be used) for the duration of the COVID-19 declaration under Section 564(b)(1) of the Act, 21 U.S.C. section 360bbb-3(b)(1), unless the authorization is terminated or revoked.  Performed at [facility], [address] [place], [place] [address]   MRSA Next Gen by PCR, Nasal     Status: None   Collection Time: 12/17/23  9:32 AM   Specimen: Nasal Mucosa; Nasal Swab  Result Value Ref Range Status   MRSA by PCR Next Gen NOT DETECTED NOT DETECTED Final    Comment: (NOTE) The GeneXpert MRSA Assay (FDA approved for NASAL specimens only), is one component of a comprehensive MRSA colonization surveillance program. It is not intended to diagnose MRSA infection nor to guide or monitor treatment for MRSA infections. Test performance is not FDA approved in patients less than 65 years old. Performed at [facility], [address], [place], [place] [address]   Aerobic/Anaerobic Culture w Gram Stain (surgical/deep wound)     Status: None (Preliminary result)    Collection Time: 12/18/23  3:08 PM   Specimen: Skin, Cyst/Tag/Debridement; Tissue  Result Value Ref Range Status   Specimen Description SKIN  Final   Special Requests CORE RIGHT  Final   Gram Stain   Final    NO WBC SEEN NO ORGANISMS SEEN Performed at [facility], [address], [place], [place] [address]    Culture PENDING  Incomplete   Report Status PENDING  Incomplete    Today   Subjective    Yoselyn Mcglade today has no headache,no chest abdominal pain,no new weakness tingling or numbness, feels much better    Objective   Blood pressure (!) 111/91, pulse  78, temperature 97.9 F (36.6 C), temperature source Oral, resp. rate (!) 21, height 5' 5 (1.651 m), weight 47.6 kg, SpO2 100%.   Intake/Output Summary (Last 24 hours) at 12/20/2023 0721 Last data filed at 12/19/2023 1900 Gross per 24 hour  Intake 180 ml  Output --  Net 180 ml    Exam  Awake Alert, No new F.N deficits,    [place].AT,PERRAL Supple Neck,   Symmetrical Chest wall movement, Good air movement bilaterally, CTAB RRR,No Gallops,   +ve B.Sounds, Abd Soft, Non tender,  Right BKA   Data Review   Recent Labs  Lab 12/16/23 0842 12/16/23 0913 12/17/23 0533 12/17/23 1607 12/18/23 0346 12/19/23 0215 12/20/23 0259  WBC 9.7  --  11.8* 12.5* 9.5 9.4 7.6  HGB 12.5   < > 12.8 11.1* 10.9* 10.2* 10.0*  HCT 38.2   < > 38.9 33.7* 33.5* 31.0* 30.8*  PLT 432*  --  469* 430* 362 356 159  MCV 88.8  --  87.8 88.7 88.6 86.6 88.8  MCH 29.1  --  28.9 29.2 28.8 28.5 28.8  MCHC 32.7  --  32.9 32.9 32.5 32.9 32.5  RDW 16.7*  --  16.6* 16.8* 16.9* 16.5* 17.0*  LYMPHSABS 1.1  --   --   --  1.4 1.2 1.4  MONOABS 0.7  --   --   --  0.8 0.8 0.8  EOSABS 0.2  --   --   --  0.0 0.3 0.5  BASOSABS 0.1  --   --   --  0.0 0.1 0.1   < > = values in this interval not displayed.    Recent Labs  Lab 12/16/23 0842 12/16/23 0913 12/17/23 0533 12/17/23 0550 12/18/23 0346 12/19/23 0215 12/20/23 0259  NA 139 141 139  --   142 140 142  K 3.5 3.5 3.5  --  3.6 2.9* 4.0  CL 104 106 104  --  111 109 111  CO2 21*  --  23  --  18* 19* 16*  ANIONGAP 14  --  12  --  13 12 15   GLUCOSE 126* 123* 106*  --  123* 90 96  BUN 12 14 7*  --  10 6* 6*  CREATININE 0.44 0.40* 0.44  --  0.42* 0.41* 0.36*  AST 155*  --  150*  --  72* 39 61*  ALT 258*  --  239*  --  172* 120* 113*  ALKPHOS 1,181*  --  1,155*  --  852* 675* 596*  BILITOT 7.2*  --  10.8*  --  3.6* 3.0* 2.6*  ALBUMIN  3.0*  --  3.0*  --  2.6* 2.4* 2.3*  CRP  --   --  8.4*  --  6.3* 3.6* 1.6*  PROCALCITON  --   --  0.16  --  0.16 0.15  --   INR 1.0  --   --  1.0 1.0  --   --   AMMONIA 83*  --   --   --   --   --   --   MG 1.9  --   --   --  1.6* 1.5* 2.1  CALCIUM  9.9  --  9.5  --  8.9 8.4* 8.1*    Total Time in preparing paper work, data evaluation and todays exam - 35 minutes  Signature  -    Lavada Stank M.D on 12/20/2023 at 7:21 AM   -  To page go to [email or web address]

## 2023-12-20 NOTE — Plan of Care (Signed)

## 2023-12-20 NOTE — NC FL2 (Addendum)
 [place]  MEDICAID [id number] LEVEL OF CARE FORM     IDENTIFICATION  Patient Name: Natasha Frederick Birthdate: [birth year] Sex: female Admission Date (Current Location): 12/16/2023  [facility] and [place] Number:  Producer, television/film/video and Address:  [facility] [place], [address] [place], [place] [address]      Provider Number:    Attending Physician Name and Address:  Dennise Lavada POUR, MD  Relative Name and Phone Number:       Current Level of Care: Hospital Recommended Level of Care:   Prior Approval Number:    Date Approved/Denied:   PASRR Number:    Discharge Plan:      Current Diagnoses: Patient Active Problem List   Diagnosis Date Noted   Duodenitis 12/17/2023   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   Pancreatic mass 12/16/2023   Obstructive jaundice 12/16/2023   Abscess 12/16/2023   Thrombocytosis 12/16/2023   Hx of below knee amputation, right (HCC) 12/16/2023   GERD (gastroesophageal reflux disease) 11/27/2021   Osteoporosis 11/23/2021   Depression, recurrent (HCC) 05/12/2019   Below-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) 01/26/2019   Anxiety and depression 12/21/2018   Chronic back pain 11/05/2018   Cold right foot 08/06/2018   Delirium 08/01/2018   Perforated gastric ulcer s/p omental patch 07/30/2018 07/31/2018   ARF (acute renal failure) (HCC) 07/30/2018   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   Primary osteoarthritis of left hip 07/19/2014   Scoliosis of lumbar spine 01/05/2014    Orientation RESPIRATION BLADDER Height & Weight     Self  Normal Incontinent Weight: 105 lb (47.6 kg) Height:  5' 5 (165.1 cm)  BEHAVIORAL SYMPTOMS/MOOD NEUROLOGICAL BOWEL NUTRITION STATUS      Incontinent Diet (Soft diet with assistance)  AMBULATORY STATUS COMMUNICATION OF NEEDS Skin   Extensive Assist Verbally Surgical wounds (incision right lumbar)                       Personal Care Assistance Level of Assistance  Bathing, Feeding, Dressing  Bathing Assistance: Maximum assistance Feeding assistance: Limited assistance Dressing Assistance: Maximum assistance     Functional Limitations Info  Sight, Hearing, Speech          SPECIAL CARE FACTORS FREQUENCY                       Contractures Contractures Info: Not present    Additional Factors Info  Code Status, Allergies Code Status Info: DNR Allergies Info: bee venom, NSAIDS, tape(adhesive), codeine, penicillins, sulfa antibiotics            Discharge Medications: Medication List       STOP taking these medications     Ensure Nutrition Shake Liqd    Myrbetriq  25 MG Tb24 tablet Generic drug: mirabegron  ER    QUEtiapine  25 MG tablet Commonly known as: SEROquel            TAKE these medications     acetaminophen  500 MG tablet Commonly known as: TYLENOL  Take 1,000 mg by mouth 2 (two) times daily as needed for mild pain (pain score 1-3) or moderate pain (pain score 4-6).    amantadine  100 MG capsule Commonly known as: SYMMETREL  Take 100 mg by mouth 2 (two) times daily.    fluorometholone 0.1 % ophthalmic suspension Commonly known as: FML Place 1 drop into both eyes 2 (two) times daily.    Gemtesa  75 MG Tabs Generic drug: Vibegron  Take 1 tablet by mouth daily  What changed:  how much to take how to take this when to take this    LORazepam  0.5 MG tablet Commonly known as: ATIVAN  Take 1-2 tablets (0.5-1 mg total) by mouth daily. May take additional dose if needed for anxiety    nystatin  cream Commonly known as: MYCOSTATIN  apply to affected rash twice daily as needed    pantoprazole  40 MG tablet Commonly known as: PROTONIX  Take 1 tablet (40 mg total) by mouth daily.    Restasis  0.05 % ophthalmic emulsion Generic drug: cycloSPORINE  Place 1 drop into both eyes 2 (two) times daily.    risperiDONE  1 MG tablet Commonly known as: RISPERDAL  Take 1 mg by mouth daily.    sertraline  100 MG tablet Commonly known as: ZOLOFT  TAKE 1  TABLET(100 MG) BY MOUTH DAILY   Relevant Imaging Results:  Relevant Lab Results:   Additional Information SS # [id number] Hospice  [address] [place], [place]

## 2023-12-20 NOTE — Plan of Care (Signed)
 Palliative:  Chart reviewed. Discussed in brief with Dr. Dennise. Plans for return to [facility] with hospice support today.   No charge  [name], NP Palliative Medicine Team Pager [phone] (Please see [email or web address] for schedule) Team Phone [phone]

## 2023-12-20 NOTE — Progress Notes (Signed)
 Report given to Senita,RN of [facility].

## 2023-12-20 NOTE — Discharge Instructions (Addendum)
Disposition.  Residential hospice °Condition.  Guarded °CODE STATUS.  DNR °Activity.  With assistance as tolerated, full fall precautions. °Diet.  Soft with feeding assistance and aspiration precautions. °Goal of care.  Comfort. ° °

## 2023-12-20 NOTE — TOC Transition Note (Signed)
 Transition of Care [facility]) - Discharge Note   Patient Details  Name: Natasha Frederick MRN: [id number] Date of Birth: [birth year]  Transition of Care [facility] CM/SW Contact:  Luann SHAUNNA Cumming, LCSW Phone Number: 12/20/2023, 11:12 AM   Clinical Narrative:      CSW called [facility] and confirmed pt can return with hospice; they contract with [facility]. CSW faxed DC summary and fl2 to [facility].  CSW confirmed with spouse plan for return to [facility].  DC packet on chart. Ambulance transport requested for patient. RN to call report to [phone].  CSW will sign off for now as social work intervention is no longer needed. Please consult us  again if new needs arise.    Final next level of care: Skilled Nursing Facility Barriers to Discharge: No Barriers Identified     Discharge Placement                Patient to be transferred to facility by: [facility] Name of family member notified: Husband Ezzard Hover Patient and family notified of of transfer: 12/20/23  Discharge Plan and Services Additional resources added to the After Visit Summary for   In-house Referral: Clinical Social Work, Hospice / Palliative Care                                   Social Drivers of Health (SDOH) Interventions SDOH Screenings   Food Insecurity: No Food Insecurity (12/16/2023)  Housing: Low Risk  (12/16/2023)  Transportation Needs: No Transportation Needs (12/16/2023)  Utilities: Not At Risk (12/16/2023)  Alcohol Screen: Low Risk  (09/26/2022)  Depression (PHQ2-9): Low Risk  (09/26/2022)  Financial Resource Strain: Low Risk  (09/21/2021)  Physical Activity: Insufficiently Active (09/26/2022)  Social Connections: Unknown (12/16/2023)  Stress: No Stress Concern Present (09/26/2022)  Tobacco Use: Medium Risk (12/17/2023)     Readmission Risk Interventions     No data to display

## 2023-12-20 NOTE — Plan of Care (Signed)
  Problem: Health Behavior/Discharge Planning: Goal: Ability to manage health-related needs will improve Outcome: Progressing   Problem: Activity: Goal: Risk for activity intolerance will decrease Outcome: Progressing   Problem: Nutrition: Goal: Adequate nutrition will be maintained Outcome: Progressing   Problem: Safety: Goal: Ability to remain free from injury will improve Outcome: Progressing   Problem: Skin Integrity: Goal: Risk for impaired skin integrity will decrease Outcome: Progressing   

## 2023-12-20 NOTE — Care Management Important Message (Signed)
 Important Message  Patient Details  Name: Natasha Frederick MRN: [id number] Date of Birth: 1955-12-09   Important Message Given:  Yes - Medicare IM     Claretta Deed 12/20/2023, 8:11 AM

## 2023-12-23 ENCOUNTER — Emergency Department (HOSPITAL_COMMUNITY)

## 2023-12-23 ENCOUNTER — Inpatient Hospital Stay (HOSPITAL_COMMUNITY)
Admission: EM | Admit: 2023-12-23 | Discharge: 2023-12-25 | DRG: 871 | Disposition: A | Discharge status: Skilled Nursing Facility | Source: Skilled Nursing Facility | Attending: Student | Admitting: Student

## 2023-12-23 ENCOUNTER — Other Ambulatory Visit: Payer: Self-pay

## 2023-12-23 ENCOUNTER — Encounter (HOSPITAL_COMMUNITY): Payer: Self-pay

## 2023-12-23 DIAGNOSIS — G9389 Other specified disorders of brain: Secondary | ICD-10-CM | POA: Diagnosis not present

## 2023-12-23 DIAGNOSIS — K5900 Constipation, unspecified: Secondary | ICD-10-CM | POA: Diagnosis present

## 2023-12-23 DIAGNOSIS — Z885 Allergy status to narcotic agent status: Secondary | ICD-10-CM

## 2023-12-23 DIAGNOSIS — A419 Sepsis, unspecified organism: Secondary | ICD-10-CM | POA: Diagnosis not present

## 2023-12-23 DIAGNOSIS — Z91030 Bee allergy status: Secondary | ICD-10-CM

## 2023-12-23 DIAGNOSIS — R6520 Severe sepsis without septic shock: Secondary | ICD-10-CM | POA: Diagnosis present

## 2023-12-23 DIAGNOSIS — Z7401 Bed confinement status: Secondary | ICD-10-CM | POA: Diagnosis not present

## 2023-12-23 DIAGNOSIS — E872 Acidosis, unspecified: Secondary | ICD-10-CM | POA: Diagnosis present

## 2023-12-23 DIAGNOSIS — E46 Unspecified protein-calorie malnutrition: Secondary | ICD-10-CM | POA: Diagnosis present

## 2023-12-23 DIAGNOSIS — S88111S Complete traumatic amputation at level between knee and ankle, right lower leg, sequela: Secondary | ICD-10-CM | POA: Diagnosis not present

## 2023-12-23 DIAGNOSIS — S0990XA Unspecified injury of head, initial encounter: Secondary | ICD-10-CM | POA: Diagnosis not present

## 2023-12-23 DIAGNOSIS — R0602 Shortness of breath: Secondary | ICD-10-CM | POA: Diagnosis not present

## 2023-12-23 DIAGNOSIS — Z860100 Personal history of colon polyps, unspecified: Secondary | ICD-10-CM

## 2023-12-23 DIAGNOSIS — F0390 Unspecified dementia without behavioral disturbance: Secondary | ICD-10-CM | POA: Diagnosis not present

## 2023-12-23 DIAGNOSIS — Z1152 Encounter for screening for COVID-19: Secondary | ICD-10-CM | POA: Diagnosis not present

## 2023-12-23 DIAGNOSIS — K8689 Other specified diseases of pancreas: Secondary | ICD-10-CM | POA: Diagnosis not present

## 2023-12-23 DIAGNOSIS — C259 Malignant neoplasm of pancreas, unspecified: Secondary | ICD-10-CM | POA: Diagnosis present

## 2023-12-23 DIAGNOSIS — K219 Gastro-esophageal reflux disease without esophagitis: Secondary | ICD-10-CM | POA: Diagnosis present

## 2023-12-23 DIAGNOSIS — K5730 Diverticulosis of large intestine without perforation or abscess without bleeding: Secondary | ICD-10-CM | POA: Diagnosis not present

## 2023-12-23 DIAGNOSIS — M4322 Fusion of spine, cervical region: Secondary | ICD-10-CM | POA: Diagnosis not present

## 2023-12-23 DIAGNOSIS — C801 Malignant (primary) neoplasm, unspecified: Secondary | ICD-10-CM

## 2023-12-23 DIAGNOSIS — Z86718 Personal history of other venous thrombosis and embolism: Secondary | ICD-10-CM | POA: Diagnosis not present

## 2023-12-23 DIAGNOSIS — G9341 Metabolic encephalopathy: Secondary | ICD-10-CM | POA: Diagnosis present

## 2023-12-23 DIAGNOSIS — Z993 Dependence on wheelchair: Secondary | ICD-10-CM

## 2023-12-23 DIAGNOSIS — Z91048 Other nonmedicinal substance allergy status: Secondary | ICD-10-CM

## 2023-12-23 DIAGNOSIS — Z87891 Personal history of nicotine dependence: Secondary | ICD-10-CM

## 2023-12-23 DIAGNOSIS — E876 Hypokalemia: Secondary | ICD-10-CM | POA: Diagnosis present

## 2023-12-23 DIAGNOSIS — Z801 Family history of malignant neoplasm of trachea, bronchus and lung: Secondary | ICD-10-CM

## 2023-12-23 DIAGNOSIS — K7200 Acute and subacute hepatic failure without coma: Secondary | ICD-10-CM | POA: Diagnosis not present

## 2023-12-23 DIAGNOSIS — G10 Huntington's disease: Secondary | ICD-10-CM | POA: Diagnosis not present

## 2023-12-23 DIAGNOSIS — K831 Obstruction of bile duct: Secondary | ICD-10-CM | POA: Diagnosis present

## 2023-12-23 DIAGNOSIS — R9431 Abnormal electrocardiogram [ECG] [EKG]: Secondary | ICD-10-CM | POA: Diagnosis present

## 2023-12-23 DIAGNOSIS — Z96642 Presence of left artificial hip joint: Secondary | ICD-10-CM | POA: Diagnosis not present

## 2023-12-23 DIAGNOSIS — Z886 Allergy status to analgesic agent status: Secondary | ICD-10-CM

## 2023-12-23 DIAGNOSIS — R531 Weakness: Secondary | ICD-10-CM | POA: Diagnosis not present

## 2023-12-23 DIAGNOSIS — Z8711 Personal history of peptic ulcer disease: Secondary | ICD-10-CM | POA: Diagnosis not present

## 2023-12-23 DIAGNOSIS — D630 Anemia in neoplastic disease: Secondary | ICD-10-CM | POA: Diagnosis present

## 2023-12-23 DIAGNOSIS — Z4682 Encounter for fitting and adjustment of non-vascular catheter: Secondary | ICD-10-CM | POA: Diagnosis not present

## 2023-12-23 DIAGNOSIS — S88111A Complete traumatic amputation at level between knee and ankle, right lower leg, initial encounter: Secondary | ICD-10-CM | POA: Diagnosis present

## 2023-12-23 DIAGNOSIS — Z882 Allergy status to sulfonamides status: Secondary | ICD-10-CM

## 2023-12-23 DIAGNOSIS — Z9889 Other specified postprocedural states: Secondary | ICD-10-CM | POA: Diagnosis not present

## 2023-12-23 DIAGNOSIS — Z820 Family history of epilepsy and other diseases of the nervous system: Secondary | ICD-10-CM

## 2023-12-23 DIAGNOSIS — C3490 Malignant neoplasm of unspecified part of unspecified bronchus or lung: Secondary | ICD-10-CM | POA: Diagnosis not present

## 2023-12-23 DIAGNOSIS — Z981 Arthrodesis status: Secondary | ICD-10-CM

## 2023-12-23 DIAGNOSIS — Z66 Do not resuscitate: Secondary | ICD-10-CM | POA: Diagnosis present

## 2023-12-23 DIAGNOSIS — S199XXA Unspecified injury of neck, initial encounter: Secondary | ICD-10-CM | POA: Diagnosis not present

## 2023-12-23 DIAGNOSIS — R067 Sneezing: Secondary | ICD-10-CM | POA: Diagnosis not present

## 2023-12-23 DIAGNOSIS — Z89511 Acquired absence of right leg below knee: Secondary | ICD-10-CM | POA: Diagnosis not present

## 2023-12-23 DIAGNOSIS — Z515 Encounter for palliative care: Secondary | ICD-10-CM | POA: Diagnosis not present

## 2023-12-23 DIAGNOSIS — R140 Abdominal distension (gaseous): Secondary | ICD-10-CM | POA: Diagnosis not present

## 2023-12-23 DIAGNOSIS — M47812 Spondylosis without myelopathy or radiculopathy, cervical region: Secondary | ICD-10-CM | POA: Diagnosis not present

## 2023-12-23 DIAGNOSIS — Z823 Family history of stroke: Secondary | ICD-10-CM

## 2023-12-23 DIAGNOSIS — R1111 Vomiting without nausea: Secondary | ICD-10-CM | POA: Diagnosis not present

## 2023-12-23 DIAGNOSIS — M8580 Other specified disorders of bone density and structure, unspecified site: Secondary | ICD-10-CM | POA: Diagnosis not present

## 2023-12-23 DIAGNOSIS — Z7189 Other specified counseling: Secondary | ICD-10-CM | POA: Diagnosis not present

## 2023-12-23 DIAGNOSIS — Z880 Allergy status to penicillin: Secondary | ICD-10-CM

## 2023-12-23 DIAGNOSIS — K828 Other specified diseases of gallbladder: Secondary | ICD-10-CM | POA: Diagnosis not present

## 2023-12-23 DIAGNOSIS — Z79899 Other long term (current) drug therapy: Secondary | ICD-10-CM

## 2023-12-23 DIAGNOSIS — R4182 Altered mental status, unspecified: Secondary | ICD-10-CM | POA: Diagnosis not present

## 2023-12-23 LAB — CBC
HCT: 34.2 % — ABNORMAL LOW (ref 36.0–46.0)
Hemoglobin: 11.2 g/dL — ABNORMAL LOW (ref 12.0–15.0)
MCH: 29.6 pg (ref 26.0–34.0)
MCHC: 32.7 g/dL (ref 30.0–36.0)
MCV: 90.2 fL (ref 80.0–100.0)
Platelets: 360 K/uL (ref 150–400)
RBC: 3.79 MIL/uL — ABNORMAL LOW (ref 3.87–5.11)
RDW: 17.2 % — ABNORMAL HIGH (ref 11.5–15.5)
WBC: 18.8 K/uL — ABNORMAL HIGH (ref 4.0–10.5)
nRBC: 0 % (ref 0.0–0.2)

## 2023-12-23 LAB — COMPREHENSIVE METABOLIC PANEL WITH GFR
ALT: 193 U/L — ABNORMAL HIGH (ref 0–44)
AST: 183 U/L — ABNORMAL HIGH (ref 15–41)
Albumin: 2.8 g/dL — ABNORMAL LOW (ref 3.5–5.0)
Alkaline Phosphatase: 910 U/L — ABNORMAL HIGH (ref 38–126)
Anion gap: 14 (ref 5–15)
BUN: 7 mg/dL — ABNORMAL LOW (ref 8–23)
CO2: 20 mmol/L — ABNORMAL LOW (ref 22–32)
Calcium: 8.9 mg/dL (ref 8.9–10.3)
Chloride: 104 mmol/L (ref 98–111)
Creatinine, Ser: 0.45 mg/dL (ref 0.44–1.00)
GFR, Estimated: >60 mL/min (ref >60)
Glucose, Bld: 126 mg/dL — ABNORMAL HIGH (ref 70–99)
Potassium: 3.3 mmol/L — ABNORMAL LOW (ref 3.5–5.1)
Sodium: 138 mmol/L (ref 135–145)
Total Bilirubin: 5.7 mg/dL — ABNORMAL HIGH (ref 0.0–1.2)
Total Protein: 5.8 g/dL — ABNORMAL LOW (ref 6.5–8.1)

## 2023-12-23 LAB — RESP PANEL BY RT-PCR (RSV, FLU A&B, COVID)  RVPGX2
Influenza A by PCR: NEGATIVE
Influenza B by PCR: NEGATIVE
Resp Syncytial Virus by PCR: NEGATIVE
SARS Coronavirus 2 by RT PCR: NEGATIVE

## 2023-12-23 LAB — I-STAT CG4 LACTIC ACID, ED
Lactic Acid, Venous: 2.2 mmol/L (ref 0.5–1.9)
Lactic Acid, Venous: 1.1 mmol/L (ref 0.5–1.9)

## 2023-12-23 LAB — LIPASE, BLOOD: Lipase: 43 U/L (ref 11–51)

## 2023-12-23 MED ORDER — CEFTRIAXONE SODIUM 1 G IJ SOLR
1.0000 g | Freq: Once | INTRAMUSCULAR | Status: AC
  Administered 2023-12-23: 1 g via INTRAVENOUS
  Filled 2023-12-23: qty 10

## 2023-12-23 MED ORDER — VANCOMYCIN HCL IN DEXTROSE 1-5 GM/200ML-% IV SOLN
1000.0000 mg | Freq: Once | INTRAVENOUS | Status: AC
  Administered 2023-12-23: 1000 mg via INTRAVENOUS
  Filled 2023-12-23: qty 200

## 2023-12-23 MED ORDER — VANCOMYCIN HCL 750 MG/150ML IV SOLN
750.0000 mg | INTRAVENOUS | Status: DC
  Filled 2023-12-23: qty 150

## 2023-12-23 MED ORDER — LACTATED RINGERS IV BOLUS
1000.0000 mL | Freq: Once | INTRAVENOUS | Status: AC
  Administered 2023-12-23: 1000 mL via INTRAVENOUS

## 2023-12-23 MED ORDER — CYCLOSPORINE 0.05 % OP EMUL
1.0000 [drp] | Freq: Two times a day (BID) | OPHTHALMIC | Status: DC
  Administered 2023-12-24 – 2023-12-25 (×4): 1 [drp] via OPHTHALMIC
  Filled 2023-12-23 (×6): qty 30

## 2023-12-23 MED ORDER — DOCUSATE SODIUM 100 MG PO CAPS
100.0000 mg | ORAL_CAPSULE | Freq: Two times a day (BID) | ORAL | Status: DC
  Administered 2023-12-24 – 2023-12-25 (×3): 100 mg via ORAL
  Filled 2023-12-23 (×3): qty 1

## 2023-12-23 MED ORDER — RISPERIDONE 1 MG PO TABS
1.0000 mg | ORAL_TABLET | Freq: Every day | ORAL | Status: DC
  Administered 2023-12-24 – 2023-12-25 (×2): 1 mg via ORAL
  Filled 2023-12-23 (×2): qty 1

## 2023-12-23 MED ORDER — BISACODYL 10 MG RE SUPP: 10.0000 mg | Freq: Every day | RECTAL | Status: DC | PRN

## 2023-12-23 MED ORDER — SODIUM CHLORIDE 0.9 % IV SOLN
2.0000 g | INTRAVENOUS | Status: DC
  Administered 2023-12-24: 2 g via INTRAVENOUS
  Filled 2023-12-23: qty 20

## 2023-12-23 MED ORDER — HYDROMORPHONE HCL 1 MG/ML IJ SOLN: 0.5000 mg | INTRAMUSCULAR | Status: DC | PRN

## 2023-12-23 MED ORDER — MIRABEGRON ER 25 MG PO TB24
25.0000 mg | ORAL_TABLET | Freq: Every day | ORAL | Status: DC
  Administered 2023-12-24 – 2023-12-25 (×2): 25 mg via ORAL
  Filled 2023-12-23 (×2): qty 1

## 2023-12-23 MED ORDER — MILK AND MOLASSES ENEMA: 1.0000 | Freq: Once | RECTAL | Status: DC | PRN

## 2023-12-23 MED ORDER — METRONIDAZOLE 500 MG/100ML IV SOLN
500.0000 mg | Freq: Two times a day (BID) | INTRAVENOUS | Status: DC
  Administered 2023-12-23 – 2023-12-25 (×4): 500 mg via INTRAVENOUS
  Filled 2023-12-23 (×4): qty 100

## 2023-12-23 MED ORDER — ONDANSETRON HCL 4 MG/2ML IJ SOLN
4.0000 mg | Freq: Once | INTRAMUSCULAR | Status: AC
  Administered 2023-12-23: 4 mg via INTRAVENOUS
  Filled 2023-12-23: qty 2

## 2023-12-23 MED ORDER — SODIUM CHLORIDE 0.9 % IV SOLN: INTRAVENOUS | Status: AC

## 2023-12-23 MED ORDER — IOHEXOL 350 MG/ML SOLN
75.0000 mL | Freq: Once | INTRAVENOUS | Status: AC | PRN
  Administered 2023-12-23: 75 mL via INTRAVENOUS

## 2023-12-23 MED ORDER — POLYETHYLENE GLYCOL 3350 17 G PO PACK: 17.0000 g | PACK | Freq: Every day | ORAL | Status: DC | PRN

## 2023-12-23 MED ORDER — ACETAMINOPHEN 650 MG RE SUPP: 650.0000 mg | Freq: Four times a day (QID) | RECTAL | Status: DC | PRN

## 2023-12-23 MED ORDER — FLUOROMETHOLONE 0.1 % OP SUSP
1.0000 [drp] | Freq: Two times a day (BID) | OPHTHALMIC | Status: DC
  Administered 2023-12-24 – 2023-12-25 (×4): 1 [drp] via OPHTHALMIC
  Filled 2023-12-23 (×2): qty 5

## 2023-12-23 MED ORDER — METHOCARBAMOL 1000 MG/10ML IJ SOLN: 500.0000 mg | Freq: Four times a day (QID) | INTRAMUSCULAR | Status: DC | PRN

## 2023-12-23 MED ORDER — PANTOPRAZOLE SODIUM 40 MG PO TBEC
40.0000 mg | DELAYED_RELEASE_TABLET | Freq: Every day | ORAL | Status: DC
  Administered 2023-12-24: 40 mg via ORAL
  Filled 2023-12-23 (×2): qty 1

## 2023-12-23 MED ORDER — ACETAMINOPHEN 500 MG PO TABS
1000.0000 mg | ORAL_TABLET | Freq: Once | ORAL | Status: AC
  Administered 2023-12-23: 1000 mg via ORAL
  Filled 2023-12-23: qty 2

## 2023-12-23 MED ORDER — ALBUTEROL SULFATE (2.5 MG/3ML) 0.083% IN NEBU: 2.5000 mg | INHALATION_SOLUTION | RESPIRATORY_TRACT | Status: DC | PRN

## 2023-12-23 MED ORDER — ONDANSETRON HCL 4 MG/2ML IJ SOLN: 4.0000 mg | Freq: Four times a day (QID) | INTRAMUSCULAR | Status: DC | PRN

## 2023-12-23 MED ORDER — METRONIDAZOLE 500 MG/100ML IV SOLN
500.0000 mg | Freq: Once | INTRAVENOUS | Status: AC
  Administered 2023-12-23: 500 mg via INTRAVENOUS
  Filled 2023-12-23: qty 100

## 2023-12-23 MED ORDER — ACETAMINOPHEN 325 MG PO TABS: 650.0000 mg | ORAL_TABLET | Freq: Four times a day (QID) | ORAL | Status: DC | PRN

## 2023-12-23 MED ORDER — LORAZEPAM 0.5 MG PO TABS
0.5000 mg | ORAL_TABLET | Freq: Every day | ORAL | Status: DC
  Administered 2023-12-24 – 2023-12-25 (×2): 0.5 mg via ORAL
  Filled 2023-12-23 (×2): qty 1

## 2023-12-23 MED ORDER — SENNA 8.6 MG PO TABS
1.0000 | ORAL_TABLET | Freq: Two times a day (BID) | ORAL | Status: DC
  Administered 2023-12-24: 8.6 mg via ORAL
  Filled 2023-12-23: qty 1

## 2023-12-23 MED ORDER — AMANTADINE HCL 100 MG PO CAPS
100.0000 mg | ORAL_CAPSULE | Freq: Two times a day (BID) | ORAL | Status: DC
  Administered 2023-12-24 – 2023-12-25 (×3): 100 mg via ORAL
  Filled 2023-12-23 (×5): qty 1

## 2023-12-23 MED ORDER — ONDANSETRON HCL 4 MG PO TABS: 4.0000 mg | ORAL_TABLET | Freq: Four times a day (QID) | ORAL | Status: DC | PRN

## 2023-12-23 NOTE — ED Notes (Addendum)
 Patient resting.

## 2023-12-23 NOTE — Assessment & Plan Note (Signed)
-  will monitor on tele avoid QT prolonging medications, rehydrate correct electrolytes ? ?

## 2023-12-23 NOTE — Progress Notes (Signed)
 ED Pharmacy Antibiotic Sign Off An antibiotic consult was received from an ED provider for Vancomycin  per pharmacy dosing for Sepsis. A chart review was completed to assess appropriateness.   The following one time order(s) were placed:  Vancomycin  1000 mg x1  Further antibiotic and/or antibiotic pharmacy consults should be ordered by the admitting provider if indicated.   Thank you for allowing pharmacy to be a part of this patient's care.   [name], PharmD PGY1 Health-System Pharmacy Administration and Leadership Resident [facility]  12/23/2023 5:17 PM

## 2023-12-23 NOTE — ED Notes (Signed)
 Two attempts made for IV access, unsuccessful, IV team and peer consult requested

## 2023-12-23 NOTE — Assessment & Plan Note (Signed)
 May be contributing to patient abdominal pain at the facility.  Will order bowel regimen

## 2023-12-23 NOTE — ED Provider Notes (Signed)
  EMERGENCY DEPARTMENT AT [facility] Provider Note   CSN: [id number] Arrival date & time: 12/23/23  1310     Patient presents with: No chief complaint on file.   Natasha Frederick is a 68 y.o. female with past medical history significant for arthritis, Huntington disease, previous history of perforated gastric ulcer, recently diagnosed pancreatic mass who presents with concern for generalized weakness for 2 weeks.  Fall 1 week ago with bruise noted on head.  Does not take a blood thinner.  She had 1 episode of vomiting 2 days ago, none since then.  She denies any significant abdominal pain.  She does endorse feeling some shortness of breath.  She denies any chest pain.   HPI     Prior to Admission medications   Medication Sig Start Date End Date Taking? Authorizing Provider  acetaminophen  (TYLENOL ) 500 MG tablet Take 1,000 mg by mouth 2 (two) times daily as needed for mild pain (pain score 1-3) or moderate pain (pain score 4-6).   Yes [provider]  amantadine  (SYMMETREL ) 100 MG capsule Take 100 mg by mouth 2 (two) times daily. 11/02/22  Yes [provider]  fluorometholone  (FML) 0.1 % ophthalmic suspension Place 1 drop into both eyes 2 (two) times daily. 01/09/23  Yes [provider]  LORazepam  (ATIVAN ) 0.5 MG tablet Take 1-2 tablets (0.5-1 mg total) by mouth daily. May take additional dose if needed for anxiety Patient taking differently: Take 0.5 mg by mouth daily. May take additional dose if needed for anxiety 12/20/23  Yes Singh, Prashant K, MD  nystatin  cream (MYCOSTATIN ) apply to affected rash twice daily as needed 05/03/23  Yes Burchette, Wolm ORN, MD  pantoprazole  (PROTONIX ) 40 MG tablet Take 1 tablet (40 mg total) by mouth daily. 12/20/23  Yes Singh, Prashant K, MD  RESTASIS  0.05 % ophthalmic emulsion Place 1 drop into both eyes 2 (two) times daily. 02/12/23  Yes [provider]  risperiDONE  (RISPERDAL ) 1 MG tablet Take 1 mg by mouth  daily. 12/13/23  Yes [provider]  sertraline  (ZOLOFT ) 100 MG tablet TAKE 1 TABLET(100 MG) BY MOUTH DAILY Patient taking differently: Take 100 mg by mouth daily. TAKE 1 TABLET(100 MG) BY MOUTH DAILY 05/08/23  Yes Burchette, Wolm ORN, MD  Vibegron  (GEMTESA ) 75 MG TABS Take 1 tablet by mouth daily Patient taking differently: Take 75 mg by mouth at bedtime. Take 1 tablet by mouth daily 10/24/22  Yes [place], Betty G, MD    Allergies: Bee venom, Nsaids, Adhesive [tape], Codeine, Penicillins, and Sulfa antibiotics    Review of Systems  All other systems reviewed and are negative.   Updated Vital Signs BP 132/72   Pulse 85   Temp (!) 101.1 F (38.4 C) (Oral)   Resp 20   SpO2 98%   Physical Exam Vitals and nursing note reviewed.  Constitutional:      General: She is not in acute distress.    Appearance: Normal appearance.  HENT:     Head: Normocephalic and atraumatic.  Eyes:     General:        Right eye: No discharge.        Left eye: No discharge.  Cardiovascular:     Rate and Rhythm: Normal rate and regular rhythm.     Heart sounds: No murmur heard.    No friction rub. No gallop.  Pulmonary:     Effort: Pulmonary effort is normal.     Breath sounds: Normal breath sounds.  Comments: Some mild wheezing on expiration Abdominal:     General: Bowel sounds are normal.     Palpations: Abdomen is soft.     Comments: Diffuse tenderness palpation throughout the abdomen, no rebound, rigidity, guarding.  Musculoskeletal:     Comments: Right sided BKA.  Moves bilateral lower extremities, upper extremities without difficulty.  Intact strength throughout.  Intact sensation throughout.  Skin:    General: Skin is warm and dry.     Capillary Refill: Capillary refill takes less than 2 seconds.     Comments: Large bruise and advanced stage of healing noted to frontal forehead.  No raccoon eyes.  No base of skull bruising.  Neurological:     Mental Status: She is alert. Mental  status is at baseline.  Psychiatric:        Mood and Affect: Mood normal.        Behavior: Behavior normal.     (all labs ordered are listed, but only abnormal results are displayed) Labs Reviewed  CBC - Abnormal; Notable for the following components:      Result Value   WBC 18.8 (*)    RBC 3.79 (*)    Hemoglobin 11.2 (*)    HCT 34.2 (*)    RDW 17.2 (*)    All other components within normal limits  COMPREHENSIVE METABOLIC PANEL WITH GFR - Abnormal; Notable for the following components:   Potassium 3.3 (*)    CO2 20 (*)    Glucose, Bld 126 (*)    BUN 7 (*)    Total Protein 5.8 (*)    Albumin  2.8 (*)    AST 183 (*)    ALT 193 (*)    Alkaline Phosphatase 910 (*)    Total Bilirubin 5.7 (*)    All other components within normal limits  RESP PANEL BY RT-PCR (RSV, FLU A&B, COVID)  RVPGX2  CULTURE, BLOOD (ROUTINE X 2)  CULTURE, BLOOD (ROUTINE X 2)  LIPASE, BLOOD  URINALYSIS, ROUTINE W REFLEX MICROSCOPIC  I-STAT CG4 LACTIC ACID, ED    EKG: EKG Interpretation Date/Time:  Monday December 23 2023 13:25:17 EDT Ventricular Rate:  83 PR Interval:    QRS Duration:  129 QT Interval:  454 QTC Calculation: 534 R Axis:   53  Text Interpretation: Atrial flutter with predominant 3:1 AV block IVCD, consider atypical RBBB Borderline ST elevation, anterior leads Interpretation limited secondary to artifact Confirmed by Neysa Clap [phone]) on 12/23/2023 1:48:24 PM  Radiology: [facility] Chest Portable 1 View Result Date: 12/23/2023 CLINICAL DATA:  Short of breath EXAM: PORTABLE CHEST 1 VIEW COMPARISON:  None Available. FINDINGS: Normal mediastinum and cardiac silhouette. Normal pulmonary vasculature. No evidence of effusion, infiltrate, or pneumothorax. No acute bony abnormality. Hand obscures a portion of the RIGHT thorax. IMPRESSION: No acute cardiopulmonary process. Electronically Signed   By: Jackquline Boxer M.D.   On: 12/23/2023 14:12     Procedures   Medications Ordered in the ED   cefTRIAXone  (ROCEPHIN ) 1 g in sodium chloride  0.9 % 100 mL IVPB (has no administration in time range)  metroNIDAZOLE  (FLAGYL ) IVPB 500 mg (has no administration in time range)  lactated ringers  bolus 1,000 mL (has no administration in time range)  ondansetron  (ZOFRAN ) injection 4 mg (4 mg Intravenous Given 12/23/23 1508)  acetaminophen  (TYLENOL ) tablet 1,000 mg (1,000 mg Oral Given 12/23/23 1507)  Medical Decision Making  This patient is a 68 y.o. female  who presents to the ED for concern of generalized weakness, nausea, abdominal pain, head injury  Differential diagnoses prior to evaluation: The emergent differential diagnosis includes, but is not limited to,  CVA, spinal cord injury, ACS, arrhythmia, syncope, orthostatic hypotension, sepsis, hypoglycemia, hypoxia, electrolyte disturbance, endocrine disorder, anemia, environmental exposure, polypharmacy, The causes of generalized abdominal pain include but are not limited to AAA, mesenteric ischemia, appendicitis, diverticulitis, DKA, gastritis, gastroenteritis, AMI, nephrolithiasis, pancreatitis, peritonitis, adrenal insufficiency,lead poisoning, iron toxicity, intestinal ischemia, constipation, UTI,SBO/LBO, splenic rupture, biliary disease, IBD, IBS, PUD, or hepatitis, also considered in context of fall, head injuryy: epidural hematoma, subdural hematoma, skull fracture, subarachnoid hemorrhage, unstable cervical spine fracture, concussion vs other MSK injury  . This is not an exhaustive differential.   Past Medical History / Co-morbidities / Social History:  arthritis, Huntington disease, previous history of perforated gastric ulcer  Additional history: Chart reviewed. Pertinent results include: Lab work, imaging from previous emergency department visits, previous hospital admissions.  Physical Exam: Physical exam performed. The pertinent findings include: Febrile on arrival, temp 101.1.  Normal heart  rate and rhythm.  Normotensive.  98% oxygen saturation on room air.  Lab Tests/Imaging studies: I personally interpreted labs/imaging and the pertinent results include: Some labs still pending at time of handoff, plain film chest x-ray with no evidence of intrathoracic process, although her hand does obscure part of her right thorax.  CT head, C-spine, abdomen pelvis with contrast pending at time of handoff. I agree with the radiologist interpretation.  CBC with significant leukocytosis, white blood cells 18.8, mild anemia, hemoglobin 11.2.  CMP with some mild hypokalemia, Tessman 3.3.  She does have elevated AST, ALT, alkaline phosphatase, total bilirubin suggesting obstructive intra-abdominal process.  Do suspect acute infectious etiology given her severe leukocytosis.  Cardiac monitoring: EKG obtained and interpreted by myself and attending physician which shows: Limited with artifact, suspect normal sinus rhythm with artifact, possible flutter rhythm.  Repeat EKG shows normal sinus rhythm, PACs.   Medications: I ordered medication including Tylenol  for fever, Zofran  for nausea.  I have reviewed the patients home medicines and have made adjustments as needed.  Given leukocytosis, concern for biliary obstruction, will start prophylactic antibiotics and fluids.   3:28 PM Care of Jadae Steinke transferred to [facility] and Dr. Patsey at the end of my shift as the patient will require reassessment once labs/imaging have resulted. Patient presentation, ED course, and plan of care discussed with review of all pertinent labs and imaging. Please see his/her note for further details regarding further ED course and disposition. Plan at time of handoff is likely admission for weakness, infection, but will be pending further workup. This may be altered or completely changed at the discretion of the oncoming team pending results of further workup.   Final diagnoses:  None    ED Discharge Orders      None          Rosan Sherlean DEL, PA-C 12/23/23 1528    Neysa Caron PARAS, DO 12/23/23 1535

## 2023-12-23 NOTE — Assessment & Plan Note (Signed)
-  Non-operative

## 2023-12-23 NOTE — ED Triage Notes (Addendum)
 Pt [facility] from [facility] unit for generalized weakness x 2 weeks with episode of vomiting 2 days ago. Pt does not endorse Abd pain.  Fall 1 week ago resulting in bruise on head.  A&O x 3, D/O to time.  Pt receiving some kind of workup on pancreas per staff at facility. Also hx of Huntinton's Disease.   130/68 HR 94, 98% RA, CBG 157, T:97.2

## 2023-12-23 NOTE — Assessment & Plan Note (Signed)
 Chronic and stable.

## 2023-12-23 NOTE — H&P (Addendum)
 Natasha Frederick FMW:[id number] DOB: 1956-02-20 DOA: 12/23/2023     PCP: Micheal Wolm ORN, MD    Patient arrived to ER on 12/23/23 at 1310 Referred by Attending Patsey Lot, MD   Patient coming from:    From facility   [facility]    Chief Complaint: Increased weakness   HPI: Natasha Frederick is a 68 y.o. 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    Presented with  Increased weakness for the past 2 weeks is been nausea and vomiting abdominal pain poorly controlled had a fall 1 week ago with bruise to the head Come in with fever, up to 101.1 Has non resectable mass recently biopsied Started on broad spectrum abx rocephin  vanc and flagyl   Recent admission CT scan abdomen pelvis found severe intrahepatic and extrahepatic biliary dilatation and moderately pancreatic duct dilatation ill-defined low-density mass in pancreatic neck new extrinsic narrowing portal vein SMV confluence without signs of thrombosis, and 5 mm subpleural right lower lobe which could be possible metastases. [facility] was consulted.   ERCP on 12/17/2023 with a malignant appearing stricture found on ERCP in the lower part of CBD, this was stented. Prelim biopsy results from the ERCP suggestive of adenocarcinoma, CA 19.9 extremely elevated ALT suggesting possible underlying cholangiocarcinoma, per husband wants patient to be discharged back to SNF with hospice follow-up comfort care  Patient also have right ischial tuberosity mass 2.9 x 2.5 cm fluid collection concerning for abscess IR was consulted no abscess was found  Patient was discharged to SNF for comfort care with palliative consult Now emergency department she is actually appears to be comfortable She was found to be  febrile in the emergency department Noted to have leukocytosis and was given recent admission was given IV fluids for sepsis broad-spectrum antibiotics Family wanted patient to be admitted Patient confirms she wants to be DO NOT RESUSCITATE DO NOT INTUBATE Wanted us  to still stay focused mainly on comfort  Denies significant ETOH intake   Does not smoke   Regarding pertinent Chronic problems:    malnutrition BMI Readings from Last 1 Encounters:  12/16/23 17.47 kg/m    Chronic anemia - baseline hg Hemoglobin & Hematocrit  Recent Labs    12/19/23 0215 12/20/23 0259 12/23/23 1411  HGB 10.2* 10.0* 11.2*   Cancer: Pancreatic mass   While in ER: Clinical Course as of 12/23/23 2052  Mon Dec 23, 2023  1528 Pancreatic mass, here for weakness. Febrile, leukocytosis-- sepsis. Unclear source. FU on imaging [BH]  2034 I had secretary to page multiple times hospitalist for admission starting at 530 about 3 hours prior to now.  They have repeatedly told me they had paged for admission however now I am being told that the initial admission was never paged to the hospitalist. [BH]    Clinical Course User Index [BH] Henderly, Britni A, PA-C     Lab Orders         Resp panel by RT-PCR (RSV, Flu A&B, Covid) Anterior Nasal Swab         Blood culture (routine x 2)         CBC         Comprehensive metabolic panel         Urinalysis, Routine w reflex microscopic -Urine, Clean Catch  Lipase, blood         I-Stat CG4 Lactic Acid      CT HEAD No acute intracranial abnormality. 2. Moderate parenchymal volume loss. 3. Encephalomalacia in the anterior inferior frontal lobes, likely in the setting of prior trauma, and in the left cerebellum.   1. No acute abnormality of the cervical spine related to the reported neck trauma. 2. 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  CXR - No acute  cardiopulmonary process.   CTabd/pelvis -  Interval placement of a plastic biliary stent. Severe intrahepatic and extrahepatic biliary dilatation has not significantly changed. 2. Severe pancreatic duct dilatation with transition point near the pancreatic head and neck. There is a poorly defined hypodense lesion at the pancreatic head-neck region measuring up to 1.4 cm. Findings are suggestive for a pancreatic neoplasm.   Marked narrowing of the proximal splenic vein and portal venous Confluence  . Severe distension of the rectum with a large amount of stool.  rectal stool impaction.    Indeterminate low-density in the soft tissue around the right ischial tuberosity. This area has been previously biopsied. No significant bone destruction in the adjacent right ischial tuberosity. Small nodules in the right middle lobe and right lower lobe. These nodules are indeterminate but metastatic disease cannot be excluded.  Following Medications were ordered in ER: Medications  ondansetron  (ZOFRAN ) injection 4 mg (4 mg Intravenous Given 12/23/23 1508)  acetaminophen  (TYLENOL ) tablet 1,000 mg (1,000 mg Oral Given 12/23/23 1507)  cefTRIAXone  (ROCEPHIN ) 1 g in sodium chloride  0.9 % 100 mL IVPB (0 g Intravenous Stopped 12/23/23 1647)  metroNIDAZOLE  (FLAGYL ) IVPB 500 mg (0 mg Intravenous Stopped 12/23/23 1830)  lactated ringers  bolus 1,000 mL (0 mLs Intravenous Stopped 12/23/23 2026)  iohexol  (OMNIPAQUE ) 350 MG/ML injection 75 mL (75 mLs Intravenous Contrast Given 12/23/23 1641)  vancomycin  (VANCOCIN ) IVPB 1000 mg/200 mL premix (0 mg Intravenous Stopped 12/23/23 2027)  lactated ringers  bolus 1,000 mL (0 mLs Intravenous Stopped 12/23/23 2027)      ED Triage Vitals [12/23/23 1324]  Encounter Vitals Group     BP 132/72     Girls Systolic BP Percentile      Girls Diastolic BP Percentile      Boys Systolic BP Percentile      Boys Diastolic BP Percentile      Pulse Rate 85     Resp 20     Temp (!)  101.1 F (38.4 C)     Temp Source Oral     SpO2 98 %     Weight      Height      Head Circumference      Peak Flow      Pain Score 0     Pain Loc      Pain Education      Exclude from Growth Chart   TMAX(24)@     _________________________________________ Significant initial  Findings: Abnormal Labs Reviewed  CBC - Abnormal; Notable for the following components:      Result Value   WBC 18.8 (*)    RBC 3.79 (*)    Hemoglobin 11.2 (*)    HCT 34.2 (*)    RDW 17.2 (*)    All other components within normal limits  COMPREHENSIVE METABOLIC PANEL WITH GFR - Abnormal; Notable for the following components:   Potassium 3.3 (*)    CO2 20 (*)    Glucose,  Bld 126 (*)    BUN 7 (*)    Total Protein 5.8 (*)    Albumin  2.8 (*)    AST 183 (*)    ALT 193 (*)    Alkaline Phosphatase 910 (*)    Total Bilirubin 5.7 (*)    All other components within normal limits  I-STAT CG4 LACTIC ACID, ED - Abnormal; Notable for the following components:   Lactic Acid, Venous 2.2 (*)    All other components within normal limits    ECG: Ordered Personally reviewed and interpreted by me showing: HR : 83 Rhythm: Atrial flutter with predominant 3:1 AV block IVCD, consider atypical RBBB Borderline ST elevation, anterior leads Interpretation limited secondary to artifact QTC 5534   This patient meets SIRS Criteria and may be septic.   The recent clinical data is shown below. Vitals:   12/23/23 1900 12/23/23 1930 12/23/23 2000 12/23/23 2027  BP: 120/79 115/67 116/77 127/74  Pulse: 87 82 80 80  Resp: (!) 27 (!) 21 15 19   Temp:    97.8 F (36.6 C)  TempSrc:    Axillary  SpO2: 100% 97% 100% 99%    WBC     Component Value Date/Time   WBC 18.8 (H) 12/23/2023 1411   LYMPHSABS 1.4 12/20/2023 0259   MONOABS 0.8 12/20/2023 0259   EOSABS 0.5 12/20/2023 0259   BASOSABS 0.1 12/20/2023 0259    Lactic Acid, Venous    Component Value Date/Time   LATICACIDVEN 1.1 12/23/2023 1725     Procalcitonin    Ordered      UA   ordered     Results for orders placed or performed during the hospital encounter of 12/23/23  Resp panel by RT-PCR (RSV, Flu A&B, Covid) Anterior Nasal Swab     Status: None   Collection Time: 12/23/23  2:11 PM   Specimen: Anterior Nasal Swab  Result Value Ref Range Status   SARS Coronavirus 2 by RT PCR NEGATIVE NEGATIVE Final   Influenza A by PCR NEGATIVE NEGATIVE Final   Influenza B by PCR NEGATIVE NEGATIVE Final         Resp Syncytial Virus by PCR NEGATIVE NEGATIVE Final          ABX started Antibiotics Given (last 72 hours)     Date/Time Action Medication Dose Rate   12/23/23 1536 New Bag/Given   cefTRIAXone  (ROCEPHIN ) 1 g in sodium chloride  0.9 % 100 mL IVPB 1 g 200 mL/hr   12/23/23 1656 New Bag/Given   metroNIDAZOLE  (FLAGYL ) IVPB 500 mg 500 mg 100 mL/hr   12/23/23 1830 New Bag/Given   vancomycin  (VANCOCIN ) IVPB 1000 mg/200 mL premix 1,000 mg 200 mL/hr        __________________________________________________________ Recent Labs  Lab 12/17/23 0533 12/18/23 0346 12/19/23 0215 12/20/23 0259 12/23/23 1411  NA 139 142 140 142 138  K 3.5 3.6 2.9* 4.0 3.3*  CO2 23 18* 19* 16* 20*  GLUCOSE 106* 123* 90 96 126*  BUN 7* 10 6* 6* 7*  CREATININE 0.44 0.42* 0.41* 0.36* 0.45  CALCIUM  9.5 8.9 8.4* 8.1* 8.9  MG  --  1.6* 1.5* 2.1  --    Cr  stable,  Up from baseline see below Lab Results  Component Value Date   CREATININE 0.45 12/23/2023   CREATININE 0.36 (L) 12/20/2023   CREATININE 0.41 (L) 12/19/2023    Recent Labs  Lab 12/17/23 0533 12/18/23 0346 12/19/23 0215 12/20/23 0259 12/23/23 1411  AST 150* 72* 39 61* 183*  ALT 239* 172* 120* 113* 193*  ALKPHOS 1,155* 852* 675* 596* 910*  BILITOT 10.8* 3.6* 3.0* 2.6* 5.7*  PROT 6.4* 5.3* 4.8* 4.5* 5.8*  ALBUMIN  3.0* 2.6* 2.4* 2.3* 2.8*   Lab Results  Component Value Date   CALCIUM  8.9 12/23/2023    Plt: Lab Results  Component Value Date   PLT 360 12/23/2023       Recent Labs  Lab  12/17/23 1607 12/18/23 0346 12/19/23 0215 12/20/23 0259 12/23/23 1411  WBC 12.5* 9.5 9.4 7.6 18.8*  NEUTROABS  --  7.2 7.1 4.8  --   HGB 11.1* 10.9* 10.2* 10.0* 11.2*  HCT 33.7* 33.5* 31.0* 30.8* 34.2*  MCV 88.7 88.6 86.6 88.8 90.2  PLT 430* 362 356 159 360    HG/HCT  stable,      Component Value Date/Time   HGB 11.2 (L) 12/23/2023 1411   HCT 34.2 (L) 12/23/2023 1411   MCV 90.2 12/23/2023 1411     Recent Labs  Lab 12/23/23 1411  LIPASE 43   No results for input(s): AMMONIA in the last 168 hours.    _______________________________________________ Hospitalist was called for admission for   Sepsis   Adenocarcinoma  History of biliary stent insertion      The following Work up has been ordered so far:  Orders Placed This Encounter  Procedures   Critical Care   Resp panel by RT-PCR (RSV, Flu A&B, Covid) Anterior Nasal Swab   Blood culture (routine x 2)   DG Chest Portable 1 View   CT Head Wo Contrast   CT Cervical Spine Wo Contrast   CT ABDOMEN PELVIS W CONTRAST   CBC   Comprehensive metabolic panel   Urinalysis, Routine w reflex microscopic -Urine, Clean Catch   Lipase, blood   Bladder scan   Consult to hospitalist   I-Stat CG4 Lactic Acid   ED EKG   EKG 12-Lead   EKG 12-Lead   EKG 12-Lead     OTHER Significant initial  Findings:  labs showing:     DM  labs:  HbA1C: No results for input(s): HGBA1C in the last 8760 hours.     CBG (last 3)  No results for input(s): GLUCAP in the last 72 hours.        Cultures:    Component Value Date/Time   SDES SKIN 12/18/2023 1508   SPECREQUEST CORE RIGHT 12/18/2023 1508   CULT  12/18/2023 1508    NO GROWTH 4 DAYS NO ANAEROBES ISOLATED; CULTURE IN PROGRESS FOR 5 DAYS Performed at [facility], [address] [place], [place] [address]    REPTSTATUS PENDING 12/18/2023 1508     Radiological Exams on Admission: CT ABDOMEN PELVIS W CONTRAST Result Date: 12/23/2023 CLINICAL DATA:  Abdominal  pain, acute, nonlocalized. EXAM: CT ABDOMEN AND PELVIS WITH CONTRAST TECHNIQUE: Multidetector CT imaging of the abdomen and pelvis was performed using the standard protocol following bolus administration of intravenous contrast. RADIATION DOSE REDUCTION: This exam was performed according to the departmental dose-optimization program which includes automated exposure control, adjustment of the mA and/or kV according to patient size and/or use of iterative reconstruction technique. CONTRAST:  75mL OMNIPAQUE  IOHEXOL  350 MG/ML SOLN COMPARISON:  12/16/2023 FINDINGS: Lower chest: Again noted is a 4 mm nodule in the right lower lobe on image 2/5. At least 2 additional small nodules in the right middle lobe on image 4/5. No pleural effusions. Hepatobiliary: Severe intrahepatic and extrahepatic biliary dilatation has not changed. Interval placement of a plastic  biliary stent that extends into the right intrahepatic bile ducts. Distal aspect of the stent is near the ampulla. Gallbladder remains moderately distended. Limited evaluation for intrahepatic lesions due to the severe biliary distension. Mild stranding around the gallbladder is similar to the previous examination. Pancreas: Again noted is severe pancreatic duct dilatation with the transition point near the pancreatic neck and head. Poorly defined hypodense lesion at the pancreatic head-neck region is again noted measuring 1.4 x 1.2 cm on image 31/3. Limited evaluation of the pancreatic head due to artifact from the spinal hardware. Stranding and inflammatory changes around the pancreatic head. Spleen: Spleen is small with a nodular configuration. Some irregular hypodensities along the left superior aspect of the spleen are similar to a chest CT from 07/14/2021. Adrenals/Urinary Tract: Normal appearance of the adrenal glands. Normal appearance of both kidneys without hydronephrosis. Normal appearance of the urinary bladder but limited evaluation due to artifact from  the left hip arthroplasty. Stomach/Bowel: Again noted is severe distension of the rectum with a large amount of stool. Findings are suggestive for rectal impaction. Diverticula involving the sigmoid colon. Mild dilatation of the stomach. Vascular/Lymphatic: Atherosclerotic disease in the abdominal aorta without aneurysm. Indeterminate densities / soft tissue around the SMA on image 29/3. Mildly prominent lymph nodes in the porta hepatis and precaval region. Main portal vein is patent but there is marked narrowing and attenuation of the proximal splenic vein and the portal venous confluence on image 29/3. Findings are similar to the previous examination and concerning for tumor involvement or extrinsic compression. Reproductive: Limited evaluation due to the rectal distension and left hip arthroplasty. No evidence for an adnexal mass. Other: Again noted is poorly defined low-density in the soft tissue around the right ischial tuberosity on image 82/3. This area has been previously biopsied. No significant bone destruction in the adjacent right ischial tuberosity. This low-density area measures 2.3 x 2.9 cm. Negative for ascites. Negative for free air. Musculoskeletal: Left hip arthroplasty is located. Levoscoliosis in lumbar spine. Posterior lumbar interbody fusion with bilateral pedicle screws and rods at L2, L3 and L4. IMPRESSION: 1. Interval placement of a plastic biliary stent. Severe intrahepatic and extrahepatic biliary dilatation has not significantly changed. 2. Severe pancreatic duct dilatation with transition point near the pancreatic head and neck. There is a poorly defined hypodense lesion at the pancreatic head-neck region measuring up to 1.4 cm. Findings are suggestive for a pancreatic neoplasm. 3. Marked narrowing of the proximal splenic vein and portal venous confluence. Findings are similar to the previous examination and concerning for tumor involvement or extrinsic compression. 4. Severe distension  of the rectum with a large amount of stool. Findings are suggestive for rectal stool impaction. 5. Indeterminate soft tissue around the SMA. Mildly prominent lymph nodes in the porta hepatis and precaval region. Findings are concerning for neoplastic disease. 6. Indeterminate low-density in the soft tissue around the right ischial tuberosity. This area has been previously biopsied. No significant bone destruction in the adjacent right ischial tuberosity. 7. Small nodules in the right middle lobe and right lower lobe. These nodules are indeterminate but metastatic disease cannot be excluded. 8. Aortic Atherosclerosis (ICD10-I70.0). Electronically Signed   By: Juliene Balder M.D.   On: 12/23/2023 17:20   CT Cervical Spine Wo Contrast Result Date: 12/23/2023 EXAM: CT CERVICAL SPINE WITHOUT CONTRAST 12/23/2023 04:42:18 PM TECHNIQUE: CT of the cervical spine was performed without the administration of intravenous contrast. Multiplanar reformatted images are provided for review. Automated exposure control, iterative reconstruction,  and/or weight based adjustment of the mA/kV was utilized to reduce the radiation dose to as low as reasonably achievable. COMPARISON: 04/01/2023 CLINICAL HISTORY: Neck trauma (Age >= 65y). FINDINGS: CERVICAL SPINE: BONES AND ALIGNMENT: Similar alignment with reversal of the normal cervical lordosis. No facet dislocation. Dextrocurvature of the cervical spine. Similar clipal file type deformity of the cervical spine with fusion of the C3 through C6 vertebral bodies. There is also likely partial fusion of C6 and C7 vertebral bodies. Diffuse osteopenia. DEGENERATIVE CHANGES: Degenerative endplate changes greatest at C5-6. SOFT TISSUES: No prevertebral soft tissue swelling. IMPRESSION: 1. No acute abnormality of the cervical spine related to the reported neck trauma. 2. Klippel-Feil type deformity with fusion of the C3 through C6 vertebral bodies and likely partial fusion of C6 and C7 vertebral  bodies. Electronically signed by: Donnice Mania MD 12/23/2023 05:08 PM EDT RP Workstation: [id number]   CT Head Wo Contrast Result Date: 12/23/2023 EXAM: CT HEAD WITHOUT CONTRAST 12/23/2023 04:42:18 PM TECHNIQUE: CT of the head was performed without the administration of intravenous contrast. Automated exposure control, iterative reconstruction, and/or weight based adjustment of the mA/kV was utilized to reduce the radiation dose to as low as reasonably achievable. COMPARISON: CT head 12/16/2023 and 04/01/2023. CLINICAL HISTORY: Head trauma, minor (Age >= 65y). FINDINGS: BRAIN AND VENTRICLES: No acute hemorrhage. No evidence of acute infarct. Ventriculomegaly is similar to prior, likely related to volume loss. Similar encephalomalacia in the anterior inferior frontal lobes, likely in the setting of prior trauma. Additional encephalomalacia in the left cerebellum. Nonspecific hypoattenuation in the periventricular and subcortical white matter, most likely representing chronic microvascular ischemic changes. ORBITS: No acute abnormality. SINUSES: No acute abnormality. SOFT TISSUES AND SKULL: No acute soft tissue abnormality. No skull fracture. Mild left mastoid effusion. IMPRESSION: 1. No acute intracranial abnormality. 2. Moderate parenchymal volume loss. 3. Encephalomalacia in the anterior inferior frontal lobes, likely in the setting of prior trauma, and in the left cerebellum. Electronically signed by: Donnice Mania MD 12/23/2023 05:00 PM EDT RP Workstation: [id number]   DG Chest Portable 1 View Result Date: 12/23/2023 CLINICAL DATA:  Short of breath EXAM: PORTABLE CHEST 1 VIEW COMPARISON:  None Available. FINDINGS: Normal mediastinum and cardiac silhouette. Normal pulmonary vasculature. No evidence of effusion, infiltrate, or pneumothorax. No acute bony abnormality. Hand obscures a portion of the RIGHT thorax. IMPRESSION: No acute cardiopulmonary process. Electronically Signed   By: Jackquline Boxer M.D.    On: 12/23/2023 14:12   _______________________________________________________________________________________________________ Latest  Blood pressure 127/74, pulse 80, temperature 97.8 F (36.6 C), temperature source Axillary, resp. rate 19, SpO2 99%.   Vitals  labs and radiology finding personally reviewed  Review of Systems:    Pertinent positives include:  , Fevers, chills, fatigue,abdominal pain,  Constitutional:  No weight loss, night sweats weight loss  HEENT:  No headaches, Difficulty swallowing,Tooth/dental problems,Sore throat,  No sneezing, itching, ear ache, nasal congestion, post nasal drip,  Cardio-vascular:  No chest pain, Orthopnea, PND, anasarca, dizziness, palpitations.no Bilateral lower extremity swelling  GI:  No heartburn, indigestion,  nausea, vomiting, diarrhea, change in bowel habits, loss of appetite, melena, blood in stool, hematemesis Resp:  no shortness of breath at rest. No dyspnea on exertion, No excess mucus, no productive cough, No non-productive cough, No coughing up of blood.No change in color of mucus.No wheezing. Skin:  no rash or lesions. No jaundice GU:  no dysuria, change in color of urine, no urgency or frequency. No straining to urinate.  No flank pain.  Musculoskeletal:  No joint pain or no joint swelling. No decreased range of motion. No back pain.  Psych:  No change in mood or affect. No depression or anxiety. No memory loss.  Neuro: no localizing neurological complaints, no tingling, no weakness, no double vision, no gait abnormality, no slurred speech, no confusion  All systems reviewed and apart from HOPI all are negative _______________________________________________________________________________________________ Past Medical History:   Past Medical History:  Diagnosis Date   Acute embolism and thrombosis of deep vein of both distal lower extremities (HCC)    Arthritis    hip   C. difficile diarrhea    Endometriosis     Huntington disease (HCC)    Peptic ulcer    Perforated gastric ulcer (HCC)    Scoliosis       Past Surgical History:  Procedure Laterality Date   AMPUTATION Right 08/08/2018   Procedure: AMPUTATION BELOW KNEE RIGHT LOWER EXTREMITY;  Surgeon: Oris Krystal FALCON, MD;  Location: [facility] OR;  Service: Vascular;  Laterality: Right;   ANTERIOR LAT LUMBAR FUSION Left 01/05/2014   Procedure: LUMBAR TWO TO THREE, LUMBAR LUMBAR THREE TO FOUR, ANTERIOR LATERAL LUMBAR FUSION 2 LEVELS;  Surgeon: Darina MALVA Boehringer, MD;  Location: [facility] NEURO ORS;  Service: Neurosurgery;  Laterality: Left;   APPENDECTOMY  1963   BILIARY BRUSHING  12/17/2023   Procedure: BRUSH BIOPSY, BILE DUCT;  Surgeon: Avram Lupita BRAVO, MD;  Location: [facility] ENDOSCOPY;  Service: Gastroenterology;;   BILIARY STENT PLACEMENT  12/17/2023   Procedure: INSERTION, STENT, BILE DUCT;  Surgeon: Avram Lupita BRAVO, MD;  Location: [facility] END[facility]COPY;  Service: Gastroenterology;;   BIOPSY  11/25/2018   Procedure: BIOPSY;  Surgeon: Legrand Victory LITTIE DOUGLAS, MD;  Location: [facility] ENDOSCOPY;  Service: Gastroenterology;;   BREAST SURGERY Left 1987   bx   COLONOSCOPY W/ POLYPECTOMY     ERCP N/A 12/17/2023   Procedure: ERCP, WITH INTERVENTION IF INDICATED;  Surgeon: Avram Lupita BRAVO, MD;  Location: [facility] ENDOSCOPY;  Service: Gastroenterology;  Laterality: N/A;   ESOPHAGOGASTRODUODENOSCOPY N/A 12/17/2023   Procedure: EGD (ESOPHAGOGASTRODUODENOSCOPY);  Surgeon: Avram Lupita BRAVO, MD;  Location: [facility] ENDOSCOPY;  Service: Gastroenterology;  Laterality: N/A;   ESOPHAGOGASTRODUODENOSCOPY (EGD) WITH PROPOFOL  N/A 11/25/2018   Procedure: ESOPHAGOGASTRODUODENOSCOPY (EGD) WITH PROPOFOL ;  Surgeon: Legrand Victory LITTIE DOUGLAS, MD;  Location: [facility] ENDOSCOPY;  Service: Gastroenterology;  Laterality: N/A;   EYE SURGERY Bilateral    FEMORAL-POPLITEAL BYPASS GRAFT Bilateral 08/06/2018   Procedure: BILATERAL POPLITEAL AND TIBIAL EMBOLECTOMIES, LEFT LEG FASCIOTOMY;  Surgeon: Oris Krystal FALCON, MD;  Location: [facility] OR;  Service: Vascular;   Laterality: Bilateral;   LAPAROSCOPY N/A 07/30/2018   Procedure: DIAGNOSTIC LAPAROSCOPY, Omentopexy gram patch, Washout of intra-abdominal abcesses x 3;  Surgeon: Sheldon Standing, MD;  Location: [facility] ORS;  Service: General;  Laterality: N/A;   OOPHORECTOMY Left 1977   Planters wart Left 2005   radial keratoto Bilateral    TONSILLECTOMY  1958   TOTAL HIP ARTHROPLASTY Left 07/19/2014   Procedure: TOTAL HIP ARTHROPLASTY;  Surgeon: Dempsey Sensor, MD;  Location: [facility] OR;  Service: Orthopedics;  Laterality: Left;   TUBAL LIGATION  1986    Social History:  Ambulatory  bed bound     reports that she quit smoking about 29 years ago. Her smoking use included cigarettes. She started smoking about 37 years ago. She has a 8 pack-year smoking history. She has never used smokeless tobacco. She reports current alcohol use of about 3.0 standard drinks of alcohol per week. She reports that she does not use  drugs.     Family History:   Family History  Problem Relation Age of Onset   Lung cancer Mother    Stroke Father    Huntington's disease Paternal Grandmother    Huntington's disease Paternal Aunt    Huntington's disease Paternal Uncle    Arthritis Neg Hx        family hx   Cancer Neg Hx        prostate ca -family hx   Heart disease Neg Hx        family hx   Colon cancer Neg Hx    Esophageal cancer Neg Hx    Pancreatic cancer Neg Hx    Liver disease Neg Hx    ______________________________________________________________________________________________ Allergies: Allergies  Allergen Reactions   Bee Venom Anaphylaxis and Swelling   Nsaids Other (See Comments)    PERFORATED ULCER = NO MORE NSAIDs   Adhesive [Tape] Other (See Comments)    redness   Codeine Nausea And Vomiting   Penicillins     Did it involve swelling of the face/tongue/throat, SOB, or low BP? Yes Did it involve sudden or severe rash/hives, skin peeling, or any reaction on the inside of your mouth or nose? Unknown Did you need  to seek medical attention at a hospital or doctor's office? Yes When did it last happen?   age 47-40    If all above answers are "NO", may proceed with cephalosporin use.    Sulfa Antibiotics Itching     Prior to Admission medications   Medication Sig Start Date End Date Taking? Authorizing Provider  acetaminophen  (TYLENOL ) 500 MG tablet Take 1,000 mg by mouth 2 (two) times daily as needed for mild pain (pain score 1-3) or moderate pain (pain score 4-6).   Yes [provider]  amantadine  (SYMMETREL ) 100 MG capsule Take 100 mg by mouth 2 (two) times daily. 11/02/22  Yes [provider]  fluorometholone  (FML) 0.1 % ophthalmic suspension Place 1 drop into both eyes 2 (two) times daily. 01/09/23  Yes [provider]  LORazepam  (ATIVAN ) 0.5 MG tablet Take 1-2 tablets (0.5-1 mg total) by mouth daily. May take additional dose if needed for anxiety Patient taking differently: Take 0.5 mg by mouth daily. May take additional dose if needed for anxiety 12/20/23  Yes Singh, Prashant K, MD  nystatin  cream (MYCOSTATIN ) apply to affected rash twice daily as needed 05/03/23  Yes Burchette, Wolm ORN, MD  pantoprazole  (PROTONIX ) 40 MG tablet Take 1 tablet (40 mg total) by mouth daily. 12/20/23  Yes Singh, Prashant K, MD  RESTASIS  0.05 % ophthalmic emulsion Place 1 drop into both eyes 2 (two) times daily. 02/12/23  Yes [provider]  risperiDONE  (RISPERDAL ) 1 MG tablet Take 1 mg by mouth daily. 12/13/23  Yes [provider]  sertraline  (ZOLOFT ) 100 MG tablet TAKE 1 TABLET(100 MG) BY MOUTH DAILY Patient taking differently: Take 100 mg by mouth daily. TAKE 1 TABLET(100 MG) BY MOUTH DAILY 05/08/23  Yes Burchette, Wolm ORN, MD  Vibegron  (GEMTESA ) 75 MG TABS Take 1 tablet by mouth daily Patient taking differently: Take 75 mg by mouth at bedtime. Take 1 tablet by mouth daily 10/24/22  Yes [place], Betty G, MD     ___________________________________________________________________________________________________ Physical Exam:    12/23/2023    8:27 PM 12/23/2023    8:00 PM 12/23/2023    7:30 PM  Vitals with BMI  Systolic 127 116 884  Diastolic 74 77 67  Pulse 80 80 82  1. General:  in No  Acute distress    Chronically ill  -appearing 2. Psychological: Alert and   Oriented to self and situation 3. Head/ENT:    Dry Mucous Membranes                          Head with bruising neck supple                            Poor Dentition 4. SKIN:  decreased Skin turgor,  Skin clean Dry and intact no rash    5. Heart: Regular rate and rhythm no  Murmur, no Rub or gallop 6. Lungs:   no wheezes or crackles   7. Abdomen: Soft,  non-tender, Non distended thin bowel sounds present 8. Lower extremities: no clubbing, cyanosis, no  edema right BKA 9. Neurologically limited exam as patient appears to be somewhat contracted and not moving spontaneously 10. MSK: Normal range of motion    Chart has been reviewed  ______________________________________________________________________________________________  Assessment/Plan  68 y.o. 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  Will order bowel regimen  Below-knee amputation of right lower  extremity (HCC) Chronic and stable  Sepsis (HCC)  -SIRS criteria met with  elevated white blood cell count,       Component Value Date/Time   WBC 18.8 (H) 12/23/2023 1411   LYMPHSABS 1.4 12/20/2023 0259     tachycardia   ,   fever   RR >20 Today's Vitals   12/23/23 2115 12/23/23 2130 12/23/23 2145 12/23/23 2215  BP: 112/60 131/64 120/61 122/65  Pulse: (!) 29 87 88 91  Resp: 18 18 (!) 21 (!) 32  Temp:      TempSrc:      SpO2: 97% 99% 95% 96%  PainSc:        -Most likely source being: intra-abdominal,      Patient meeting criteria for Severe sepsis with    evidence of end organ damage/organ dysfunction such as  acute metabolic encephalopathy     - Obtain serial lactic acid and procalcitonin level.  - Initiated IV antibiotics in ER: Antibiotics Given (last 72 hours)     Date/Time Action Medication Dose Rate   12/23/23 1536 New Bag/Given   cefTRIAXone  (ROCEPHIN ) 1 g in sodium chloride  0.9 % 100 mL IVPB 1 g 200 mL/hr   12/23/23 1656 New Bag/Given   metroNIDAZOLE  (FLAGYL ) IVPB 500 mg 500 mg 100 mL/hr   12/23/23 1830 New Bag/Given   vancomycin  (VANCOCIN ) IVPB 1000 mg/200 mL premix 1,000 mg 200 mL/hr       Will continue      - await results of blood and urine culture  - Rehydrate   Fluid resuscitation however was limited because  (patient is enrolled in Hospice with comfort care status OR of  shared decision making with patient/ guardian, who prefer limited resuscitation with fluid  10:54 PM   Prolonged QT interval - will monitor on tele avoid QT prolonging medications, rehydrate correct electrolytes    Other plan as per orders.  DVT prophylaxis:  SCD     Code Status:   DNR/DNI  comfort care as per patient  family  I had personally discussed CODE STATUS with patient and family  ACP  has been reviewed     Family Communication:   Family  at  Bedside  plan of care was discussed on the phone with Husband,    Diet heart healthy   Disposition Plan:                               Back to current facility when stable                         Following barriers for discharge:                                                         Pain controlled with PO medications                               Patient and family considering transitioning fully to comfort care at this point they are wishing for continuing IV antibiotics for tonight very gentle rehydration avoid overaggressive intervention And repeat palliative care consult to make sure patient does not get readmitted from a nursing home facility       Consult Orders  (From admission, onward)           Start     Ordered   12/23/23 1750  Consult to hospitalist  Pg by deloris 17:49  Once       Provider:  (Not yet assigned)  Question Answer Comment  Place call to: [facility]   Reason for Consult Admit      12/23/23 1749                              Palliative care    consulted                   Consults called:    NONE   Admission status:  ED Disposition     ED Disposition  Admit   Condition  --   Comment  Hospital Area: [facility] [[id number]]  Level of Care: Telemetry Medical [104]  Covid Evaluation: Asymptomatic - no recent exposure (last 10 days) testing not required  Diagnosis: Sepsis [facility]) [[id number]]  Admitting Physician: Cesilia Shinn [[id number]]  Attending Physician: Tamaira Ciriello [[id number]]  For patients discharging to extended facilities (i.e. SNF, AL, group homes or LTAC) initiate:: Discharge to SNF/Facility Placement COVID-19 Lab Testing Protocol           Obs    Need for IV antibiotics, IV fluids,, IV pain medications,    Level of care     tele  For 12H     Gerilynn Mccullars 12/23/2023, 10:49 PM    [facility]     after 2 AM please  page floor coverage   If 7AM-7PM, please contact the day team taking care of the patient using [email or web address]

## 2023-12-23 NOTE — Subjective & Objective (Signed)
 Come in with fever, up to 101.1 Has non resectable mass recently biopsied Started on broad spectrum abx rocephin  vanc and flagyl 

## 2023-12-23 NOTE — ED Provider Notes (Signed)
 Seen from previous provider.  See note for full HPI.  In summation 68 year old history of Huntington's, recent diagnosis of adenocarcinoma here for evaluation of weakness.  Sounds like cancer recently found after biopsy 5 days ago while admitted for obstructive jaundice.  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  Looking at her prior admission it looks like they had discussed possible hospice?  And did not want treatment of any cancer.  Here she is febrile.  She has no pain, tachycardia, tachypnea, hypoxia.  Plan on follow-up on CT imaging, remaining labs, likely admission Physical Exam  BP 120/61   Pulse 88   Temp 97.8 F (36.6 C) (Axillary)   Resp (!) 21   SpO2 95%   Physical Exam Vitals and nursing note reviewed.  Constitutional:      General: She is not in acute distress.    Appearance: She is well-developed. She is ill-appearing. She is not toxic-appearing or diaphoretic.  HENT:     Head: Atraumatic.     Comments: Large bruise forehead     Nose: Nose normal.     Mouth/Throat:     Mouth: Mucous membranes are dry.  Eyes:     Pupils: Pupils are equal, round, and reactive to light.  Cardiovascular:     Rate and Rhythm: Normal rate.     Pulses: Normal pulses.     Heart sounds: Normal heart sounds.  Pulmonary:     Effort: No respiratory distress.  Abdominal:     General: There is no distension.     Palpations: Abdomen is soft.     Tenderness: There is abdominal tenderness. There is no right CVA tenderness, left CVA tenderness, guarding or rebound.     Comments: Diffusely tender  Musculoskeletal:        General: Normal range of motion.     Cervical back: Normal range of motion.     Comments: RLE amputation  Skin:    General: Skin is warm and dry.     Coloration: Skin is jaundiced.  Neurological:     General: No focal deficit present.     Mental Status: She is alert.  Psychiatric:        Mood and Affect: Mood normal.     Procedures  .Critical Care  Performed by: Edie Rosebud LABOR, PA-C Authorized by: Edie Rosebud LABOR, PA-C   Critical care provider statement:    Critical care time (minutes):  35   Critical care was necessary to treat or prevent imminent or life-threatening deterioration of the following conditions:  Sepsis   Critical care was time spent personally by me on the following activities:  Development of treatment plan with patient or surrogate, discussions with consultants, evaluation of patient's response to treatment, examination of patient, ordering and review of laboratory studies, ordering and review of radiographic studies, ordering and performing treatments and interventions, pulse oximetry, re-evaluation of patient's condition and review of old charts  ED Course / MDM   Care assumed from previous Clinical research associate.  Plan to follow-up on CT abdomen pelvis, CT head as well as remaining labs.  Here she is tachycardic, has leukocytosis, getting IV antibiotics for sepsis.  Viral panel negative.  Has increasing LFTs from prior admission.   Labs and imaging personally viewed and interpreted:  CBC leukocytosis 18.9 Metabolic panel increasing LFT, hypokalemia CT head, CT cervical without significant abnormality CT abdomen pelvis with biliary stent, pancreatic mass, lymphadenopathy Chest x-ray without significant abnormality EKG without ischemic changes occasional bigeminy  Patient assessed at  bedside.  She only complains of generalized weakness and fatigue.  No chest pain, shortness of breath, abd  pain, nausea, vomiting, pain or swelling in her lower legs.  No history of PE or DVT.  Discussed no clear source of affection however given fever, leukocytosis currently at high risk for sepsis.  She will be admitted for broad-spectrum antibiotics for sepsis.  30/cc/kg bolus IV fluids.  Have not been able to obtain a urine sample as of yet.  Patient does not want In-N-Out catheter at this time.  Patient and family at bedside agreeable for admission for further  workup.  The patient appears reasonably stabilized for admission considering the current resources, flow, and capabilities available in the ED at this time, and I doubt any other [facility] requiring further screening and/or treatment in the ED prior to admission.    Clinical Course as of 12/23/23 2210  Mon Dec 23, 2023  1528 Pancreatic mass, here for weakness. Febrile, leukocytosis-- sepsis. Unclear source. FU on imaging [BH]  2034 I had secretary to page multiple times hospitalist for admission starting at 530 about 3 hours prior to now.  They have repeatedly told me they had paged for admission however now I am being told that the initial admission was never paged to the hospitalist. [BH]    Clinical Course User Index [BH] Nakoa Ganus A, PA-C   Medical Decision Making Amount and/or Complexity of Data Reviewed Independent Historian: spouse External Data Reviewed: labs, radiology, ECG and notes. Labs: ordered. Decision-making details documented in ED Course. Radiology: ordered and independent interpretation performed. Decision-making details documented in ED Course. ECG/medicine tests: ordered and independent interpretation performed. Decision-making details documented in ED Course.  Risk OTC drugs. Prescription drug management. Parenteral controlled substances. Decision regarding hospitalization. Diagnosis or treatment significantly limited by social determinants of health.         Nox Talent A, PA-C 12/23/23 2210    Patsey Lot, MD 12/23/23 [phone]

## 2023-12-23 NOTE — Assessment & Plan Note (Signed)
-  SIRS criteria met with  elevated white blood cell count,       Component Value Date/Time   WBC 18.8 (H) 12/23/2023 1411   LYMPHSABS 1.4 12/20/2023 0259     tachycardia   ,   fever   RR >20 Today's Vitals   12/23/23 2115 12/23/23 2130 12/23/23 2145 12/23/23 2215  BP: 112/60 131/64 120/61 122/65  Pulse: (!) 29 87 88 91  Resp: 18 18 (!) 21 (!) 32  Temp:      TempSrc:      SpO2: 97% 99% 95% 96%  PainSc:        -Most likely source being: intra-abdominal,      Patient meeting criteria for Severe sepsis with    evidence of end organ damage/organ dysfunction such as  acute metabolic encephalopathy     - Obtain serial lactic acid and procalcitonin level.  - Initiated IV antibiotics in ER: Antibiotics Given (last 72 hours)     Date/Time Action Medication Dose Rate   12/23/23 1536 New Bag/Given   cefTRIAXone  (ROCEPHIN ) 1 g in sodium chloride  0.9 % 100 mL IVPB 1 g 200 mL/hr   12/23/23 1656 New Bag/Given   metroNIDAZOLE  (FLAGYL ) IVPB 500 mg 500 mg 100 mL/hr   12/23/23 1830 New Bag/Given   vancomycin  (VANCOCIN ) IVPB 1000 mg/200 mL premix 1,000 mg 200 mL/hr       Will continue      - await results of blood and urine culture  - Rehydrate   Fluid resuscitation however was limited because  (patient is enrolled in Hospice with comfort care status OR of shared decision making with patient/ guardian, who prefer limited resuscitation with fluid  10:54 PM

## 2023-12-23 NOTE — Progress Notes (Signed)
 Pharmacy Antibiotic Note  Natasha Frederick is a 67 y.o. female admitted on 12/23/2023 with sepsis.  Pharmacy has been consulted for Vancomycin  dosing.  Plan: Vancomycin  750 mg Q24H - eAUC 484   FU 9/15 Bcx Scr around baseline (~0.5). continue to monitor Vanc levels as needed  Temp (24hrs), Avg:99.1 F (37.3 C), Min:97.8 F (36.6 C), Max:101.1 F (38.4 C)  Recent Labs  Lab 12/17/23 0533 12/17/23 1607 12/18/23 0346 12/19/23 0215 12/20/23 0259 12/23/23 1411 12/23/23 1526 12/23/23 1725  WBC 11.8* 12.5* 9.5 9.4 7.6 18.8*  --   --   CREATININE 0.44  --  0.42* 0.41* 0.36* 0.45  --   --   LATICACIDVEN  --   --   --   --   --   --  2.2* 1.1    Estimated Creatinine Clearance: 49.2 mL/min (by C-G formula based on SCr of 0.45 mg/dL).    Allergies  Allergen Reactions   Bee Venom Anaphylaxis and Swelling   Nsaids Other (See Comments)    PERFORATED ULCER = NO MORE NSAIDs   Adhesive [Tape] Other (See Comments)    redness   Codeine Nausea And Vomiting   Penicillins     Did it involve swelling of the face/tongue/throat, SOB, or low BP? Yes Did it involve sudden or severe rash/hives, skin peeling, or any reaction on the inside of your mouth or nose? Unknown Did you need to seek medical attention at a hospital or doctor's office? Yes When did it last happen?   age 34-40    If all above answers are "NO", may proceed with cephalosporin use.    Sulfa Antibiotics Itching    Antimicrobials this admission: 9/15 Ceftriaxone  >> Current 9/15 Vancomycin  >> Current 9/15 Metronidazole  >> Current  Dose adjustments this admission: None  Microbiology results: 9/15 BCx: In Progress   Thank you for allowing pharmacy to be a part of this patient's care.  [name], PharmD PGY1 Health-System Pharmacy Administration and Leadership Resident [facility]  12/23/2023 11:10 PM

## 2023-12-23 NOTE — Assessment & Plan Note (Signed)
 Chronic stable continue home meds and supportive measures

## 2023-12-24 DIAGNOSIS — Z7189 Other specified counseling: Secondary | ICD-10-CM

## 2023-12-24 DIAGNOSIS — R6520 Severe sepsis without septic shock: Secondary | ICD-10-CM | POA: Diagnosis present

## 2023-12-24 DIAGNOSIS — R4182 Altered mental status, unspecified: Secondary | ICD-10-CM | POA: Diagnosis not present

## 2023-12-24 DIAGNOSIS — R9431 Abnormal electrocardiogram [ECG] [EKG]: Secondary | ICD-10-CM | POA: Diagnosis present

## 2023-12-24 DIAGNOSIS — C259 Malignant neoplasm of pancreas, unspecified: Secondary | ICD-10-CM | POA: Diagnosis present

## 2023-12-24 DIAGNOSIS — Z8711 Personal history of peptic ulcer disease: Secondary | ICD-10-CM | POA: Diagnosis not present

## 2023-12-24 DIAGNOSIS — A419 Sepsis, unspecified organism: Secondary | ICD-10-CM | POA: Diagnosis not present

## 2023-12-24 DIAGNOSIS — Z515 Encounter for palliative care: Secondary | ICD-10-CM | POA: Diagnosis not present

## 2023-12-24 DIAGNOSIS — Z993 Dependence on wheelchair: Secondary | ICD-10-CM | POA: Diagnosis not present

## 2023-12-24 DIAGNOSIS — E46 Unspecified protein-calorie malnutrition: Secondary | ICD-10-CM | POA: Diagnosis present

## 2023-12-24 DIAGNOSIS — Z89511 Acquired absence of right leg below knee: Secondary | ICD-10-CM | POA: Diagnosis not present

## 2023-12-24 DIAGNOSIS — Z7401 Bed confinement status: Secondary | ICD-10-CM | POA: Diagnosis not present

## 2023-12-24 DIAGNOSIS — Z86718 Personal history of other venous thrombosis and embolism: Secondary | ICD-10-CM | POA: Diagnosis not present

## 2023-12-24 DIAGNOSIS — S88111S Complete traumatic amputation at level between knee and ankle, right lower leg, sequela: Secondary | ICD-10-CM | POA: Diagnosis not present

## 2023-12-24 DIAGNOSIS — G9341 Metabolic encephalopathy: Secondary | ICD-10-CM | POA: Diagnosis present

## 2023-12-24 DIAGNOSIS — G10 Huntington's disease: Secondary | ICD-10-CM | POA: Diagnosis present

## 2023-12-24 DIAGNOSIS — K8689 Other specified diseases of pancreas: Secondary | ICD-10-CM | POA: Diagnosis not present

## 2023-12-24 DIAGNOSIS — Z87891 Personal history of nicotine dependence: Secondary | ICD-10-CM | POA: Diagnosis not present

## 2023-12-24 DIAGNOSIS — Z96642 Presence of left artificial hip joint: Secondary | ICD-10-CM | POA: Diagnosis present

## 2023-12-24 DIAGNOSIS — K5900 Constipation, unspecified: Secondary | ICD-10-CM | POA: Diagnosis present

## 2023-12-24 DIAGNOSIS — E872 Acidosis, unspecified: Secondary | ICD-10-CM | POA: Diagnosis present

## 2023-12-24 DIAGNOSIS — Z1152 Encounter for screening for COVID-19: Secondary | ICD-10-CM | POA: Diagnosis not present

## 2023-12-24 DIAGNOSIS — R140 Abdominal distension (gaseous): Secondary | ICD-10-CM | POA: Diagnosis not present

## 2023-12-24 DIAGNOSIS — K831 Obstruction of bile duct: Secondary | ICD-10-CM | POA: Diagnosis present

## 2023-12-24 DIAGNOSIS — K219 Gastro-esophageal reflux disease without esophagitis: Secondary | ICD-10-CM | POA: Diagnosis present

## 2023-12-24 DIAGNOSIS — Z66 Do not resuscitate: Secondary | ICD-10-CM | POA: Diagnosis present

## 2023-12-24 DIAGNOSIS — C801 Malignant (primary) neoplasm, unspecified: Secondary | ICD-10-CM | POA: Diagnosis present

## 2023-12-24 DIAGNOSIS — E876 Hypokalemia: Secondary | ICD-10-CM | POA: Diagnosis present

## 2023-12-24 DIAGNOSIS — Z4682 Encounter for fitting and adjustment of non-vascular catheter: Secondary | ICD-10-CM | POA: Diagnosis not present

## 2023-12-24 DIAGNOSIS — D630 Anemia in neoplastic disease: Secondary | ICD-10-CM | POA: Diagnosis present

## 2023-12-24 LAB — COMPREHENSIVE METABOLIC PANEL WITH GFR
ALT: 141 U/L — ABNORMAL HIGH (ref 0–44)
AST: 98 U/L — ABNORMAL HIGH (ref 15–41)
Albumin: 2.1 g/dL — ABNORMAL LOW (ref 3.5–5.0)
Alkaline Phosphatase: 686 U/L — ABNORMAL HIGH (ref 38–126)
Anion gap: 12 (ref 5–15)
BUN: 7 mg/dL — ABNORMAL LOW (ref 8–23)
CO2: 22 mmol/L (ref 22–32)
Calcium: 8.4 mg/dL — ABNORMAL LOW (ref 8.9–10.3)
Chloride: 101 mmol/L (ref 98–111)
Creatinine, Ser: 0.37 mg/dL — ABNORMAL LOW (ref 0.44–1.00)
GFR, Estimated: >60 mL/min (ref >60)
Glucose, Bld: 110 mg/dL — ABNORMAL HIGH (ref 70–99)
Potassium: 3.4 mmol/L — ABNORMAL LOW (ref 3.5–5.1)
Sodium: 135 mmol/L (ref 135–145)
Total Bilirubin: 5.5 mg/dL — ABNORMAL HIGH (ref 0.0–1.2)
Total Protein: 4.5 g/dL — ABNORMAL LOW (ref 6.5–8.1)

## 2023-12-24 LAB — CBC
HCT: 30.4 % — ABNORMAL LOW (ref 36.0–46.0)
Hemoglobin: 10.4 g/dL — ABNORMAL LOW (ref 12.0–15.0)
MCH: 29.7 pg (ref 26.0–34.0)
MCHC: 34.2 g/dL (ref 30.0–36.0)
MCV: 86.9 fL (ref 80.0–100.0)
Platelets: 312 K/uL (ref 150–400)
RBC: 3.5 MIL/uL — ABNORMAL LOW (ref 3.87–5.11)
RDW: 17 % — ABNORMAL HIGH (ref 11.5–15.5)
WBC: 11.6 K/uL — ABNORMAL HIGH (ref 4.0–10.5)
nRBC: 0 % (ref 0.0–0.2)

## 2023-12-24 LAB — AEROBIC/ANAEROBIC CULTURE W GRAM STAIN (SURGICAL/DEEP WOUND)
Culture: NO GROWTH
Gram Stain: NONE SEEN

## 2023-12-24 MED ORDER — POTASSIUM CHLORIDE CRYS ER 20 MEQ PO TBCR
40.0000 meq | EXTENDED_RELEASE_TABLET | Freq: Once | ORAL | Status: AC
  Administered 2023-12-24: 40 meq via ORAL
  Filled 2023-12-24: qty 2

## 2023-12-24 MED ORDER — POLYETHYLENE GLYCOL 3350 17 G PO PACK: 17.0000 g | PACK | Freq: Two times a day (BID) | ORAL | Status: DC | PRN

## 2023-12-24 MED ORDER — POLYETHYLENE GLYCOL 3350 17 G PO PACK
17.0000 g | PACK | Freq: Two times a day (BID) | ORAL | Status: AC
  Administered 2023-12-24: 17 g via ORAL
  Filled 2023-12-24: qty 1

## 2023-12-24 MED ORDER — BISACODYL 10 MG RE SUPP
10.0000 mg | Freq: Every day | RECTAL | Status: AC
  Administered 2023-12-24 – 2023-12-25 (×2): 10 mg via RECTAL
  Filled 2023-12-24 (×2): qty 1

## 2023-12-24 MED ORDER — BISACODYL 10 MG RE SUPP: 10.0000 mg | Freq: Every day | RECTAL | Status: DC | PRN

## 2023-12-24 NOTE — Care Management Obs Status (Signed)
 MEDICARE OBSERVATION STATUS NOTIFICATION   Patient Details  Name: Natasha Frederick MRN: [id number] Date of Birth: 1955/11/13   Medicare Observation Status Notification Given:  Yes  Spoke with the patient husband about the obs status  he ask the I left a copy in her room for  him  Claretta Deed 12/24/2023, 2:01 PM

## 2023-12-24 NOTE — Consult Note (Signed)
 Consultation Note Date: 12/24/2023   Patient Name: Natasha Frederick  DOB: 1955-04-18  MRN: [id number]  Age / Sex: 68 y.o., female  PCP: Micheal Wolm ORN, MD Referring Physician: Kathrin Mignon DASEN, MD  Reason for Consultation:  transition to comfort care, prevent readmition)  HPI/Patient Profile: 68 y.o. female  with past medical history of Huntington disease, dysphagia, DVT, endometriosis, PUD with perforation in 2020, pancreatic mass- adenocarcinoma, CBD s/p stent placement, s/p R BKA,  admitted on 12/23/2023 with fever, vomiting, altered mental status. She was started on broad spectrum antibiotics for sepsis. Palliative medicine consulted fro medical decision making regarding treatment options.    Primary Decision Maker NEXT OF KIN - pt spouse- Ezzard Hover  Discussion: Chart reviewed including labs, progress notes, imaging from this and previous encounters.  CT scan this admission reveals- severe pancreatic duct dilatation, severe intrahepatic and extrahepatic biliary dilatation, constipation with impaction, possible fluid around L hip- previously biopsied. Pulmonary nodules concerning for metastasis.  Labs reviewed- significant for bili up at 5.5, AST/ALT- 98/141, Cr stable, albumin  is low at 2.1. She has elevated WBC 11.6. Lactic acid was elevated 2.2 on admit- down to 1.1 today. Blood cultures with no growth to date. Resp panel negative. UA is pending. Procalcitonin  Noted per discharge summary from 9/8-9/12 admission- plan was for patient to discharge to [facility] with hospice support.  Discussed with [facility] liasion Amy, RN- per Amy referral was received but patient was not admitted due to difficulty reaching patient's spouse.  On evaluation patient was sleeping- did not wake to my voice.  Called and spoke with spouse, Lewis via phone.  I reviewed patient's labs and CT scan results with Lewis. Discussed she  is being treated with antibiotics and IV fluids for sepsis. I shared my concern this is a result of her pancreatic mass and stent and that she may have recurrent episodes in the future.  Since discharge Sharonica has been bed/wheelchair bound. She is not eating or drinking a great deal.  We discussed goals of care for [place] for this hospitalization and overall.  Discussed options for continuing current interventions with attempts to reverse potential infectious process vs comfort measures only. Discussed transition to comfort measures only which includes stopping IV fluids, antibiotics, labs and providing symptom management for SOB, anxiety, nausea, vomiting, and other symptoms of dying.  Lewis shares that goal at this time is to continue to treat what is treatable. Continue IV fluids and antibiotics. If patient has continued decline then would transition to comfort.  He would like patient to be discharged back to [facility] with hospice services in place. If she declines at [facility] he would only wanted her readmitted to the hospital for comfort measures- would not want further tests or treatments.      SUMMARY OF RECOMMENDATIONS -Sepsis- unclear origin- treat what is treatable, continue IV fluids, IV antibiotics -Pancreatic cancer- comfort measures, hopeful for discharge back to [facility] with hospice -PMT will continue to follow    Code Status/Advance Care Planning:  Code Status: Do not attempt resuscitation (DNR) - Comfort care    Prognosis:   < 3 months  Discharge Planning: Home with Hospice  Primary Diagnoses: Present on Admission:  Pancreatic mass  Huntington disease (HCC)  Sepsis (HCC)  Obstipation  Below-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)  Prolonged QT interval   Review of Systems  Unable to perform ROS: Mental status change    Physical Exam Vitals and nursing note reviewed.  Abdominal:     General: Abdomen is flat.  Skin:    Coloration: Skin is  jaundiced.  Neurological:     Comments: Bil UE contracted     Vital Signs: BP 114/69   Pulse 78   Temp 98 F (36.7 C)   Resp 16   SpO2 95%  Pain Scale: 0-10   Pain Score: 0-No pain   SpO2: SpO2: 95 % O2 Device:SpO2: 95 % O2 Flow Rate: .   IO: Intake/output summary:  Intake/Output Summary (Last 24 hours) at 12/24/2023 1418 Last data filed at 12/24/2023 0300 Gross per 24 hour  Intake 386.52 ml  Output --  Net 386.52 ml    LBM: Last BM Date : 12/24/23 Baseline Weight:   Most recent weight:         Thank you for this consult. Palliative medicine will continue to follow and assist as needed.   Signed by: Cassondra Stain, AGNP-C Palliative Medicine  Time includes:   Preparing to see the patient (e.g., review of tests) Obtaining and/or reviewing separately obtained history Performing a medically necessary appropriate examination and/or evaluation Counseling and educating the patient/family/caregiver Ordering medications, tests, or procedures Referring and communicating with other health care professionals (when not reported separately) Documenting clinical information in the electronic or other health record Independently interpreting results (not reported separately) and communicating results to the patient/family/caregiver Care coordination (not reported separately) Clinical documentation   Please contact Palliative Medicine Team phone at [phone] for questions and concerns.  For individual provider: See Tracey

## 2023-12-24 NOTE — Progress Notes (Signed)
 PROGRESS NOTE  Natasha Frederick FMW:[id number] DOB: 1955/10/21   PCP: Micheal Wolm ORN, MD  Patient is from: Home.  DOA: 12/23/2023 LOS: 0  Chief complaints No chief complaint on file.    Brief Narrative / Interim history: 68 year old F with PMH of Huntington's disease, right BKA, DVT, PUD, endometriosis, dysphagia, vitamin D deficiency, constipation, prior perforated gastric ulcer in 2020, GERD, pancreatic mass and CBD stricture s/p ERCP and stenting presenting with nausea, vomiting, abdominal pain, increased weakness and fever.  Increased weakness for about 2 weeks.  Febrile to 101.1.  Patient was hospitalized from 9/8-9/12 with obstructive jaundice for which she underwent ERCP, placement of plastic stent and biopsy, and discharged to SNF with palliative follow-up for comfort care.  Pathology from prior ERCP suggested adenocarcinoma likely of pancreatic origin.  Per GI note, family not interested in pursuing oncology evaluation or treatment.  In ED, febrile to 101.1.  WBC 18.8 with left shift.  LFT, bilirubin and ALP increased from prior values.  CT abdomen and pelvis with severe unchanged intrahepatic and extrahepatic biliary dilation, severe pancreatic duct dilation with transition point near the pancreatic head and neck, poorly defined hypodense lesion at the pancreatic head and neck region measuring up to 1.4 cm suggesting pancreatic neoplasm and marked narrowing of proximal splenic vein and portal venous confluence suggesting for extrinsic compression or tumor involvement, rectal fecal impaction and other nonspecific finding.    Cultures obtained.  Patient was started on broad-spectrum antibiotics and admitted.   Subjective: Seen and examined earlier this morning.  No major events overnight or this morning.  Reports some abdominal pain and nausea.  Denies chest pain or shortness of breath.  Objective: Vitals:   12/24/23 0340 12/24/23 0448 12/24/23 0753 12/24/23 1249  BP: 114/81 129/71  137/73 114/69  Pulse: 80 87 (!) 101 78  Resp: 20 16    Temp: 99 F (37.2 C) 98.7 F (37.1 C) 98 F (36.7 C) 98 F (36.7 C)  TempSrc:      SpO2: 93% 100% 97% 95%    Examination:  GENERAL: No apparent distress.  Nontoxic. HEENT: MMM.  Oropharyngeal secretion?  Vision and hearing grossly intact.  NECK: Supple.  No apparent JVD.  RESP:  No IWOB.  Fair aeration bilaterally. CVS: Regular rhythm.  Normal rate.  Heart sounds normal.  ABD/GI/GU: BS+. Abd soft, NTND.  MSK/EXT:  Moves extremities.  Right BKA.  Some deformity in her hands, right> left. SKIN: no apparent skin lesion or wound NEURO: AA.  Oriented to self and place but not time.  No apparent focal neuro deficit. PSYCH: Calm. Normal affect.   Consultants:  Palliative medicine  Procedures: None  Microbiology summarized: COVID-19, influenza and RSV PCR nonreactive Blood cultures NGTD  Assessment and plan: Severe sepsis due to possible intra-abdominal infection: Present on admission.  Has a leukocytosis, tachypnea and lactic acidosis on presentation.  Blood cultures NGTD.  Suspect intra-abdominal infection -Continue  ceftriaxone  and Flagyl . -Discontinue vancomycin . -Sepsis physiology resolving.  Adenocarcinoma of pancreas Obstructive jaundice s/p biliary stent insertion on 9/9 Elevated liver enzymes/hyperbilirubinemia: Improved. -Patient's husband not interested in pursuing oncologic evaluation or treatment -Emphasis on comfort care.    Huntington disease [facility]): Chronic and stable. -Continue home meds and supportive measures -Continue Risperdal  and amantadine .   Obstipation-has had a small bowel movement this morning. -Scheduled Dulcolax of history and MiraLAX  -KUB in the morning   Prolonged QT interval -Monitor QTc - Avoid QT prolonging drugs - Optimize electrolytes  Hypokalemia -Monitor replenish K and  Mg as appropriate  Right BKA-noted  There is no height or weight on file to calculate BMI.           DVT prophylaxis:  SCDs Start: 12/23/23 2339  Code Status: DNR-comfort Family Communication: None at bedside Level of care: Med-Surg Status is: Inpatient Remains inpatient appropriate because: Severe sepsis   Final disposition: To be determined   55 minutes with more than 50% spent in reviewing records, counseling patient/family and coordinating care.   Sch Meds:  Scheduled Meds:  amantadine   100 mg Oral BID   bisacodyl   10 mg Rectal Daily   cycloSPORINE   1 drop Both Eyes BID   docusate sodium   100 mg Oral BID   fluorometholone   1 drop Both Eyes BID   LORazepam   0.5 mg Oral Daily   mirabegron  ER  25 mg Oral Daily   pantoprazole   40 mg Oral Daily   polyethylene glycol  17 g Oral BID   risperiDONE   1 mg Oral Daily   Continuous Infusions:  cefTRIAXone  (ROCEPHIN )  IV 2 g (12/24/23 0903)   metronidazole  500 mg (12/24/23 1045)   PRN Meds:.acetaminophen  **OR** acetaminophen , albuterol , bisacodyl  **FOLLOWED BY** [START ON 12/26/2023] bisacodyl , HYDROmorphone  (DILAUDID ) injection, methocarbamol  (ROBAXIN ) injection, milk and molasses, ondansetron  **OR** ondansetron  (ZOFRAN ) IV, polyethylene glycol **FOLLOWED BY** [START ON 12/25/2023] polyethylene glycol  Antimicrobials: Anti-infectives (From admission, onward)    Start     Dose/Rate Route Frequency Ordered Stop   12/24/23 1800  vancomycin  (VANCOREADY) IVPB 750 mg/150 mL  Status:  Discontinued        750 mg 150 mL/hr over 60 Minutes Intravenous Every 24 hours 12/23/23 2309 12/24/23 1517   12/24/23 1000  cefTRIAXone  (ROCEPHIN ) 2 g in sodium chloride  0.9 % 100 mL IVPB        2 g 200 mL/hr over 30 Minutes Intravenous Every 24 hours 12/23/23 2254     12/23/23 2300  metroNIDAZOLE  (FLAGYL ) IVPB 500 mg        500 mg 100 mL/hr over 60 Minutes Intravenous Every 12 hours 12/23/23 2254     12/23/23 1730  vancomycin  (VANCOCIN ) IVPB 1000 mg/200 mL premix        1,000 mg 200 mL/hr over 60 Minutes Intravenous  Once 12/23/23 1716 12/23/23  2027   12/23/23 1530  cefTRIAXone  (ROCEPHIN ) 1 g in sodium chloride  0.9 % 100 mL IVPB        1 g 200 mL/hr over 30 Minutes Intravenous  Once 12/23/23 1520 12/23/23 1647   12/23/23 1530  metroNIDAZOLE  (FLAGYL ) IVPB 500 mg        500 mg 100 mL/hr over 60 Minutes Intravenous  Once 12/23/23 1520 12/23/23 1830        I have personally reviewed the following labs and images: CBC: Recent Labs  Lab 12/18/23 0346 12/19/23 0215 12/20/23 0259 12/23/23 1411 12/24/23 0538  WBC 9.5 9.4 7.6 18.8* 11.6*  NEUTROABS 7.2 7.1 4.8  --   --   HGB 10.9* 10.2* 10.0* 11.2* 10.4*  HCT 33.5* 31.0* 30.8* 34.2* 30.4*  MCV 88.6 86.6 88.8 90.2 86.9  PLT 362 356 159 360 312   BMP &GFR Recent Labs  Lab 12/18/23 0346 12/19/23 0215 12/20/23 0259 12/23/23 1411 12/24/23 0538  NA 142 140 142 138 135  K 3.6 2.9* 4.0 3.3* 3.4*  CL 111 109 111 104 101  CO2 18* 19* 16* 20* 22  GLUCOSE 123* 90 96 126* 110*  BUN 10 6* 6* 7* 7*  CREATININE 0.42* 0.41*  0.36* 0.45 0.37*  CALCIUM  8.9 8.4* 8.1* 8.9 8.4*  MG 1.6* 1.5* 2.1  --   --    Estimated Creatinine Clearance: 49.2 mL/min (A) (by C-G formula based on SCr of 0.37 mg/dL (L)). Liver & Pancreas: Recent Labs  Lab 12/18/23 0346 12/19/23 0215 12/20/23 0259 12/23/23 1411 12/24/23 0538  AST 72* 39 61* 183* 98*  ALT 172* 120* 113* 193* 141*  ALKPHOS 852* 675* 596* 910* 686*  BILITOT 3.6* 3.0* 2.6* 5.7* 5.5*  PROT 5.3* 4.8* 4.5* 5.8* 4.5*  ALBUMIN  2.6* 2.4* 2.3* 2.8* 2.1*   Recent Labs  Lab 12/23/23 1411  LIPASE 43   No results for input(s): AMMONIA in the last 168 hours. Diabetic: No results for input(s): HGBA1C in the last 72 hours. No results for input(s): GLUCAP in the last 168 hours. Cardiac Enzymes: No results for input(s): CKTOTAL, CKMB, CKMBINDEX, TROPONINI in the last 168 hours. No results for input(s): PROBNP in the last 8760 hours. Coagulation Profile: Recent Labs  Lab 12/18/23 0346  INR 1.0   Thyroid  Function  Tests: No results for input(s): TSH, T4TOTAL, FREET4, T3FREE, THYROIDAB in the last 72 hours. Lipid Profile: No results for input(s): CHOL, HDL, LDLCALC, TRIG, CHOLHDL, LDLDIRECT in the last 72 hours. Anemia Panel: No results for input(s): VITAMINB12, FOLATE, FERRITIN, TIBC, IRON, RETICCTPCT in the last 72 hours. Urine analysis:    Component Value Date/Time   COLORURINE YELLOW 03/27/2023 1249   APPEARANCEUR HAZY (A) 03/27/2023 1249   LABSPEC 1.024 03/27/2023 1249   PHURINE 5.0 03/27/2023 1249   GLUCOSEU NEGATIVE 03/27/2023 1249   GLUCOSEU NEGATIVE 08/01/2022 1250   HGBUR NEGATIVE 03/27/2023 1249   BILIRUBINUR NEGATIVE 03/27/2023 1249   KETONESUR NEGATIVE 03/27/2023 1249   PROTEINUR NEGATIVE 03/27/2023 1249   UROBILINOGEN 0.2 08/01/2022 1250   NITRITE NEGATIVE 03/27/2023 1249   LEUKOCYTESUR LARGE (A) 03/27/2023 1249   Sepsis Labs: Invalid input(s): PROCALCITONIN, LACTICIDVEN  Microbiology: Recent Results (from the past 240 hours)  Resp panel by RT-PCR (RSV, Flu A&B, Covid) Anterior Nasal Swab     Status: None   Collection Time: 12/16/23  8:42 AM   Specimen: Anterior Nasal Swab  Result Value Ref Range Status   SARS Coronavirus 2 by RT PCR NEGATIVE NEGATIVE Final   Influenza A by PCR NEGATIVE NEGATIVE Final   Influenza B by PCR NEGATIVE NEGATIVE Final    Comment: (NOTE) The Xpert Xpress SARS-CoV-2/FLU/RSV plus assay is intended as an aid in the diagnosis of influenza from Nasopharyngeal swab specimens and should not be used as a sole basis for treatment. Nasal washings and aspirates are unacceptable for Xpert Xpress SARS-CoV-2/FLU/RSV testing.  Fact Sheet for Patients: BloggerCourse.com  Fact Sheet for Healthcare Providers: SeriousBroker.it  This test is not yet approved or cleared by the [place]  FDA and has been authorized for detection and/or diagnosis of SARS-CoV-2 by FDA  under an Emergency Use Authorization (EUA). This EUA will remain in effect (meaning this test can be used) for the duration of the COVID-19 declaration under Section 564(b)(1) of the Act, 21 U.S.C. section 360bbb-3(b)(1), unless the authorization is terminated or revoked.     Resp Syncytial Virus by PCR NEGATIVE NEGATIVE Final    Comment: (NOTE) Fact Sheet for Patients: BloggerCourse.com  Fact Sheet for Healthcare Providers: SeriousBroker.it  This test is not yet approved or cleared by the [place]  FDA and has been authorized for detection and/or diagnosis of SARS-CoV-2 by FDA under an Emergency Use Authorization (EUA). This EUA will remain in effect (  meaning this test can be used) for the duration of the COVID-19 declaration under Section 564(b)(1) of the Act, 21 U.S.C. section 360bbb-3(b)(1), unless the authorization is terminated or revoked.  Performed at [facility], [address] [place], [place] [address]   MRSA Next Gen by PCR, Nasal     Status: None   Collection Time: 12/17/23  9:32 AM   Specimen: Nasal Mucosa; Nasal Swab  Result Value Ref Range Status   MRSA by PCR Next Gen NOT DETECTED NOT DETECTED Final    Comment: (NOTE) The GeneXpert MRSA Assay (FDA approved for NASAL specimens only), is one component of a comprehensive MRSA colonization surveillance program. It is not intended to diagnose MRSA infection nor to guide or monitor treatment for MRSA infections. Test performance is not FDA approved in patients less than 30 years old. Performed at [facility], [address], [place], [place] [address]   Aerobic/Anaerobic Culture w Gram Stain (surgical/deep wound)     Status: None   Collection Time: 12/18/23  3:08 PM   Specimen: Skin, Cyst/Tag/Debridement; Tissue  Result Value Ref Range Status   Specimen Description SKIN  Final   Special Requests CORE RIGHT  Final   Gram Stain NO WBC SEEN NO  ORGANISMS SEEN   Final   Culture   Final    No growth aerobically or anaerobically. Performed at [facility], [address], [place], [place] [address]    Report Status 12/24/2023 FINAL  Final  Resp panel by RT-PCR (RSV, Flu A&B, Covid) Anterior Nasal Swab     Status: None   Collection Time: 12/23/23  2:11 PM   Specimen: Anterior Nasal Swab  Result Value Ref Range Status   SARS Coronavirus 2 by RT PCR NEGATIVE NEGATIVE Final   Influenza A by PCR NEGATIVE NEGATIVE Final   Influenza B by PCR NEGATIVE NEGATIVE Final    Comment: (NOTE) The Xpert Xpress SARS-CoV-2/FLU/RSV plus assay is intended as an aid in the diagnosis of influenza from Nasopharyngeal swab specimens and should not be used as a sole basis for treatment. Nasal washings and aspirates are unacceptable for Xpert Xpress SARS-CoV-2/FLU/RSV testing.  Fact Sheet for Patients: BloggerCourse.com  Fact Sheet for Healthcare Providers: SeriousBroker.it  This test is not yet approved or cleared by the [place]  FDA and has been authorized for detection and/or diagnosis of SARS-CoV-2 by FDA under an Emergency Use Authorization (EUA). This EUA will remain in effect (meaning this test can be used) for the duration of the COVID-19 declaration under Section 564(b)(1) of the Act, 21 U.S.C. section 360bbb-3(b)(1), unless the authorization is terminated or revoked.     Resp Syncytial Virus by PCR NEGATIVE NEGATIVE Final    Comment: (NOTE) Fact Sheet for Patients: BloggerCourse.com  Fact Sheet for Healthcare Providers: SeriousBroker.it  This test is not yet approved or cleared by the [place]  FDA and has been authorized for detection and/or diagnosis of SARS-CoV-2 by FDA under an Emergency Use Authorization (EUA). This EUA will remain in effect (meaning this test can be used) for the duration of the COVID-19  declaration under Section 564(b)(1) of the Act, 21 U.S.C. section 360bbb-3(b)(1), unless the authorization is terminated or revoked.  Performed at [facility], [address], [place], [place] [address]   Blood culture (routine x 2)     Status: None (Preliminary result)   Collection Time: 12/23/23  2:11 PM   Specimen: BLOOD RIGHT ARM  Result Value Ref Range  Status   Specimen Description BLOOD RIGHT ARM  Final   Special Requests   Final    BOTTLES DRAWN AEROBIC AND ANAEROBIC Blood Culture results may not be optimal due to an inadequate volume of blood received in culture bottles   Culture   Final    NO GROWTH < 24 HOURS Performed at [facility], [address], [place], [place] [address]    Report Status PENDING  Incomplete  Blood culture (routine x 2)     Status: None (Preliminary result)   Collection Time: 12/23/23  5:08 PM   Specimen: BLOOD RIGHT HAND  Result Value Ref Range Status   Specimen Description BLOOD RIGHT HAND  Final   Special Requests   Final    BOTTLES DRAWN AEROBIC ONLY Blood Culture results may not be optimal due to an inadequate volume of blood received in culture bottles   Culture   Final    NO GROWTH < 24 HOURS Performed at [facility], [address] [place], [place] [address]    Report Status PENDING  Incomplete    Radiology Studies: CT ABDOMEN PELVIS W CONTRAST Result Date: 12/23/2023 CLINICAL DATA:  Abdominal pain, acute, nonlocalized. EXAM: CT ABDOMEN AND PELVIS WITH CONTRAST TECHNIQUE: Multidetector CT imaging of the abdomen and pelvis was performed using the standard protocol following bolus administration of intravenous contrast. RADIATION DOSE REDUCTION: This exam was performed according to the departmental dose-optimization program which includes automated exposure control, adjustment of the mA and/or kV according to patient size and/or use of iterative reconstruction technique. CONTRAST:  75mL OMNIPAQUE  IOHEXOL  350 MG/ML SOLN  COMPARISON:  12/16/2023 FINDINGS: Lower chest: Again noted is a 4 mm nodule in the right lower lobe on image 2/5. At least 2 additional small nodules in the right middle lobe on image 4/5. No pleural effusions. Hepatobiliary: Severe intrahepatic and extrahepatic biliary dilatation has not changed. Interval placement of a plastic biliary stent that extends into the right intrahepatic bile ducts. Distal aspect of the stent is near the ampulla. Gallbladder remains moderately distended. Limited evaluation for intrahepatic lesions due to the severe biliary distension. Mild stranding around the gallbladder is similar to the previous examination. Pancreas: Again noted is severe pancreatic duct dilatation with the transition point near the pancreatic neck and head. Poorly defined hypodense lesion at the pancreatic head-neck region is again noted measuring 1.4 x 1.2 cm on image 31/3. Limited evaluation of the pancreatic head due to artifact from the spinal hardware. Stranding and inflammatory changes around the pancreatic head. Spleen: Spleen is small with a nodular configuration. Some irregular hypodensities along the left superior aspect of the spleen are similar to a chest CT from 07/14/2021. Adrenals/Urinary Tract: Normal appearance of the adrenal glands. Normal appearance of both kidneys without hydronephrosis. Normal appearance of the urinary bladder but limited evaluation due to artifact from the left hip arthroplasty. Stomach/Bowel: Again noted is severe distension of the rectum with a large amount of stool. Findings are suggestive for rectal impaction. Diverticula involving the sigmoid colon. Mild dilatation of the stomach. Vascular/Lymphatic: Atherosclerotic disease in the abdominal aorta without aneurysm. Indeterminate densities / soft tissue around the SMA on image 29/3. Mildly prominent lymph nodes in the porta hepatis and precaval region. Main portal vein is patent but there is marked narrowing and attenuation  of the proximal splenic vein and the portal venous confluence on image 29/3. Findings are similar to the previous examination and concerning for tumor involvement or extrinsic compression. Reproductive: Limited  evaluation due to the rectal distension and left hip arthroplasty. No evidence for an adnexal mass. Other: Again noted is poorly defined low-density in the soft tissue around the right ischial tuberosity on image 82/3. This area has been previously biopsied. No significant bone destruction in the adjacent right ischial tuberosity. This low-density area measures 2.3 x 2.9 cm. Negative for ascites. Negative for free air. Musculoskeletal: Left hip arthroplasty is located. Levoscoliosis in lumbar spine. Posterior lumbar interbody fusion with bilateral pedicle screws and rods at L2, L3 and L4. IMPRESSION: 1. Interval placement of a plastic biliary stent. Severe intrahepatic and extrahepatic biliary dilatation has not significantly changed. 2. Severe pancreatic duct dilatation with transition point near the pancreatic head and neck. There is a poorly defined hypodense lesion at the pancreatic head-neck region measuring up to 1.4 cm. Findings are suggestive for a pancreatic neoplasm. 3. Marked narrowing of the proximal splenic vein and portal venous confluence. Findings are similar to the previous examination and concerning for tumor involvement or extrinsic compression. 4. Severe distension of the rectum with a large amount of stool. Findings are suggestive for rectal stool impaction. 5. Indeterminate soft tissue around the SMA. Mildly prominent lymph nodes in the porta hepatis and precaval region. Findings are concerning for neoplastic disease. 6. Indeterminate low-density in the soft tissue around the right ischial tuberosity. This area has been previously biopsied. No significant bone destruction in the adjacent right ischial tuberosity. 7. Small nodules in the right middle lobe and right lower lobe. These  nodules are indeterminate but metastatic disease cannot be excluded. 8. Aortic Atherosclerosis (ICD10-I70.0). Electronically Signed   By: Juliene Balder M.D.   On: 12/23/2023 17:20   CT Cervical Spine Wo Contrast Result Date: 12/23/2023 EXAM: CT CERVICAL SPINE WITHOUT CONTRAST 12/23/2023 04:42:18 PM TECHNIQUE: CT of the cervical spine was performed without the administration of intravenous contrast. Multiplanar reformatted images are provided for review. Automated exposure control, iterative reconstruction, and/or weight based adjustment of the mA/kV was utilized to reduce the radiation dose to as low as reasonably achievable. COMPARISON: 04/01/2023 CLINICAL HISTORY: Neck trauma (Age >= 65y). FINDINGS: CERVICAL SPINE: BONES AND ALIGNMENT: Similar alignment with reversal of the normal cervical lordosis. No facet dislocation. Dextrocurvature of the cervical spine. Similar clipal file type deformity of the cervical spine with fusion of the C3 through C6 vertebral bodies. There is also likely partial fusion of C6 and C7 vertebral bodies. Diffuse osteopenia. DEGENERATIVE CHANGES: Degenerative endplate changes greatest at C5-6. SOFT TISSUES: No prevertebral soft tissue swelling. IMPRESSION: 1. No acute abnormality of the cervical spine related to the reported neck trauma. 2. 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 Electronically signed by: Donnice Mania MD 12/23/2023 05:08 PM EDT RP Workstation: [id number]   CT Head Wo Contrast Result Date: 12/23/2023 EXAM: CT HEAD WITHOUT CONTRAST 12/23/2023 04:42:18 PM TECHNIQUE: CT of the head was performed without the administration of intravenous contrast. Automated exposure control, iterative reconstruction, and/or weight based adjustment of the mA/kV was utilized to reduce the radiation dose to as low as reasonably achievable. COMPARISON: CT head 12/16/2023 and 04/01/2023. CLINICAL  HISTORY: Head trauma, minor (Age >= 65y). FINDINGS: BRAIN AND VENTRICLES: No acute hemorrhage. No evidence of acute infarct. Ventriculomegaly is similar to prior, likely related to volume loss. Similar encephalomalacia in the anterior inferior frontal lobes, likely in the setting of prior trauma. Additional encephalomalacia in the left cerebellum. Nonspecific hypoattenuation in the periventricular  and subcortical white matter, most likely representing chronic microvascular ischemic changes. ORBITS: No acute abnormality. SINUSES: No acute abnormality. SOFT TISSUES AND SKULL: No acute soft tissue abnormality. No skull fracture. Mild left mastoid effusion. IMPRESSION: 1. No acute intracranial abnormality. 2. Moderate parenchymal volume loss. 3. Encephalomalacia in the anterior inferior frontal lobes, likely in the setting of prior trauma, and in the left cerebellum. Electronically signed by: Donnice Mania MD 12/23/2023 05:00 PM EDT RP Workstation: [id number]      Albirtha Grinage T. Janene Yousuf [facility]  If 7PM-7AM, please contact night-coverage [email or web address] 12/24/2023, 3:18 PM

## 2023-12-24 NOTE — TOC Progression Note (Signed)
 Transition of Care [facility]) - Progression Note    Patient Details  Name: Natasha Frederick MRN: [id number] Date of Birth: 1955-09-18  [facility] CM/SW Contact  Sherline Clack, [place] Phone Number: 12/24/2023, 3:20 PM  Clinical Narrative:     CSW spoke on the phone with Shasta at [facility]. Ana told CSW the facility is able to accept patient back whenever and requested a new FL2 be sent. CSW will send FL2 to facility in anticipation to discharge. CSW will continue to follow.                     Expected Discharge Plan and Services                                               Social Drivers of Health (SDOH) Interventions SDOH Screenings   Food Insecurity: Patient Unable To Answer (12/24/2023)  Housing: Unknown (12/24/2023)  Transportation Needs: Patient Unable To Answer (12/24/2023)  Utilities: Patient Unable To Answer (12/24/2023)  Alcohol Screen: Low Risk  (09/26/2022)  Depression (PHQ2-9): Low Risk  (09/26/2022)  Financial Resource Strain: Low Risk  (09/21/2021)  Physical Activity: Insufficiently Active (09/26/2022)  Social Connections: Patient Unable To Answer (12/24/2023)  Stress: No Stress Concern Present (09/26/2022)  Tobacco Use: Medium Risk (12/23/2023)    Readmission Risk Interventions     No data to display

## 2023-12-24 NOTE — Plan of Care (Signed)

## 2023-12-24 NOTE — [place] FL2 (Signed)
 [place]  MEDICAID FL2 LEVEL OF CARE FORM     IDENTIFICATION  Patient Name: Natasha Frederick Birthdate: 25-Mar-1956 Sex: female Admission Date (Current Location): 12/23/2023  [facility] and [place] Number:  Producer, television/film/video and Address:  [facility], [address] [place], [place] [address]      Provider Number: [id number]  Attending Physician Name and Address:  Kathrin Mignon DASEN, MD  Relative Name and Phone Number:       Current Level of Care: Hospital Recommended Level of Care: Skilled Nursing Facility Prior Approval Number:    Date Approved/Denied:   PASRR Number: [id number]  Discharge Plan: SNF    Current Diagnoses: Patient Active Problem List   Diagnosis Date Noted   Sepsis (HCC) 12/23/2023   Obstipation 12/23/2023   Prolonged QT interval 12/23/2023   Duodenitis 12/17/2023   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   Pancreatic mass 12/16/2023   Obstructive jaundice 12/16/2023   Abscess 12/16/2023   Thrombocytosis 12/16/2023   Hx of below knee amputation, right (HCC) 12/16/2023   GERD (gastroesophageal reflux disease) 11/27/2021   Osteoporosis 11/23/2021   Depression, recurrent (HCC) 05/12/2019   Below-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) 01/26/2019   Anxiety and depression 12/21/2018   Chronic back pain 11/05/2018   Cold right foot 08/06/2018   Delirium 08/01/2018   Perforated gastric ulcer s/p omental patch 07/30/2018 07/31/2018   ARF (acute renal failure) (HCC) 07/30/2018   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   Primary osteoarthritis of left hip 07/19/2014   Scoliosis of lumbar spine 01/05/2014    Orientation RESPIRATION BLADDER Height & Weight     Situation, Place  Normal Incontinent Weight:   Height:     BEHAVIORAL SYMPTOMS/MOOD NEUROLOGICAL BOWEL NUTRITION STATUS      Incontinent Diet (please refer to dc summary)  AMBULATORY STATUS COMMUNICATION OF NEEDS Skin   Extensive Assist Verbally Surgical wounds (surgical incision  lumbar right)                       Personal Care Assistance Level of Assistance  Bathing, Feeding, Dressing Bathing Assistance: Maximum assistance Feeding assistance: Limited assistance Dressing Assistance: Maximum assistance     Functional Limitations Info  Sight, Hearing, Speech Sight Info: Adequate Hearing Info: Adequate Speech Info: Impaired (difficulty speaking)    SPECIAL CARE FACTORS FREQUENCY                       Contractures Contractures Info: Not present    Additional Factors Info  Code Status, Allergies Code Status Info: DNR Allergies Info: bee venom, NSAIDS, tape(adhesive), codeine, penicillins, sulfa antibiotics           Current Medications (12/24/2023):  This is the current hospital active medication list Current Facility-Administered Medications  Medication Dose Route Frequency Provider Last Rate Last Admin   acetaminophen  (TYLENOL ) tablet 650 mg  650 mg Oral Q6H PRN Doutova, Anastassia, MD       Or   acetaminophen  (TYLENOL ) suppository 650 mg  650 mg Rectal Q6H PRN Doutova, Anastassia, MD       albuterol  (PROVENTIL ) (2.5 MG/3ML) 0.083% nebulizer solution 2.5 mg  2.5 mg Nebulization Q2H PRN Doutova, Anastassia, MD       amantadine  (SYMMETREL ) capsule 100 mg  100 mg Oral BID Doutova, Anastassia, MD   100 mg at 12/24/23 0914   bisacodyl  (DULCOLAX) suppository 10 mg  10 mg Rectal Daily Kathrin Mignon DASEN, MD  Followed by   NOREEN ON 12/26/2023] bisacodyl  (DULCOLAX) suppository 10 mg  10 mg Rectal Daily PRN Gonfa, Taye T, MD       cefTRIAXone  (ROCEPHIN ) 2 g in sodium chloride  0.9 % 100 mL IVPB  2 g Intravenous Q24H Doutova, Anastassia, MD 200 mL/hr at 12/24/23 0903 2 g at 12/24/23 9096   cycloSPORINE  (RESTASIS ) 0.05 % ophthalmic emulsion 1 drop  1 drop Both Eyes BID Doutova, Anastassia, MD   1 drop at 12/24/23 9096   docusate sodium  (COLACE) capsule 100 mg  100 mg Oral BID Doutova, Anastassia, MD   100 mg at 12/24/23 9096   fluorometholone  (FML)  0.1 % ophthalmic suspension 1 drop  1 drop Both Eyes BID Doutova, Anastassia, MD   1 drop at 12/24/23 0904   HYDROmorphone  (DILAUDID ) injection 0.5-1 mg  0.5-1 mg Intravenous Q2H PRN Doutova, Anastassia, MD       LORazepam  (ATIVAN ) tablet 0.5 mg  0.5 mg Oral Daily Doutova, Anastassia, MD   0.5 mg at 12/24/23 1046   methocarbamol  (ROBAXIN ) injection 500 mg  500 mg Intravenous Q6H PRN Doutova, Anastassia, MD       metroNIDAZOLE  (FLAGYL ) IVPB 500 mg  500 mg Intravenous Q12H Doutova, Anastassia, MD 100 mL/hr at 12/24/23 1045 500 mg at 12/24/23 1045   milk and molasses enema  1 enema Rectal Once PRN Doutova, Anastassia, MD       mirabegron  ER (MYRBETRIQ ) tablet 25 mg  25 mg Oral Daily Doutova, Anastassia, MD   25 mg at 12/24/23 0914   ondansetron  (ZOFRAN ) tablet 4 mg  4 mg Oral Q6H PRN Doutova, Anastassia, MD       Or   ondansetron  (ZOFRAN ) injection 4 mg  4 mg Intravenous Q6H PRN Doutova, Anastassia, MD       pantoprazole  (PROTONIX ) EC tablet 40 mg  40 mg Oral Daily Doutova, Anastassia, MD   40 mg at 12/24/23 0903   polyethylene glycol (MIRALAX  / GLYCOLAX ) packet 17 g  17 g Oral BID Gonfa, Taye T, MD       Followed by   [START ON 12/25/2023] polyethylene glycol (MIRALAX  / GLYCOLAX ) packet 17 g  17 g Oral BID PRN Gonfa, Taye T, MD       risperiDONE  (RISPERDAL ) tablet 1 mg  1 mg Oral Daily Doutova, Anastassia, MD   1 mg at 12/24/23 0903   senna (SENOKOT) tablet 8.6 mg  1 tablet Oral BID Doutova, Anastassia, MD   8.6 mg at 12/24/23 0903   vancomycin  (VANCOREADY) IVPB 750 mg/150 mL  750 mg Intravenous Q24H Doutova, Anastassia, MD         Discharge Medications: Please see discharge summary for a list of discharge medications.  Relevant Imaging Results:  Relevant Lab Results:   Additional Information SS # [id number] [facility], LCSWA

## 2023-12-24 NOTE — Progress Notes (Signed)
 [facility] Liaison Note  Received request from [facility] for hospice services at home after discharge. Spoke with patient's husband Ezzard to initiate education related to hospice philosophy, services, and team approach to care. It was unclear as to whether he understood the information. Explained to him that [facility] will reach out to him when his wife is discharged to start home hospice services if he is in agreement with that. He stated that he was.  DME needs discussed. No DME needs at this time.  Please send signed and completed DNR home with patient.  Please provide prescriptions at discharge as needed to ensure ongoing symptom management.   [facility] information and contact numbers given to Mr. Rogel. Above information shared with [facility] Care [facility]. Please call with any questions or concerns.   Thank you for the opportunity to participate in this patient's care.   Amy Darien  BSN, RN [facility] Liaison [phone]

## 2023-12-25 ENCOUNTER — Inpatient Hospital Stay (HOSPITAL_COMMUNITY)

## 2023-12-25 DIAGNOSIS — Z4682 Encounter for fitting and adjustment of non-vascular catheter: Secondary | ICD-10-CM | POA: Diagnosis not present

## 2023-12-25 DIAGNOSIS — G10 Huntington's disease: Secondary | ICD-10-CM | POA: Diagnosis not present

## 2023-12-25 DIAGNOSIS — C801 Malignant (primary) neoplasm, unspecified: Secondary | ICD-10-CM | POA: Diagnosis not present

## 2023-12-25 DIAGNOSIS — R6520 Severe sepsis without septic shock: Secondary | ICD-10-CM | POA: Diagnosis not present

## 2023-12-25 DIAGNOSIS — K5900 Constipation, unspecified: Secondary | ICD-10-CM | POA: Diagnosis not present

## 2023-12-25 DIAGNOSIS — A419 Sepsis, unspecified organism: Secondary | ICD-10-CM | POA: Diagnosis not present

## 2023-12-25 DIAGNOSIS — K8689 Other specified diseases of pancreas: Secondary | ICD-10-CM | POA: Diagnosis not present

## 2023-12-25 DIAGNOSIS — Z515 Encounter for palliative care: Secondary | ICD-10-CM | POA: Diagnosis not present

## 2023-12-25 DIAGNOSIS — Z96642 Presence of left artificial hip joint: Secondary | ICD-10-CM | POA: Diagnosis not present

## 2023-12-25 DIAGNOSIS — S88111S Complete traumatic amputation at level between knee and ankle, right lower leg, sequela: Secondary | ICD-10-CM | POA: Diagnosis not present

## 2023-12-25 DIAGNOSIS — R140 Abdominal distension (gaseous): Secondary | ICD-10-CM | POA: Diagnosis not present

## 2023-12-25 LAB — CBC
HCT: 32 % — ABNORMAL LOW (ref 36.0–46.0)
Hemoglobin: 10.8 g/dL — ABNORMAL LOW (ref 12.0–15.0)
MCH: 29.7 pg (ref 26.0–34.0)
MCHC: 33.8 g/dL (ref 30.0–36.0)
MCV: 87.9 fL (ref 80.0–100.0)
Platelets: 372 K/uL (ref 150–400)
RBC: 3.64 MIL/uL — ABNORMAL LOW (ref 3.87–5.11)
RDW: 17.3 % — ABNORMAL HIGH (ref 11.5–15.5)
WBC: 10.6 K/uL — ABNORMAL HIGH (ref 4.0–10.5)
nRBC: 0 % (ref 0.0–0.2)

## 2023-12-25 LAB — COMPREHENSIVE METABOLIC PANEL WITH GFR
ALT: 114 U/L — ABNORMAL HIGH (ref 0–44)
AST: 66 U/L — ABNORMAL HIGH (ref 15–41)
Albumin: 2.2 g/dL — ABNORMAL LOW (ref 3.5–5.0)
Alkaline Phosphatase: 701 U/L — ABNORMAL HIGH (ref 38–126)
Anion gap: 11 (ref 5–15)
BUN: 7 mg/dL — ABNORMAL LOW (ref 8–23)
CO2: 22 mmol/L (ref 22–32)
Calcium: 8.4 mg/dL — ABNORMAL LOW (ref 8.9–10.3)
Chloride: 102 mmol/L (ref 98–111)
Creatinine, Ser: 0.48 mg/dL (ref 0.44–1.00)
GFR, Estimated: >60 mL/min (ref >60)
Glucose, Bld: 111 mg/dL — ABNORMAL HIGH (ref 70–99)
Potassium: 3.5 mmol/L (ref 3.5–5.1)
Sodium: 135 mmol/L (ref 135–145)
Total Bilirubin: 5.1 mg/dL — ABNORMAL HIGH (ref 0.0–1.2)
Total Protein: 4.8 g/dL — ABNORMAL LOW (ref 6.5–8.1)

## 2023-12-25 LAB — MAGNESIUM: Magnesium: 1.4 mg/dL — ABNORMAL LOW (ref 1.7–2.4)

## 2023-12-25 LAB — PHOSPHORUS: Phosphorus: 2.8 mg/dL (ref 2.5–4.6)

## 2023-12-25 LAB — LIPASE, BLOOD: Lipase: 35 U/L (ref 11–51)

## 2023-12-25 MED ORDER — POTASSIUM CHLORIDE CRYS ER 20 MEQ PO TBCR: 20.0000 meq | EXTENDED_RELEASE_TABLET | Freq: Every day | ORAL | Status: AC

## 2023-12-25 MED ORDER — MAGNESIUM OXIDE -MG SUPPLEMENT 400 (240 MG) MG PO TABS: 400.0000 mg | ORAL_TABLET | Freq: Every day | ORAL | Status: AC

## 2023-12-25 MED ORDER — POLYETHYLENE GLYCOL 3350 17 GM/SCOOP PO POWD: 17.0000 g | Freq: Two times a day (BID) | ORAL | Status: DC | PRN

## 2023-12-25 MED ORDER — MAGNESIUM OXIDE -MG SUPPLEMENT 400 (240 MG) MG PO TABS
400.0000 mg | ORAL_TABLET | Freq: Every day | ORAL | Status: DC
Start: 2023-12-25 — End: 2023-12-25
  Administered 2023-12-25: 400 mg via ORAL
  Filled 2023-12-25: qty 1

## 2023-12-25 MED ORDER — LEVOFLOXACIN 750 MG PO TABS: 750.0000 mg | ORAL_TABLET | Freq: Every day | ORAL | Status: AC

## 2023-12-25 MED ORDER — LORAZEPAM 2 MG/ML IJ SOLN: 1.0000 mg | INTRAMUSCULAR | Status: DC

## 2023-12-25 MED ORDER — POTASSIUM CHLORIDE CRYS ER 20 MEQ PO TBCR
40.0000 meq | EXTENDED_RELEASE_TABLET | Freq: Once | ORAL | Status: AC
  Administered 2023-12-25: 40 meq via ORAL
  Filled 2023-12-25: qty 2

## 2023-12-25 MED ORDER — ZIPRASIDONE MESYLATE 20 MG IM SOLR
10.0000 mg | Freq: Once | INTRAMUSCULAR | Status: DC
Start: 2023-12-25 — End: 2023-12-25

## 2023-12-25 MED ORDER — MAGNESIUM SULFATE 4 GM/100ML IV SOLN
4.0000 g | Freq: Once | INTRAVENOUS | Status: DC
  Filled 2023-12-25: qty 100

## 2023-12-25 MED ORDER — ENSURE PLUS HIGH PROTEIN PO LIQD
237.0000 mL | Freq: Two times a day (BID) | ORAL | Status: DC
  Administered 2023-12-25: 237 mL via ORAL

## 2023-12-25 MED ORDER — LEVOFLOXACIN 750 MG PO TABS
750.0000 mg | ORAL_TABLET | Freq: Every day | ORAL | Status: DC
  Administered 2023-12-25: 750 mg via ORAL
  Filled 2023-12-25: qty 1

## 2023-12-25 MED ORDER — METRONIDAZOLE 500 MG PO TABS: 500.0000 mg | ORAL_TABLET | Freq: Two times a day (BID) | ORAL | Status: AC

## 2023-12-25 MED ORDER — HALOPERIDOL LACTATE 5 MG/ML IJ SOLN: 1.0000 mg | INTRAMUSCULAR | Status: DC

## 2023-12-25 MED ORDER — MAGNESIUM 250 MG PO TABS: 250.0000 mg | ORAL_TABLET | Freq: Two times a day (BID) | ORAL | Status: DC

## 2023-12-25 MED ORDER — SENNOSIDES-DOCUSATE SODIUM 8.6-50 MG PO TABS: 1.0000 | ORAL_TABLET | Freq: Two times a day (BID) | ORAL | Status: DC | PRN

## 2023-12-25 NOTE — Plan of Care (Addendum)
@   05:00 am - patient pulled out her IV and took off her tele, was very agitated with the RN for allowing the phlebotomist to draw labs. Patient began yelling at the RN and moved her arm to motion for RN to go away when RN tried to check IV site and put tele back on. Informed provider. After 40 mins, patient appeared calm. Allowed RN to address the IV and tele. Even smiled at the RN and drank some water.     Problem: Clinical Measurements: Goal: Respiratory complications will improve Outcome: Progressing   Problem: Coping: Goal: Level of anxiety will decrease Outcome: Progressing   Problem: Elimination: Goal: Will not experience complications related to bowel motility Outcome: Progressing Goal: Will not experience complications related to urinary retention Outcome: Progressing   Problem: Pain Managment: Goal: General experience of comfort will improve and/or be controlled Outcome: Progressing   Problem: Education: Goal: Knowledge of General Education information will improve Description: Including pain rating scale, medication(s)/side effects and non-pharmacologic comfort measures Outcome: Not Progressing   Problem: Health Behavior/Discharge Planning: Goal: Ability to manage health-related needs will improve Outcome: Not Progressing   Problem: Activity: Goal: Risk for activity intolerance will decrease Outcome: Not Progressing   Problem: Nutrition: Goal: Adequate nutrition will be maintained Outcome: Not Progressing

## 2023-12-25 NOTE — Discharge Summary (Addendum)
 Physician Discharge Summary  Natasha Frederick FMW:[id number] DOB: 04/23/1955 DOA: 12/23/2023  PCP: Micheal Wolm ORN, MD  Admit date: 12/23/2023 Discharge date: 12/25/23  Admitted From: SNF Disposition:  SNF Recommendations for Outpatient Follow-up:  Recommend palliative follow-up at SNF   Discharge Condition: Stable CODE STATUS: DNR Diet Orders (From admission, onward)     Start     Ordered   12/23/23 2339  Diet regular Room service appropriate? Yes; Fluid consistency: Thin  Diet effective now       Question Answer Comment  Room service appropriate? Yes   Fluid consistency: Thin      12/23/23 2338              Hospital course 67 year old F with PMH of Huntington's disease, right BKA, DVT, PUD, endometriosis, dysphagia, vitamin D deficiency, constipation, prior perforated gastric ulcer in 2020, GERD, pancreatic mass and CBD stricture s/p ERCP and stenting presenting with nausea, vomiting, abdominal pain, increased weakness and fever.  Increased weakness for about 2 weeks.  Febrile to 101.1.   Patient was hospitalized from 9/8-9/12 with obstructive jaundice for which she underwent ERCP, placement of plastic stent and biopsy, and discharged to SNF with palliative follow-up for comfort care.  Pathology from prior ERCP suggested adenocarcinoma likely of pancreatic origin.  Per GI note, family not interested in pursuing oncology evaluation or treatment.   In ED, febrile to 101.1.  WBC 18.8 with left shift.  LFT, bilirubin and ALP increased from prior values.  CT abdomen and pelvis with severe unchanged intrahepatic and extrahepatic biliary dilation, severe pancreatic duct dilation with transition point near the pancreatic head and neck, poorly defined hypodense lesion at the pancreatic head and neck region measuring up to 1.4 cm suggesting pancreatic neoplasm and marked narrowing of proximal splenic vein and portal venous confluence suggesting for extrinsic compression or tumor  involvement, rectal fecal impaction and other nonspecific finding.     Cultures obtained.  Patient was started on broad-spectrum antibiotics and admitted. Patient's blood culture negative.  Constipation is resolved with scheduled bowel regimen.  Antibiotic de-escalated she is discharged on Levaquin  and Flagyl  for 7 more days to complete a total of 10 days course.  Palliative follow-up at SNF.  See individual problem list below for more.   Problems addressed during this hospitalization Severe sepsis due to possible intra-abdominal infection: Present on admission.  Has a leukocytosis, tachypnea and lactic acidosis on presentation.  Blood cultures NGTD.  Suspect intra-abdominal infection.  Sepsis physiology resolved. -Received ceftriaxone  and Flagyl  for 3 days and discharged on p.o. Levaquin  and Flagyl  for 7 more days   Adenocarcinoma of pancreas Obstructive jaundice s/p biliary stent insertion on 9/9 Elevated liver enzymes/hyperbilirubinemia: Improved. -Family, patient's husband not interested in pursuing oncologic evaluation or treatment -Emphasis on comfort care.   Huntington disease [facility]): Chronic and stable. -Continue home meds and supportive measures -Continue Risperdal  and amantadine .   Obstipation-has had a small bowel movement this morning.  Resolved.  Had multiple bowel movements after bowel regimen.  KUB without significant stool burden. - Continue MiraLAX  and senna as needed   Prolonged QT interval: Exaggerated reading on initial EKG.  Subsequent EKGs with normal QT.  Advance care planning: Patient is DNR comfort.  Palliative to follow-up at SNF.  Hypokalemia/hypomagnesemia:  - P.o. magnesium  400 mg daily for 10 days. - P.o. KCl 20 mEq daily for 10 days.  Right BKA-noted      There is no height or weight on file to calculate BMI.  Consultations: None  Time spent 35  minutes  Vital signs Vitals:   12/24/23 0448 12/25/23 0002 12/25/23 0411 12/25/23  0813  BP:  126/79 125/67 119/86  Pulse:  70 79 (!) 59  Temp:  98.3 F (36.8 C) 98.1 F (36.7 C) 98.1 F (36.7 C)  Resp: 16   14  SpO2:  96% 99% 95%  TempSrc:         Discharge exam  GENERAL: No apparent distress.  Nontoxic. HEENT: MMM.  Vision and hearing grossly intact.  NECK: Supple.  No apparent JVD.  RESP:  No IWOB.  Fair aeration bilaterally. CVS: Regular rhythm.  Normal rate.  Heart sounds normal.  ABD/GI/GU: BS+. Abd soft, NTND.  MSK/EXT:  Moves extremities.  Right BKA.  Some deformity in her hands, right> left. SKIN: no apparent skin lesion or wound NEURO: AA.  Oriented to self and place but not time.  Slow and fragmented speech.  Poor muscle coordination. PSYCH: Calm. Normal affect.   Discharge Instructions Discharge Instructions     Increase activity slowly   Complete by: As directed       Allergies as of 12/25/2023       Reactions   Bee Venom Anaphylaxis, Swelling   Nsaids Other (See Comments)   PERFORATED ULCER = NO MORE NSAIDs   Adhesive [tape] Other (See Comments)   redness   Codeine Nausea And Vomiting   Penicillins    Did it involve swelling of the face/tongue/throat, SOB, or low BP? Yes Did it involve sudden or severe rash/hives, skin peeling, or any reaction on the inside of your mouth or nose? Unknown Did you need to seek medical attention at a hospital or doctor's office? Yes When did it last happen?   age 53-40    If all above answers are "NO", may proceed with cephalosporin use.   Sulfa Antibiotics Itching        Medication List     TAKE these medications    acetaminophen  500 MG tablet Commonly known as: TYLENOL  Take 1,000 mg by mouth 2 (two) times daily as needed for mild pain (pain score 1-3) or moderate pain (pain score 4-6).   amantadine  100 MG capsule Commonly known as: SYMMETREL  Take 100 mg by mouth 2 (two) times daily.   fluorometholone  0.1 % ophthalmic suspension Commonly known as: FML Place 1 drop into both eyes 2 (two)  times daily.   Gemtesa  75 MG Tabs Generic drug: Vibegron  Take 1 tablet by mouth daily What changed:  how much to take how to take this when to take this   levofloxacin  750 MG tablet Commonly known as: LEVAQUIN  Take 1 tablet (750 mg total) by mouth daily for 7 days. Start taking on: December 26, 2023   LORazepam  0.5 MG tablet Commonly known as: ATIVAN  Take 1-2 tablets (0.5-1 mg total) by mouth daily. May take additional dose if needed for anxiety What changed: how much to take   magnesium  oxide 400 (240 Mg) MG tablet Commonly known as: MAG-OX Take 1 tablet (400 mg total) by mouth daily for 10 days.   metroNIDAZOLE  500 MG tablet Commonly known as: FLAGYL  Take 1 tablet (500 mg total) by mouth 2 (two) times daily for 7 days.   nystatin  cream Commonly known as: MYCOSTATIN  apply to affected rash twice daily as needed   pantoprazole  40 MG tablet Commonly known as: PROTONIX  Take 1 tablet (40 mg total) by mouth daily.   polyethylene glycol powder 17 GM/SCOOP powder Commonly known as:  MiraLax  Take 17 g by mouth 2 (two) times daily as needed for moderate constipation.   potassium chloride  SA 20 MEQ tablet Commonly known as: KLOR-CON  M Take 1 tablet (20 mEq total) by mouth daily for 10 days.   Restasis  0.05 % ophthalmic emulsion Generic drug: cycloSPORINE  Place 1 drop into both eyes 2 (two) times daily.   risperiDONE  1 MG tablet Commonly known as: RISPERDAL  Take 1 mg by mouth daily.   senna-docusate 8.6-50 MG tablet Commonly known as: Senokot-S Take 1 tablet by mouth 2 (two) times daily between meals as needed for mild constipation.   sertraline  100 MG tablet Commonly known as: ZOLOFT  TAKE 1 TABLET(100 MG) BY MOUTH DAILY What changed: See the new instructions.         Procedures/Studies:   DG Abd Portable 1V Result Date: 12/25/2023 CLINICAL DATA:  Constipation. EXAM: PORTABLE ABDOMEN - 1 VIEW COMPARISON:  07/31/2018 FINDINGS: Mild to moderate gaseous  distention of the stomach. Gas-filled bowel loops in the abdomen and pelvis evident in a nonobstructive pattern. Biliary stent device overlies the medial right abdomen. Convex leftward lumbar scoliosis evident status post lumbar fusion. Bones are demineralized. Left hip replacement has been incompletely visualized. Degenerative changes noted right hip. IMPRESSION: 1. Mild to moderate gaseous distention of the stomach. 2. Nonobstructive bowel gas pattern.  No substantial stool volume. Electronically Signed   By: Camellia Candle M.D.   On: 12/25/2023 10:25   CT ABDOMEN PELVIS W CONTRAST Result Date: 12/23/2023 CLINICAL DATA:  Abdominal pain, acute, nonlocalized. EXAM: CT ABDOMEN AND PELVIS WITH CONTRAST TECHNIQUE: Multidetector CT imaging of the abdomen and pelvis was performed using the standard protocol following bolus administration of intravenous contrast. RADIATION DOSE REDUCTION: This exam was performed according to the departmental dose-optimization program which includes automated exposure control, adjustment of the mA and/or kV according to patient size and/or use of iterative reconstruction technique. CONTRAST:  75mL OMNIPAQUE  IOHEXOL  350 MG/ML SOLN COMPARISON:  12/16/2023 FINDINGS: Lower chest: Again noted is a 4 mm nodule in the right lower lobe on image 2/5. At least 2 additional small nodules in the right middle lobe on image 4/5. No pleural effusions. Hepatobiliary: Severe intrahepatic and extrahepatic biliary dilatation has not changed. Interval placement of a plastic biliary stent that extends into the right intrahepatic bile ducts. Distal aspect of the stent is near the ampulla. Gallbladder remains moderately distended. Limited evaluation for intrahepatic lesions due to the severe biliary distension. Mild stranding around the gallbladder is similar to the previous examination. Pancreas: Again noted is severe pancreatic duct dilatation with the transition point near the pancreatic neck and head.  Poorly defined hypodense lesion at the pancreatic head-neck region is again noted measuring 1.4 x 1.2 cm on image 31/3. Limited evaluation of the pancreatic head due to artifact from the spinal hardware. Stranding and inflammatory changes around the pancreatic head. Spleen: Spleen is small with a nodular configuration. Some irregular hypodensities along the left superior aspect of the spleen are similar to a chest CT from 07/14/2021. Adrenals/Urinary Tract: Normal appearance of the adrenal glands. Normal appearance of both kidneys without hydronephrosis. Normal appearance of the urinary bladder but limited evaluation due to artifact from the left hip arthroplasty. Stomach/Bowel: Again noted is severe distension of the rectum with a large amount of stool. Findings are suggestive for rectal impaction. Diverticula involving the sigmoid colon. Mild dilatation of the stomach. Vascular/Lymphatic: Atherosclerotic disease in the abdominal aorta without aneurysm. Indeterminate densities / soft tissue around the SMA on image 29/3.  Mildly prominent lymph nodes in the porta hepatis and precaval region. Main portal vein is patent but there is marked narrowing and attenuation of the proximal splenic vein and the portal venous confluence on image 29/3. Findings are similar to the previous examination and concerning for tumor involvement or extrinsic compression. Reproductive: Limited evaluation due to the rectal distension and left hip arthroplasty. No evidence for an adnexal mass. Other: Again noted is poorly defined low-density in the soft tissue around the right ischial tuberosity on image 82/3. This area has been previously biopsied. No significant bone destruction in the adjacent right ischial tuberosity. This low-density area measures 2.3 x 2.9 cm. Negative for ascites. Negative for free air. Musculoskeletal: Left hip arthroplasty is located. Levoscoliosis in lumbar spine. Posterior lumbar interbody fusion with bilateral  pedicle screws and rods at L2, L3 and L4. IMPRESSION: 1. Interval placement of a plastic biliary stent. Severe intrahepatic and extrahepatic biliary dilatation has not significantly changed. 2. Severe pancreatic duct dilatation with transition point near the pancreatic head and neck. There is a poorly defined hypodense lesion at the pancreatic head-neck region measuring up to 1.4 cm. Findings are suggestive for a pancreatic neoplasm. 3. Marked narrowing of the proximal splenic vein and portal venous confluence. Findings are similar to the previous examination and concerning for tumor involvement or extrinsic compression. 4. Severe distension of the rectum with a large amount of stool. Findings are suggestive for rectal stool impaction. 5. Indeterminate soft tissue around the SMA. Mildly prominent lymph nodes in the porta hepatis and precaval region. Findings are concerning for neoplastic disease. 6. Indeterminate low-density in the soft tissue around the right ischial tuberosity. This area has been previously biopsied. No significant bone destruction in the adjacent right ischial tuberosity. 7. Small nodules in the right middle lobe and right lower lobe. These nodules are indeterminate but metastatic disease cannot be excluded. 8. Aortic Atherosclerosis (ICD10-I70.0). Electronically Signed   By: Juliene Balder M.D.   On: 12/23/2023 17:20   CT Cervical Spine Wo Contrast Result Date: 12/23/2023 EXAM: CT CERVICAL SPINE WITHOUT CONTRAST 12/23/2023 04:42:18 PM TECHNIQUE: CT of the cervical spine was performed without the administration of intravenous contrast. Multiplanar reformatted images are provided for review. Automated exposure control, iterative reconstruction, and/or weight based adjustment of the mA/kV was utilized to reduce the radiation dose to as low as reasonably achievable. COMPARISON: 04/01/2023 CLINICAL HISTORY: Neck trauma (Age >= 65y). FINDINGS: CERVICAL SPINE: BONES AND ALIGNMENT: Similar alignment with  reversal of the normal cervical lordosis. No facet dislocation. Dextrocurvature of the cervical spine. Similar clipal file type deformity of the cervical spine with fusion of the C3 through C6 vertebral bodies. There is also likely partial fusion of C6 and C7 vertebral bodies. Diffuse osteopenia. DEGENERATIVE CHANGES: Degenerative endplate changes greatest at C5-6. SOFT TISSUES: No prevertebral soft tissue swelling. IMPRESSION: 1. No acute abnormality of the cervical spine related to the reported neck trauma. 2. 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 Electronically signed by: Donnice Mania MD 12/23/2023 05:08 PM EDT RP Workstation: [id number]   CT Head Wo Contrast Result Date: 12/23/2023 EXAM: CT HEAD WITHOUT CONTRAST 12/23/2023 04:42:18 PM TECHNIQUE: CT of the head was performed without the administration of intravenous contrast. Automated exposure control, iterative reconstruction, and/or weight based adjustment of the mA/kV was utilized to reduce the radiation dose to as low as reasonably achievable. COMPARISON: CT head 12/16/2023 and 04/01/2023. CLINICAL HISTORY: Head  trauma, minor (Age >= 65y). FINDINGS: BRAIN AND VENTRICLES: No acute hemorrhage. No evidence of acute infarct. Ventriculomegaly is similar to prior, likely related to volume loss. Similar encephalomalacia in the anterior inferior frontal lobes, likely in the setting of prior trauma. Additional encephalomalacia in the left cerebellum. Nonspecific hypoattenuation in the periventricular and subcortical white matter, most likely representing chronic microvascular ischemic changes. ORBITS: No acute abnormality. SINUSES: No acute abnormality. SOFT TISSUES AND SKULL: No acute soft tissue abnormality. No skull fracture. Mild left mastoid effusion. IMPRESSION: 1. No acute intracranial abnormality. 2. Moderate parenchymal volume loss. 3. Encephalomalacia in the  anterior inferior frontal lobes, likely in the setting of prior trauma, and in the left cerebellum. Electronically signed by: Donnice Mania MD 12/23/2023 05:00 PM EDT RP Workstation: [id number]   DG ERCP Result Date: 12/23/2023 CLINICAL DATA:  Jaundice with history pancreatic mass and common bile duct dilatation. EXAM: ERCP 9 intra procedural fluoroscopic images of the right upper quadrant TECHNIQUE: Multiple spot images obtained with the fluoroscopic device and submitted for interpretation post-procedure. FLUOROSCOPY: Radiation Exposure Index (as provided by the fluoroscopic device): 47.559 mGy Kerma COMPARISON:  None Available. FINDINGS: Nine images demonstrate flexible endoscopy with guidewire in the common bile duct. Diffuse dilatation the bilateral intrahepatic ducts. No definite extravasation of contrast. Interval placement of a plastic stent. Post laminectomy hardware in the lumbar spine 3 low IMPRESSION: Interval placement of a plastic common bile duct stent during ERCP. These images were submitted for radiologic interpretation only. Please see the procedural report for the amount of contrast and the fluoroscopy time utilized. Electronically Signed   By: Cordella Banner   On: 12/23/2023 16:35   DG Chest Portable 1 View Result Date: 12/23/2023 CLINICAL DATA:  Short of breath EXAM: PORTABLE CHEST 1 VIEW COMPARISON:  None Available. FINDINGS: Normal mediastinum and cardiac silhouette. Normal pulmonary vasculature. No evidence of effusion, infiltrate, or pneumothorax. No acute bony abnormality. Hand obscures a portion of the RIGHT thorax. IMPRESSION: No acute cardiopulmonary process. Electronically Signed   By: Jackquline Boxer M.D.   On: 12/23/2023 14:12   CT GUIDED NEEDLE PLACEMENT Result Date: 12/18/2023 CLINICAL DATA:  peripherally enhancing fluid collection adjacent to the right ischial tuberosity, measuring 2.9 x 2.5 cm on image 84/5. Based on location, this is suspicious for an abscess related to  an overlying decubitus ulcer, although no obvious skin defect is seen. No associated cortical destruction to suggest osteomyelitis. EXAM: CT GUIDED CORE BIOPSY OF RIGHT PELVIC MASS ANESTHESIA/SEDATION: lidocaine  1% subcutaneous PROCEDURE: The procedure risks, benefits, and alternatives were explained to the patient. Questions regarding the procedure were encouraged and answered. The patient understands and consents to the procedure. Patient placed prone. Select axial scans of the pelvis were obtained. Soft tissue process inferior to the right ischial tuberosity was localized. The operative field was prepped with chlorhexidinein a sterile fashion, and a sterile drape was applied covering the operative field. A sterile gown and sterile gloves were used for the procedure. Local anesthesia was provided with 1% Lidocaine . Under CT fluoroscopic guidance, 18 gauge needle advanced into the lesion. No fluid could be aspirated. Subsequently, a 17 gauge trocar needle was advanced to the margin of the lesion. Coaxial 18 gauge core biopsy samples were obtained, submitted in saline to surgical pathology. Postprocedure scans show no hemorrhage or other apparent complication. The patient tolerated the procedure well. RADIATION DOSE REDUCTION: This exam was performed according to the departmental dose-optimization program which includes automated exposure control, adjustment of the mA and/or kV according  to patient size and/or use of iterative reconstruction technique. COMPLICATIONS: None immediate FINDINGS: 4.1 cm soft tissue attenuation process inferior to the right the ischial tuberosity was localized corresponding to previous imaging. No fluid could be aspirated to suggest abscess. Core biopsy samples of the lesion were obtained as above. IMPRESSION: 1. Technically successful CT-guided core biopsy, right pelvic mass. Electronically Signed   By: JONETTA Faes M.D.   On: 12/18/2023 16:49   CT ABDOMEN PELVIS W CONTRAST Addendum  Date: 12/16/2023 ADDENDUM REPORT: 12/16/2023 13:17 ADDENDUM: Not mentioned in the original report is a peripherally enhancing fluid collection adjacent to the right ischial tuberosity, measuring 2.9 x 2.5 cm on image 84/5. Based on location, this is suspicious for an abscess related to an overlying decubitus ulcer, although no obvious skin defect is seen. No associated cortical destruction to suggest osteomyelitis. Electronically Signed   By: Elsie Perone M.D.   On: 12/16/2023 13:17   Result Date: 12/16/2023 CLINICAL DATA:  Abdominal pain, acute, nonlocalized abd pain, weakness, new jaundice. * Tracking Code: BO * EXAM: CT ABDOMEN AND PELVIS WITH CONTRAST TECHNIQUE: Multidetector CT imaging of the abdomen and pelvis was performed using the standard protocol following bolus administration of intravenous contrast. RADIATION DOSE REDUCTION: This exam was performed according to the departmental dose-optimization program which includes automated exposure control, adjustment of the mA and/or kV according to patient size and/or use of iterative reconstruction technique. CONTRAST:  75mL OMNIPAQUE  IOHEXOL  350 MG/ML SOLN COMPARISON:  Abdominopelvic CT 07/30/2018.  Chest CT 07/14/2021. FINDINGS: Technical note: Despite efforts by the technologist and patient, mild motion artifact is present on today's exam and could not be eliminated. This reduces exam sensitivity and specificity. Lower chest: 5 mm subpleural right lower lobe nodule on image 3/7 appears new from previous chest CT. The lung bases are otherwise clear. No significant pleural or pericardial effusion. Hepatobiliary: The liver has a non cirrhotic morphology without focal parenchymal abnormality. However, there is new severe diffuse intrahepatic biliary dilatation associated with moderate distention of the gallbladder. The common hepatic duct is dilated to 2.3 cm. There is focal narrowing of the common bile duct above the pancreatic head with suspected ductal wall  enhancement. Pancreas: New moderate dilatation of the pancreatic duct within the pancreatic tail. There is an ill-defined low-density mass within the pancreatic neck, measuring 1.6 x 1.3 cm on image 28/5. Findings are highly concerning for pancreatic malignancy with associated double duct sign. Spleen: New contour irregularity and nodularity of the spleen without focally suspicious parenchymal lesion. Adrenals/Urinary Tract: Both adrenal glands appear normal. No evidence of urinary tract calculus, suspicious renal lesion or hydronephrosis. The bladder appears unremarkable for its degree of distention. Stomach/Bowel: No enteric contrast administered. The stomach appears unremarkable for its degree of distension. No evidence of bowel wall thickening, distention or surrounding inflammatory change. There is a large amount of stool within the rectum. Vascular/Lymphatic: No enlarged abdominopelvic lymph nodes are identified. There is aortic and branch vessel atherosclerosis without evidence of aneurysm or large vessel occlusion. New extrinsic venous narrowing at the portal vein/SMV confluence without evidence of acute thrombosis. Reproductive: The uterus and ovaries appear unremarkable. No evidence of adnexal mass. Tubal ligation clips noted on the right. Other: Previously demonstrated ascites has resolved. No peritoneal nodularity or pneumoperitoneum identified. Musculoskeletal: No acute or significant osseous findings. Status post left total hip arthroplasty and 2 level lumbar fusion. There is a convex left thoracolumbar scoliosis with multilevel spondylosis. Moderate right hip degenerative changes are noted. Old fractures of the  right pubic rami. IMPRESSION: 1. New severe intrahepatic and extrahepatic biliary dilatation with moderate pancreatic ductal dilatation an ill-defined low-density mass in the pancreatic neck, highly concerning for pancreatic malignancy. 2. New extrinsic venous narrowing at the portal vein/SMV  confluence without evidence of acute thrombosis, worrisome for tumor unresectability. 3. New contour irregularity and nodularity of the spleen without focally suspicious parenchymal lesion. 4. New 5 mm subpleural right lower lobe pulmonary nodule, nonspecific, potentially a metastasis. Consider further evaluation full chest CT. No evidence of distant metastatic disease in the abdomen or pelvis. 5.  Aortic Atherosclerosis (ICD10-I70.0). Electronically Signed: By: Elsie Perone M.D. On: 12/16/2023 10:26   CT Head Wo Contrast Result Date: 12/16/2023 EXAM: CT HEAD WITHOUT CONTRAST 12/16/2023 09:57:34 AM TECHNIQUE: CT of the head was performed without the administration of intravenous contrast. Automated exposure control, iterative reconstruction, and/or weight based adjustment of the mA/kV was utilized to reduce the radiation dose to as low as reasonably achievable. COMPARISON: 03/31/2001 CLINICAL HISTORY: Delirium. FINDINGS: BRAIN AND VENTRICLES: Moderate generalized cerebral and cerebellar volume loss. Chronic encephalomalacic changes within the frontal lobes bilaterally and the left cerebellar hemisphere. Mild-to-moderate periventricular white matter disease. No acute hemorrhage. No evidence of acute infarct. No hydrocephalus. No extra-axial collection. No mass effect or midline shift. ORBITS: No acute abnormality. SINUSES: No acute abnormality. SOFT TISSUES AND SKULL: No acute soft tissue abnormality. No skull fracture. Calcifications within the carotid siphons. IMPRESSION: 1. No acute intracranial abnormality. 2. Chronic encephalomalacic changes within the frontal lobes bilaterally and the left cerebellar hemisphere. 3. Mild-to-moderate periventricular white matter disease. Electronically signed by: Evalene Coho MD 12/16/2023 10:09 AM EDT RP Workstation: [id number]   DG Chest Portable 1 View Result Date: 12/16/2023 CLINICAL DATA:  Cough. EXAM: PORTABLE CHEST 1 VIEW COMPARISON:  07/03/2021. FINDINGS:  Bilateral lung fields are clear. Linear calcifications noted along the left diaphragmatic pleura, nonspecific but commonly seen with asbestos related disease. Bilateral costophrenic angles are clear. No pneumothorax. Stable cardio-mediastinal silhouette. There is unfolding of aortic arch. No acute osseous abnormalities. The soft tissues are within normal limits. IMPRESSION: No active disease. Electronically Signed   By: Ree Molt M.D.   On: 12/16/2023 09:02       The results of significant diagnostics from this hospitalization (including imaging, microbiology, ancillary and laboratory) are listed below for reference.     Microbiology: Recent Results (from the past 240 hours)  Resp panel by RT-PCR (RSV, Flu A&B, Covid) Anterior Nasal Swab     Status: None   Collection Time: 12/16/23  8:42 AM   Specimen: Anterior Nasal Swab  Result Value Ref Range Status   SARS Coronavirus 2 by RT PCR NEGATIVE NEGATIVE Final   Influenza A by PCR NEGATIVE NEGATIVE Final   Influenza B by PCR NEGATIVE NEGATIVE Final    Comment: (NOTE) The Xpert Xpress SARS-CoV-2/FLU/RSV plus assay is intended as an aid in the diagnosis of influenza from Nasopharyngeal swab specimens and should not be used as a sole basis for treatment. Nasal washings and aspirates are unacceptable for Xpert Xpress SARS-CoV-2/FLU/RSV testing.  Fact Sheet for Patients: BloggerCourse.com  Fact Sheet for Healthcare Providers: SeriousBroker.it  This test is not yet approved or cleared by the [place]  FDA and has been authorized for detection and/or diagnosis of SARS-CoV-2 by FDA under an Emergency Use Authorization (EUA). This EUA will remain in effect (meaning this test can be used) for the duration of the COVID-19 declaration under Section 564(b)(1) of the Act, 21 U.S.C. section 360bbb-3(b)(1), unless the authorization  is terminated or revoked.     Resp Syncytial Virus by PCR  NEGATIVE NEGATIVE Final    Comment: (NOTE) Fact Sheet for Patients: BloggerCourse.com  Fact Sheet for Healthcare Providers: SeriousBroker.it  This test is not yet approved or cleared by the [place]  FDA and has been authorized for detection and/or diagnosis of SARS-CoV-2 by FDA under an Emergency Use Authorization (EUA). This EUA will remain in effect (meaning this test can be used) for the duration of the COVID-19 declaration under Section 564(b)(1) of the Act, 21 U.S.C. section 360bbb-3(b)(1), unless the authorization is terminated or revoked.  Performed at [facility], [address], [place], [place] [address]   MRSA Next Gen by PCR, Nasal     Status: None   Collection Time: 12/17/23  9:32 AM   Specimen: Nasal Mucosa; Nasal Swab  Result Value Ref Range Status   MRSA by PCR Next Gen NOT DETECTED NOT DETECTED Final    Comment: (NOTE) The GeneXpert MRSA Assay (FDA approved for NASAL specimens only), is one component of a comprehensive MRSA colonization surveillance program. It is not intended to diagnose MRSA infection nor to guide or monitor treatment for MRSA infections. Test performance is not FDA approved in patients less than 63 years old. Performed at [facility], [address] [place], [place] [address]   Aerobic/Anaerobic Culture w Gram Stain (surgical/deep wound)     Status: None   Collection Time: 12/18/23  3:08 PM   Specimen: Skin, Cyst/Tag/Debridement; Tissue  Result Value Ref Range Status   Specimen Description SKIN  Final   Special Requests CORE RIGHT  Final   Gram Stain NO WBC SEEN NO ORGANISMS SEEN   Final   Culture   Final    No growth aerobically or anaerobically. Performed at [facility], [address], [place], [place] [address]    Report Status 12/24/2023 FINAL  Final  Resp panel by RT-PCR (RSV, Flu A&B, Covid) Anterior Nasal Swab     Status: None   Collection Time:  12/23/23  2:11 PM   Specimen: Anterior Nasal Swab  Result Value Ref Range Status   SARS Coronavirus 2 by RT PCR NEGATIVE NEGATIVE Final   Influenza A by PCR NEGATIVE NEGATIVE Final   Influenza B by PCR NEGATIVE NEGATIVE Final    Comment: (NOTE) The Xpert Xpress SARS-CoV-2/FLU/RSV plus assay is intended as an aid in the diagnosis of influenza from Nasopharyngeal swab specimens and should not be used as a sole basis for treatment. Nasal washings and aspirates are unacceptable for Xpert Xpress SARS-CoV-2/FLU/RSV testing.  Fact Sheet for Patients: BloggerCourse.com  Fact Sheet for Healthcare Providers: SeriousBroker.it  This test is not yet approved or cleared by the [place]  FDA and has been authorized for detection and/or diagnosis of SARS-CoV-2 by FDA under an Emergency Use Authorization (EUA). This EUA will remain in effect (meaning this test can be used) for the duration of the COVID-19 declaration under Section 564(b)(1) of the Act, 21 U.S.C. section 360bbb-3(b)(1), unless the authorization is terminated or revoked.     Resp Syncytial Virus by PCR NEGATIVE NEGATIVE Final    Comment: (NOTE) Fact Sheet for Patients: BloggerCourse.com  Fact Sheet for Healthcare Providers: SeriousBroker.it  This test is not yet approved or cleared by the [place]  FDA and has been authorized for detection and/or diagnosis of SARS-CoV-2 by FDA under an Emergency Use Authorization (EUA). This EUA will remain in effect (meaning this test can be used) for  the duration of the COVID-19 declaration under Section 564(b)(1) of the Act, 21 U.S.C. section 360bbb-3(b)(1), unless the authorization is terminated or revoked.  Performed at [facility], [address], [place], [place] [address]   Blood culture (routine x 2)     Status: None (Preliminary result)   Collection Time: 12/23/23   2:11 PM   Specimen: BLOOD RIGHT ARM  Result Value Ref Range Status   Specimen Description BLOOD RIGHT ARM  Final   Special Requests   Final    BOTTLES DRAWN AEROBIC AND ANAEROBIC Blood Culture results may not be optimal due to an inadequate volume of blood received in culture bottles   Culture   Final    NO GROWTH 2 DAYS Performed at [facility], [address] [place], [place] [address]    Report Status PENDING  Incomplete  Blood culture (routine x 2)     Status: None (Preliminary result)   Collection Time: 12/23/23  5:08 PM   Specimen: BLOOD RIGHT HAND  Result Value Ref Range Status   Specimen Description BLOOD RIGHT HAND  Final   Special Requests   Final    BOTTLES DRAWN AEROBIC ONLY Blood Culture results may not be optimal due to an inadequate volume of blood received in culture bottles   Culture   Final    NO GROWTH 2 DAYS Performed at [facility], [address], [place], [place] [address]    Report Status PENDING  Incomplete     Labs:  CBC: Recent Labs  Lab 12/19/23 0215 12/20/23 0259 12/23/23 1411 12/24/23 0538 12/25/23 0439  WBC 9.4 7.6 18.8* 11.6* 10.6*  NEUTROABS 7.1 4.8  --   --   --   HGB 10.2* 10.0* 11.2* 10.4* 10.8*  HCT 31.0* 30.8* 34.2* 30.4* 32.0*  MCV 86.6 88.8 90.2 86.9 87.9  PLT 356 159 360 312 372   BMP &GFR Recent Labs  Lab 12/19/23 0215 12/20/23 0259 12/23/23 1411 12/24/23 0538 12/25/23 0439  NA 140 142 138 135 135  K 2.9* 4.0 3.3* 3.4* 3.5  CL 109 111 104 101 102  CO2 19* 16* 20* 22 22  GLUCOSE 90 96 126* 110* 111*  BUN 6* 6* 7* 7* 7*  CREATININE 0.41* 0.36* 0.45 0.37* 0.48  CALCIUM  8.4* 8.1* 8.9 8.4* 8.4*  MG 1.5* 2.1  --   --  1.4*  PHOS  --   --   --   --  2.8   Estimated Creatinine Clearance: 49.2 mL/min (by C-G formula based on SCr of 0.48 mg/dL). Liver & Pancreas: Recent Labs  Lab 12/19/23 0215 12/20/23 0259 12/23/23 1411 12/24/23 0538 12/25/23 0439  AST 39 61* 183* 98* 66*  ALT 120* 113* 193* 141*  114*  ALKPHOS 675* 596* 910* 686* 701*  BILITOT 3.0* 2.6* 5.7* 5.5* 5.1*  PROT 4.8* 4.5* 5.8* 4.5* 4.8*  ALBUMIN  2.4* 2.3* 2.8* 2.1* 2.2*   Recent Labs  Lab 12/23/23 1411 12/25/23 0439  LIPASE 43 35   No results for input(s): AMMONIA in the last 168 hours. Diabetic: No results for input(s): HGBA1C in the last 72 hours. No results for input(s): GLUCAP in the last 168 hours. Cardiac Enzymes: No results for input(s): CKTOTAL, CKMB, CKMBINDEX, TROPONINI in the last 168 hours. No results for input(s): PROBNP in the last 8760 hours. Coagulation Profile: No results for input(s): INR, PROTIME in the last 168 hours. Thyroid  Function Tests: No results for input(s): TSH, T4TOTAL, FREET4, T3FREE, THYROIDAB  in the last 72 hours. Lipid Profile: No results for input(s): CHOL, HDL, LDLCALC, TRIG, CHOLHDL, LDLDIRECT in the last 72 hours. Anemia Panel: No results for input(s): VITAMINB12, FOLATE, FERRITIN, TIBC, IRON, RETICCTPCT in the last 72 hours. Urine analysis:    Component Value Date/Time   COLORURINE YELLOW 03/27/2023 1249   APPEARANCEUR HAZY (A) 03/27/2023 1249   LABSPEC 1.024 03/27/2023 1249   PHURINE 5.0 03/27/2023 1249   GLUCOSEU NEGATIVE 03/27/2023 1249   GLUCOSEU NEGATIVE 08/01/2022 1250   HGBUR NEGATIVE 03/27/2023 1249   BILIRUBINUR NEGATIVE 03/27/2023 1249   KETONESUR NEGATIVE 03/27/2023 1249   PROTEINUR NEGATIVE 03/27/2023 1249   UROBILINOGEN 0.2 08/01/2022 1250   NITRITE NEGATIVE 03/27/2023 1249   LEUKOCYTESUR LARGE (A) 03/27/2023 1249   Sepsis Labs: Invalid input(s): PROCALCITONIN, LACTICIDVEN   SIGNED:  Kimela Malstrom T Yul Diana, MD  [facility] 12/25/2023, 2:26 PM

## 2023-12-25 NOTE — TOC Transition Note (Addendum)
 Transition of Care [facility]) - Discharge Note   Patient Details  Name: Natasha Frederick MRN: [id number] Date of Birth: [birth year]  [facility] CM/SW Contact:  Sherline Clack, LCSWA Phone Number: 12/25/2023, 4:13 PM   Clinical Narrative:     Patient will DC to: [facility] Anticipated DC date: 12/25/23  Family notified: left VM for Lewis Lauder/husband Transport by: [facility]   Per MD patient ready for DC to [facility]. RN to call report prior to discharge [phone], [phone]). RN, patient, and facility notified of DC. CSW attempted to call Lewis Glasner/husband twice and left VM. Hospice with [facility] will continue to follow patient at [facility]. Discharge Summary and FL2 sent to facility. DC packet on chart. Ambulance transport requested for patient.   CSW will sign off for now as social work intervention is no longer needed. Please consult us  again if new needs arise.     Final next level of care: Skilled Nursing Facility Barriers to Discharge: Barriers Resolved   Patient Goals and CMS Choice            Discharge Placement              Patient chooses bed at: [facility] Patient to be transferred to facility by: [facility] Name of family member notified: Lewis Poster/husband Patient and family notified of of transfer: 12/25/23  Discharge Plan and Services Additional resources added to the After Visit Summary for                                       Social Drivers of Health (SDOH) Interventions SDOH Screenings   Food Insecurity: Patient Unable To Answer (12/24/2023)  Housing: Unknown (12/24/2023)  Transportation Needs: Patient Unable To Answer (12/24/2023)  Utilities: Patient Unable To Answer (12/24/2023)  Alcohol Screen: Low Risk  (09/26/2022)  Depression (PHQ2-9): Low Risk  (09/26/2022)  Financial Resource Strain: Low Risk  (09/21/2021)  Physical Activity: Insufficiently Active (09/26/2022)  Social Connections: Patient  Unable To Answer (12/24/2023)  Stress: No Stress Concern Present (09/26/2022)  Tobacco Use: Medium Risk (12/23/2023)     Readmission Risk Interventions     No data to display

## 2023-12-25 NOTE — Plan of Care (Signed)

## 2023-12-25 NOTE — Progress Notes (Signed)
 [facility] here for pt transport.  Pt's SO at the bedside.  Pt had supper prior and incont care provided prior to discharge.  Attempted to call report again at [phone].  Name and # left with operator and additional VM left asking for a return call

## 2023-12-25 NOTE — Progress Notes (Signed)
 Attempted to call report to [facility] five times.  VM left on last attempt.  This RN also left her name and call back number with the operator.

## 2023-12-25 NOTE — Progress Notes (Signed)
 Daily Progress Note   Patient Name: Natasha Frederick       Date: 12/25/2023 DOB: 1955/09/28  Age: 68 y.o. MRN#: [id number] Attending Physician: Kathrin Mignon DASEN, MD Primary Care Physician: Micheal Wolm ORN, MD Admit Date: 12/23/2023  Reason for Consultation/Follow-up: Establishing goals of care  Patient Profile/HPI:    68 y.o. female  with past medical history of Huntington disease, dysphagia, DVT, endometriosis, PUD with perforation in 2020, pancreatic mass- adenocarcinoma, CBD s/p stent placement, s/p R BKA,  admitted on 12/23/2023 with fever, vomiting, altered mental status. She was started on broad spectrum antibiotics for sepsis. Palliative medicine consulted fro medical decision making regarding treatment options.    Subjective: Chart reviewed including labs, progress notes, imaging from this and previous encounters.  Plan for discharge today with hospice.  Had episode of agitation overnight and pulled out her IV. She was able to self soothe.  On eval patient is sleeping- does not arouse.  No prn medications over last 24 hours.   Review of Systems  Unable to perform ROS: Mental status change     Physical Exam Vitals and nursing note reviewed.  Constitutional:      Appearance: She is ill-appearing.     Comments: frail  Cardiovascular:     Rate and Rhythm: Normal rate.  Neurological:     Comments: sleeping             Vital Signs: BP 119/86 (BP Location: Left Arm)   Pulse (!) 59   Temp 98.1 F (36.7 C)   Resp 14   SpO2 95%  SpO2: SpO2: 95 % O2 Device: O2 Device: Room Air O2 Flow Rate:    Intake/output summary:  Intake/Output Summary (Last 24 hours) at 12/25/2023 1357 Last data filed at 12/25/2023 0457 Gross per 24 hour  Intake 413.14 ml  Output --  Net 413.14 ml   LBM:  Last BM Date : 12/24/23 Baseline Weight:   Most recent weight:         Palliative Assessment/Data: PPS: 10%      Patient Active Problem List   Diagnosis Date Noted   Severe sepsis (HCC) 12/23/2023   Obstipation 12/23/2023   Prolonged QT interval 12/23/2023   Duodenitis 12/17/2023   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   Pancreatic mass 12/16/2023   Obstructive jaundice 12/16/2023  Abscess 12/16/2023   Thrombocytosis 12/16/2023   Hx of below knee amputation, right (HCC) 12/16/2023   GERD (gastroesophageal reflux disease) 11/27/2021   Osteoporosis 11/23/2021   Depression, recurrent (HCC) 05/12/2019   Below-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) 01/26/2019   Anxiety and depression 12/21/2018   Chronic back pain 11/05/2018   Cold right foot 08/06/2018   Delirium 08/01/2018   Perforated gastric ulcer s/p omental patch 07/30/2018 07/31/2018   ARF (acute renal failure) (HCC) 07/30/2018   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   Primary osteoarthritis of left hip 07/19/2014   Scoliosis of lumbar spine 01/05/2014    Palliative Care Assessment & Plan    Assessment/Recommendations/Plan  Continue current interventions, treat what is treatable, plan to discharge back to memory care with hospice   Code Status:   Code Status: Limited: Do not attempt resuscitation (DNR) -DNR-LIMITED -Do Not Intubate/DNI    Prognosis:  < 6 months  Discharge Planning: Home with Hospice    Thank you for allowing the Palliative Medicine Team to assist in the care of this patient.  Total time:  Prolonged billing:  Time includes:   Preparing to see the patient (e.g., review of tests) Obtaining and/or reviewing separately obtained history Performing a medically necessary appropriate examination and/or evaluation Counseling and educating the patient/family/caregiver Ordering medications, tests, or procedures Referring and communicating with other health care professionals (when not reported  separately) Documenting clinical information in the electronic or other health record Independently interpreting results (not reported separately) and communicating results to the patient/family/caregiver Care coordination (not reported separately) Clinical documentation  Cassondra Stain, AGNP-C Palliative Medicine   Please contact Palliative Medicine Team phone at [phone] for questions and concerns.

## 2023-12-26 DIAGNOSIS — F411 Generalized anxiety disorder: Secondary | ICD-10-CM | POA: Diagnosis not present

## 2023-12-26 DIAGNOSIS — N182 Chronic kidney disease, stage 2 (mild): Secondary | ICD-10-CM | POA: Diagnosis not present

## 2023-12-27 DIAGNOSIS — G10 Huntington's disease: Secondary | ICD-10-CM | POA: Diagnosis not present

## 2023-12-27 DIAGNOSIS — F0390 Unspecified dementia without behavioral disturbance: Secondary | ICD-10-CM | POA: Diagnosis not present

## 2023-12-27 DIAGNOSIS — F331 Major depressive disorder, recurrent, moderate: Secondary | ICD-10-CM | POA: Diagnosis not present

## 2023-12-27 DIAGNOSIS — F411 Generalized anxiety disorder: Secondary | ICD-10-CM | POA: Diagnosis not present

## 2023-12-28 LAB — CULTURE, BLOOD (ROUTINE X 2)
Culture: NO GROWTH
Culture: NO GROWTH

## 2023-12-31 DIAGNOSIS — G10 Huntington's disease: Secondary | ICD-10-CM | POA: Diagnosis not present

## 2023-12-31 DIAGNOSIS — Z89511 Acquired absence of right leg below knee: Secondary | ICD-10-CM | POA: Diagnosis not present

## 2023-12-31 DIAGNOSIS — Z515 Encounter for palliative care: Secondary | ICD-10-CM | POA: Diagnosis not present

## 2023-12-31 DIAGNOSIS — C259 Malignant neoplasm of pancreas, unspecified: Secondary | ICD-10-CM | POA: Diagnosis not present

## 2023-12-31 DIAGNOSIS — A419 Sepsis, unspecified organism: Secondary | ICD-10-CM | POA: Diagnosis not present

## 2023-12-31 DIAGNOSIS — K5900 Constipation, unspecified: Secondary | ICD-10-CM | POA: Diagnosis not present

## 2023-12-31 DIAGNOSIS — K8309 Other cholangitis: Secondary | ICD-10-CM | POA: Diagnosis not present

## 2023-12-31 DIAGNOSIS — E876 Hypokalemia: Secondary | ICD-10-CM | POA: Diagnosis not present

## 2023-12-31 DIAGNOSIS — F0390 Unspecified dementia without behavioral disturbance: Secondary | ICD-10-CM | POA: Diagnosis not present

## 2024-01-01 DIAGNOSIS — H04129 Dry eye syndrome of unspecified lacrimal gland: Secondary | ICD-10-CM | POA: Diagnosis not present

## 2024-01-01 DIAGNOSIS — K219 Gastro-esophageal reflux disease without esophagitis: Secondary | ICD-10-CM | POA: Diagnosis not present

## 2024-01-01 DIAGNOSIS — F32A Depression, unspecified: Secondary | ICD-10-CM | POA: Diagnosis not present

## 2024-01-01 DIAGNOSIS — C7801 Secondary malignant neoplasm of right lung: Secondary | ICD-10-CM | POA: Diagnosis not present

## 2024-01-01 DIAGNOSIS — F419 Anxiety disorder, unspecified: Secondary | ICD-10-CM | POA: Diagnosis not present

## 2024-01-01 DIAGNOSIS — M8580 Other specified disorders of bone density and structure, unspecified site: Secondary | ICD-10-CM | POA: Diagnosis not present

## 2024-01-01 DIAGNOSIS — C259 Malignant neoplasm of pancreas, unspecified: Secondary | ICD-10-CM | POA: Diagnosis not present

## 2024-01-01 DIAGNOSIS — G10 Huntington's disease: Secondary | ICD-10-CM | POA: Diagnosis not present

## 2024-01-01 DIAGNOSIS — R1310 Dysphagia, unspecified: Secondary | ICD-10-CM | POA: Diagnosis not present

## 2024-01-02 DIAGNOSIS — C259 Malignant neoplasm of pancreas, unspecified: Secondary | ICD-10-CM | POA: Diagnosis not present

## 2024-01-02 DIAGNOSIS — G10 Huntington's disease: Secondary | ICD-10-CM | POA: Diagnosis not present

## 2024-01-02 DIAGNOSIS — K219 Gastro-esophageal reflux disease without esophagitis: Secondary | ICD-10-CM | POA: Diagnosis not present

## 2024-01-02 DIAGNOSIS — F419 Anxiety disorder, unspecified: Secondary | ICD-10-CM | POA: Diagnosis not present

## 2024-01-02 DIAGNOSIS — C7801 Secondary malignant neoplasm of right lung: Secondary | ICD-10-CM | POA: Diagnosis not present

## 2024-01-02 DIAGNOSIS — F32A Depression, unspecified: Secondary | ICD-10-CM | POA: Diagnosis not present

## 2024-01-06 DIAGNOSIS — G10 Huntington's disease: Secondary | ICD-10-CM | POA: Diagnosis not present

## 2024-01-06 DIAGNOSIS — C7801 Secondary malignant neoplasm of right lung: Secondary | ICD-10-CM | POA: Diagnosis not present

## 2024-01-06 DIAGNOSIS — F419 Anxiety disorder, unspecified: Secondary | ICD-10-CM | POA: Diagnosis not present

## 2024-01-06 DIAGNOSIS — C259 Malignant neoplasm of pancreas, unspecified: Secondary | ICD-10-CM | POA: Diagnosis not present

## 2024-01-06 DIAGNOSIS — F32A Depression, unspecified: Secondary | ICD-10-CM | POA: Diagnosis not present

## 2024-01-06 DIAGNOSIS — K219 Gastro-esophageal reflux disease without esophagitis: Secondary | ICD-10-CM | POA: Diagnosis not present

## 2024-01-08 DIAGNOSIS — F32A Depression, unspecified: Secondary | ICD-10-CM | POA: Diagnosis not present

## 2024-01-08 DIAGNOSIS — R1310 Dysphagia, unspecified: Secondary | ICD-10-CM | POA: Diagnosis not present

## 2024-01-08 DIAGNOSIS — M8580 Other specified disorders of bone density and structure, unspecified site: Secondary | ICD-10-CM | POA: Diagnosis not present

## 2024-01-08 DIAGNOSIS — C7801 Secondary malignant neoplasm of right lung: Secondary | ICD-10-CM | POA: Diagnosis not present

## 2024-01-08 DIAGNOSIS — G10 Huntington's disease: Secondary | ICD-10-CM | POA: Diagnosis not present

## 2024-01-08 DIAGNOSIS — F419 Anxiety disorder, unspecified: Secondary | ICD-10-CM | POA: Diagnosis not present

## 2024-01-08 DIAGNOSIS — H04129 Dry eye syndrome of unspecified lacrimal gland: Secondary | ICD-10-CM | POA: Diagnosis not present

## 2024-01-08 DIAGNOSIS — K219 Gastro-esophageal reflux disease without esophagitis: Secondary | ICD-10-CM | POA: Diagnosis not present

## 2024-01-08 DIAGNOSIS — C259 Malignant neoplasm of pancreas, unspecified: Secondary | ICD-10-CM | POA: Diagnosis not present

## 2024-01-09 DIAGNOSIS — K219 Gastro-esophageal reflux disease without esophagitis: Secondary | ICD-10-CM | POA: Diagnosis not present

## 2024-01-09 DIAGNOSIS — F419 Anxiety disorder, unspecified: Secondary | ICD-10-CM | POA: Diagnosis not present

## 2024-01-09 DIAGNOSIS — G10 Huntington's disease: Secondary | ICD-10-CM | POA: Diagnosis not present

## 2024-01-09 DIAGNOSIS — C259 Malignant neoplasm of pancreas, unspecified: Secondary | ICD-10-CM | POA: Diagnosis not present

## 2024-01-09 DIAGNOSIS — C7801 Secondary malignant neoplasm of right lung: Secondary | ICD-10-CM | POA: Diagnosis not present

## 2024-01-09 DIAGNOSIS — F32A Depression, unspecified: Secondary | ICD-10-CM | POA: Diagnosis not present

## 2024-01-10 DIAGNOSIS — C259 Malignant neoplasm of pancreas, unspecified: Secondary | ICD-10-CM | POA: Diagnosis not present

## 2024-01-10 DIAGNOSIS — C7801 Secondary malignant neoplasm of right lung: Secondary | ICD-10-CM | POA: Diagnosis not present

## 2024-01-10 DIAGNOSIS — F419 Anxiety disorder, unspecified: Secondary | ICD-10-CM | POA: Diagnosis not present

## 2024-01-10 DIAGNOSIS — G10 Huntington's disease: Secondary | ICD-10-CM | POA: Diagnosis not present

## 2024-01-10 DIAGNOSIS — K219 Gastro-esophageal reflux disease without esophagitis: Secondary | ICD-10-CM | POA: Diagnosis not present

## 2024-01-10 DIAGNOSIS — F32A Depression, unspecified: Secondary | ICD-10-CM | POA: Diagnosis not present

## 2024-01-11 DIAGNOSIS — G10 Huntington's disease: Secondary | ICD-10-CM | POA: Diagnosis not present

## 2024-01-11 DIAGNOSIS — C259 Malignant neoplasm of pancreas, unspecified: Secondary | ICD-10-CM | POA: Diagnosis not present

## 2024-01-11 DIAGNOSIS — C7801 Secondary malignant neoplasm of right lung: Secondary | ICD-10-CM | POA: Diagnosis not present

## 2024-01-11 DIAGNOSIS — F32A Depression, unspecified: Secondary | ICD-10-CM | POA: Diagnosis not present

## 2024-01-11 DIAGNOSIS — K219 Gastro-esophageal reflux disease without esophagitis: Secondary | ICD-10-CM | POA: Diagnosis not present

## 2024-01-11 DIAGNOSIS — F419 Anxiety disorder, unspecified: Secondary | ICD-10-CM | POA: Diagnosis not present

## 2024-01-13 DIAGNOSIS — F32A Depression, unspecified: Secondary | ICD-10-CM | POA: Diagnosis not present

## 2024-01-13 DIAGNOSIS — K219 Gastro-esophageal reflux disease without esophagitis: Secondary | ICD-10-CM | POA: Diagnosis not present

## 2024-01-13 DIAGNOSIS — C7801 Secondary malignant neoplasm of right lung: Secondary | ICD-10-CM | POA: Diagnosis not present

## 2024-01-13 DIAGNOSIS — F419 Anxiety disorder, unspecified: Secondary | ICD-10-CM | POA: Diagnosis not present

## 2024-01-13 DIAGNOSIS — G10 Huntington's disease: Secondary | ICD-10-CM | POA: Diagnosis not present

## 2024-01-13 DIAGNOSIS — C259 Malignant neoplasm of pancreas, unspecified: Secondary | ICD-10-CM | POA: Diagnosis not present

## 2024-01-14 DIAGNOSIS — C7801 Secondary malignant neoplasm of right lung: Secondary | ICD-10-CM | POA: Diagnosis not present

## 2024-01-14 DIAGNOSIS — K219 Gastro-esophageal reflux disease without esophagitis: Secondary | ICD-10-CM | POA: Diagnosis not present

## 2024-01-14 DIAGNOSIS — G10 Huntington's disease: Secondary | ICD-10-CM | POA: Diagnosis not present

## 2024-01-14 DIAGNOSIS — F419 Anxiety disorder, unspecified: Secondary | ICD-10-CM | POA: Diagnosis not present

## 2024-01-14 DIAGNOSIS — C259 Malignant neoplasm of pancreas, unspecified: Secondary | ICD-10-CM | POA: Diagnosis not present

## 2024-01-14 DIAGNOSIS — F32A Depression, unspecified: Secondary | ICD-10-CM | POA: Diagnosis not present

## 2024-01-16 DIAGNOSIS — G10 Huntington's disease: Secondary | ICD-10-CM | POA: Diagnosis not present

## 2024-01-16 DIAGNOSIS — Z89511 Acquired absence of right leg below knee: Secondary | ICD-10-CM | POA: Diagnosis not present

## 2024-01-16 DIAGNOSIS — K219 Gastro-esophageal reflux disease without esophagitis: Secondary | ICD-10-CM | POA: Diagnosis not present

## 2024-01-16 DIAGNOSIS — F419 Anxiety disorder, unspecified: Secondary | ICD-10-CM | POA: Diagnosis not present

## 2024-01-16 DIAGNOSIS — Z515 Encounter for palliative care: Secondary | ICD-10-CM | POA: Diagnosis not present

## 2024-01-16 DIAGNOSIS — C7801 Secondary malignant neoplasm of right lung: Secondary | ICD-10-CM | POA: Diagnosis not present

## 2024-01-16 DIAGNOSIS — R17 Unspecified jaundice: Secondary | ICD-10-CM | POA: Diagnosis not present

## 2024-01-16 DIAGNOSIS — C259 Malignant neoplasm of pancreas, unspecified: Secondary | ICD-10-CM | POA: Diagnosis not present

## 2024-01-16 DIAGNOSIS — F32A Depression, unspecified: Secondary | ICD-10-CM | POA: Diagnosis not present

## 2024-01-21 DIAGNOSIS — G10 Huntington's disease: Secondary | ICD-10-CM | POA: Diagnosis not present

## 2024-01-21 DIAGNOSIS — C7801 Secondary malignant neoplasm of right lung: Secondary | ICD-10-CM | POA: Diagnosis not present

## 2024-01-21 DIAGNOSIS — F419 Anxiety disorder, unspecified: Secondary | ICD-10-CM | POA: Diagnosis not present

## 2024-01-21 DIAGNOSIS — K219 Gastro-esophageal reflux disease without esophagitis: Secondary | ICD-10-CM | POA: Diagnosis not present

## 2024-01-21 DIAGNOSIS — C259 Malignant neoplasm of pancreas, unspecified: Secondary | ICD-10-CM | POA: Diagnosis not present

## 2024-01-21 DIAGNOSIS — F32A Depression, unspecified: Secondary | ICD-10-CM | POA: Diagnosis not present

## 2024-01-23 DIAGNOSIS — F419 Anxiety disorder, unspecified: Secondary | ICD-10-CM | POA: Diagnosis not present

## 2024-01-23 DIAGNOSIS — C259 Malignant neoplasm of pancreas, unspecified: Secondary | ICD-10-CM | POA: Diagnosis not present

## 2024-01-23 DIAGNOSIS — F32A Depression, unspecified: Secondary | ICD-10-CM | POA: Diagnosis not present

## 2024-01-23 DIAGNOSIS — C7801 Secondary malignant neoplasm of right lung: Secondary | ICD-10-CM | POA: Diagnosis not present

## 2024-01-23 DIAGNOSIS — G10 Huntington's disease: Secondary | ICD-10-CM | POA: Diagnosis not present

## 2024-01-23 DIAGNOSIS — K219 Gastro-esophageal reflux disease without esophagitis: Secondary | ICD-10-CM | POA: Diagnosis not present

## 2024-01-24 DIAGNOSIS — F0390 Unspecified dementia without behavioral disturbance: Secondary | ICD-10-CM | POA: Diagnosis not present

## 2024-01-24 DIAGNOSIS — K219 Gastro-esophageal reflux disease without esophagitis: Secondary | ICD-10-CM | POA: Diagnosis not present

## 2024-01-24 DIAGNOSIS — F419 Anxiety disorder, unspecified: Secondary | ICD-10-CM | POA: Diagnosis not present

## 2024-01-24 DIAGNOSIS — F32A Depression, unspecified: Secondary | ICD-10-CM | POA: Diagnosis not present

## 2024-01-24 DIAGNOSIS — C259 Malignant neoplasm of pancreas, unspecified: Secondary | ICD-10-CM | POA: Diagnosis not present

## 2024-01-24 DIAGNOSIS — F331 Major depressive disorder, recurrent, moderate: Secondary | ICD-10-CM | POA: Diagnosis not present

## 2024-01-24 DIAGNOSIS — C7801 Secondary malignant neoplasm of right lung: Secondary | ICD-10-CM | POA: Diagnosis not present

## 2024-01-24 DIAGNOSIS — G10 Huntington's disease: Secondary | ICD-10-CM | POA: Diagnosis not present

## 2024-01-24 DIAGNOSIS — F411 Generalized anxiety disorder: Secondary | ICD-10-CM | POA: Diagnosis not present

## 2024-01-25 DIAGNOSIS — G10 Huntington's disease: Secondary | ICD-10-CM | POA: Diagnosis not present

## 2024-01-25 DIAGNOSIS — F32A Depression, unspecified: Secondary | ICD-10-CM | POA: Diagnosis not present

## 2024-01-25 DIAGNOSIS — F419 Anxiety disorder, unspecified: Secondary | ICD-10-CM | POA: Diagnosis not present

## 2024-01-25 DIAGNOSIS — C259 Malignant neoplasm of pancreas, unspecified: Secondary | ICD-10-CM | POA: Diagnosis not present

## 2024-01-25 DIAGNOSIS — K219 Gastro-esophageal reflux disease without esophagitis: Secondary | ICD-10-CM | POA: Diagnosis not present

## 2024-01-25 DIAGNOSIS — C7801 Secondary malignant neoplasm of right lung: Secondary | ICD-10-CM | POA: Diagnosis not present

## 2024-01-26 DIAGNOSIS — K219 Gastro-esophageal reflux disease without esophagitis: Secondary | ICD-10-CM | POA: Diagnosis not present

## 2024-01-26 DIAGNOSIS — C259 Malignant neoplasm of pancreas, unspecified: Secondary | ICD-10-CM | POA: Diagnosis not present

## 2024-01-26 DIAGNOSIS — G10 Huntington's disease: Secondary | ICD-10-CM | POA: Diagnosis not present

## 2024-01-26 DIAGNOSIS — F419 Anxiety disorder, unspecified: Secondary | ICD-10-CM | POA: Diagnosis not present

## 2024-01-26 DIAGNOSIS — F32A Depression, unspecified: Secondary | ICD-10-CM | POA: Diagnosis not present

## 2024-01-26 DIAGNOSIS — C7801 Secondary malignant neoplasm of right lung: Secondary | ICD-10-CM | POA: Diagnosis not present

## 2024-01-27 DIAGNOSIS — K219 Gastro-esophageal reflux disease without esophagitis: Secondary | ICD-10-CM | POA: Diagnosis not present

## 2024-01-27 DIAGNOSIS — F32A Depression, unspecified: Secondary | ICD-10-CM | POA: Diagnosis not present

## 2024-01-27 DIAGNOSIS — C259 Malignant neoplasm of pancreas, unspecified: Secondary | ICD-10-CM | POA: Diagnosis not present

## 2024-01-27 DIAGNOSIS — G10 Huntington's disease: Secondary | ICD-10-CM | POA: Diagnosis not present

## 2024-01-27 DIAGNOSIS — C7801 Secondary malignant neoplasm of right lung: Secondary | ICD-10-CM | POA: Diagnosis not present

## 2024-01-27 DIAGNOSIS — F419 Anxiety disorder, unspecified: Secondary | ICD-10-CM | POA: Diagnosis not present

## 2024-01-28 DIAGNOSIS — F419 Anxiety disorder, unspecified: Secondary | ICD-10-CM | POA: Diagnosis not present

## 2024-01-28 DIAGNOSIS — F32A Depression, unspecified: Secondary | ICD-10-CM | POA: Diagnosis not present

## 2024-01-28 DIAGNOSIS — C259 Malignant neoplasm of pancreas, unspecified: Secondary | ICD-10-CM | POA: Diagnosis not present

## 2024-01-28 DIAGNOSIS — K219 Gastro-esophageal reflux disease without esophagitis: Secondary | ICD-10-CM | POA: Diagnosis not present

## 2024-01-28 DIAGNOSIS — G10 Huntington's disease: Secondary | ICD-10-CM | POA: Diagnosis not present

## 2024-01-28 DIAGNOSIS — C7801 Secondary malignant neoplasm of right lung: Secondary | ICD-10-CM | POA: Diagnosis not present

## 2024-01-29 DIAGNOSIS — F419 Anxiety disorder, unspecified: Secondary | ICD-10-CM | POA: Diagnosis not present

## 2024-01-29 DIAGNOSIS — F32A Depression, unspecified: Secondary | ICD-10-CM | POA: Diagnosis not present

## 2024-01-29 DIAGNOSIS — C259 Malignant neoplasm of pancreas, unspecified: Secondary | ICD-10-CM | POA: Diagnosis not present

## 2024-01-29 DIAGNOSIS — C7801 Secondary malignant neoplasm of right lung: Secondary | ICD-10-CM | POA: Diagnosis not present

## 2024-01-29 DIAGNOSIS — G10 Huntington's disease: Secondary | ICD-10-CM | POA: Diagnosis not present

## 2024-01-29 DIAGNOSIS — K219 Gastro-esophageal reflux disease without esophagitis: Secondary | ICD-10-CM | POA: Diagnosis not present

## 2024-01-29 DIAGNOSIS — Z23 Encounter for immunization: Secondary | ICD-10-CM | POA: Diagnosis not present

## 2024-01-30 DIAGNOSIS — C259 Malignant neoplasm of pancreas, unspecified: Secondary | ICD-10-CM | POA: Diagnosis not present

## 2024-01-30 DIAGNOSIS — G10 Huntington's disease: Secondary | ICD-10-CM | POA: Diagnosis not present

## 2024-01-30 DIAGNOSIS — K219 Gastro-esophageal reflux disease without esophagitis: Secondary | ICD-10-CM | POA: Diagnosis not present

## 2024-01-30 DIAGNOSIS — C7801 Secondary malignant neoplasm of right lung: Secondary | ICD-10-CM | POA: Diagnosis not present

## 2024-01-30 DIAGNOSIS — F419 Anxiety disorder, unspecified: Secondary | ICD-10-CM | POA: Diagnosis not present

## 2024-01-30 DIAGNOSIS — F32A Depression, unspecified: Secondary | ICD-10-CM | POA: Diagnosis not present

## 2024-01-31 DIAGNOSIS — F419 Anxiety disorder, unspecified: Secondary | ICD-10-CM | POA: Diagnosis not present

## 2024-01-31 DIAGNOSIS — K219 Gastro-esophageal reflux disease without esophagitis: Secondary | ICD-10-CM | POA: Diagnosis not present

## 2024-01-31 DIAGNOSIS — C259 Malignant neoplasm of pancreas, unspecified: Secondary | ICD-10-CM | POA: Diagnosis not present

## 2024-01-31 DIAGNOSIS — C7801 Secondary malignant neoplasm of right lung: Secondary | ICD-10-CM | POA: Diagnosis not present

## 2024-01-31 DIAGNOSIS — F32A Depression, unspecified: Secondary | ICD-10-CM | POA: Diagnosis not present

## 2024-01-31 DIAGNOSIS — G10 Huntington's disease: Secondary | ICD-10-CM | POA: Diagnosis not present

## 2024-02-01 DIAGNOSIS — F419 Anxiety disorder, unspecified: Secondary | ICD-10-CM | POA: Diagnosis not present

## 2024-02-01 DIAGNOSIS — C259 Malignant neoplasm of pancreas, unspecified: Secondary | ICD-10-CM | POA: Diagnosis not present

## 2024-02-01 DIAGNOSIS — C7801 Secondary malignant neoplasm of right lung: Secondary | ICD-10-CM | POA: Diagnosis not present

## 2024-02-01 DIAGNOSIS — K219 Gastro-esophageal reflux disease without esophagitis: Secondary | ICD-10-CM | POA: Diagnosis not present

## 2024-02-01 DIAGNOSIS — G10 Huntington's disease: Secondary | ICD-10-CM | POA: Diagnosis not present

## 2024-02-01 DIAGNOSIS — F32A Depression, unspecified: Secondary | ICD-10-CM | POA: Diagnosis not present

## 2024-02-03 DIAGNOSIS — K219 Gastro-esophageal reflux disease without esophagitis: Secondary | ICD-10-CM | POA: Diagnosis not present

## 2024-02-03 DIAGNOSIS — C259 Malignant neoplasm of pancreas, unspecified: Secondary | ICD-10-CM | POA: Diagnosis not present

## 2024-02-03 DIAGNOSIS — C7801 Secondary malignant neoplasm of right lung: Secondary | ICD-10-CM | POA: Diagnosis not present

## 2024-02-03 DIAGNOSIS — F32A Depression, unspecified: Secondary | ICD-10-CM | POA: Diagnosis not present

## 2024-02-03 DIAGNOSIS — F419 Anxiety disorder, unspecified: Secondary | ICD-10-CM | POA: Diagnosis not present

## 2024-02-03 DIAGNOSIS — G10 Huntington's disease: Secondary | ICD-10-CM | POA: Diagnosis not present

## 2024-02-04 DIAGNOSIS — F32A Depression, unspecified: Secondary | ICD-10-CM | POA: Diagnosis not present

## 2024-02-04 DIAGNOSIS — C7801 Secondary malignant neoplasm of right lung: Secondary | ICD-10-CM | POA: Diagnosis not present

## 2024-02-04 DIAGNOSIS — K219 Gastro-esophageal reflux disease without esophagitis: Secondary | ICD-10-CM | POA: Diagnosis not present

## 2024-02-04 DIAGNOSIS — F419 Anxiety disorder, unspecified: Secondary | ICD-10-CM | POA: Diagnosis not present

## 2024-02-04 DIAGNOSIS — G10 Huntington's disease: Secondary | ICD-10-CM | POA: Diagnosis not present

## 2024-02-04 DIAGNOSIS — C259 Malignant neoplasm of pancreas, unspecified: Secondary | ICD-10-CM | POA: Diagnosis not present

## 2024-02-05 DIAGNOSIS — C259 Malignant neoplasm of pancreas, unspecified: Secondary | ICD-10-CM | POA: Diagnosis not present

## 2024-02-05 DIAGNOSIS — C7801 Secondary malignant neoplasm of right lung: Secondary | ICD-10-CM | POA: Diagnosis not present

## 2024-02-05 DIAGNOSIS — G10 Huntington's disease: Secondary | ICD-10-CM | POA: Diagnosis not present

## 2024-02-05 DIAGNOSIS — F32A Depression, unspecified: Secondary | ICD-10-CM | POA: Diagnosis not present

## 2024-02-05 DIAGNOSIS — K219 Gastro-esophageal reflux disease without esophagitis: Secondary | ICD-10-CM | POA: Diagnosis not present

## 2024-02-05 DIAGNOSIS — F419 Anxiety disorder, unspecified: Secondary | ICD-10-CM | POA: Diagnosis not present

## 2024-02-06 DIAGNOSIS — C7801 Secondary malignant neoplasm of right lung: Secondary | ICD-10-CM | POA: Diagnosis not present

## 2024-02-06 DIAGNOSIS — C259 Malignant neoplasm of pancreas, unspecified: Secondary | ICD-10-CM | POA: Diagnosis not present

## 2024-02-06 DIAGNOSIS — F32A Depression, unspecified: Secondary | ICD-10-CM | POA: Diagnosis not present

## 2024-02-06 DIAGNOSIS — F419 Anxiety disorder, unspecified: Secondary | ICD-10-CM | POA: Diagnosis not present

## 2024-02-06 DIAGNOSIS — K219 Gastro-esophageal reflux disease without esophagitis: Secondary | ICD-10-CM | POA: Diagnosis not present

## 2024-02-06 DIAGNOSIS — G10 Huntington's disease: Secondary | ICD-10-CM | POA: Diagnosis not present

## 2024-02-07 DIAGNOSIS — C259 Malignant neoplasm of pancreas, unspecified: Secondary | ICD-10-CM | POA: Diagnosis not present

## 2024-02-07 DIAGNOSIS — K219 Gastro-esophageal reflux disease without esophagitis: Secondary | ICD-10-CM | POA: Diagnosis not present

## 2024-02-07 DIAGNOSIS — F32A Depression, unspecified: Secondary | ICD-10-CM | POA: Diagnosis not present

## 2024-02-07 DIAGNOSIS — F419 Anxiety disorder, unspecified: Secondary | ICD-10-CM | POA: Diagnosis not present

## 2024-02-07 DIAGNOSIS — C7801 Secondary malignant neoplasm of right lung: Secondary | ICD-10-CM | POA: Diagnosis not present

## 2024-02-07 DIAGNOSIS — G10 Huntington's disease: Secondary | ICD-10-CM | POA: Diagnosis not present

## 2024-02-08 DIAGNOSIS — R1310 Dysphagia, unspecified: Secondary | ICD-10-CM | POA: Diagnosis not present

## 2024-02-08 DIAGNOSIS — F32A Depression, unspecified: Secondary | ICD-10-CM | POA: Diagnosis not present

## 2024-02-08 DIAGNOSIS — M8580 Other specified disorders of bone density and structure, unspecified site: Secondary | ICD-10-CM | POA: Diagnosis not present

## 2024-02-08 DIAGNOSIS — H04129 Dry eye syndrome of unspecified lacrimal gland: Secondary | ICD-10-CM | POA: Diagnosis not present

## 2024-02-08 DIAGNOSIS — K219 Gastro-esophageal reflux disease without esophagitis: Secondary | ICD-10-CM | POA: Diagnosis not present

## 2024-02-08 DIAGNOSIS — C7801 Secondary malignant neoplasm of right lung: Secondary | ICD-10-CM | POA: Diagnosis not present

## 2024-02-08 DIAGNOSIS — G10 Huntington's disease: Secondary | ICD-10-CM | POA: Diagnosis not present

## 2024-02-08 DIAGNOSIS — C259 Malignant neoplasm of pancreas, unspecified: Secondary | ICD-10-CM | POA: Diagnosis not present

## 2024-02-08 DIAGNOSIS — F419 Anxiety disorder, unspecified: Secondary | ICD-10-CM | POA: Diagnosis not present

## 2024-02-09 DIAGNOSIS — K219 Gastro-esophageal reflux disease without esophagitis: Secondary | ICD-10-CM | POA: Diagnosis not present

## 2024-02-09 DIAGNOSIS — F419 Anxiety disorder, unspecified: Secondary | ICD-10-CM | POA: Diagnosis not present

## 2024-02-09 DIAGNOSIS — G10 Huntington's disease: Secondary | ICD-10-CM | POA: Diagnosis not present

## 2024-02-09 DIAGNOSIS — F32A Depression, unspecified: Secondary | ICD-10-CM | POA: Diagnosis not present

## 2024-02-09 DIAGNOSIS — C7801 Secondary malignant neoplasm of right lung: Secondary | ICD-10-CM | POA: Diagnosis not present

## 2024-02-09 DIAGNOSIS — C259 Malignant neoplasm of pancreas, unspecified: Secondary | ICD-10-CM | POA: Diagnosis not present

## 2024-02-10 DIAGNOSIS — K219 Gastro-esophageal reflux disease without esophagitis: Secondary | ICD-10-CM | POA: Diagnosis not present

## 2024-02-10 DIAGNOSIS — C7801 Secondary malignant neoplasm of right lung: Secondary | ICD-10-CM | POA: Diagnosis not present

## 2024-02-10 DIAGNOSIS — F419 Anxiety disorder, unspecified: Secondary | ICD-10-CM | POA: Diagnosis not present

## 2024-02-10 DIAGNOSIS — G10 Huntington's disease: Secondary | ICD-10-CM | POA: Diagnosis not present

## 2024-02-10 DIAGNOSIS — F32A Depression, unspecified: Secondary | ICD-10-CM | POA: Diagnosis not present

## 2024-02-10 DIAGNOSIS — C259 Malignant neoplasm of pancreas, unspecified: Secondary | ICD-10-CM | POA: Diagnosis not present

## 2024-03-09 DEATH — deceased
